# Patient Record
Sex: Female | Born: 1945 | Race: White | Hispanic: No | Smoking: Former smoker
Health system: Southern US, Community
[De-identification: ages and names within clinical notes are randomized; demographics above are authoritative.]

## PROBLEM LIST (undated history)

## (undated) DIAGNOSIS — S92909A Unspecified fracture of unspecified foot, initial encounter for closed fracture: Secondary | ICD-10-CM

## (undated) DIAGNOSIS — I10 Essential (primary) hypertension: Secondary | ICD-10-CM

## (undated) DIAGNOSIS — Z8759 Personal history of other complications of pregnancy, childbirth and the puerperium: Secondary | ICD-10-CM

## (undated) DIAGNOSIS — K635 Polyp of colon: Secondary | ICD-10-CM

## (undated) DIAGNOSIS — B159 Hepatitis A without hepatic coma: Secondary | ICD-10-CM

## (undated) DIAGNOSIS — M1990 Unspecified osteoarthritis, unspecified site: Secondary | ICD-10-CM

## (undated) DIAGNOSIS — C9000 Multiple myeloma not having achieved remission: Secondary | ICD-10-CM

## (undated) DIAGNOSIS — K219 Gastro-esophageal reflux disease without esophagitis: Secondary | ICD-10-CM

## (undated) DIAGNOSIS — T7840XA Allergy, unspecified, initial encounter: Secondary | ICD-10-CM

## (undated) DIAGNOSIS — H269 Unspecified cataract: Secondary | ICD-10-CM

## (undated) DIAGNOSIS — E871 Hypo-osmolality and hyponatremia: Secondary | ICD-10-CM

## (undated) DIAGNOSIS — G4700 Insomnia, unspecified: Secondary | ICD-10-CM

## (undated) HISTORY — DX: Unspecified cataract: H26.9

## (undated) HISTORY — DX: Polyp of colon: K63.5

## (undated) HISTORY — DX: Insomnia, unspecified: G47.00

## (undated) HISTORY — PX: COLONOSCOPY: SHX174

## (undated) HISTORY — DX: Hepatitis a without hepatic coma: B15.9

## (undated) HISTORY — DX: Unspecified osteoarthritis, unspecified site: M19.90

## (undated) HISTORY — DX: Essential (primary) hypertension: I10

## (undated) HISTORY — DX: Unspecified fracture of unspecified foot, initial encounter for closed fracture: S92.909A

## (undated) HISTORY — DX: Gastro-esophageal reflux disease without esophagitis: K21.9

## (undated) HISTORY — DX: Personal history of other complications of pregnancy, childbirth and the puerperium: Z87.59

## (undated) HISTORY — DX: Allergy, unspecified, initial encounter: T78.40XA

## (undated) HISTORY — DX: Hypo-osmolality and hyponatremia: E87.1

---
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
## 1988-12-24 LAB — CONVERTED CEMR LAB: Pap Smear: NORMAL

## 1989-12-24 HISTORY — PX: ABDOMINAL HYSTERECTOMY: SHX81

## 2001-12-24 HISTORY — PX: CHOLECYSTECTOMY: SHX55

## 2008-11-30 ENCOUNTER — Encounter: Payer: Self-pay | Admitting: Family Medicine

## 2008-12-24 HISTORY — PX: KYPHOPLASTY: SHX5884

## 2009-03-16 ENCOUNTER — Encounter: Payer: Self-pay | Admitting: Family Medicine

## 2009-12-24 HISTORY — PX: FOOT FRACTURE SURGERY: SHX645

## 2009-12-24 LAB — HM COLONOSCOPY

## 2009-12-29 ENCOUNTER — Encounter: Payer: Self-pay | Admitting: Family Medicine

## 2010-07-26 ENCOUNTER — Ambulatory Visit (HOSPITAL_COMMUNITY): Admission: RE | Admit: 2010-07-26 | Discharge: 2010-07-27 | Payer: Self-pay | Admitting: Orthopedic Surgery

## 2010-07-26 IMAGING — CR DG CHEST 2V
2 series · 2 of 2 positions shown · non-contrast
Comparison: None.

CLINICAL DATA: Left foot Lisfranc fracture, hypertension, preop
evaluation

CHEST - 2 VIEW

[view not recorded (1 of 2)]
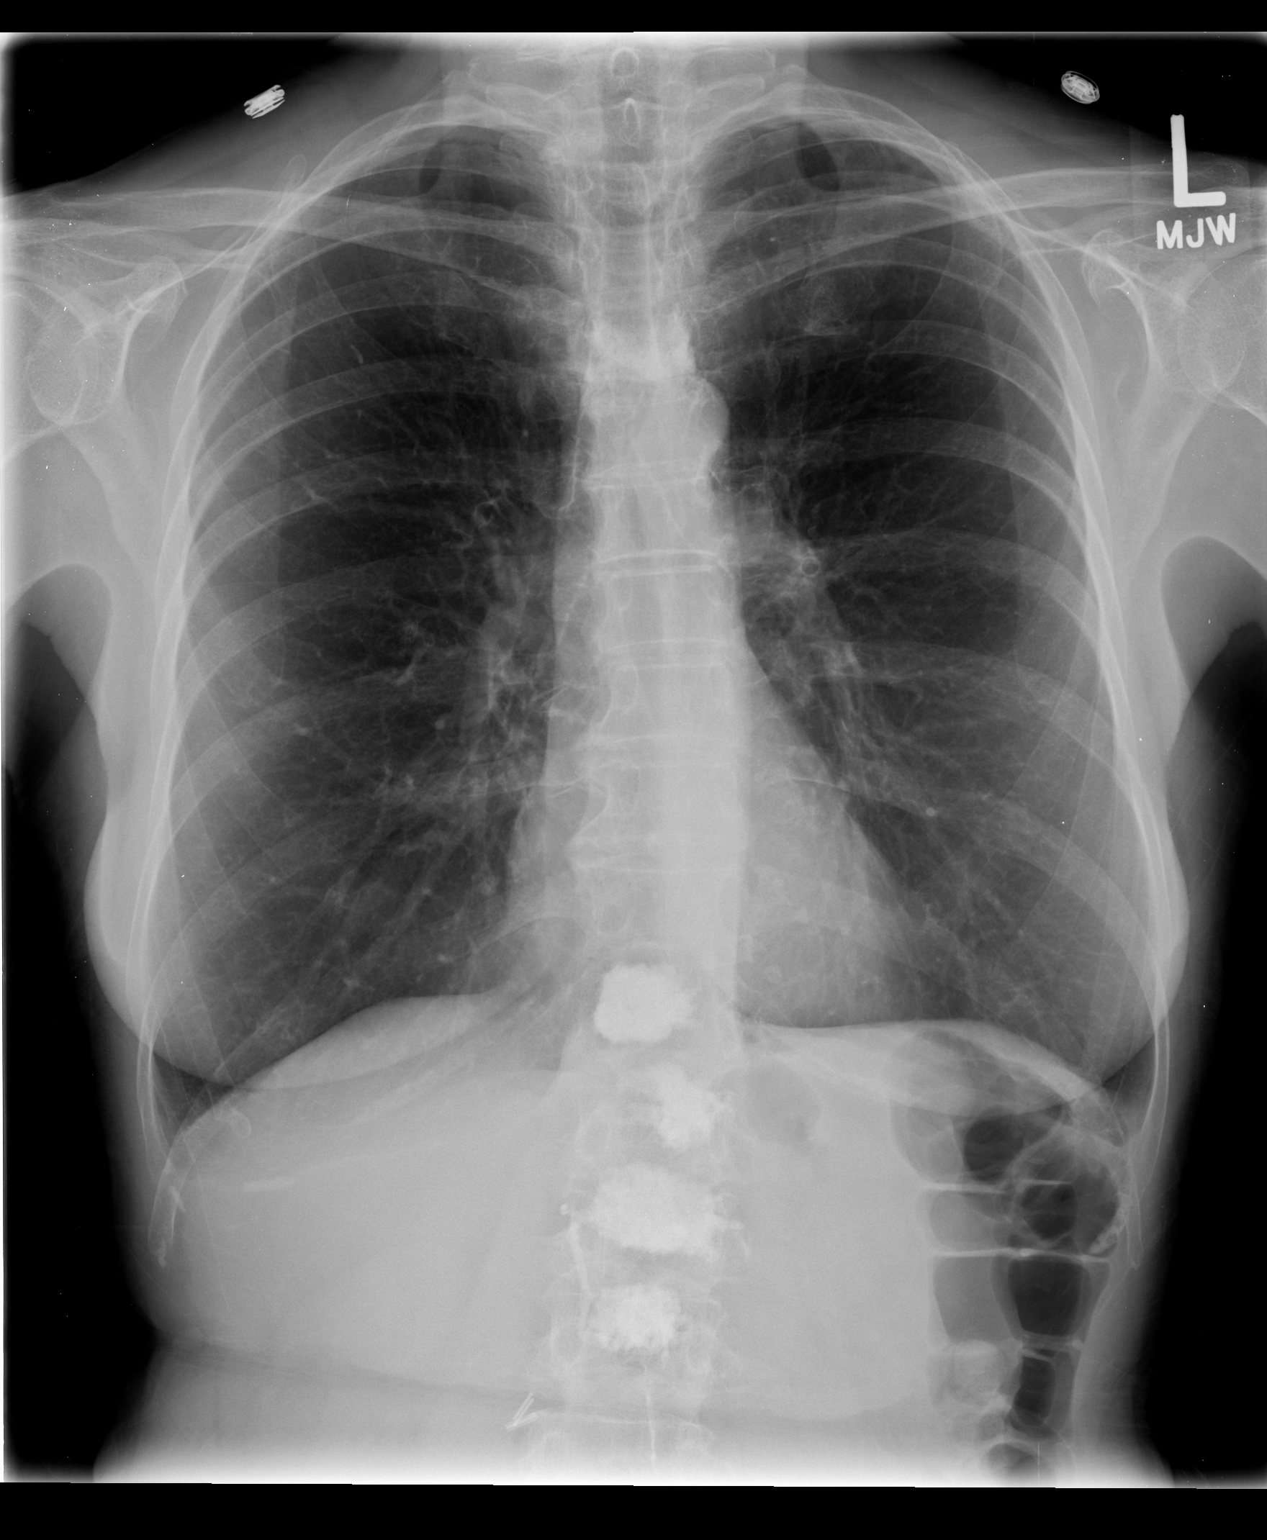

[view not recorded (2 of 2)]
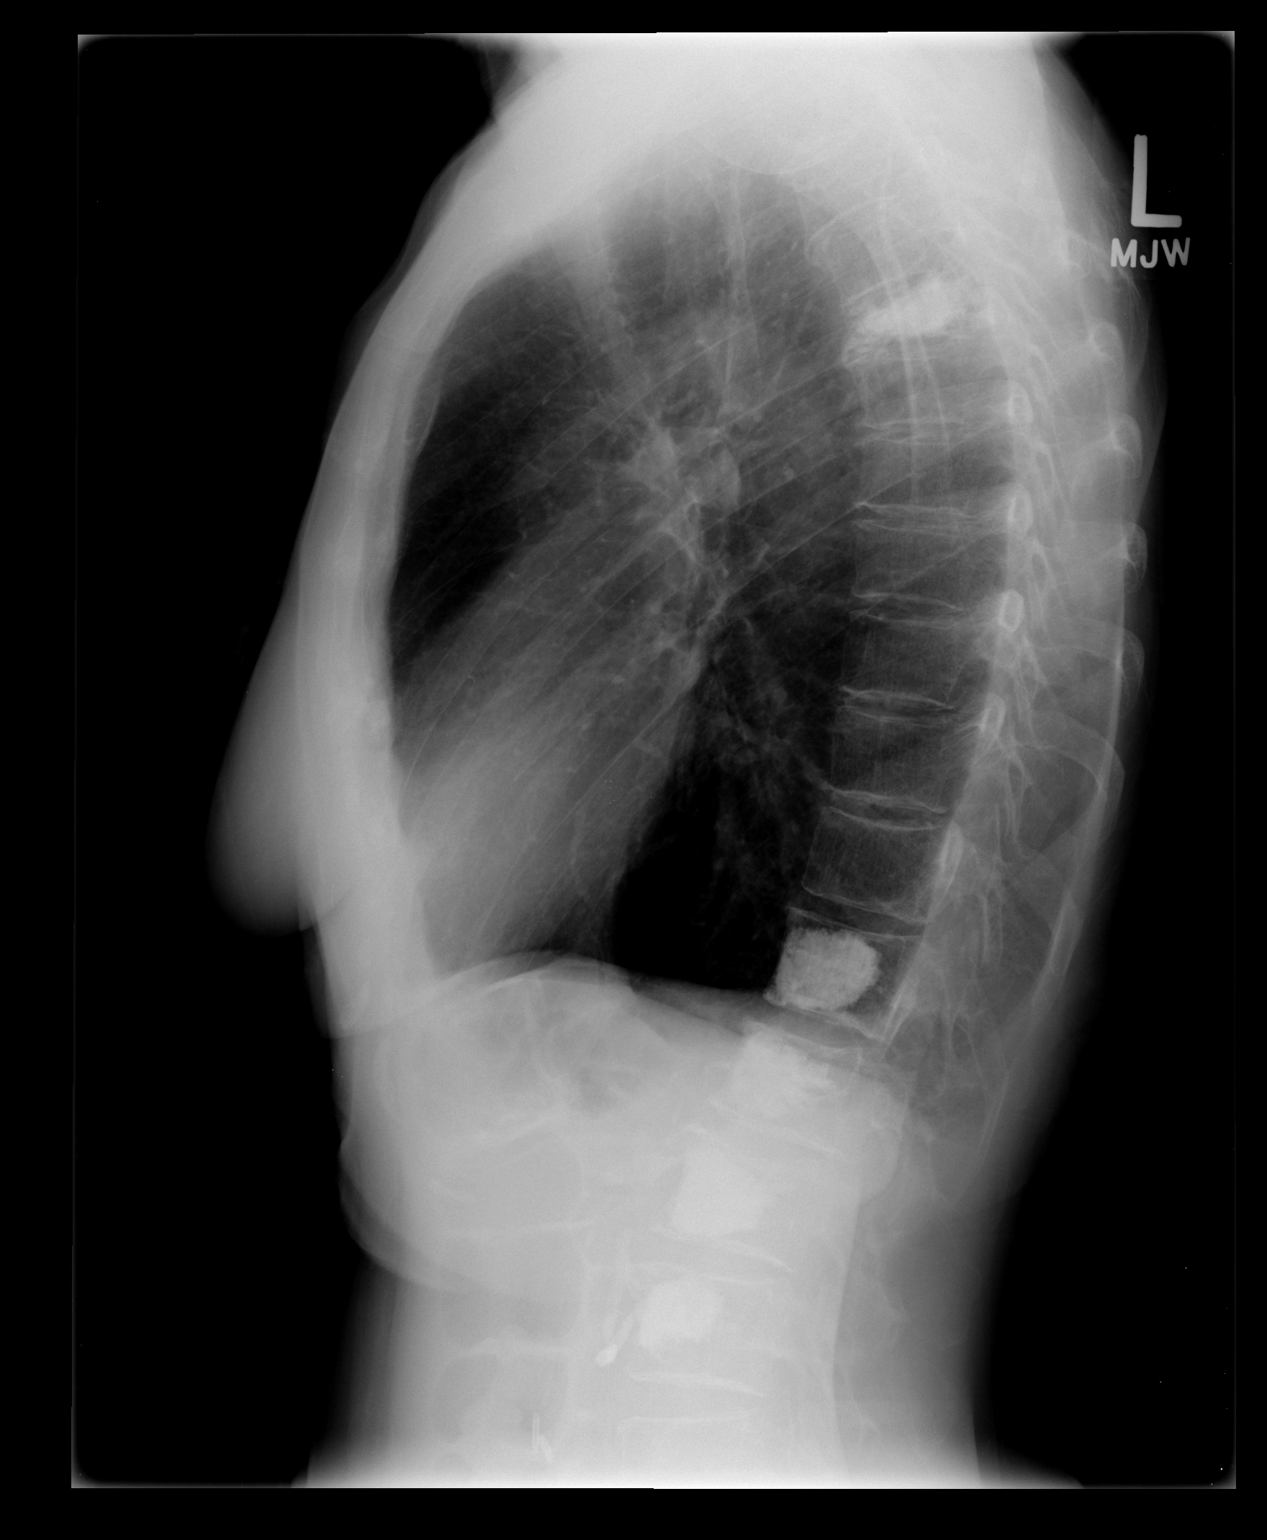

[2 of 2 positions shown; findings below may reference images not displayed]

FINDINGS: Mild hyperinflation without pneumonia, edema, effusion or
pneumothorax.  Normal heart size and vascularity.  Midline trachea.
Previous vertebral augmentation changes in the thoracic and lumbar
spine.
IMPRESSION: No acute chest process.

## 2010-08-01 ENCOUNTER — Ambulatory Visit: Payer: Self-pay | Admitting: Family Medicine

## 2010-08-01 DIAGNOSIS — I10 Essential (primary) hypertension: Secondary | ICD-10-CM | POA: Insufficient documentation

## 2010-08-01 DIAGNOSIS — M810 Age-related osteoporosis without current pathological fracture: Secondary | ICD-10-CM | POA: Insufficient documentation

## 2010-09-26 ENCOUNTER — Telehealth: Payer: Self-pay | Admitting: Family Medicine

## 2010-10-17 ENCOUNTER — Telehealth: Payer: Self-pay | Admitting: Family Medicine

## 2010-10-17 ENCOUNTER — Encounter: Payer: Self-pay | Admitting: Family Medicine

## 2010-10-26 ENCOUNTER — Ambulatory Visit: Payer: Self-pay | Admitting: Family Medicine

## 2011-01-01 ENCOUNTER — Other Ambulatory Visit: Payer: Self-pay | Admitting: Family Medicine

## 2011-01-01 ENCOUNTER — Ambulatory Visit
Admission: RE | Admit: 2011-01-01 | Discharge: 2011-01-01 | Payer: Self-pay | Source: Home / Self Care | Attending: Family Medicine | Admitting: Family Medicine

## 2011-01-01 ENCOUNTER — Encounter: Payer: Self-pay | Admitting: Family Medicine

## 2011-01-01 LAB — LIPID PANEL
Cholesterol: 212 mg/dL — ABNORMAL HIGH (ref 0–200)
HDL: 77.3 mg/dL (ref >39.00)
Total CHOL/HDL Ratio: 3
Triglycerides: 88 mg/dL (ref 0.0–149.0)
VLDL: 17.6 mg/dL (ref 0.0–40.0)

## 2011-01-01 LAB — RENAL FUNCTION PANEL
Albumin: 4.1 g/dL (ref 3.5–5.2)
BUN: 16 mg/dL (ref 6–23)
CO2: 29 mEq/L (ref 19–32)
Calcium: 9.8 mg/dL (ref 8.4–10.5)
Chloride: 94 mEq/L — ABNORMAL LOW (ref 96–112)
Creatinine, Ser: 0.7 mg/dL (ref 0.4–1.2)
GFR: 93.49 mL/min (ref >60.00)
Glucose, Bld: 89 mg/dL (ref 70–99)
Phosphorus: 3.5 mg/dL (ref 2.3–4.6)
Potassium: 4.6 mEq/L (ref 3.5–5.1)
Sodium: 131 mEq/L — ABNORMAL LOW (ref 135–145)

## 2011-01-01 LAB — CBC WITH DIFFERENTIAL/PLATELET
Basophils Absolute: 0 10*3/uL (ref 0.0–0.1)
Basophils Relative: 0.6 % (ref 0.0–3.0)
Eosinophils Absolute: 0.1 10*3/uL (ref 0.0–0.7)
Eosinophils Relative: 1 % (ref 0.0–5.0)
HCT: 37.8 % (ref 36.0–46.0)
Hemoglobin: 12.8 g/dL (ref 12.0–15.0)
Lymphocytes Relative: 27.6 % (ref 12.0–46.0)
Lymphs Abs: 2.1 10*3/uL (ref 0.7–4.0)
MCHC: 33.9 g/dL (ref 30.0–36.0)
MCV: 90.8 fl (ref 78.0–100.0)
Monocytes Absolute: 0.8 10*3/uL (ref 0.1–1.0)
Monocytes Relative: 11.2 % (ref 3.0–12.0)
Neutro Abs: 4.5 10*3/uL (ref 1.4–7.7)
Neutrophils Relative %: 59.6 % (ref 43.0–77.0)
Platelets: 412 10*3/uL — ABNORMAL HIGH (ref 150.0–400.0)
RBC: 4.17 Mil/uL (ref 3.87–5.11)
RDW: 13.6 % (ref 11.5–14.6)
WBC: 7.5 10*3/uL (ref 4.5–10.5)

## 2011-01-01 LAB — TSH: TSH: 1.34 u[IU]/mL (ref 0.35–5.50)

## 2011-01-01 LAB — HEPATIC FUNCTION PANEL
ALT: 12 U/L (ref 0–35)
AST: 22 U/L (ref 0–37)
Albumin: 4.1 g/dL (ref 3.5–5.2)
Alkaline Phosphatase: 64 U/L (ref 39–117)
Bilirubin, Direct: 0.1 mg/dL (ref 0.0–0.3)
Total Bilirubin: 0.7 mg/dL (ref 0.3–1.2)
Total Protein: 6.9 g/dL (ref 6.0–8.3)

## 2011-01-01 LAB — LDL CHOLESTEROL, DIRECT: Direct LDL: 123.2 mg/dL

## 2011-01-08 LAB — CONVERTED CEMR LAB: Vit D, 25-Hydroxy: 53 ng/mL (ref 30–89)

## 2011-01-13 ENCOUNTER — Other Ambulatory Visit: Payer: Self-pay | Admitting: Family Medicine

## 2011-01-13 DIAGNOSIS — Z1231 Encounter for screening mammogram for malignant neoplasm of breast: Secondary | ICD-10-CM

## 2011-01-19 ENCOUNTER — Telehealth: Payer: Self-pay | Admitting: Family Medicine

## 2011-01-23 NOTE — Progress Notes (Signed)
Summary: refill request for ambien  Phone Note Refill Request Message from:  Fax from Pharmacy  Refills Requested: Medication #1:  ZOLPIDEM TARTRATE 10 MG TABS take one tablet by mouth daily   Last Refilled: 08/29/2010 Faxed request from [place], [phone].  Initial call taken by: Lowella Petties CMA,  September 26, 2010 3:22 PM  Follow-up for Phone Call        px written on EMR for call in  Follow-up by: Judith Part MD,  September 26, 2010 3:46 PM  Additional Follow-up for Phone Call Additional follow up Details #1::        Medication phoned to [facility]  pharmacy as instructed. Lewanda Rife LPN  September 26, 2010 4:26 PM     New/Updated Medications: ZOLPIDEM TARTRATE 10 MG TABS (ZOLPIDEM TARTRATE) take one tablet by mouth daily Prescriptions: ZOLPIDEM TARTRATE 10 MG TABS (ZOLPIDEM TARTRATE) take one tablet by mouth daily  #30 x 5   Entered and Authorized by:   Judith Part MD   Signed by:   Lewanda Rife LPN on 45/40/9811   Method used:   Telephoned to ...         RxID:   [id number]

## 2011-01-23 NOTE — Progress Notes (Signed)
Summary: refill request for alclomatasone ointment  Phone Note Refill Request Message from:  Fax from Pharmacy  Refills Requested: Medication #1:  alclometasone diprop 0.05% topical ointment   Last Refilled: 12/24/2008 faxed request from [facility], this is not on med list.  Initial call taken by: Lowella Petties CMA, AAMA,  October 17, 2010 12:21 PM  Follow-up for Phone Call        not on med list- please check in with pt  who is it from? what is it for  Follow-up by: Judith Part MD,  October 17, 2010 1:36 PM  Additional Follow-up for Phone Call Additional follow up Details #1::        Patient notified as instructed by telephone. Pt said years ago she was dehydrated and her lips chapped badly.  A dermatologist gave her samples of this med and a dr in [place] filled it also. Pt uses for severe chapped lips and applies once daily as needed for chapped lips. Pt will ck with [facility] tomorrow.Lewanda Rife LPN  October 17, 2010 2:57 PM     Additional Follow-up for Phone Call Additional follow up Details #2::    I cannot find med in the database -- it is steroid  ok to refil times one  may need to free text it into med list apply to lips once daily as needed severe chapping -- do not use longer than 1 week 1 small ,  no ref  Follow-up by: Judith Part MD,  October 17, 2010 3:16 PM  Additional Follow-up for Phone Call Additional follow up Details #3:: Details for Additional Follow-up Action Taken: Medication phoned to [facility]  pharmacy as instructed. Med list updated.Lewanda Rife LPN  October 17, 2010 4:19 PM
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
## 2011-01-23 NOTE — Letter (Signed)
Summary: Dr[facility]F.Ke[facility]  [facility]   Imported By: Beau Fanny 08/07/2010 15:37:29  _____________________________________________________________________  External Attachment:    Type:   Image     Comment:   External Document

## 2011-01-23 NOTE — Miscellaneous (Signed)
Summary: Alclometasone Diprop 0.05% ointment  Clinical Lists Changes  Medications: Added new medication of ALCLOMETASONE DIPROPIONATE 0.05 % OINT (ALCLOMETASONE DIPROPIONATE) Apply to lips once daily as needed for severe chapping. Do not use longer than 1 week - Signed Rx of ALCLOMETASONE DIPROPIONATE 0.05 % OINT (ALCLOMETASONE DIPROPIONATE) Apply to lips once daily as needed for severe chapping. Do not use longer than 1 week;  #1 small x 0;  Signed;  Entered by: Lewanda Rife LPN;  Authorized by: Judith Part MD;  Method used: Telephoned to [facility]*, [address], [place], [place]  [address], Ph: [phone], Fax: [phone]    Prescriptions: ALCLOMETASONE DIPROPIONATE 0.05 % OINT (ALCLOMETASONE DIPROPIONATE) Apply to lips once daily as needed for severe chapping. Do not use longer than 1 week  #1 small x 0   Entered by:   Lewanda Rife LPN   Authorized by:   Judith Part MD   Signed by:   Lewanda Rife LPN on 29/52/8413   Method used:   Telephoned to ...       [facility]* (retail)       [address]       [place], [place]  [address]       Ph: [phone]       Fax: [phone]   RxID:   [id number]   Current Allergies: ! PENICILLIN ! BENADRYL ! * ZANAFLEX ! * FIORINAL ! * ANTIHISTAMINES ! * DECONGESTANTS ! * ANTI INFLAMMATORIES

## 2011-01-23 NOTE — Letter (Signed)
Summary: Patient Questionnaire  Patient Questionnaire   Imported By: Beau Fanny 08/02/2010 13:09:08  _____________________________________________________________________  External Attachment:    Type:   Image     Comment:   External Document

## 2011-01-25 NOTE — Progress Notes (Signed)
Summary: tramadol   Phone Note Refill Request Message from:  Fax from Pharmacy on January 19, 2011 10:32 AM  Refills Requested: Medication #1:  TRAMADOL HCL 50 MG TABS take two  tablets by mouth every 4 - 6 hours  as needed pain   Last Refilled: 11/23/2010 Refill request from [facility] [phone].    Follow-up for Phone Call        px written on EMR for call in please make sure she is not also getting this med from any other physician-- for example ortho since it is a controlled substance   Follow-up by: Judith Part MD,  January 19, 2011 11:25 AM  Additional Follow-up for Phone Call Additional follow up Details #1::        Medication phoned to [facility] pharmacy as instructed. Spoke with Casie at [facility] and she said pt has not gotten since 11/24/11.Lewanda Rife LPN  January 19, 2011 12:48 PM     New/Updated Medications: TRAMADOL HCL 50 MG TABS (TRAMADOL HCL) take 1-2  by mouth every 6 hours as needed pain Prescriptions: TRAMADOL HCL 50 MG TABS (TRAMADOL HCL) take 1-2  by mouth every 6 hours as needed pain  #60 x 0   Entered and Authorized by:   Judith Part MD   Signed by:   Lewanda Rife LPN on 19/14/7829   Method used:   Telephoned to ...         RxID:   [id number]
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
## 2011-01-31 NOTE — Progress Notes (Signed)
Summary: Pt has question about HCTZ and Lotrel  Phone Note Refill Request Call back at Home Phone [phone] Message from:  Patient  Refills Requested: Medication #1:  TRAMADOL HCL 50 MG TABS take 1-2  by mouth every 6 hours as needed pain Pt states the 60 that she got today will not last her a month, she says she takes 2 every 6-8 hours.  Her previous doctor always gave her 240.  Uses [facility].  Also she says she takes hctz 25 mg's, one a day,  which is not on her med list, she says she forgot to write it down.  She is asking that a 90 day supply of that be sent to [facility] as well.  Initial call taken by: Lowella Petties CMA, AAMA,  January 19, 2011 3:14 PM  Follow-up for Phone Call        please ask her how many tramadol she takes per day average  (I know it may vary) -- just want to make sure she does not get into toxic range- then I will refil --thanks  also which pain is she taking it for because I like to note that on the bottle  Follow-up by: Judith Part MD,  January 19, 2011 3:46 PM  Additional Follow-up for Phone Call Additional follow up Details #1::        Patient notified as instructed by telephone. Pt takes 5-8 tablets a day. Pt said usually average is 6 aday but she has taken as many as 8 a day. Pt taking for back pain and foot pain.Lewanda Rife LPN  January 19, 2011 4:58 PM   Dr Milinda Antis said [place] to fill for #240. Medication Tramadol #180 phoned to [facility] pharmacy as instructed. Patient notified as instructed by telephone. Pt also wants to ask Dr Milinda Antis on [address] to call in to [facility] a 90 day supply on HCTZ and Lotrel. Please see note above.Lewanda Rife LPN  January 19, 2011 5:14 PM     Additional Follow-up for Phone Call Additional follow up Details #2::    Lotrel and HCTZ called to [facility] pharmacy as instructed.Patient notified as instructed by telephone. Lewanda Rife LPN  January 22, 2011 3:17 PM   New/Updated Medications: HYDROCHLOROTHIAZIDE 25 MG TABS (HYDROCHLOROTHIAZIDE) 1  by mouth once daily Prescriptions: LOTREL 10-20 MG CAPS (AMLODIPINE BESY-BENAZEPRIL HCL) take one tablet by mouth daily  #90 x 3   Entered and Authorized by:   Judith Part MD   Signed by:   Lewanda Rife LPN on 91/47/8295   Method used:   Telephoned to ...       [facility]* (retail)       [address]       [place], [place]  [address]       Ph: [phone]       Fax: [phone]   RxID:   [id number] HYDROCHLOROTHIAZIDE 25 MG TABS (HYDROCHLOROTHIAZIDE) 1 by mouth once daily  #90 x 3   Entered and Authorized by:   Judith Part MD   Signed by:   Lewanda Rife LPN on 42/59/5638   Method used:   Telephoned to ...       [facility]* (retail)       [address]       [place], [place]  [address]       Ph: [phone]       Fax: [phone]   RxID:   [id number] TRAMADOL HCL 50 MG TABS (TRAMADOL HCL) take 1-2  by  mouth every 6 hours as needed pain  #240 x 0   Entered by:   Lewanda Rife LPN   Authorized by:   Judith Part MD   Signed by:   Lewanda Rife LPN on 04/54/0981   Method used:   Telephoned to ...       [facility]* (retail)       [address]       [place], [place]  [address]       Ph: [phone]       Fax: [phone]   RxID:   [id number]

## 2011-02-02 ENCOUNTER — Ambulatory Visit
Admission: RE | Admit: 2011-02-02 | Discharge: 2011-02-02 | Disposition: A | Discharge status: Home / Self Care | Payer: Medicare Other | Source: Ambulatory Visit | Attending: Family Medicine | Admitting: Family Medicine

## 2011-02-02 DIAGNOSIS — Z1231 Encounter for screening mammogram for malignant neoplasm of breast: Secondary | ICD-10-CM

## 2011-02-02 LAB — HM MAMMOGRAPHY: HM Mammogram: NORMAL

## 2011-02-07 ENCOUNTER — Encounter (INDEPENDENT_AMBULATORY_CARE_PROVIDER_SITE_OTHER): Payer: Self-pay | Admitting: *Deleted

## 2011-02-12 ENCOUNTER — Other Ambulatory Visit (INDEPENDENT_AMBULATORY_CARE_PROVIDER_SITE_OTHER): Payer: Medicare Other

## 2011-02-12 ENCOUNTER — Encounter (INDEPENDENT_AMBULATORY_CARE_PROVIDER_SITE_OTHER): Payer: Self-pay | Admitting: *Deleted

## 2011-02-12 ENCOUNTER — Other Ambulatory Visit: Payer: Self-pay | Admitting: Family Medicine

## 2011-02-12 DIAGNOSIS — E871 Hypo-osmolality and hyponatremia: Secondary | ICD-10-CM

## 2011-02-12 LAB — SODIUM: Sodium: 130 mEq/L — ABNORMAL LOW (ref 135–145)

## 2011-02-14 NOTE — Letter (Signed)
Summary: Results Follow up Letter  [place] at [facility]  [address] [place], [place] [address]   Phone: [phone]  Fax: [phone]    02/07/2011 MRN: [id number]  Wendy [name] [address] [place], [place]  [address]  Dear Ms. Symonette,  The following are the results of your recent test(s):  Test         Result    Pap Smear:        Normal _____  Not Normal _____ Comments: ______________________________________________________ Cholesterol: LDL(Bad cholesterol):         Your goal is less than:         HDL (Good cholesterol):       Your goal is more than: Comments:  ______________________________________________________ Mammogram:        Normal __X___  Not Normal _____ Comments: Repeat in 1 year  ___________________________________________________________________ Hemoccult:        Normal _____  Not normal _______ Comments:    _____________________________________________________________________ Other Tests:    We routinely do not discuss normal results over the telephone.  If you desire a copy of the results, or you have any questions about this information we can discuss them at your next office visit.   Sincerely,       Sharilyn Sites for Dr. Roxy Manns

## 2011-02-14 NOTE — Miscellaneous (Signed)
Summary: Mammogram to flowsheet  Clinical Lists Changes  Observations: Added new observation of MAMMO DUE: 01/2012 (02/07/2011 12:15) Added new observation of MAMMOGRAM: normal (02/02/2011 12:15)      Preventive Care Screening  Mammogram:    Date:  02/02/2011    Next Due:  01/2012    Results:  normal

## 2011-03-07 ENCOUNTER — Encounter: Payer: Self-pay | Admitting: Family Medicine

## 2011-03-07 ENCOUNTER — Other Ambulatory Visit: Payer: Self-pay | Admitting: Family Medicine

## 2011-03-07 ENCOUNTER — Ambulatory Visit (INDEPENDENT_AMBULATORY_CARE_PROVIDER_SITE_OTHER): Payer: Medicare Other | Admitting: Family Medicine

## 2011-03-07 DIAGNOSIS — I10 Essential (primary) hypertension: Secondary | ICD-10-CM

## 2011-03-07 DIAGNOSIS — E871 Hypo-osmolality and hyponatremia: Secondary | ICD-10-CM

## 2011-03-07 LAB — BASIC METABOLIC PANEL
BUN: 13 mg/dL (ref 6–23)
CO2: 28 mEq/L (ref 19–32)
Calcium: 9.2 mg/dL (ref 8.4–10.5)
Chloride: 101 mEq/L (ref 96–112)
Creatinine, Ser: 0.8 mg/dL (ref 0.4–1.2)
GFR: 75.06 mL/min (ref >60.00)
Glucose, Bld: 93 mg/dL (ref 70–99)
Potassium: 4.7 mEq/L (ref 3.5–5.1)
Sodium: 136 mEq/L (ref 135–145)

## 2011-03-09 LAB — CBC
HCT: 36.6 % (ref 36.0–46.0)
Hemoglobin: 13 g/dL (ref 12.0–15.0)
MCH: 31.4 pg (ref 26.0–34.0)
MCHC: 35.5 g/dL (ref 30.0–36.0)
MCV: 88.4 fL (ref 78.0–100.0)
Platelets: 303 10*3/uL (ref 150–400)
RBC: 4.14 MIL/uL (ref 3.87–5.11)
RDW: 12.9 % (ref 11.5–15.5)
WBC: 6.7 10*3/uL (ref 4.0–10.5)

## 2011-03-09 LAB — APTT: aPTT: 35 seconds (ref 24–37)

## 2011-03-09 LAB — COMPREHENSIVE METABOLIC PANEL
ALT: 15 U/L (ref 0–35)
AST: 29 U/L (ref 0–37)
Albumin: 4.3 g/dL (ref 3.5–5.2)
Alkaline Phosphatase: 58 U/L (ref 39–117)
BUN: 13 mg/dL (ref 6–23)
CO2: 29 mEq/L (ref 19–32)
Calcium: 9.4 mg/dL (ref 8.4–10.5)
Chloride: 90 mEq/L — ABNORMAL LOW (ref 96–112)
Creatinine, Ser: 0.92 mg/dL (ref 0.4–1.2)
GFR calc Af Amer: >60 mL/min (ref >60)
GFR calc non Af Amer: >60 mL/min (ref >60)
Glucose, Bld: 108 mg/dL — ABNORMAL HIGH (ref 70–99)
Potassium: 3.3 mEq/L — ABNORMAL LOW (ref 3.5–5.1)
Sodium: 127 mEq/L — ABNORMAL LOW (ref 135–145)
Total Bilirubin: 0.8 mg/dL (ref 0.3–1.2)
Total Protein: 6.9 g/dL (ref 6.0–8.3)

## 2011-03-09 LAB — SURGICAL PCR SCREEN
MRSA, PCR: NEGATIVE
Staphylococcus aureus: NEGATIVE

## 2011-03-09 LAB — PROTIME-INR
INR: 0.97 (ref 0.00–1.49)
Prothrombin Time: 13.1 seconds (ref 11.6–15.2)
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
## 2011-03-26 ENCOUNTER — Other Ambulatory Visit: Payer: Self-pay

## 2011-03-26 DIAGNOSIS — M1990 Unspecified osteoarthritis, unspecified site: Secondary | ICD-10-CM

## 2011-03-26 DIAGNOSIS — IMO0002 Reserved for concepts with insufficient information to code with codable children: Secondary | ICD-10-CM

## 2011-03-26 NOTE — Telephone Encounter (Signed)
Please verify how many pills pt is needing per day and for what pain (before I refil it )- thanks

## 2011-03-26 NOTE — Telephone Encounter (Signed)
Can you verify with patient how many tramadol she is needing  average per day ?  Thanks

## 2011-03-27 NOTE — Telephone Encounter (Signed)
Left message for pt to call back  °

## 2011-03-28 DIAGNOSIS — M1990 Unspecified osteoarthritis, unspecified site: Secondary | ICD-10-CM | POA: Insufficient documentation

## 2011-03-28 DIAGNOSIS — M5136 Other intervertebral disc degeneration, lumbar region: Secondary | ICD-10-CM | POA: Insufficient documentation

## 2011-03-28 MED ORDER — TRAMADOL HCL 50 MG PO TABS: ORAL_TABLET | ORAL | Status: DC

## 2011-03-28 NOTE — Telephone Encounter (Signed)
Pt states that she has degenerative disc disease and worn hip and knee joints. Dampness of weather makes pain worse. Pt averages 4-6 Tramadol daily. Pt will ck with [facility] later today for refill.

## 2011-03-28 NOTE — Telephone Encounter (Signed)
Patient notified as instructed by telephone. Pt said she would try to limit to 4 tramadol or less a day. Medication phoned to [facility] pharmacy as instructed.

## 2011-03-28 NOTE — Telephone Encounter (Signed)
Thanks for the update I really want her to limit to 4 per day -- this can be habit forming  Px written for call in

## 2011-04-03 ENCOUNTER — Ambulatory Visit
Admission: RE | Admit: 2011-04-03 | Discharge: 2011-04-03 | Disposition: A | Discharge status: Home / Self Care | Payer: Medicare Other | Source: Ambulatory Visit | Attending: Family Medicine | Admitting: Family Medicine

## 2011-04-03 LAB — HM DEXA SCAN

## 2011-04-09 ENCOUNTER — Telehealth: Payer: Self-pay

## 2011-04-09 NOTE — Telephone Encounter (Signed)
Patient notified as instructed by telephone.  Pt was very appreciative. Spoke with April at [facility] and she said unless new rx she cannot change instructions on old rx because there are no refills. Will need to update instructions on next refill of Tramadol.

## 2011-04-09 NOTE — Telephone Encounter (Signed)
Ok- then I am going to change inst to 2 po up to bid (max 4 per day then )  I made change in the directions -- please alert pharmacy Did not refil yet, however-thanks

## 2011-04-09 NOTE — Telephone Encounter (Signed)
Pt does not need refill on Tramadol now but wanted to get directions changed in computer before she needs another refill. Pt said she has tried taking 1 Tramadol 50mg  and it does not help. Pt said previously rx was written Tramadol 50mg  taking 2 tablets by mouth every 4-6 hours as needed for pain. Pt said she takes for osteoarthritis (joint pain) and depending on weather (damp or cold) pt said she might take 4 tablets in 24 hour period. Pt states she usually takes 2 tablets daily. Pt said if Dr Milinda Antis is agreeable to change instructions for next refill I do not need to call pt back. If Dr Milinda Antis is unable to change directions please call pt back. (home [phone] or cell [phone]) Thank you.

## 2011-04-19 ENCOUNTER — Other Ambulatory Visit: Payer: Self-pay | Admitting: *Deleted

## 2011-04-19 ENCOUNTER — Telehealth: Payer: Self-pay

## 2011-04-19 MED ORDER — ZOLPIDEM TARTRATE 10 MG PO TABS: 10.0000 mg | ORAL_TABLET | Freq: Every evening | ORAL | Status: DC | PRN

## 2011-04-19 NOTE — Telephone Encounter (Signed)
Medication phoned to [facility] pharmacy as instructed.  

## 2011-04-19 NOTE — Telephone Encounter (Signed)
Letter mailed to pt with Bone density results as instructed. Health maintenance was updated.

## 2011-04-19 NOTE — Telephone Encounter (Signed)
Px written for call in   

## 2011-05-01 ENCOUNTER — Other Ambulatory Visit: Payer: Self-pay | Admitting: *Deleted

## 2011-05-01 DIAGNOSIS — M1990 Unspecified osteoarthritis, unspecified site: Secondary | ICD-10-CM

## 2011-05-01 NOTE — Telephone Encounter (Signed)
Please verify with her on average how many pills a day she needs and for which pain- thanks

## 2011-05-01 NOTE — Telephone Encounter (Signed)
Please verify how many pills on average she needs per day and for what pain-thanks

## 2011-05-02 NOTE — Telephone Encounter (Signed)
Left message on pt's home and cell phone   for pt to call back.

## 2011-05-03 MED ORDER — TRAMADOL HCL 50 MG PO TABS: 100.0000 mg | ORAL_TABLET | Freq: Two times a day (BID) | ORAL | Status: DC | PRN

## 2011-05-03 NOTE — Telephone Encounter (Signed)
Thanks- let her know that because it is a controlled substance I need to keep a tight eye on how much she is taking - as this can change (this also gives me a heads up as to how her pain is) -- tell her that she often will get a call before refils  I like to stay on top of it  Also with new EMR it is like starting over with meds- so I have to do this a lot

## 2011-05-03 NOTE — Telephone Encounter (Signed)
Patient notified as instructed by telephone.Medication phoned to [facility] as instructed.  

## 2011-05-03 NOTE — Telephone Encounter (Signed)
Patient notified as instructed by telephone. Pt said she usually takes 4 pills per day. If pain is not severe she may only take 2 daily. Pt uses the med for osteoarthritis and generalized joint pain. Pt thought this was taken care of and she said she gets a call each time she request a refill. I apologized to pt and explained we are just trying to take care of pt's needs.Please advise.

## 2011-05-22 ENCOUNTER — Other Ambulatory Visit: Payer: Self-pay | Admitting: *Deleted

## 2011-05-22 MED ORDER — ZOLPIDEM TARTRATE 10 MG PO TABS: 10.0000 mg | ORAL_TABLET | Freq: Every evening | ORAL | Status: DC | PRN

## 2011-05-22 NOTE — Telephone Encounter (Signed)
Pt needs to pick this up this afternoon if possible, she says she has been  Waiting on it, says we got the request on Thursday, I told her there is nothing in her chart to indicate that.

## 2011-05-22 NOTE — Telephone Encounter (Signed)
Rx called in as directed.   

## 2011-05-22 NOTE — Telephone Encounter (Signed)
Px written for call in  Be sure to reiterate we got no request - so she needs to talk to her pharmacy about that

## 2011-09-24 ENCOUNTER — Other Ambulatory Visit: Payer: Self-pay | Admitting: *Deleted

## 2011-09-24 MED ORDER — TRAMADOL HCL 50 MG PO TABS: 100.0000 mg | ORAL_TABLET | Freq: Two times a day (BID) | ORAL | Status: DC | PRN

## 2011-09-24 NOTE — Telephone Encounter (Signed)
Last refill 08/16/2011. 

## 2011-09-24 NOTE — Telephone Encounter (Signed)
Medication phoned to [facility] pharmacy as instructed.  

## 2011-09-24 NOTE — Telephone Encounter (Signed)
Px written for call in   

## 2011-10-31 ENCOUNTER — Other Ambulatory Visit: Payer: Self-pay

## 2011-10-31 MED ORDER — TRAMADOL HCL 50 MG PO TABS: 100.0000 mg | ORAL_TABLET | Freq: Two times a day (BID) | ORAL | Status: DC | PRN

## 2011-10-31 NOTE — Telephone Encounter (Signed)
Px written for call in   

## 2011-10-31 NOTE — Telephone Encounter (Signed)
[facility] faxed refill request Tramadol 50 mg. Last filled 09/25/11. Spoke with Rob at [facility] and he said pt will pick up on 11/01/11.

## 2011-11-02 ENCOUNTER — Telehealth: Payer: Self-pay

## 2011-11-02 NOTE — Telephone Encounter (Signed)
Patient notified as instructed by telephone. 

## 2011-11-02 NOTE — Telephone Encounter (Signed)
Pt has to make a decision about insurance coverage soon and pt said had dexa 04/03/11 and was told showed osteoporosis with hip slightly better and to continue calcium with vitamin D then would discuss at f/u visit. Pt has appt with Dr Milinda Antis in Jan 2013. Pt has taken Forteo in the past and wonders if Dr Milinda Antis thinks she may need another med ordered for osteoporosis that might be expensive. If so she will chose the insurance plan with the better drug coverage. Please advise. Pt can be reached at [phone].

## 2011-11-02 NOTE — Telephone Encounter (Signed)
Good question Given that she just finished forteo and has been on other meds (fosamax/ actonel/ boniva) - I will probably recommend a rest before trying anything else -- (forteo is one of the most $$)  So don't base insurance upon that at this time

## 2011-11-12 ENCOUNTER — Other Ambulatory Visit: Payer: Self-pay

## 2011-11-12 MED ORDER — ZOLPIDEM TARTRATE 10 MG PO TABS: 10.0000 mg | ORAL_TABLET | Freq: Every day | ORAL | Status: DC

## 2011-11-12 NOTE — Telephone Encounter (Signed)
[facility] faxed refill request for Zolpidem 10 mg. Last filled 10/12/11. Pt last seen 03/07/11.

## 2011-11-12 NOTE — Telephone Encounter (Signed)
Rx called in as directed.   

## 2011-11-12 NOTE — Telephone Encounter (Signed)
Px written for call in   

## 2011-12-11 ENCOUNTER — Other Ambulatory Visit: Payer: Self-pay

## 2011-12-11 MED ORDER — TRAMADOL HCL 50 MG PO TABS
100.0000 mg | ORAL_TABLET | Freq: Two times a day (BID) | ORAL | Status: DC | PRN
Start: 2011-12-11 — End: 2012-03-17

## 2011-12-11 NOTE — Telephone Encounter (Signed)
[facility] faxed refill request for Tramadol 50 mg. Last filled 10/31/11 and pt last seen 01/01/11.Please advise.

## 2011-12-11 NOTE — Telephone Encounter (Signed)
She needs f/u with me in march please  Will refill electronically

## 2011-12-14 NOTE — Telephone Encounter (Signed)
Called mobile # it was wrong number, called home # and left message on machine for pt to return call.

## 2011-12-24 ENCOUNTER — Encounter: Payer: Self-pay | Admitting: Family Medicine

## 2012-01-14 ENCOUNTER — Other Ambulatory Visit: Payer: Self-pay | Admitting: *Deleted

## 2012-01-14 MED ORDER — AMLODIPINE BESY-BENAZEPRIL HCL 10-20 MG PO CAPS: 1.0000 | ORAL_CAPSULE | Freq: Every day | ORAL | Status: DC

## 2012-03-06 ENCOUNTER — Telehealth (INDEPENDENT_AMBULATORY_CARE_PROVIDER_SITE_OTHER): Payer: Medicare Other | Admitting: Family Medicine

## 2012-03-06 ENCOUNTER — Other Ambulatory Visit (INDEPENDENT_AMBULATORY_CARE_PROVIDER_SITE_OTHER): Payer: Medicare Other

## 2012-03-06 DIAGNOSIS — M810 Age-related osteoporosis without current pathological fracture: Secondary | ICD-10-CM

## 2012-03-06 DIAGNOSIS — I10 Essential (primary) hypertension: Secondary | ICD-10-CM

## 2012-03-06 DIAGNOSIS — E871 Hypo-osmolality and hyponatremia: Secondary | ICD-10-CM

## 2012-03-06 LAB — CBC WITH DIFFERENTIAL/PLATELET
Basophils Absolute: 0 10*3/uL (ref 0.0–0.1)
Basophils Relative: 0.7 % (ref 0.0–3.0)
Eosinophils Absolute: 0.1 10*3/uL (ref 0.0–0.7)
Eosinophils Relative: 2.4 % (ref 0.0–5.0)
HCT: 38.2 % (ref 36.0–46.0)
Hemoglobin: 12.7 g/dL (ref 12.0–15.0)
Lymphocytes Relative: 34.5 % (ref 12.0–46.0)
Lymphs Abs: 1.9 10*3/uL (ref 0.7–4.0)
MCHC: 33.2 g/dL (ref 30.0–36.0)
MCV: 92.4 fl (ref 78.0–100.0)
Monocytes Absolute: 0.6 10*3/uL (ref 0.1–1.0)
Monocytes Relative: 10.7 % (ref 3.0–12.0)
Neutro Abs: 2.9 10*3/uL (ref 1.4–7.7)
Neutrophils Relative %: 51.7 % (ref 43.0–77.0)
Platelets: 339 10*3/uL (ref 150.0–400.0)
RBC: 4.13 Mil/uL (ref 3.87–5.11)
RDW: 13.9 % (ref 11.5–14.6)
WBC: 5.6 10*3/uL (ref 4.5–10.5)

## 2012-03-06 LAB — COMPREHENSIVE METABOLIC PANEL
ALT: 12 U/L (ref 0–35)
AST: 22 U/L (ref 0–37)
Albumin: 4.2 g/dL (ref 3.5–5.2)
Alkaline Phosphatase: 55 U/L (ref 39–117)
BUN: 13 mg/dL (ref 6–23)
CO2: 27 mEq/L (ref 19–32)
Calcium: 9.3 mg/dL (ref 8.4–10.5)
Chloride: 102 mEq/L (ref 96–112)
Creatinine, Ser: 0.9 mg/dL (ref 0.4–1.2)
GFR: 64.61 mL/min (ref >60.00)
Glucose, Bld: 62 mg/dL — ABNORMAL LOW (ref 70–99)
Potassium: 4.3 mEq/L (ref 3.5–5.1)
Sodium: 136 mEq/L (ref 135–145)
Total Bilirubin: 0.4 mg/dL (ref 0.3–1.2)
Total Protein: 7 g/dL (ref 6.0–8.3)

## 2012-03-06 LAB — LDL CHOLESTEROL, DIRECT: Direct LDL: 86.1 mg/dL

## 2012-03-06 LAB — LIPID PANEL
Cholesterol: 213 mg/dL — ABNORMAL HIGH (ref 0–200)
HDL: 100 mg/dL (ref >39.00)
Total CHOL/HDL Ratio: 2
Triglycerides: 74 mg/dL (ref 0.0–149.0)
VLDL: 14.8 mg/dL (ref 0.0–40.0)

## 2012-03-06 LAB — TSH: TSH: 2.46 u[IU]/mL (ref 0.35–5.50)

## 2012-03-06 NOTE — Telephone Encounter (Signed)
Message copied by Judy Pimple on Thu Mar 06, 2012  7:52 AM ------      Message from: Alvina Chou      Created: Mon Mar 03, 2012 11:11 AM      Regarding: labs for 03-06-12       Patient is scheduled for CPX labs, please order future labs, Thanks , Camelia Eng

## 2012-03-07 LAB — VITAMIN D 25 HYDROXY (VIT D DEFICIENCY, FRACTURES): Vit D, 25-Hydroxy: 62 ng/mL (ref 30–89)

## 2012-03-13 ENCOUNTER — Other Ambulatory Visit: Payer: Self-pay | Admitting: *Deleted

## 2012-03-13 MED ORDER — ZOLPIDEM TARTRATE 10 MG PO TABS: 10.0000 mg | ORAL_TABLET | Freq: Every day | ORAL | Status: DC

## 2012-03-13 NOTE — Telephone Encounter (Signed)
Rx called to [facility]. 

## 2012-03-13 NOTE — Telephone Encounter (Signed)
Px written for call in   

## 2012-03-14 ENCOUNTER — Other Ambulatory Visit: Payer: Self-pay | Admitting: Family Medicine

## 2012-03-14 DIAGNOSIS — Z1231 Encounter for screening mammogram for malignant neoplasm of breast: Secondary | ICD-10-CM

## 2012-03-17 ENCOUNTER — Ambulatory Visit (INDEPENDENT_AMBULATORY_CARE_PROVIDER_SITE_OTHER): Payer: Medicare Other | Admitting: Family Medicine

## 2012-03-17 ENCOUNTER — Encounter: Payer: Self-pay | Admitting: Family Medicine

## 2012-03-17 VITALS — BP 120/64 | HR 64 | Temp 98.7°F | Ht 64.5 in | Wt 118.2 lb

## 2012-03-17 DIAGNOSIS — I10 Essential (primary) hypertension: Secondary | ICD-10-CM

## 2012-03-17 DIAGNOSIS — E871 Hypo-osmolality and hyponatremia: Secondary | ICD-10-CM

## 2012-03-17 DIAGNOSIS — M810 Age-related osteoporosis without current pathological fracture: Secondary | ICD-10-CM

## 2012-03-17 MED ORDER — TRAMADOL HCL 50 MG PO TABS: 100.0000 mg | ORAL_TABLET | Freq: Two times a day (BID) | ORAL | Status: DC | PRN

## 2012-03-17 MED ORDER — AMLODIPINE BESY-BENAZEPRIL HCL 10-20 MG PO CAPS: 1.0000 | ORAL_CAPSULE | Freq: Every day | ORAL | Status: DC

## 2012-03-17 NOTE — Assessment & Plan Note (Signed)
Resolved on current meds Rev labs with pt - no problems

## 2012-03-17 NOTE — Assessment & Plan Note (Signed)
bp in fair control at this time  No changes needed  Disc lifstyle change with low sodium diet and exercise   

## 2012-03-17 NOTE — Patient Instructions (Signed)
Keep up the great work with healthy diet and exercise Get your mammogram as planned  No change in medicines

## 2012-03-17 NOTE — Assessment & Plan Note (Addendum)
Overall doing well  Has had kyphoplasty and finished 2 years of forteo dexa 4/12 slt imp at hip- will do next one in a year and disc other tx if needed  No recent fractures  Rev labs- good D level  She takes ca and D daily and exercises
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
## 2012-03-24 ENCOUNTER — Ambulatory Visit
Admission: RE | Admit: 2012-03-24 | Discharge: 2012-03-24 | Disposition: A | Discharge status: Home / Self Care | Payer: Medicare Other | Source: Ambulatory Visit | Attending: Family Medicine | Admitting: Family Medicine

## 2012-03-24 DIAGNOSIS — Z1231 Encounter for screening mammogram for malignant neoplasm of breast: Secondary | ICD-10-CM

## 2012-03-26 ENCOUNTER — Encounter: Payer: Self-pay | Admitting: *Deleted

## 2012-05-26 ENCOUNTER — Other Ambulatory Visit: Payer: Self-pay | Admitting: *Deleted

## 2012-05-26 MED ORDER — FLUTICASONE PROPIONATE 50 MCG/ACT NA SUSP: 2.0000 | Freq: Every day | NASAL | Status: DC

## 2012-07-08 ENCOUNTER — Other Ambulatory Visit: Payer: Self-pay | Admitting: Family Medicine

## 2012-07-08 NOTE — Telephone Encounter (Signed)
Called in Rx as directed.  

## 2012-07-08 NOTE — Telephone Encounter (Signed)
Received refill request electronically. Last office visit 03/17/12. Is it okay to refill?

## 2012-07-08 NOTE — Telephone Encounter (Signed)
Px written for call in   

## 2012-10-23 ENCOUNTER — Telehealth: Payer: Self-pay

## 2012-10-23 NOTE — Telephone Encounter (Signed)
Pt is choosing new drug plan and has 3 issues;1) pt is presently taking amlodipine-benazepril 10-20 mg; insurance co advised pt if took med separately would be less expensive; pt wants to know if Dr Milinda Antis will prescribe amlodipine 10 mg and benzepril 20 mg separately. 2) will Dr Milinda Antis refill Alclometaxolsone dip ointment to [facility] and 3) pt takes Calcium and Vit D for osteoporosis; is Dr Milinda Antis contemplating putting pt on Forteo for osteoporosis; this info will help pt pick future drug coverage.Please advise.

## 2012-10-26 NOTE — Telephone Encounter (Signed)
Since she has already been on forteo for 2 years in the past (right?) - will not do again  Follow up to discuss changing her bp med - I need to see how her bp it  Will disc refil the ointment then

## 2012-10-27 NOTE — Telephone Encounter (Signed)
Scheduled f/u appt for 10/29/12

## 2012-10-29 ENCOUNTER — Encounter: Payer: Self-pay | Admitting: Family Medicine

## 2012-10-29 ENCOUNTER — Ambulatory Visit (INDEPENDENT_AMBULATORY_CARE_PROVIDER_SITE_OTHER): Payer: Medicare Other | Admitting: Family Medicine

## 2012-10-29 VITALS — BP 136/74 | HR 82 | Temp 97.7°F | Ht 64.5 in | Wt 121.0 lb

## 2012-10-29 DIAGNOSIS — I10 Essential (primary) hypertension: Secondary | ICD-10-CM

## 2012-10-29 DIAGNOSIS — M810 Age-related osteoporosis without current pathological fracture: Secondary | ICD-10-CM

## 2012-10-29 MED ORDER — BENAZEPRIL HCL 20 MG PO TABS: 20.0000 mg | ORAL_TABLET | Freq: Every day | ORAL | Status: DC

## 2012-10-29 MED ORDER — AMLODIPINE BESYLATE 10 MG PO TABS: 10.0000 mg | ORAL_TABLET | Freq: Every day | ORAL | Status: DC

## 2012-10-29 MED ORDER — ALCLOMETASONE DIPROPIONATE 0.05 % EX OINT: TOPICAL_OINTMENT | Freq: Every day | CUTANEOUS | Status: DC

## 2012-10-29 NOTE — Assessment & Plan Note (Signed)
Disc next opt for tx  Rev dexa 4/12- with low T score in FN and forearm  Pt will look into affordability of meds - she would be a candidate for weekly fosamax/ boniva or evista

## 2012-10-29 NOTE — Assessment & Plan Note (Signed)
bp in fair control at this time  No changes needed  Disc lifstyle change with low sodium diet and exercise   Will split the amlodipine from benazapril and continue to follow bp

## 2012-10-29 NOTE — Patient Instructions (Addendum)
Options for osteoporosis include generic fosamax , boniva(not generic) or evista (not generic) Watch your blood pressure - we will try separate amlodipine and benazepril  - I sent to the pharmacy along with your lip medicine Let me know if your bp goes up
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
## 2012-10-30 ENCOUNTER — Telehealth: Payer: Self-pay | Admitting: *Deleted

## 2012-10-30 MED ORDER — ALENDRONATE SODIUM 70 MG PO TABS: 70.0000 mg | ORAL_TABLET | ORAL | Status: DC

## 2012-10-30 NOTE — Telephone Encounter (Signed)
Ok, lets plan on a 2 year course of fosamax Px written for call in

## 2012-10-30 NOTE — Telephone Encounter (Signed)
Pt notified and Rx called in as prescribed 

## 2012-10-30 NOTE — Telephone Encounter (Signed)
Pt called to follow up from appt. yesterday, pt checked with her insurance and the Fosamax (Alendronate Sodium) is a lot cheaper then the Evista pt wants a Rx for the alendronate unless you think pt should be on the Evista, please advise

## 2012-11-01 ENCOUNTER — Other Ambulatory Visit: Payer: Self-pay | Admitting: Family Medicine

## 2012-11-03 NOTE — Telephone Encounter (Signed)
Ok to refill 

## 2012-11-03 NOTE — Telephone Encounter (Signed)
Px written for call in   

## 2012-11-03 NOTE — Telephone Encounter (Signed)
Rx called in as prescribed 

## 2012-11-11 ENCOUNTER — Other Ambulatory Visit: Payer: Self-pay | Admitting: Family Medicine

## 2012-11-11 NOTE — Telephone Encounter (Signed)
Will refill electronically  

## 2012-11-11 NOTE — Telephone Encounter (Signed)
Ok to refill 

## 2013-01-14 ENCOUNTER — Telehealth: Payer: Self-pay | Admitting: Family Medicine

## 2013-01-14 ENCOUNTER — Encounter (HOSPITAL_COMMUNITY): Payer: Self-pay | Admitting: *Deleted

## 2013-01-14 ENCOUNTER — Emergency Department (HOSPITAL_COMMUNITY)
Admission: EM | Admit: 2013-01-14 | Discharge: 2013-01-14 | Disposition: A | Discharge status: Home / Self Care | Payer: Medicare Other | Attending: Emergency Medicine | Admitting: Emergency Medicine

## 2013-01-14 DIAGNOSIS — Z860100 Personal history of colon polyps, unspecified: Secondary | ICD-10-CM | POA: Insufficient documentation

## 2013-01-14 DIAGNOSIS — S61519A Laceration without foreign body of unspecified wrist, initial encounter: Secondary | ICD-10-CM

## 2013-01-14 DIAGNOSIS — Z87891 Personal history of nicotine dependence: Secondary | ICD-10-CM | POA: Insufficient documentation

## 2013-01-14 DIAGNOSIS — Y939 Activity, unspecified: Secondary | ICD-10-CM | POA: Insufficient documentation

## 2013-01-14 DIAGNOSIS — I10 Essential (primary) hypertension: Secondary | ICD-10-CM | POA: Insufficient documentation

## 2013-01-14 DIAGNOSIS — Z8669 Personal history of other diseases of the nervous system and sense organs: Secondary | ICD-10-CM | POA: Insufficient documentation

## 2013-01-14 DIAGNOSIS — M810 Age-related osteoporosis without current pathological fracture: Secondary | ICD-10-CM | POA: Insufficient documentation

## 2013-01-14 DIAGNOSIS — W5503XA Scratched by cat, initial encounter: Secondary | ICD-10-CM

## 2013-01-14 DIAGNOSIS — Z23 Encounter for immunization: Secondary | ICD-10-CM | POA: Insufficient documentation

## 2013-01-14 DIAGNOSIS — Z79899 Other long term (current) drug therapy: Secondary | ICD-10-CM | POA: Insufficient documentation

## 2013-01-14 DIAGNOSIS — IMO0001 Reserved for inherently not codable concepts without codable children: Secondary | ICD-10-CM | POA: Insufficient documentation

## 2013-01-14 DIAGNOSIS — Z8781 Personal history of (healed) traumatic fracture: Secondary | ICD-10-CM | POA: Insufficient documentation

## 2013-01-14 DIAGNOSIS — Y929 Unspecified place or not applicable: Secondary | ICD-10-CM | POA: Insufficient documentation

## 2013-01-14 DIAGNOSIS — Z8619 Personal history of other infectious and parasitic diseases: Secondary | ICD-10-CM | POA: Insufficient documentation

## 2013-01-14 DIAGNOSIS — K219 Gastro-esophageal reflux disease without esophagitis: Secondary | ICD-10-CM | POA: Insufficient documentation

## 2013-01-14 DIAGNOSIS — IMO0002 Reserved for concepts with insufficient information to code with codable children: Secondary | ICD-10-CM | POA: Insufficient documentation

## 2013-01-14 MED ORDER — MUPIROCIN 2 % EX OINT: TOPICAL_OINTMENT | Freq: Three times a day (TID) | CUTANEOUS | Status: DC

## 2013-01-14 MED ORDER — DOXYCYCLINE HYCLATE 100 MG PO CAPS: 100.0000 mg | ORAL_CAPSULE | Freq: Two times a day (BID) | ORAL | Status: DC

## 2013-01-14 MED ORDER — TETANUS-DIPHTH-ACELL PERTUSSIS 5-2.5-18.5 LF-MCG/0.5 IM SUSP
0.5000 mL | Freq: Once | INTRAMUSCULAR | Status: AC
  Administered 2013-01-14: 0.5 mL via INTRAMUSCULAR
  Filled 2013-01-14: qty 0.5

## 2013-01-14 NOTE — Telephone Encounter (Signed)
Patient Information:  Caller Name: Rivers  Phone: [phone]  Patient: Wendy [name], Wendy [name]  Gender: Female  DOB: 1946/12/16  Age: 67 Years  PCP: Roxy Manns [facility])  Office Follow Up:  Does the office need to follow up with this patient?: Yes  Instructions For The Office: Please call about where & when to be seen ASAP.  Declined ED.   RN Note:  Injured trying to rescue ferrel cat and place in carrier. One lacerationon forearm is deep (can see fat) and may need sutures and  the other is puncture wound that contines to ooze.  Declined ED visit; requests to be seen in office.  No appointments available until 1515 with Dr Patsy Lager. Please call ASAP to advise.   Symptoms  Reason For Call & Symptoms: Called re forearms scratched by ferrel cat;  notes deep wound L forearm where can see "fat" and R forearm has puncture wound that bled heavily.  Reviewed Health History In EMR: Yes  Reviewed Medications In EMR: Yes  Reviewed Allergies In EMR: Yes  Reviewed Surgeries / Procedures: Yes  Date of Onset of Symptoms: 01/14/2013  Treatments Tried: washed with soap , antibiotic ointment and bandaid.  Treatments Tried Worked: Yes  Guideline(s) Used:  Skin Injury  Animal Bite  Disposition Per Guideline:   Go to ED Now  Reason For Disposition Reached:   Any break in skin (e.g., cut, puncture, or scratch) and dog, cat, or ferret at risk for RABIES (e.g., sick, stray, unprovoked bite, developing country)  Advice Given:  N/A  Patient Refused Recommendation:  Patient Will Follow Up With Office Later  Requested office appointment;  please call to advise if MD approves and can be worked in.

## 2013-01-14 NOTE — ED Notes (Signed)
Pt reports trying to rescue a stray cat this am, has skin tears/abrasions to bilateral wrist. Bleeding controlled and no acute distress noted at triage.

## 2013-01-14 NOTE — Telephone Encounter (Signed)
Thanks- it looks from the computer like she is in the ED now - I will follow her progress

## 2013-01-14 NOTE — Telephone Encounter (Signed)
I called pt back and strongly advised she go to ER due to the nature of her injury and severity of the laceration as they can administer the Rabies vacc if appropriate.  She states lac is no longer bleeding, but it is still oozing. She states she will go to [employer].
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
## 2013-01-15 NOTE — ED Provider Notes (Signed)
Medical screening examination/treatment/procedure(s) were performed by non-physician practitioner and as supervising physician I was immediately available for consultation/collaboration.   Tanishka Drolet M Katniss Weedman, DO 01/15/13 1604 

## 2013-04-20 ENCOUNTER — Other Ambulatory Visit: Payer: Self-pay | Admitting: Family Medicine

## 2013-04-20 NOTE — Telephone Encounter (Signed)
Ok to refill 

## 2013-04-20 NOTE — Telephone Encounter (Signed)
Rx called in as prescribed 

## 2013-04-20 NOTE — Telephone Encounter (Signed)
Px written for call in   

## 2013-05-06 ENCOUNTER — Other Ambulatory Visit: Payer: Self-pay

## 2013-05-06 DIAGNOSIS — Z1231 Encounter for screening mammogram for malignant neoplasm of breast: Secondary | ICD-10-CM

## 2013-05-12 ENCOUNTER — Other Ambulatory Visit: Payer: Medicare Other

## 2013-05-17 ENCOUNTER — Telehealth: Payer: Self-pay | Admitting: Family Medicine

## 2013-05-17 DIAGNOSIS — I10 Essential (primary) hypertension: Secondary | ICD-10-CM

## 2013-05-17 DIAGNOSIS — E871 Hypo-osmolality and hyponatremia: Secondary | ICD-10-CM

## 2013-05-17 DIAGNOSIS — Z0000 Encounter for general adult medical examination without abnormal findings: Secondary | ICD-10-CM

## 2013-05-17 DIAGNOSIS — M810 Age-related osteoporosis without current pathological fracture: Secondary | ICD-10-CM

## 2013-05-17 NOTE — Telephone Encounter (Signed)
Message copied by Judy Pimple on Sun May 17, 2013 12:10 PM ------      Message from: Alvina Chou      Created: Fri May 01, 2013 12:01 PM      Regarding: lab orders for Wednesday, 5.28.14       Patient is scheduled for CPX labs, please order future labs, Thanks , Terri       ------

## 2013-05-19 ENCOUNTER — Encounter: Payer: Self-pay | Admitting: Radiology

## 2013-05-19 ENCOUNTER — Encounter: Payer: Medicare Other | Admitting: Family Medicine

## 2013-05-20 ENCOUNTER — Other Ambulatory Visit: Payer: Medicare Other

## 2013-05-21 ENCOUNTER — Encounter: Payer: Self-pay | Admitting: Radiology

## 2013-05-22 ENCOUNTER — Other Ambulatory Visit (INDEPENDENT_AMBULATORY_CARE_PROVIDER_SITE_OTHER): Payer: Medicare Other

## 2013-05-22 DIAGNOSIS — I10 Essential (primary) hypertension: Secondary | ICD-10-CM

## 2013-05-22 DIAGNOSIS — E871 Hypo-osmolality and hyponatremia: Secondary | ICD-10-CM

## 2013-05-22 DIAGNOSIS — M810 Age-related osteoporosis without current pathological fracture: Secondary | ICD-10-CM

## 2013-05-22 DIAGNOSIS — Z0000 Encounter for general adult medical examination without abnormal findings: Secondary | ICD-10-CM

## 2013-05-22 LAB — CBC WITH DIFFERENTIAL/PLATELET
Basophils Absolute: 0 10*3/uL (ref 0.0–0.1)
Basophils Relative: 0.3 % (ref 0.0–3.0)
Eosinophils Absolute: 0 10*3/uL (ref 0.0–0.7)
Eosinophils Relative: 0 % (ref 0.0–5.0)
HCT: 39.4 % (ref 36.0–46.0)
Hemoglobin: 13.5 g/dL (ref 12.0–15.0)
Lymphocytes Relative: 17.8 % (ref 12.0–46.0)
Lymphs Abs: 1 10*3/uL (ref 0.7–4.0)
MCHC: 34.3 g/dL (ref 30.0–36.0)
MCV: 89.4 fl (ref 78.0–100.0)
Monocytes Absolute: 0.8 10*3/uL (ref 0.1–1.0)
Monocytes Relative: 13.9 % — ABNORMAL HIGH (ref 3.0–12.0)
Neutro Abs: 3.8 10*3/uL (ref 1.4–7.7)
Neutrophils Relative %: 68 % (ref 43.0–77.0)
Platelets: 221 10*3/uL (ref 150.0–400.0)
RBC: 4.4 Mil/uL (ref 3.87–5.11)
RDW: 13.7 % (ref 11.5–14.6)
WBC: 5.6 10*3/uL (ref 4.5–10.5)

## 2013-05-22 LAB — COMPREHENSIVE METABOLIC PANEL
ALT: 17 U/L (ref 0–35)
AST: 40 U/L — ABNORMAL HIGH (ref 0–37)
Albumin: 3.9 g/dL (ref 3.5–5.2)
Alkaline Phosphatase: 46 U/L (ref 39–117)
BUN: 11 mg/dL (ref 6–23)
CO2: 27 mEq/L (ref 19–32)
Calcium: 8.6 mg/dL (ref 8.4–10.5)
Chloride: 89 mEq/L — ABNORMAL LOW (ref 96–112)
Creatinine, Ser: 1 mg/dL (ref 0.4–1.2)
GFR: 55.88 mL/min — ABNORMAL LOW (ref >60.00)
Glucose, Bld: 109 mg/dL — ABNORMAL HIGH (ref 70–99)
Potassium: 3.7 mEq/L (ref 3.5–5.1)
Sodium: 126 mEq/L — ABNORMAL LOW (ref 135–145)
Total Bilirubin: 0.3 mg/dL (ref 0.3–1.2)
Total Protein: 6.6 g/dL (ref 6.0–8.3)

## 2013-05-22 LAB — LIPID PANEL
Cholesterol: 179 mg/dL (ref 0–200)
HDL: 69.7 mg/dL (ref >39.00)
LDL Cholesterol: 90 mg/dL (ref 0–99)
Total CHOL/HDL Ratio: 3
Triglycerides: 96 mg/dL (ref 0.0–149.0)
VLDL: 19.2 mg/dL (ref 0.0–40.0)

## 2013-05-22 LAB — TSH: TSH: 1.13 u[IU]/mL (ref 0.35–5.50)

## 2013-05-23 LAB — VITAMIN D 25 HYDROXY (VIT D DEFICIENCY, FRACTURES): Vit D, 25-Hydroxy: 73 ng/mL (ref 30–89)

## 2013-05-27 ENCOUNTER — Ambulatory Visit (INDEPENDENT_AMBULATORY_CARE_PROVIDER_SITE_OTHER): Payer: Medicare Other | Admitting: Family Medicine

## 2013-05-27 ENCOUNTER — Encounter: Payer: Self-pay | Admitting: Family Medicine

## 2013-05-27 VITALS — BP 118/68 | HR 98 | Temp 98.3°F | Ht 64.0 in | Wt 112.8 lb

## 2013-05-27 DIAGNOSIS — Z23 Encounter for immunization: Secondary | ICD-10-CM

## 2013-05-27 DIAGNOSIS — M810 Age-related osteoporosis without current pathological fracture: Secondary | ICD-10-CM

## 2013-05-27 DIAGNOSIS — I10 Essential (primary) hypertension: Secondary | ICD-10-CM

## 2013-05-27 DIAGNOSIS — Z0000 Encounter for general adult medical examination without abnormal findings: Secondary | ICD-10-CM

## 2013-05-27 DIAGNOSIS — E871 Hypo-osmolality and hyponatremia: Secondary | ICD-10-CM

## 2013-05-27 MED ORDER — ALCLOMETASONE DIPROPIONATE 0.05 % EX OINT: TOPICAL_OINTMENT | Freq: Every day | CUTANEOUS | Status: DC

## 2013-05-27 MED ORDER — IBANDRONATE SODIUM 150 MG PO TABS: 150.0000 mg | ORAL_TABLET | ORAL | Status: DC

## 2013-05-27 NOTE — Progress Notes (Signed)
Subjective:    Patient ID: Wendy [name], female    DOB: [birth year], 67 y.o.   MRN: [id number]  HPI I have personally reviewed the Medicare Annual Wellness questionnaire and have noted 1. The patient's medical and social history 2. Their use of alcohol, tobacco or illicit drugs 3. Their current medications and supplements 4. The patient's functional ability including ADL's, fall risks, home safety risks and hearing or visual             impairment. 5. Diet and physical activities 6. Evidence for depression or mood disorders  The patients weight, height, BMI have been recorded in the chart and visual acuity is per eye clinic.  I have made referrals, counseling and provided education to the patient based review of the above and I have provided the pt with a written personalized care plan for preventive services.  Had questions about the toxicology screen - those were answered  Will do the urine drug screen today as part of our controlled subst contract   She had uri last week - cough and headache and low grade headache   See scanned forms.  Routine anticipatory guidance given to patient.  See health maintenance. Flu- does not get the flu vaccine - will likely get vaccine from now on  Shingles 1/08 PNA declined 3/13 -wants to do that today Tetanus 1/14  Colon 1/11 - 3-5 year recall  Breast cancer screening-4/13 mamm-- has one scheduled later this month  No gyn symptoms -hyst   Advance directive-does have that set up - is planning that  Cognitive function addressed- see scanned forms- and if abnormal then additional documentation follows.  No concerns   PMH and SH reviewed Brother had a stroke lately at age 96 with HTN He has a history of low na also   Meds, vitals, and allergies reviewed.   ROS: See HPI.  Otherwise negative.    Falls-none   Mood- very good   OP Dexa 4/12 She has had kyphoplasty Good D level in 70s On fosamax- 1 year- wants to change to boniva   Pap  1990  Hyponatremia Has been drinking a lot of water    Chemistry      Component Value Date/Time   NA 126* 05/22/2013 0823   K 3.7 05/22/2013 0823   CL 89* 05/22/2013 0823   CO2 27 05/22/2013 0823   BUN 11 05/22/2013 0823   CREATININE 1.0 05/22/2013 0823      Component Value Date/Time   CALCIUM 8.6 05/22/2013 0823   ALKPHOS 46 05/22/2013 0823   AST 40* 05/22/2013 0823   ALT 17 05/22/2013 0823   BILITOT 0.3 05/22/2013 0823       Review of Systems Review of Systems  Constitutional: Negative for fever, appetite change, fatigue and unexpected weight change.  Eyes: Negative for pain and visual disturbance.  Respiratory: Negative for cough and shortness of breath.   Cardiovascular: Negative for cp or palpitations    Gastrointestinal: Negative for nausea, diarrhea and constipation.  Genitourinary: Negative for urgency and frequency.  Skin: Negative for pallor or rash   Neurological: Negative for weakness, light-headedness, numbness and headaches.  Hematological: Negative for adenopathy. Does not bruise/bleed easily.  Psychiatric/Behavioral: Negative for dysphoric mood. The patient is not nervous/anxious.         Objective:   Physical Exam  Constitutional: She appears well-developed and well-nourished. No distress.  HENT:  Head: Normocephalic and atraumatic.  Mouth/Throat: Oropharynx is clear and moist.  Eyes: Conjunctivae and  EOM are normal. Pupils are equal, round, and reactive to light. No scleral icterus.  Neck: Normal range of motion. Neck supple. No JVD present. Carotid bruit is not present. No thyromegaly present.  Cardiovascular: Normal rate, regular rhythm, normal heart sounds and intact distal pulses.  Exam reveals no gallop.   Pulmonary/Chest: Effort normal and breath sounds normal. No respiratory distress. She has no wheezes. She has no rales.  Abdominal: Soft. Bowel sounds are normal. She exhibits no distension, no abdominal bruit and no mass. There is no tenderness.   Genitourinary: No breast swelling, tenderness, discharge or bleeding.  Breast exam: No mass, nodules, thickening, tenderness, bulging, retraction, inflamation, nipple discharge or skin changes noted.  No axillary or clavicular LA.  Chaperoned exam.    Musculoskeletal: Normal range of motion. She exhibits no edema and no tenderness.  Lymphadenopathy:    She has no cervical adenopathy.  Neurological: She is alert. She has normal reflexes. No cranial nerve deficit. She exhibits normal muscle tone. Coordination normal.  Skin: Skin is warm and dry. No rash noted. No erythema. No pallor.  Psychiatric: She has a normal mood and affect.          Assessment & Plan:

## 2013-05-27 NOTE — Patient Instructions (Addendum)
Pneumonia vaccine today  We will refer you for dexa at check out  Change fosamax to boniva  Reduce water intake Schedule non fasting lab in 2 weeks for sodium level

## 2013-05-28 NOTE — Assessment & Plan Note (Signed)
bp in fair control at this time  No changes needed  Disc lifstyle change with low sodium diet and exercise  Lab reviewed

## 2013-05-28 NOTE — Assessment & Plan Note (Signed)
This is ongoing and intermittent- no cause found except heavy water drinking (lifetime habit) Incidentally she reports her brother has the same problem Sodium 126 Disc risks of low sodium incl seizure Not on diuretics Will cut back water intake/ fluids - and re check this soon - if still low will require further work up

## 2013-05-28 NOTE — Assessment & Plan Note (Signed)
On fosamax 1 y- and will change to boniva due to preference Due for dexa-that was planned Disc imp of ca and D and exercise No falls or fx

## 2013-05-28 NOTE — Assessment & Plan Note (Signed)
Reviewed health habits including diet and exercise and skin cancer prevention Also reviewed health mt list, fam hx and immunizations   See HPI Labs reviewed  Enc to keep taking good care of herself

## 2013-06-08 ENCOUNTER — Ambulatory Visit (INDEPENDENT_AMBULATORY_CARE_PROVIDER_SITE_OTHER): Payer: Medicare Other | Admitting: Family Medicine

## 2013-06-08 ENCOUNTER — Encounter (INDEPENDENT_AMBULATORY_CARE_PROVIDER_SITE_OTHER): Payer: Medicare Other | Admitting: Family Medicine

## 2013-06-08 ENCOUNTER — Encounter: Payer: Self-pay | Admitting: Family Medicine

## 2013-06-08 VITALS — BP 122/62 | HR 76 | Temp 98.4°F | Wt 112.2 lb

## 2013-06-08 DIAGNOSIS — I10 Essential (primary) hypertension: Secondary | ICD-10-CM

## 2013-06-08 DIAGNOSIS — J019 Acute sinusitis, unspecified: Secondary | ICD-10-CM

## 2013-06-08 DIAGNOSIS — E871 Hypo-osmolality and hyponatremia: Secondary | ICD-10-CM

## 2013-06-08 LAB — BASIC METABOLIC PANEL
BUN: 11 mg/dL (ref 6–23)
CO2: 30 mEq/L (ref 19–32)
Calcium: 9.3 mg/dL (ref 8.4–10.5)
Chloride: 96 mEq/L (ref 96–112)
Creatinine, Ser: 1 mg/dL (ref 0.4–1.2)
GFR: 60.55 mL/min (ref >60.00)
Glucose, Bld: 121 mg/dL — ABNORMAL HIGH (ref 70–99)
Potassium: 4.9 mEq/L (ref 3.5–5.1)
Sodium: 131 mEq/L — ABNORMAL LOW (ref 135–145)

## 2013-06-08 MED ORDER — AZITHROMYCIN 250 MG PO TABS: ORAL_TABLET | ORAL | Status: DC

## 2013-06-08 NOTE — Progress Notes (Signed)
3 weeks ago had URI sx, cough and head pressure.  It got better but didn't resolve by the time she saw Dr. Milinda Antis recently. Now with R facial pain.  Some cough.  No fevers.  Scant sputum.  Postnasal gtt, discolored.  No L sided sx.  No ST.  R ear pain noted. Dec in appetite, recently some better.  R upper tooth pain.   Meds, vitals, and allergies reviewed.   ROS: See HPI.  Otherwise, noncontributory.  GEN: nad, alert and oriented HEENT: mucous membranes moist, tm w/o erythema, nasal exam w/o erythema, purulent discharge noted in R nostril,  OP with cobblestoning, R max sinus ttp NECK: supple w/o LA CV: rrr.   PULM: ctab, no inc wob EXT: no edema SKIN: no acute rash

## 2013-06-08 NOTE — Patient Instructions (Addendum)
Go to the lab on the way out.  We'll contact you with your lab report.  Start the antibiotics today.  Take tylenol as needed, and this should gradually improve.  Take care.  Let us know if you have other concerns.

## 2013-06-09 ENCOUNTER — Ambulatory Visit
Admission: RE | Admit: 2013-06-09 | Discharge: 2013-06-09 | Disposition: A | Discharge status: Home / Self Care | Payer: Medicare Other | Source: Ambulatory Visit

## 2013-06-09 ENCOUNTER — Encounter: Payer: Self-pay | Admitting: Family Medicine

## 2013-06-09 ENCOUNTER — Ambulatory Visit
Admission: RE | Admit: 2013-06-09 | Discharge: 2013-06-09 | Disposition: A | Discharge status: Home / Self Care | Payer: Medicare Other | Source: Ambulatory Visit | Attending: Family Medicine | Admitting: Family Medicine

## 2013-06-09 DIAGNOSIS — M810 Age-related osteoporosis without current pathological fracture: Secondary | ICD-10-CM

## 2013-06-09 DIAGNOSIS — Z1231 Encounter for screening mammogram for malignant neoplasm of breast: Secondary | ICD-10-CM

## 2013-06-09 LAB — HM DEXA SCAN

## 2013-06-09 LAB — HM MAMMOGRAPHY: HM Mammogram: NORMAL

## 2013-06-10 ENCOUNTER — Other Ambulatory Visit: Payer: Medicare Other

## 2013-06-10 DIAGNOSIS — J019 Acute sinusitis, unspecified: Secondary | ICD-10-CM | POA: Insufficient documentation

## 2013-06-10 NOTE — Assessment & Plan Note (Signed)
Nontoxic, given the allergy hx, would use zmax and supportive measures o/w.  F/u prn.d/w pt.

## 2013-06-11 ENCOUNTER — Encounter: Payer: Self-pay | Admitting: Family Medicine

## 2013-06-11 ENCOUNTER — Encounter: Payer: Self-pay | Admitting: *Deleted

## 2013-06-15 ENCOUNTER — Encounter: Payer: Self-pay | Admitting: *Deleted

## 2013-06-15 ENCOUNTER — Encounter: Payer: Self-pay | Admitting: Family Medicine

## 2013-06-23 ENCOUNTER — Other Ambulatory Visit (INDEPENDENT_AMBULATORY_CARE_PROVIDER_SITE_OTHER): Payer: Medicare Other

## 2013-06-23 DIAGNOSIS — E871 Hypo-osmolality and hyponatremia: Secondary | ICD-10-CM

## 2013-06-23 LAB — BASIC METABOLIC PANEL
BUN: 14 mg/dL (ref 6–23)
CO2: 28 mEq/L (ref 19–32)
Calcium: 9.1 mg/dL (ref 8.4–10.5)
Chloride: 100 mEq/L (ref 96–112)
Creatinine, Ser: 0.9 mg/dL (ref 0.4–1.2)
GFR: 70.51 mL/min (ref >60.00)
Glucose, Bld: 91 mg/dL (ref 70–99)
Potassium: 4.6 mEq/L (ref 3.5–5.1)
Sodium: 133 mEq/L — ABNORMAL LOW (ref 135–145)

## 2013-06-24 ENCOUNTER — Other Ambulatory Visit: Payer: Self-pay | Admitting: Family Medicine

## 2013-06-24 ENCOUNTER — Encounter: Payer: Self-pay | Admitting: *Deleted

## 2013-06-24 NOTE — Telephone Encounter (Signed)
Rx called in as prescribed 

## 2013-06-24 NOTE — Telephone Encounter (Signed)
Px written for call in   

## 2013-06-30 NOTE — Telephone Encounter (Signed)
This encounter was created in error - please disregard.

## 2013-07-13 ENCOUNTER — Telehealth: Payer: Self-pay

## 2013-07-13 NOTE — Telephone Encounter (Signed)
Pt left v/m questioning charges for [facility]; pt said she spoke with Richardson Dopp and did not get cb. Pt thinks some charges are duplicates. Cat with [facility] said Richardson Dopp will call pt. Pt wants to know what to do if Richardson Dopp does not call back and advised pt to call me back if no response. Pt voiced understanding.

## 2013-09-07 ENCOUNTER — Other Ambulatory Visit: Payer: Self-pay | Admitting: Family Medicine

## 2013-09-25 ENCOUNTER — Other Ambulatory Visit: Payer: Self-pay | Admitting: Family Medicine

## 2013-09-25 NOTE — Telephone Encounter (Signed)
Electronic refill request, please advise  

## 2013-09-25 NOTE — Telephone Encounter (Signed)
Px written for call in   

## 2013-09-25 NOTE — Telephone Encounter (Signed)
Rx called in as prescribed 

## 2013-10-26 ENCOUNTER — Other Ambulatory Visit: Payer: Self-pay | Admitting: Family Medicine

## 2013-10-26 NOTE — Telephone Encounter (Signed)
Rx called in as prescribed 

## 2013-10-26 NOTE — Telephone Encounter (Signed)
Electronic refill request, please advise  

## 2013-10-26 NOTE — Telephone Encounter (Signed)
Px written for call in   

## 2013-10-28 ENCOUNTER — Encounter (INDEPENDENT_AMBULATORY_CARE_PROVIDER_SITE_OTHER): Payer: Medicare Other | Admitting: Ophthalmology

## 2013-10-28 DIAGNOSIS — H43819 Vitreous degeneration, unspecified eye: Secondary | ICD-10-CM

## 2013-10-28 DIAGNOSIS — H2510 Age-related nuclear cataract, unspecified eye: Secondary | ICD-10-CM

## 2013-10-28 DIAGNOSIS — H33309 Unspecified retinal break, unspecified eye: Secondary | ICD-10-CM

## 2013-10-28 DIAGNOSIS — H35039 Hypertensive retinopathy, unspecified eye: Secondary | ICD-10-CM

## 2013-10-28 DIAGNOSIS — I10 Essential (primary) hypertension: Secondary | ICD-10-CM

## 2013-11-09 ENCOUNTER — Ambulatory Visit (INDEPENDENT_AMBULATORY_CARE_PROVIDER_SITE_OTHER): Payer: Medicare Other | Admitting: Ophthalmology

## 2013-11-09 DIAGNOSIS — H33309 Unspecified retinal break, unspecified eye: Secondary | ICD-10-CM

## 2013-12-01 ENCOUNTER — Other Ambulatory Visit: Payer: Self-pay | Admitting: Family Medicine

## 2013-12-01 NOTE — Telephone Encounter (Signed)
Rx declined, refilled on 10/26/13 #120 with 3 add refills

## 2013-12-01 NOTE — Telephone Encounter (Signed)
Electronic refill request, please advise  

## 2013-12-01 NOTE — Telephone Encounter (Signed)
I think she got a month supply with 3 refills 5 weeks ago?

## 2013-12-18 ENCOUNTER — Other Ambulatory Visit: Payer: Self-pay | Admitting: Family Medicine

## 2013-12-18 NOTE — Telephone Encounter (Signed)
Electronic refill request, please advise  

## 2013-12-18 NOTE — Telephone Encounter (Signed)
Rx called in as prescribed 

## 2013-12-18 NOTE — Telephone Encounter (Signed)
Px written for call in   

## 2014-02-22 ENCOUNTER — Other Ambulatory Visit: Payer: Self-pay | Admitting: Family Medicine

## 2014-03-10 ENCOUNTER — Ambulatory Visit (INDEPENDENT_AMBULATORY_CARE_PROVIDER_SITE_OTHER): Payer: Medicare Other | Admitting: Ophthalmology

## 2014-03-10 DIAGNOSIS — H35039 Hypertensive retinopathy, unspecified eye: Secondary | ICD-10-CM

## 2014-03-10 DIAGNOSIS — H33309 Unspecified retinal break, unspecified eye: Secondary | ICD-10-CM

## 2014-03-10 DIAGNOSIS — H43819 Vitreous degeneration, unspecified eye: Secondary | ICD-10-CM

## 2014-03-10 DIAGNOSIS — H2510 Age-related nuclear cataract, unspecified eye: Secondary | ICD-10-CM

## 2014-03-10 DIAGNOSIS — I10 Essential (primary) hypertension: Secondary | ICD-10-CM

## 2014-03-30 ENCOUNTER — Other Ambulatory Visit: Payer: Self-pay | Admitting: Family Medicine

## 2014-03-30 NOTE — Telephone Encounter (Signed)
plz phone in tramadol. 

## 2014-03-30 NOTE — Telephone Encounter (Signed)
Electronic refill request, Dr. Tower out of office, please advise  

## 2014-03-30 NOTE — Telephone Encounter (Signed)
Rx called in as prescribed 

## 2014-05-03 ENCOUNTER — Other Ambulatory Visit: Payer: Self-pay | Admitting: Family Medicine

## 2014-05-03 NOTE — Telephone Encounter (Signed)
Rx called in as prescribed 

## 2014-05-03 NOTE — Telephone Encounter (Signed)
Ok to refill 

## 2014-05-03 NOTE — Telephone Encounter (Signed)
Px written for call in   

## 2014-05-24 ENCOUNTER — Other Ambulatory Visit: Payer: Medicare Other

## 2014-05-31 ENCOUNTER — Encounter: Payer: Medicare Other | Admitting: Family Medicine

## 2014-06-10 ENCOUNTER — Other Ambulatory Visit: Payer: Self-pay | Admitting: Family Medicine

## 2014-06-10 ENCOUNTER — Other Ambulatory Visit: Payer: Self-pay

## 2014-06-10 DIAGNOSIS — Z1231 Encounter for screening mammogram for malignant neoplasm of breast: Secondary | ICD-10-CM

## 2014-06-10 NOTE — Telephone Encounter (Signed)
Ok to refill in Dr. Marliss Coots absence? Last filled 05/03/14 # 120 with 0RF

## 2014-06-11 ENCOUNTER — Ambulatory Visit
Admission: RE | Admit: 2014-06-11 | Discharge: 2014-06-11 | Disposition: A | Discharge status: Home / Self Care | Payer: Medicare Other | Source: Ambulatory Visit

## 2014-06-11 DIAGNOSIS — Z1231 Encounter for screening mammogram for malignant neoplasm of breast: Secondary | ICD-10-CM

## 2014-06-11 NOTE — Telephone Encounter (Signed)
Rx called in as directed.   

## 2014-06-11 NOTE — Telephone Encounter (Signed)
Please call in.  Thanks.   

## 2014-06-14 ENCOUNTER — Encounter: Payer: Self-pay | Admitting: *Deleted

## 2014-06-22 ENCOUNTER — Other Ambulatory Visit: Payer: Self-pay | Admitting: Family Medicine

## 2014-06-22 NOTE — Telephone Encounter (Signed)
Rx called in to pharmacy. 

## 2014-06-22 NOTE — Telephone Encounter (Signed)
Px written for call in   

## 2014-06-22 NOTE — Telephone Encounter (Signed)
Received refill request electronically from pharmacy. Last refill 12/18/13 #30/4 refills. Patient has an appointment scheduled for 07/20/14. Is it okay to refill medication?

## 2014-07-12 ENCOUNTER — Telehealth: Payer: Self-pay | Admitting: Family Medicine

## 2014-07-12 DIAGNOSIS — I10 Essential (primary) hypertension: Secondary | ICD-10-CM

## 2014-07-12 DIAGNOSIS — M810 Age-related osteoporosis without current pathological fracture: Secondary | ICD-10-CM

## 2014-07-12 NOTE — Telephone Encounter (Signed)
Message copied by Abner Greenspan on Mon Jul 12, 2014  8:55 PM ------      Message from: Ellamae Sia      Created: Thu Jul 08, 2014  2:57 PM      Regarding: Lab orders for Tuesday, 7.21.15       Patient is scheduled for CPX labs, please order future labs, Thanks , Terri       ------

## 2014-07-13 ENCOUNTER — Other Ambulatory Visit: Payer: Medicare Other

## 2014-07-14 ENCOUNTER — Other Ambulatory Visit (INDEPENDENT_AMBULATORY_CARE_PROVIDER_SITE_OTHER): Payer: Medicare Other

## 2014-07-14 DIAGNOSIS — E871 Hypo-osmolality and hyponatremia: Secondary | ICD-10-CM

## 2014-07-14 DIAGNOSIS — M810 Age-related osteoporosis without current pathological fracture: Secondary | ICD-10-CM

## 2014-07-14 DIAGNOSIS — I10 Essential (primary) hypertension: Secondary | ICD-10-CM

## 2014-07-14 DIAGNOSIS — Z0000 Encounter for general adult medical examination without abnormal findings: Secondary | ICD-10-CM

## 2014-07-14 LAB — CBC WITH DIFFERENTIAL/PLATELET
Basophils Absolute: 0 10*3/uL (ref 0.0–0.1)
Basophils Relative: 0.5 % (ref 0.0–3.0)
Eosinophils Absolute: 0.2 10*3/uL (ref 0.0–0.7)
Eosinophils Relative: 3.1 % (ref 0.0–5.0)
HCT: 39.3 % (ref 36.0–46.0)
Hemoglobin: 13.3 g/dL (ref 12.0–15.0)
Lymphocytes Relative: 32.1 % (ref 12.0–46.0)
Lymphs Abs: 1.7 10*3/uL (ref 0.7–4.0)
MCHC: 33.7 g/dL (ref 30.0–36.0)
MCV: 92.9 fl (ref 78.0–100.0)
Monocytes Absolute: 0.6 10*3/uL (ref 0.1–1.0)
Monocytes Relative: 12.1 % — ABNORMAL HIGH (ref 3.0–12.0)
Neutro Abs: 2.8 10*3/uL (ref 1.4–7.7)
Neutrophils Relative %: 52.2 % (ref 43.0–77.0)
Platelets: 322 10*3/uL (ref 150.0–400.0)
RBC: 4.23 Mil/uL (ref 3.87–5.11)
RDW: 13.9 % (ref 11.5–15.5)
WBC: 5.3 10*3/uL (ref 4.0–10.5)

## 2014-07-14 LAB — COMPREHENSIVE METABOLIC PANEL
ALT: 12 U/L (ref 0–35)
AST: 22 U/L (ref 0–37)
Albumin: 4.1 g/dL (ref 3.5–5.2)
Alkaline Phosphatase: 44 U/L (ref 39–117)
BUN: 9 mg/dL (ref 6–23)
CO2: 29 mEq/L (ref 19–32)
Calcium: 9.4 mg/dL (ref 8.4–10.5)
Chloride: 100 mEq/L (ref 96–112)
Creatinine, Ser: 0.9 mg/dL (ref 0.4–1.2)
GFR: 70.3 mL/min (ref >60.00)
Glucose, Bld: 95 mg/dL (ref 70–99)
Potassium: 4.6 mEq/L (ref 3.5–5.1)
Sodium: 135 mEq/L (ref 135–145)
Total Bilirubin: 1 mg/dL (ref 0.2–1.2)
Total Protein: 6.7 g/dL (ref 6.0–8.3)

## 2014-07-14 LAB — LIPID PANEL
Cholesterol: 199 mg/dL (ref 0–200)
HDL: 101.4 mg/dL (ref >39.00)
LDL Cholesterol: 86 mg/dL (ref 0–99)
NonHDL: 97.6
Total CHOL/HDL Ratio: 2
Triglycerides: 56 mg/dL (ref 0.0–149.0)
VLDL: 11.2 mg/dL (ref 0.0–40.0)

## 2014-07-14 LAB — VITAMIN D 25 HYDROXY (VIT D DEFICIENCY, FRACTURES): VITD: 54.53 ng/mL (ref 30.00–100.00)

## 2014-07-14 LAB — TSH: TSH: 1.22 u[IU]/mL (ref 0.35–4.50)

## 2014-07-20 ENCOUNTER — Ambulatory Visit (INDEPENDENT_AMBULATORY_CARE_PROVIDER_SITE_OTHER): Payer: Medicare Other | Admitting: Family Medicine

## 2014-07-20 ENCOUNTER — Other Ambulatory Visit: Payer: Self-pay | Admitting: Family Medicine

## 2014-07-20 ENCOUNTER — Encounter: Payer: Self-pay | Admitting: Family Medicine

## 2014-07-20 VITALS — BP 136/82 | HR 81 | Temp 97.7°F | Ht 64.0 in | Wt 114.5 lb

## 2014-07-20 DIAGNOSIS — Z0000 Encounter for general adult medical examination without abnormal findings: Secondary | ICD-10-CM

## 2014-07-20 DIAGNOSIS — M810 Age-related osteoporosis without current pathological fracture: Secondary | ICD-10-CM

## 2014-07-20 DIAGNOSIS — Z23 Encounter for immunization: Secondary | ICD-10-CM

## 2014-07-20 DIAGNOSIS — I10 Essential (primary) hypertension: Secondary | ICD-10-CM

## 2014-07-20 MED ORDER — FLUTICASONE PROPIONATE 50 MCG/ACT NA SUSP: NASAL | Status: DC

## 2014-07-20 MED ORDER — AMLODIPINE BESYLATE 10 MG PO TABS: ORAL_TABLET | ORAL | Status: DC

## 2014-07-20 MED ORDER — BENAZEPRIL HCL 20 MG PO TABS: ORAL_TABLET | ORAL | Status: DC

## 2014-07-20 MED ORDER — IBANDRONATE SODIUM 150 MG PO TABS: 150.0000 mg | ORAL_TABLET | ORAL | Status: DC

## 2014-07-20 NOTE — Assessment & Plan Note (Signed)
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

## 2014-07-20 NOTE — Patient Instructions (Signed)
prevnar vaccine today  Call us when you are ready to schedule your colonoscopy  Take care of yourself

## 2014-07-20 NOTE — Telephone Encounter (Signed)
Electronic refill request, pt was seen today for her CPE, please advise

## 2014-07-20 NOTE — Assessment & Plan Note (Signed)
Reviewed health habits including diet and exercise and skin cancer prevention Reviewed appropriate screening tests for age  Also reviewed health mt list, fam hx and immunization status , as well as social and family history   See HPI prevnar vaccine today  Labs reviewed  Pt will call back to schedule her colonoscopy which is due  She declines an IFOB card
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
## 2014-07-20 NOTE — Telephone Encounter (Signed)
Please refill times 5 , thanks

## 2014-07-20 NOTE — Telephone Encounter (Signed)
done

## 2014-07-20 NOTE — Assessment & Plan Note (Signed)
bp in fair control at this time  BP Readings from Last 1 Encounters:  07/20/14 136/82   No changes needed Disc lifstyle change with low sodium diet and exercise  Labs reviewed

## 2014-07-20 NOTE — Progress Notes (Signed)
Pre visit review using our clinic review tool, if applicable. No additional management support is needed unless otherwise documented below in the visit note. 

## 2014-07-22 ENCOUNTER — Other Ambulatory Visit: Payer: Self-pay | Admitting: Family Medicine

## 2014-07-22 MED ORDER — HYDROCORTISONE 2.5 % EX CREA: TOPICAL_CREAM | Freq: Two times a day (BID) | CUTANEOUS | Status: DC | PRN

## 2014-07-22 MED ORDER — HYDROCORTISONE 1 % EX CREA: 1.0000 "application " | TOPICAL_CREAM | Freq: Two times a day (BID) | CUTANEOUS | Status: DC | PRN

## 2014-07-22 NOTE — Telephone Encounter (Signed)
Pt requested refill of Rx, she forgot to get Dr. Glori Bickers to refill Rx at her CPE this week, Rx refilled and pt notified

## 2014-07-22 NOTE — Addendum Note (Signed)
Addended by: Tammi Sou on: 07/22/2014 11:35 AM   Modules accepted: Orders

## 2014-07-22 NOTE — Telephone Encounter (Signed)
Pt called and advised me that she needed Rx for the 2.5% not 1%, Rx changed

## 2014-07-26 ENCOUNTER — Other Ambulatory Visit: Payer: Self-pay | Admitting: Family Medicine

## 2014-07-26 NOTE — Telephone Encounter (Signed)
Electronic refill request. Last Filled:   120 tablet 0 RF on 06/11/14.  Please advise.

## 2014-07-26 NOTE — Telephone Encounter (Signed)
Rx called to pharmacy as instructed. 

## 2014-07-26 NOTE — Telephone Encounter (Signed)
Px written for call in   

## 2014-08-25 ENCOUNTER — Other Ambulatory Visit: Payer: Self-pay | Admitting: Family Medicine

## 2014-08-25 NOTE — Telephone Encounter (Signed)
Electronic refill request, please advise  

## 2014-08-25 NOTE — Telephone Encounter (Signed)
Px written for call in   

## 2014-08-25 NOTE — Telephone Encounter (Signed)
Rx called in as prescribed 

## 2014-09-04 ENCOUNTER — Other Ambulatory Visit: Payer: Self-pay | Admitting: Family Medicine

## 2014-09-06 NOTE — Telephone Encounter (Signed)
Electronic refill request, last refilled 07/26/14 with no refills, please advise

## 2014-09-06 NOTE — Telephone Encounter (Signed)
Please refill times 1 

## 2014-09-07 NOTE — Telephone Encounter (Signed)
Rx called in as prescribed 

## 2014-09-22 ENCOUNTER — Ambulatory Visit: Payer: Medicare Other

## 2014-09-29 ENCOUNTER — Ambulatory Visit (INDEPENDENT_AMBULATORY_CARE_PROVIDER_SITE_OTHER): Payer: Medicare Other

## 2014-09-29 DIAGNOSIS — Z23 Encounter for immunization: Secondary | ICD-10-CM

## 2014-10-07 ENCOUNTER — Other Ambulatory Visit: Payer: Self-pay | Admitting: Family Medicine

## 2014-10-07 NOTE — Telephone Encounter (Signed)
Px written for call in   

## 2014-10-07 NOTE — Telephone Encounter (Signed)
Rx called in as prescribed 

## 2014-10-07 NOTE — Telephone Encounter (Signed)
Electronic refill request, please advise  

## 2014-12-10 ENCOUNTER — Telehealth: Payer: Self-pay

## 2014-12-10 DIAGNOSIS — Z1211 Encounter for screening for malignant neoplasm of colon: Secondary | ICD-10-CM

## 2014-12-10 NOTE — Telephone Encounter (Signed)
Pt left v/m; pt had cpx on 07/20/14 and is ready to have colonoscopy scheduled. Pt wants to go to [place] to have colonoscopy. Pt will wait for cb.

## 2014-12-10 NOTE — Telephone Encounter (Signed)
Error

## 2014-12-11 DIAGNOSIS — Z1211 Encounter for screening for malignant neoplasm of colon: Secondary | ICD-10-CM | POA: Insufficient documentation

## 2014-12-11 NOTE — Telephone Encounter (Signed)
Ref for colonoscopy Had polyp 1/11 I think

## 2014-12-21 ENCOUNTER — Other Ambulatory Visit: Payer: Self-pay | Admitting: Family Medicine

## 2014-12-21 NOTE — Telephone Encounter (Signed)
Electronic refill request, please advise  

## 2014-12-21 NOTE — Telephone Encounter (Signed)
Rx called in as prescribed 

## 2014-12-21 NOTE — Telephone Encounter (Signed)
Px written for call in   

## 2015-01-12 ENCOUNTER — Encounter: Payer: Self-pay | Admitting: Family Medicine

## 2015-01-17 ENCOUNTER — Encounter: Payer: Self-pay | Admitting: Family Medicine

## 2015-01-18 ENCOUNTER — Encounter: Payer: Self-pay | Admitting: Internal Medicine

## 2015-02-17 ENCOUNTER — Other Ambulatory Visit: Payer: Self-pay

## 2015-02-17 MED ORDER — ZOLPIDEM TARTRATE 10 MG PO TABS: 10.0000 mg | ORAL_TABLET | Freq: Every evening | ORAL | Status: DC | PRN

## 2015-02-17 NOTE — Telephone Encounter (Signed)
Pt left v/m requesting refill ambien to [facility]; pt request cb. Pt last seen 07/20/14.

## 2015-02-17 NOTE — Telephone Encounter (Signed)
Px written for call in   

## 2015-02-17 NOTE — Telephone Encounter (Signed)
Called in rx to [facility].

## 2015-02-18 ENCOUNTER — Other Ambulatory Visit: Payer: Self-pay | Admitting: *Deleted

## 2015-02-18 NOTE — Telephone Encounter (Signed)
Can you clarify that ? It looks like we did ambien yesterday - ? What do they need

## 2015-02-18 NOTE — Telephone Encounter (Signed)
Received a faxed from [facility] for refill request on  zolpidem (AMBIEN) 10 MG tablet. They have noted, We are asking for a new rx for this medication **Not to filled now, but at the end of the month** [facility] with no refills and we are just trying to make easy on Ms. Haggar.  Please advise.

## 2015-02-21 NOTE — Telephone Encounter (Signed)
Yes, we did. I'm sorry. I called [facility] to double check made sure they got it. We are good. It was a request from patient and then pharmacy.

## 2015-02-21 NOTE — Telephone Encounter (Signed)
Great, thanks -deny it then

## 2015-02-25 ENCOUNTER — Telehealth: Payer: Self-pay

## 2015-02-25 NOTE — Telephone Encounter (Signed)
Pt left v/m; pt has been taking zolpidem for years and now  [facility] request cb at [phone] to make exception to pts plan to request pt to be able to continue zolpidem even though pt is over age 69. Pt request cb when this is completed.

## 2015-03-01 NOTE — Telephone Encounter (Signed)
Called [employer] regarding zolpidem.  Pending approval.  Left message informing patient.

## 2015-03-01 NOTE — Telephone Encounter (Signed)
Zolpidem rx was denied by insurance, denial forms placed in your inbox.

## 2015-03-02 ENCOUNTER — Ambulatory Visit (AMBULATORY_SURGERY_CENTER): Payer: Self-pay | Admitting: *Deleted

## 2015-03-02 VITALS — Ht 64.0 in | Wt 115.6 lb

## 2015-03-02 DIAGNOSIS — Z86010 Personal history of colonic polyps: Secondary | ICD-10-CM

## 2015-03-02 MED ORDER — MOVIPREP 100 G PO SOLR: ORAL | Status: DC

## 2015-03-02 NOTE — Telephone Encounter (Signed)
Please have her come in for an appt - we will review her history in detail -and see what we can come up with either to put in a letter or if there may be other options  Thanks

## 2015-03-02 NOTE — Progress Notes (Signed)
No allergies to eggs or soy. No problems with anesthesia.  Pt given Emmi instructions for colonoscopy  No oxygen use  No diet drug use  

## 2015-03-02 NOTE — Telephone Encounter (Signed)
That sounds fine, thanks  

## 2015-03-02 NOTE — Telephone Encounter (Signed)
Spoke with [facility] and they say there are no alternatives, but we can appeal the zolpidem denial.

## 2015-03-02 NOTE — Telephone Encounter (Signed)
Patient notified as instructed by telephone and verbalized understanding. Patient stated that she is going to continue to take Zolpidem and will just pay out of pocket for it. Patient stated that she will discuss this the next time she comes in for an appointment.

## 2015-03-16 ENCOUNTER — Ambulatory Visit (AMBULATORY_SURGERY_CENTER): Payer: Medicare Other | Admitting: Internal Medicine

## 2015-03-16 ENCOUNTER — Encounter: Payer: Self-pay | Admitting: Internal Medicine

## 2015-03-16 VITALS — BP 99/51 | HR 80 | Temp 97.6°F | Resp 30 | Ht 64.0 in | Wt 115.0 lb

## 2015-03-16 DIAGNOSIS — D125 Benign neoplasm of sigmoid colon: Secondary | ICD-10-CM

## 2015-03-16 DIAGNOSIS — Z86010 Personal history of colonic polyps: Secondary | ICD-10-CM

## 2015-03-16 DIAGNOSIS — D120 Benign neoplasm of cecum: Secondary | ICD-10-CM | POA: Diagnosis not present

## 2015-03-16 MED ORDER — SODIUM CHLORIDE 0.9 % IV SOLN: 500.0000 mL | INTRAVENOUS | Status: DC

## 2015-03-16 NOTE — Patient Instructions (Signed)
YOU HAD AN ENDOSCOPIC PROCEDURE TODAY AT [facility]:   Refer to the procedure report that was given to you for any specific questions about what was found during the examination.  If the procedure report does not answer your questions, please call your gastroenterologist to clarify.  If you requested that your care partner not be given the details of your procedure findings, then the procedure report has been included in a sealed envelope for you to review at your convenience later.  YOU SHOULD EXPECT: Some feelings of bloating in the abdomen. Passage of more gas than usual.  Walking can help get rid of the air that was put into your GI tract during the procedure and reduce the bloating. If you had a lower endoscopy (such as a colonoscopy or flexible sigmoidoscopy) you may notice spotting of blood in your stool or on the toilet paper. If you underwent a bowel prep for your procedure, you may not have a normal bowel movement for a few days.  Please Note:  You might notice some irritation and congestion in your nose or some drainage.  This is from the oxygen used during your procedure.  There is no need for concern and it should clear up in a day or so.  SYMPTOMS TO REPORT IMMEDIATELY:   Following lower endoscopy (colonoscopy or flexible sigmoidoscopy):  Excessive amounts of blood in the stool  Significant tenderness or worsening of abdominal pains  Swelling of the abdomen that is new, acute  Fever of 100F or higher    For urgent or emergent issues, a gastroenterologist can be reached at any hour by calling [phone].   DIET: Your first meal following the procedure should be a small meal and then it is ok to progress to your normal diet. Heavy or fried foods are harder to digest and may make you feel nauseous or bloated.  Likewise, meals heavy in dairy and vegetables can increase bloating.  Drink plenty of fluids but you should avoid alcoholic beverages for 24  hours.  ACTIVITY:  You should plan to take it easy for the rest of today and you should NOT DRIVE or use heavy machinery until tomorrow (because of the sedation medicines used during the test).    FOLLOW UP: Our staff will call the number listed on your records the next business day following your procedure to check on you and address any questions or concerns that you may have regarding the information given to you following your procedure. If we do not reach you, we will leave a message.  However, if you are feeling well and you are not experiencing any problems, there is no need to return our call.  We will assume that you have returned to your regular daily activities without incident.  If any biopsies were taken you will be contacted by phone or by letter within the next 1-3 weeks.  Please call us at [phone] if you have not heard about the biopsies in 3 weeks.    SIGNATURES/CONFIDENTIALITY: You and/or your care partner have signed paperwork which will be entered into your electronic medical record.  These signatures attest to the fact that that the information above on your After Visit Summary has been reviewed and is understood.  Full responsibility of the confidentiality of this discharge information lies with you and/or your care-partner.  Polyp, diverticulosis,  High fiber diet information.

## 2015-03-16 NOTE — Progress Notes (Signed)
Called to room to assist during endoscopic procedure.  Patient ID and intended procedure confirmed with present staff. Received instructions for my participation in the procedure from the performing physician.  

## 2015-03-16 NOTE — Op Note (Signed)
[facility]  [address] [place], [id number]   COLONOSCOPY PROCEDURE REPORT  PATIENT: Wendy [name], Wendy [name]  MR#: [id number] BIRTHDATE: [birth year] , 68  yrs. old GENDER: female ENDOSCOPIST: Lafayette Dragon, MD REFERRED FW:YOVZC Vernell Morgans, M.D. PROCEDURE DATE:  03/16/2015 PROCEDURE:   Colonoscopy, surveillance and Colonoscopy with cold biopsy polypectomy First Screening Colonoscopy - Avg.  risk and is 50 yrs.  old or older - No.  Prior Negative Screening - Now for repeat screening. N/A  History of Adenoma - Now for follow-up colonoscopy & has been > or = to 3 yrs.  N/A ASA CLASS:   Class II INDICATIONS:prior colonoscopy 5 years ago in [place].  Polyp was removed.  No path report available. MEDICATIONS: Monitored anesthesia care and Propofol 350 mg IV  DESCRIPTION OF PROCEDURE:   After the risks benefits and alternatives of the procedure were thoroughly explained, informed consent was obtained.  The digital rectal exam revealed no abnormalities of the rectum.   The LB [id number]  endoscope was introduced through the anus and advanced to the cecum, which was identified by both the appendix and ileocecal valve. No adverse events experienced.   The quality of the prep was good.  (MoviPrep was used)  The instrument was then slowly withdrawn as the colon was fully examined.      COLON FINDINGS: Three sessile polyps measuring 3 mm in size were found in the sigmoid colon and at the cecum.  A polypectomy was performed with cold forceps.  The resection was complete, the polyp tissue was completely retrieved and sent to histology.   There was mild diverticulosis noted in the ascending colon and left colon. Retroflexed views revealed no abnormalities. The time to cecum = 5.45 Withdrawal time = 12.13   The scope was withdrawn and the procedure completed. COMPLICATIONS: There were no immediate complications.  ENDOSCOPIC IMPRESSION: 1.   Three sessile polypsmeasuring 3 mm  were found in the sigmoid colon x2 and at the cecum x1, polypectomy was performed with cold forceps 2.   Mild diverticulosis was noted in the ascending colon and left colon  RECOMMENDATIONS: 1.  Await pathology results 2.  High fiber diet Recall colonoscopy pending path report  eSigned:  Lafayette Dragon, MD 03/16/2015 8:34 AM   cc:   PATIENT NAME:  Wendy [name], Wendy [name] MR#: [id number]

## 2015-03-16 NOTE — Progress Notes (Signed)
Patient awakening,vss,report to rn 

## 2015-03-17 ENCOUNTER — Telehealth: Payer: Self-pay

## 2015-03-17 NOTE — Telephone Encounter (Signed)
  Follow up Call-  Call back number 03/16/2015  Post procedure Call Back phone  # [phone]  Permission to leave phone message Yes     Patient questions:  Do you have a fever, pain , or abdominal swelling? No. Pain Score  0 *  Have you tolerated food without any problems? Yes.    Have you been able to return to your normal activities? Yes.    Do you have any questions about your discharge instructions: Diet   No. Medications  No. Follow up visit  No.  Do you have questions or concerns about your Care? No.  Actions: * If pain score is 4 or above: No action needed, pain <4.

## 2015-03-22 ENCOUNTER — Other Ambulatory Visit: Payer: Self-pay | Admitting: Family Medicine

## 2015-03-22 ENCOUNTER — Encounter: Payer: Self-pay | Admitting: Internal Medicine

## 2015-03-22 NOTE — Telephone Encounter (Signed)
plz phone in. 

## 2015-03-22 NOTE — Telephone Encounter (Signed)
Rx called in as directed.   

## 2015-03-22 NOTE — Telephone Encounter (Signed)
Tramadol refill request.  Last seen 07/20/2014.  Last filled 12/21/2014.  Please advise.

## 2015-04-29 ENCOUNTER — Other Ambulatory Visit: Payer: Self-pay | Admitting: Family Medicine

## 2015-04-29 NOTE — Telephone Encounter (Signed)
Px written for call in   

## 2015-04-29 NOTE — Telephone Encounter (Signed)
Rx called in as prescribed 

## 2015-04-29 NOTE — Telephone Encounter (Signed)
Ok to refill 

## 2015-06-10 ENCOUNTER — Other Ambulatory Visit: Payer: Self-pay | Admitting: Family Medicine

## 2015-06-10 NOTE — Telephone Encounter (Signed)
Rx called in as prescribed 

## 2015-06-10 NOTE — Telephone Encounter (Signed)
Px written for call in   

## 2015-06-10 NOTE — Telephone Encounter (Signed)
Electronic refill request. Last refilled 04/29/15 #120 with 0 refills, pt has a CPE scheduled for 09/14/15, please advise

## 2015-07-18 ENCOUNTER — Other Ambulatory Visit: Payer: Self-pay | Admitting: Family Medicine

## 2015-07-18 NOTE — Telephone Encounter (Signed)
Electronic refill request, pt has CPE scheduled on 09/14/15, last refilled on 06/10/15 #120 with 0 refills, please advise

## 2015-07-18 NOTE — Telephone Encounter (Signed)
Rx called in as prescribed 

## 2015-07-18 NOTE — Telephone Encounter (Signed)
Px written for call in   

## 2015-07-21 ENCOUNTER — Other Ambulatory Visit: Payer: Self-pay | Admitting: Family Medicine

## 2015-07-21 NOTE — Telephone Encounter (Signed)
done

## 2015-07-21 NOTE — Telephone Encounter (Signed)
Electronic refill request, CPE scheduled for 09/14/15, please advise

## 2015-07-21 NOTE — Telephone Encounter (Signed)
Please refill to get by until appt

## 2015-08-03 ENCOUNTER — Other Ambulatory Visit: Payer: Self-pay

## 2015-08-03 DIAGNOSIS — Z1231 Encounter for screening mammogram for malignant neoplasm of breast: Secondary | ICD-10-CM

## 2015-08-19 ENCOUNTER — Other Ambulatory Visit: Payer: Self-pay | Admitting: *Deleted

## 2015-08-19 MED ORDER — ZOLPIDEM TARTRATE 10 MG PO TABS: 10.0000 mg | ORAL_TABLET | Freq: Every evening | ORAL | Status: DC | PRN

## 2015-08-19 NOTE — Telephone Encounter (Signed)
Pt left v/m requesting status of zolpidem refill request.pt request cb.

## 2015-08-19 NOTE — Telephone Encounter (Signed)
Px written for call in   

## 2015-08-19 NOTE — Telephone Encounter (Signed)
Fax refill request, pt has CPE scheduled on 09/14/15, last refilled on 02/17/15 #30 with 5 additional refills, please advise

## 2015-08-19 NOTE — Telephone Encounter (Signed)
Rx called in as prescribed 

## 2015-09-04 ENCOUNTER — Telehealth: Payer: Self-pay | Admitting: Family Medicine

## 2015-09-04 DIAGNOSIS — I10 Essential (primary) hypertension: Secondary | ICD-10-CM

## 2015-09-04 DIAGNOSIS — Z0000 Encounter for general adult medical examination without abnormal findings: Secondary | ICD-10-CM | POA: Insufficient documentation

## 2015-09-04 NOTE — Telephone Encounter (Signed)
-----   Message from Ellamae Sia sent at 09/01/2015  1:16 PM EDT ----- Regarding: Lab orders for Thursday, 9.15.16 Patient is scheduled for CPX labs, please order future labs, Thanks , Karna Christmas

## 2015-09-05 ENCOUNTER — Other Ambulatory Visit: Payer: Self-pay | Admitting: Family Medicine

## 2015-09-05 NOTE — Telephone Encounter (Signed)
Px written for call in   

## 2015-09-05 NOTE — Telephone Encounter (Signed)
Rx called in as prescribed 

## 2015-09-05 NOTE — Telephone Encounter (Signed)
Electronic refill request, pt has CPE scheduled on 09/14/15, last refilled on 07/18/15 #120 with 0 refills, please advise

## 2015-09-08 ENCOUNTER — Other Ambulatory Visit (INDEPENDENT_AMBULATORY_CARE_PROVIDER_SITE_OTHER): Payer: Medicare Other

## 2015-09-08 DIAGNOSIS — I10 Essential (primary) hypertension: Secondary | ICD-10-CM | POA: Diagnosis not present

## 2015-09-08 LAB — CBC WITH DIFFERENTIAL/PLATELET
Basophils Absolute: 0 10*3/uL (ref 0.0–0.1)
Basophils Relative: 0.7 % (ref 0.0–3.0)
Eosinophils Absolute: 0.2 10*3/uL (ref 0.0–0.7)
Eosinophils Relative: 3.3 % (ref 0.0–5.0)
HCT: 39.6 % (ref 36.0–46.0)
Hemoglobin: 13.2 g/dL (ref 12.0–15.0)
Lymphocytes Relative: 26.8 % (ref 12.0–46.0)
Lymphs Abs: 1.7 10*3/uL (ref 0.7–4.0)
MCHC: 33.3 g/dL (ref 30.0–36.0)
MCV: 95 fl (ref 78.0–100.0)
Monocytes Absolute: 0.7 10*3/uL (ref 0.1–1.0)
Monocytes Relative: 10.2 % (ref 3.0–12.0)
Neutro Abs: 3.8 10*3/uL (ref 1.4–7.7)
Neutrophils Relative %: 59 % (ref 43.0–77.0)
Platelets: 354 10*3/uL (ref 150.0–400.0)
RBC: 4.17 Mil/uL (ref 3.87–5.11)
RDW: 13.9 % (ref 11.5–15.5)
WBC: 6.5 10*3/uL (ref 4.0–10.5)

## 2015-09-08 LAB — COMPREHENSIVE METABOLIC PANEL
ALT: 9 U/L (ref 0–35)
AST: 23 U/L (ref 0–37)
Albumin: 4.2 g/dL (ref 3.5–5.2)
Alkaline Phosphatase: 50 U/L (ref 39–117)
BUN: 9 mg/dL (ref 6–23)
CO2: 27 mEq/L (ref 19–32)
Calcium: 9.2 mg/dL (ref 8.4–10.5)
Chloride: 95 mEq/L — ABNORMAL LOW (ref 96–112)
Creatinine, Ser: 0.83 mg/dL (ref 0.40–1.20)
GFR: 72.02 mL/min (ref >60.00)
Glucose, Bld: 97 mg/dL (ref 70–99)
Potassium: 4.4 mEq/L (ref 3.5–5.1)
Sodium: 130 mEq/L — ABNORMAL LOW (ref 135–145)
Total Bilirubin: 0.7 mg/dL (ref 0.2–1.2)
Total Protein: 6.9 g/dL (ref 6.0–8.3)

## 2015-09-08 LAB — LIPID PANEL
Cholesterol: 206 mg/dL — ABNORMAL HIGH (ref 0–200)
HDL: 95.1 mg/dL (ref >39.00)
LDL Cholesterol: 96 mg/dL (ref 0–99)
NonHDL: 111.17
Total CHOL/HDL Ratio: 2
Triglycerides: 74 mg/dL (ref 0.0–149.0)
VLDL: 14.8 mg/dL (ref 0.0–40.0)

## 2015-09-08 LAB — TSH: TSH: 3.3 u[IU]/mL (ref 0.35–4.50)

## 2015-09-12 ENCOUNTER — Ambulatory Visit
Admission: RE | Admit: 2015-09-12 | Discharge: 2015-09-12 | Disposition: A | Discharge status: Home / Self Care | Payer: Medicare Other | Source: Ambulatory Visit

## 2015-09-12 DIAGNOSIS — Z1231 Encounter for screening mammogram for malignant neoplasm of breast: Secondary | ICD-10-CM

## 2015-09-14 ENCOUNTER — Ambulatory Visit (INDEPENDENT_AMBULATORY_CARE_PROVIDER_SITE_OTHER): Payer: Medicare Other | Admitting: Family Medicine

## 2015-09-14 ENCOUNTER — Encounter: Payer: Self-pay | Admitting: *Deleted

## 2015-09-14 ENCOUNTER — Encounter: Payer: Self-pay | Admitting: Family Medicine

## 2015-09-14 ENCOUNTER — Encounter (INDEPENDENT_AMBULATORY_CARE_PROVIDER_SITE_OTHER): Payer: Self-pay

## 2015-09-14 VITALS — BP 128/72 | HR 91 | Temp 98.1°F | Ht 64.0 in | Wt 115.1 lb

## 2015-09-14 DIAGNOSIS — I10 Essential (primary) hypertension: Secondary | ICD-10-CM | POA: Diagnosis not present

## 2015-09-14 DIAGNOSIS — E871 Hypo-osmolality and hyponatremia: Secondary | ICD-10-CM

## 2015-09-14 DIAGNOSIS — Z0000 Encounter for general adult medical examination without abnormal findings: Secondary | ICD-10-CM

## 2015-09-14 DIAGNOSIS — Z23 Encounter for immunization: Secondary | ICD-10-CM

## 2015-09-14 DIAGNOSIS — M810 Age-related osteoporosis without current pathological fracture: Secondary | ICD-10-CM | POA: Diagnosis not present

## 2015-09-14 LAB — HM MAMMOGRAPHY: HM Mammogram: NORMAL

## 2015-09-14 NOTE — Patient Instructions (Addendum)
Flu shot today  Call us in the spring to refer you for a bone density test  Cut your water intake by 3-4 servings a day  Schedule non fasting labs for 1 month to re check sodium   Keep active -continue exercise

## 2015-09-14 NOTE — Progress Notes (Signed)
Pre visit review using our clinic review tool, if applicable. No additional management support is needed unless otherwise documented below in the visit note. 
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
## 2015-09-15 NOTE — Assessment & Plan Note (Signed)
Reviewed health habits including diet and exercise and skin cancer prevention Reviewed appropriate screening tests for age  Also reviewed health mt list, fam hx and immunization status , as well as social and family history   See HPI Labs reviewed Flu shot today  Call us in the spring to refer you for a bone density test  Cut your water intake by 3-4 servings a day  Schedule non fasting labs for 1 month to re check sodium   Keep active -continue exercise

## 2015-09-15 NOTE — Assessment & Plan Note (Signed)
Reviewed health habits including diet and exercise and skin cancer prevention Reviewed appropriate screening tests for age  Also reviewed health mt list, fam hx and immunization status , as well as social and family history   See HPI Labs reviewed Flu shot today  Call us in the spring to refer you for a bone density test  Cut your water intake by 3-4 servings a day  Schedule non fasting labs for 1 month to re check sodium

## 2015-09-15 NOTE — Assessment & Plan Note (Signed)
3rd year on boniva No falls or fx Pt wants to put off 2 y f/u dexa until spring-will call to schedule  Disc need for calcium/ vitamin D/ wt bearing exercise and bone density test every 2 y to monitor Disc safety/ fracture risk in detail

## 2015-09-15 NOTE — Assessment & Plan Note (Signed)
Sodium level is down again  Pt admits to drinking "too much" water  Will cut by 3-4 servings (this has helped in the past) Re check 1 mo  No symptoms Per pt -brother has the same problem

## 2015-09-15 NOTE — Assessment & Plan Note (Signed)
bp in fair control at this time  BP Readings from Last 1 Encounters:  09/14/15 128/72   No changes needed Disc lifstyle change with low sodium diet and exercise  Labs reviewed

## 2015-10-12 ENCOUNTER — Other Ambulatory Visit (INDEPENDENT_AMBULATORY_CARE_PROVIDER_SITE_OTHER): Payer: Medicare Other

## 2015-10-12 DIAGNOSIS — E871 Hypo-osmolality and hyponatremia: Secondary | ICD-10-CM

## 2015-10-12 LAB — BASIC METABOLIC PANEL
BUN: 15 mg/dL (ref 6–23)
CO2: 28 mEq/L (ref 19–32)
Calcium: 9.7 mg/dL (ref 8.4–10.5)
Chloride: 97 mEq/L (ref 96–112)
Creatinine, Ser: 0.91 mg/dL (ref 0.40–1.20)
GFR: 64.74 mL/min (ref >60.00)
Glucose, Bld: 102 mg/dL — ABNORMAL HIGH (ref 70–99)
Potassium: 3.8 mEq/L (ref 3.5–5.1)
Sodium: 135 mEq/L (ref 135–145)

## 2015-10-14 ENCOUNTER — Other Ambulatory Visit: Payer: Self-pay | Admitting: Family Medicine

## 2015-10-14 NOTE — Telephone Encounter (Signed)
Px written for call in   

## 2015-10-14 NOTE — Telephone Encounter (Signed)
Electronic refill request, last refilled on 09/05/15 #120 with 0 refills, pt has CPE on 09/14/15, please advise

## 2015-10-14 NOTE — Telephone Encounter (Signed)
Rx called in as prescribed 

## 2015-11-22 ENCOUNTER — Other Ambulatory Visit: Payer: Self-pay | Admitting: Family Medicine

## 2015-12-22 ENCOUNTER — Other Ambulatory Visit: Payer: Self-pay | Admitting: Family Medicine

## 2016-02-13 ENCOUNTER — Other Ambulatory Visit: Payer: Self-pay | Admitting: Family Medicine

## 2016-02-13 MED ORDER — ZOLPIDEM TARTRATE 10 MG PO TABS: 10.0000 mg | ORAL_TABLET | Freq: Every evening | ORAL | Status: DC | PRN

## 2016-02-13 NOTE — Telephone Encounter (Signed)
[facility] calling trying to get patients Zolpidem refilled.

## 2016-02-13 NOTE — Telephone Encounter (Signed)
Pt uses [facility].  She is having trouble getting a refill on her Zolpidem.  She has been trying to get this since last Thursday.  She needs this sent to [facility] asap.  Pt wants to know what she can do better to get this filled more easily next time.

## 2016-02-13 NOTE — Telephone Encounter (Signed)
Px written for call in   

## 2016-02-13 NOTE — Telephone Encounter (Signed)
See prev message, last refilled on 08/19/15 #30 with 5 additional refills, pt has CPE scheduled on 09/24/16, please advise

## 2016-02-13 NOTE — Telephone Encounter (Signed)
Rx called in as prescribed and pt notified  

## 2016-03-16 ENCOUNTER — Other Ambulatory Visit: Payer: Self-pay | Admitting: Family Medicine

## 2016-03-16 NOTE — Telephone Encounter (Signed)
Electronic refill request, pt has CPE scheduled on 09/24/16, last refilled on 10/14/15 #120 with 3 additional refills, please advise

## 2016-03-16 NOTE — Telephone Encounter (Signed)
Px written for call in   

## 2016-03-16 NOTE — Telephone Encounter (Signed)
Rx called in as prescribed 

## 2016-04-17 ENCOUNTER — Other Ambulatory Visit: Payer: Self-pay

## 2016-04-17 MED ORDER — ALCLOMETASONE DIPROPIONATE 0.05 % EX OINT: TOPICAL_OINTMENT | CUTANEOUS | Status: DC

## 2016-04-17 NOTE — Telephone Encounter (Signed)
Please refill times 3 

## 2016-04-17 NOTE — Telephone Encounter (Signed)
Rx sent 

## 2016-04-17 NOTE — Telephone Encounter (Signed)
Pt left v/m requesting aclovate ointment by faxed to [facility] on [address]. Last refilled # 30 g x 4 on 07/20/14. Last annual 09/14/15.

## 2016-05-08 ENCOUNTER — Encounter: Payer: Self-pay | Admitting: Internal Medicine

## 2016-05-08 ENCOUNTER — Ambulatory Visit (INDEPENDENT_AMBULATORY_CARE_PROVIDER_SITE_OTHER): Payer: Medicare Other | Admitting: Internal Medicine

## 2016-05-08 VITALS — BP 126/70 | HR 100 | Temp 98.0°F | Wt 108.2 lb

## 2016-05-08 DIAGNOSIS — M545 Low back pain, unspecified: Secondary | ICD-10-CM

## 2016-05-08 DIAGNOSIS — K5900 Constipation, unspecified: Secondary | ICD-10-CM

## 2016-05-08 NOTE — Progress Notes (Signed)
Subjective:    Patient ID: Wendy [name], female    DOB: 04-06-1946, 70 y.o.   MRN: [id number]  HPI  Pt presents to the clinic today with c/o back pain. This started 5 weeks ago. It started after she was working out in the yard, lifting heavy bags of mulch and bending over to plant flowers. She describes the pain as sore, achy and stiff. The pain does not radiate into her legs. She denies numbness, tingling or loss of bowel or bladder. She has been taking 3-4 Tramadol a day for years, with some relief. She does have a history of degenerative disc disease and osteoporosis. She made an appt with [facility] for Friday.  She also reports constipation. She has not had a BM in 1 week. She does reports abdominal fullness, but denies pain, nausea, vomiting or blood in her stool. She has had issues with constipation in the past, and reports normally she will take 1 dose of Miralax and have good results. This time she has tried Dlucolax suppositories, Fleets enema, Mirilax and stool softeners without any relief. She is passing gas.  Review of Systems      Past Medical History  Diagnosis Date  . Hypertension   . GERD (gastroesophageal reflux disease)   . Osteoporosis   . Allergy   . Insomnia   . Foot fracture     with surgery  . Colon polyps   . History of miscarriage   . Hepatitis A     Viral - got better  . Hyponatremia   . Arthritis     Current Outpatient Prescriptions  Medication Sig Dispense Refill  . alclomethasone (ACLOVATE) 0.05 % ointment APPLY TO LIPS ONCE DAILY AS NEEDED FOR SEVERE CHAPPING--DO NOT USE LONGER THAN 1 WEEK 30 g 2  . amLODipine (NORVASC) 10 MG tablet TAKE 1 TABLET BY MOUTH DAILY 90 tablet 3  . benazepril (LOTENSIN) 20 MG tablet TAKE 1 TABLET BY MOUTH DAILY 90 tablet 3  . calcium carbonate (OS-CAL) 600 MG TABS Take 1,200 mg by mouth daily.     . Cholecalciferol (VITAMIN D) 2000 UNITS tablet Take 2,000 Units by mouth daily.      . Clobetasol Propionate  (TEMOVATE) 0.05 % external spray Apply 1 application topically daily as needed.    . fluticasone (FLONASE) 50 MCG/ACT nasal spray PLACE 2 SPRAYS IN EACH NOSTRIL DAILY 16 g 11  . hydrocortisone 2.5 % cream Apply topically 2 (two) times daily as needed. For itching 30 g 1  . ibandronate (BONIVA) 150 MG tablet TAKE 1 TABLET BY MOUTH EVERY MONTH 1 tablet 9  . mupirocin ointment (BACTROBAN) 2 % Apply topically 3 (three) times daily. (Patient not taking: Reported on 03/02/2015) 22 g 0  . ranitidine (ZANTAC) 150 MG tablet Take 150 mg by mouth daily as needed. For indigestion    . traMADol (ULTRAM) 50 MG tablet TAKE TWO TABLETS BY MOUTH EVERY 12 HOURS 120 tablet 3  . tretinoin (RETIN-A) 0.025 % cream Apply topically at bedtime.    Marland Kitchen zolpidem (AMBIEN) 10 MG tablet Take 1 tablet (10 mg total) by mouth at bedtime as needed for sleep. 30 tablet 5  . [DISCONTINUED] hydrochlorothiazide (HYDRODIURIL) 25 MG tablet Take 25 mg by mouth daily.       No current facility-administered medications for this visit.    Allergies  Allergen Reactions  . Butalbital-Aspirin-Caffeine Other (See Comments)    hallucinations  . Morphine And Related Other (See Comments)  Does not work  . Motrin [Ibuprofen]     GI upset  . Penicillins Other (See Comments)    As child; reaction unknown  . Sulfa Antibiotics     In childhood  . Zanaflex [Tizanidine Hcl] Other (See Comments)    Decreased BP  . Diphenhydramine Hcl Palpitations    restlessness    Family History  Problem Relation Age of Onset  . Cancer Mother 68    Lung (not entirely sure), Smoker, Drinker  . Alcohol abuse Mother   . Alcohol abuse Father   . Hyperlipidemia Father   . Heart disease Father 16    MI  . Heart disease Paternal Grandfather     MI  . Colon cancer Neg Hx     Social History   Social History  . Marital Status: Single    Spouse Name: N/A  . Number of Children: 1  . Years of Education: N/A   Occupational History  . Takes care of  Toddlers    Social History Main Topics  . Smoking status: Former Smoker -- 32 years    Types: Cigarettes    Quit date: 12/24/1993  . Smokeless tobacco: Never Used  . Alcohol Use: 4.2 oz/week    7 Glasses of wine per week     Comment: 1 glass of wine per day  . Drug Use: No  . Sexual Activity: Not on file   Other Topics Concern  . Not on file   Social History Narrative   Is divorced for years.   Is very active - - works on [employer] Todd[employer]   Twin grandsons - 27 months in [place].   Vegetarian     Constitutional: Denies fever, malaise, fatigue, headache or abrupt weight changes.  Respiratory: Denies difficulty breathing, shortness of breath, cough or sputum production.   Cardiovascular: Denies chest pain, chest tightness, palpitations or swelling in the hands or feet.  Gastrointestinal: Pt reports constipation. Denies abdominal pain, bloating, constipation, diarrhea or blood in the stool.  GU: Denies urgency, frequency, pain with urination, burning sensation, blood in urine, odor or discharge. Musculoskeletal: Pt reports back pain. Denies decrease in range of motion, difficulty with gait, muscle pain or joint  swelling.  Skin: Denies redness, rashes, lesions or ulcercations.  Neurological: Denies dizziness, difficulty with memory, difficulty with speech or problems with balance and coordination.    No other specific complaints in a complete review of systems (except as listed in HPI above).  Objective:   Physical Exam  BP 126/70 mmHg  Pulse 100  Temp(Src) 98 F (36.7 C) (Oral)  Wt 108 lb 4 oz (49.102 kg)  SpO2 98% Wt Readings from Last 3 Encounters:  05/08/16 108 lb 4 oz (49.102 kg)  09/14/15 115 lb 1.9 oz (52.218 kg)  03/16/15 115 lb (52.164 kg)    General: Appears her stated age, well developed, well nourished in NAD. Cardiovascular: Normal rate and rhythm. S1,S2 noted.  No murmur, rubs or gallops noted.  Pulmonary/Chest: Normal effort and  positive vesicular breath sounds. No respiratory distress. No wheezes, rales or ronchi noted.  Abdomen: Soft and nontender. Normal bowel sounds. No distention or masses noted.  DRE: Normal rectal tone. No stool noted in the rectal vault. Musculoskeletal: Normal flexion, extension and rotation of the spine. Mild tenderness noted over the lumbar spine. Strength 5/5 BLE. No difficulty with gait.  Neurological: Alert and oriented.    BMET    Component Value Date/Time  NA 135 10/12/2015 0828   K 3.8 10/12/2015 0828   CL 97 10/12/2015 0828   CO2 28 10/12/2015 0828   GLUCOSE 102* 10/12/2015 0828   BUN 15 10/12/2015 0828   CREATININE 0.91 10/12/2015 0828   CALCIUM 9.7 10/12/2015 0828   GFRNONAA >60 07/26/2010 0759   GFRAA  07/26/2010 0759    >60        The eGFR has been calculated using the MDRD equation. This calculation has not been validated in all clinical situations. eGFR's persistently <60 mL/min signify possible Chronic Kidney Disease.    Lipid Panel     Component Value Date/Time   CHOL 206* 09/08/2015 0900   TRIG 74.0 09/08/2015 0900   HDL 95.10 09/08/2015 0900   CHOLHDL 2 09/08/2015 0900   VLDL 14.8 09/08/2015 0900   LDLCALC 96 09/08/2015 0900    CBC    Component Value Date/Time   WBC 6.5 09/08/2015 0900   RBC 4.17 09/08/2015 0900   HGB 13.2 09/08/2015 0900   HCT 39.6 09/08/2015 0900   PLT 354.0 09/08/2015 0900   MCV 95.0 09/08/2015 0900   MCH 31.4 07/26/2010 0759   MCHC 33.3 09/08/2015 0900   RDW 13.9 09/08/2015 0900   LYMPHSABS 1.7 09/08/2015 0900   MONOABS 0.7 09/08/2015 0900   EOSABS 0.2 09/08/2015 0900   BASOSABS 0.0 09/08/2015 0900    Hgb A1C No results found for: HGBA1C       Assessment & Plan:   Low back pain:  Offered xray lumbar spine, but she prefers to wait until Friday and let ortho do it Discussed stretching exercises Advised her to use Aleve once a day and try to cut back on the Tramadol as that may be causing some of the  constipation issue  Constipation:  Start Mag Citrate orally as directed Use Dulcolax suppository once a day until you have a BM No concern for obtstruction at this time Increase water intake If no improvement, in 48 hours, will get KUB abdomen  RTC as needed or if symptoms persist or worsen

## 2016-05-08 NOTE — Progress Notes (Signed)
Pre visit review using our clinic review tool, if applicable. No additional management support is needed unless otherwise documented below in the visit note. 

## 2016-05-08 NOTE — Patient Instructions (Signed)

## 2016-07-31 ENCOUNTER — Other Ambulatory Visit: Payer: Self-pay

## 2016-07-31 MED ORDER — ZOLPIDEM TARTRATE 10 MG PO TABS: 10.0000 mg | ORAL_TABLET | Freq: Every evening | ORAL | 5 refills | Status: DC | PRN

## 2016-07-31 NOTE — Telephone Encounter (Signed)
Px written for call in   

## 2016-07-31 NOTE — Telephone Encounter (Signed)
Pt left v/m requesting zolpidem to [facility]. Last seen 05/08/16 and last refilled # 30 x 5 on 02/13/16. Pt request cb.

## 2016-07-31 NOTE — Telephone Encounter (Signed)
Rx called in as prescribed 

## 2016-08-15 ENCOUNTER — Other Ambulatory Visit: Payer: Self-pay | Admitting: *Deleted

## 2016-08-15 MED ORDER — TRAMADOL HCL 50 MG PO TABS: ORAL_TABLET | ORAL | 3 refills | Status: DC

## 2016-08-15 NOTE — Telephone Encounter (Signed)
Px written for call in   

## 2016-08-15 NOTE — Telephone Encounter (Signed)
Fax refill request, pt has CPE scheduled on 09/24/16, last filled on 03/16/16 #120 tabs with 3 additional refills, please advise

## 2016-08-16 NOTE — Telephone Encounter (Signed)
Rx called in as prescribed 

## 2016-09-07 ENCOUNTER — Other Ambulatory Visit: Payer: Self-pay | Admitting: Family Medicine

## 2016-09-07 DIAGNOSIS — Z1231 Encounter for screening mammogram for malignant neoplasm of breast: Secondary | ICD-10-CM

## 2016-09-13 ENCOUNTER — Telehealth: Payer: Self-pay | Admitting: Family Medicine

## 2016-09-13 DIAGNOSIS — I10 Essential (primary) hypertension: Secondary | ICD-10-CM

## 2016-09-13 NOTE — Telephone Encounter (Signed)
-----   Message from Ellamae Sia sent at 09/13/2016  2:44 PM EDT ----- Regarding: Lab orders for Tuesday, 9.26.17 Patient is scheduled for CPX labs, please order future labs, Thanks , Karna Christmas

## 2016-09-18 ENCOUNTER — Other Ambulatory Visit (INDEPENDENT_AMBULATORY_CARE_PROVIDER_SITE_OTHER): Payer: Medicare Other

## 2016-09-18 ENCOUNTER — Ambulatory Visit (INDEPENDENT_AMBULATORY_CARE_PROVIDER_SITE_OTHER): Payer: Medicare Other

## 2016-09-18 VITALS — BP 136/68 | HR 83 | Temp 98.5°F | Ht 63.5 in | Wt 113.2 lb

## 2016-09-18 DIAGNOSIS — Z0000 Encounter for general adult medical examination without abnormal findings: Secondary | ICD-10-CM | POA: Diagnosis not present

## 2016-09-18 DIAGNOSIS — Z23 Encounter for immunization: Secondary | ICD-10-CM | POA: Diagnosis not present

## 2016-09-18 DIAGNOSIS — I10 Essential (primary) hypertension: Secondary | ICD-10-CM

## 2016-09-18 LAB — CBC WITH DIFFERENTIAL/PLATELET
Basophils Absolute: 0 10*3/uL (ref 0.0–0.1)
Basophils Relative: 0.6 % (ref 0.0–3.0)
Eosinophils Absolute: 0.1 10*3/uL (ref 0.0–0.7)
Eosinophils Relative: 1.4 % (ref 0.0–5.0)
HCT: 38.5 % (ref 36.0–46.0)
Hemoglobin: 13.3 g/dL (ref 12.0–15.0)
Lymphocytes Relative: 25.1 % (ref 12.0–46.0)
Lymphs Abs: 1.3 10*3/uL (ref 0.7–4.0)
MCHC: 34.6 g/dL (ref 30.0–36.0)
MCV: 93.5 fl (ref 78.0–100.0)
Monocytes Absolute: 0.7 10*3/uL (ref 0.1–1.0)
Monocytes Relative: 13.3 % — ABNORMAL HIGH (ref 3.0–12.0)
Neutro Abs: 3 10*3/uL (ref 1.4–7.7)
Neutrophils Relative %: 59.6 % (ref 43.0–77.0)
Platelets: 310 10*3/uL (ref 150.0–400.0)
RBC: 4.12 Mil/uL (ref 3.87–5.11)
RDW: 13.5 % (ref 11.5–15.5)
WBC: 5 10*3/uL (ref 4.0–10.5)

## 2016-09-18 LAB — TSH: TSH: 2.11 u[IU]/mL (ref 0.35–4.50)

## 2016-09-18 LAB — COMPREHENSIVE METABOLIC PANEL
ALT: 10 U/L (ref 0–35)
AST: 20 U/L (ref 0–37)
Albumin: 4.1 g/dL (ref 3.5–5.2)
Alkaline Phosphatase: 58 U/L (ref 39–117)
BUN: 13 mg/dL (ref 6–23)
CO2: 29 mEq/L (ref 19–32)
Calcium: 9.1 mg/dL (ref 8.4–10.5)
Chloride: 98 mEq/L (ref 96–112)
Creatinine, Ser: 0.87 mg/dL (ref 0.40–1.20)
GFR: 68.01 mL/min (ref >60.00)
Glucose, Bld: 103 mg/dL — ABNORMAL HIGH (ref 70–99)
Potassium: 4 mEq/L (ref 3.5–5.1)
Sodium: 134 mEq/L — ABNORMAL LOW (ref 135–145)
Total Bilirubin: 0.8 mg/dL (ref 0.2–1.2)
Total Protein: 6.8 g/dL (ref 6.0–8.3)

## 2016-09-18 LAB — LIPID PANEL
Cholesterol: 183 mg/dL (ref 0–200)
HDL: 94.8 mg/dL (ref >39.00)
LDL Cholesterol: 75 mg/dL (ref 0–99)
NonHDL: 88.19
Total CHOL/HDL Ratio: 2
Triglycerides: 67 mg/dL (ref 0.0–149.0)
VLDL: 13.4 mg/dL (ref 0.0–40.0)
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
## 2016-09-18 NOTE — Patient Instructions (Signed)
Wendy [name] , Thank you for taking time to come for your Medicare Wellness Visit. I appreciate your ongoing commitment to your health goals. Please review the following plan we discussed and let me know if I can assist you in the future.   These are the goals we discussed: Goals    . Increase physical activity          Starting 09/18/2016, I will continue to walk or garden at least 60 min 4-5 days per week.        This is a list of the screening recommended for you and due dates:  Health Maintenance  Topic Date Due  . Mammogram  10/23/2016*  .  Hepatitis C: One time screening is recommended by Center for Disease Control  (CDC) for  adults born from [birth year] through 1967.   01/25/2021*  . Colon Cancer Screening  03/15/2020  . Tetanus Vaccine  01/14/2023  . Flu Shot  Addressed  . DEXA scan (bone density measurement)  Completed  . Shingles Vaccine  Completed  . Pneumonia vaccines  Completed  *Topic was postponed. The date shown is not the original due date.   Preventive Care for Adults  A healthy lifestyle and preventive care can promote health and wellness. Preventive health guidelines for adults include the following key practices.  . A routine yearly physical is a good way to check with your health care provider about your health and preventive screening. It is a chance to share any concerns and updates on your health and to receive a thorough exam.  . Visit your dentist for a routine exam and preventive care every 6 months. Brush your teeth twice a day and floss once a day. Good oral hygiene prevents tooth decay and gum disease.  . The frequency of eye exams is based on your age, health, family medical history, use  of contact lenses, and other factors. Follow your health care provider's ecommendations for frequency of eye exams.  . Eat a healthy diet. Foods like vegetables, fruits, whole grains, low-fat dairy products, and lean protein foods contain the nutrients you need without too  many calories. Decrease your intake of foods high in solid fats, added sugars, and salt. Eat the right amount of calories for you. Get information about a proper diet from your health care provider, if necessary.  . Regular physical exercise is one of the most important things you can do for your health. Most adults should get at least 150 minutes of moderate-intensity exercise (any activity that increases your heart rate and causes you to sweat) each week. In addition, most adults need muscle-strengthening exercises on 2 or more days a week.  Silver Sneakers may be a benefit available to you. To determine eligibility, you may visit the website: www.silversneakers.com or contact program at 820-093-4840 Mon-Fri between 8AM-8PM.   . Maintain a healthy weight. The body mass index (BMI) is a screening tool to identify possible weight problems. It provides an estimate of body fat based on height and weight. Your health care provider can find your BMI and can help you achieve or maintain a healthy weight.   For adults 20 years and older: ? A BMI below 18.5 is considered underweight. ? A BMI of 18.5 to 24.9 is normal. ? A BMI of 25 to 29.9 is considered overweight. ? A BMI of 30 and above is considered obese.   . Maintain normal blood lipids and cholesterol levels by exercising and minimizing your intake of saturated fat.  Eat a balanced diet with plenty of fruit and vegetables. Blood tests for lipids and cholesterol should begin at age 74 and be repeated every 5 years. If your lipid or cholesterol levels are high, you are over 50, or you are at high risk for heart disease, you may need your cholesterol levels checked more frequently. Ongoing high lipid and cholesterol levels should be treated with medicines if diet and exercise are not working.  . If you smoke, find out from your health care provider how to quit. If you do not use tobacco, please do not start.  . If you choose to drink alcohol, please  do not consume more than 2 drinks per day. One drink is considered to be 12 ounces (355 mL) of beer, 5 ounces (148 mL) of wine, or 1.5 ounces (44 mL) of liquor.  . If you are 35-51 years old, ask your health care provider if you should take aspirin to prevent strokes.  . Use sunscreen. Apply sunscreen liberally and repeatedly throughout the day. You should seek shade when your shadow is shorter than you. Protect yourself by wearing long sleeves, pants, a wide-brimmed hat, and sunglasses year round, whenever you are outdoors.  . Once a month, do a whole body skin exam, using a mirror to look at the skin on your back. Tell your health care provider of new moles, moles that have irregular borders, moles that are larger than a pencil eraser, or moles that have changed in shape or color.

## 2016-09-18 NOTE — Progress Notes (Signed)
Pre visit review using our clinic review tool, if applicable. No additional management support is needed unless otherwise documented below in the visit note. 

## 2016-09-18 NOTE — Progress Notes (Signed)
PCP notes:   Health maintenance:  Flu vaccine - administered Mammogram - pt has appt scheduled  Abnormal screenings:   Hearing - failed  Patient concerns:   None  Nurse concerns:  None  Next PCP appt:   09/24/16 @ 1030  I reviewed health advisor's note, was available for consultation, and agree with documentation and plan. Loura Pardon MD

## 2016-09-24 ENCOUNTER — Encounter: Payer: Self-pay | Admitting: Family Medicine

## 2016-09-24 ENCOUNTER — Ambulatory Visit (INDEPENDENT_AMBULATORY_CARE_PROVIDER_SITE_OTHER): Payer: Medicare Other | Admitting: Family Medicine

## 2016-09-24 VITALS — BP 132/70 | HR 91 | Temp 97.7°F | Ht 63.5 in | Wt 114.5 lb

## 2016-09-24 DIAGNOSIS — E2839 Other primary ovarian failure: Secondary | ICD-10-CM

## 2016-09-24 DIAGNOSIS — M810 Age-related osteoporosis without current pathological fracture: Secondary | ICD-10-CM

## 2016-09-24 DIAGNOSIS — I10 Essential (primary) hypertension: Secondary | ICD-10-CM

## 2016-09-24 DIAGNOSIS — E871 Hypo-osmolality and hyponatremia: Secondary | ICD-10-CM | POA: Diagnosis not present

## 2016-09-24 MED ORDER — AMLODIPINE BESYLATE 10 MG PO TABS: 10.0000 mg | ORAL_TABLET | Freq: Every day | ORAL | 3 refills | Status: DC

## 2016-09-24 MED ORDER — IBANDRONATE SODIUM 150 MG PO TABS: ORAL_TABLET | ORAL | 11 refills | Status: DC

## 2016-09-24 MED ORDER — BENAZEPRIL HCL 20 MG PO TABS: 20.0000 mg | ORAL_TABLET | Freq: Every day | ORAL | 3 refills | Status: DC

## 2016-09-24 MED ORDER — FLUTICASONE PROPIONATE 50 MCG/ACT NA SUSP: NASAL | 11 refills | Status: DC

## 2016-09-24 NOTE — Assessment & Plan Note (Signed)
Mild -watching  Recommending cutting water intake by 1-2 servings per day

## 2016-09-24 NOTE — Assessment & Plan Note (Signed)
bp in fair control at this time  BP Readings from Last 1 Encounters:  09/24/16 132/70   No changes needed Disc lifstyle change with low sodium diet and exercise  Labs reviewed

## 2016-09-24 NOTE — Patient Instructions (Signed)
Stop at check out for bone density test referral  No change in medications Take care of yourself  Make sure you eat enough protein avoid simple sugars

## 2016-09-24 NOTE — Assessment & Plan Note (Signed)
Due for dexa-will schedule  On boniva  Disc need for calcium/ vitamin D/ wt bearing exercise and bone density test every 2 y to monitor Disc safety/ fracture risk in detail   No new falls or fx Past hx of fractures
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
## 2016-09-24 NOTE — Progress Notes (Signed)
Pre visit review using our clinic review tool, if applicable. No additional management support is needed unless otherwise documented below in the visit note. 

## 2016-09-24 NOTE — Assessment & Plan Note (Signed)
Ref for dexa/ OP

## 2016-10-15 ENCOUNTER — Ambulatory Visit: Payer: Medicare Other

## 2016-10-15 ENCOUNTER — Ambulatory Visit
Admission: RE | Admit: 2016-10-15 | Discharge: 2016-10-15 | Disposition: A | Discharge status: Home / Self Care | Payer: Medicare Other | Source: Ambulatory Visit | Attending: Family Medicine | Admitting: Family Medicine

## 2016-10-15 DIAGNOSIS — E2839 Other primary ovarian failure: Secondary | ICD-10-CM

## 2016-10-15 DIAGNOSIS — Z1231 Encounter for screening mammogram for malignant neoplasm of breast: Secondary | ICD-10-CM

## 2016-10-15 LAB — HM DEXA SCAN

## 2016-10-15 LAB — HM MAMMOGRAPHY

## 2016-10-17 ENCOUNTER — Encounter: Payer: Self-pay | Admitting: *Deleted

## 2016-10-23 ENCOUNTER — Encounter: Payer: Self-pay | Admitting: *Deleted

## 2016-11-02 ENCOUNTER — Ambulatory Visit (INDEPENDENT_AMBULATORY_CARE_PROVIDER_SITE_OTHER): Payer: Medicare Other | Admitting: Orthopaedic Surgery

## 2016-11-02 ENCOUNTER — Encounter (INDEPENDENT_AMBULATORY_CARE_PROVIDER_SITE_OTHER): Payer: Self-pay | Admitting: Orthopaedic Surgery

## 2016-11-02 ENCOUNTER — Ambulatory Visit (INDEPENDENT_AMBULATORY_CARE_PROVIDER_SITE_OTHER): Payer: Medicare Other

## 2016-11-02 DIAGNOSIS — M19042 Primary osteoarthritis, left hand: Secondary | ICD-10-CM | POA: Diagnosis not present

## 2016-11-02 DIAGNOSIS — M19041 Primary osteoarthritis, right hand: Secondary | ICD-10-CM | POA: Diagnosis not present

## 2016-11-02 DIAGNOSIS — M79641 Pain in right hand: Secondary | ICD-10-CM
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
## 2016-11-03 ENCOUNTER — Encounter (INDEPENDENT_AMBULATORY_CARE_PROVIDER_SITE_OTHER): Payer: Self-pay | Admitting: Orthopaedic Surgery

## 2016-11-13 ENCOUNTER — Ambulatory Visit (INDEPENDENT_AMBULATORY_CARE_PROVIDER_SITE_OTHER): Payer: Medicare Other | Admitting: Physical Medicine and Rehabilitation

## 2016-11-13 ENCOUNTER — Encounter (INDEPENDENT_AMBULATORY_CARE_PROVIDER_SITE_OTHER): Payer: Self-pay | Admitting: Physical Medicine and Rehabilitation

## 2016-11-13 DIAGNOSIS — R202 Paresthesia of skin: Secondary | ICD-10-CM

## 2016-11-13 NOTE — Procedures (Signed)
EMG & NCV Findings: Evaluation of the left median motor nerve showed prolonged distal onset latency (4.3 ms) and decreased conduction velocity (Elbow-Wrist, 49 m/s).  The right median motor nerve showed prolonged distal onset latency (5.5 ms), reduced amplitude (3.0 mV), and decreased conduction velocity (Elbow-Wrist, 40 m/s).  The left median (across palm) sensory and the right median (across palm) sensory nerves showed prolonged distal peak latency (Wrist, L3.7, R6.1 ms).  All remaining nerves (as indicated in the following tables) were within normal limits.  Left vs. Right side comparison data for the median motor nerve indicates abnormal L-R latency difference (1.2 ms).    Needle evaluation of the right abductor pollicis brevis muscle showed increased insertional activity and slightly increased spontaneous activity.  All remaining muscles (as indicated in the following table) showed no evidence of electrical instability.    Impression: The above electrodiagnostic study is ABNORMAL and reveals evidence of:  1.  A severe right median nerve entrapment at the wrist (carpal tunnel syndrome) affecting sensory and motor components.  2.  A moderate left median nerve entrapment at the wrist (carpal tunnel syndrome) affecting sensory and motor components.  There is no significant electrodiagnostic evidence of any other focal nerve entrapment, brachial plexopathy or cervical radiculopathy. The above electrodiagnostic study is ABNORMAL and reveals evidence of    Recommendations: 1.  Follow-up with referring physician. 2.  Continue current management of symptoms. 3.  Suggest use of resting splint at night-time and as needed during the day on the left hand. 4.  Suggest surgical evaluation on thr right.   Nerve Conduction Studies Anti Sensory Summary Table   Stim Site NR Peak (ms) Norm Peak (ms) P-T Amp (V) Norm P-T Amp Site1 Site2 Delta-P (ms) Dist (cm) Vel (m/s) Norm Vel (m/s)  Left Median Acr Palm  Anti Sensory (2nd Digit)  33.5C  Wrist    *3.7 <3.6 46.0 >10 Wrist Palm 2.0 0.0    Palm    1.7 <2.0 89.2         Right Median Acr Palm Anti Sensory (2nd Digit)  33.2C  Wrist    *6.1 <3.6 24.9 >10 Wrist Palm 4.1 0.0    Palm    2.0 <2.0 20.2         Right Radial Anti Sensory (Base 1st Digit)  34.4C  Wrist    2.1 <3.1 43.2  Wrist Base 1st Digit 2.1 0.0    Right Ulnar Anti Sensory (5th Digit)  34C  Wrist    3.1 <3.7 40.9 >15.0 Wrist 5th Digit 3.1 14.0 45 >38   Motor Summary Table   Stim Site NR Onset (ms) Norm Onset (ms) O-P Amp (mV) Norm O-P Amp Site1 Site2 Delta-0 (ms) Dist (cm) Vel (m/s) Norm Vel (m/s)  Left Median Motor (Abd Poll Brev)  34.3C  Wrist    *4.3 <4.2 5.6 >5 Elbow Wrist 4.0 19.8 *49 >50  Elbow    8.3  4.8         Right Median Motor (Abd Poll Brev)  33.3C  Wrist    *5.5 <4.2 *3.0 >5 Elbow Wrist 4.8 19.0 *40 >50  Elbow    10.3  4.5         Right Ulnar Motor (Abd Dig Min)  32.6C  Wrist    3.0 <4.2 10.1 >3 B Elbow Wrist 3.0 19.5 65 >53  B Elbow    6.0  10.0  A Elbow B Elbow 1.1 10.0 91 >53  A Elbow    7.1  9.6          EMG   Side Muscle Nerve Root Ins Act Fibs Psw Amp Dur Poly Recrt Int Fraser Din Comment  Right Abd Poll Brev Median C8-T1 *Incr *1+ *1+ Nml Nml 0 Nml Nml   Right 1stDorInt Ulnar C8-T1 Nml Nml Nml Nml Nml 0 Nml Nml   Right PronatorTeres Median C6-7 Nml Nml Nml Nml Nml 0 Nml Nml   Right Biceps Musculocut C5-6 Nml Nml Nml Nml Nml 0 Nml Nml   Right Deltoid Axillary C5-6 Nml Nml Nml Nml Nml 0 Nml Nml     Nerve Conduction Studies Anti Sensory Left/Right Comparison   Stim Site L Lat (ms) R Lat (ms) L-R Lat (ms) L Amp (V) R Amp (V) L-R Amp (%) Site1 Site2 L Vel (m/s) R Vel (m/s) L-R Vel (m/s)  Median Acr Palm Anti Sensory (2nd Digit)  33.5C  Wrist *3.7 *6.1 2.4 46.0 24.9 45.9 Wrist Palm     Palm 1.7 2.0 0.3 89.2 20.2 77.4       Radial Anti Sensory (Base 1st Digit)  34.4C  Wrist  2.1   43.2  Wrist Base 1st Digit     Ulnar Anti Sensory (5th Digit)  34C   Wrist  3.1   40.9  Wrist 5th Digit  45    Motor Left/Right Comparison   Stim Site L Lat (ms) R Lat (ms) L-R Lat (ms) L Amp (mV) R Amp (mV) L-R Amp (%) Site1 Site2 L Vel (m/s) R Vel (m/s) L-R Vel (m/s)  Median Motor (Abd Poll Brev)  34.3C  Wrist *4.3 *5.5 *1.2 5.6 *3.0 46.4 Elbow Wrist *49 *40 9  Elbow 8.3 10.3 2.0 4.8 4.5 6.2       Ulnar Motor (Abd Dig Min)  32.6C  Wrist  3.0   10.1  B Elbow Wrist  65   B Elbow  6.0   10.0  A Elbow B Elbow  91   A Elbow  7.1   9.6
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
## 2016-11-14 ENCOUNTER — Telehealth: Payer: Self-pay

## 2016-11-14 NOTE — Telephone Encounter (Signed)
Please advise how much the prolia inj and admin fee is here at the office, thanks

## 2016-11-14 NOTE — Telephone Encounter (Signed)
Patient is calling to let you know that her insurance will cover the Prolia injection but she will have to pay a 25% copay. She is wanting to know how much it would cost for her to have the injection in office, or if she can just do the injection herself.   Call back number: [phone]

## 2016-11-19 ENCOUNTER — Encounter (INDEPENDENT_AMBULATORY_CARE_PROVIDER_SITE_OTHER): Payer: Self-pay | Admitting: Orthopaedic Surgery

## 2016-11-19 ENCOUNTER — Ambulatory Visit (INDEPENDENT_AMBULATORY_CARE_PROVIDER_SITE_OTHER): Payer: Medicare Other | Admitting: Orthopaedic Surgery

## 2016-11-19 DIAGNOSIS — G5602 Carpal tunnel syndrome, left upper limb: Secondary | ICD-10-CM | POA: Diagnosis not present

## 2016-11-19 DIAGNOSIS — G5601 Carpal tunnel syndrome, right upper limb: Secondary | ICD-10-CM | POA: Diagnosis not present
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
## 2016-11-23 DIAGNOSIS — G5601 Carpal tunnel syndrome, right upper limb: Secondary | ICD-10-CM

## 2016-11-23 DIAGNOSIS — G5602 Carpal tunnel syndrome, left upper limb: Secondary | ICD-10-CM | POA: Diagnosis not present

## 2016-11-26 NOTE — Telephone Encounter (Signed)
You will have to complete this one as I have not yet been trained and it is currently pending and in process

## 2016-11-26 NOTE — Telephone Encounter (Signed)
Insurance verification will need to be started prior to giving her injection and if a prior Josem Kaufmann is required, that will need to be taken care of as well.    I am sending this to [facility] as well, since she will be handling Prolial  Thank you.

## 2016-11-28 ENCOUNTER — Ambulatory Visit (INDEPENDENT_AMBULATORY_CARE_PROVIDER_SITE_OTHER): Payer: Medicare Other

## 2016-11-28 DIAGNOSIS — M25531 Pain in right wrist: Secondary | ICD-10-CM
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
## 2016-12-06 NOTE — Telephone Encounter (Signed)
I was saving this one so I would have an example for you when we went into the portal.  The [employer] portal trainer will show you how once he/she meets w/you.  Thank you!

## 2016-12-07 ENCOUNTER — Ambulatory Visit (INDEPENDENT_AMBULATORY_CARE_PROVIDER_SITE_OTHER): Payer: Medicare Other | Admitting: Orthopaedic Surgery

## 2016-12-07 DIAGNOSIS — G5601 Carpal tunnel syndrome, right upper limb: Secondary | ICD-10-CM | POA: Insufficient documentation

## 2016-12-07 NOTE — Progress Notes (Signed)
2 week postop visit doing well.  Denies pain.  Just a little numbness in the thumb.  Numbness in index and long fingers has resolved.  Hand exam is benign without signs of infection.  Well healed incision.  Sutures were removed today.  Increase hand use as tolerated.  No heavy lifting for 2 more weeks.  F/u 6 weeks for final visit.

## 2017-01-18 ENCOUNTER — Encounter (INDEPENDENT_AMBULATORY_CARE_PROVIDER_SITE_OTHER): Payer: Self-pay | Admitting: Orthopaedic Surgery

## 2017-01-18 ENCOUNTER — Ambulatory Visit (INDEPENDENT_AMBULATORY_CARE_PROVIDER_SITE_OTHER): Payer: Medicare Other | Admitting: Orthopaedic Surgery

## 2017-01-18 ENCOUNTER — Other Ambulatory Visit: Payer: Self-pay

## 2017-01-18 DIAGNOSIS — G5601 Carpal tunnel syndrome, right upper limb: Secondary | ICD-10-CM

## 2017-01-18 MED ORDER — ZOLPIDEM TARTRATE 10 MG PO TABS: 10.0000 mg | ORAL_TABLET | Freq: Every evening | ORAL | 0 refills | Status: DC | PRN

## 2017-01-18 NOTE — Telephone Encounter (Signed)
Pt left v/m requesting refill zolpidem to [facility]. Pt is leaving very early on 01/22/17 going to [place] and request cb when zolpidem sent to [facility]. Last refilled # 30 x 5 refills on 07/31/16; pt seen 09/24/16.

## 2017-01-18 NOTE — Telephone Encounter (Signed)
Pt said she accidentally "vacuumed up a few" while cleaning, and she also found one in her cat's litter box that the cat had taken off her night stand. She also said the Rx is for 30 tabs but some months do have 31 days so she doesn't have enough to cover a full month. Pt said she is taking only one a day but is going to be out of her medication on Sunday 01/20/17, and she is also going on vacation and not returning till 02/01/17, pt as that we filled medication today since she will be out on Sunday

## 2017-01-18 NOTE — Telephone Encounter (Signed)
I think she is asking for this early?   Would be due for it on 2/8 if I am correct (please verify that)  How long is her trip? Will she run out before she gets back?

## 2017-01-18 NOTE — Telephone Encounter (Signed)
Rx called in as prescribed, pt advised of Dr. Marliss Coots comments and verbalized understanding, pt said she will be more careful with med

## 2017-01-18 NOTE — Telephone Encounter (Signed)
Px written for call in   Since this is a controlled substance Please take great care in storing this and if necessary locking it up to protect it   (we are not supposed to refill lost or stolen medications)

## 2017-01-18 NOTE — Progress Notes (Signed)
Patient is 6 weeks status post right carpal tunnel release. She is doing well. She is still has just a little bit of persistent numbness in her right thumb. Her symptoms in her other fingers are all completely gone. She is very happy. The surgical scar is well-healed. Follow-up with me as needed.

## 2017-01-18 NOTE — Telephone Encounter (Signed)
Left voicemail on both contact #'s requesting pt to call the office back

## 2017-02-04 ENCOUNTER — Other Ambulatory Visit: Payer: Self-pay | Admitting: Family Medicine

## 2017-02-04 NOTE — Telephone Encounter (Signed)
Rx called in as prescribed 

## 2017-02-04 NOTE — Telephone Encounter (Signed)
Px written for call in   

## 2017-02-04 NOTE — Telephone Encounter (Signed)
CPE was on 09/24/16, last filled on 08/15/16 #120 tabs with 3 additional refills, please advise

## 2017-02-19 ENCOUNTER — Other Ambulatory Visit: Payer: Self-pay | Admitting: Family Medicine

## 2017-02-19 NOTE — Telephone Encounter (Signed)
CPE was on 09/24/16, last filled on 01/18/17 #30 tabs with 0 refills, please advise

## 2017-02-19 NOTE — Telephone Encounter (Signed)
Rx called in as prescribed 

## 2017-02-19 NOTE — Telephone Encounter (Signed)
Px written for call in   

## 2017-04-29 ENCOUNTER — Other Ambulatory Visit: Payer: Self-pay | Admitting: Family Medicine

## 2017-06-05 ENCOUNTER — Encounter (HOSPITAL_COMMUNITY): Payer: Self-pay | Admitting: Emergency Medicine

## 2017-06-05 DIAGNOSIS — I10 Essential (primary) hypertension: Secondary | ICD-10-CM | POA: Insufficient documentation

## 2017-06-05 DIAGNOSIS — R112 Nausea with vomiting, unspecified: Secondary | ICD-10-CM | POA: Diagnosis not present

## 2017-06-05 DIAGNOSIS — R1084 Generalized abdominal pain: Secondary | ICD-10-CM

## 2017-06-05 DIAGNOSIS — K567 Ileus, unspecified: Secondary | ICD-10-CM | POA: Diagnosis not present

## 2017-06-05 DIAGNOSIS — Z7983 Long term (current) use of bisphosphonates: Secondary | ICD-10-CM | POA: Insufficient documentation

## 2017-06-05 DIAGNOSIS — Z79899 Other long term (current) drug therapy: Secondary | ICD-10-CM | POA: Insufficient documentation

## 2017-06-05 DIAGNOSIS — Z87891 Personal history of nicotine dependence: Secondary | ICD-10-CM | POA: Insufficient documentation

## 2017-06-05 LAB — URINALYSIS, ROUTINE W REFLEX MICROSCOPIC
Bilirubin Urine: NEGATIVE
Glucose, UA: NEGATIVE mg/dL
Ketones, ur: 5 mg/dL — AB
Leukocytes, UA: NEGATIVE
Nitrite: NEGATIVE
Protein, ur: 100 mg/dL — AB
Specific Gravity, Urine: 1.018 (ref 1.005–1.030)
pH: 5 (ref 5.0–8.0)

## 2017-06-05 LAB — COMPREHENSIVE METABOLIC PANEL
ALT: 14 U/L (ref 14–54)
AST: 22 U/L (ref 15–41)
Albumin: 4.4 g/dL (ref 3.5–5.0)
Alkaline Phosphatase: 64 U/L (ref 38–126)
Anion gap: 13 (ref 5–15)
BUN: 13 mg/dL (ref 6–20)
CO2: 23 mmol/L (ref 22–32)
Calcium: 9.3 mg/dL (ref 8.9–10.3)
Chloride: 91 mmol/L — ABNORMAL LOW (ref 101–111)
Creatinine, Ser: 0.87 mg/dL (ref 0.44–1.00)
GFR calc Af Amer: >60 mL/min (ref >60)
GFR calc non Af Amer: >60 mL/min (ref >60)
Glucose, Bld: 131 mg/dL — ABNORMAL HIGH (ref 65–99)
Potassium: 3.7 mmol/L (ref 3.5–5.1)
Sodium: 127 mmol/L — ABNORMAL LOW (ref 135–145)
Total Bilirubin: 1.4 mg/dL — ABNORMAL HIGH (ref 0.3–1.2)
Total Protein: 7.5 g/dL (ref 6.5–8.1)

## 2017-06-05 LAB — CBC
HCT: 36.5 % (ref 36.0–46.0)
Hemoglobin: 12.7 g/dL (ref 12.0–15.0)
MCH: 31.3 pg (ref 26.0–34.0)
MCHC: 34.8 g/dL (ref 30.0–36.0)
MCV: 89.9 fL (ref 78.0–100.0)
Platelets: 364 10*3/uL (ref 150–400)
RBC: 4.06 MIL/uL (ref 3.87–5.11)
RDW: 12.7 % (ref 11.5–15.5)
WBC: 12.6 10*3/uL — ABNORMAL HIGH (ref 4.0–10.5)

## 2017-06-05 LAB — LIPASE, BLOOD: Lipase: 29 U/L (ref 11–51)

## 2017-06-05 MED ORDER — ONDANSETRON 4 MG PO TBDP
ORAL_TABLET | ORAL | Status: AC
  Administered 2017-06-05: 4 mg
  Filled 2017-06-05: qty 1

## 2017-06-05 MED ORDER — ONDANSETRON 4 MG PO TBDP: 4.0000 mg | ORAL_TABLET | Freq: Once | ORAL | Status: DC | PRN

## 2017-06-05 NOTE — ED Triage Notes (Signed)
Pt presents with no BM since Monday; pt states she has not passed flatulence either; pt denies hx of bowel obstruction; reports feeling bloated and sharp generalized abd pain; pt takes tramadol for chronic back pain

## 2017-06-06 ENCOUNTER — Emergency Department (HOSPITAL_COMMUNITY): Payer: Medicare Other

## 2017-06-06 ENCOUNTER — Emergency Department (HOSPITAL_COMMUNITY)
Admission: EM | Admit: 2017-06-06 | Discharge: 2017-06-06 | Disposition: A | Discharge status: Home / Self Care | Payer: Medicare Other | Source: Home / Self Care | Attending: Emergency Medicine | Admitting: Emergency Medicine

## 2017-06-06 DIAGNOSIS — R1084 Generalized abdominal pain: Secondary | ICD-10-CM

## 2017-06-06 IMAGING — CT CT ABD-PELV W/ CM
2 of 5 series · 16 of 46 positions shown, 18 images · IV contrast (iopamidol)
Comparison: None.

CLINICAL DATA: Acute onset of generalized abdominal pain. Initial
encounter.

EXAM:
CT ABDOMEN AND PELVIS WITH CONTRAST
TECHNIQUE: Multidetector CT imaging of the abdomen and pelvis was performed
using the standard protocol following bolus administration of
intravenous contrast.
CONTRAST:  100mL [2Q] IOPAMIDOL ([2Q]) INJECTION 61%

[Series 3: a/p w/ 5mm · axial · 0.69mm/px · z∈[+745,+1075]mm · 13 of 76 slices shown, 15 images]
[im 5/76  soft-tissue]
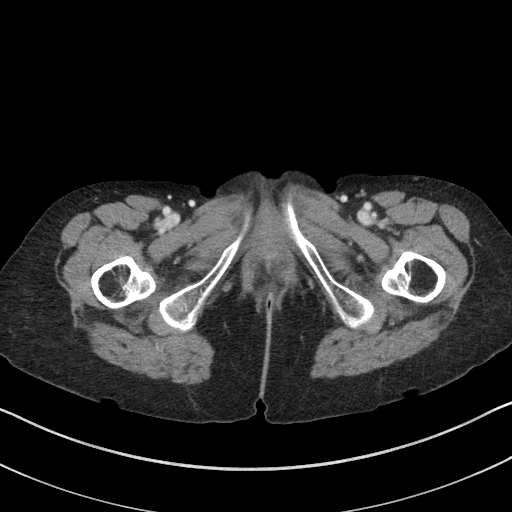
[im 5/76  bone]
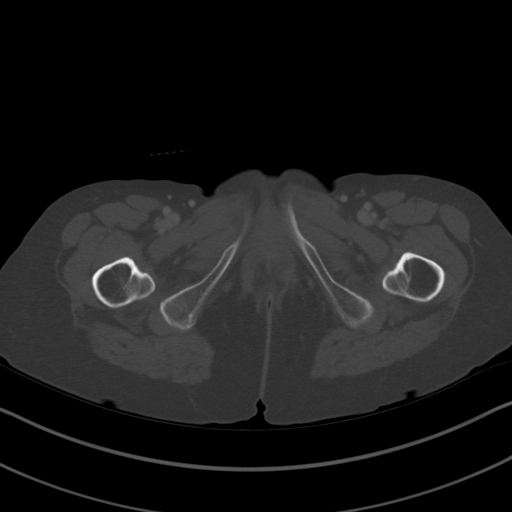
[im 9/76  soft-tissue]
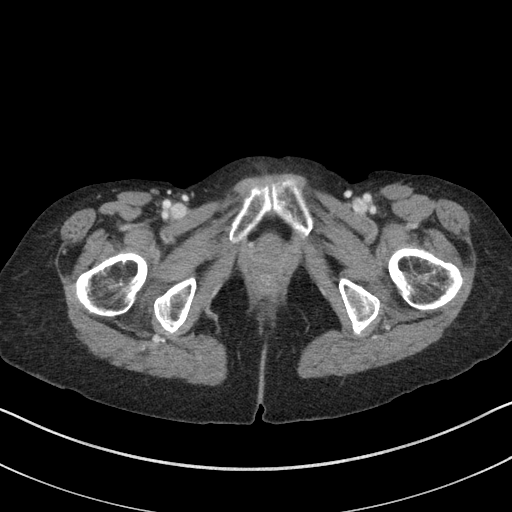
[im 18/76  soft-tissue]
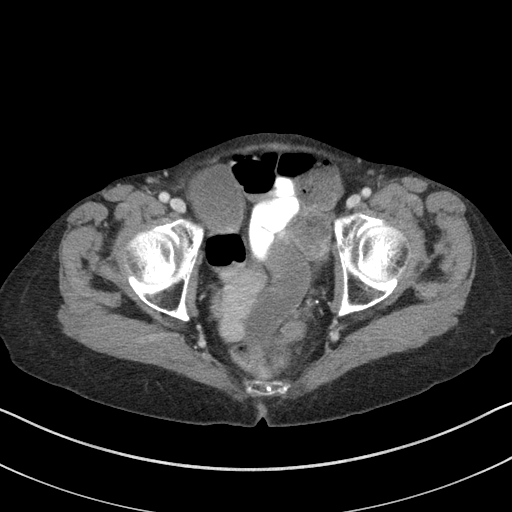
[im 23/76  soft-tissue]
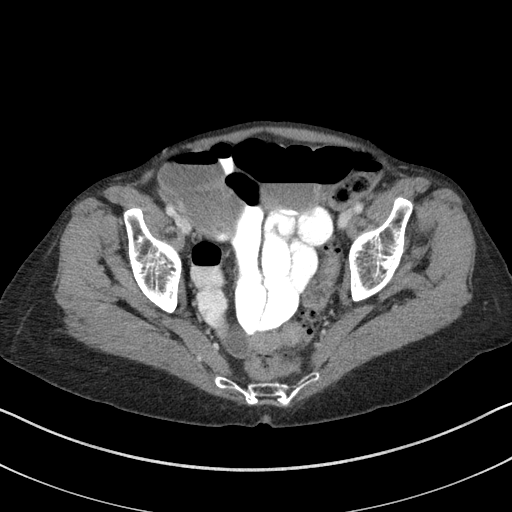
[im 27/76  soft-tissue]
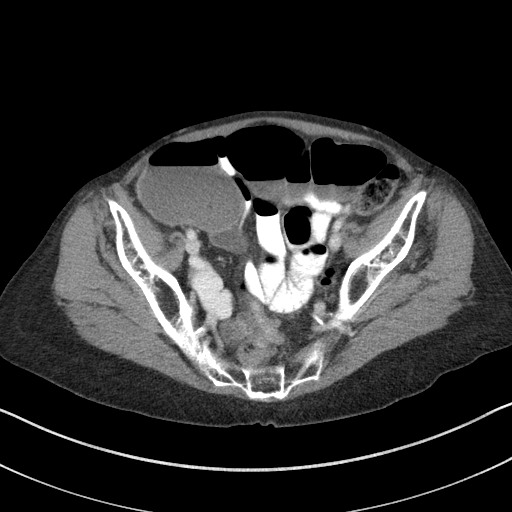
[im 31/76  soft-tissue]
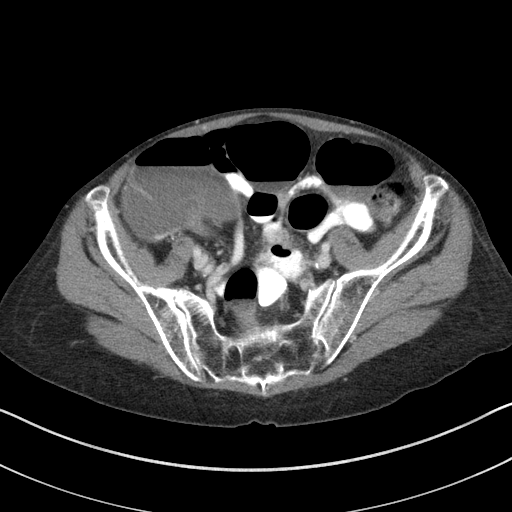
[im 40/76  soft-tissue]
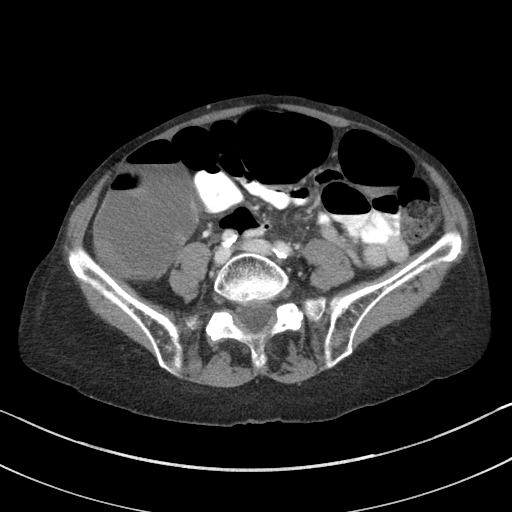
[im 45/76  soft-tissue]
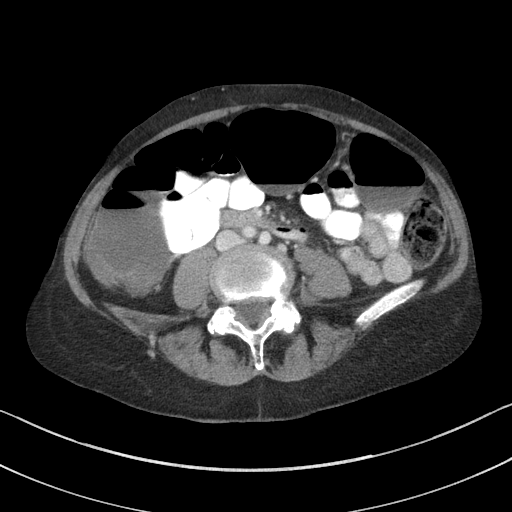
[im 49/76  soft-tissue]
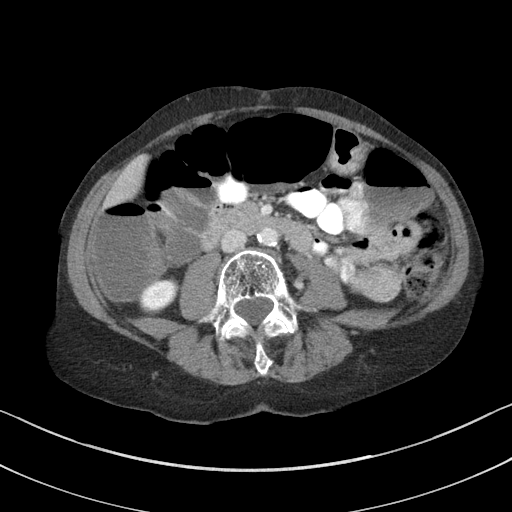
[im 49/76  bone]
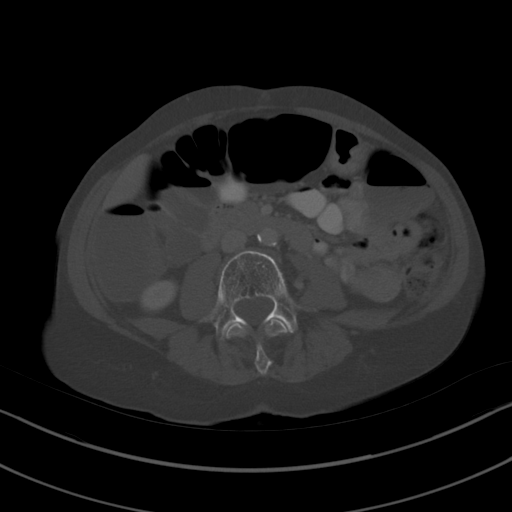
[im 53/76  soft-tissue]
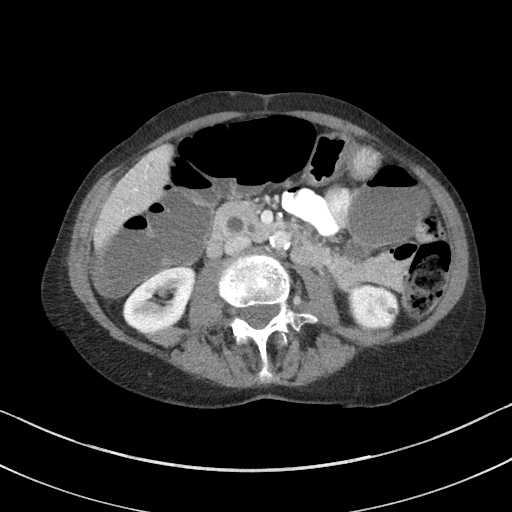
[im 58/76  soft-tissue]
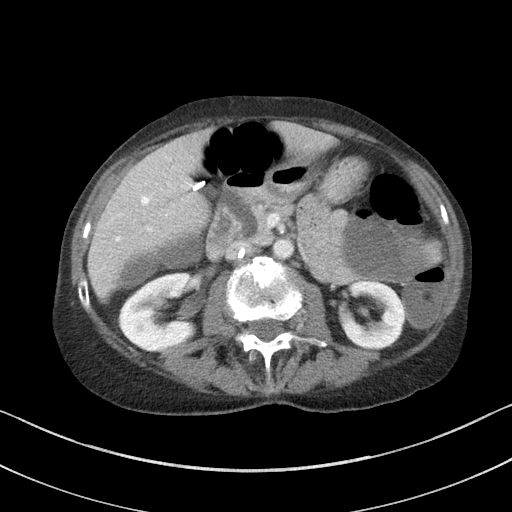
[im 67/76  soft-tissue]
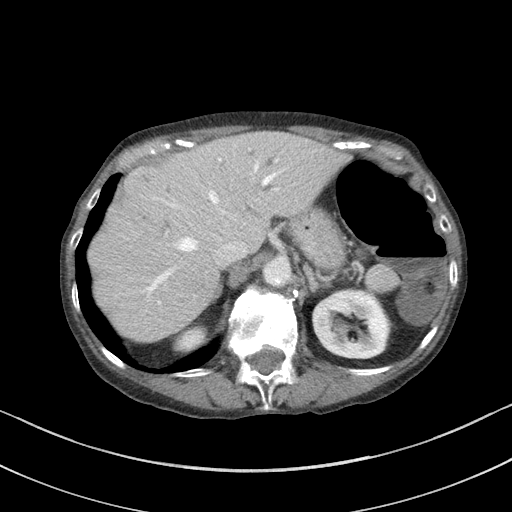
[im 71/76  soft-tissue]
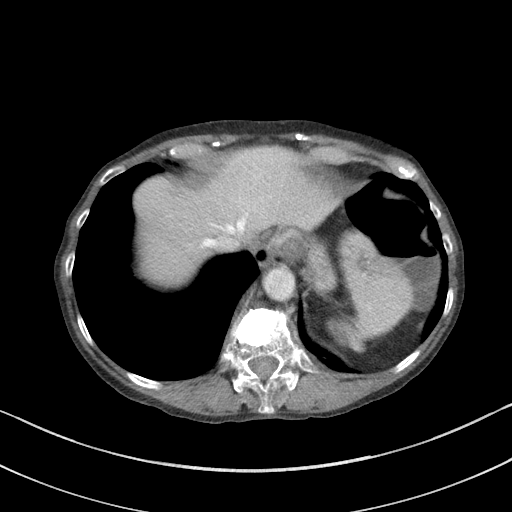

[Series 6: a/p w/ cor · coronal · 0.68mm/px · 3 of 114 slices shown]
[im 38/114  soft-tissue]
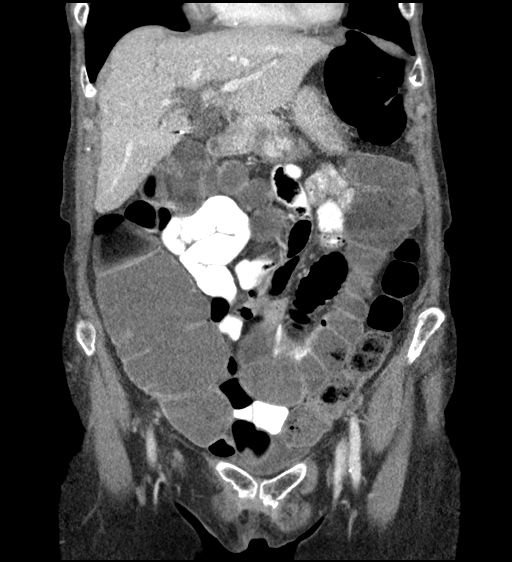
[im 51/114  soft-tissue]
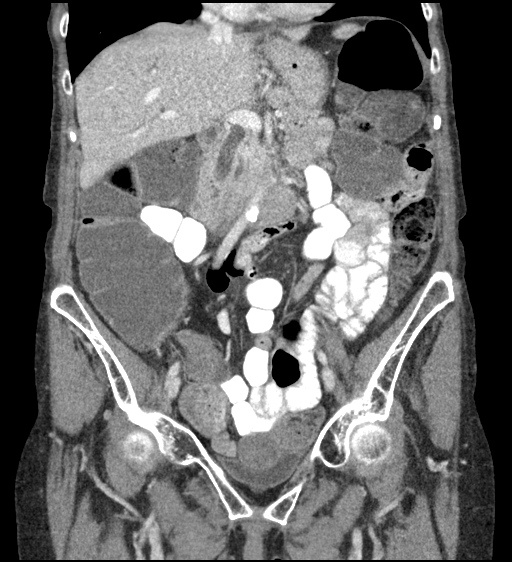
[im 63/114  soft-tissue]
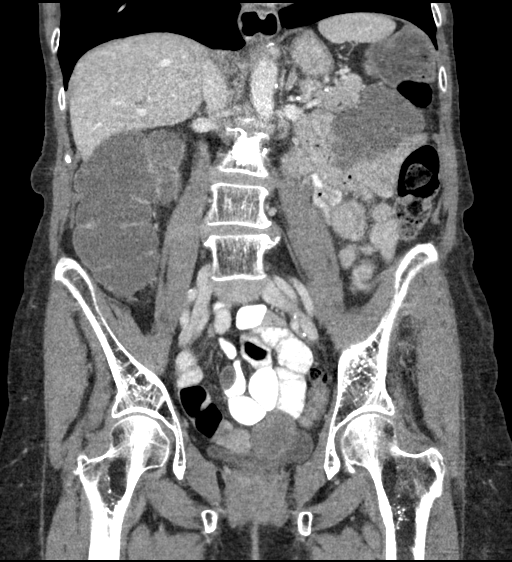

[16 of 46 positions shown; findings below may reference images not displayed]

FINDINGS: Lower chest: The visualized lung bases are grossly clear. The
visualized portions of the mediastinum are unremarkable.

Hepatobiliary: Diffuse intrahepatic biliary ductal dilatation
remains within normal limits status post cholecystectomy. The liver
is unremarkable in appearance, aside from mild fatty infiltration
about the falciform ligament.

Pancreas: The pancreas is within normal limits.

Spleen: The spleen is unremarkable in appearance.

Adrenals/Urinary Tract: The adrenal glands are unremarkable in
appearance.

Small left renal cysts are noted. The kidneys are otherwise
unremarkable. There is no evidence of hydronephrosis. No renal or
ureteral stones are identified. No perinephric stranding is seen.

Stomach/Bowel: The stomach is unremarkable in appearance. The small
bowel is within normal limits. The appendix is normal in caliber,
without evidence of appendicitis.

There is mild diffuse distention of the colon, concerning for mild
ileus. Mild diverticulosis is noted at the sigmoid colon, without
evidence of diverticulitis.

Vascular/Lymphatic: Scattered calcification is seen along the
abdominal aorta and its branches. The abdominal aorta is otherwise
grossly unremarkable. The inferior vena cava is grossly
unremarkable. No retroperitoneal lymphadenopathy is seen. No pelvic
sidewall lymphadenopathy is identified.

Reproductive: The bladder is relatively decompressed and not well
assessed. The patient is status post hysterectomy. No suspicious
adnexal masses are seen.

Other: No additional soft tissue abnormalities are seen.

Musculoskeletal: No acute osseous abnormalities are identified. The
patient is status post vertebroplasty at T12-L3, with underlying
chronic compression deformity at L1. The visualized musculature is
unremarkable in appearance.
IMPRESSION: 1. No acute abnormality seen to explain the patient's symptoms.
2. Mild diffuse colonic distention, concerning for mild ileus.
3. Mild diverticulosis at the sigmoid colon, without evidence of
diverticulitis.
4. Scattered aortic atherosclerosis.
5. Small left renal cysts noted.
6. Status post vertebroplasty at T12-L3, with underlying chronic
compression deformity at L1.

## 2017-06-06 MED ORDER — SODIUM CHLORIDE 0.9 % IV BOLUS (SEPSIS)
1000.0000 mL | Freq: Once | INTRAVENOUS | Status: AC
  Administered 2017-06-06: 1000 mL via INTRAVENOUS

## 2017-06-06 MED ORDER — IOPAMIDOL (ISOVUE-300) INJECTION 61%
INTRAVENOUS | Status: AC
  Filled 2017-06-06: qty 30

## 2017-06-06 MED ORDER — ONDANSETRON HCL 4 MG/2ML IJ SOLN
4.0000 mg | Freq: Once | INTRAMUSCULAR | Status: AC
  Administered 2017-06-06: 4 mg via INTRAVENOUS
  Filled 2017-06-06: qty 2

## 2017-06-06 MED ORDER — HYDROMORPHONE HCL 1 MG/ML IJ SOLN
0.5000 mg | Freq: Once | INTRAMUSCULAR | Status: AC
  Administered 2017-06-06: 0.5 mg via INTRAVENOUS
  Filled 2017-06-06: qty 1

## 2017-06-06 MED ORDER — ONDANSETRON 4 MG PO TBDP: 4.0000 mg | ORAL_TABLET | Freq: Three times a day (TID) | ORAL | 0 refills | Status: DC | PRN

## 2017-06-06 MED ORDER — IOPAMIDOL (ISOVUE-300) INJECTION 61%
INTRAVENOUS | Status: AC
  Administered 2017-06-06: 100 mL
  Filled 2017-06-06: qty 100

## 2017-06-06 NOTE — Discharge Instructions (Signed)
TAKE 8 CAPFULS OF MIRALAX IN A 32 OUNCE GATORADE AND DRINK THE WHOLE BEVERAGE.  Also try a fleets enema

## 2017-06-06 NOTE — ED Notes (Signed)
Pt transported to CT ?
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
## 2017-06-06 NOTE — ED Notes (Signed)
Pt back from CT

## 2017-06-07 ENCOUNTER — Inpatient Hospital Stay (HOSPITAL_COMMUNITY)
Admission: EM | Admit: 2017-06-07 | Discharge: 2017-06-09 | DRG: 389 | Disposition: A | Discharge status: Home / Self Care | Payer: Medicare Other | Attending: Internal Medicine | Admitting: Internal Medicine

## 2017-06-07 ENCOUNTER — Inpatient Hospital Stay (HOSPITAL_COMMUNITY): Payer: Medicare Other

## 2017-06-07 ENCOUNTER — Ambulatory Visit: Payer: Self-pay | Admitting: Physician Assistant

## 2017-06-07 ENCOUNTER — Encounter (HOSPITAL_COMMUNITY): Payer: Self-pay

## 2017-06-07 ENCOUNTER — Telehealth: Payer: Self-pay | Admitting: Family Medicine

## 2017-06-07 DIAGNOSIS — M810 Age-related osteoporosis without current pathological fracture: Secondary | ICD-10-CM | POA: Diagnosis present

## 2017-06-07 DIAGNOSIS — K9189 Other postprocedural complications and disorders of digestive system: Secondary | ICD-10-CM

## 2017-06-07 DIAGNOSIS — E876 Hypokalemia: Secondary | ICD-10-CM | POA: Diagnosis not present

## 2017-06-07 DIAGNOSIS — Z8719 Personal history of other diseases of the digestive system: Secondary | ICD-10-CM | POA: Diagnosis present

## 2017-06-07 DIAGNOSIS — Z79899 Other long term (current) drug therapy: Secondary | ICD-10-CM

## 2017-06-07 DIAGNOSIS — Z90710 Acquired absence of both cervix and uterus: Secondary | ICD-10-CM | POA: Diagnosis not present

## 2017-06-07 DIAGNOSIS — K219 Gastro-esophageal reflux disease without esophagitis: Secondary | ICD-10-CM | POA: Diagnosis present

## 2017-06-07 DIAGNOSIS — R739 Hyperglycemia, unspecified: Secondary | ICD-10-CM | POA: Diagnosis present

## 2017-06-07 DIAGNOSIS — I10 Essential (primary) hypertension: Secondary | ICD-10-CM | POA: Diagnosis present

## 2017-06-07 DIAGNOSIS — M19042 Primary osteoarthritis, left hand: Secondary | ICD-10-CM | POA: Diagnosis not present

## 2017-06-07 DIAGNOSIS — M1990 Unspecified osteoarthritis, unspecified site: Secondary | ICD-10-CM | POA: Diagnosis present

## 2017-06-07 DIAGNOSIS — K567 Ileus, unspecified: Secondary | ICD-10-CM | POA: Diagnosis present

## 2017-06-07 DIAGNOSIS — G8929 Other chronic pain: Secondary | ICD-10-CM | POA: Diagnosis present

## 2017-06-07 DIAGNOSIS — E871 Hypo-osmolality and hyponatremia: Secondary | ICD-10-CM | POA: Diagnosis present

## 2017-06-07 DIAGNOSIS — R112 Nausea with vomiting, unspecified: Secondary | ICD-10-CM | POA: Diagnosis present

## 2017-06-07 DIAGNOSIS — Z87891 Personal history of nicotine dependence: Secondary | ICD-10-CM

## 2017-06-07 DIAGNOSIS — M19041 Primary osteoarthritis, right hand: Secondary | ICD-10-CM | POA: Diagnosis not present

## 2017-06-07 LAB — COMPREHENSIVE METABOLIC PANEL
ALT: 12 U/L — ABNORMAL LOW (ref 14–54)
AST: 25 U/L (ref 15–41)
Albumin: 4.3 g/dL (ref 3.5–5.0)
Alkaline Phosphatase: 59 U/L (ref 38–126)
Anion gap: 10 (ref 5–15)
BUN: 10 mg/dL (ref 6–20)
CO2: 25 mmol/L (ref 22–32)
Calcium: 9 mg/dL (ref 8.9–10.3)
Chloride: 91 mmol/L — ABNORMAL LOW (ref 101–111)
Creatinine, Ser: 0.94 mg/dL (ref 0.44–1.00)
GFR calc Af Amer: >60 mL/min (ref >60)
GFR calc non Af Amer: 59 mL/min — ABNORMAL LOW (ref >60)
Glucose, Bld: 118 mg/dL — ABNORMAL HIGH (ref 65–99)
Potassium: 3.5 mmol/L (ref 3.5–5.1)
Sodium: 126 mmol/L — ABNORMAL LOW (ref 135–145)
Total Bilirubin: 1.3 mg/dL — ABNORMAL HIGH (ref 0.3–1.2)
Total Protein: 7 g/dL (ref 6.5–8.1)

## 2017-06-07 LAB — CBC
HCT: 34.9 % — ABNORMAL LOW (ref 36.0–46.0)
HCT: 35.4 % — ABNORMAL LOW (ref 36.0–46.0)
Hemoglobin: 12 g/dL (ref 12.0–15.0)
Hemoglobin: 12.1 g/dL (ref 12.0–15.0)
MCH: 31 pg (ref 26.0–34.0)
MCH: 30.8 pg (ref 26.0–34.0)
MCHC: 34.4 g/dL (ref 30.0–36.0)
MCHC: 34.2 g/dL (ref 30.0–36.0)
MCV: 90.2 fL (ref 78.0–100.0)
MCV: 90.1 fL (ref 78.0–100.0)
Platelets: 348 10*3/uL (ref 150–400)
Platelets: 375 10*3/uL (ref 150–400)
RBC: 3.87 MIL/uL (ref 3.87–5.11)
RBC: 3.93 MIL/uL (ref 3.87–5.11)
RDW: 12.7 % (ref 11.5–15.5)
RDW: 12.5 % (ref 11.5–15.5)
WBC: 8.7 10*3/uL (ref 4.0–10.5)
WBC: 9 10*3/uL (ref 4.0–10.5)

## 2017-06-07 LAB — MAGNESIUM
Magnesium: 2 mg/dL (ref 1.7–2.4)
Magnesium: 1.8 mg/dL (ref 1.7–2.4)

## 2017-06-07 LAB — CREATININE, SERUM
Creatinine, Ser: 0.75 mg/dL (ref 0.44–1.00)
GFR calc Af Amer: >60 mL/min (ref >60)
GFR calc non Af Amer: >60 mL/min (ref >60)

## 2017-06-07 LAB — LIPASE, BLOOD: Lipase: 27 U/L (ref 11–51)

## 2017-06-07 LAB — TSH: TSH: 2.127 u[IU]/mL (ref 0.350–4.500)

## 2017-06-07 LAB — PHOSPHORUS: Phosphorus: 2.2 mg/dL — ABNORMAL LOW (ref 2.5–4.6)

## 2017-06-07 LAB — TROPONIN I: Troponin I: <0.03 ng/mL (ref <0.03)

## 2017-06-07 IMAGING — DX DG ABD PORTABLE 1V
1 series · 1 of 1 positions shown · non-contrast
Comparison: CT [DATE]

CLINICAL DATA: NG tube placement

EXAM:
PORTABLE ABDOMEN - 1 VIEW

[abdomen kub]
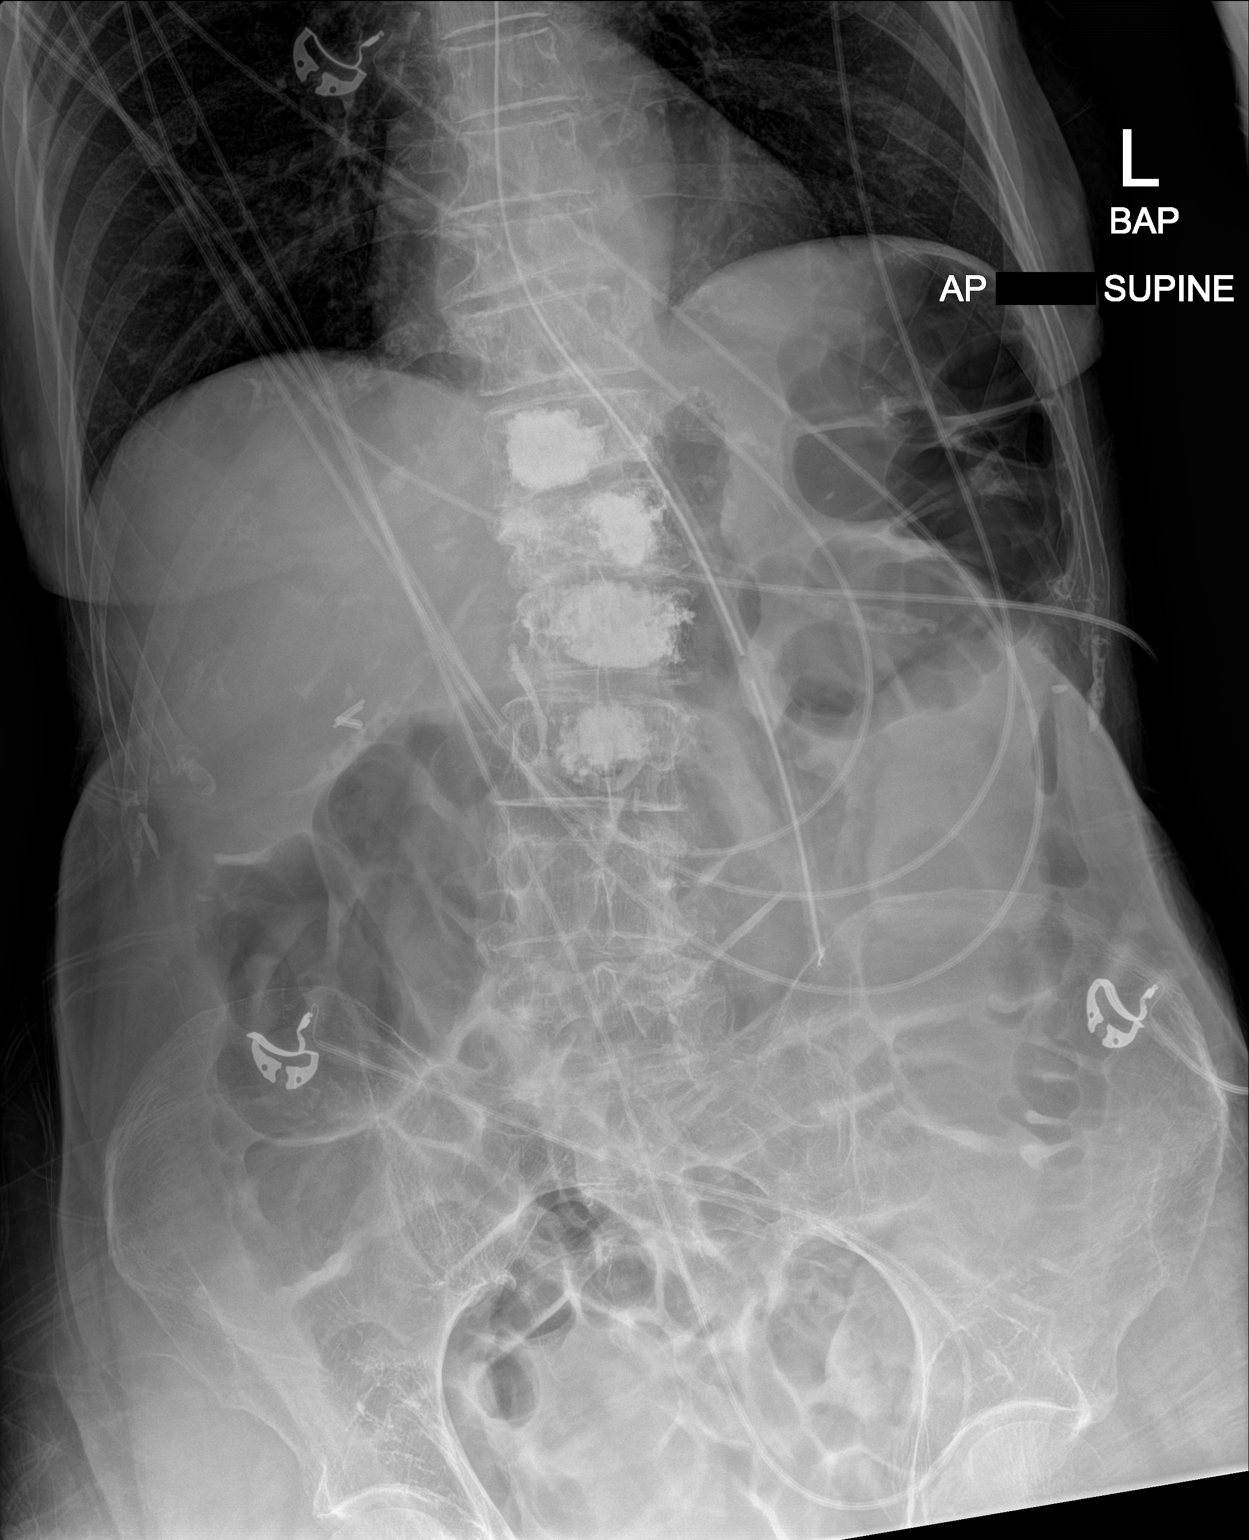

[1 of 1 positions shown; findings below may reference images not displayed]

FINDINGS: Lung bases are clear. Esophageal tube tip overlies the left abdomen
presumably over the mid stomach. Mild diffuse gaseous dilatation of
the bowel. Surgical clips in the right upper quadrant. Vertebral
augmentation changes of multiple thoracic and lumbar vertebra.
IMPRESSION: 1. Esophageal tube tip in the left abdomen over expected location of
mid stomach
2. Mild diffuse increased bowel gas could be secondary to an ileus

## 2017-06-07 MED ORDER — AMLODIPINE BESYLATE 10 MG PO TABS
10.0000 mg | ORAL_TABLET | Freq: Every day | ORAL | Status: DC
  Administered 2017-06-07 – 2017-06-09 (×3): 10 mg via ORAL
  Filled 2017-06-07 (×4): qty 1

## 2017-06-07 MED ORDER — METOCLOPRAMIDE HCL 10 MG PO TABS
10.0000 mg | ORAL_TABLET | Freq: Once | ORAL | Status: AC
  Administered 2017-06-07: 10 mg via ORAL
  Filled 2017-06-07: qty 1

## 2017-06-07 MED ORDER — ONDANSETRON HCL 4 MG/2ML IJ SOLN
4.0000 mg | Freq: Four times a day (QID) | INTRAMUSCULAR | Status: DC | PRN
  Administered 2017-06-07: 4 mg via INTRAVENOUS
  Filled 2017-06-07: qty 2

## 2017-06-07 MED ORDER — POTASSIUM CHLORIDE IN NACL 20-0.9 MEQ/L-% IV SOLN
INTRAVENOUS | Status: DC
  Administered 2017-06-07: via INTRAVENOUS
  Filled 2017-06-07: qty 1000

## 2017-06-07 MED ORDER — ZOLPIDEM TARTRATE 5 MG PO TABS
5.0000 mg | ORAL_TABLET | Freq: Every evening | ORAL | Status: DC | PRN
  Administered 2017-06-07 – 2017-06-08 (×2): 5 mg via ORAL
  Filled 2017-06-07 (×2): qty 1

## 2017-06-07 MED ORDER — PHENOL 1.4 % MT LIQD: 1.0000 | OROMUCOSAL | Status: DC | PRN

## 2017-06-07 MED ORDER — LORAZEPAM 2 MG/ML IJ SOLN: 0.5000 mg | Freq: Three times a day (TID) | INTRAMUSCULAR | Status: DC | PRN

## 2017-06-07 MED ORDER — BENAZEPRIL HCL 5 MG PO TABS
20.0000 mg | ORAL_TABLET | Freq: Every day | ORAL | Status: DC
  Administered 2017-06-07 – 2017-06-09 (×3): 20 mg via ORAL
  Filled 2017-06-07 (×4): qty 4

## 2017-06-07 MED ORDER — ONDANSETRON HCL 4 MG PO TABS: 4.0000 mg | ORAL_TABLET | Freq: Four times a day (QID) | ORAL | Status: DC | PRN

## 2017-06-07 MED ORDER — VITAMIN D 1000 UNITS PO TABS
2000.0000 [IU] | ORAL_TABLET | Freq: Every day | ORAL | Status: DC
  Administered 2017-06-09: 2000 [IU] via ORAL
  Filled 2017-06-07 (×3): qty 2

## 2017-06-07 MED ORDER — FLEET ENEMA 7-19 GM/118ML RE ENEM: 1.0000 | ENEMA | Freq: Once | RECTAL | Status: DC | PRN

## 2017-06-07 MED ORDER — ENOXAPARIN SODIUM 40 MG/0.4ML ~~LOC~~ SOLN
40.0000 mg | Freq: Every day | SUBCUTANEOUS | Status: DC
  Administered 2017-06-07: 40 mg via SUBCUTANEOUS
  Filled 2017-06-07: qty 0.4

## 2017-06-07 MED ORDER — SODIUM CHLORIDE 0.9 % IV BOLUS (SEPSIS)
500.0000 mL | Freq: Once | INTRAVENOUS | Status: AC
  Administered 2017-06-07: 500 mL via INTRAVENOUS

## 2017-06-07 NOTE — ED Notes (Signed)
Pt complaining about pain at her IV site. This nurse assessed line, IV flushed without difficulty, blood return noted, no redness, swelling, or warmth noted at site. Pt requesting IV to be placed on her non dominant hand. This nurse removed IV in R hand and replaced line in L forearm.

## 2017-06-07 NOTE — ED Notes (Signed)
Admitting at the bedside. Will insert NG tube after MD assessment then transport pt upstairs.

## 2017-06-07 NOTE — Telephone Encounter (Signed)
Please advise, pt was scheduled on you schedule?

## 2017-06-07 NOTE — ED Notes (Signed)
Attempted report x1. 

## 2017-06-07 NOTE — ED Notes (Signed)
ED Provider at bedside. 

## 2017-06-07 NOTE — ED Triage Notes (Signed)
Pt reports constipation, LBM was Sunday. She was seen here and discharged last night and told to take "a miralax bomb, gator-aide and fleets enema but it has not helped." She also reports abdominal pain.

## 2017-06-07 NOTE — Telephone Encounter (Signed)
Aware, will watch for notes

## 2017-06-07 NOTE — Telephone Encounter (Signed)
Sereniti from [facility] [facility] called to request more detailed triage of pt; I spoke with pt and she has not had BM or gas since 06/02/17;abd pain, feels like having huge gas pains, could not put pain level # on pain; abd is very distended; pt ran 1 -2 degree fever last night; pt has not seen any blood. Pt wanted to know if we could admit pt and I advised pt could not do direct admit and pt will go back to ED to be seen. Cancelled appt with Brunetta Jeans PA. FYI to Dr Glori Bickers and Crista Curb PA.

## 2017-06-07 NOTE — Telephone Encounter (Signed)
[facility] Patient Name: Wendy [name] DOB: 1946-03-10 Initial Comment Caller was in the hospital Wednesday because she hasn't had a BM or gas since Sunday. Stomach is distended, and painful, has tried everything they suggested but it is not working. States pain is moderate - severe. Nurse Assessment Nurse: Dimas Chyle, RN, Dellis Filbert Date/Time Eilene Ghazi Time): 06/07/2017 11:17:43 AM Confirm and document reason for call. If symptomatic, describe symptoms. ---Caller was in the hospital Wednesday because she hasn't had a BM or gas since Sunday. Stomach is distended, and painful, has tried everything they suggested but it is not working. States pain is moderate - severe. Has been using Miralax and stool softeners. Does the patient have any new or worsening symptoms? ---Yes Will a triage be completed? ---Yes Related visit to physician within the last 2 weeks? ---Yes Does the PT have any chronic conditions? (i.e. diabetes, asthma, etc.) ---Yes List chronic conditions. ---HTN Is this a behavioral health or substance abuse call? ---No Guidelines Guideline Title Affirmed Question Affirmed Notes Constipation [1] Constant abdominal pain AND [2] present > 2 hours Final Disposition User See Physician within 4 Hours (or PCP triage) Dimas Chyle, RN, Dellis Filbert Referrals REFERRED TO PCP OFFICE Disagree/Comply: Leta Baptist
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
## 2017-06-07 NOTE — Telephone Encounter (Signed)
Pt was sent to the ER by nurse at [facility]. Pt is returning to the hospital.
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
## 2017-06-07 NOTE — Telephone Encounter (Addendum)
Patient needs ER disposition if she was seen in ER for this issue and still unable to pass gas or a bowel movement.  Needs triage from RN at PCP office if refusing to go to ER.  Not appropriate for ER follow-up to be on my schedule if PCP has availability.

## 2017-06-07 NOTE — ED Notes (Addendum)
Main lab called by this nurse, per main lab will add on magnesium level.

## 2017-06-08 ENCOUNTER — Inpatient Hospital Stay (HOSPITAL_COMMUNITY): Payer: Medicare Other

## 2017-06-08 DIAGNOSIS — M19041 Primary osteoarthritis, right hand: Secondary | ICD-10-CM

## 2017-06-08 DIAGNOSIS — E871 Hypo-osmolality and hyponatremia: Secondary | ICD-10-CM

## 2017-06-08 DIAGNOSIS — M19042 Primary osteoarthritis, left hand: Secondary | ICD-10-CM

## 2017-06-08 DIAGNOSIS — K567 Ileus, unspecified: Principal | ICD-10-CM

## 2017-06-08 DIAGNOSIS — I10 Essential (primary) hypertension: Secondary | ICD-10-CM

## 2017-06-08 LAB — CBC
HCT: 32.2 % — ABNORMAL LOW (ref 36.0–46.0)
Hemoglobin: 11.5 g/dL — ABNORMAL LOW (ref 12.0–15.0)
MCH: 32.2 pg (ref 26.0–34.0)
MCHC: 35.7 g/dL (ref 30.0–36.0)
MCV: 90.2 fL (ref 78.0–100.0)
Platelets: 321 10*3/uL (ref 150–400)
RBC: 3.57 MIL/uL — ABNORMAL LOW (ref 3.87–5.11)
RDW: 12.5 % (ref 11.5–15.5)
WBC: 7.8 10*3/uL (ref 4.0–10.5)

## 2017-06-08 LAB — COMPREHENSIVE METABOLIC PANEL
ALT: 12 U/L — ABNORMAL LOW (ref 14–54)
AST: 19 U/L (ref 15–41)
Albumin: 3.5 g/dL (ref 3.5–5.0)
Alkaline Phosphatase: 52 U/L (ref 38–126)
Anion gap: 11 (ref 5–15)
BUN: 6 mg/dL (ref 6–20)
CO2: 24 mmol/L (ref 22–32)
Calcium: 8.4 mg/dL — ABNORMAL LOW (ref 8.9–10.3)
Chloride: 96 mmol/L — ABNORMAL LOW (ref 101–111)
Creatinine, Ser: 0.74 mg/dL (ref 0.44–1.00)
GFR calc Af Amer: >60 mL/min (ref >60)
GFR calc non Af Amer: >60 mL/min (ref >60)
Glucose, Bld: 89 mg/dL (ref 65–99)
Potassium: 3.2 mmol/L — ABNORMAL LOW (ref 3.5–5.1)
Sodium: 131 mmol/L — ABNORMAL LOW (ref 135–145)
Total Bilirubin: 1.3 mg/dL — ABNORMAL HIGH (ref 0.3–1.2)
Total Protein: 6.1 g/dL — ABNORMAL LOW (ref 6.5–8.1)

## 2017-06-08 LAB — TROPONIN I
Troponin I: <0.03 ng/mL (ref <0.03)
Troponin I: <0.03 ng/mL (ref <0.03)

## 2017-06-08 LAB — PROTIME-INR
INR: 1.12
Prothrombin Time: 14.5 seconds (ref 11.4–15.2)

## 2017-06-08 LAB — APTT: aPTT: 50 seconds — ABNORMAL HIGH (ref 24–36)

## 2017-06-08 LAB — MAGNESIUM: Magnesium: 1.9 mg/dL (ref 1.7–2.4)

## 2017-06-08 LAB — GLUCOSE, CAPILLARY: Glucose-Capillary: 76 mg/dL (ref 65–99)

## 2017-06-08 IMAGING — DX DG ABDOMEN 2V
2 series · 2 of 2 positions shown · non-contrast
Comparison: [DATE]

CLINICAL DATA: Ileus.

EXAM:
ABDOMEN - 2 VIEW

[abdomen erect]
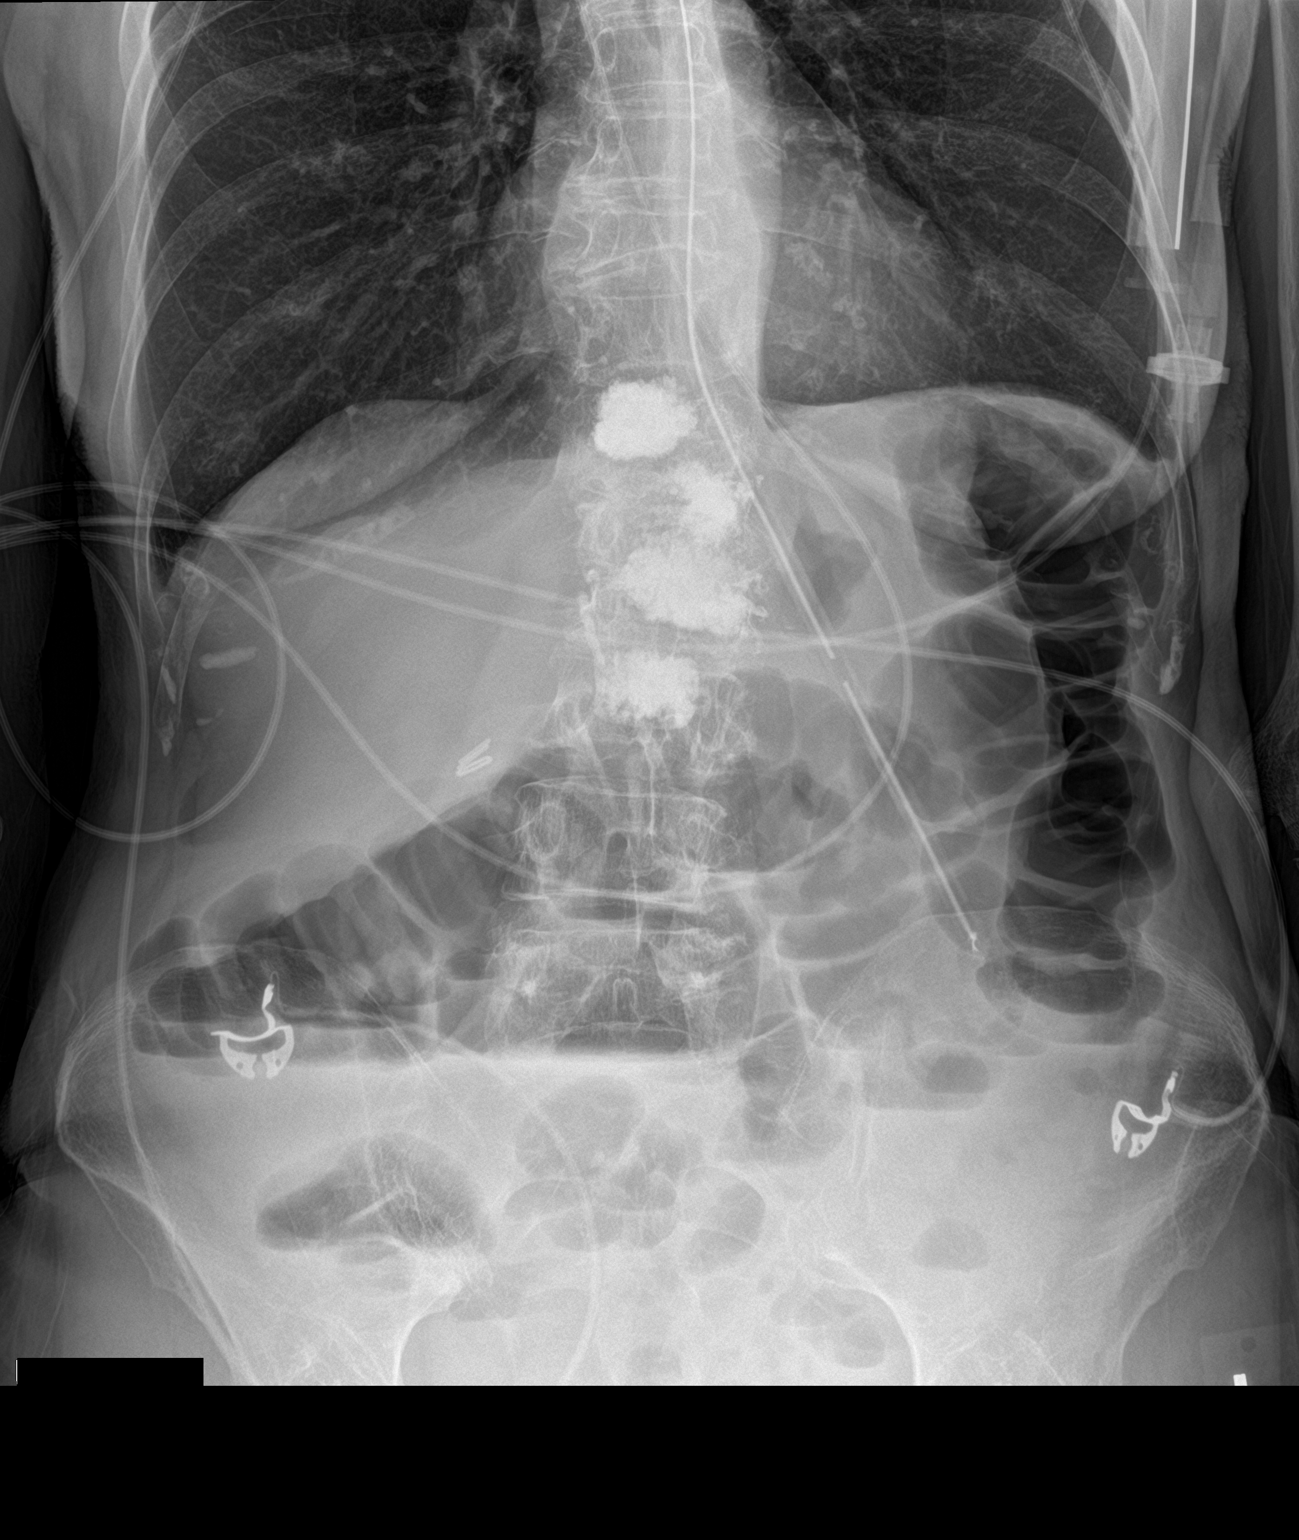

[abdomen supine]
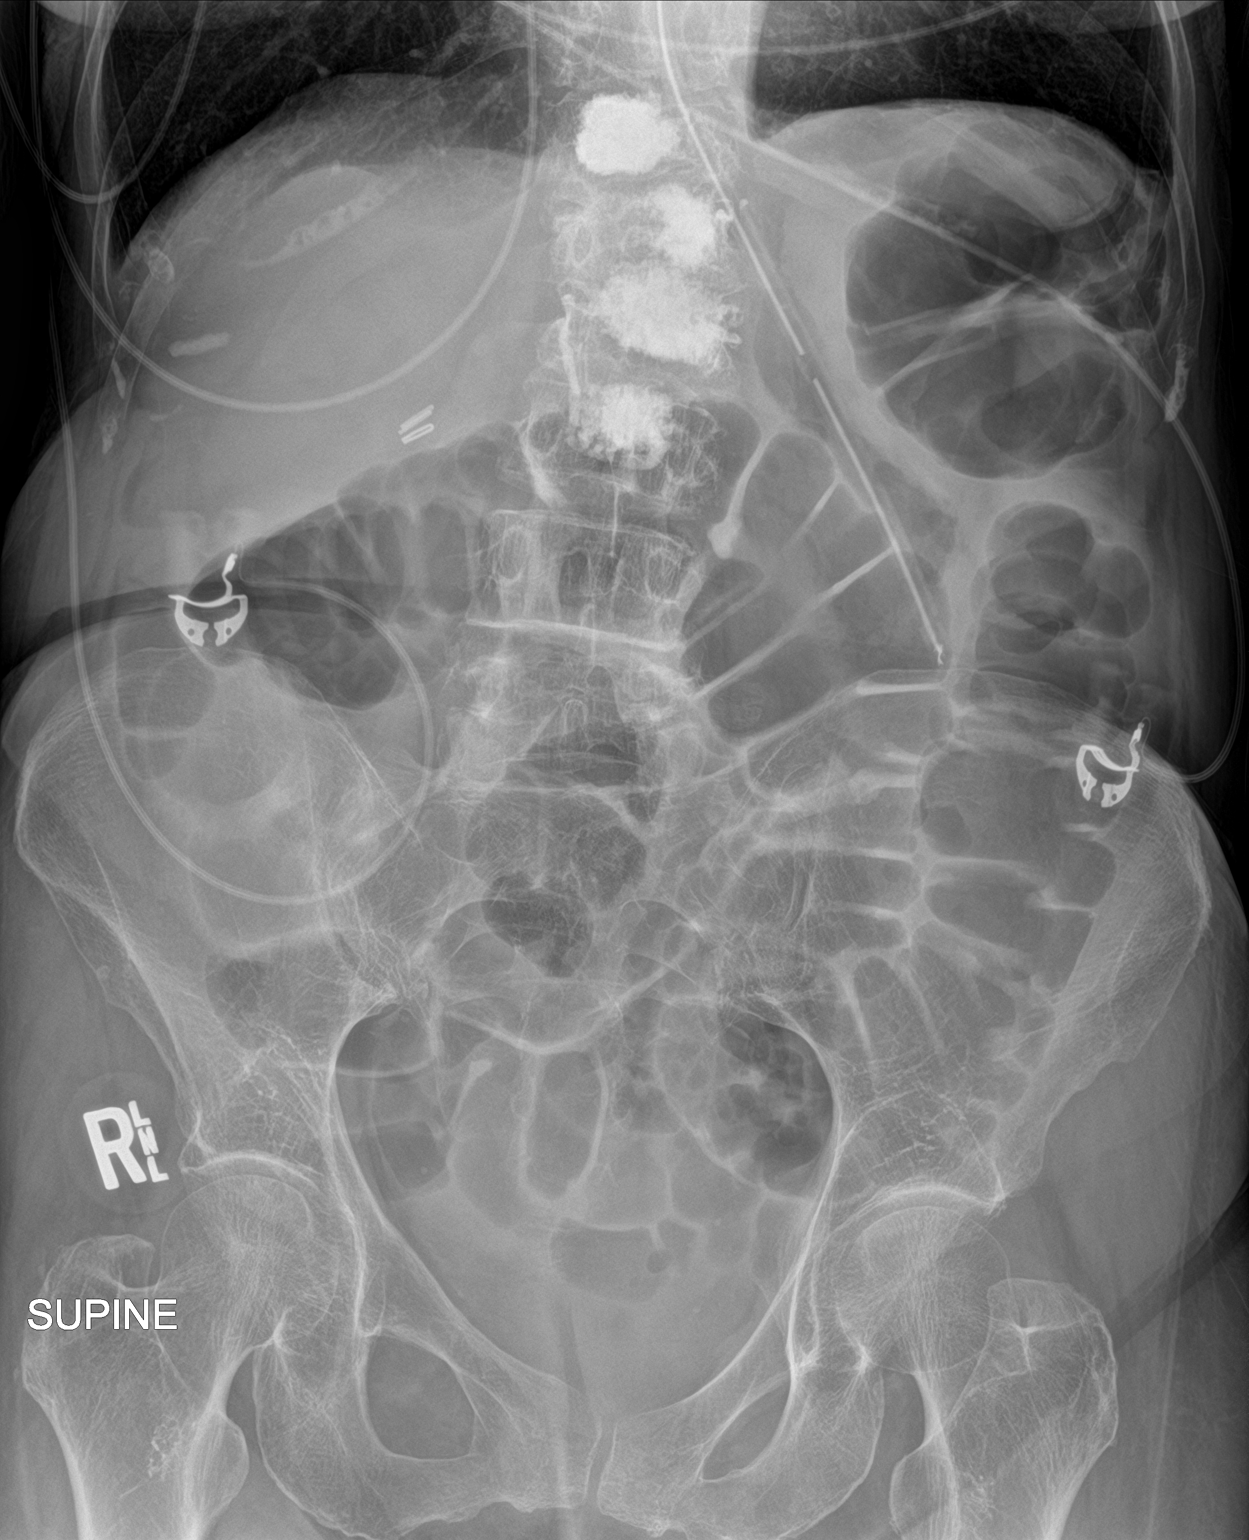

[2 of 2 positions shown; findings below may reference images not displayed]

FINDINGS: Supine and erect views of the abdomen demonstrate mild gaseous
distension of large and small bowel loops. Stomach is decompressed
by a nasogastric tube. No evidence for bowel obstruction. Multilevel
vertebroplasty. Bones appear radiolucent.
IMPRESSION: Stable appearance of ileus.

## 2017-06-08 MED ORDER — POTASSIUM PHOSPHATES 15 MMOLE/5ML IV SOLN
30.0000 mmol | Freq: Once | INTRAVENOUS | Status: AC
  Administered 2017-06-08: 30 mmol via INTRAVENOUS
  Filled 2017-06-08 (×2): qty 10

## 2017-06-08 MED ORDER — POLYETHYLENE GLYCOL 3350 17 G PO PACK: 17.0000 g | PACK | Freq: Every day | ORAL | Status: DC

## 2017-06-08 MED ORDER — POTASSIUM CHLORIDE 10 MEQ/100ML IV SOLN
10.0000 meq | INTRAVENOUS | Status: AC
  Administered 2017-06-08 (×3): 10 meq via INTRAVENOUS
  Filled 2017-06-08 (×3): qty 100

## 2017-06-08 MED ORDER — SODIUM CHLORIDE 0.9 % IV SOLN
INTRAVENOUS | Status: DC
  Administered 2017-06-08 – 2017-06-09 (×2): via INTRAVENOUS
  Filled 2017-06-08 (×4): qty 1000

## 2017-06-08 MED ORDER — MAGNESIUM SULFATE 2 GM/50ML IV SOLN
2.0000 g | Freq: Once | INTRAVENOUS | Status: AC
  Administered 2017-06-08: 2 g via INTRAVENOUS
  Filled 2017-06-08 (×2): qty 50

## 2017-06-08 MED ORDER — POTASSIUM CHLORIDE CRYS ER 20 MEQ PO TBCR
40.0000 meq | EXTENDED_RELEASE_TABLET | ORAL | Status: DC
Start: 2017-06-08 — End: 2017-06-08

## 2017-06-08 MED ORDER — POLYETHYLENE GLYCOL 3350 17 G PO PACK
17.0000 g | PACK | Freq: Every day | ORAL | Status: DC
  Administered 2017-06-09: 17 g via ORAL
  Filled 2017-06-08: qty 1

## 2017-06-08 MED ORDER — PANTOPRAZOLE SODIUM 40 MG IV SOLR
40.0000 mg | INTRAVENOUS | Status: DC
  Administered 2017-06-08: 40 mg via INTRAVENOUS

## 2017-06-08 MED ORDER — SENNOSIDES-DOCUSATE SODIUM 8.6-50 MG PO TABS
1.0000 | ORAL_TABLET | Freq: Two times a day (BID) | ORAL | Status: DC
  Administered 2017-06-08 – 2017-06-09 (×2): 1 via ORAL
  Filled 2017-06-08 (×2): qty 1

## 2017-06-08 NOTE — Progress Notes (Signed)
PROGRESS NOTE    Wendy [name]  CBJ:[id number] DOB: [birth year] DOA: 06/07/2017 PCP: Abner Greenspan, MD    Brief Narrative:  Patient is a 71 year old female history of chronic pain on chronic tramadol treatment present a five-day history of constipation followed by nausea vomiting of 3 days' duration with increasing abdominal girth. Patient took multiple laxatives without relief with mid abdominal discomfort. Patient was seen as on in the ED given IV fluids anti-emetics imaging confirmed ileus which is mild. Patient improved in the ED and will discharge home. Patient presented back with worsening symptoms and was admitted. NG tube inserted.   Assessment & Plan:   Principal Problem:   Ileus ([place]) Active Problems:   Essential hypertension   Hyponatremia   Osteoarthritis  #1 ileus Questionable etiology. Likely secondary to chronic pain medications. Patient states no further nausea or vomiting. NG tube intact. Patient states passing gas and had small bowel movement. Replete electrolytes keep potassium greater than 4. Keep magnesium greater than 2. Mobilize. Give enema. If no further nausea or vomiting in the next 3-4 hours will discontinue NG tube in place on clear liquids. Once tolerating oral intake will need to place on bowel regimen.  #2 hyponatremia Likely secondary to hypovolemic hyponatremia. Improved with IV fluids. Follow.  #3 hypertension Blood pressure stable. Continue Norvasc and benazepril.  #4 hypokalemia Replete.  #5 chronic pain secondary to osteoarthritis Stable. Continue vitamin D.   DVT prophylaxis: Lovenox. Code Status: Full Family Communication: Updated patient. No family at bedside. Disposition Plan: home once ileus has resolved patient went bowel movement passing gas and tolerating oral intake.   Consultants:   None  Procedures:  Abdominal x-ray 06/08/2007  CT abdomen and pelvis 06/06/2017  Antimicrobials:   None   Subjective: Patient states  passing gas and had small bowel movement. Feeling better. No nausea or emesis. Patient wants NG tube out.  Objective: Vitals:   06/07/17 2124 06/07/17 2339 06/08/17 0333 06/08/17 1304  BP: (!) 153/68 (!) 150/69 (!) 124/56 134/62  Pulse: 93  (!) 102 (!) 104  Resp: 14  16 16   Temp: 98.4 F (36.9 C)  99.2 F (37.3 C) 98.3 F (36.8 C)  TempSrc: Oral  Oral Oral  SpO2: 98%  96% 99%  Weight: 50.4 kg (111 lb 1.6 oz)     Height: 5\' 5"  (1.651 m)       Intake/Output Summary (Last 24 hours) at 06/08/17 1557 Last data filed at 06/08/17 0900  Gross per 24 hour  Intake              501 ml  Output                0 ml  Net              501 ml   Filed Weights   06/07/17 1354 06/07/17 2124  Weight: 52.2 kg (115 lb) 50.4 kg (111 lb 1.6 oz)    Examination:  General exam: Appears calm and comfortable. NGT in place.  Respiratory system: Clear to auscultation. Respiratory effort normal. Cardiovascular system: S1 & S2 heard, RRR. No JVD, murmurs, rubs, gallops or clicks. No pedal edema. Gastrointestinal system: Abdomen is nondistended, soft and nontender. No organomegaly or masses felt. Normal bowel sounds heard. Central nervous system: Alert and oriented. No focal neurological deficits. Extremities: Symmetric 5 x 5 power. Skin: No rashes, lesions or ulcers Psychiatry: Judgement and insight appear normal. Mood & affect appropriate.     Data Reviewed: I  have personally reviewed following labs and imaging studies  CBC:  Recent Labs Lab 06/05/17 1947 06/07/17 1358 06/07/17 2149 06/08/17 0325  WBC 12.6* 8.7 9.0 7.8  HGB 12.7 12.0 12.1 11.5*  HCT 36.5 34.9* 35.4* 32.2*  MCV 89.9 90.2 90.1 90.2  PLT 364 348 375 161   Basic Metabolic Panel:  Recent Labs Lab 06/05/17 1947 06/07/17 1358 06/07/17 2149 06/08/17 0325 06/08/17 0855  NA 127* 126*  --  131*  --   K 3.7 3.5  --  3.2*  --   CL 91* 91*  --  96*  --   CO2 23 25  --  24  --   GLUCOSE 131* 118*  --  89  --   BUN 13 10  --   6  --   CREATININE 0.87 0.94 0.75 0.74  --   CALCIUM 9.3 9.0  --  8.4*  --   MG  --  2.0 1.8  --  1.9  PHOS  --   --  2.2*  --   --    GFR: Estimated Creatinine Clearance: 50.6 mL/min (by C-G formula based on SCr of 0.74 mg/dL). Liver Function Tests:  Recent Labs Lab 06/05/17 1947 06/07/17 1358 06/08/17 0325  AST 22 25 19   ALT 14 12* 12*  ALKPHOS 64 59 52  BILITOT 1.4* 1.3* 1.3*  PROT 7.5 7.0 6.1*  ALBUMIN 4.4 4.3 3.5    Recent Labs Lab 06/05/17 1947 06/07/17 1358  LIPASE 29 27   No results for input(s): AMMONIA in the last 168 hours. Coagulation Profile:  Recent Labs Lab 06/08/17 0325  INR 1.12   Cardiac Enzymes:  Recent Labs Lab 06/07/17 2149 06/08/17 0325 06/08/17 0855  TROPONINI <0.03 <0.03 <0.03   BNP (last 3 results) No results for input(s): PROBNP in the last 8760 hours. HbA1C: No results for input(s): HGBA1C in the last 72 hours. CBG:  Recent Labs Lab 06/08/17 0700  GLUCAP 76   Lipid Profile: No results for input(s): CHOL, HDL, LDLCALC, TRIG, CHOLHDL, LDLDIRECT in the last 72 hours. Thyroid Function Tests:  Recent Labs  06/07/17 2149  TSH 2.127   Anemia Panel: No results for input(s): VITAMINB12, FOLATE, FERRITIN, TIBC, IRON, RETICCTPCT in the last 72 hours. Sepsis Labs: No results for input(s): PROCALCITON, LATICACIDVEN in the last 168 hours.  No results found for this or any previous visit (from the past 240 hour(s)).       Radiology Studies: Dg Abd 2 Views  Result Date: 06/08/2017 CLINICAL DATA:  Ileus. EXAM: ABDOMEN - 2 VIEW COMPARISON:  06/07/2017 FINDINGS: Supine and erect views of the abdomen demonstrate mild gaseous distension of large and small bowel loops. Stomach is decompressed by a nasogastric tube. No evidence for bowel obstruction. Multilevel vertebroplasty. Bones appear radiolucent. IMPRESSION: Stable appearance of ileus. Electronically Signed   By: Nolon Nations M.D.   On: 06/08/2017 11:16   Dg Abd Portable  1v  Result Date: 06/08/2017 CLINICAL DATA:  NG tube placement EXAM: PORTABLE ABDOMEN - 1 VIEW COMPARISON:  CT 06/06/2017 FINDINGS: Lung bases are clear. Esophageal tube tip overlies the left abdomen presumably over the mid stomach. Mild diffuse gaseous dilatation of the bowel. Surgical clips in the right upper quadrant. Vertebral augmentation changes of multiple thoracic and lumbar vertebra. IMPRESSION: 1. Esophageal tube tip in the left abdomen over expected location of mid stomach 2. Mild diffuse increased bowel gas could be secondary to an ileus Electronically Signed   By: Madie Reno.D.  On: 06/08/2017 00:03        Scheduled Meds: . amLODipine  10 mg Oral Daily  . benazepril  20 mg Oral Daily  . cholecalciferol  2,000 Units Oral Daily  . enoxaparin (LOVENOX) injection  40 mg Subcutaneous QHS  . pantoprazole (PROTONIX) IV  40 mg Intravenous Q24H   Continuous Infusions: . potassium phosphate IVPB (mmol)    . sodium chloride 0.9 % 1,000 mL with potassium chloride 40 mEq infusion 125 mL/hr at 06/08/17 1034     LOS: 1 day    Time spent: 16 mins    Erland Vivas, MD [facility] Pager [phone]  If 7PM-7AM, please contact night-coverage [email or web address] Password [facility] 06/08/2017, 3:57 PM

## 2017-06-08 NOTE — Progress Notes (Signed)
Initial Nutrition Assessment  DOCUMENTATION CODES:  Underweight  INTERVENTION:  Vegetarian diet (ovo-lacto) once diet is advanced.   NUTRITION DIAGNOSIS:  Inadequate oral intake related to inability to eat, nausea, altered GI function (Ileus) as evidenced by per pt report of no intake x4 days  GOAL:  Patient will meet greater than or equal to 90% of their needs  MONITOR:  PO intake, Diet advancement  REASON FOR ASSESSMENT:  Malnutrition Screening Tool    ASSESSMENT:  71 y/o female PMHx HTN, GERD, Chronic pain. Presented with constipation since Monday. Had associated nausea and vomiting. Had been seen in ED 6/14 at which time imaging confirmed ileus, but was mild and was discharged after she passed gas. Returned and admitted due to recurrence of symptoms.   Pt reports that her last good meal was Tuesday afternoon. She had a little Watermelon Wednesday morning, but nothing else since then. She says her nausea is only mild. Her main problem she is "stuffed up". At home she followed a vegetarian (ovo-lacto) diet and has for the past 20 years. She did not take any oral nutritional supplements.   She says her UBW is 115 lbs and says her 4 lbs wt loss was due to this acute episode. Per chart, she has consistently weighed 112-115 lbs for the past 4 years.   Physical Exam. Would initially appear to have some fat/muscle wasting, but says she looks how she always does. Has a BMI that is underweight, but this is how she is chronically.   At this time, the patient says she has no more nausea. She says the reglan successfully treated her symptoms. Per documentation, she had a BM yesterday. She had sips of water and ice and tolerated. She wants tube out of her nose. RD stated he will place her on Veg diet once she is advanced. No further interventions warranted.   Meds: IVF nacl w/ kcl, PPI, Vit D Labs: Na: 131, K: 3.2, Total Pro 6.1,   Recent Labs Lab 06/05/17 1947 06/07/17 1358  06/07/17 2149 06/08/17 0325 06/08/17 0855  NA 127* 126*  --  131*  --   K 3.7 3.5  --  3.2*  --   CL 91* 91*  --  96*  --   CO2 23 25  --  24  --   BUN 13 10  --  6  --   CREATININE 0.87 0.94 0.75 0.74  --   CALCIUM 9.3 9.0  --  8.4*  --   MG  --  2.0 1.8  --  1.9  PHOS  --   --  2.2*  --   --   GLUCOSE 131* 118*  --  89  --    Diet Order:  Diet NPO time specified Except for: Sips with Meds, Ice Chips  Skin:  Reviewed, no issues  Last BM:  6/15  Height:  Ht Readings from Last 1 Encounters:  06/07/17 5\' 5"  (1.651 m)   Weight:  Wt Readings from Last 1 Encounters:  06/07/17 111 lb 1.6 oz (50.4 kg)    Wt Readings from Last 10 Encounters:  06/07/17 111 lb 1.6 oz (50.4 kg)  06/05/17 115 lb (52.2 kg)  09/24/16 114 lb 8 oz (51.9 kg)  09/18/16 113 lb 4 oz (51.4 kg)  05/08/16 108 lb 4 oz (49.1 kg)  09/14/15 115 lb 1.9 oz (52.2 kg)  03/16/15 115 lb (52.2 kg)  03/02/15 115 lb 9.6 oz (52.4 kg)  07/20/14 114 lb 8  oz (51.9 kg)  06/08/13 112 lb 4 oz (50.9 kg)   Ideal Body Weight:  56.82 kg  BMI:  Body mass index is 18.49 kg/m.  Estimated Nutritional Needs:  Kcal:  1500-1700 (30-34 kcal/kg bw) Protein:  55-65 (1.1-1.3 g/kg bw) Fluid:  > 1.5 L (30 ml/kg bw)  EDUCATION NEEDS:  No education needs identified at this time  Burtis Junes RD, LDN, [facility] Pager: [phone] 06/08/2017 12:01 PM

## 2017-06-09 LAB — CBC WITH DIFFERENTIAL/PLATELET
Basophils Absolute: 0 10*3/uL (ref 0.0–0.1)
Basophils Relative: 0 %
Eosinophils Absolute: 0.1 10*3/uL (ref 0.0–0.7)
Eosinophils Relative: 1 %
HCT: 35.8 % — ABNORMAL LOW (ref 36.0–46.0)
Hemoglobin: 12.3 g/dL (ref 12.0–15.0)
Lymphocytes Relative: 21 %
Lymphs Abs: 1.7 10*3/uL (ref 0.7–4.0)
MCH: 30.9 pg (ref 26.0–34.0)
MCHC: 34.4 g/dL (ref 30.0–36.0)
MCV: 89.9 fL (ref 78.0–100.0)
Monocytes Absolute: 1.1 10*3/uL — ABNORMAL HIGH (ref 0.1–1.0)
Monocytes Relative: 14 %
Neutro Abs: 5 10*3/uL (ref 1.7–7.7)
Neutrophils Relative %: 64 %
Platelets: 397 10*3/uL (ref 150–400)
RBC: 3.98 MIL/uL (ref 3.87–5.11)
RDW: 12.6 % (ref 11.5–15.5)
WBC: 7.9 10*3/uL (ref 4.0–10.5)

## 2017-06-09 LAB — BASIC METABOLIC PANEL
Anion gap: 10 (ref 5–15)
BUN: <5 mg/dL — ABNORMAL LOW (ref 6–20)
CO2: 23 mmol/L (ref 22–32)
Calcium: 9 mg/dL (ref 8.9–10.3)
Chloride: 95 mmol/L — ABNORMAL LOW (ref 101–111)
Creatinine, Ser: 0.6 mg/dL (ref 0.44–1.00)
GFR calc Af Amer: >60 mL/min (ref >60)
GFR calc non Af Amer: >60 mL/min (ref >60)
Glucose, Bld: 106 mg/dL — ABNORMAL HIGH (ref 65–99)
Potassium: 4.5 mmol/L (ref 3.5–5.1)
Sodium: 128 mmol/L — ABNORMAL LOW (ref 135–145)

## 2017-06-09 LAB — GLUCOSE, CAPILLARY: Glucose-Capillary: 99 mg/dL (ref 65–99)

## 2017-06-09 LAB — PHOSPHORUS: Phosphorus: 4.4 mg/dL (ref 2.5–4.6)

## 2017-06-09 LAB — MAGNESIUM: Magnesium: 2.3 mg/dL (ref 1.7–2.4)

## 2017-06-09 MED ORDER — PANTOPRAZOLE SODIUM 40 MG PO TBEC: 40.0000 mg | DELAYED_RELEASE_TABLET | Freq: Every day | ORAL | Status: DC

## 2017-06-09 MED ORDER — POLYETHYLENE GLYCOL 3350 17 G PO PACK: 17.0000 g | PACK | Freq: Every day | ORAL | 0 refills | Status: DC

## 2017-06-09 MED ORDER — PANTOPRAZOLE SODIUM 40 MG PO TBEC: 40.0000 mg | DELAYED_RELEASE_TABLET | Freq: Every day | ORAL | 0 refills | Status: DC

## 2017-06-09 MED ORDER — SENNOSIDES-DOCUSATE SODIUM 8.6-50 MG PO TABS: 1.0000 | ORAL_TABLET | Freq: Two times a day (BID) | ORAL | Status: DC

## 2017-06-09 NOTE — Discharge Summary (Signed)
Physician Discharge Summary  Wendy [name] ERX:[id number] DOB: [birth year] DOA: 06/07/2017  PCP: Abner Greenspan, MD  Admit date: 06/07/2017 Discharge date: 06/09/2017  Time spent: 65 minutes  Recommendations for Outpatient Follow-up:  1. Follow-up with Tower, Wynelle Fanny, MD On follow-up patient in need a basic metabolic profile done to follow-up on electrolytes and renal function. CBC need to be obtained. Patient's bowel regimen will need to be reassessed.   Discharge Diagnoses:  Principal Problem:   Ileus [facility]) Active Problems:   Essential hypertension   Hyponatremia   Osteoarthritis   Discharge Condition: Stable and improved  Diet recommendation: Regular  Filed Weights   06/07/17 1354 06/07/17 2124  Weight: 52.2 kg (115 lb) 50.4 kg (111 lb 1.6 oz)    History of present illness:  Per Dr Suan Halter is a 71 y.o. female with medical history significant for but not limited to chronic pain on chronic tramadol treatment presented with 5 day history of constipation followed by nausea and vomiting of 3 days' duration with increasing abdominal girth. She had taken multiple laxatives without relief, with mild abdominal discomfort.  She was seen in the emergency room  For one day prior to admission, for the same symptoms and treated supportively supportive with IV fluids and and antinauseants after imaging studies had confirmed ileus which was said to be mild. She was significantly improved at emergency room and actually passed gas for the first time in 5 days and was discharged after being able to tolerate by po's.  She had returned to ED, for reevaluation on account of failed outpatiet treatment with recurrence of her nausea and vomiting.   ED Course: At emergency room patient was hemodynamically stable with hyponatremia on blood work (chronic). She was treated with IV Reglan and IV fluids without improvement and is admitted for further management.   Hospital Course:  #1  ileus Questionable etiology. Likely secondary to chronic pain medications. Patient stated no further nausea or vomiting. NG tube which was placed in the emergency room was subsequently discontinued as patient abdominal pain improved with no further nausea or emesis.  Patient stated she was passing gas and had small bowel movement. Repleted electrolytes to keep potassium greater than 4. Keep magnesium greater than 2. Patient was given a enema and monitored. She denied no further nausea vomiting. Patient had multiple small stools. NG tube was subsequently discontinued. Patient was placed on a diet which she tolerated. Outpatient follow-up.   #2 chronic hyponatremia Patient placed on IV fluids. Remained stable.   #3 hypertension Blood pressure stable. Continued on home regimen of Norvasc and benazepril.  #4 hypokalemia Repleted.  #5 chronic pain secondary to osteoarthritis Stable. Continued on home regimen of vitamin D.  Procedures:  Abdominal x-ray 06/08/2007  CT abdomen and pelvis 06/06/2017  Consultations:  None  Discharge Exam: Vitals:   06/08/17 2203 06/09/17 0557  BP: (!) 141/70 121/69  Pulse: 97 88  Resp: 18 18  Temp: 98.1 F (36.7 C) 98 F (36.7 C)    General: NAD Cardiovascular: RRR Respiratory: CTAB PJ:KDTO, nontender, nondistended, positive bowel sounds.  Discharge Instructions   Discharge Instructions    Diet general    Complete by:  As directed    Increase activity slowly    Complete by:  As directed      Current Discharge Medication List    START taking these medications   Details  pantoprazole (PROTONIX) 40 MG tablet Take 1 tablet (40 mg total) by mouth  daily. Qty: 30 tablet, Refills: 0    polyethylene glycol (MIRALAX / GLYCOLAX) packet Take 17 g by mouth daily. Qty: 14 each, Refills: 0    senna-docusate (SENOKOT-S) 8.6-50 MG tablet Take 1 tablet by mouth 2 (two) times daily.      CONTINUE these medications which have NOT CHANGED    Details  alclomethasone (ACLOVATE) 0.05 % ointment APPLY TO LIPS ONCE DAILY AS NEEDED FOR SEVERE CHAPPING--DO NOT USE LONGER THAN 1 WEEK Qty: 30 g, Refills: 2    amLODipine (NORVASC) 10 MG tablet Take 1 tablet (10 mg total) by mouth daily. Qty: 90 tablet, Refills: 3    benazepril (LOTENSIN) 20 MG tablet Take 1 tablet (20 mg total) by mouth daily. Qty: 90 tablet, Refills: 3    calcium carbonate (OS-CAL) 600 MG TABS Take 1,200 mg by mouth daily.     Cholecalciferol (VITAMIN D) 2000 UNITS tablet Take 2,000 Units by mouth daily.      fluticasone (FLONASE) 50 MCG/ACT nasal spray PLACE 2 SPRAYS IN EACH NOSTRIL DAILY Qty: 16 g, Refills: 11    hydrocortisone 2.5 % cream Apply topically 2 (two) times daily as needed. For itching Qty: 30 g, Refills: 1    ibandronate (BONIVA) 150 MG tablet TAKE 1 TABLET BY MOUTH EVERY MONTH Qty: 1 tablet, Refills: 11    mupirocin ointment (BACTROBAN) 2 % Apply topically 3 (three) times daily. Qty: 22 g, Refills: 0    ondansetron (ZOFRAN ODT) 4 MG disintegrating tablet Take 1 tablet (4 mg total) by mouth every 8 (eight) hours as needed for nausea or vomiting. Qty: 20 tablet, Refills: 0    ranitidine (ZANTAC) 150 MG tablet Take 150 mg by mouth daily as needed. For indigestion    traMADol (ULTRAM) 50 MG tablet TAKE TWO TABLETS BY MOUTH EVERY 12 HOURS Qty: 120 tablet, Refills: 3    zolpidem (AMBIEN) 10 MG tablet TAKE 1 TABLET BY MOUTH AT BEDTIME AS NEEDED FOR SLEEP Qty: 30 tablet, Refills: 5       Allergies  Allergen Reactions  . Butalbital-Aspirin-Caffeine Other (See Comments)    hallucinations  . Morphine And Related Other (See Comments)    Does not work  . Motrin [Ibuprofen]     GI upset  . Penicillins Other (See Comments)    As child; reaction unknown Has patient had a PCN reaction causing immediate rash, facial/tongue/throat swelling, SOB or lightheadedness with hypotension: No Has patient had a PCN reaction causing severe rash involving mucus  membranes or skin necrosis: No Has patient had a PCN reaction that required hospitalization: No Has patient had a PCN reaction occurring within the last 10 years: No If all of the above answers are "NO", then may proceed with Cephalosporin use.  . Sulfa Antibiotics     In childhood  . Zanaflex [Tizanidine Hcl] Other (See Comments)    Decreased BP  . Diphenhydramine Hcl Palpitations    restlessness   Follow-up Information    Tower, Wynelle Fanny, MD. Schedule an appointment as soon as possible for a visit in 2 week(s).   Specialties:  Family Medicine, Radiology Contact information: [address] [place] [address] [phone]            The results of significant diagnostics from this hospitalization (including imaging, microbiology, ancillary and laboratory) are listed below for reference.    Significant Diagnostic Studies: Ct Abdomen Pelvis W Contrast  Result Date: 06/06/2017 CLINICAL DATA:  Acute onset of generalized abdominal pain. Initial encounter. EXAM:  CT ABDOMEN AND PELVIS WITH CONTRAST TECHNIQUE: Multidetector CT imaging of the abdomen and pelvis was performed using the standard protocol following bolus administration of intravenous contrast. CONTRAST:  130mL ISOVUE-300 IOPAMIDOL (ISOVUE-300) INJECTION 61% COMPARISON:  None. FINDINGS: Lower chest: The visualized lung bases are grossly clear. The visualized portions of the mediastinum are unremarkable. Hepatobiliary: Diffuse intrahepatic biliary ductal dilatation remains within normal limits status post cholecystectomy. The liver is unremarkable in appearance, aside from mild fatty infiltration about the falciform ligament. Pancreas: The pancreas is within normal limits. Spleen: The spleen is unremarkable in appearance. Adrenals/Urinary Tract: The adrenal glands are unremarkable in appearance. Small left renal cysts are noted. The kidneys are otherwise unremarkable. There is no evidence of hydronephrosis. No renal or  ureteral stones are identified. No perinephric stranding is seen. Stomach/Bowel: The stomach is unremarkable in appearance. The small bowel is within normal limits. The appendix is normal in caliber, without evidence of appendicitis. There is mild diffuse distention of the colon, concerning for mild ileus. Mild diverticulosis is noted at the sigmoid colon, without evidence of diverticulitis. Vascular/Lymphatic: Scattered calcification is seen along the abdominal aorta and its branches. The abdominal aorta is otherwise grossly unremarkable. The inferior vena cava is grossly unremarkable. No retroperitoneal lymphadenopathy is seen. No pelvic sidewall lymphadenopathy is identified. Reproductive: The bladder is relatively decompressed and not well assessed. The patient is status post hysterectomy. No suspicious adnexal masses are seen. Other: No additional soft tissue abnormalities are seen. Musculoskeletal: No acute osseous abnormalities are identified. 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 The visualized musculature is unremarkable in appearance. IMPRESSION: 1. No acute abnormality seen to explain the patient's symptoms. 2. Mild diffuse colonic distention, concerning for mild ileus. 3. Mild diverticulosis at the sigmoid colon, without evidence of diverticulitis. 4. Scattered aortic atherosclerosis. 5. Small left renal cysts noted. 6. 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 Electronically Signed   By: Garald Balding M.D.   On: 06/06/2017 03:15   Dg Abd 2 Views  Result Date: 06/08/2017 CLINICAL DATA:  Ileus. EXAM: ABDOMEN - 2 VIEW COMPARISON:  06/07/2017 FINDINGS: Supine and erect views of the abdomen demonstrate mild gaseous distension of large and small bowel loops. Stomach is decompressed by a nasogastric tube. No evidence for bowel obstruction. Multilevel vertebroplasty. Bones appear  radiolucent. IMPRESSION: Stable appearance of ileus. Electronically Signed   By: Nolon Nations M.D.   On: 06/08/2017 11:16   Dg Abd Portable 1v  Result Date: 06/08/2017 CLINICAL DATA:  NG tube placement EXAM: PORTABLE ABDOMEN - 1 VIEW COMPARISON:  CT 06/06/2017 FINDINGS: Lung bases are clear. Esophageal tube tip overlies the left abdomen presumably over the mid stomach. Mild diffuse gaseous dilatation of the bowel. Surgical clips in the right upper quadrant. Vertebral augmentation changes of multiple thoracic and lumbar vertebra. IMPRESSION: 1. Esophageal tube tip in the left abdomen over expected location of mid stomach 2. Mild diffuse increased bowel gas could be secondary to an ileus Electronically Signed   By: Donavan Foil M.D.   On: 06/08/2017 00:03    Microbiology: No results found for this or any previous visit (from the past 240 hour(s)).   Labs: Basic Metabolic Panel:  Recent Labs Lab 06/05/17 1947 06/07/17 1358 06/07/17 2149 06/08/17 0325 06/08/17 0855 06/09/17 0152  NA 127* 126*  --  131*  --  128*  K 3.7 3.5  --  3.2*  --  4.5  CL 91*  91*  --  96*  --  95*  CO2 23 25  --  24  --  23  GLUCOSE 131* 118*  --  89  --  106*  BUN 13 10  --  6  --  <5*  CREATININE 0.87 0.94 0.75 0.74  --  0.60  CALCIUM 9.3 9.0  --  8.4*  --  9.0  MG  --  2.0 1.8  --  1.9 2.3  PHOS  --   --  2.2*  --   --  4.4   Liver Function Tests:  Recent Labs Lab 06/05/17 1947 06/07/17 1358 06/08/17 0325  AST 22 25 19   ALT 14 12* 12*  ALKPHOS 64 59 52  BILITOT 1.4* 1.3* 1.3*  PROT 7.5 7.0 6.1*  ALBUMIN 4.4 4.3 3.5    Recent Labs Lab 06/05/17 1947 06/07/17 1358  LIPASE 29 27   No results for input(s): AMMONIA in the last 168 hours. CBC:  Recent Labs Lab 06/05/17 1947 06/07/17 1358 06/07/17 2149 06/08/17 0325 06/09/17 0152  WBC 12.6* 8.7 9.0 7.8 7.9  NEUTROABS  --   --   --   --  5.0  HGB 12.7 12.0 12.1 11.5* 12.3  HCT 36.5 34.9* 35.4* 32.2* 35.8*  MCV 89.9 90.2 90.1 90.2  89.9  PLT 364 348 375 321 397   Cardiac Enzymes:  Recent Labs Lab 06/07/17 2149 06/08/17 0325 06/08/17 0855  TROPONINI <0.03 <0.03 <0.03   BNP: BNP (last 3 results) No results for input(s): BNP in the last 8760 hours.  ProBNP (last 3 results) No results for input(s): PROBNP in the last 8760 hours.  CBG:  Recent Labs Lab 06/08/17 0700 06/09/17 0559  GLUCAP 76 99       Signed:  THOMPSON,DANIEL MD.  [facility] 06/09/2017, 12:04 PM

## 2017-06-10 ENCOUNTER — Ambulatory Visit: Payer: Medicare Other | Admitting: Family Medicine

## 2017-06-11 ENCOUNTER — Ambulatory Visit (INDEPENDENT_AMBULATORY_CARE_PROVIDER_SITE_OTHER): Payer: Medicare Other | Admitting: Family Medicine

## 2017-06-11 ENCOUNTER — Encounter: Payer: Self-pay | Admitting: Family Medicine

## 2017-06-11 VITALS — BP 126/74 | HR 101 | Temp 98.2°F | Ht 63.5 in | Wt 107.0 lb

## 2017-06-11 DIAGNOSIS — I10 Essential (primary) hypertension: Secondary | ICD-10-CM

## 2017-06-11 DIAGNOSIS — M5136 Other intervertebral disc degeneration, lumbar region: Secondary | ICD-10-CM | POA: Diagnosis not present

## 2017-06-11 DIAGNOSIS — K567 Ileus, unspecified: Secondary | ICD-10-CM

## 2017-06-11 DIAGNOSIS — E871 Hypo-osmolality and hyponatremia: Secondary | ICD-10-CM

## 2017-06-11 NOTE — Assessment & Plan Note (Signed)
Stable bp in the hospital  Continue to follow closely with decreased po intake
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
## 2017-06-11 NOTE — Assessment & Plan Note (Signed)
Baseline  128 at discharge Is eating salty food/drinking gatorade Watch closely in light of miralax and recent illeus Lab today and plan from there

## 2017-06-11 NOTE — Patient Instructions (Signed)
Try to eat some salty foods - small portions are ok  Also vegetable broth You can take a capful of miralax with 8 oz of fluid up to every 2 hours  senekot as directed is ok  Remember -you don't have much food in you-so there is not a lot of stool If nausea and vomiting return let us know right away  If severe abdominal pain or blood in stool- also let us know   Hold tramadol 1-2 days if possible Then when condition improves- take no more than 2 tramadol twice daily  Acetaminophen (tylenol)- 325 mg is ok to take 2 pills up to every 6 hours for pain (with or without the tramadol)  Labs today for sodium level

## 2017-06-11 NOTE — Assessment & Plan Note (Signed)
Reviewed hospital records, lab results and studies in detail  Likely caused by pain medication (pt was taking 3 tramadol at a time) Improved but not having good bms yet Nl bs on exam- reassuring  Will hold tramadol 1-2 d and return to max 2 pills bid as tolerated (can use acetaminophen for pain) miralax 1 cap with 8oz of fluid up to every 2 hours if needed (watching sodium level closely) senekot prn  Small portion diet/with some salty food if tolerated If n/v or abd pain-alert and go to ED (voiced understanding) Cbc and bmet today  Watch closely

## 2017-06-11 NOTE — Assessment & Plan Note (Addendum)
Chronic pain Treated with tramadol-pt was actually taking 3 pills at one time and developed ileus  Reviewed hospital records, lab results and studies in detail  Will hold for 1-2 d Monitor lab and condition Then return if needed to max 1-2 pills bid - with miralax and stool softener  Disc use of tylenol in addition and limits for dosing

## 2017-06-12 LAB — CBC WITH DIFFERENTIAL/PLATELET
Basophils Absolute: 0.1 10*3/uL (ref 0.0–0.1)
Basophils Relative: 0.7 % (ref 0.0–3.0)
Eosinophils Absolute: 0.1 10*3/uL (ref 0.0–0.7)
Eosinophils Relative: 0.6 % (ref 0.0–5.0)
HCT: 38.1 % (ref 36.0–46.0)
Hemoglobin: 13.1 g/dL (ref 12.0–15.0)
Lymphocytes Relative: 16.1 % (ref 12.0–46.0)
Lymphs Abs: 1.3 10*3/uL (ref 0.7–4.0)
MCHC: 34.3 g/dL (ref 30.0–36.0)
MCV: 93.3 fl (ref 78.0–100.0)
Monocytes Absolute: 1.2 10*3/uL — ABNORMAL HIGH (ref 0.1–1.0)
Monocytes Relative: 14.3 % — ABNORMAL HIGH (ref 3.0–12.0)
Neutro Abs: 5.6 10*3/uL (ref 1.4–7.7)
Neutrophils Relative %: 68.3 % (ref 43.0–77.0)
Platelets: 461 10*3/uL — ABNORMAL HIGH (ref 150.0–400.0)
RBC: 4.08 Mil/uL (ref 3.87–5.11)
RDW: 12.5 % (ref 11.5–15.5)
WBC: 8.2 10*3/uL (ref 4.0–10.5)

## 2017-06-12 LAB — BASIC METABOLIC PANEL
BUN: 15 mg/dL (ref 6–23)
CO2: 28 mEq/L (ref 19–32)
Calcium: 10.6 mg/dL — ABNORMAL HIGH (ref 8.4–10.5)
Chloride: 89 mEq/L — ABNORMAL LOW (ref 96–112)
Creatinine, Ser: 1.03 mg/dL (ref 0.40–1.20)
GFR: 55.86 mL/min — ABNORMAL LOW (ref >60.00)
Glucose, Bld: 114 mg/dL — ABNORMAL HIGH (ref 70–99)
Potassium: 4.9 mEq/L (ref 3.5–5.1)
Sodium: 126 mEq/L — ABNORMAL LOW (ref 135–145)

## 2017-06-13 ENCOUNTER — Telehealth: Payer: Self-pay | Admitting: Family Medicine

## 2017-06-13 NOTE — Telephone Encounter (Signed)
Opened in error

## 2017-06-14 ENCOUNTER — Other Ambulatory Visit: Payer: Self-pay | Admitting: Family Medicine

## 2017-06-14 NOTE — Telephone Encounter (Signed)
Last filled on 04/17/16 #30g with 2 additional refills, please advise

## 2017-06-14 NOTE — Telephone Encounter (Signed)
Please refill times 3 

## 2017-06-14 NOTE — Telephone Encounter (Signed)
done

## 2017-06-18 ENCOUNTER — Telehealth: Payer: Self-pay

## 2017-06-18 ENCOUNTER — Ambulatory Visit (INDEPENDENT_AMBULATORY_CARE_PROVIDER_SITE_OTHER): Payer: Medicare Other | Admitting: Primary Care

## 2017-06-18 ENCOUNTER — Encounter: Payer: Self-pay | Admitting: Primary Care

## 2017-06-18 ENCOUNTER — Other Ambulatory Visit (INDEPENDENT_AMBULATORY_CARE_PROVIDER_SITE_OTHER): Payer: Medicare Other

## 2017-06-18 DIAGNOSIS — K567 Ileus, unspecified: Secondary | ICD-10-CM

## 2017-06-18 DIAGNOSIS — E871 Hypo-osmolality and hyponatremia: Secondary | ICD-10-CM

## 2017-06-18 LAB — BASIC METABOLIC PANEL
BUN: 11 mg/dL (ref 6–23)
CO2: 29 mEq/L (ref 19–32)
Calcium: 9.7 mg/dL (ref 8.4–10.5)
Chloride: 94 mEq/L — ABNORMAL LOW (ref 96–112)
Creatinine, Ser: 0.97 mg/dL (ref 0.40–1.20)
GFR: 59.86 mL/min — ABNORMAL LOW (ref >60.00)
Glucose, Bld: 90 mg/dL (ref 70–99)
Potassium: 4.4 mEq/L (ref 3.5–5.1)
Sodium: 129 mEq/L — ABNORMAL LOW (ref 135–145)

## 2017-06-18 NOTE — Assessment & Plan Note (Signed)
Passing gas and experiencing 2 liquid bowel movements daily. Discussed to reduce Miralax use to once daily with 8 ounces of water. Use Senokot PRN constipation. Exam today unremarkable.
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
## 2017-06-18 NOTE — Progress Notes (Signed)
I commented on her labs before I knew that she had been seen-(asked how she is doing)- it looks like she was adv to cut back on miralax and re check bmp in about a week  Thanks

## 2017-06-18 NOTE — Telephone Encounter (Signed)
Noted, will evaluate. 

## 2017-06-18 NOTE — Telephone Encounter (Signed)
Pt has appt this morning for lab testing and appt with Allie Bossier NP today at 4 pm.

## 2017-06-18 NOTE — Assessment & Plan Note (Signed)
BMP today with a sodium increase to 129. Continue salty food consumption, reduce Miralax to once daily dosing to prevent diarrhea. Use Senokot PRN. Recheck BMP in 1 week.

## 2017-06-18 NOTE — Telephone Encounter (Signed)
PLEASE NOTE: All timestamps contained within this report are represented as [place] Standard Time. CONFIDENTIALTY NOTICE: This fax transmission is intended only for the addressee. It contains information that is legally privileged, confidential or otherwise protected from use or disclosure. If you are not the intended recipient, you are strictly prohibited from reviewing, disclosing, copying using or disseminating any of this information or taking any action in reliance on or regarding this information. If you have received this fax in error, please notify us immediately by telephone so that we can arrange for its return to [place]. Phone: [phone], Toll-Free: [phone], Fax: [phone] Page: 1 of 2 Call Id: [id number] [place] Patient Name: Wendy [name] Gender: Female DOB: [birth year] Age: 71 Y 9 M 30 D Return Phone Number: [phone] (Primary), [phone] (Secondary) City/State/Zip: [place] Client [place] Night - Client Client Site [facility] Tower, Roque Lias - MD Who Is Calling Patient / Member / Family / Caregiver Call Type Triage / Clinical Relationship To Patient Self Return Phone Number [phone] (Primary) Chief Complaint Unclassified Symptom Reason for Call Symptomatic / Request for Health Information Initial Comment Caller States has low sodium, wondering if she can get fluids , she needs to get her sodium levels back up., she don't want to go back to the hosp she states Nurse Assessment Nurse: Jake Samples, RN, Melissa Date/Time ([place] Time): 06/17/2017 5:09:49 PM Confirm and document reason for call. If symptomatic, describe symptoms. ---Caller states that her blood sodium was 128 when she got out of the hospital 06/09/17. She had lab work done at her doctor's office and it is 126. She is wondering what she can do about  it, she is "feeling spaced out", and is "non energetic". Does the PT have any chronic conditions? (i.e. diabetes, asthma, etc.) ---Yes List chronic conditions. ---HTN, osteoporosis Guidelines Guideline Title Affirmed Question Weakness (Generalized) and Fatigue [1] MODERATE weakness (i.e., interferes with work, school, normal activities) AND [2] cause unknown (Exceptions: weakness with acute minor illness, or weakness from poor fluid intake) Disp. Time Eilene Ghazi Time) Disposition Final User 06/17/2017 5:32:19 PM See Physician within 4 Hours (or PCP triage) Yes Jake Samples, RN, Melissa Referrals REFERRED TO PCP OFFICE Care Advice Given Per Guideline * IF OFFICE WILL BE CLOSED AND NO PCP TRIAGE: You need to be seen within the next 3 or 4 hours. A nearby Urgent Care Center is often a good source of care. Another choice is to go to the ER. Go sooner if you become worse. CALL BACK IF: * You become worse. CARE ADVICE given per Weakness and Fatigue (Adult) guideline. Comments User: Cherylin Mylar, RN Date/Time Eilene Ghazi Time): 06/17/2017 5:37:29 PM PLEASE NOTE: All timestamps contained within this report are represented as [place] Standard Time. CONFIDENTIALTY NOTICE: This fax transmission is intended only for the addressee. It contains information that is legally privileged, confidential or otherwise protected from use or disclosure. If you are not the intended recipient, you are strictly prohibited from reviewing, disclosing, copying using or disseminating any of this information or taking any action in reliance on or regarding this information. If you have received this fax in error, please notify us immediately by telephone so that we can arrange for its return to [place]. Phone: [phone], Toll-Free: [phone], Fax: [phone] Page: 2 of 2 Call Id: [id number] Comments States that she does not feel that the urgent care would be able to do anything  for her and that they will send her to the  emergency and she does not want to go there. She states that she would rather be seen in the morning by her doctor that knows about her. She does not feel that it is something that is "urgent", she just feels like her sodium level is low and that is making her feel less energy. She verbalized that she will be calling the office in the morning at 0730 to make an appt to be seen.

## 2017-06-18 NOTE — Patient Instructions (Signed)
Reduce Miralax to 1 capful once daily with 8 ounces of water.  Use the Senokot-S as needed for constipation.  Return in 1 week for repeat sodium and electrolyte check.  Continue consumption of salty foods as discussed.  It was a pleasure meeting you!

## 2017-06-19 ENCOUNTER — Telehealth: Payer: Self-pay | Admitting: Family Medicine

## 2017-06-19 NOTE — Telephone Encounter (Signed)
Patient returned Wendy [name]'s call. °

## 2017-06-20 ENCOUNTER — Other Ambulatory Visit: Payer: Medicare Other

## 2017-06-20 NOTE — Telephone Encounter (Signed)
Addressed through result notes  

## 2017-06-25 ENCOUNTER — Other Ambulatory Visit (INDEPENDENT_AMBULATORY_CARE_PROVIDER_SITE_OTHER): Payer: Medicare Other

## 2017-06-25 DIAGNOSIS — E871 Hypo-osmolality and hyponatremia: Secondary | ICD-10-CM

## 2017-06-25 LAB — BASIC METABOLIC PANEL
BUN: 10 mg/dL (ref 6–23)
CO2: 27 mEq/L (ref 19–32)
Calcium: 9.3 mg/dL (ref 8.4–10.5)
Chloride: 95 mEq/L — ABNORMAL LOW (ref 96–112)
Creatinine, Ser: 1.01 mg/dL (ref 0.40–1.20)
GFR: 57.13 mL/min — ABNORMAL LOW (ref >60.00)
Glucose, Bld: 115 mg/dL — ABNORMAL HIGH (ref 70–99)
Potassium: 3.9 mEq/L (ref 3.5–5.1)
Sodium: 130 mEq/L — ABNORMAL LOW (ref 135–145)

## 2017-06-27 ENCOUNTER — Telehealth: Payer: Self-pay | Admitting: Family Medicine

## 2017-06-27 NOTE — Telephone Encounter (Signed)
Patient returned Shapale's call.  Patient said she can be reached on her home number for the next hour.  After that, call her back on her cell phone.

## 2017-06-28 NOTE — Telephone Encounter (Signed)
Patient returned Shapale's call.  Patient can be reached at [phone].

## 2017-06-28 NOTE — Telephone Encounter (Signed)
Left another voicemail requesting pt to call the office back

## 2017-06-28 NOTE — Telephone Encounter (Signed)
Left voicemail requesting pt to call the office back 

## 2017-07-01 NOTE — Telephone Encounter (Signed)
I was able to talk to pt and addressed it through result notes

## 2017-07-01 NOTE — Telephone Encounter (Signed)
Please call patient back on her cell phone [phone].

## 2017-07-05 ENCOUNTER — Encounter (INDEPENDENT_AMBULATORY_CARE_PROVIDER_SITE_OTHER): Payer: Self-pay | Admitting: Family

## 2017-07-05 ENCOUNTER — Ambulatory Visit (INDEPENDENT_AMBULATORY_CARE_PROVIDER_SITE_OTHER): Payer: Medicare Other

## 2017-07-05 ENCOUNTER — Ambulatory Visit (INDEPENDENT_AMBULATORY_CARE_PROVIDER_SITE_OTHER): Payer: Medicare Other | Admitting: Family

## 2017-07-05 ENCOUNTER — Ambulatory Visit (INDEPENDENT_AMBULATORY_CARE_PROVIDER_SITE_OTHER): Payer: Medicare Other | Admitting: Orthopedic Surgery

## 2017-07-05 VITALS — Ht 63.0 in | Wt 109.0 lb

## 2017-07-05 DIAGNOSIS — M4854XA Collapsed vertebra, not elsewhere classified, thoracic region, initial encounter for fracture: Secondary | ICD-10-CM

## 2017-07-05 DIAGNOSIS — M546 Pain in thoracic spine: Secondary | ICD-10-CM
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
## 2017-07-07 ENCOUNTER — Encounter: Payer: Self-pay | Admitting: Family Medicine

## 2017-07-10 ENCOUNTER — Telehealth (INDEPENDENT_AMBULATORY_CARE_PROVIDER_SITE_OTHER): Payer: Self-pay | Admitting: Family

## 2017-07-10 NOTE — Telephone Encounter (Signed)
PT WANTS TO KNOW IF SHE CAN BE REFERRED TO PHYSICAL THERAPY SOMEWHERE CLOSER TO HER HOME PLEASE.  [phone]

## 2017-07-10 NOTE — Telephone Encounter (Signed)
I called and left voicemail for patient to advise we will refer her to [facility]. I will fax them the order with her demographics/insurance. Advised if they dont call her to schedule within the next couple days she can call them at [phone] and they can schedule her. Advised this group is approximately 10 minutes away from her home.

## 2017-07-16 ENCOUNTER — Other Ambulatory Visit: Payer: Self-pay | Admitting: Family Medicine

## 2017-07-16 NOTE — Telephone Encounter (Signed)
Px written for call in   

## 2017-07-16 NOTE — Telephone Encounter (Signed)
Last office visit 06/18/2017 with K.Clark.  Last refilled 02/04/2017 for #120 with 3 refills.  Ok to refill?

## 2017-07-16 NOTE — Telephone Encounter (Signed)
Rx called in as prescribed 

## 2017-07-25 ENCOUNTER — Other Ambulatory Visit (INDEPENDENT_AMBULATORY_CARE_PROVIDER_SITE_OTHER): Payer: Medicare Other

## 2017-07-25 DIAGNOSIS — M810 Age-related osteoporosis without current pathological fracture: Secondary | ICD-10-CM

## 2017-07-25 LAB — CALCIUM: Calcium: 9.5 mg/dL (ref 8.4–10.5)

## 2017-07-25 NOTE — Telephone Encounter (Signed)
Verification of benefits have been processed and an approval has been received for pts prolia injection. Pts estimated cost are appx $0. This is only an estimate and cannot be confirmed until benefits are paid. Please advise pt and schedule if needed. If scheduled, once the injection is received, pls contact me back with the date it was received so that I am able to update prolia folder. thanks   Spoke to pt and advised. Ca lab and prolia injection scheduled.

## 2017-07-31 ENCOUNTER — Ambulatory Visit (INDEPENDENT_AMBULATORY_CARE_PROVIDER_SITE_OTHER): Payer: Medicare Other | Admitting: *Deleted

## 2017-07-31 DIAGNOSIS — M810 Age-related osteoporosis without current pathological fracture: Secondary | ICD-10-CM

## 2017-08-01 MED ORDER — DENOSUMAB 60 MG/ML ~~LOC~~ SOLN
60.0000 mg | SUBCUTANEOUS | Status: DC
  Administered 2017-07-31: 60 mg via SUBCUTANEOUS

## 2017-08-13 ENCOUNTER — Other Ambulatory Visit: Payer: Self-pay | Admitting: Family Medicine

## 2017-08-13 NOTE — Telephone Encounter (Signed)
CPE scheduled 09/25/17, last filled on 02/19/17 #30 tabs with 5 additional refills, ? If it's to soon

## 2017-08-13 NOTE — Telephone Encounter (Signed)
Looks like this is a bit too early  Have her call back next week closer to when she is out of it

## 2017-08-15 ENCOUNTER — Other Ambulatory Visit: Payer: Self-pay | Admitting: Family Medicine

## 2017-08-15 NOTE — Telephone Encounter (Signed)
Last filled on 02/19/17 #30 tabs with 5 additional refills, CPE scheduled 09/25/17

## 2017-08-15 NOTE — Telephone Encounter (Signed)
Px written for call in   

## 2017-08-16 NOTE — Telephone Encounter (Signed)
Rx Ambien called into [facility] pharmacy.

## 2017-08-29 ENCOUNTER — Ambulatory Visit (INDEPENDENT_AMBULATORY_CARE_PROVIDER_SITE_OTHER): Payer: Medicare Other | Admitting: Specialist

## 2017-08-29 ENCOUNTER — Encounter (INDEPENDENT_AMBULATORY_CARE_PROVIDER_SITE_OTHER): Payer: Self-pay | Admitting: Specialist

## 2017-08-29 VITALS — BP 156/83 | HR 98 | Ht 64.0 in | Wt 110.0 lb

## 2017-08-29 DIAGNOSIS — M4854XA Collapsed vertebra, not elsewhere classified, thoracic region, initial encounter for fracture: Secondary | ICD-10-CM | POA: Diagnosis not present

## 2017-08-29 DIAGNOSIS — M25512 Pain in left shoulder: Secondary | ICD-10-CM | POA: Diagnosis not present

## 2017-08-29 DIAGNOSIS — M546 Pain in thoracic spine: Secondary | ICD-10-CM
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
## 2017-09-11 HISTORY — PX: CATARACT EXTRACTION W/ INTRAOCULAR LENS IMPLANT: SHX1309

## 2017-09-18 ENCOUNTER — Telehealth: Payer: Self-pay | Admitting: Family Medicine

## 2017-09-18 DIAGNOSIS — I10 Essential (primary) hypertension: Secondary | ICD-10-CM

## 2017-09-18 NOTE — Telephone Encounter (Signed)
-----   Message from Eustace Pen, LPN sent at 3/73/6681  9:43 PM EDT ----- Regarding: Labs 9/27 Lab orders needed.   Medicare

## 2017-09-19 ENCOUNTER — Ambulatory Visit (INDEPENDENT_AMBULATORY_CARE_PROVIDER_SITE_OTHER): Payer: Medicare Other

## 2017-09-19 ENCOUNTER — Telehealth: Payer: Self-pay | Admitting: Family Medicine

## 2017-09-19 VITALS — BP 134/60 | HR 96 | Temp 98.0°F | Ht 62.5 in | Wt 109.8 lb

## 2017-09-19 DIAGNOSIS — I10 Essential (primary) hypertension: Secondary | ICD-10-CM | POA: Diagnosis not present

## 2017-09-19 DIAGNOSIS — Z0000 Encounter for general adult medical examination without abnormal findings: Secondary | ICD-10-CM | POA: Diagnosis not present

## 2017-09-19 DIAGNOSIS — Z1159 Encounter for screening for other viral diseases: Secondary | ICD-10-CM

## 2017-09-19 DIAGNOSIS — Z23 Encounter for immunization: Secondary | ICD-10-CM | POA: Diagnosis not present

## 2017-09-19 LAB — COMPREHENSIVE METABOLIC PANEL
ALT: 10 U/L (ref 0–35)
AST: 21 U/L (ref 0–37)
Albumin: 4.4 g/dL (ref 3.5–5.2)
Alkaline Phosphatase: 51 U/L (ref 39–117)
BUN: 13 mg/dL (ref 6–23)
CO2: 29 mEq/L (ref 19–32)
Calcium: 9.8 mg/dL (ref 8.4–10.5)
Chloride: 92 mEq/L — ABNORMAL LOW (ref 96–112)
Creatinine, Ser: 0.86 mg/dL (ref 0.40–1.20)
GFR: 68.73 mL/min (ref >60.00)
Glucose, Bld: 109 mg/dL — ABNORMAL HIGH (ref 70–99)
Potassium: 5.1 mEq/L (ref 3.5–5.1)
Sodium: 128 mEq/L — ABNORMAL LOW (ref 135–145)
Total Bilirubin: 0.6 mg/dL (ref 0.2–1.2)
Total Protein: 6.9 g/dL (ref 6.0–8.3)

## 2017-09-19 LAB — CBC WITH DIFFERENTIAL/PLATELET
Basophils Absolute: 0.1 10*3/uL (ref 0.0–0.1)
Basophils Relative: 1 % (ref 0.0–3.0)
Eosinophils Absolute: 0.1 10*3/uL (ref 0.0–0.7)
Eosinophils Relative: 1 % (ref 0.0–5.0)
HCT: 37.4 % (ref 36.0–46.0)
Hemoglobin: 12.6 g/dL (ref 12.0–15.0)
Lymphocytes Relative: 25.7 % (ref 12.0–46.0)
Lymphs Abs: 1.5 10*3/uL (ref 0.7–4.0)
MCHC: 33.8 g/dL (ref 30.0–36.0)
MCV: 97.2 fl (ref 78.0–100.0)
Monocytes Absolute: 0.8 10*3/uL (ref 0.1–1.0)
Monocytes Relative: 14.1 % — ABNORMAL HIGH (ref 3.0–12.0)
Neutro Abs: 3.5 10*3/uL (ref 1.4–7.7)
Neutrophils Relative %: 58.2 % (ref 43.0–77.0)
Platelets: 361 10*3/uL (ref 150.0–400.0)
RBC: 3.85 Mil/uL — ABNORMAL LOW (ref 3.87–5.11)
RDW: 14 % (ref 11.5–15.5)
WBC: 6 10*3/uL (ref 4.0–10.5)

## 2017-09-19 LAB — LIPID PANEL
Cholesterol: 204 mg/dL — ABNORMAL HIGH (ref 0–200)
HDL: 104.8 mg/dL (ref >39.00)
LDL Cholesterol: 86 mg/dL (ref 0–99)
NonHDL: 98.89
Total CHOL/HDL Ratio: 2
Triglycerides: 65 mg/dL (ref 0.0–149.0)
VLDL: 13 mg/dL (ref 0.0–40.0)

## 2017-09-19 LAB — TSH: TSH: 3.34 u[IU]/mL (ref 0.35–4.50)

## 2017-09-19 NOTE — Progress Notes (Signed)
Pre visit review using our clinic review tool, if applicable. No additional management support is needed unless otherwise documented below in the visit note. 
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
## 2017-09-19 NOTE — Telephone Encounter (Signed)
Best number [phone]   Pt stated it was time to get her prloia injection Is it ok to schedule?

## 2017-09-19 NOTE — Telephone Encounter (Signed)
Lm on pts vm and advised she just received injection last month and is not due again, until Feb 2019. I will automatically submit for coverage and contact her back to schedule.

## 2017-09-19 NOTE — Patient Instructions (Signed)
Wendy [name] , Thank you for taking time to come for your Medicare Wellness Visit. I appreciate your ongoing commitment to your health goals. Please review the following plan we discussed and let me know if I can assist you in the future.   These are the goals we discussed: Goals    . Increase physical activity          Starting 09/19/2017, I will continue to walk or garden at least 60 min 4-5 days per week.        This is a list of the screening recommended for you and due dates:  Health Maintenance  Topic Date Due  . Mammogram  10/15/2017  . Colon Cancer Screening  03/15/2020  . Tetanus Vaccine  01/14/2023  . Flu Shot  Addressed  . DEXA scan (bone density measurement)  Completed  .  Hepatitis C: One time screening is recommended by Center for Disease Control  (CDC) for  adults born from [birth year] through 1967.   Completed  . Pneumonia vaccines  Completed   Preventive Care for Adults  A healthy lifestyle and preventive care can promote health and wellness. Preventive health guidelines for adults include the following key practices.  . A routine yearly physical is a good way to check with your health care provider about your health and preventive screening. It is a chance to share any concerns and updates on your health and to receive a thorough exam.  . Visit your dentist for a routine exam and preventive care every 6 months. Brush your teeth twice a day and floss once a day. Good oral hygiene prevents tooth decay and gum disease.  . The frequency of eye exams is based on your age, health, family medical history, use  of contact lenses, and other factors. Follow your health care provider's ecommendations for frequency of eye exams.  . Eat a healthy diet. Foods like vegetables, fruits, whole grains, low-fat dairy products, and lean protein foods contain the nutrients you need without too many calories. Decrease your intake of foods high in solid fats, added sugars, and salt. Eat the right  amount of calories for you. Get information about a proper diet from your health care provider, if necessary.  . Regular physical exercise is one of the most important things you can do for your health. Most adults should get at least 150 minutes of moderate-intensity exercise (any activity that increases your heart rate and causes you to sweat) each week. In addition, most adults need muscle-strengthening exercises on 2 or more days a week.  Silver Sneakers may be a benefit available to you. To determine eligibility, you may visit the website: www.silversneakers.com or contact program at [phone])851-0309 Mon-Fri between 8AM-8PM.   . Maintain a healthy weight. The body mass index (BMI) is a screening tool to identify possible weight problems. It provides an estimate of body fat based on height and weight. Your health care provider can find your BMI and can help you achieve or maintain a healthy weight.   For adults 20 years and older: ? A BMI below 18.5 is considered underweight. ? A BMI of 18.5 to 24.9 is normal. ? A BMI of 25 to 29.9 is considered overweight. ? A BMI of 30 and above is considered obese.   . Maintain normal blood lipids and cholesterol levels by exercising and minimizing your intake of saturated fat. Eat a balanced diet with plenty of fruit and vegetables. Blood tests for lipids and cholesterol should begin at  age 7 and be repeated every 5 years. If your lipid or cholesterol levels are high, you are over 50, or you are at high risk for heart disease, you may need your cholesterol levels checked more frequently. Ongoing high lipid and cholesterol levels should be treated with medicines if diet and exercise are not working.  . If you smoke, find out from your health care provider how to quit. If you do not use tobacco, please do not start.  . If you choose to drink alcohol, please do not consume more than 2 drinks per day. One drink is considered to be 12 ounces (355 mL) of beer, 5  ounces (148 mL) of wine, or 1.5 ounces (44 mL) of liquor.  . If you are 64-87 years old, ask your health care provider if you should take aspirin to prevent strokes.  . Use sunscreen. Apply sunscreen liberally and repeatedly throughout the day. You should seek shade when your shadow is shorter than you. Protect yourself by wearing long sleeves, pants, a wide-brimmed hat, and sunglasses year round, whenever you are outdoors.  . Once a month, do a whole body skin exam, using a mirror to look at the skin on your back. Tell your health care provider of new moles, moles that have irregular borders, moles that are larger than a pencil eraser, or moles that have changed in shape or color.

## 2017-09-19 NOTE — Progress Notes (Signed)
Medical screening examination/treatment/procedure(s) were performed by a registered nurse and as supervising non-physician practitioner I was immediately available for consultation/collaboration.   BAITY, REGINA, NP  

## 2017-09-19 NOTE — Progress Notes (Signed)
PCP notes:   Health maintenance:  Flu vaccine - administered Hep C screening- completed  Abnormal screenings:   None  Patient concerns:   None  Nurse concerns:  None  Next PCP appt:   09/25/17 @ 1530

## 2017-09-20 LAB — HEPATITIS C ANTIBODY
Hepatitis C Ab: NONREACTIVE
SIGNAL TO CUT-OFF: 0.01 (ref <1.00)

## 2017-09-25 ENCOUNTER — Other Ambulatory Visit: Payer: Self-pay | Admitting: Family Medicine

## 2017-09-25 ENCOUNTER — Ambulatory Visit (INDEPENDENT_AMBULATORY_CARE_PROVIDER_SITE_OTHER): Payer: Medicare Other | Admitting: Family Medicine

## 2017-09-25 ENCOUNTER — Encounter: Payer: Self-pay | Admitting: Family Medicine

## 2017-09-25 VITALS — BP 132/68 | HR 92 | Temp 97.7°F | Ht 62.5 in | Wt 111.5 lb

## 2017-09-25 DIAGNOSIS — I10 Essential (primary) hypertension: Secondary | ICD-10-CM

## 2017-09-25 DIAGNOSIS — R739 Hyperglycemia, unspecified: Secondary | ICD-10-CM | POA: Diagnosis not present

## 2017-09-25 DIAGNOSIS — M810 Age-related osteoporosis without current pathological fracture: Secondary | ICD-10-CM | POA: Diagnosis not present

## 2017-09-25 DIAGNOSIS — E871 Hypo-osmolality and hyponatremia: Secondary | ICD-10-CM

## 2017-09-25 DIAGNOSIS — Z8719 Personal history of other diseases of the digestive system: Secondary | ICD-10-CM

## 2017-09-25 DIAGNOSIS — Z1231 Encounter for screening mammogram for malignant neoplasm of breast: Secondary | ICD-10-CM

## 2017-09-25 MED ORDER — HYDROCORTISONE 2.5 % EX CREA: TOPICAL_CREAM | Freq: Two times a day (BID) | CUTANEOUS | 1 refills | Status: DC | PRN

## 2017-09-25 MED ORDER — AMLODIPINE BESYLATE 10 MG PO TABS: 10.0000 mg | ORAL_TABLET | Freq: Every day | ORAL | 3 refills | Status: DC

## 2017-09-25 MED ORDER — BENAZEPRIL HCL 20 MG PO TABS: 20.0000 mg | ORAL_TABLET | Freq: Every day | ORAL | 3 refills | Status: DC

## 2017-09-25 MED ORDER — FLUTICASONE PROPIONATE 50 MCG/ACT NA SUSP: NASAL | 11 refills | Status: DC

## 2017-09-25 NOTE — Assessment & Plan Note (Signed)
Doing much better  Watching diet

## 2017-09-25 NOTE — Assessment & Plan Note (Signed)
Pt is slim and a vegetarian-do not want weight loss  Disc inc protein and red carbs Continue to follow
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
## 2017-09-25 NOTE — Patient Instructions (Addendum)
Try to eat more protein instead of carbohydrates - try more nut butters  Glucose was borderline and we need to watch it   Continue current medicines  Get your mammogram as planned (here is the number to schedule)   Stay mindful of your liquid and salt intake -sodium level is still low

## 2017-09-25 NOTE — Assessment & Plan Note (Signed)
With another spinal compression fracture  Now on prolia and doing well  Next dexa due 1 y  Disc ca and D and exercise as tolerated Fall prev/safety

## 2017-09-25 NOTE — Assessment & Plan Note (Signed)
Sodium level 128  Again counseled on water restriction and salt intake Continue to follow

## 2017-10-03 ENCOUNTER — Encounter (INDEPENDENT_AMBULATORY_CARE_PROVIDER_SITE_OTHER): Payer: Self-pay | Admitting: Surgery

## 2017-10-03 ENCOUNTER — Ambulatory Visit (INDEPENDENT_AMBULATORY_CARE_PROVIDER_SITE_OTHER): Payer: Medicare Other | Admitting: Surgery

## 2017-10-03 VITALS — BP 149/84 | HR 98 | Ht 62.5 in | Wt 111.0 lb

## 2017-10-03 DIAGNOSIS — M546 Pain in thoracic spine: Secondary | ICD-10-CM | POA: Diagnosis not present

## 2017-10-03 DIAGNOSIS — M25512 Pain in left shoulder: Secondary | ICD-10-CM
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
## 2017-10-21 ENCOUNTER — Other Ambulatory Visit: Payer: Self-pay

## 2017-10-21 ENCOUNTER — Telehealth: Payer: Self-pay | Admitting: Family Medicine

## 2017-10-21 MED ORDER — ALCLOMETASONE DIPROPIONATE 0.05 % EX OINT: TOPICAL_OINTMENT | CUTANEOUS | 3 refills | Status: AC

## 2017-10-21 MED ORDER — ALCLOMETASONE DIPROPIONATE 0.05 % EX OINT: TOPICAL_OINTMENT | CUTANEOUS | 3 refills | Status: DC

## 2017-10-21 NOTE — Telephone Encounter (Signed)
Sent to [employer]

## 2017-10-21 NOTE — Telephone Encounter (Signed)
Wendy [name] is fine  Please let pt know it will be changed first to make sure she is ok with that

## 2017-10-21 NOTE — Telephone Encounter (Signed)
Copied from [place] #[id number]. Topic: Inquiry >> Oct 21, 2017 10:40 AM Pricilla Handler wrote: Reason for CRM: Patient has called requesting a refill of Alcometasone Ointment. Please call patient to confirm. Thank You!!!

## 2017-10-21 NOTE — Telephone Encounter (Signed)
Rx sent to [facility] not [facility] per pt request, pt only wants the ointment, she said she will let [facility] know that she doesn't mind waiting for the ointment because she doesn't want the cream, called [facility] and cancelled the Rx

## 2017-10-21 NOTE — Telephone Encounter (Signed)
Copied from [place]. Topic: Inquiry >> Oct 21, 2017 12:23 PM Malena Catholic I, [place] wrote: Reason for [place] about a creme  And need to talk to some one from the office Her num is [employer]  >> Oct 21, 2017  1:34 PM Tower, Wynelle Fanny, MD wrote: I sent it in

## 2017-10-21 NOTE — Telephone Encounter (Signed)
Landon from [facility] called and cannot get the Aclovate ointment but can get the cream; is it OK to substitute cream?

## 2017-10-29 ENCOUNTER — Ambulatory Visit
Admission: RE | Admit: 2017-10-29 | Discharge: 2017-10-29 | Disposition: A | Discharge status: Home / Self Care | Payer: Medicare Other | Source: Ambulatory Visit | Attending: Family Medicine | Admitting: Family Medicine

## 2017-10-29 DIAGNOSIS — Z1231 Encounter for screening mammogram for malignant neoplasm of breast: Secondary | ICD-10-CM

## 2017-11-12 ENCOUNTER — Ambulatory Visit (INDEPENDENT_AMBULATORY_CARE_PROVIDER_SITE_OTHER): Payer: Medicare Other | Admitting: Orthopaedic Surgery

## 2017-11-12 ENCOUNTER — Encounter (INDEPENDENT_AMBULATORY_CARE_PROVIDER_SITE_OTHER): Payer: Self-pay | Admitting: Orthopaedic Surgery

## 2017-11-12 DIAGNOSIS — G5602 Carpal tunnel syndrome, left upper limb: Secondary | ICD-10-CM | POA: Diagnosis not present
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
## 2017-11-29 DIAGNOSIS — G5602 Carpal tunnel syndrome, left upper limb: Secondary | ICD-10-CM | POA: Diagnosis not present

## 2017-12-10 ENCOUNTER — Other Ambulatory Visit: Payer: Self-pay | Admitting: Family Medicine

## 2017-12-10 NOTE — Telephone Encounter (Signed)
Rx called in as prescribed 

## 2017-12-10 NOTE — Telephone Encounter (Signed)
CPE was 09/25/17 and next years CPE is already scheduled last filled on 08/15/17 #30 tabs with 3 additional refills, please advise

## 2017-12-10 NOTE — Telephone Encounter (Signed)
Px written for call in   

## 2017-12-13 ENCOUNTER — Ambulatory Visit (INDEPENDENT_AMBULATORY_CARE_PROVIDER_SITE_OTHER): Payer: Medicare Other | Admitting: Orthopaedic Surgery

## 2017-12-13 DIAGNOSIS — G5602 Carpal tunnel syndrome, left upper limb: Secondary | ICD-10-CM

## 2017-12-13 NOTE — Progress Notes (Signed)
Wendy [name] is 2 weeks status post left carpal tunnel release.  She is doing well.  She has minimal pain.  Sutures were removed today.  Incision is healed without signs of infection.  At this point she is comfortable with just following up as needed since she has had her right carpal tunnel release.  Follow-up as needed.

## 2017-12-31 ENCOUNTER — Ambulatory Visit (INDEPENDENT_AMBULATORY_CARE_PROVIDER_SITE_OTHER): Payer: Medicare Other | Admitting: Family Medicine

## 2017-12-31 ENCOUNTER — Encounter: Payer: Self-pay | Admitting: Family Medicine

## 2017-12-31 ENCOUNTER — Telehealth: Payer: Self-pay | Admitting: Family Medicine

## 2017-12-31 VITALS — BP 182/94 | HR 96 | Temp 98.1°F | Wt 115.5 lb

## 2017-12-31 DIAGNOSIS — I10 Essential (primary) hypertension: Secondary | ICD-10-CM | POA: Diagnosis not present

## 2017-12-31 DIAGNOSIS — K5909 Other constipation: Secondary | ICD-10-CM

## 2017-12-31 MED ORDER — LOSARTAN POTASSIUM 100 MG PO TABS: 100.0000 mg | ORAL_TABLET | Freq: Every day | ORAL | 11 refills | Status: DC

## 2017-12-31 NOTE — Telephone Encounter (Signed)
appt scheduled today at 3:00pm per Dr. Glori Bickers

## 2017-12-31 NOTE — Patient Instructions (Signed)
Hold the benazepril and substitute losartan 100 mg once daily  Continue holding amlodipine Follow up with [place] in 1-2 weeks  Drink water but do not overdo it  Avoid sodium Do not drink excessive tea or soda  Stay active

## 2017-12-31 NOTE — Telephone Encounter (Signed)
Copied from [place] #[id number]. Topic: Quick Communication - See Telephone Encounter >> Dec 31, 2017 10:21 AM Ahmed Prima L wrote: CRM for notification. See Telephone encounter for:   12/31/17.  Pt said she has some questions about her amLODipine (NORVASC) 10 MG tablet & her benazepril (LOTENSIN) 20 MG tablet. [facility] nurse line busy. Please call patient back [phone]

## 2017-12-31 NOTE — Telephone Encounter (Signed)
Please come in for a visit so we can examine/check bp and discuss opt further

## 2017-12-31 NOTE — Telephone Encounter (Signed)
I spoke with pt and pt having problem with constipation. Pt thought might be tramadol causing problem so stopped tramadol for 5 days and did not improve constipation. Pt restarted tramadol.pt taking miralax. Over holidays pt was out of Amlodipine and benzapril for 4 days; constipation cleared. Pt read that amlodipine could cause constipation. BP meds working to keep BP in check but pt does not want constipation. Pt wants to know if needs to change BP med or get GI consult. Pt request cb. Pt does not want new med called in until pt gets cb due to just have BP meds filled. Please advise. Pt last seen 09/25/17.
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
## 2018-01-01 NOTE — Assessment & Plan Note (Signed)
Pt has remote hx of ilius as well  Suspects amlodipine is worsening it  Hold for now  Change ace to high dose arb F/u 1-2 wk Disc fluids/fiber/exercise and use of miralax

## 2018-01-01 NOTE — Assessment & Plan Note (Signed)
Pt suspects her amlodipine is causing constipation  bp is up holding it and benazepril  Plan to try losartan 100 mg instead of ace  conitnue to hold the amlodipine  F/u 1-2 wk with visit and labs   Unable to take diuretic (hyponatremia), clionidine (intol) and has tried a beta blocker before  Disc further management at f/u  BP: (!) 182/94

## 2018-01-08 ENCOUNTER — Ambulatory Visit (INDEPENDENT_AMBULATORY_CARE_PROVIDER_SITE_OTHER): Payer: Medicare Other | Admitting: Surgery

## 2018-01-14 ENCOUNTER — Encounter: Payer: Self-pay | Admitting: Family Medicine

## 2018-01-14 ENCOUNTER — Ambulatory Visit (INDEPENDENT_AMBULATORY_CARE_PROVIDER_SITE_OTHER): Payer: Medicare Other | Admitting: Family Medicine

## 2018-01-14 VITALS — BP 142/70 | HR 103 | Temp 97.7°F | Ht 62.5 in | Wt 111.5 lb

## 2018-01-14 DIAGNOSIS — E871 Hypo-osmolality and hyponatremia: Secondary | ICD-10-CM | POA: Diagnosis not present

## 2018-01-14 DIAGNOSIS — K5909 Other constipation: Secondary | ICD-10-CM | POA: Diagnosis not present

## 2018-01-14 DIAGNOSIS — I10 Essential (primary) hypertension: Secondary | ICD-10-CM

## 2018-01-14 LAB — BASIC METABOLIC PANEL
BUN: 12 mg/dL (ref 6–23)
CO2: 28 mEq/L (ref 19–32)
Calcium: 9 mg/dL (ref 8.4–10.5)
Chloride: 94 mEq/L — ABNORMAL LOW (ref 96–112)
Creatinine, Ser: 0.75 mg/dL (ref 0.40–1.20)
GFR: 80.42 mL/min (ref >60.00)
Glucose, Bld: 84 mg/dL (ref 70–99)
Potassium: 4.2 mEq/L (ref 3.5–5.1)
Sodium: 129 mEq/L — ABNORMAL LOW (ref 135–145)

## 2018-01-14 MED ORDER — CLONIDINE HCL 0.1 MG PO TABS: 0.1000 mg | ORAL_TABLET | Freq: Two times a day (BID) | ORAL | 11 refills | Status: DC

## 2018-01-14 NOTE — Patient Instructions (Signed)
Continue losartan  Add clonidine 0.1 mg twice daily  If any side effects or problems let us know   Take care of yourself   Labs today for sodium level and kidney function    Follow up in 1-2 months

## 2018-01-14 NOTE — Assessment & Plan Note (Signed)
bp is improved/not quite at goal and generally higher at home than here  BP Readings from Last 1 Encounters:  01/14/18 (!) 142/70  constipation is improved off the amlodipine Tolerating losartan (bmp today) Will try adding clonidine 0.1 mg bid  (had topical rxn to patch in the past) so will try oral  Disc poss side eff-call if problems or hypotension Good lifestyle habits- cannot eat low sodium due to baseline hyponatremia  F/u 1-2 mo
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
## 2018-01-15 NOTE — Assessment & Plan Note (Signed)
Much improvement off of amlodipine  Adding clonidine (oral) for bp- will alert if side eff  Nl exam  Continue miralax prn or qd if needed

## 2018-01-15 NOTE — Assessment & Plan Note (Signed)
Lab today  Chronic  Disc imp of limiting fluids/she is aware

## 2018-01-16 ENCOUNTER — Telehealth: Payer: Self-pay | Admitting: Family Medicine

## 2018-01-16 NOTE — Telephone Encounter (Signed)
Stop it  Watch bp off of it and alert is in the next several weeks

## 2018-01-16 NOTE — Telephone Encounter (Signed)
Pt notified of Dr. Marliss Coots instructions and verbalized understanding. Will update [place] if BP becomes elevated again

## 2018-01-16 NOTE — Telephone Encounter (Signed)
Pt. Called c/o reaction to new medicine - Clonidine. States she is having dizziness, lightheadedness, abdominal cramping, and feels very "dry". Is "afraid to take anymore."

## 2018-01-16 NOTE — Addendum Note (Signed)
Addended by: Tammi Sou on: 01/16/2018 03:16 PM   Modules accepted: Orders

## 2018-01-28 ENCOUNTER — Telehealth: Payer: Self-pay | Admitting: *Deleted

## 2018-01-28 NOTE — Telephone Encounter (Signed)
Spoke to pt and advised. Pt scheduled for 2/21; Ca lab previously completed

## 2018-01-28 NOTE — Telephone Encounter (Signed)
Verification of benefits have been processed and an approval has been received for pts prolia injection. Pts estimated cost are appx $0. This is only an estimate and cannot be confirmed until benefits are paid. Please advise pt and schedule if needed. If scheduled, once the injection is received, pls contact me back with the date it was received so that I am able to update prolia folder. thanks  

## 2018-01-30 ENCOUNTER — Telehealth: Payer: Self-pay | Admitting: Family Medicine

## 2018-01-30 NOTE — Telephone Encounter (Signed)
Allegra and allegra D are not on current or hx med list.Please advise.

## 2018-01-30 NOTE — Telephone Encounter (Signed)
Copied from [place]. Topic: Quick Communication - See Telephone Encounter >> Jan 30, 2018  4:07 PM Vernona Rieger wrote: CRM for notification. See Telephone encounter for:   01/30/18.  Patient just wants to know if she can take just the allegra not the allegra D. The cedar pollen is bothering her. Call back is [phone]

## 2018-01-30 NOTE — Telephone Encounter (Signed)
Yes-plain allegra should be ok

## 2018-01-30 NOTE — Telephone Encounter (Signed)
Pt.notified

## 2018-02-13 ENCOUNTER — Ambulatory Visit (INDEPENDENT_AMBULATORY_CARE_PROVIDER_SITE_OTHER): Payer: Medicare Other

## 2018-02-13 DIAGNOSIS — M810 Age-related osteoporosis without current pathological fracture: Secondary | ICD-10-CM | POA: Diagnosis not present

## 2018-02-13 MED ORDER — DENOSUMAB 60 MG/ML ~~LOC~~ SOLN
60.0000 mg | Freq: Once | SUBCUTANEOUS | Status: AC
  Administered 2018-02-13: 60 mg via SUBCUTANEOUS

## 2018-02-13 NOTE — Telephone Encounter (Signed)
Pt received Prolia inj today.  

## 2018-02-26 ENCOUNTER — Other Ambulatory Visit: Payer: Self-pay | Admitting: *Deleted

## 2018-02-26 MED ORDER — TRAMADOL HCL 50 MG PO TABS: 50.0000 mg | ORAL_TABLET | Freq: Two times a day (BID) | ORAL | 0 refills | Status: DC | PRN

## 2018-02-26 NOTE — Telephone Encounter (Signed)
F/u scheduled for 03/04/18, and also CPE is scheduled in Oct. Last filled on 07/16/17 #120 tabs with 3 additional refills, please advise

## 2018-02-26 NOTE — Telephone Encounter (Signed)
Will refill electronically  

## 2018-03-04 ENCOUNTER — Encounter: Payer: Self-pay | Admitting: Family Medicine

## 2018-03-04 ENCOUNTER — Ambulatory Visit (INDEPENDENT_AMBULATORY_CARE_PROVIDER_SITE_OTHER): Payer: Medicare Other | Admitting: Family Medicine

## 2018-03-04 VITALS — BP 130/78 | HR 89 | Temp 97.5°F | Ht 62.5 in | Wt 118.2 lb

## 2018-03-04 DIAGNOSIS — I10 Essential (primary) hypertension: Secondary | ICD-10-CM | POA: Diagnosis not present

## 2018-03-04 DIAGNOSIS — E871 Hypo-osmolality and hyponatremia: Secondary | ICD-10-CM | POA: Diagnosis not present

## 2018-03-04 DIAGNOSIS — K5909 Other constipation: Secondary | ICD-10-CM | POA: Diagnosis not present

## 2018-03-04 NOTE — Assessment & Plan Note (Signed)
Chronic Pt continues to be mindful to limit fluid intake Asymptomatic Avoiding diuretics

## 2018-03-04 NOTE — Patient Instructions (Signed)
Blood pressure is better  Continue current medicines  Keep up the walking and good health habits   Be mindful of fluids for low sodium level  See you in the fall

## 2018-03-04 NOTE — Assessment & Plan Note (Signed)
Despite intolerance of oral clonidine -bp is improved today  Pt stopped checking at home due to anxiety rxn  Is walking regularly now  Will continue losartan  If this goes up again pt is willing to try amlodipine (which inc constipation- and inc her miralax)  For now continue the course F/u fall
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
## 2018-03-04 NOTE — Assessment & Plan Note (Signed)
Pt has had to take miralax daily -this keeps it under control  Enc to keep walking and eating fiber Fluids are limited due to chronic hyponatremia

## 2018-04-09 ENCOUNTER — Other Ambulatory Visit: Payer: Self-pay | Admitting: Family Medicine

## 2018-04-09 NOTE — Telephone Encounter (Signed)
CPE/AWV visit scheduled for 09/2018, last filled on 12/10/17 #30 tabs with 3 additional refills

## 2018-06-02 ENCOUNTER — Ambulatory Visit (INDEPENDENT_AMBULATORY_CARE_PROVIDER_SITE_OTHER): Payer: Medicare Other | Admitting: Family Medicine

## 2018-06-02 ENCOUNTER — Encounter: Payer: Self-pay | Admitting: Family Medicine

## 2018-06-02 VITALS — BP 150/85 | HR 88 | Temp 98.2°F | Ht 62.5 in | Wt 117.5 lb

## 2018-06-02 DIAGNOSIS — I10 Essential (primary) hypertension: Secondary | ICD-10-CM

## 2018-06-02 DIAGNOSIS — R12 Heartburn: Secondary | ICD-10-CM | POA: Insufficient documentation

## 2018-06-02 DIAGNOSIS — K5909 Other constipation: Secondary | ICD-10-CM

## 2018-06-02 DIAGNOSIS — R140 Abdominal distension (gaseous): Secondary | ICD-10-CM | POA: Diagnosis not present

## 2018-06-02 NOTE — Assessment & Plan Note (Signed)
Takes miralax bid  Careful with laxatives due to hyponatremia  Having issue with bloating/heartburn (and what feels like gastroparesis) Will try otc probiotic Continue high fiber diet  Ref to GI
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
## 2018-06-02 NOTE — Patient Instructions (Addendum)
Try Align over the counter daily as directed  Stop the tums    Let's get you set up with a GI appt   Try 1/2 of a 10 mg amlodipine once daily - if too constipated please stop it and let me know (or increase miralax)   Follow up in about 6 weeks

## 2018-06-02 NOTE — Assessment & Plan Note (Signed)
Fair control with zantac  Will stop tums due to constipation  Some bloating  Ref made to GI

## 2018-06-02 NOTE — Assessment & Plan Note (Signed)
With chronic constipation/ heartburn Trial of probiotic  Ref to GI

## 2018-06-02 NOTE — Assessment & Plan Note (Signed)
BP: (!) 150/85    Will try adding back amlodipine at 5 mg (1/2 of the 10 she has at home) Watch for worse constipation and update F/u in 6 weeks approx  Disc health habits No diuretics due to hyponatremia  On losartan

## 2018-06-17 ENCOUNTER — Encounter: Payer: Self-pay | Admitting: Gastroenterology

## 2018-06-17 ENCOUNTER — Ambulatory Visit (INDEPENDENT_AMBULATORY_CARE_PROVIDER_SITE_OTHER): Payer: Medicare Other | Admitting: Gastroenterology

## 2018-06-17 VITALS — BP 132/76 | HR 97 | Ht 64.0 in | Wt 118.0 lb

## 2018-06-17 DIAGNOSIS — R140 Abdominal distension (gaseous): Secondary | ICD-10-CM | POA: Diagnosis not present

## 2018-06-17 DIAGNOSIS — R12 Heartburn: Secondary | ICD-10-CM

## 2018-06-17 DIAGNOSIS — K5909 Other constipation: Secondary | ICD-10-CM

## 2018-06-17 DIAGNOSIS — Z86010 Personal history of colonic polyps: Secondary | ICD-10-CM

## 2018-06-17 DIAGNOSIS — R1013 Epigastric pain: Secondary | ICD-10-CM

## 2018-06-17 DIAGNOSIS — K588 Other irritable bowel syndrome: Secondary | ICD-10-CM | POA: Diagnosis not present

## 2018-06-17 MED ORDER — LINACLOTIDE 72 MCG PO CAPS: 72.0000 ug | ORAL_CAPSULE | Freq: Every day | ORAL | 3 refills | Status: DC

## 2018-06-17 NOTE — Patient Instructions (Signed)
We have sent Linzess 70mcg to your pharmacy  Use Benefiber 1 tablespoon three times a day with meals  Take Miralax 1 capful daily as needed (Titrate as needed based on response)  Use FDGard/IBGard 1 capsule three times a day as needed  FDGard is for heartburn and dyspepsia  IBGard is for bloating   Avoid eating too many raw vegetables, try cooking veggies or use in smoothies   If you are age 74 or older, your body mass index should be between 23-30. Your Body mass index is 20.25 kg/m. If this is out of the aforementioned range listed, please consider follow up with your Primary Care Provider.  If you are age 41 or younger, your body mass index should be between 19-25. Your Body mass index is 20.25 kg/m. If this is out of the aformentioned range listed, please consider follow up with your Primary Care Provider.
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
## 2018-06-22 ENCOUNTER — Encounter: Payer: Self-pay | Admitting: Gastroenterology

## 2018-07-01 ENCOUNTER — Telehealth: Payer: Self-pay | Admitting: *Deleted

## 2018-07-01 NOTE — Telephone Encounter (Signed)
Information has been submitted to pts insurance for verification of benefits. Awaiting response for coverage  

## 2018-07-10 ENCOUNTER — Ambulatory Visit: Payer: Self-pay | Admitting: Family Medicine

## 2018-07-10 NOTE — Telephone Encounter (Signed)
tums does not have the vitamin D that a calcium supplement generally has  tums may also cause constipation  If she cannot tolerate the calcium- buy separate vitamin D and just take that

## 2018-07-10 NOTE — Telephone Encounter (Signed)
Pt asking if she can substitute Tums for Calcium pills. Calcium is causing constipation.  Reason for Disposition . Caller has NON-URGENT medication question about med that PCP prescribed and triager unable to answer question  Answer Assessment - Initial Assessment Questions 1. SYMPTOMS: "Do you have any symptoms?"     constipation 2. SEVERITY: If symptoms are present, ask "Are they mild, moderate or severe?"     n/a  Protocols used: MEDICATION QUESTION CALL-A-AH

## 2018-07-11 NOTE — Telephone Encounter (Signed)
Pt informed of below and verbalized understanding.  

## 2018-07-22 ENCOUNTER — Encounter: Payer: Self-pay | Admitting: Family Medicine

## 2018-07-22 ENCOUNTER — Ambulatory Visit (INDEPENDENT_AMBULATORY_CARE_PROVIDER_SITE_OTHER): Payer: Medicare Other | Admitting: Family Medicine

## 2018-07-22 VITALS — BP 126/78 | HR 87 | Temp 98.3°F | Ht 64.0 in | Wt 120.8 lb

## 2018-07-22 DIAGNOSIS — R12 Heartburn: Secondary | ICD-10-CM

## 2018-07-22 DIAGNOSIS — K5909 Other constipation: Secondary | ICD-10-CM

## 2018-07-22 DIAGNOSIS — I10 Essential (primary) hypertension: Secondary | ICD-10-CM

## 2018-07-22 NOTE — Assessment & Plan Note (Signed)
S/p GI visit  Continue zantac  Prn tums for breakthrough symptoms  Careful with diet

## 2018-07-22 NOTE — Patient Instructions (Signed)
Your blood pressure is much improved Continue the amlodipine   Stop linzess if it does not work for you  Continue miralax twice daily  [employer]

## 2018-07-22 NOTE — Assessment & Plan Note (Signed)
Pt disliked her GI visit Also intolerant of linzess- inst to stop it (she already did)  Will continue miralax bid with senekot prn Now cooking veggies-does better w/o raw  Probiotic prn

## 2018-07-22 NOTE — Assessment & Plan Note (Signed)
Much improved with amlodipine  Continue this  Good lifestyle habits  bp in fair control at this time  BP Readings from Last 1 Encounters:  07/22/18 126/78   No changes needed Most recent labs reviewed  Disc lifstyle change with low sodium diet and exercise
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
## 2018-08-04 NOTE — Telephone Encounter (Signed)
Patient is requesting a update on her medication  Prolia. Please  advise

## 2018-08-04 NOTE — Telephone Encounter (Signed)
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

## 2018-08-06 ENCOUNTER — Other Ambulatory Visit: Payer: Self-pay | Admitting: Family Medicine

## 2018-08-06 NOTE — Telephone Encounter (Signed)
Name of Medication: Ambien Name of Pharmacy: [facility] or Written Date and Quantity: 04/09/18 #30 tabs with 3 additional refills Last Office Visit and Type: 6 week f/u on 07/22/18 Next Office Visit and Type: CPE on 09/29/18 (AWV also scheduled) Last Controlled Substance Agreement Date: 05/22/13 Last UDS: 05/22/13

## 2018-08-21 ENCOUNTER — Telehealth: Payer: Self-pay

## 2018-08-21 NOTE — Telephone Encounter (Signed)
Please see 07/01/18 phone note.

## 2018-08-21 NOTE — Telephone Encounter (Signed)
Reason for CRM: Patient called and states she is checking the status of the prior auth for her Prolia injections . Please call CB# [phone]

## 2018-08-22 ENCOUNTER — Telehealth: Payer: Self-pay | Admitting: Family Medicine

## 2018-08-22 NOTE — Telephone Encounter (Signed)
Verification of benefits have been processed and an approval has been received for pts prolia injection. Pts estimated cost are appx $0. This is only an estimate and cannot be confirmed until benefits are paid. Please advise pt and schedule if needed. If scheduled, once the injection is received, pls contact me back with the date it was received so that I am able to update prolia folder. thanks  

## 2018-08-22 NOTE — Telephone Encounter (Signed)
I spoke to patient. She didn't realize it was just a 4 hour fast. She said she'd keep the appointment at 12:00.

## 2018-08-22 NOTE — Telephone Encounter (Signed)
Copied from [place] [id number]. Topic: Quick Communication - See Telephone Encounter >> Aug 22, 2018 11:18 AM Neva Seat wrote: Pt wanting to change her time for AWV in Oct to earlier in the morning.

## 2018-08-22 NOTE — Telephone Encounter (Signed)
Can you schedule that?

## 2018-08-26 ENCOUNTER — Other Ambulatory Visit (INDEPENDENT_AMBULATORY_CARE_PROVIDER_SITE_OTHER): Payer: Medicare Other

## 2018-08-26 DIAGNOSIS — M810 Age-related osteoporosis without current pathological fracture: Secondary | ICD-10-CM

## 2018-08-26 NOTE — Telephone Encounter (Signed)
Spoke to pt who is agreeable to out of pocket expenses. Prolia inj previously scheduled; Ca lab scheduled

## 2018-08-27 LAB — CALCIUM: Calcium: 9.5 mg/dL (ref 8.4–10.5)

## 2018-09-02 ENCOUNTER — Ambulatory Visit (INDEPENDENT_AMBULATORY_CARE_PROVIDER_SITE_OTHER): Payer: Medicare Other | Admitting: Emergency Medicine

## 2018-09-02 DIAGNOSIS — Q782 Osteopetrosis: Secondary | ICD-10-CM

## 2018-09-02 MED ORDER — DENOSUMAB 60 MG/ML ~~LOC~~ SOSY
60.0000 mg | PREFILLED_SYRINGE | Freq: Once | SUBCUTANEOUS | Status: DC
  Administered 2018-09-02: 60 mg via SUBCUTANEOUS

## 2018-09-02 NOTE — Progress Notes (Signed)
Per orders of Dr. Glori Bickers, injection of Prolia  given by [facility]. Patient tolerated injection well. Received in left arm

## 2018-09-25 ENCOUNTER — Other Ambulatory Visit: Payer: Self-pay | Admitting: Family Medicine

## 2018-09-25 ENCOUNTER — Ambulatory Visit: Payer: Medicare Other

## 2018-09-25 DIAGNOSIS — Z1231 Encounter for screening mammogram for malignant neoplasm of breast: Secondary | ICD-10-CM

## 2018-09-29 ENCOUNTER — Encounter: Payer: Medicare Other | Admitting: Family Medicine

## 2018-10-02 ENCOUNTER — Ambulatory Visit: Payer: Self-pay | Admitting: Family Medicine

## 2018-10-02 NOTE — Telephone Encounter (Signed)
  Reason for Disposition . General information question, no triage required and triager able to answer question  Answer Assessment - Initial Assessment Questions 1. REASON FOR CALL or QUESTION: "What is your reason for calling today?" or "How can I best help you?" or "What question do you have that I can help answer?"     Pt called to check to see if there is any drug interactions that was prescribed to pt. Meds that was prescribed for cough was tessalon perels, Albuterol, Mucinex, ipratropium nasal spray. Checked theses meds with her current med list on [facility] and no interactions noted. Pt advised to drink fluids, humidifier, and cough drops. Pt verbalized understanding.  Protocols used: INFORMATION ONLY CALL-A-AH

## 2018-10-06 ENCOUNTER — Telehealth: Payer: Self-pay | Admitting: Family Medicine

## 2018-10-06 DIAGNOSIS — M810 Age-related osteoporosis without current pathological fracture: Secondary | ICD-10-CM

## 2018-10-06 DIAGNOSIS — R739 Hyperglycemia, unspecified: Secondary | ICD-10-CM

## 2018-10-06 DIAGNOSIS — I10 Essential (primary) hypertension: Secondary | ICD-10-CM

## 2018-10-06 DIAGNOSIS — E871 Hypo-osmolality and hyponatremia: Secondary | ICD-10-CM

## 2018-10-06 NOTE — Telephone Encounter (Signed)
-----   Message from Eustace Pen, LPN sent at 15/94/7076  2:34 PM EDT ----- Regarding: Labs 10/16 Lab orders needed. Thank you.  Insurance:  [employer]

## 2018-10-08 ENCOUNTER — Other Ambulatory Visit (INDEPENDENT_AMBULATORY_CARE_PROVIDER_SITE_OTHER): Payer: Medicare Other

## 2018-10-08 DIAGNOSIS — R739 Hyperglycemia, unspecified: Secondary | ICD-10-CM | POA: Diagnosis not present

## 2018-10-08 DIAGNOSIS — M810 Age-related osteoporosis without current pathological fracture: Secondary | ICD-10-CM

## 2018-10-08 DIAGNOSIS — I10 Essential (primary) hypertension: Secondary | ICD-10-CM

## 2018-10-08 LAB — COMPREHENSIVE METABOLIC PANEL
ALT: 11 U/L (ref 0–35)
AST: 20 U/L (ref 0–37)
Albumin: 4.2 g/dL (ref 3.5–5.2)
Alkaline Phosphatase: 60 U/L (ref 39–117)
BUN: 13 mg/dL (ref 6–23)
CO2: 27 mEq/L (ref 19–32)
Calcium: 9.7 mg/dL (ref 8.4–10.5)
Chloride: 87 mEq/L — ABNORMAL LOW (ref 96–112)
Creatinine, Ser: 0.88 mg/dL (ref 0.40–1.20)
GFR: 66.74 mL/min (ref >60.00)
Glucose, Bld: 108 mg/dL — ABNORMAL HIGH (ref 70–99)
Potassium: 4.2 mEq/L (ref 3.5–5.1)
Sodium: 123 mEq/L — ABNORMAL LOW (ref 135–145)
Total Bilirubin: 0.6 mg/dL (ref 0.2–1.2)
Total Protein: 6.9 g/dL (ref 6.0–8.3)

## 2018-10-08 LAB — LIPID PANEL
Cholesterol: 196 mg/dL (ref 0–200)
HDL: 101.7 mg/dL (ref >39.00)
LDL Cholesterol: 75 mg/dL (ref 0–99)
NonHDL: 94.61
Total CHOL/HDL Ratio: 2
Triglycerides: 98 mg/dL (ref 0.0–149.0)
VLDL: 19.6 mg/dL (ref 0.0–40.0)

## 2018-10-08 LAB — CBC WITH DIFFERENTIAL/PLATELET
Basophils Absolute: 0 10*3/uL (ref 0.0–0.1)
Basophils Relative: 0.6 % (ref 0.0–3.0)
Eosinophils Absolute: 0.1 10*3/uL (ref 0.0–0.7)
Eosinophils Relative: 1.2 % (ref 0.0–5.0)
HCT: 36.9 % (ref 36.0–46.0)
Hemoglobin: 12.5 g/dL (ref 12.0–15.0)
Lymphocytes Relative: 18.4 % (ref 12.0–46.0)
Lymphs Abs: 1.2 10*3/uL (ref 0.7–4.0)
MCHC: 33.7 g/dL (ref 30.0–36.0)
MCV: 97.6 fl (ref 78.0–100.0)
Monocytes Absolute: 0.8 10*3/uL (ref 0.1–1.0)
Monocytes Relative: 11.2 % (ref 3.0–12.0)
Neutro Abs: 4.6 10*3/uL (ref 1.4–7.7)
Neutrophils Relative %: 68.6 % (ref 43.0–77.0)
Platelets: 348 10*3/uL (ref 150.0–400.0)
RBC: 3.79 Mil/uL — ABNORMAL LOW (ref 3.87–5.11)
RDW: 12.8 % (ref 11.5–15.5)
WBC: 6.7 10*3/uL (ref 4.0–10.5)

## 2018-10-08 LAB — TSH: TSH: 2.22 u[IU]/mL (ref 0.35–4.50)

## 2018-10-08 LAB — HEMOGLOBIN A1C: Hgb A1c MFr Bld: 5.2 % (ref 4.6–6.5)

## 2018-10-08 LAB — VITAMIN D 25 HYDROXY (VIT D DEFICIENCY, FRACTURES): VITD: 63.03 ng/mL (ref 30.00–100.00)

## 2018-10-10 ENCOUNTER — Telehealth: Payer: Self-pay | Admitting: Family Medicine

## 2018-10-10 NOTE — Telephone Encounter (Signed)
Spoken and notified patient of Dr Hulda Humphrey comments. Patient verbalized understanding.

## 2018-10-10 NOTE — Telephone Encounter (Signed)
Please cut back to once daily if you can  Be careful with fluid intake  We will discuss and re check at upcoming appt  Thanks

## 2018-10-10 NOTE — Telephone Encounter (Signed)
See 10/08/18 lab result notes.

## 2018-10-10 NOTE — Telephone Encounter (Signed)
Pt called with wanting Dr. Glori Bickers know that she is taking a couple doses  Miralax a day and she wants to make sure that that is ok since her sodium is low and she will be taking in extra fluids. She is asking Dr. Glori Bickers to give her a call back, her # is [phone]. She has cut back on some fluids because she does  not need the Mucinex since upper respiratory congestion is better. And not taking the tessalon pearls any longer.

## 2018-10-13 ENCOUNTER — Other Ambulatory Visit: Payer: Self-pay | Admitting: *Deleted

## 2018-10-13 MED ORDER — FLUTICASONE PROPIONATE 50 MCG/ACT NA SUSP: NASAL | 5 refills | Status: DC

## 2018-10-15 ENCOUNTER — Ambulatory Visit (INDEPENDENT_AMBULATORY_CARE_PROVIDER_SITE_OTHER): Payer: Medicare Other | Admitting: Family Medicine

## 2018-10-15 ENCOUNTER — Encounter: Payer: Self-pay | Admitting: Family Medicine

## 2018-10-15 VITALS — BP 140/80 | HR 104 | Temp 98.5°F | Ht 62.0 in | Wt 116.2 lb

## 2018-10-15 DIAGNOSIS — R739 Hyperglycemia, unspecified: Secondary | ICD-10-CM

## 2018-10-15 DIAGNOSIS — I10 Essential (primary) hypertension: Secondary | ICD-10-CM | POA: Diagnosis not present

## 2018-10-15 DIAGNOSIS — M810 Age-related osteoporosis without current pathological fracture: Secondary | ICD-10-CM

## 2018-10-15 DIAGNOSIS — Z23 Encounter for immunization: Secondary | ICD-10-CM | POA: Diagnosis not present

## 2018-10-15 DIAGNOSIS — K5909 Other constipation: Secondary | ICD-10-CM

## 2018-10-15 DIAGNOSIS — Z0000 Encounter for general adult medical examination without abnormal findings: Secondary | ICD-10-CM | POA: Diagnosis not present

## 2018-10-15 DIAGNOSIS — E871 Hypo-osmolality and hyponatremia: Secondary | ICD-10-CM

## 2018-10-15 DIAGNOSIS — E2839 Other primary ovarian failure: Secondary | ICD-10-CM

## 2018-10-15 LAB — BASIC METABOLIC PANEL
BUN: 12 mg/dL (ref 6–23)
CO2: 27 mEq/L (ref 19–32)
Calcium: 9.4 mg/dL (ref 8.4–10.5)
Chloride: 96 mEq/L (ref 96–112)
Creatinine, Ser: 0.78 mg/dL (ref 0.40–1.20)
GFR: 76.7 mL/min (ref >60.00)
Glucose, Bld: 111 mg/dL — ABNORMAL HIGH (ref 70–99)
Potassium: 4.6 mEq/L (ref 3.5–5.1)
Sodium: 130 mEq/L — ABNORMAL LOW (ref 135–145)

## 2018-10-15 MED ORDER — LOSARTAN POTASSIUM 100 MG PO TABS: 100.0000 mg | ORAL_TABLET | Freq: Every day | ORAL | 3 refills | Status: DC

## 2018-10-15 MED ORDER — ALCLOMETASONE DIPROPIONATE 0.05 % EX OINT: TOPICAL_OINTMENT | Freq: Every day | CUTANEOUS | 3 refills | Status: DC | PRN

## 2018-10-15 NOTE — Patient Instructions (Addendum)
We will schedule your bone density test   Socialization is the most important part of preserving memory   Flu shot today  We will re check sodium level
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
## 2018-10-16 ENCOUNTER — Telehealth: Payer: Self-pay | Admitting: Family Medicine

## 2018-10-16 MED ORDER — LOSARTAN POTASSIUM 100 MG PO TABS: 100.0000 mg | ORAL_TABLET | Freq: Every day | ORAL | 3 refills | Status: DC

## 2018-10-16 NOTE — Telephone Encounter (Signed)
Rx resent to pharmacy

## 2018-10-16 NOTE — Telephone Encounter (Signed)
Copied from [place] [id number]. Topic: Quick Communication - Rx Refill/Question >> Oct 16, 2018 10:47 AM Carolyn Stare wrote: Medication losartan (COZAAR) 100 MG tablet     pt contacted the pharmacy and they her they did not received the RX  Has the patient contacted their pharmacy yes     Preferred [place]: Please be advised that RX refills may take up to 3 business days. We ask that you follow-up with your pharmacy.

## 2018-10-16 NOTE — Telephone Encounter (Signed)
See refill request for Cozaar.

## 2018-10-19 NOTE — Assessment & Plan Note (Signed)
Difficult to control with h/o hyponatremia and need to restrict fluids miralax helps- needs to use caution with this

## 2018-10-19 NOTE — Assessment & Plan Note (Signed)
Lab Results  Component Value Date   HGBA1C 5.2 10/08/2018   Well controlled with diet

## 2018-10-19 NOTE — Assessment & Plan Note (Signed)
Sodium of 123 on draw Since then restricting fluids  Re check today  Disc risk of seizures with current level or lower  This is chronic  Also limit use of laxatives (difficult with her chronic constipation)

## 2018-10-19 NOTE — Assessment & Plan Note (Signed)
Due for dexa -ordered No Prolia  Exercise -good  Takes ca and D No fractures (one fall)

## 2018-10-19 NOTE — Assessment & Plan Note (Signed)
bp in fair control at this time  BP Readings from Last 1 Encounters:  10/15/18 140/80   No changes needed Most recent labs reviewed  Disc lifstyle change with low sodium diet and exercise

## 2018-10-19 NOTE — Assessment & Plan Note (Signed)
Reviewed health habits including diet and exercise and skin cancer prevention Reviewed appropriate screening tests for age  Also reviewed health mt list, fam hx and immunization status , as well as social and family history   See HPI Labs reviewed  Hearing screen normal  Enc socialization for memory and cognition dexa ordered Flu shot given

## 2018-10-31 ENCOUNTER — Ambulatory Visit: Payer: Medicare Other

## 2018-11-05 ENCOUNTER — Telehealth: Payer: Self-pay | Admitting: Family Medicine

## 2018-11-05 MED ORDER — FLUTICASONE PROPIONATE 50 MCG/ACT NA SUSP: NASAL | 5 refills | Status: DC

## 2018-11-05 NOTE — Telephone Encounter (Signed)
done

## 2018-11-05 NOTE — Telephone Encounter (Signed)
Pt called office stating she transferred her medications over to [facility] on [address], [place]. The pharmacy did not get the request for the Fluticasone so the pt is requesting to have that sent. Pt states she does not use the [facility] anymore.

## 2018-11-06 ENCOUNTER — Telehealth: Payer: Self-pay | Admitting: *Deleted

## 2018-11-06 MED ORDER — FLUTICASONE PROPIONATE 50 MCG/ACT NA SUSP: NASAL | 5 refills | Status: DC

## 2018-11-06 NOTE — Telephone Encounter (Signed)
I resent it

## 2018-11-06 NOTE — Telephone Encounter (Signed)
Spoke to [employer] with [employer]. She states that have received a Rx for pts flonase but they are needing more information. In order for pts insurance to cover med, they are needing a frequency added to instruction. Insurance will cover up to 4xs per day. pls advise

## 2018-11-28 ENCOUNTER — Ambulatory Visit
Admission: RE | Admit: 2018-11-28 | Discharge: 2018-11-28 | Disposition: A | Discharge status: Home / Self Care | Payer: Medicare Other | Source: Ambulatory Visit | Attending: Family Medicine | Admitting: Family Medicine

## 2018-11-28 DIAGNOSIS — Z1231 Encounter for screening mammogram for malignant neoplasm of breast: Secondary | ICD-10-CM

## 2018-11-28 DIAGNOSIS — E2839 Other primary ovarian failure: Secondary | ICD-10-CM

## 2018-11-28 IMAGING — MG DIGITAL SCREENING BILATERAL MAMMOGRAM WITH TOMO AND CAD
8 series · 9 of 24 positions shown · non-contrast
Comparison: Previous exam(s).

CLINICAL DATA: Screening.

EXAM:
DIGITAL SCREENING BILATERAL MAMMOGRAM WITH TOMO AND CAD

[L MLO synth-2D]
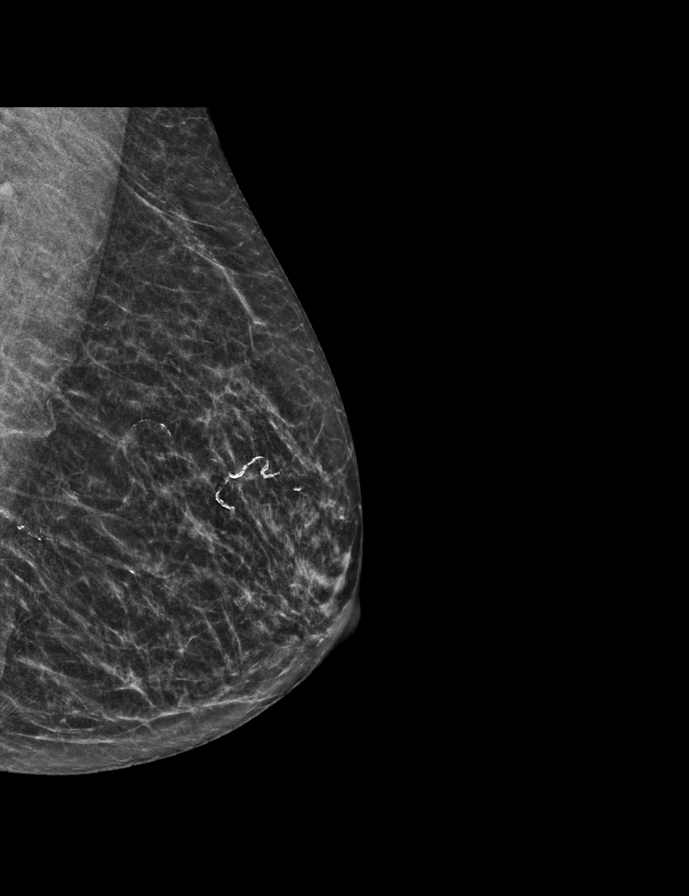

[L CC synth-2D]
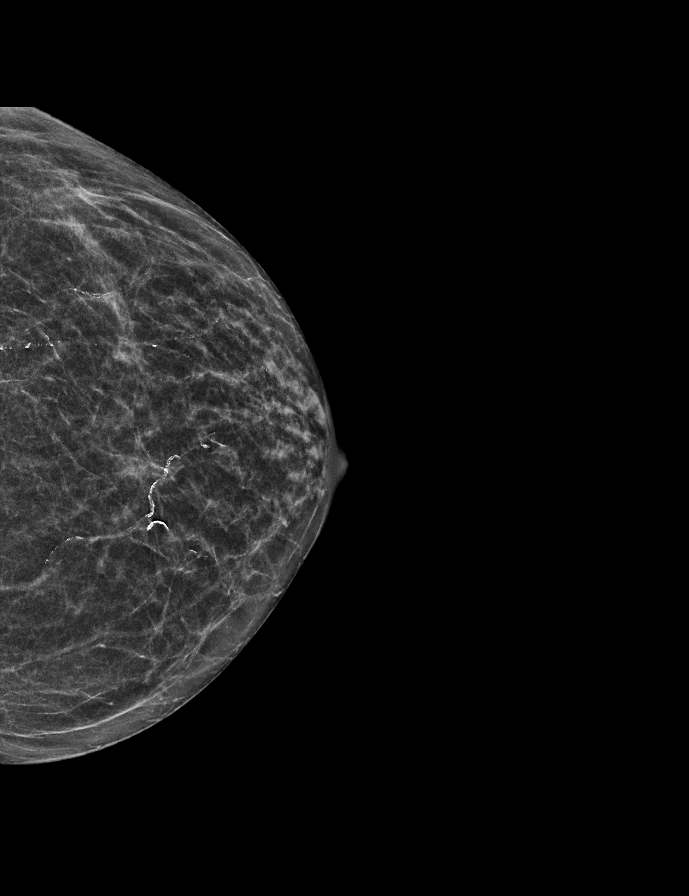

[R CC synth-2D]
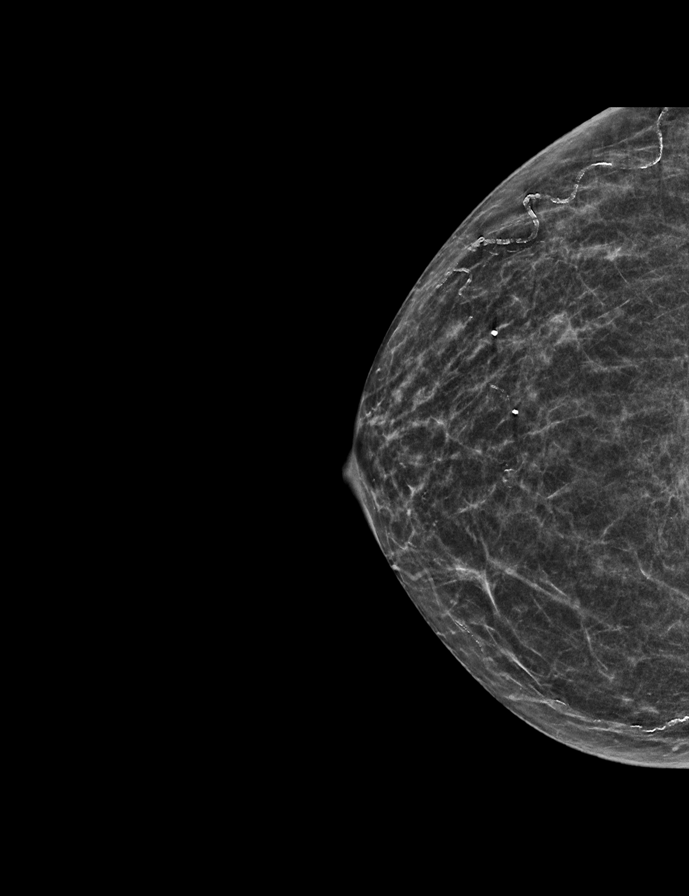

[R MLO synth-2D]
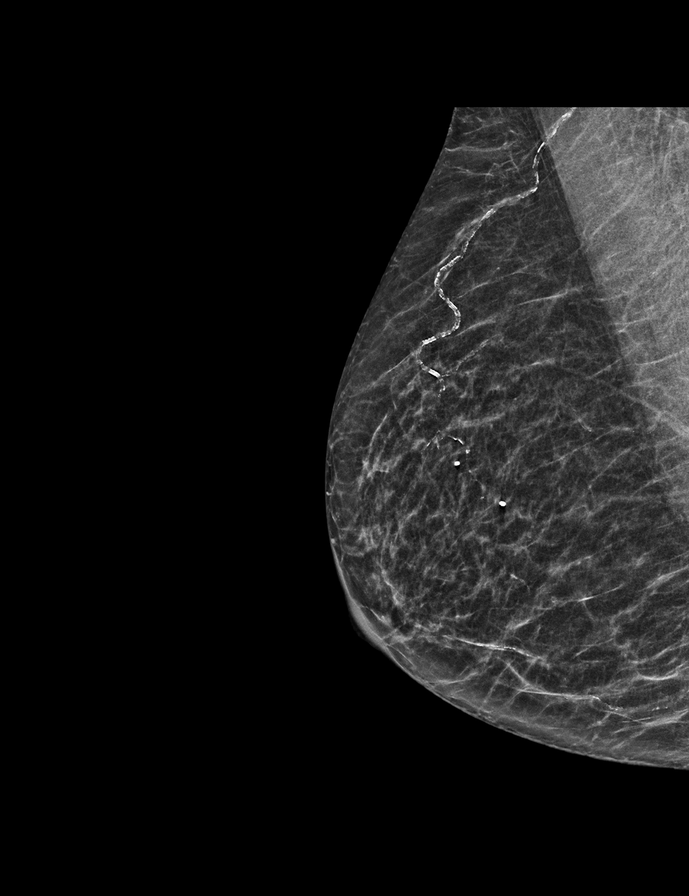

[L CC tomo · 2 of 47 frames shown]
[frame 16/47]
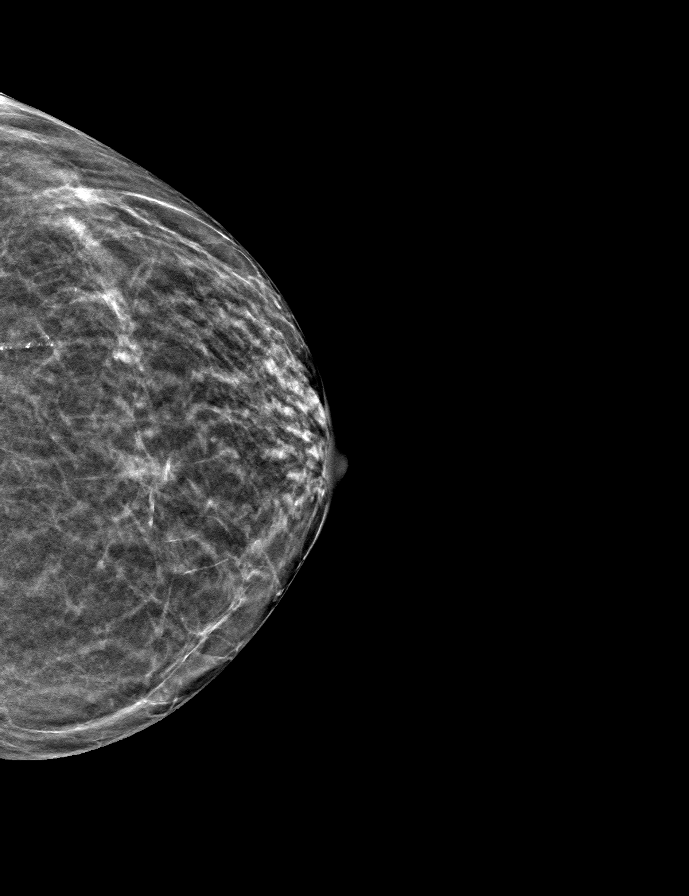
[frame 24/47]
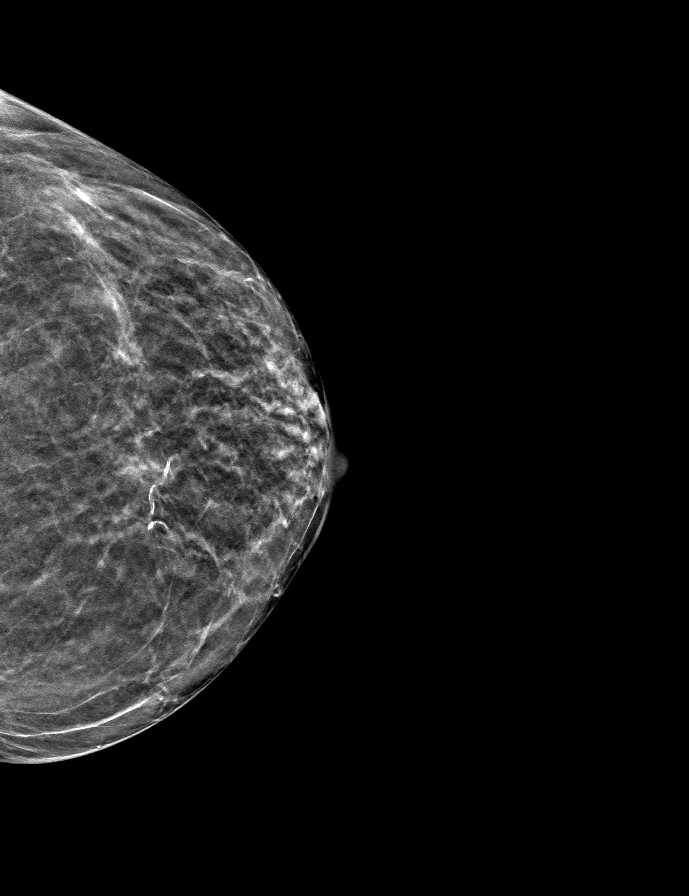

[L MLO tomo · tomo slice 25/48.0]
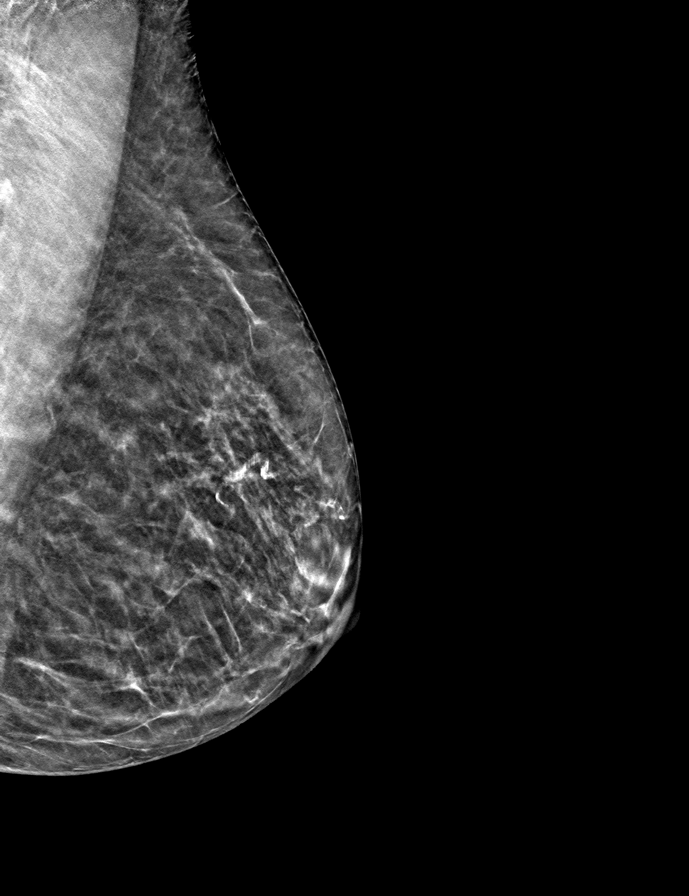

[R MLO tomo · tomo slice 23/45.0]
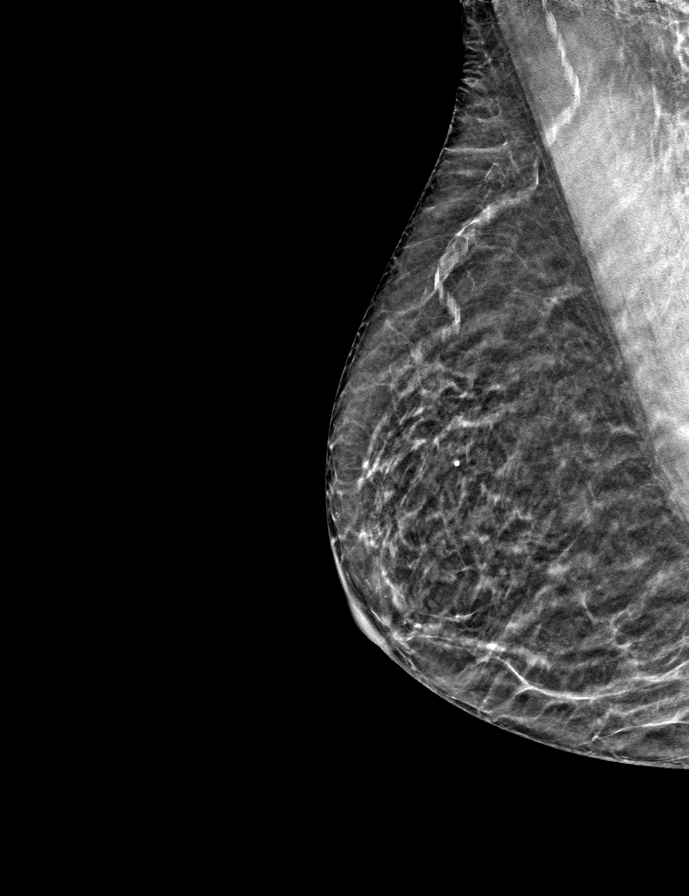

[R CC tomo · tomo slice 25/48.0]
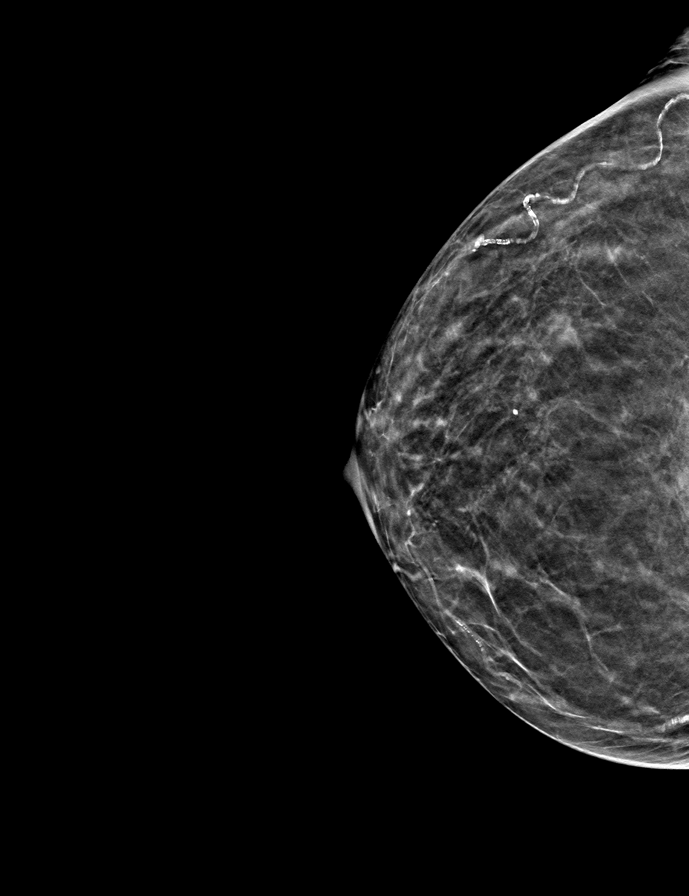

[9 of 24 positions shown; findings below may reference images not displayed]

ACR Breast Density Category b: There are scattered areas of
fibroglandular density.
FINDINGS: There are no findings suspicious for malignancy. Images were
processed with CAD.
IMPRESSION: No mammographic evidence of malignancy. A result letter of this
screening mammogram will be mailed directly to the patient.

RECOMMENDATION:
Screening mammogram in one year. (Code:[TQ])

BI-RADS CATEGORY  1: Negative.

## 2018-12-03 ENCOUNTER — Other Ambulatory Visit: Payer: Self-pay | Admitting: Family Medicine

## 2018-12-03 MED ORDER — AMLODIPINE BESYLATE 5 MG PO TABS: 5.0000 mg | ORAL_TABLET | Freq: Every day | ORAL | 3 refills | Status: DC

## 2018-12-03 NOTE — Telephone Encounter (Signed)
**  SEE PREV NOTE ALSO**  Name of Medication: Ambien Name of Pharmacy: [facility] or Written Date and Quantity:08/06/18 #30 tabs with 3 additional refills Last Office Visit and Type: CPE on 10/15/18 Next Office Visit and Type: non scheduled  Last Controlled Substance Agreement Date: 05/22/13 Last UDS: 05/22/13

## 2018-12-03 NOTE — Telephone Encounter (Signed)
Pt requesting medication refill of amlodipine. Pt states she was reduced to 5mg  and prefers to take a 5mg  tab instead of 1/2 tab of 10mg . Rx can be sent to [facility]. pls advise

## 2019-02-18 ENCOUNTER — Encounter: Payer: Self-pay | Admitting: Family Medicine

## 2019-02-18 ENCOUNTER — Ambulatory Visit (INDEPENDENT_AMBULATORY_CARE_PROVIDER_SITE_OTHER): Payer: Medicare Other | Admitting: Family Medicine

## 2019-02-18 VITALS — BP 140/80 | HR 86 | Temp 97.6°F | Ht 62.0 in | Wt 124.1 lb

## 2019-02-18 DIAGNOSIS — R140 Abdominal distension (gaseous): Secondary | ICD-10-CM

## 2019-02-18 DIAGNOSIS — E871 Hypo-osmolality and hyponatremia: Secondary | ICD-10-CM

## 2019-02-18 DIAGNOSIS — K5909 Other constipation: Secondary | ICD-10-CM

## 2019-02-18 DIAGNOSIS — I10 Essential (primary) hypertension: Secondary | ICD-10-CM | POA: Diagnosis not present

## 2019-02-18 NOTE — Assessment & Plan Note (Signed)
Ongoing bubble feeling in upper abdomen  tums and pepcid help acid Chronic and has seen GI  Pt wondered about gastroparesis- I doubt this is what she has in light of no nausea or vomiting  Disc eating smaller meals or snacks Limit fat per serving as well as avoid large amt of insoluble fiber  Continue exercise  Given info on low FODMAP eating as well (unsure if this would help)  She is a vegetarian and finds it challenging to fit in protein
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
## 2019-02-18 NOTE — Patient Instructions (Addendum)
Take care of yourself   If your bloating leads to nausea and vomiting please let me know   Take a look at the low FODMAP eating plan (unsure if it would help)   Do eat small meals or snacks  Avoid excessive fat in one serving   Keep exercising and taking good care of yourself  Yoga is excellent

## 2019-02-18 NOTE — Assessment & Plan Note (Signed)
Continues miralax prn  Last sodium level was improved at 130 (will continue to follow)

## 2019-02-18 NOTE — Assessment & Plan Note (Signed)
Rev last GI note utd colonoscopy  linzess px but that may worsen hyponatremia  She continues miralax as needed and high fiber diet

## 2019-02-18 NOTE — Assessment & Plan Note (Signed)
Some white coat component  Improved on 2nd check after sitting  bp in fair control at this time  BP Readings from Last 1 Encounters:  02/18/19 140/80   No changes needed Most recent labs reviewed  Disc lifstyle change with low sodium diet and exercise

## 2019-02-26 ENCOUNTER — Ambulatory Visit (INDEPENDENT_AMBULATORY_CARE_PROVIDER_SITE_OTHER): Payer: Medicare Other

## 2019-02-26 ENCOUNTER — Ambulatory Visit (INDEPENDENT_AMBULATORY_CARE_PROVIDER_SITE_OTHER): Payer: Medicare Other | Admitting: Orthopedic Surgery

## 2019-02-26 ENCOUNTER — Encounter (INDEPENDENT_AMBULATORY_CARE_PROVIDER_SITE_OTHER): Payer: Self-pay | Admitting: Orthopedic Surgery

## 2019-02-26 VITALS — Ht 62.0 in | Wt 124.1 lb

## 2019-02-26 DIAGNOSIS — M25572 Pain in left ankle and joints of left foot: Secondary | ICD-10-CM | POA: Diagnosis not present

## 2019-02-26 DIAGNOSIS — M79642 Pain in left hand: Secondary | ICD-10-CM

## 2019-02-27 ENCOUNTER — Telehealth: Payer: Self-pay

## 2019-02-27 NOTE — Telephone Encounter (Signed)
Prolia Benefits submitted to insurance for review, awaiting cost approval.  Once benefits identified will contact patient to review and move forward with scheduling.

## 2019-03-02 ENCOUNTER — Encounter (INDEPENDENT_AMBULATORY_CARE_PROVIDER_SITE_OTHER): Payer: Self-pay | Admitting: Orthopedic Surgery
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
## 2019-03-04 ENCOUNTER — Telehealth: Payer: Self-pay | Admitting: Family Medicine

## 2019-03-04 NOTE — Telephone Encounter (Signed)
Error not needed

## 2019-04-01 ENCOUNTER — Other Ambulatory Visit: Payer: Self-pay | Admitting: Family Medicine

## 2019-04-01 NOTE — Telephone Encounter (Signed)
Name of [place] Name of Pharmacy:[facility] Last [place] or Written Date and Quantity:12/03/18 #30 tabs with 3 additional refills Last Office Visit and Type:02/18/19 discuss GI meds Next Office Visit and Type:non scheduled  Last Controlled Substance Agreement Date:05/22/13 Last UDS:05/22/13

## 2019-04-16 ENCOUNTER — Telehealth: Payer: Self-pay

## 2019-04-16 NOTE — Telephone Encounter (Signed)
Patient has concerns about Prolia injection. Contacted patient and advised her that she will contacted in near future to discuss Prolia injection. Patient verbalized understanding.

## 2019-04-17 ENCOUNTER — Telehealth: Payer: Self-pay | Admitting: Family Medicine

## 2019-04-17 NOTE — Telephone Encounter (Signed)
patient aware of insurance coverage and injection scheduled.

## 2019-04-17 NOTE — Telephone Encounter (Signed)
Please see what her concerns are, thanks

## 2019-04-17 NOTE — Telephone Encounter (Signed)
Spoke with patient regarding her concerns.  She was due in March; however, due to the coronavirus situation her injection has been put on hold.  She is anxious to resume injections.  I informed her that I will be following up with her in the next week to get her scheduled once I return to work.  The thanked me for the update and will be anxiously awaiting my call.

## 2019-04-20 NOTE — Telephone Encounter (Signed)
Patient has been scheduled for her prolia injection for 04/21/19.  Insurance benefits reviewed and patient aware and verbalizes agreement with moving forward with injection.

## 2019-04-21 ENCOUNTER — Other Ambulatory Visit: Payer: Self-pay

## 2019-04-21 DIAGNOSIS — Q782 Osteopetrosis: Secondary | ICD-10-CM

## 2019-04-21 MED ORDER — DENOSUMAB 60 MG/ML ~~LOC~~ SOSY
60.0000 mg | PREFILLED_SYRINGE | Freq: Once | SUBCUTANEOUS | Status: AC
  Administered 2019-04-21: 60 mg via SUBCUTANEOUS

## 2019-04-21 NOTE — Progress Notes (Deleted)
error 

## 2019-07-01 ENCOUNTER — Other Ambulatory Visit: Payer: Self-pay | Admitting: Family Medicine

## 2019-07-01 NOTE — Telephone Encounter (Signed)
Patient is requesting a refill, last refill 04/02/19. Last OV 02/18/19.

## 2019-07-13 ENCOUNTER — Ambulatory Visit (INDEPENDENT_AMBULATORY_CARE_PROVIDER_SITE_OTHER): Payer: Medicare Other

## 2019-07-13 ENCOUNTER — Other Ambulatory Visit: Payer: Self-pay

## 2019-07-13 ENCOUNTER — Ambulatory Visit (INDEPENDENT_AMBULATORY_CARE_PROVIDER_SITE_OTHER): Payer: Medicare Other | Admitting: Orthopedic Surgery

## 2019-07-13 ENCOUNTER — Encounter: Payer: Self-pay | Admitting: Orthopedic Surgery

## 2019-07-13 VITALS — Ht 62.0 in | Wt 124.0 lb

## 2019-07-13 DIAGNOSIS — M7672 Peroneal tendinitis, left leg: Secondary | ICD-10-CM | POA: Diagnosis not present

## 2019-07-13 DIAGNOSIS — M25572 Pain in left ankle and joints of left foot: Secondary | ICD-10-CM

## 2019-07-18 ENCOUNTER — Encounter: Payer: Self-pay | Admitting: Orthopedic Surgery
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
## 2019-07-30 ENCOUNTER — Other Ambulatory Visit: Payer: Self-pay | Admitting: Family Medicine

## 2019-07-30 NOTE — Telephone Encounter (Signed)
Name of Medication: Ambien Name of Pharmacy: [facility] or Written Date and Quantity: 07/01/19 #30 tabs with 0 refills Last Office Visit and Type: GI issues on 02/18/19 Next Office Visit and Type: none scheduled

## 2019-09-15 ENCOUNTER — Telehealth: Payer: Self-pay | Admitting: Family Medicine

## 2019-09-15 DIAGNOSIS — M810 Age-related osteoporosis without current pathological fracture: Secondary | ICD-10-CM

## 2019-09-15 NOTE — Telephone Encounter (Signed)
Prolia benefits submitted to [employer]. 

## 2019-09-15 NOTE — Telephone Encounter (Signed)
Pt due for Prolia injection on or after 10-22-2019.  Pt will need calcium lab before this injection.

## 2019-09-15 NOTE — Telephone Encounter (Signed)
I put the order in

## 2019-09-25 ENCOUNTER — Ambulatory Visit (INDEPENDENT_AMBULATORY_CARE_PROVIDER_SITE_OTHER): Payer: Medicare Other

## 2019-09-25 DIAGNOSIS — Z23 Encounter for immunization: Secondary | ICD-10-CM

## 2019-10-08 ENCOUNTER — Telehealth: Payer: Self-pay

## 2019-10-08 NOTE — Telephone Encounter (Signed)
LVM to call clinic, needs COVID screen, back lab and front door info

## 2019-10-11 ENCOUNTER — Telehealth: Payer: Self-pay | Admitting: Family Medicine

## 2019-10-11 DIAGNOSIS — M810 Age-related osteoporosis without current pathological fracture: Secondary | ICD-10-CM

## 2019-10-11 DIAGNOSIS — I10 Essential (primary) hypertension: Secondary | ICD-10-CM

## 2019-10-11 DIAGNOSIS — R739 Hyperglycemia, unspecified: Secondary | ICD-10-CM

## 2019-10-11 NOTE — Telephone Encounter (Signed)
-----   Message from Ellamae Sia sent at 10/05/2019  3:41 PM EDT ----- Regarding: Lab orders for Monday, 10.19.20 Patient is scheduled for CPX labs, please order future labs, Thanks , Karna Christmas

## 2019-10-12 ENCOUNTER — Telehealth: Payer: Self-pay | Admitting: Family Medicine

## 2019-10-12 ENCOUNTER — Other Ambulatory Visit: Payer: Self-pay

## 2019-10-12 ENCOUNTER — Other Ambulatory Visit (INDEPENDENT_AMBULATORY_CARE_PROVIDER_SITE_OTHER): Payer: Medicare Other

## 2019-10-12 DIAGNOSIS — I10 Essential (primary) hypertension: Secondary | ICD-10-CM | POA: Diagnosis not present

## 2019-10-12 DIAGNOSIS — R739 Hyperglycemia, unspecified: Secondary | ICD-10-CM

## 2019-10-12 DIAGNOSIS — M810 Age-related osteoporosis without current pathological fracture: Secondary | ICD-10-CM

## 2019-10-12 LAB — CBC WITH DIFFERENTIAL/PLATELET
Basophils Absolute: 0 10*3/uL (ref 0.0–0.1)
Basophils Relative: 0.8 % (ref 0.0–3.0)
Eosinophils Absolute: 0.2 10*3/uL (ref 0.0–0.7)
Eosinophils Relative: 3.5 % (ref 0.0–5.0)
HCT: 38.5 % (ref 36.0–46.0)
Hemoglobin: 12.8 g/dL (ref 12.0–15.0)
Lymphocytes Relative: 26.4 % (ref 12.0–46.0)
Lymphs Abs: 1.4 10*3/uL (ref 0.7–4.0)
MCHC: 33.2 g/dL (ref 30.0–36.0)
MCV: 98.1 fl (ref 78.0–100.0)
Monocytes Absolute: 0.6 10*3/uL (ref 0.1–1.0)
Monocytes Relative: 12.1 % — ABNORMAL HIGH (ref 3.0–12.0)
Neutro Abs: 3 10*3/uL (ref 1.4–7.7)
Neutrophils Relative %: 57.2 % (ref 43.0–77.0)
Platelets: 391 10*3/uL (ref 150.0–400.0)
RBC: 3.93 Mil/uL (ref 3.87–5.11)
RDW: 12.8 % (ref 11.5–15.5)
WBC: 5.2 10*3/uL (ref 4.0–10.5)

## 2019-10-12 LAB — COMPREHENSIVE METABOLIC PANEL
ALT: 11 U/L (ref 0–35)
AST: 23 U/L (ref 0–37)
Albumin: 3.7 g/dL (ref 3.5–5.2)
Alkaline Phosphatase: 58 U/L (ref 39–117)
BUN: 9 mg/dL (ref 6–23)
CO2: 27 mEq/L (ref 19–32)
Calcium: 9.4 mg/dL (ref 8.4–10.5)
Chloride: 90 mEq/L — ABNORMAL LOW (ref 96–112)
Creatinine, Ser: 0.8 mg/dL (ref 0.40–1.20)
GFR: 69.9 mL/min (ref >60.00)
Glucose, Bld: 106 mg/dL — ABNORMAL HIGH (ref 70–99)
Potassium: 4.4 mEq/L (ref 3.5–5.1)
Sodium: 124 mEq/L — ABNORMAL LOW (ref 135–145)
Total Bilirubin: 0.7 mg/dL (ref 0.2–1.2)
Total Protein: 6.2 g/dL (ref 6.0–8.3)

## 2019-10-12 LAB — HEMOGLOBIN A1C: Hgb A1c MFr Bld: 5.5 % (ref 4.6–6.5)

## 2019-10-12 LAB — LIPID PANEL
Cholesterol: 231 mg/dL — ABNORMAL HIGH (ref 0–200)
HDL: 90.1 mg/dL (ref >39.00)
LDL Cholesterol: 121 mg/dL — ABNORMAL HIGH (ref 0–99)
NonHDL: 141.31
Total CHOL/HDL Ratio: 3
Triglycerides: 102 mg/dL (ref 0.0–149.0)
VLDL: 20.4 mg/dL (ref 0.0–40.0)

## 2019-10-12 LAB — VITAMIN D 25 HYDROXY (VIT D DEFICIENCY, FRACTURES): VITD: 46.78 ng/mL (ref 30.00–100.00)

## 2019-10-12 LAB — TSH: TSH: 3.66 u[IU]/mL (ref 0.35–4.50)

## 2019-10-12 NOTE — Telephone Encounter (Signed)
Discussed Prolia benefits, pt's calcium level WNL, and pt scheduled for next injection.

## 2019-10-12 NOTE — Telephone Encounter (Signed)
LMOM on both pt's phone numbers.  Pt is not due for next Prolia injection until 10-22-2019.  She will need a calcium level before can schedule.  Pt is currently scheduled for lab on 10-22-2019.  Will discuss benefits at time of scheduling.

## 2019-10-12 NOTE — Telephone Encounter (Signed)
Patient called because she was due for her Prolia shot on 10/03/19. Patient wants to know if Prolia has been authorized.  Please call patient.

## 2019-10-19 ENCOUNTER — Encounter: Payer: Self-pay | Admitting: Family Medicine

## 2019-10-19 ENCOUNTER — Ambulatory Visit (INDEPENDENT_AMBULATORY_CARE_PROVIDER_SITE_OTHER): Payer: Medicare Other | Admitting: Family Medicine

## 2019-10-19 ENCOUNTER — Other Ambulatory Visit: Payer: Self-pay

## 2019-10-19 VITALS — BP 140/80 | HR 96 | Temp 97.6°F | Ht 62.0 in | Wt 126.5 lb

## 2019-10-19 DIAGNOSIS — I10 Essential (primary) hypertension: Secondary | ICD-10-CM

## 2019-10-19 DIAGNOSIS — E871 Hypo-osmolality and hyponatremia: Secondary | ICD-10-CM | POA: Diagnosis not present

## 2019-10-19 DIAGNOSIS — Z0000 Encounter for general adult medical examination without abnormal findings: Secondary | ICD-10-CM

## 2019-10-19 DIAGNOSIS — R739 Hyperglycemia, unspecified: Secondary | ICD-10-CM | POA: Diagnosis not present

## 2019-10-19 DIAGNOSIS — K5909 Other constipation: Secondary | ICD-10-CM

## 2019-10-19 DIAGNOSIS — M810 Age-related osteoporosis without current pathological fracture: Secondary | ICD-10-CM

## 2019-10-19 MED ORDER — LOSARTAN POTASSIUM 100 MG PO TABS: 100.0000 mg | ORAL_TABLET | Freq: Every day | ORAL | 3 refills | Status: DC

## 2019-10-19 MED ORDER — AMLODIPINE BESYLATE 5 MG PO TABS: 5.0000 mg | ORAL_TABLET | Freq: Every day | ORAL | 3 refills | Status: DC

## 2019-10-19 NOTE — Assessment & Plan Note (Signed)
Still struggling  Laxative use worsens chronic constipation  Plans to f/u with GI

## 2019-10-19 NOTE — Assessment & Plan Note (Signed)
Reviewed health habits including diet and exercise and skin cancer prevention Reviewed appropriate screening tests for age  Also reviewed health mt list, fam hx and immunization status , as well as social and family history   See HPI Labs reviewed  Pt will plan mammogram for December  Not interested in shingrix Rev dexa- still on prolia Disc fall prevention (one fall) Enc to continue yoga  Adv directive is utd Good hearing- will need to work on L ear cerumen however utd eye care  LDL cholesterol is up- reviewed plan for diet

## 2019-10-19 NOTE — Assessment & Plan Note (Signed)
Lab Results  Component Value Date   HGBA1C 5.5 10/12/2019   disc imp of low glycemic diet and wt loss to prevent DM2

## 2019-10-19 NOTE — Assessment & Plan Note (Signed)
bp in fair control at this time  BP Readings from Last 1 Encounters:  10/19/19 140/80   No changes needed Most recent labs reviewed  Disc lifstyle change with low sodium diet and exercise  Pt does not like amlodipine (due to constipation) but cannot take diuretics due to low na

## 2019-10-19 NOTE — Assessment & Plan Note (Signed)
Pt slowed down fluid intake Re check today
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
## 2019-10-19 NOTE — Assessment & Plan Note (Signed)
dexa 12/19  Continues prolia  Vit D level is tx Good exercise  One fall (disc fall prev) and no new fractures

## 2019-10-19 NOTE — Patient Instructions (Addendum)
If you are interested in the new shingles vaccine (Shingrix) - call your local pharmacy to check on coverage and availability  If affordable, get on a wait list at your pharmacy to get the vaccine.   Avoid red meat/ fried foods/ egg yolks/ fatty breakfast meats/ butter, cheese and high fat dairy/ and shellfish    Stick to low cholesterol proteins like beans (not green beans), tofu and soy , fish with fins , nuts   Labs today for sodium

## 2019-10-20 ENCOUNTER — Telehealth: Payer: Self-pay

## 2019-10-20 ENCOUNTER — Other Ambulatory Visit: Payer: Self-pay | Admitting: Family Medicine

## 2019-10-20 ENCOUNTER — Telehealth: Payer: Self-pay | Admitting: Gastroenterology

## 2019-10-20 DIAGNOSIS — Z1231 Encounter for screening mammogram for malignant neoplasm of breast: Secondary | ICD-10-CM

## 2019-10-20 LAB — BASIC METABOLIC PANEL
BUN: 12 mg/dL (ref 6–23)
CO2: 28 mEq/L (ref 19–32)
Calcium: 10.3 mg/dL (ref 8.4–10.5)
Chloride: 89 mEq/L — ABNORMAL LOW (ref 96–112)
Creatinine, Ser: 0.7 mg/dL (ref 0.40–1.20)
GFR: 81.54 mL/min (ref >60.00)
Glucose, Bld: 108 mg/dL — ABNORMAL HIGH (ref 70–99)
Potassium: 4.7 mEq/L (ref 3.5–5.1)
Sodium: 126 mEq/L — ABNORMAL LOW (ref 135–145)

## 2019-10-20 NOTE — Telephone Encounter (Signed)
Hi Dr. Silverio Decamp, this patient would like to switch her care over to Dr. Tarri Glenn. She stated that you were very nice. However, she felt that her medical history was not reviewed to her satisfaction. Are you ok with the switch? Thank you.

## 2019-10-20 NOTE — Telephone Encounter (Signed)
Sure

## 2019-10-20 NOTE — Telephone Encounter (Signed)
Pt said she had previously requested to have my chart stopped; pt has difficulty getting into my chart and she received a notice there was a letter in my chart on 10/15/19. In pts letter tab looks like was from [facility] and pt said she has already gotten that letter and is going to call the breast center for mammogram appt. Per carries note my chart wa deactivated on 10/12/19. If pt gets another notice on mychart she will call [facility] to see what else can do to deactivate chart. Nothing further needed at this time.

## 2019-10-21 NOTE — Telephone Encounter (Signed)
Ov scheduled on 12/8.

## 2019-10-21 NOTE — Telephone Encounter (Signed)
Hi Dr. Tarri Glenn, please see the messages below and advise if you would accept patient. Thank you.

## 2019-10-21 NOTE — Telephone Encounter (Signed)
OK 

## 2019-10-28 ENCOUNTER — Ambulatory Visit (INDEPENDENT_AMBULATORY_CARE_PROVIDER_SITE_OTHER): Payer: Medicare Other

## 2019-10-28 DIAGNOSIS — M810 Age-related osteoporosis without current pathological fracture: Secondary | ICD-10-CM | POA: Diagnosis not present

## 2019-10-28 MED ORDER — DENOSUMAB 60 MG/ML ~~LOC~~ SOSY
60.0000 mg | PREFILLED_SYRINGE | Freq: Once | SUBCUTANEOUS | Status: AC
  Administered 2019-10-28: 09:00:00 60 mg via SUBCUTANEOUS

## 2019-10-28 NOTE — Progress Notes (Signed)
Per orders of Dr. Loura Pardon, injection of Prolia Right arm Sub Q given by Lurlean Nanny.  Patient tolerated injection well.

## 2019-11-02 ENCOUNTER — Other Ambulatory Visit: Payer: Self-pay

## 2019-11-02 ENCOUNTER — Ambulatory Visit (INDEPENDENT_AMBULATORY_CARE_PROVIDER_SITE_OTHER): Payer: Medicare Other | Admitting: Orthopaedic Surgery

## 2019-11-02 ENCOUNTER — Ambulatory Visit (INDEPENDENT_AMBULATORY_CARE_PROVIDER_SITE_OTHER): Payer: Medicare Other

## 2019-11-02 ENCOUNTER — Encounter: Payer: Self-pay | Admitting: Orthopaedic Surgery

## 2019-11-02 DIAGNOSIS — M25552 Pain in left hip: Secondary | ICD-10-CM | POA: Diagnosis not present

## 2019-11-02 DIAGNOSIS — G8929 Other chronic pain: Secondary | ICD-10-CM

## 2019-11-02 DIAGNOSIS — M25562 Pain in left knee: Secondary | ICD-10-CM
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
## 2019-11-10 ENCOUNTER — Telehealth: Payer: Self-pay | Admitting: Family Medicine

## 2019-11-10 DIAGNOSIS — E871 Hypo-osmolality and hyponatremia: Secondary | ICD-10-CM

## 2019-11-10 NOTE — Telephone Encounter (Signed)
-----   Message from Ellamae Sia sent at 11/03/2019 12:15 PM EST ----- Regarding: lab orders for Wednesday, 11.18.20 Lab orders

## 2019-11-11 ENCOUNTER — Other Ambulatory Visit (INDEPENDENT_AMBULATORY_CARE_PROVIDER_SITE_OTHER): Payer: Medicare Other

## 2019-11-11 DIAGNOSIS — E871 Hypo-osmolality and hyponatremia: Secondary | ICD-10-CM | POA: Diagnosis not present

## 2019-11-11 LAB — BASIC METABOLIC PANEL
BUN: 10 mg/dL (ref 6–23)
CO2: 28 mEq/L (ref 19–32)
Calcium: 10.4 mg/dL (ref 8.4–10.5)
Chloride: 94 mEq/L — ABNORMAL LOW (ref 96–112)
Creatinine, Ser: 0.79 mg/dL (ref 0.40–1.20)
GFR: 70.9 mL/min (ref >60.00)
Glucose, Bld: 134 mg/dL — ABNORMAL HIGH (ref 70–99)
Potassium: 4 mEq/L (ref 3.5–5.1)
Sodium: 128 mEq/L — ABNORMAL LOW (ref 135–145)

## 2019-11-27 ENCOUNTER — Other Ambulatory Visit: Payer: Self-pay | Admitting: Family Medicine

## 2019-11-30 NOTE — Telephone Encounter (Signed)
Name of Medication: Ambien Name of Pharmacy: [facility] Date and Quantity: 07/30/19 #30 tabs with 3 refills Last Office Visit and Type: CPE on 10/19/19 Next Office Visit and Type: CPE on 10/19/20

## 2019-12-01 ENCOUNTER — Ambulatory Visit: Payer: Medicare Other | Admitting: Gastroenterology

## 2019-12-02 ENCOUNTER — Ambulatory Visit (INDEPENDENT_AMBULATORY_CARE_PROVIDER_SITE_OTHER): Payer: Medicare Other | Admitting: Gastroenterology

## 2019-12-02 ENCOUNTER — Other Ambulatory Visit: Payer: Self-pay

## 2019-12-02 ENCOUNTER — Telehealth: Payer: Self-pay | Admitting: Gastroenterology

## 2019-12-02 ENCOUNTER — Encounter: Payer: Self-pay | Admitting: Gastroenterology

## 2019-12-02 VITALS — BP 144/76 | HR 98 | Temp 97.5°F | Ht 62.0 in | Wt 128.2 lb

## 2019-12-02 DIAGNOSIS — R140 Abdominal distension (gaseous): Secondary | ICD-10-CM

## 2019-12-02 DIAGNOSIS — K5909 Other constipation: Secondary | ICD-10-CM | POA: Diagnosis not present
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
## 2019-12-02 NOTE — Telephone Encounter (Signed)
Dr. Tarri Glenn, I am unsure of if the patient spoke with you about this at her appointment. She is concerned about worsening her hyponatremia by drinking 64 oz water daily. Please advise.

## 2019-12-02 NOTE — Telephone Encounter (Signed)
Patient notified to follow the fluid restriction recommendations of her PCP. Verbalized understanding.

## 2019-12-02 NOTE — Telephone Encounter (Signed)
I agree with the patient. She should follow fluid recommendations from her providers evaluation and treating her hyponatremia.

## 2019-12-02 NOTE — Patient Instructions (Addendum)
We have given you a sitz marker pill today, you need to come back in 5 days for an abdominal xray. Go to the basement level on 12-07-2019. You do not need to have an appointment.   Please try Citrucel 1 tablespoon twice daily for control of your constipation.   You will be due for a recall colonoscopy in 02/2020. We will send you a reminder in the mail when it gets closer to that time.

## 2019-12-07 ENCOUNTER — Ambulatory Visit (INDEPENDENT_AMBULATORY_CARE_PROVIDER_SITE_OTHER)
Admission: RE | Admit: 2019-12-07 | Discharge: 2019-12-07 | Disposition: A | Discharge status: Home / Self Care | Payer: Medicare Other | Source: Ambulatory Visit | Attending: Gastroenterology | Admitting: Gastroenterology

## 2019-12-07 ENCOUNTER — Other Ambulatory Visit: Payer: Self-pay | Admitting: *Deleted

## 2019-12-07 ENCOUNTER — Other Ambulatory Visit: Payer: Self-pay

## 2019-12-07 ENCOUNTER — Other Ambulatory Visit: Payer: Self-pay | Admitting: Family Medicine

## 2019-12-07 DIAGNOSIS — K5909 Other constipation: Secondary | ICD-10-CM

## 2019-12-07 IMAGING — DX DG ABDOMEN 1V
1 series · 1 of 1 positions shown · non-contrast
Comparison: [DATE]

CLINICAL DATA: Sitz marker study. Swallowed Sitz marker pill 5 days
ago.

EXAM:
ABDOMEN - 1 VIEW

[abdomen kub]
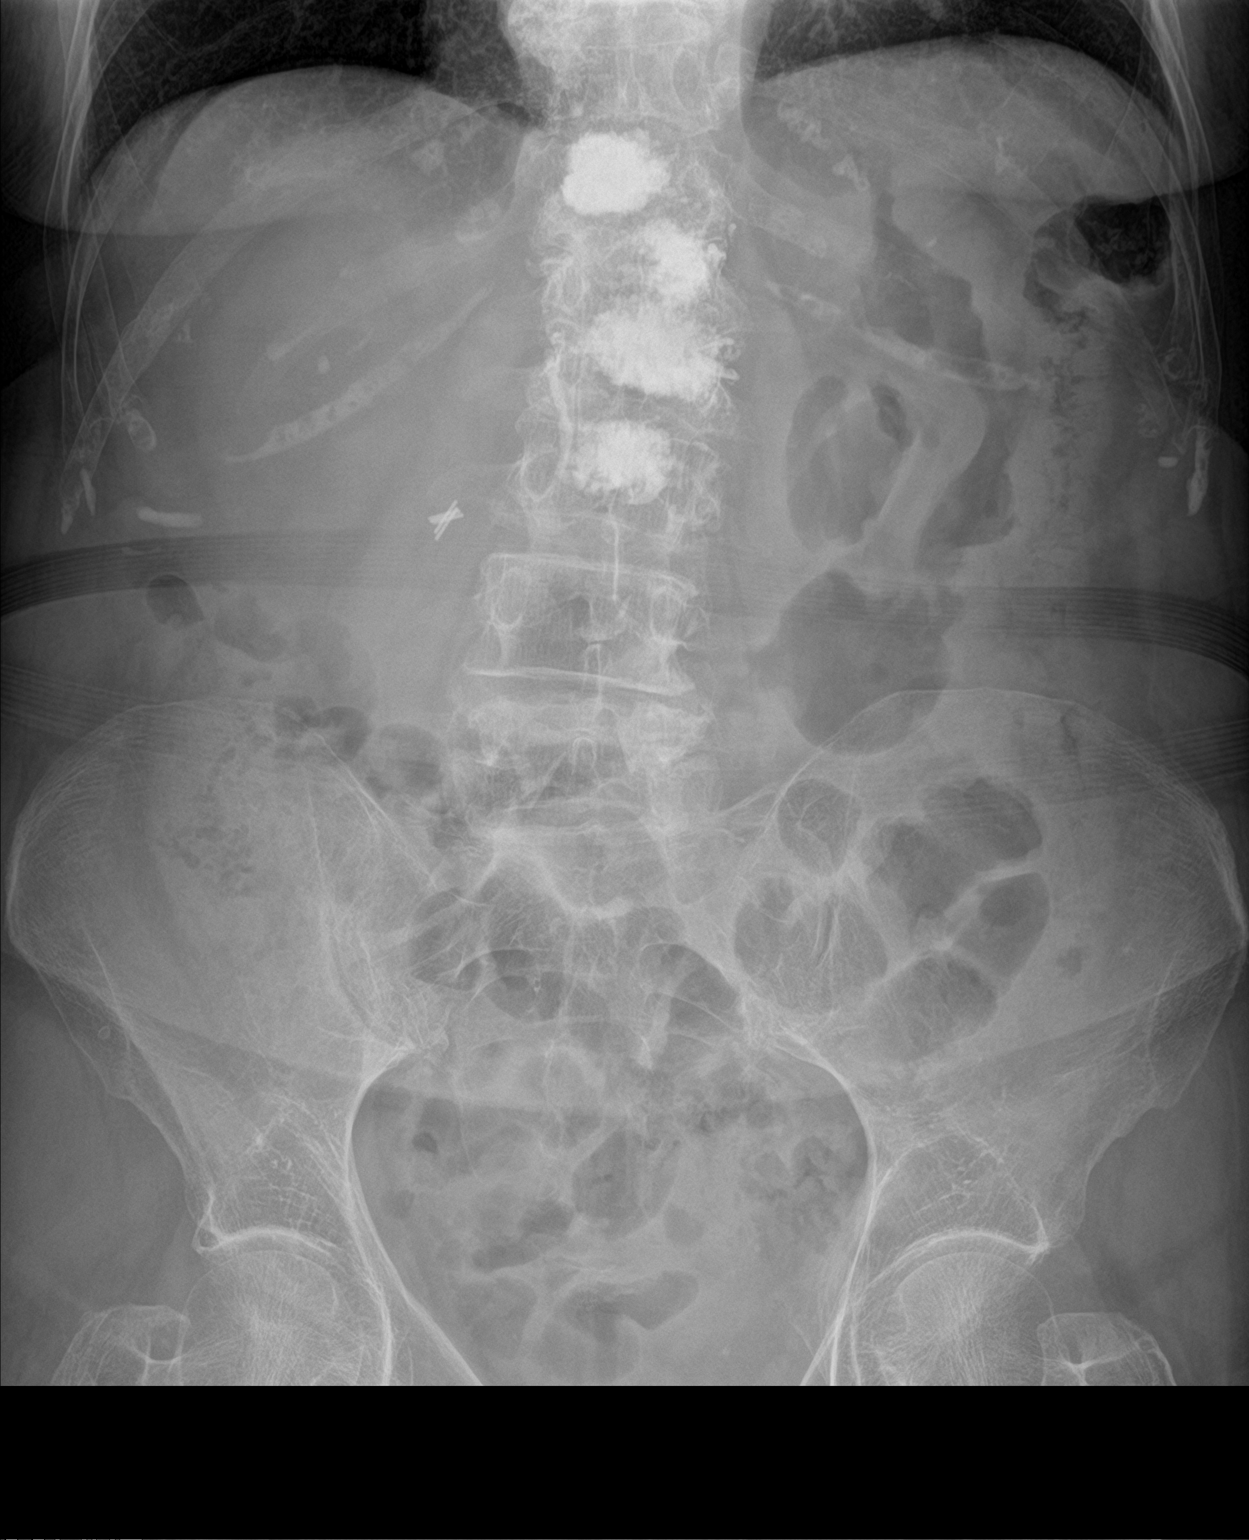

[1 of 1 positions shown; findings below may reference images not displayed]

FINDINGS: No Sitz markers are present in the abdomen or pelvis. Prior
cholecystectomy. Nonobstructive bowel gas pattern. No organomegaly
or free air. No excessive stool.
IMPRESSION: Negative for retained Sitz markers.  No acute findings.

## 2019-12-07 NOTE — Telephone Encounter (Signed)
Ointment last filled on 10/15/18 #30g with 3 refills, CPE scheduled 10/19/20

## 2019-12-10 ENCOUNTER — Ambulatory Visit
Admission: RE | Admit: 2019-12-10 | Discharge: 2019-12-10 | Disposition: A | Discharge status: Home / Self Care | Payer: Medicare Other | Source: Ambulatory Visit | Attending: Family Medicine | Admitting: Family Medicine

## 2019-12-10 ENCOUNTER — Other Ambulatory Visit: Payer: Self-pay

## 2019-12-10 DIAGNOSIS — Z1231 Encounter for screening mammogram for malignant neoplasm of breast: Secondary | ICD-10-CM

## 2019-12-10 LAB — HM MAMMOGRAPHY

## 2019-12-10 IMAGING — MG DIGITAL SCREENING BILAT W/ TOMO W/ CAD
8 series · 8 of 24 positions shown · non-contrast
Comparison: Previous exam(s).

CLINICAL DATA: Screening.

EXAM:
DIGITAL SCREENING BILATERAL MAMMOGRAM WITH TOMO AND CAD

[R MLO synth-2D]
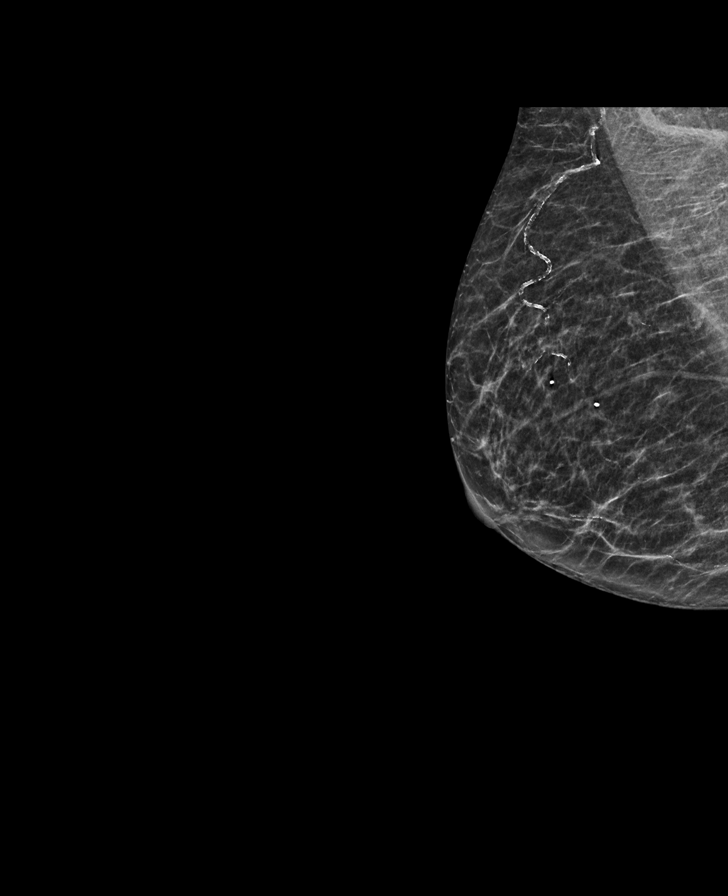

[R CC synth-2D]
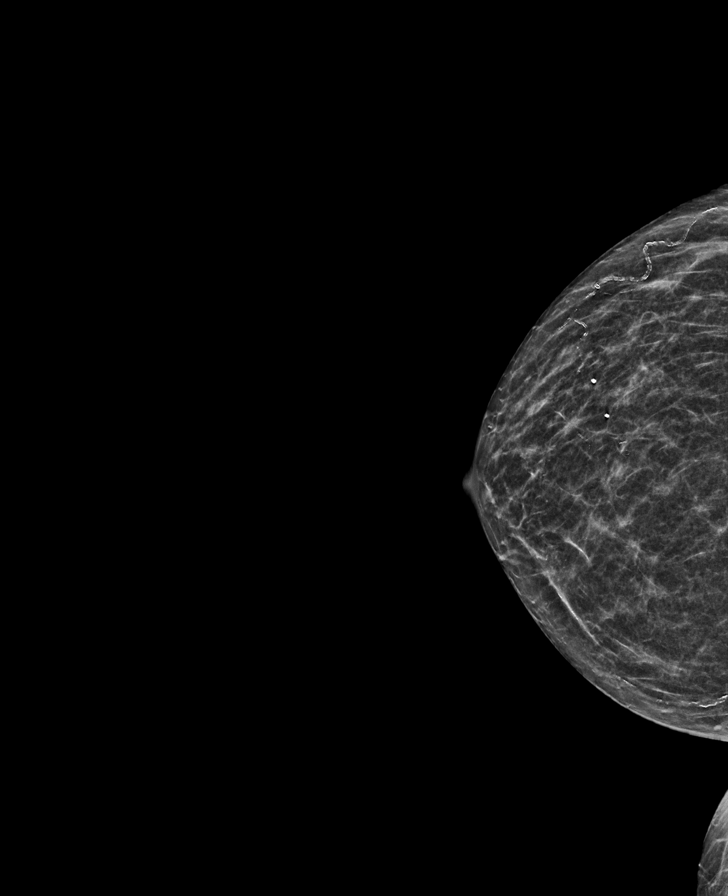

[L CC synth-2D]
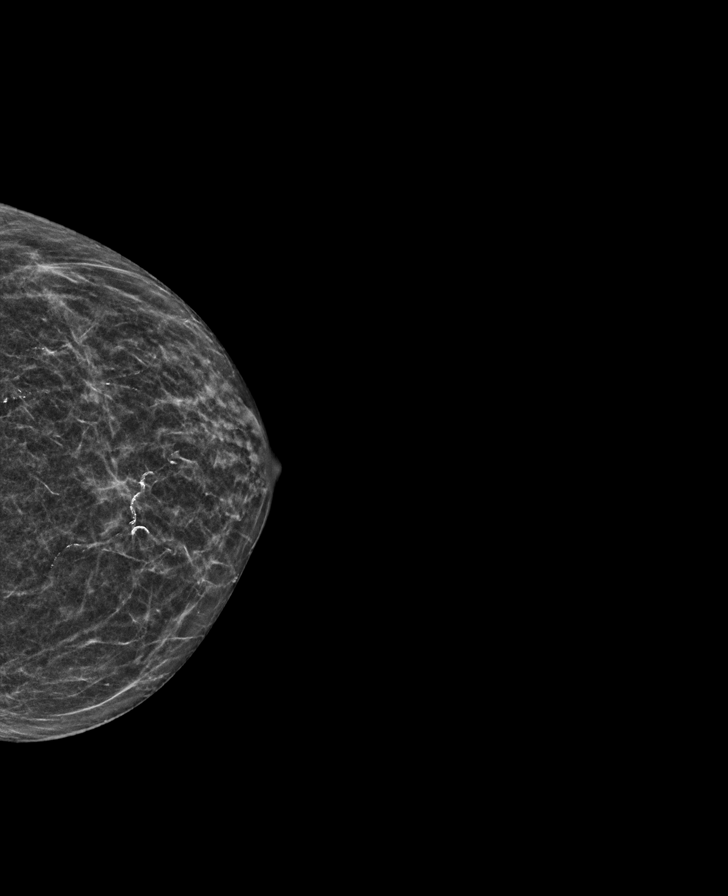

[L MLO synth-2D]
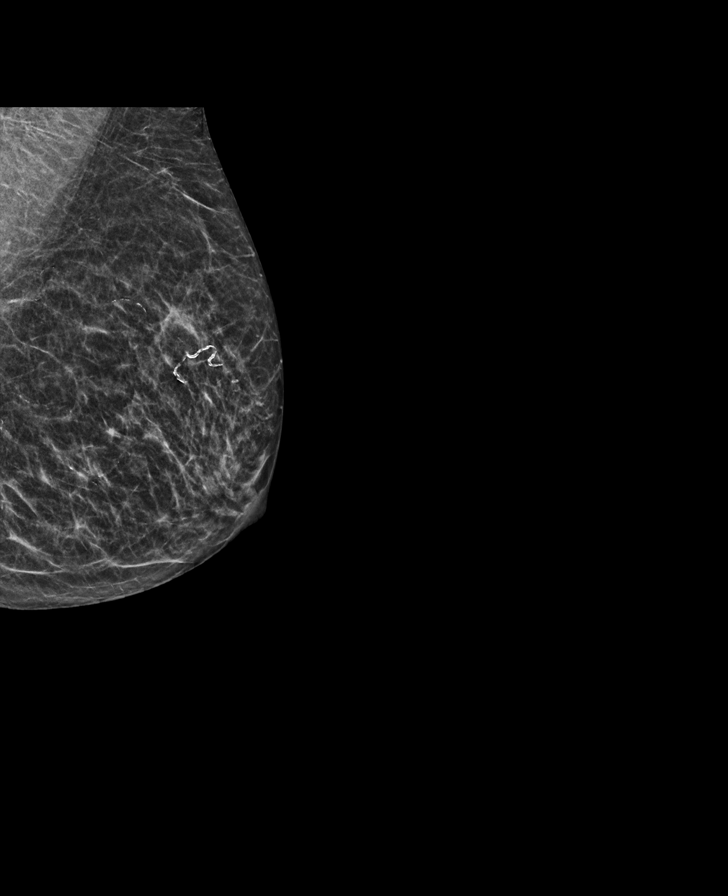

[R CC tomo · tomo slice 24/47.0]
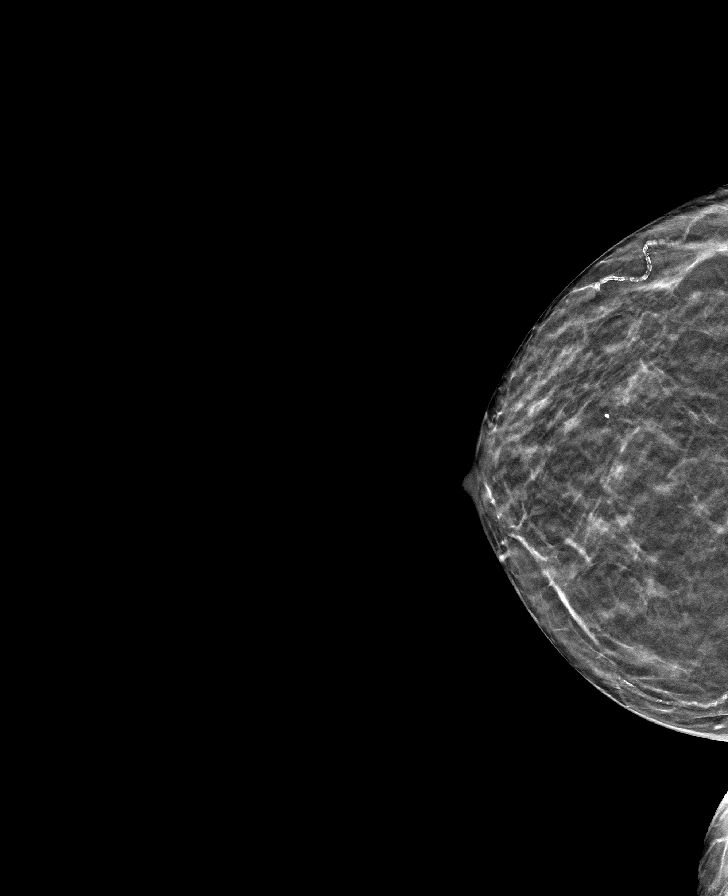

[R MLO tomo · tomo slice 25/49.0]
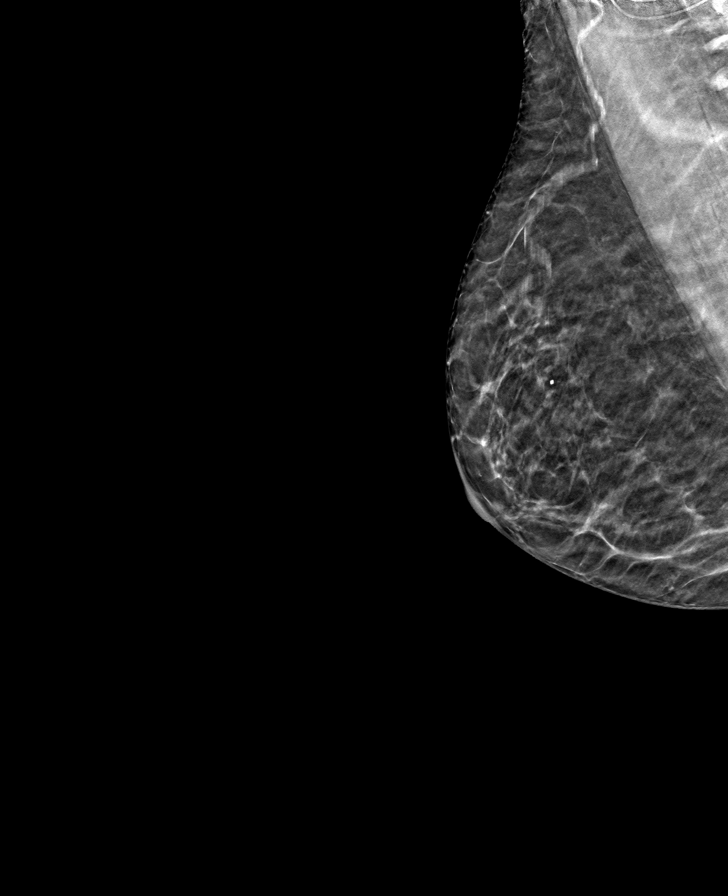

[L MLO tomo · tomo slice 25/48.0]
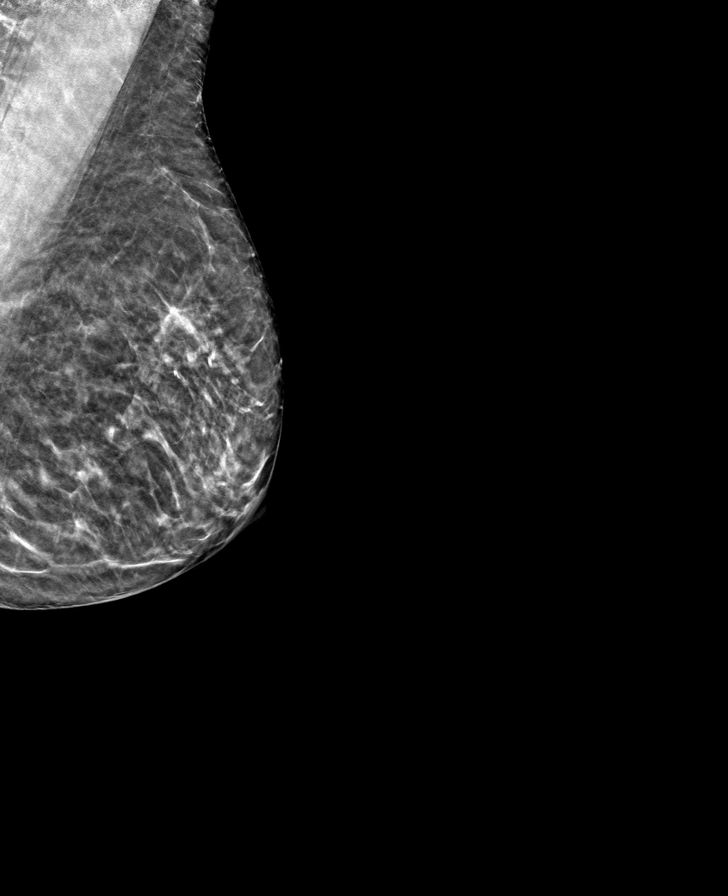

[L CC tomo · tomo slice 25/48.0]
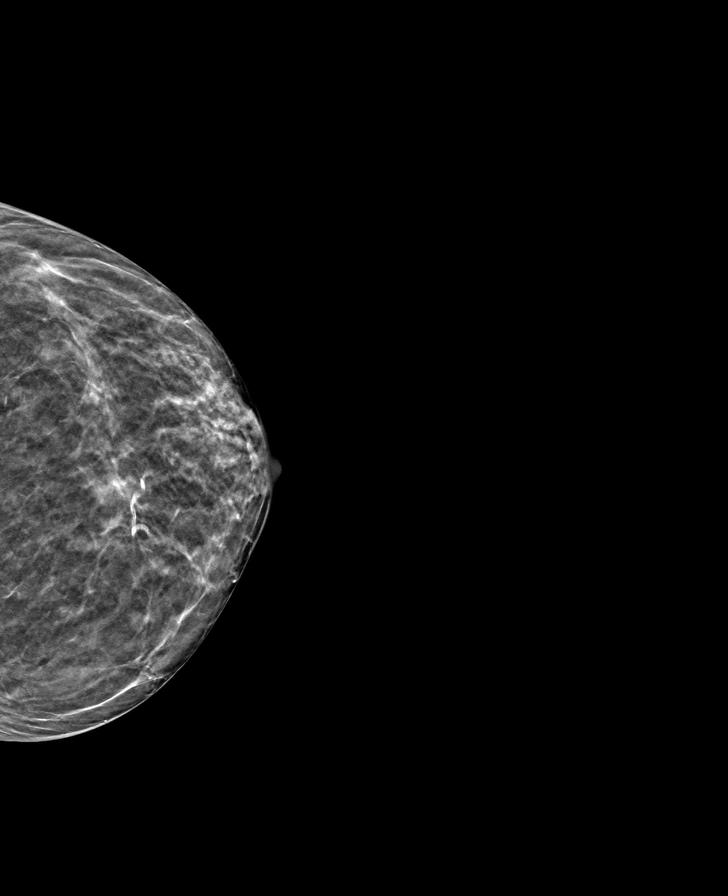

[8 of 24 positions shown; findings below may reference images not displayed]

ACR Breast Density Category b: There are scattered areas of
fibroglandular density.
FINDINGS: There are no findings suspicious for malignancy. Images were
processed with CAD.
IMPRESSION: No mammographic evidence of malignancy. A result letter of this
screening mammogram will be mailed directly to the patient.

RECOMMENDATION:
Screening mammogram in one year. (Code:[TQ])

BI-RADS CATEGORY  1: Negative.

## 2019-12-11 ENCOUNTER — Encounter: Payer: Self-pay | Admitting: Family Medicine

## 2020-01-07 ENCOUNTER — Ambulatory Visit: Payer: Medicare Other | Attending: Internal Medicine

## 2020-01-07 DIAGNOSIS — Z20822 Contact with and (suspected) exposure to covid-19: Secondary | ICD-10-CM

## 2020-01-08 LAB — NOVEL CORONAVIRUS, NAA: SARS-CoV-2, NAA: NOT DETECTED

## 2020-01-09 ENCOUNTER — Ambulatory Visit: Payer: Medicare Other | Attending: Internal Medicine

## 2020-01-09 DIAGNOSIS — Z23 Encounter for immunization: Secondary | ICD-10-CM | POA: Insufficient documentation

## 2020-01-09 NOTE — Progress Notes (Signed)
   Covid-19 Vaccination Clinic  Name:  Wendy [name]    MRN: [id number] DOB: 29-Dec-1945  01/09/2020  [name] was observed post Covid-19 immunization for 15 minutes without incidence. She was provided with Vaccine Information Sheet and instruction to access the V-Safe system.   [name] was instructed to call 911 with any severe reactions post vaccine: Marland Kitchen Difficulty breathing  . Swelling of your face and throat  . A fast heartbeat  . A bad rash all over your body  . Dizziness and weakness    Immunizations Administered    Name Date Dose VIS Date Route   Pfizer COVID-19 Vaccine 01/09/2020 11:29 AM 0.3 mL 12/04/2019 Intramuscular   Manufacturer: [place]   Lot: [id number]   [place]: [id number]

## 2020-01-12 ENCOUNTER — Ambulatory Visit: Payer: Medicare Other | Admitting: Gastroenterology

## 2020-01-30 ENCOUNTER — Ambulatory Visit: Payer: Medicare Other | Attending: Internal Medicine

## 2020-01-30 DIAGNOSIS — Z23 Encounter for immunization: Secondary | ICD-10-CM | POA: Insufficient documentation

## 2020-01-30 NOTE — Progress Notes (Signed)
   Covid-19 Vaccination Clinic  Name:  Wendy [name]    MRN: [id number] DOB: 06-13-1946  01/30/2020  [name] was observed post Covid-19 immunization for 15 minutes without incidence. She was provided with Vaccine Information Sheet and instruction to access the V-Safe system.   [name] was instructed to call 911 with any severe reactions post vaccine: Marland Kitchen Difficulty breathing  . Swelling of your face and throat  . A fast heartbeat  . A bad rash all over your body  . Dizziness and weakness    Immunizations Administered    Name Date Dose VIS Date Route   Pfizer COVID-19 Vaccine 01/30/2020 11:27 AM 0.3 mL 12/04/2019 Intramuscular   Manufacturer: [place]   Lot: [id number]   [place]: [id number]

## 2020-02-01 ENCOUNTER — Ambulatory Visit (INDEPENDENT_AMBULATORY_CARE_PROVIDER_SITE_OTHER): Payer: Medicare Other | Admitting: Gastroenterology

## 2020-02-01 ENCOUNTER — Encounter: Payer: Self-pay | Admitting: Gastroenterology

## 2020-02-01 ENCOUNTER — Other Ambulatory Visit: Payer: Self-pay

## 2020-02-01 VITALS — BP 124/78 | HR 88 | Temp 97.9°F | Ht 61.75 in | Wt 125.4 lb

## 2020-02-01 DIAGNOSIS — Z01818 Encounter for other preprocedural examination: Secondary | ICD-10-CM | POA: Diagnosis not present

## 2020-02-01 DIAGNOSIS — K5909 Other constipation: Secondary | ICD-10-CM | POA: Diagnosis not present

## 2020-02-01 DIAGNOSIS — R140 Abdominal distension (gaseous): Secondary | ICD-10-CM

## 2020-02-01 MED ORDER — DICYCLOMINE HCL 20 MG PO TABS: 20.0000 mg | ORAL_TABLET | Freq: Three times a day (TID) | ORAL | 1 refills | Status: DC

## 2020-02-01 MED ORDER — NA SULFATE-K SULFATE-MG SULF 17.5-3.13-1.6 GM/177ML PO SOLN: 1.0000 | Freq: Once | ORAL | 0 refills | Status: AC

## 2020-02-01 NOTE — Patient Instructions (Addendum)
If you are age 76 or older, your body mass index should be between 23-30. Your Body mass index is 23.12 kg/m. If this is out of the aforementioned range listed, please consider follow up with your Primary Care Provider.  If you are age 50 or younger, your body mass index should be between 19-25. Your Body mass index is 23.12 kg/m. If this is out of the aformentioned range listed, please consider follow up with your Primary Care Provider.   You have been scheduled for a colonoscopy. Please follow written instructions given to you at your visit today.  Please pick up your prep supplies at the pharmacy within the next 1-3 days. If you use inhalers (even only as needed), please bring them with you on the day of your procedure. Your physician has requested that you go to www.startemmi.com and enter the access code given to you at your visit today. This web site gives a general overview about your procedure. However, you should still follow specific instructions given to you by our office regarding your preparation for the procedure.  Due to recent changes in healthcare laws, you may see the results of your imaging and laboratory studies on MyChart before your provider has had a chance to review them.  We understand that in some cases there may be results that are confusing or concerning to you. Not all laboratory results come back in the same time frame and the provider may be waiting for multiple results in order to interpret others.  Please give [place] 48 hours in order for your provider to thoroughly review all the results before contacting the office for clarification of your results.    Continue to take Citrucel.  I have recommend a trial of dicyclomine 20 mg up to 4 times daily to see if that helps with the symptoms that develop after eating.  You are due a colonoscopy in March. Let's schedule it today.
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
## 2020-02-04 ENCOUNTER — Telehealth: Payer: Self-pay | Admitting: Gastroenterology

## 2020-02-04 NOTE — Telephone Encounter (Signed)
Please advise if there is any alternative to Dicyclomine.

## 2020-02-04 NOTE — Telephone Encounter (Signed)
Patient states she thinks she has some IBgard samples at home still. She remembers taking it in the past and it was not very helpful, FDgard helped more. I told her it was ok to take [place] as they have similar ingredients. She states that they both are very expensive but I informed her if we get some coupons or samples I will let her know.

## 2020-02-04 NOTE — Telephone Encounter (Signed)
She should stop the dicyclomine.  I recommend a trial of IBgard to see if this improves her recent symptoms.  Thank you.

## 2020-02-22 ENCOUNTER — Other Ambulatory Visit: Payer: Self-pay

## 2020-02-22 ENCOUNTER — Ambulatory Visit (AMBULATORY_SURGERY_CENTER): Payer: Medicare Other | Admitting: Gastroenterology

## 2020-02-22 ENCOUNTER — Encounter: Payer: Self-pay | Admitting: Gastroenterology

## 2020-02-22 VITALS — BP 142/74 | HR 90 | Temp 96.0°F | Resp 17 | Ht 61.0 in | Wt 125.0 lb

## 2020-02-22 DIAGNOSIS — R140 Abdominal distension (gaseous): Secondary | ICD-10-CM | POA: Diagnosis not present

## 2020-02-22 DIAGNOSIS — K5909 Other constipation: Secondary | ICD-10-CM

## 2020-02-22 DIAGNOSIS — K5730 Diverticulosis of large intestine without perforation or abscess without bleeding: Secondary | ICD-10-CM | POA: Diagnosis not present

## 2020-02-22 MED ORDER — SODIUM CHLORIDE 0.9 % IV SOLN: 500.0000 mL | Freq: Once | INTRAVENOUS | Status: DC

## 2020-02-22 NOTE — Progress Notes (Signed)
pt tolerated well. VSS. awake and to recovery. Report given to RN.  

## 2020-02-22 NOTE — Patient Instructions (Signed)
Impression/Recommendations:  Diverticulosis handout given to patient. High fiber diet handout given to patient.  Continue present medications.  Follow- up in the office to review results and discuss other options for evaluation and management.  YOU HAD AN ENDOSCOPIC PROCEDURE TODAY AT [facility]:   Refer to the procedure report that was given to you for any specific questions about what was found during the examination.  If the procedure report does not answer your questions, please call your gastroenterologist to clarify.  If you requested that your care partner not be given the details of your procedure findings, then the procedure report has been included in a sealed envelope for you to review at your convenience later.  YOU SHOULD EXPECT: Some feelings of bloating in the abdomen. Passage of more gas than usual.  Walking can help get rid of the air that was put into your GI tract during the procedure and reduce the bloating. If you had a lower endoscopy (such as a colonoscopy or flexible sigmoidoscopy) you may notice spotting of blood in your stool or on the toilet paper. If you underwent a bowel prep for your procedure, you may not have a normal bowel movement for a few days.  Please Note:  You might notice some irritation and congestion in your nose or some drainage.  This is from the oxygen used during your procedure.  There is no need for concern and it should clear up in a day or so.  SYMPTOMS TO REPORT IMMEDIATELY:   Following lower endoscopy (colonoscopy or flexible sigmoidoscopy):  Excessive amounts of blood in the stool  Significant tenderness or worsening of abdominal pains  Swelling of the abdomen that is new, acute  Fever of 100F or higher For urgent or emergent issues, a gastroenterologist can be reached at any hour by calling [phone].   DIET:  We do recommend a small meal at first, but then you may proceed to your regular diet.  Drink plenty of  fluids but you should avoid alcoholic beverages for 24 hours.  ACTIVITY:  You should plan to take it easy for the rest of today and you should NOT DRIVE or use heavy machinery until tomorrow (because of the sedation medicines used during the test).    FOLLOW UP: Our staff will call the number listed on your records 48-72 hours following your procedure to check on you and address any questions or concerns that you may have regarding the information given to you following your procedure. If we do not reach you, we will leave a message.  We will attempt to reach you two times.  During this call, we will ask if you have developed any symptoms of COVID 19. If you develop any symptoms (ie: fever, flu-like symptoms, shortness of breath, cough etc.) before then, please call [phone].  If you test positive for Covid 19 in the 2 weeks post procedure, please call and report this information to [place].    If any biopsies were taken you will be contacted by phone or by letter within the next 1-3 weeks.  Please call us at [phone] if you have not heard about the biopsies in 3 weeks.    SIGNATURES/CONFIDENTIALITY: You and/or your care partner have signed paperwork which will be entered into your electronic medical record.  These signatures attest to the fact that that the information above on your After Visit Summary has been reviewed and is understood.  Full responsibility of the confidentiality of this discharge  information lies with you and/or your care-partner. 

## 2020-02-22 NOTE — Op Note (Addendum)
[employer] Patient Name: Wendy [name] Procedure Date: 02/22/2020 1:30 PM MRN: [id number] Endoscopist: Thornton Park MD, MD Age: 74 Referring MD:  Date of Birth: 11/14/1946 Gender: Female Account #: [id number] Procedure:                Colonoscopy Indications:              Surveillance: Personal history of adenomatous                            polyps on last colonoscopy 5 years ago                           - colonoscopy in [place] in 2011: Polyp removed,                            path results not available                           - colonoscopy 03/16/15: tubular adenoma, sessile                            serrated polyp, and benign fold                           - surveillance colonoscopy recommended                            2021Constipation with associated postprandial                            bloating and distention Medicines:                Monitored Anesthesia Care Procedure:                Pre-Anesthesia Assessment:                           - Prior to the procedure, a History and Physical                            was performed, and patient medications and                            allergies were reviewed. The patient's tolerance of                            previous anesthesia was also reviewed. The risks                            and benefits of the procedure and the sedation                            options and risks were discussed with the patient.                            All questions were answered, and informed consent  was obtained. Prior Anticoagulants: The patient has                            taken no previous anticoagulant or antiplatelet                            agents. ASA Grade Assessment: II - A patient with                            mild systemic disease. After reviewing the risks                            and benefits, the patient was deemed in                            satisfactory condition to undergo  the procedure.                           After obtaining informed consent, the colonoscope                            was passed under direct vision. Throughout the                            procedure, the patient's blood pressure, pulse, and                            oxygen saturations were monitored continuously. The                            Colonoscope was introduced through the anus with                            the intention of advancing to the cecum, but, I was                            unable to advance the scope beyond 35 cm from the                            anal verge. The scope was advanced to the sigmoid                            colon before the procedure was aborted. Medications                            were given. The colonoscopy was technically                            difficult and complex due to the patient's anatomy.                            The patient tolerated the procedure well. The  quality of the bowel preparation was good. The                            rectum was photographed. The colonoscopy was                            aborted due to restricted mobility of the colon.                            Changing the patient's position, using manual                            pressure, water immersion, withdrawing and                            reinserting the scope and withdrawing the scope and                            replacing with the pediatric endoscope did not                            allow for the successful completion of the                            procedure. Scope In: 1:33:11 PM Scope Out: 1:39:00 PM Total Procedure Duration: 0 hours 5 minutes 49 seconds  Findings:                 The perianal and digital rectal examinations were                            normal except for perianal irritation.                           Multiple small-mouthed diverticula were found in                            the examined  sigmoid colon.                           Approximately 30 cm from the anal verge there is a                            sharp angulation in the colon in the area of dense                            diverticulosis where the colon appears to be fixed.                            I was unable to safely advance through this area                            using the adult colonoscope or the pediatric  colonoscope using water immersion. There was some                            mild scope trauma in this area. The exam examined                            colonic mucosa appeared normal. But, there was a                            limited exam due to inability to advance beyond the                            sigmoid colon. Complications:            No immediate complications. Estimated Blood Loss:     Estimated blood loss: none. Impression:               - Diverticulosis in the sigmoid colon.                           - The exam was aborted in the sigmoid colon due to                            anatomy and my concerns for a high risk for                            perforation. Recommendation:           - Patient has a contact number available for                            emergencies. The signs and symptoms of potential                            delayed complications were discussed with the                            patient. Return to normal activities tomorrow.                            Written discharge instructions were provided to the                            patient.                           - High fiber diet. Drink at least 64 ounces of                            water daily. Add a daily stool bulking agent such                            as Metamucil.                           -  Continue present medications.                           - Scheduled CT colonoscopy.                           - Follow-up in the office to review these results                             and discuss other options for evaluation and                            management. Thornton Park MD, MD 02/22/2020 1:54:43 PM This report has been signed electronically.

## 2020-02-22 NOTE — Progress Notes (Signed)
Temp-JB VS-KA  

## 2020-02-23 ENCOUNTER — Other Ambulatory Visit: Payer: Self-pay | Admitting: Gastroenterology

## 2020-02-23 ENCOUNTER — Telehealth: Payer: Self-pay | Admitting: Gastroenterology

## 2020-02-23 DIAGNOSIS — R140 Abdominal distension (gaseous): Secondary | ICD-10-CM

## 2020-02-23 DIAGNOSIS — Z86010 Personal history of colonic polyps: Secondary | ICD-10-CM

## 2020-02-23 DIAGNOSIS — K5900 Constipation, unspecified: Secondary | ICD-10-CM

## 2020-02-23 NOTE — Telephone Encounter (Signed)
Called [facility] to and scheduled CT colonoscopy for 3/18 at 9:00 am ([address], 1st [address], [address]). They will mail the prep to the patient.   OV scheduled with Dr. Tarri Glenn on 3/25 at 8:50 am to discuss the CT colonoscopy.

## 2020-02-23 NOTE — Telephone Encounter (Signed)
-----   Message from Thornton Park, MD sent at 02/22/2020  2:40 PM EST ----- Please schedule CT colonoscopy for the history of colon polyps, incomplete colonoscopy today She then needs an office visit with me one week later.  Thank you.

## 2020-02-23 NOTE — Telephone Encounter (Signed)
Pt informed that previous colonoscopy was in 2016 by Dr. Olevia Perches.  FYI.

## 2020-02-23 NOTE — Telephone Encounter (Signed)
Thank you. I had reviewed her prior records from Dr. Olevia Perches prior to her consultation. How is she feeling today after the attempted colonoscopy?

## 2020-02-24 ENCOUNTER — Telehealth: Payer: Self-pay

## 2020-02-24 NOTE — Telephone Encounter (Signed)
Thanks for the update

## 2020-02-24 NOTE — Telephone Encounter (Signed)
Patient states she is feeling fine. She is just tired after her procedure.

## 2020-02-24 NOTE — Telephone Encounter (Signed)
  Follow up Call-  Call back number 02/22/2020  Post procedure Call Back phone  # [phone]  Permission to leave phone message Yes  Some recent data might be hidden     Patient questions:  Do you have a fever, pain , or abdominal swelling? No. Pain Score  0 *  Have you tolerated food without any problems? Yes.    Have you been able to return to your normal activities? Yes.    Do you have any questions about your discharge instructions: Diet   No. Medications  No. Follow up visit  No.  Do you have questions or concerns about your Care? No.  Actions: * If pain score is 4 or above: 1. No action needed, pain <4.Have you developed a fever since your procedure? no  2.   Have you had an respiratory symptoms (SOB or cough) since your procedure? no  3.   Have you tested positive for COVID 19 since your procedure no  4.   Have you had any family members/close contacts diagnosed with the COVID 19 since your procedure?  no   If yes to any of these questions please route to Joylene John, RN and Alphonsa Gin, Therapist, sports.

## 2020-03-03 ENCOUNTER — Other Ambulatory Visit: Payer: Self-pay | Admitting: Gastroenterology

## 2020-03-10 ENCOUNTER — Ambulatory Visit
Admission: RE | Admit: 2020-03-10 | Discharge: 2020-03-10 | Disposition: A | Discharge status: Home / Self Care | Payer: Medicare Other | Source: Ambulatory Visit | Attending: Gastroenterology | Admitting: Gastroenterology

## 2020-03-10 DIAGNOSIS — K5900 Constipation, unspecified: Secondary | ICD-10-CM

## 2020-03-10 DIAGNOSIS — R140 Abdominal distension (gaseous): Secondary | ICD-10-CM

## 2020-03-10 DIAGNOSIS — Z86010 Personal history of colonic polyps: Secondary | ICD-10-CM

## 2020-03-10 IMAGING — CT CT VIRTUAL COLONOSCOPY DIAGNOSTIC
3 of 9 series · 15 of 46 positions shown, 17 images · non-contrast
Comparison: Colonoscopy report of [DATE]-this evaluated to the
level of the sigmoid. [DATE] abdominopelvic CT.

CLINICAL DATA: Incomplete colonoscopy. Question diverticulum.
Hysterectomy and cholecystectomy. Adenomatous polyps on prior
colonoscopy.

EXAM:
CT VIRTUAL COLONOSCOPY DIAGNOSTIC
TECHNIQUE: The patient was given a standard Bowel preparation with Gastrografin
and barium for fluid and stool tagging respectively. The quality of
the bowel preparation is excellent. Automated CO2 insufflation of
the colon was performed prior to image acquisition and colonic
distention is moderate. Image post processing was used to generate a
3D endoluminal fly-through projection of the colon and to
electronically subtract stool/fluid as appropriate.

[Series 5: supine colon 3.00 br40 s3 cor supine · coronal · 0.64mm/px · 3 of 108 slices shown]
[im 22/108  soft-tissue]
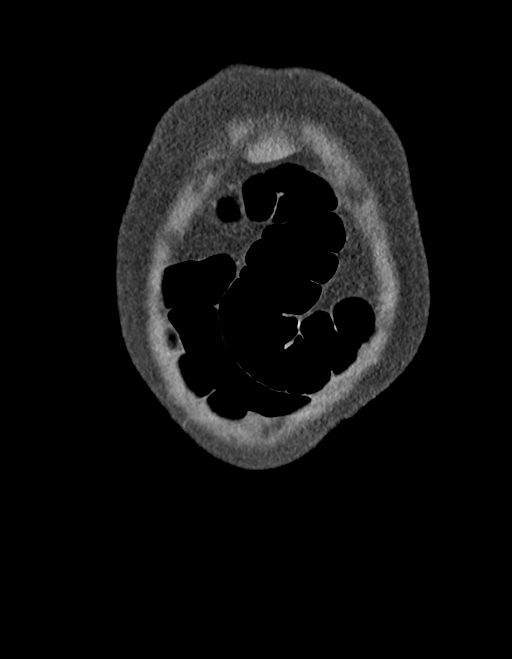
[im 43/108  soft-tissue]
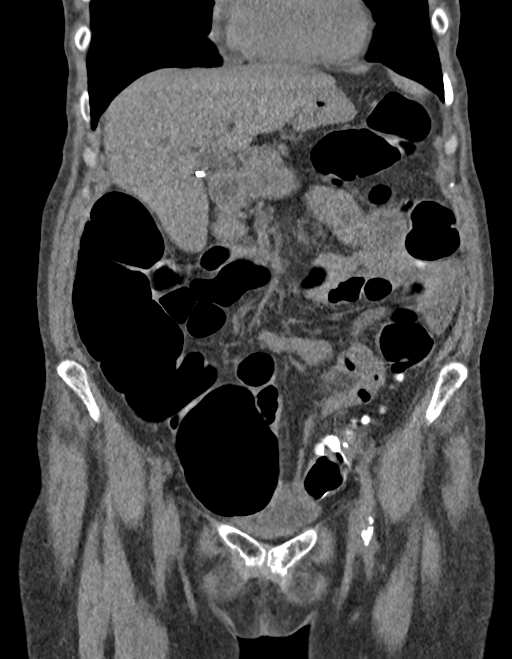
[im 65/108  soft-tissue]
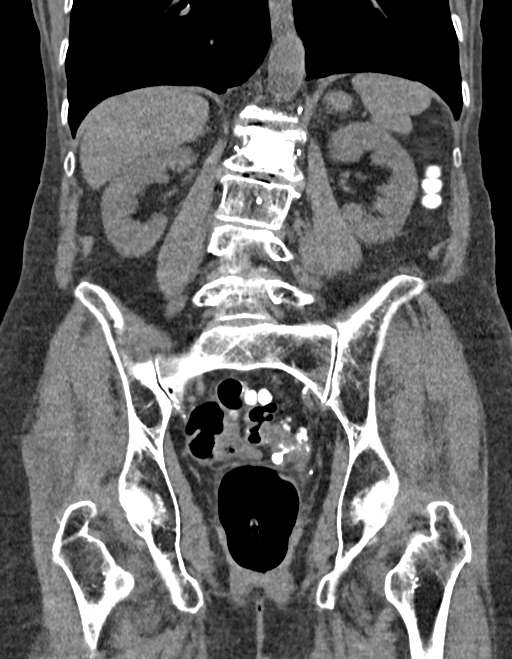

[Series 11: prone colon 1.50 br40 s3 prone thin · axial · 0.70mm/px · z∈[+1039,+1387]mm · 9 of 292 slices shown, 11 images]
[im 30/292  soft-tissue]
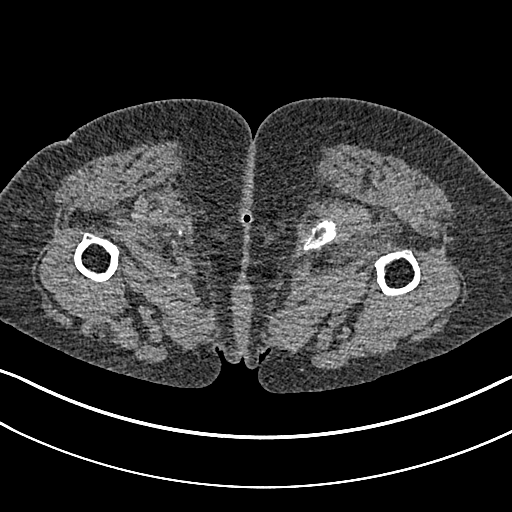
[im 30/292  bone]
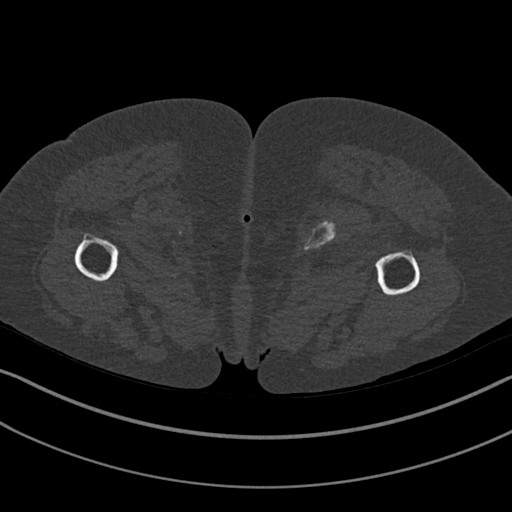
[im 59/292  soft-tissue]
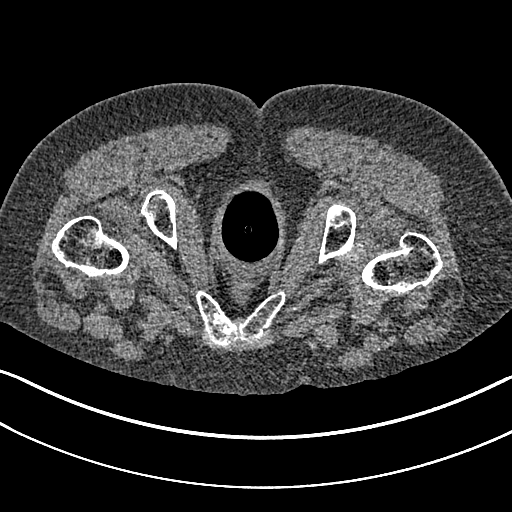
[im 88/292  soft-tissue]
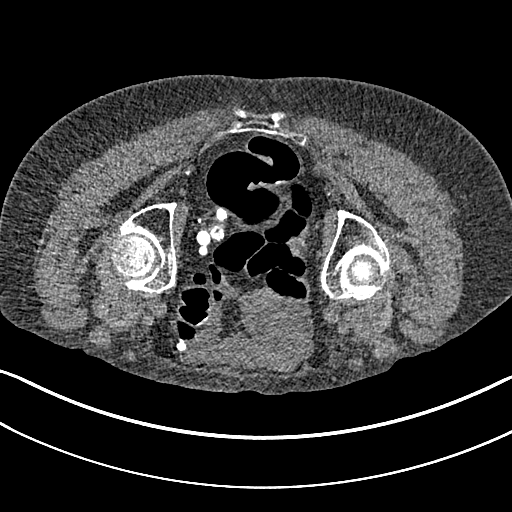
[im 117/292  soft-tissue]
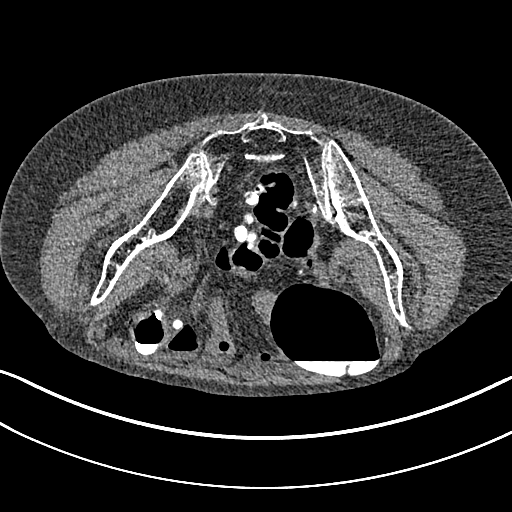
[im 146/292  soft-tissue]
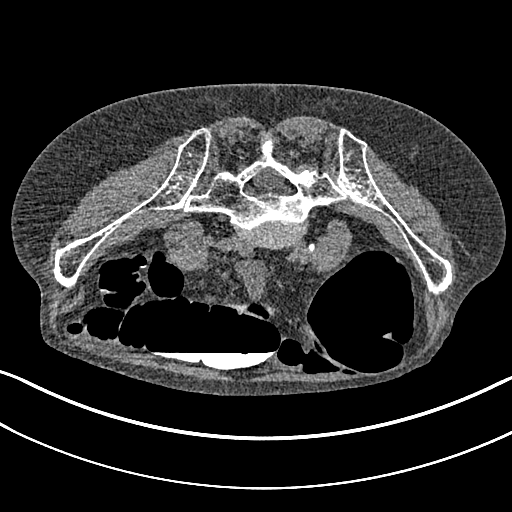
[im 175/292  soft-tissue]
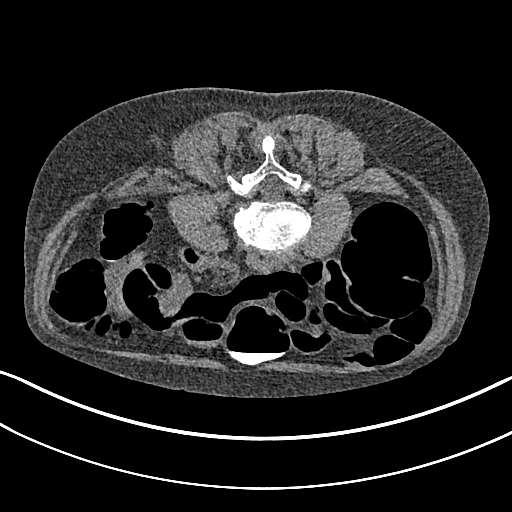
[im 204/292  soft-tissue]
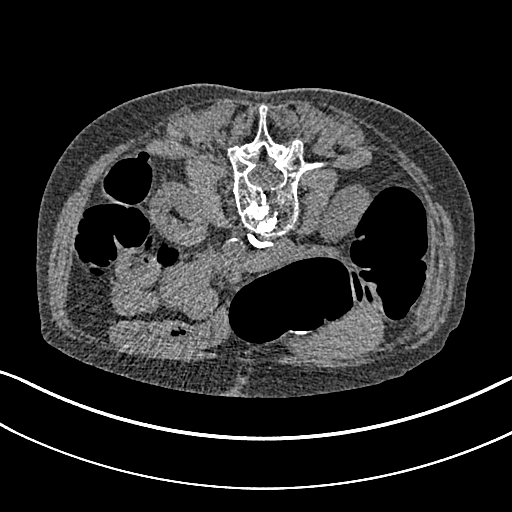
[im 233/292  soft-tissue]
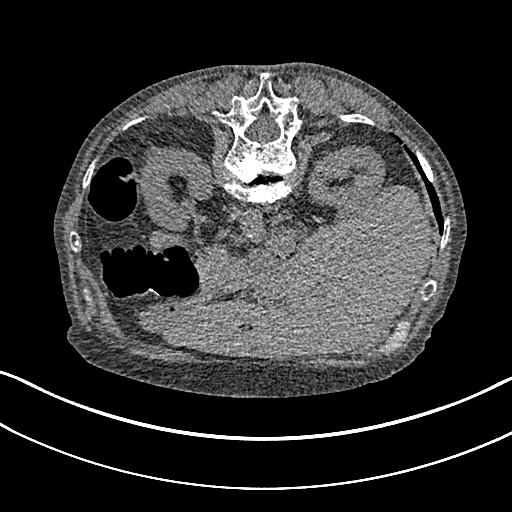
[im 262/292  soft-tissue]
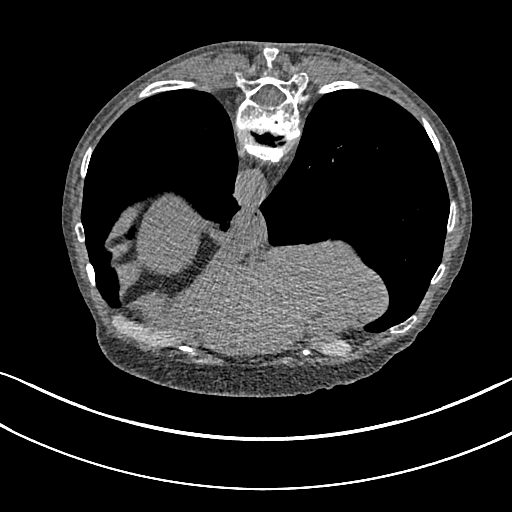
[im 262/292  bone]
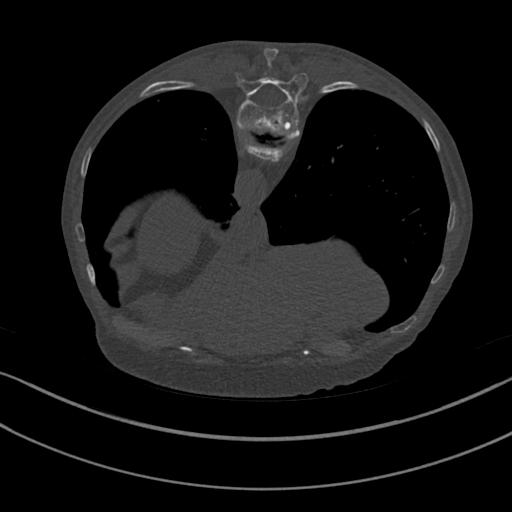

[Series 12: prone colon 3.00 br40 s3 prone 3mm · axial · 0.70mm/px · z∈[+1105,+1321]mm · 3 of 146 slices shown]
[im 37/146  soft-tissue]
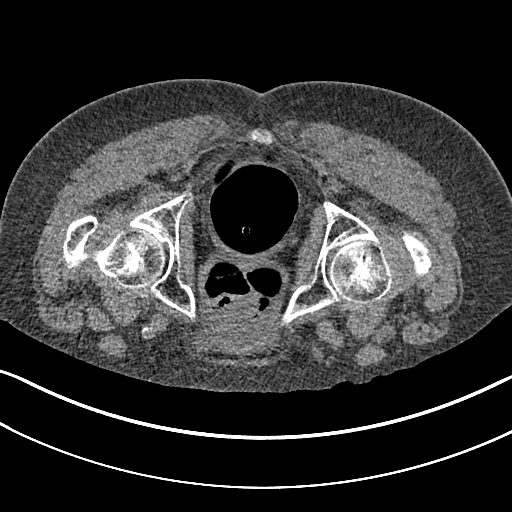
[im 73/146  soft-tissue]
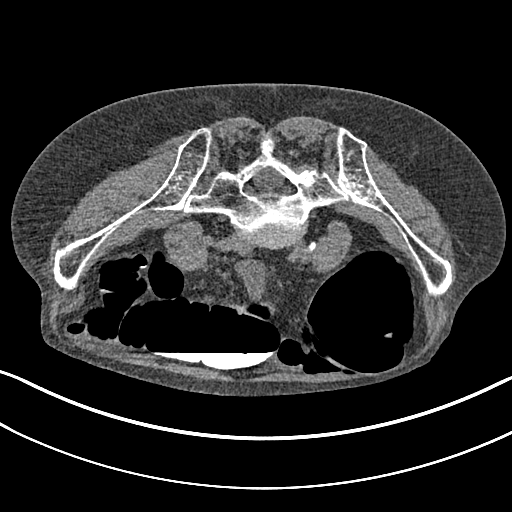
[im 109/146  soft-tissue]
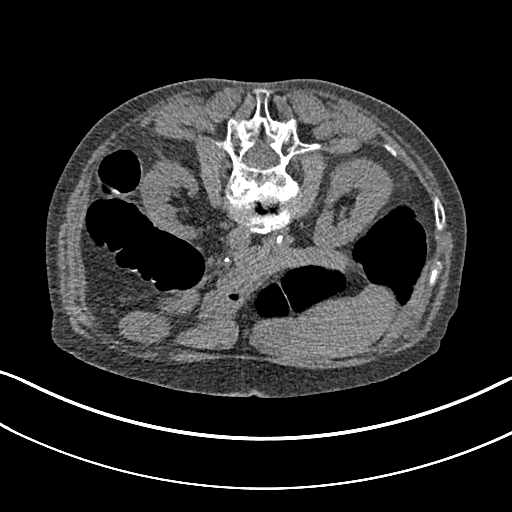

[15 of 46 positions shown; findings below may reference images not displayed]

FINDINGS: VIRTUAL COLONOSCOPY

On supine and less so prone imaging, the sigmoid is underdistended
with diffuse wall thickening and extensive diverticula. No
circumferential mass. More proximally, there is a moderate amount of
retained contrast, scattered colonic diverticula, but no evidence of
clinically significant colonic polyp or mass.

Virtual colonoscopy is not designed to detect diminutive polyps
(i.e., less than or equal to 5 mm), the presence or absence of which
may not affect clinical management.

CT ABDOMEN AND PELVIS WITHOUT CONTRAST

Lower chest: Emphysema. Innumerable right greater than left
pulmonary nodules on the order of 4 mm and less. Normal heart size
without pericardial or pleural effusion. Possible distal esophageal
wall thickening, including on [DATE].

Bilateral fat containing Bochdalek's hernias.

Hepatobiliary: Probable focal steatosis within the anterior left
hepatic lobe. Cholecystectomy, without biliary ductal dilatation.

Pancreas: Normal, without mass or ductal dilatation.

Spleen: Normal in size, without focal abnormality.

Adrenals/Urinary Tract: Normal adrenal glands. No renal calculi or
hydronephrosis. Interpolar left renal hypoattenuating lesion of
cm is likely a cyst or minimally complex cyst. No bladder calculi.

Stomach/Bowel: Normal stomach, without wall thickening. Normal
terminal ileum and appendix. Normal small bowel. No free
intraperitoneal air.

Vascular/Lymphatic: Aortic atherosclerosis. No abdominopelvic
adenopathy.

Reproductive: Hysterectomy.  No adnexal mass.

Other: No significant free fluid. Within the right-side of the
pelvic cul-de-sac, soft tissue and gas measures 3.7 x 3.3 cm on
97/4. This does not persist on prone imaging, and is therefore
favored to represent underdistended small bowel loops.

Musculoskeletal: Osteopenia. Remote right posterior eleventh rib
fracture. Vertebral augmentation at T12 through L3. The most
significant underlying compression deformity is at L1. There is also
severe T10 compression deformity. The lumbar findings are relatively
similar to [DATE].
IMPRESSION: 1. Extensive colonic diverticulosis with sigmoid underdistention and
wall thickening, likely related to muscular hypertrophy. No
circumferential mass in this area.
2. More proximally, no evidence of clinically significant colonic
polyp or mass. Moderate amount of retained contrast.
3.  Emphysema ([QI]-[QI]).
4. Tiny bilateral pulmonary nodules. Consider further evaluation
with dedicated chest CT.
5. Distal esophageal wall thickening suggests esophagitis.

## 2020-03-15 ENCOUNTER — Telehealth: Payer: Self-pay | Admitting: Gastroenterology

## 2020-03-15 ENCOUNTER — Other Ambulatory Visit: Payer: Self-pay | Admitting: *Deleted

## 2020-03-15 DIAGNOSIS — R140 Abdominal distension (gaseous): Secondary | ICD-10-CM

## 2020-03-15 NOTE — Telephone Encounter (Signed)
I am sorry that she is having trouble. If she is having abdominal distension and ongoing nausea and vomiting, she needs further evaluation with a 2 way of the abdomen. If she is not ans is also eating and passing gas, but just not having a bowel movement she could try the Citrucel as she used before or Miralax. Thank you.

## 2020-03-15 NOTE — Telephone Encounter (Signed)
Virtual colon last Thursday, struck with food poisoning after eating a tomato and mayonnaise sandwich Saturday, vomited all day Saturday into Sunday morning. The patient is complaining of not being able to produce a BM. She reports she "feels full" and wanted to know if she should take miralax or citrucel. I told the patient miralax would help produce a BM but she wanted instruction from Dr. Tarri Glenn, please advise?

## 2020-03-15 NOTE — Telephone Encounter (Signed)
The patient denies nausea and vomiting but endorses abd distension and not passing gas. 2 way xray ordered, patient will be here in the morning for imaging.

## 2020-03-15 NOTE — Telephone Encounter (Signed)
Pt had food poisoning last Saturday afternoon, she states that she is no longer vomiting but she is still having trouble passing gas. She wants to know what she could do? She wonders if she could take citrucel. Pls call her.

## 2020-03-16 ENCOUNTER — Ambulatory Visit (INDEPENDENT_AMBULATORY_CARE_PROVIDER_SITE_OTHER)
Admission: RE | Admit: 2020-03-16 | Discharge: 2020-03-16 | Disposition: A | Discharge status: Home / Self Care | Payer: Medicare Other | Source: Ambulatory Visit | Attending: Gastroenterology | Admitting: Gastroenterology

## 2020-03-16 ENCOUNTER — Other Ambulatory Visit: Payer: Self-pay

## 2020-03-16 ENCOUNTER — Telehealth: Payer: Self-pay | Admitting: Gastroenterology

## 2020-03-16 DIAGNOSIS — R140 Abdominal distension (gaseous): Secondary | ICD-10-CM | POA: Diagnosis not present

## 2020-03-16 IMAGING — DX DG ABDOMEN 2V
1 series · 2 of 2 positions shown · non-contrast
Comparison: CT colonography [DATE]

CLINICAL DATA: Abdominal distension. Constipation for 3-4 days.
Recent colonoscopy. Patient reports food poisoning on [REDACTED]

EXAM:
ABDOMEN - 2 VIEW

[Series 1: abdomen supine · 0.14mm/px · 2 of 2 slices shown]
[im 1/2]
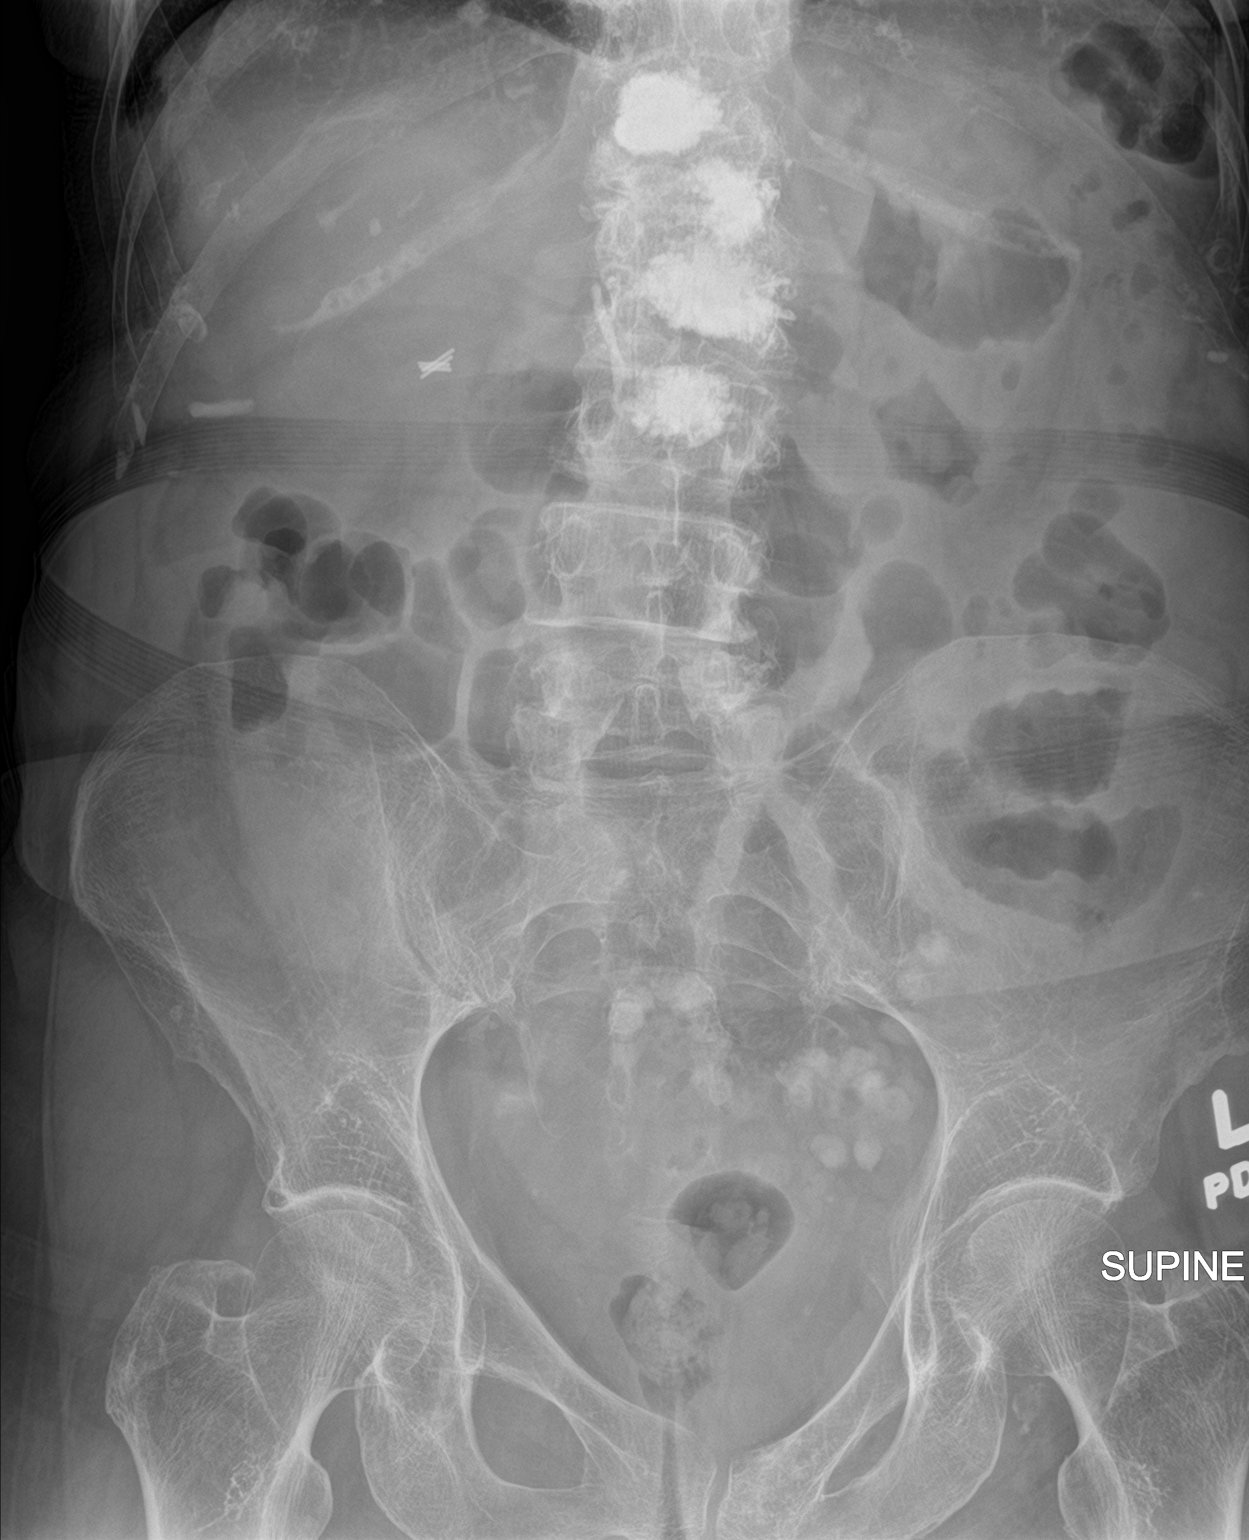
[im 2/2]
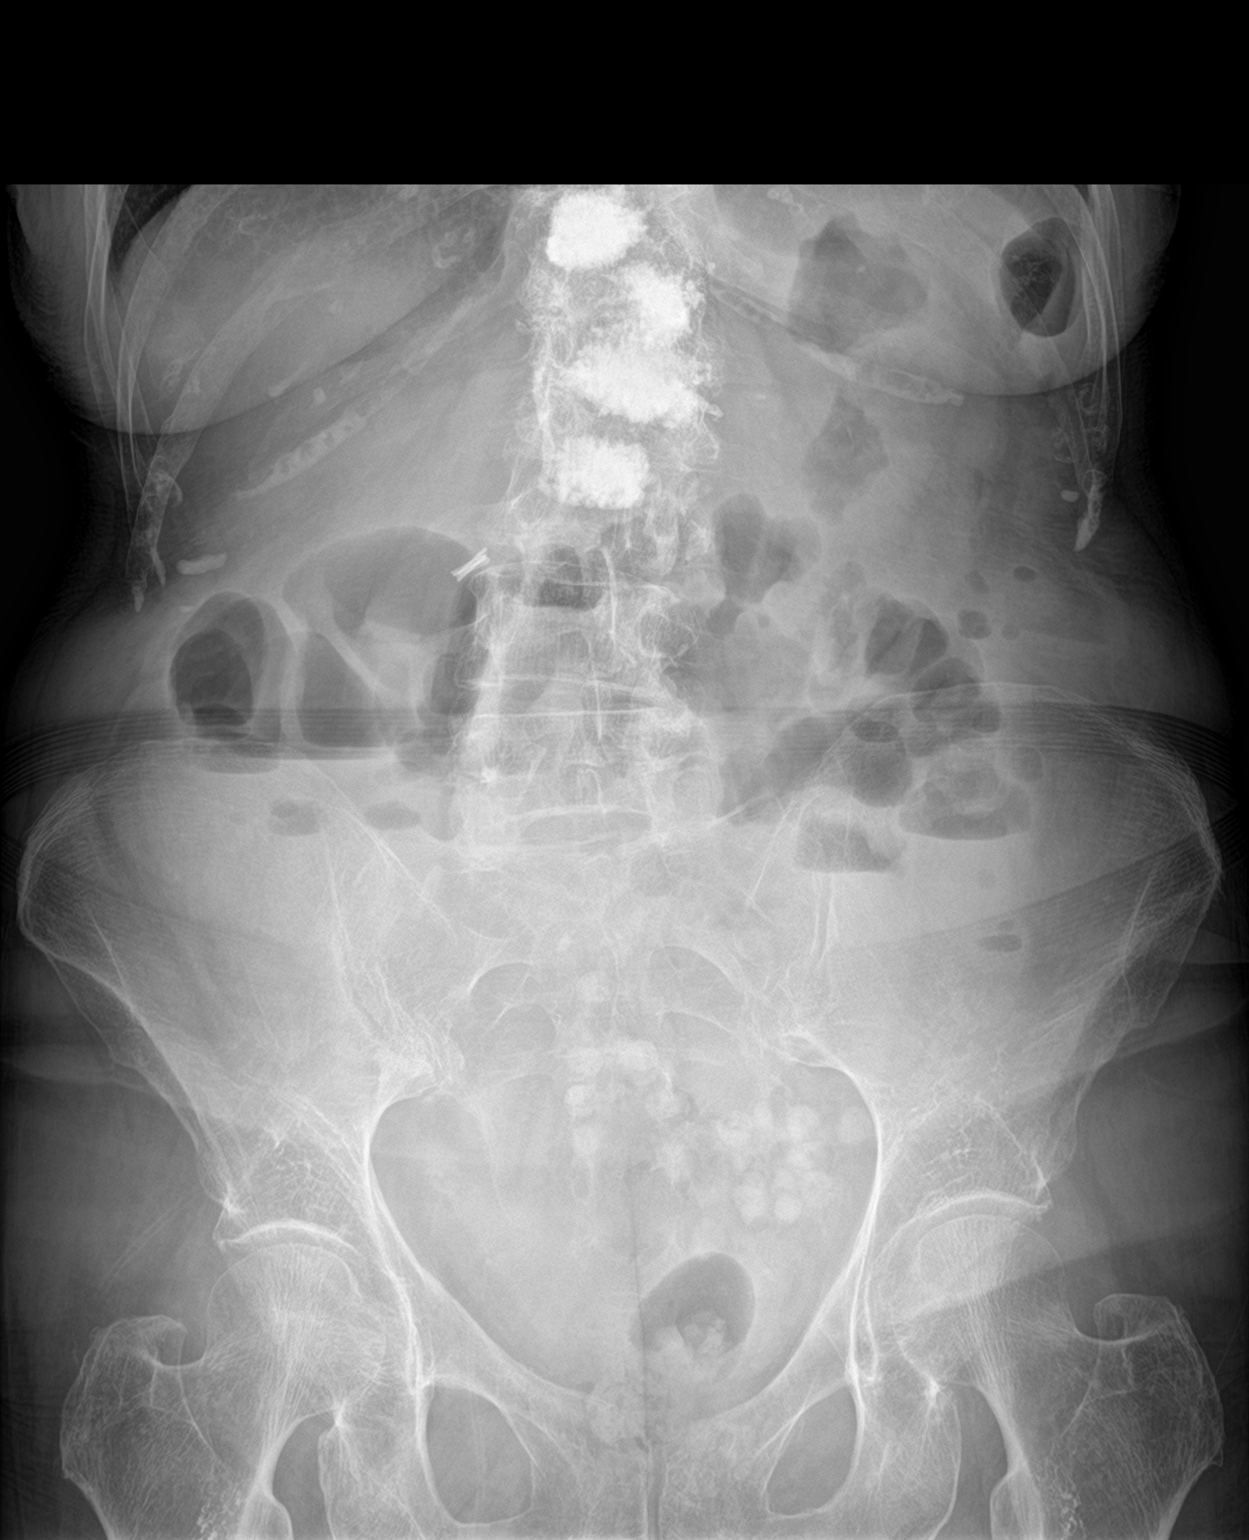

[2 of 2 positions shown; findings below may reference images not displayed]

FINDINGS: No free intra-abdominal air. No small bowel dilatation to suggest
obstruction. Air within nondilated small and large bowel in a
nonobstructive pattern. Few air-fluid levels noted within small
bowel and hepatic flexure. Contrast within diverticula in the distal
colon. Small volume of rectosigmoid stool. Cholecystectomy clips in
the right upper quadrant. Vertebral augmentation in the upper lumbar
spine. Lung bases are clear.
IMPRESSION: 1. Nonobstructive bowel gas pattern. No free air.
2. Few scattered air-fluid levels within small bowel loops in the
left abdomen and hepatic flexure of the colon are nonspecific, can
be seen in the setting of enteritis.
3. Colonic diverticulosis.

## 2020-03-16 NOTE — Telephone Encounter (Signed)
See telephone note.

## 2020-03-16 NOTE — Telephone Encounter (Signed)
Patient called about imaging results, please call patient.

## 2020-03-17 ENCOUNTER — Telehealth: Payer: Self-pay | Admitting: Family Medicine

## 2020-03-17 ENCOUNTER — Encounter: Payer: Self-pay | Admitting: Gastroenterology

## 2020-03-17 ENCOUNTER — Ambulatory Visit (INDEPENDENT_AMBULATORY_CARE_PROVIDER_SITE_OTHER): Payer: Medicare Other | Admitting: Gastroenterology

## 2020-03-17 VITALS — BP 152/90 | HR 92 | Temp 98.6°F | Ht 61.75 in | Wt 122.4 lb

## 2020-03-17 DIAGNOSIS — R918 Other nonspecific abnormal finding of lung field: Secondary | ICD-10-CM | POA: Insufficient documentation

## 2020-03-17 DIAGNOSIS — K5909 Other constipation: Secondary | ICD-10-CM | POA: Diagnosis not present

## 2020-03-17 DIAGNOSIS — R140 Abdominal distension (gaseous): Secondary | ICD-10-CM | POA: Diagnosis not present

## 2020-03-17 DIAGNOSIS — K529 Noninfective gastroenteritis and colitis, unspecified: Secondary | ICD-10-CM | POA: Diagnosis not present
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
## 2020-03-17 NOTE — Telephone Encounter (Signed)
Please let pt know I ordered her CT of the chest to better visualize small pulmonary nodules seen incidentally on her GI study  I will route to [facility]

## 2020-03-17 NOTE — Patient Instructions (Addendum)
I am so glad that you are starting to feel better.   Please continue to use the Citrucel twice daily.  I gave you a copy of your CT colonography. There are some unexplained pulmonary nodules. I recommend that you review these findings with Dr. Glori Bickers to determine what needs to be done for evaluation/follow-up.   Let's plan to follow-up in 2 months. Please call me if you need anything earlier.

## 2020-03-17 NOTE — Telephone Encounter (Signed)
-----   Message from Thornton Park, MD sent at 03/17/2020  9:25 AM EDT ----- I hope that you are well. I wanted to let you know that Mrs. Patin's CT colonography show tiny bilateral pulmonary nodules. The radiologist recommended further evaluation with dedicated chest CT. Is that something that you would be willing to arrange?    Thornton Park

## 2020-03-18 NOTE — Telephone Encounter (Signed)
Contacted pt and apologized she was not contacted before [facility] contacted her to schedule her next CT. Pt was appreciative and also apologized and wanted Anastasiya, Shapale and Dr. Glori Bickers know she is very grateful for them and thinks they do a wonderful job.  Advised if anything is needed to contact the office. Pt verbalized understanding.

## 2020-03-18 NOTE — Telephone Encounter (Signed)
Patient called this morning in regards to getting a call from the imaging place about her CT and patient was not aware of this yet and it scared her. Patient was calling because she was upset that no one has told her anything and she felt like she was just a "pin cushion" and been jerk around. Patient was aware about results from Dr Tarri Glenn office about imaging findings but she did not know what the next step was going to be or anything. I apologized to the patient several times. I advised that Dr Glori Bickers sent a message to her nurse last night and we did not have a chance to call to speak with her yet this morning but the order was already put in and the imaging was already calling. Patient said that the imaging place is trying to be efficient and she likes that but she needs to know what is going on so she is not scared like that. Patient stated she knows that we are busy but they are people that need to be advised of things like that. I advised patient that I would send a message back and see if Dr Glori Bickers can call and speak with her about everything, but patient said she does not think anyone really needs to call her back but said "just do not do that to people/anyone." I apologized again.

## 2020-03-20 ENCOUNTER — Telehealth: Payer: Self-pay | Admitting: Internal Medicine

## 2020-03-20 NOTE — Telephone Encounter (Signed)
Patient called c/o constipation. No BM yesterday or today. Otherwise well. Seen 3 days ago in the office for the same (reviewed). Advised to take Miralax (she if familiar with and has some at home ) until [facility]. Thereafter, titrate to need.

## 2020-03-23 NOTE — Telephone Encounter (Signed)
Many thanks for your help.  

## 2020-03-28 ENCOUNTER — Encounter: Payer: Self-pay | Admitting: Family Medicine

## 2020-03-28 ENCOUNTER — Ambulatory Visit (INDEPENDENT_AMBULATORY_CARE_PROVIDER_SITE_OTHER): Payer: Medicare Other | Admitting: Family Medicine

## 2020-03-28 ENCOUNTER — Other Ambulatory Visit: Payer: Self-pay | Admitting: Family Medicine

## 2020-03-28 ENCOUNTER — Other Ambulatory Visit: Payer: Self-pay

## 2020-03-28 VITALS — BP 128/80 | HR 110 | Temp 97.7°F | Ht 61.75 in | Wt 124.0 lb

## 2020-03-28 DIAGNOSIS — I10 Essential (primary) hypertension: Secondary | ICD-10-CM

## 2020-03-28 DIAGNOSIS — E871 Hypo-osmolality and hyponatremia: Secondary | ICD-10-CM

## 2020-03-28 DIAGNOSIS — R918 Other nonspecific abnormal finding of lung field: Secondary | ICD-10-CM | POA: Diagnosis not present

## 2020-03-28 DIAGNOSIS — K5909 Other constipation: Secondary | ICD-10-CM

## 2020-03-28 LAB — RENAL FUNCTION PANEL
Albumin: 3.4 g/dL — ABNORMAL LOW (ref 3.5–5.2)
BUN: 8 mg/dL (ref 6–23)
CO2: 27 mEq/L (ref 19–32)
Calcium: 10.1 mg/dL (ref 8.4–10.5)
Chloride: 93 mEq/L — ABNORMAL LOW (ref 96–112)
Creatinine, Ser: 0.91 mg/dL (ref 0.40–1.20)
GFR: 60.17 mL/min (ref >60.00)
Glucose, Bld: 99 mg/dL (ref 70–99)
Phosphorus: 3.9 mg/dL (ref 2.3–4.6)
Potassium: 4.5 mEq/L (ref 3.5–5.1)
Sodium: 129 mEq/L — ABNORMAL LOW (ref 135–145)

## 2020-03-28 NOTE — Patient Instructions (Addendum)
Start measuring the amount of fluid you drink daily  We will check your sodium level today   Eat a healthy diet  Continue citrucel   Get chest CT as planned
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
## 2020-03-28 NOTE — Assessment & Plan Note (Signed)
Ongoing  Has to watch fluid intake - asked her to start measuring daily volume Difficult-in light of chronic constipation  If na level gets lower-would consider nephrology ref

## 2020-03-28 NOTE — Assessment & Plan Note (Signed)
bp in fair control at this time  BP Readings from Last 1 Encounters:  03/28/20 128/80   No changes needed Most recent labs reviewed  Disc lifstyle change with low sodium diet and exercise  She is tolerating amlodipine and losartan currently

## 2020-03-28 NOTE — Assessment & Plan Note (Signed)
Tiny nodules seen incidentally on GI study Prior smoker (not since 1995)  No symptoms CT of chest ordered to eval further -this is upcoming

## 2020-03-28 NOTE — Assessment & Plan Note (Signed)
Ongoing  Rev CT colonoscopy - mod to severe diverticulosis  No h/o diverticulitis Now taking a fiber supplement Has to watch fluid intake due to hyponatremia

## 2020-03-29 ENCOUNTER — Telehealth: Payer: Self-pay | Admitting: *Deleted

## 2020-03-29 NOTE — Telephone Encounter (Signed)
Addressed in results note

## 2020-03-29 NOTE — Telephone Encounter (Signed)
Name of [place] Name of [place] or Written Date and Quantity:11/30/19 #30 tabs with 3 refills Last Office Visit and Type:Discuss Colonoscopy 03/28/20 Next Office Visit and Type:CPE on 10/19/20

## 2020-03-29 NOTE — Telephone Encounter (Signed)
Left VM requesting pt to call the office back regarding lab results  

## 2020-03-30 ENCOUNTER — Telehealth: Payer: Self-pay

## 2020-03-30 NOTE — Telephone Encounter (Signed)
Pt due for next Prolia injection 04-27-20.  Pt has MCR and supp.  Pt would owe approximately $0.

## 2020-04-01 NOTE — Telephone Encounter (Signed)
Pt had labs on 4/4 and Ca is normal and CrCl is 47.39. Labs ok for injection. Pt scheduled for prolia on 5/6.

## 2020-04-04 ENCOUNTER — Ambulatory Visit
Admission: RE | Admit: 2020-04-04 | Discharge: 2020-04-04 | Disposition: A | Discharge status: Home / Self Care | Payer: Medicare Other | Source: Ambulatory Visit | Attending: Family Medicine | Admitting: Family Medicine

## 2020-04-04 DIAGNOSIS — R918 Other nonspecific abnormal finding of lung field: Secondary | ICD-10-CM

## 2020-04-04 IMAGING — CT CT CHEST W/O CM
2 of 4 series · 15 of 36 positions shown, 18 images · non-contrast
Comparison: CT abdomen [DATE] and recent CT virtual colonoscopy
[DATE].

CLINICAL DATA: Evaluate lung nodule seen on recent abdominal CT.
Previous smoker.

EXAM:
CT CHEST WITHOUT CONTRAST
TECHNIQUE: Multidetector CT imaging of the chest was performed following the
standard protocol without IV contrast.

[Series 2: chest 2.00 br40 s3 · axial · 0.62mm/px · z∈[+1415,+1671]mm · 12 of 152 slices shown, 15 images (1 of 2)]
[im 12/152  mediastinal]
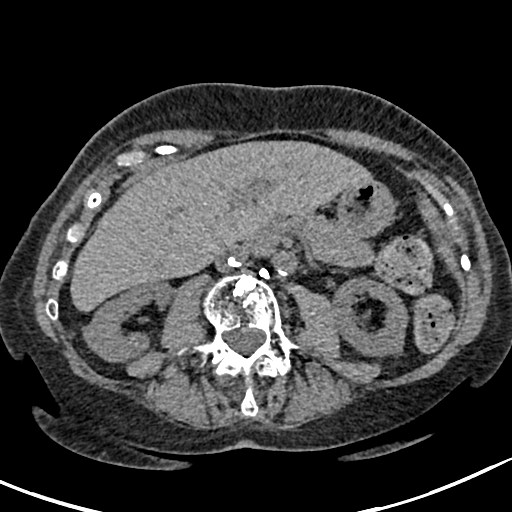
[im 12/152  lung]
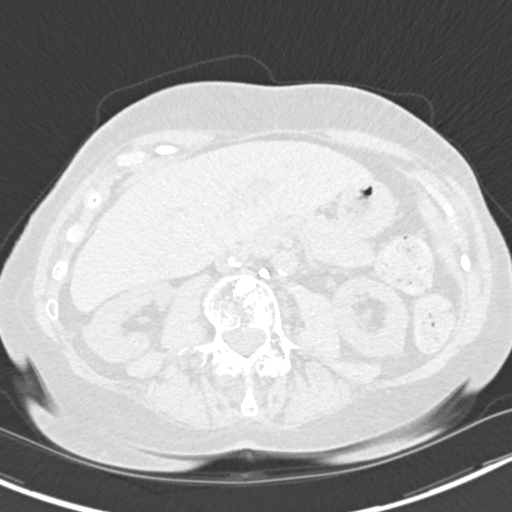
[im 24/152  lung]
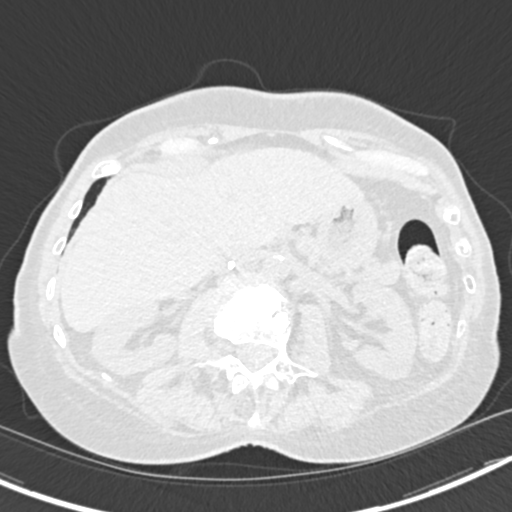
[im 35/152  lung]
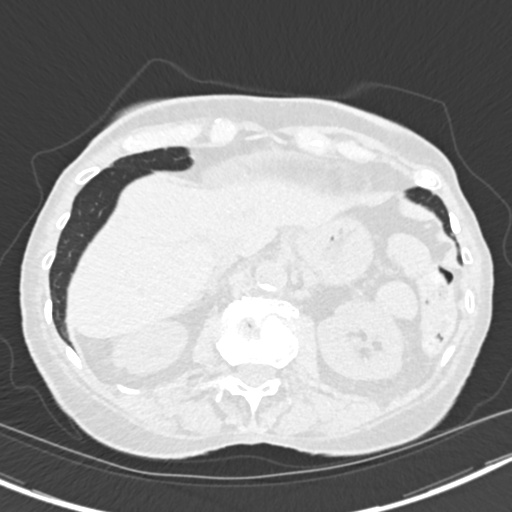
[im 47/152  lung]
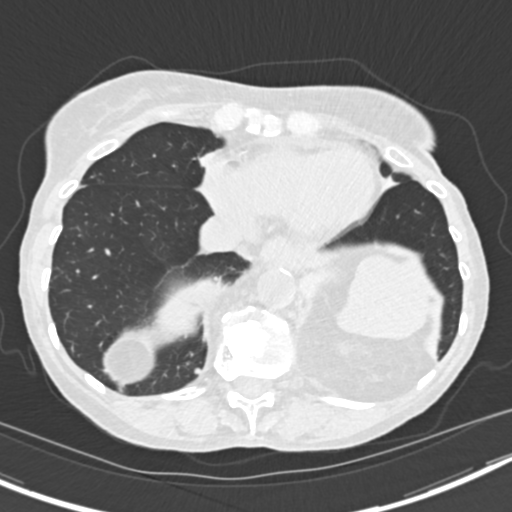
[im 59/152  mediastinal]
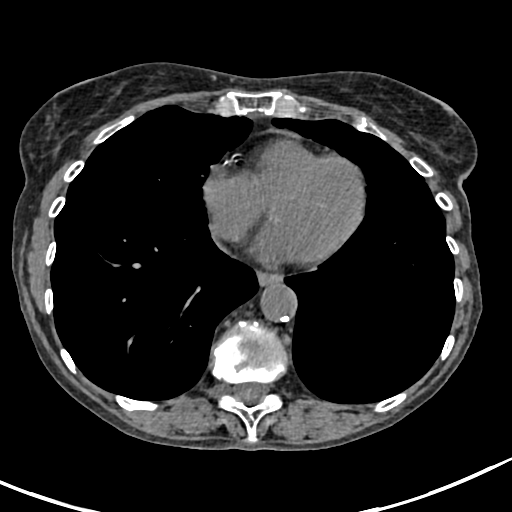
[im 59/152  lung]
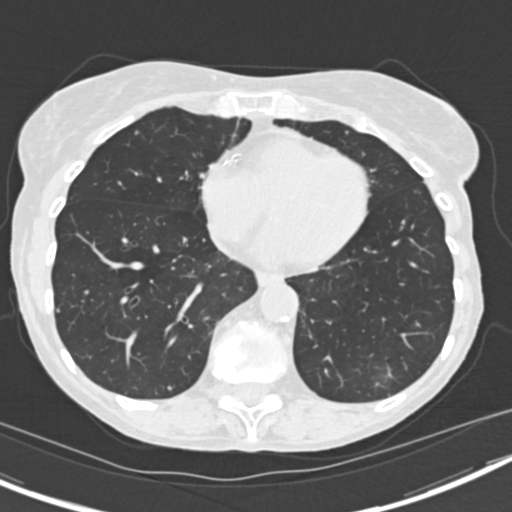
[im 70/152  lung]
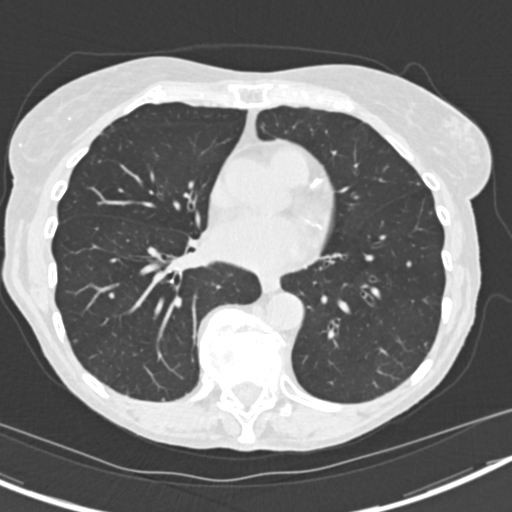
[im 82/152  lung]
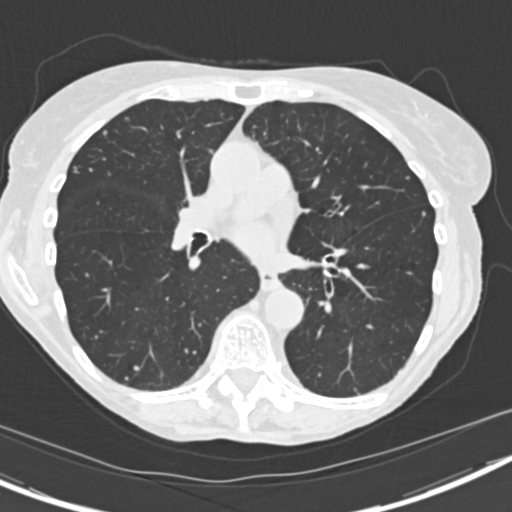
[im 93/152  lung]
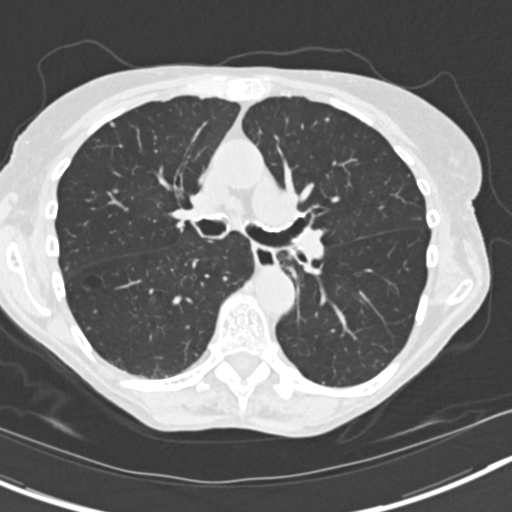
[im 105/152  mediastinal]
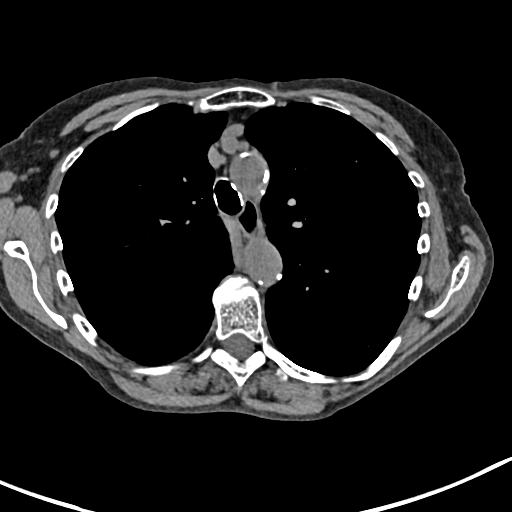
[im 105/152  lung]
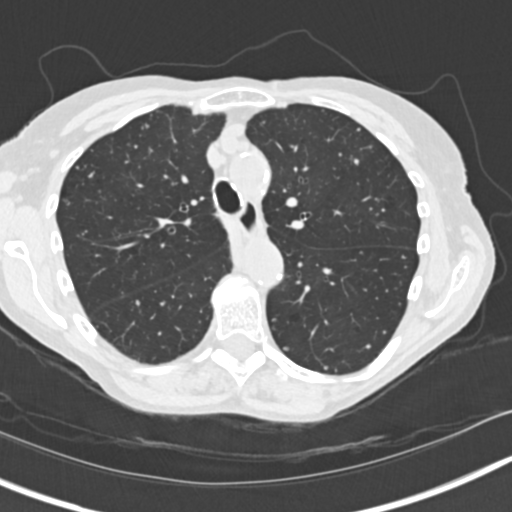
[im 117/152  lung]
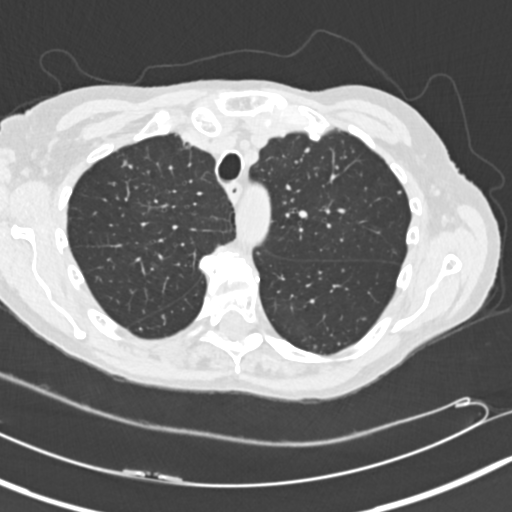
[im 128/152  lung]
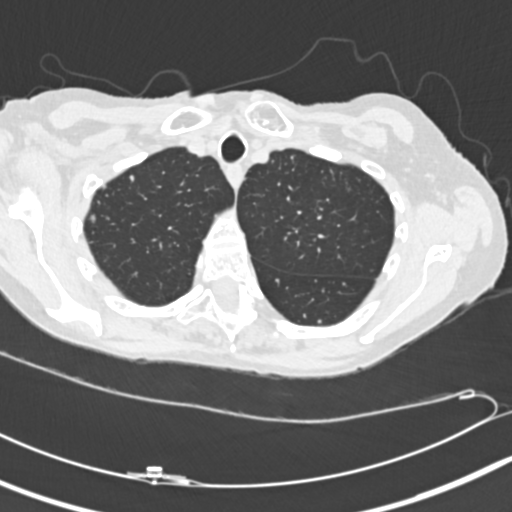
[im 140/152  lung]
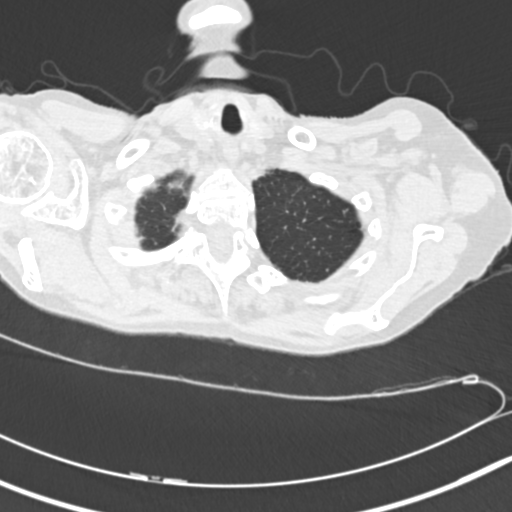

[Series 4: chest 2.00 br40 s3 · coronal · 0.59mm/px · 3 of 158 slices shown (2 of 2)]
[im 32/158  lung]
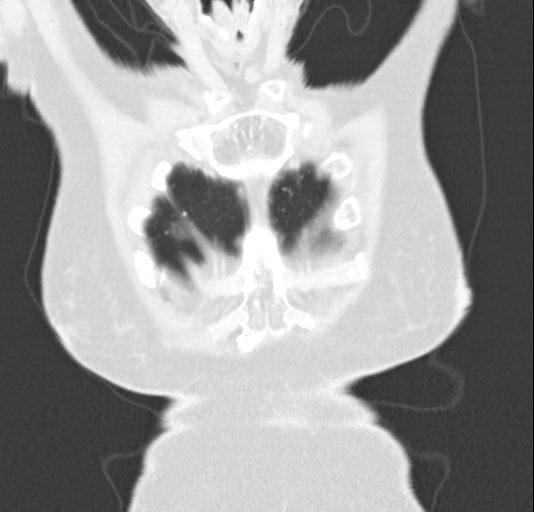
[im 63/158  lung]
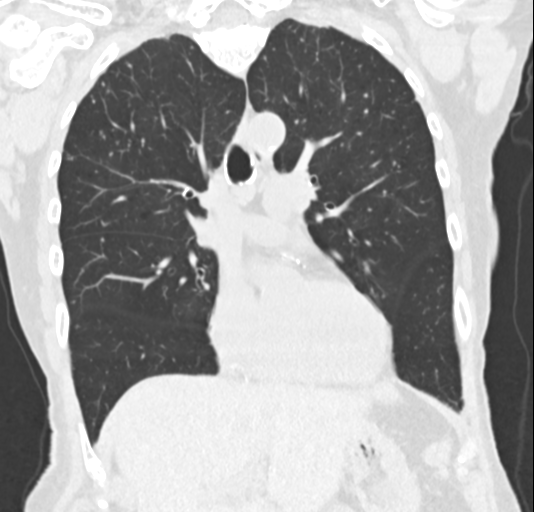
[im 95/158  lung]
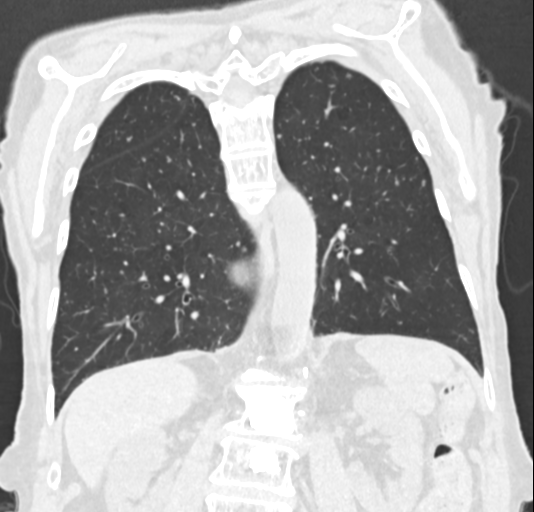

[15 of 36 positions shown; findings below may reference images not displayed]

FINDINGS: Cardiovascular: Heart is normal size. Tiny amount of pericardial
fluid is present. There is calcified plaque over the left main and 3
vessel coronary arteries. Thoracic aorta is normal in caliber. There
is mild calcified plaque over the thoracic aorta.

Mediastinum/Nodes: 1 cm precarinal lymph node is present. No other
mediastinal or hilar adenopathy. Remaining mediastinal structures
are unremarkable.

Lungs/Pleura: Lungs are well inflated without evidence of focal
airspace consolidation or effusion. There are numerous small
bilateral pulmonary nodules none greater than 4 mm in greatest
dimension. These are slightly more numerous over the mid to upper
lungs and peripherally. The nodules in the lung bases were not
present on the prior study from [LU]. Mild centrilobular and
paraseptal emphysematous disease. Airways are normal.

Upper Abdomen: Calcified plaque over the abdominal aorta. 1.5 cm
left renal cyst. No acute findings. Minimal diverticulosis of the
colon.

Musculoskeletal: Degenerative changes of the spine with exaggerated
kyphosis of the thoracolumbar junction due to a severe compression
fracture as these findings are unchanged. Post kyphoplasty of 4
consecutive levels over the lower thoracic/upper lumbar spine
unchanged. Severe compression fracture over the upper thoracic spine
post kyphoplasty.
IMPRESSION: 1.  No acute cardiopulmonary disease.

2. Numerous bilateral subcentimeter pulmonary nodules none greater
than 4 mm in size. Findings are likely due to an atypical infectious
or inflammatory process. Metastatic disease is possible if patient
has a known primary tumor. Recommend clinical correlation and
follow-up CT 3 months.

3. Aortic Atherosclerosis ([LU]-[LU]) and Emphysema ([LU]-[LU]).
Atherosclerotic coronary artery disease.

4.  1.5 cm left renal cyst.

5.  Multiple spinal compression fractures post kyphoplasty.

## 2020-04-05 ENCOUNTER — Other Ambulatory Visit: Payer: Medicare Other

## 2020-04-06 ENCOUNTER — Ambulatory Visit (INDEPENDENT_AMBULATORY_CARE_PROVIDER_SITE_OTHER): Payer: Medicare Other | Admitting: Family Medicine

## 2020-04-06 ENCOUNTER — Other Ambulatory Visit: Payer: Self-pay

## 2020-04-06 ENCOUNTER — Encounter: Payer: Self-pay | Admitting: Family Medicine

## 2020-04-06 VITALS — BP 134/78 | HR 78 | Temp 97.8°F | Ht 61.75 in | Wt 124.3 lb

## 2020-04-06 DIAGNOSIS — I700 Atherosclerosis of aorta: Secondary | ICD-10-CM | POA: Diagnosis not present

## 2020-04-06 DIAGNOSIS — M546 Pain in thoracic spine: Secondary | ICD-10-CM

## 2020-04-06 DIAGNOSIS — S32000S Wedge compression fracture of unspecified lumbar vertebra, sequela: Secondary | ICD-10-CM

## 2020-04-06 DIAGNOSIS — E871 Hypo-osmolality and hyponatremia: Secondary | ICD-10-CM

## 2020-04-06 DIAGNOSIS — M810 Age-related osteoporosis without current pathological fracture: Secondary | ICD-10-CM | POA: Diagnosis not present

## 2020-04-06 DIAGNOSIS — G8929 Other chronic pain: Secondary | ICD-10-CM | POA: Insufficient documentation

## 2020-04-06 DIAGNOSIS — I10 Essential (primary) hypertension: Secondary | ICD-10-CM

## 2020-04-06 DIAGNOSIS — M4850XA Collapsed vertebra, not elsewhere classified, site unspecified, initial encounter for fracture: Secondary | ICD-10-CM | POA: Insufficient documentation

## 2020-04-06 DIAGNOSIS — I2510 Atherosclerotic heart disease of native coronary artery without angina pectoris: Secondary | ICD-10-CM

## 2020-04-06 DIAGNOSIS — Z8781 Personal history of (healed) traumatic fracture: Secondary | ICD-10-CM | POA: Insufficient documentation

## 2020-04-06 DIAGNOSIS — R918 Other nonspecific abnormal finding of lung field: Secondary | ICD-10-CM | POA: Diagnosis not present

## 2020-04-06 NOTE — Assessment & Plan Note (Signed)
Reviewed Chest CT with numerous small nodules  No symptoms of infx or inflammatory dz Used to smoke  Per protocol will re check in 3 mo

## 2020-04-06 NOTE — Assessment & Plan Note (Signed)
bp in fair control at this time  BP Readings from Last 1 Encounters:  04/06/20 134/78   No changes needed Most recent labs reviewed  Disc lifstyle change with low sodium diet and exercise

## 2020-04-06 NOTE — Assessment & Plan Note (Signed)
Multiple levels lumbar and thoracic S/p kyphoplasty

## 2020-04-06 NOTE — Assessment & Plan Note (Signed)
Thoracic and less so lumbar  Suspect multi factorial with DDD and compression fx history  Is interested in PT Referral done

## 2020-04-06 NOTE — Assessment & Plan Note (Signed)
Noted comp fx of different ages on CT Taking prolia No recent falls

## 2020-04-06 NOTE — Assessment & Plan Note (Signed)
Enc to keep water intake under 50 oz daily

## 2020-04-06 NOTE — Assessment & Plan Note (Signed)
Seen on Ct of the chest  Cholesterol ratio is 3

## 2020-04-06 NOTE — Assessment & Plan Note (Signed)
Signs of coronary atherosclerosis seen on CT scan  No symptoms High HDL  Desires cardiology ref to discuss

## 2020-04-06 NOTE — Patient Instructions (Addendum)
I placed a referral for cardiology The office will call you to set up a visit   We will need to get another CT of the chest in 3 months  I will send a reminder to myself to order that closer to the due date   I also placed a referral to PT   Continue your prolia   Take care of yourself
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
## 2020-04-26 ENCOUNTER — Ambulatory Visit: Payer: Medicare Other

## 2020-04-28 ENCOUNTER — Ambulatory Visit (INDEPENDENT_AMBULATORY_CARE_PROVIDER_SITE_OTHER): Payer: Medicare Other

## 2020-04-28 DIAGNOSIS — M810 Age-related osteoporosis without current pathological fracture: Secondary | ICD-10-CM | POA: Diagnosis not present

## 2020-04-28 DIAGNOSIS — Q782 Osteopetrosis: Secondary | ICD-10-CM

## 2020-04-28 MED ORDER — DENOSUMAB 60 MG/ML ~~LOC~~ SOSY
60.0000 mg | PREFILLED_SYRINGE | Freq: Once | SUBCUTANEOUS | Status: AC
  Administered 2020-04-28: 60 mg via SUBCUTANEOUS

## 2020-04-28 NOTE — Progress Notes (Signed)
Pt given Prolia injection in left arm. Tolerated well. Gave pt sticker for her calendar to remind she is due in November 2021.

## 2020-05-03 ENCOUNTER — Ambulatory Visit (INDEPENDENT_AMBULATORY_CARE_PROVIDER_SITE_OTHER): Payer: Medicare Other | Admitting: Cardiovascular Disease

## 2020-05-03 ENCOUNTER — Other Ambulatory Visit: Payer: Self-pay

## 2020-05-03 ENCOUNTER — Encounter: Payer: Self-pay | Admitting: Cardiovascular Disease

## 2020-05-03 VITALS — BP 144/80 | HR 100 | Ht 61.75 in | Wt 131.0 lb

## 2020-05-03 DIAGNOSIS — I2510 Atherosclerotic heart disease of native coronary artery without angina pectoris: Secondary | ICD-10-CM | POA: Diagnosis not present

## 2020-05-03 DIAGNOSIS — I700 Atherosclerosis of aorta: Secondary | ICD-10-CM | POA: Diagnosis not present

## 2020-05-03 MED ORDER — ROSUVASTATIN CALCIUM 10 MG PO TABS
10.0000 mg | ORAL_TABLET | Freq: Every day | ORAL | 3 refills | Status: DC
Start: 2020-05-03 — End: 2021-06-05
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
## 2020-05-03 NOTE — Patient Instructions (Addendum)
Medication Instructions:  Your physician has recommended you make the following change in your medication:  START Rosuvastatin (Crestor) 10 mg once daily  *If you need a refill on your cardiac medications before your next appointment, please call your pharmacy*   Lab Work: Your physician recommends that you return for lab work in: 3 months  You will need to FAST for this appointment - nothing to eat or drink after midnight the night before except water.  If you have labs (blood work) drawn today and your tests are completely normal, you will receive your results only by: Marland Kitchen MyChart Message (if you have MyChart) OR . A paper copy in the mail If you have any lab test that is abnormal or we need to change your treatment, we will call you to review the results.   Testing/Procedures: Non-Cardiac CT scanning for Calcium Scoring, (CAT scanning), is a noninvasive, special x-ray that produces cross-sectional images of the body using x-rays and a computer. CT scans help physicians diagnose and treat medical conditions. CT scans provide greater clarity and reveal more details than regular x-ray exams.     Follow-Up: At [facility], you and your health needs are our priority.  As part of our continuing mission to provide you with exceptional heart care, we have created designated Provider Care Teams.  These Care Teams include your primary Cardiologist (physician) and Advanced Practice Providers (APPs -  Physician Assistants and Nurse Practitioners) who all work together to provide you with the care you need, when you need it.   Your next appointment:   1 year(s)  The format for your next appointment:   In Person  Provider:   You may see Mertie Moores, MD or one of the following Advanced Practice Providers on your designated Care Team:    Richardson Dopp, PA-C  [place], [place]

## 2020-05-09 ENCOUNTER — Other Ambulatory Visit: Payer: Self-pay

## 2020-05-09 ENCOUNTER — Ambulatory Visit (INDEPENDENT_AMBULATORY_CARE_PROVIDER_SITE_OTHER)
Admission: RE | Admit: 2020-05-09 | Discharge: 2020-05-09 | Disposition: A | Discharge status: Home / Self Care | Payer: Self-pay | Source: Ambulatory Visit | Attending: Cardiovascular Disease | Admitting: Cardiovascular Disease

## 2020-05-09 DIAGNOSIS — I2510 Atherosclerotic heart disease of native coronary artery without angina pectoris: Secondary | ICD-10-CM

## 2020-05-09 DIAGNOSIS — I700 Atherosclerosis of aorta: Secondary | ICD-10-CM

## 2020-05-09 IMAGING — CT CT CARDIAC CORONARY ARTERY CALCIUM SCORE
3 series · 14 of 20 positions shown, 15 images · non-contrast
Comparison: [DATE]
COMPARISON: [DATE]

Addendum:
EXAM:
OVER-READ INTERPRETATION  CT CHEST

The following report is an over-read performed by radiologist Dr.
JAE WAN [REDACTED] on [DATE]. This over-read
does not include interpretation of cardiac or coronary anatomy or
pathology. The coronary calcium score interpretation by the
cardiologist is attached.
CLINICAL DATA: Risk stratification
Coronary Calcium Score
TECHNIQUE: The patient was scanned on a Siemens Force scanner. Axial
non-contrast 3 mm slices were carried out through the heart. The
data set was analyzed on a dedicated work station and scored using
the Agatson method.

[Series 2: casc 3.0 bv41 2 bestsyst 34 % · axial · 0.30mm/px · z∈[-243,-171]mm · 4 of 42 slices shown, 5 images]
[im 9/42  vessel]
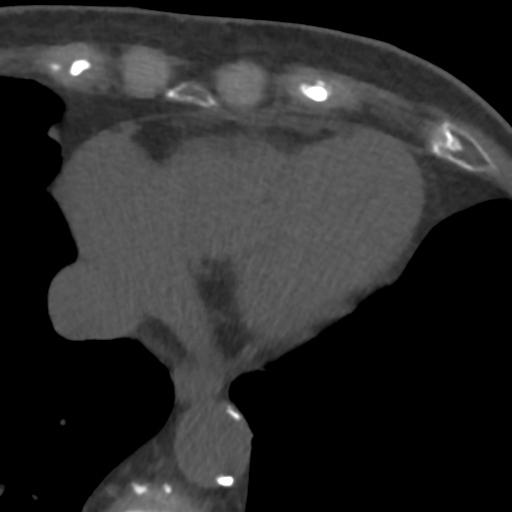
[im 9/42  lung]
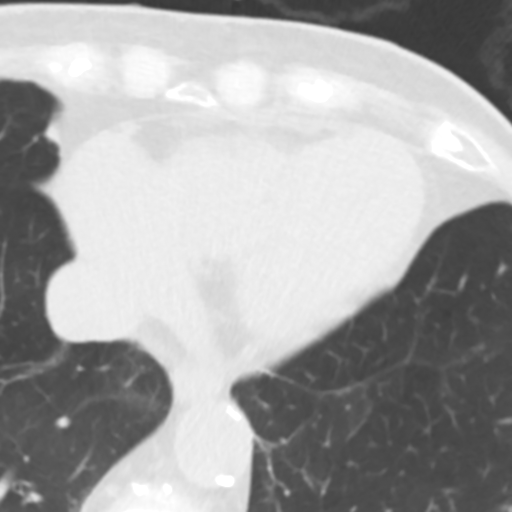
[im 17/42  vessel]
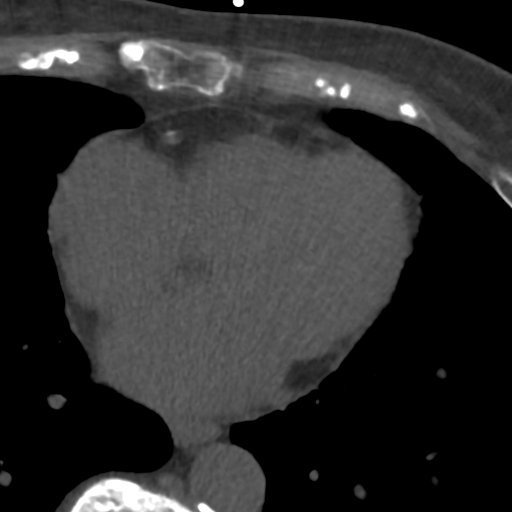
[im 25/42  vessel]
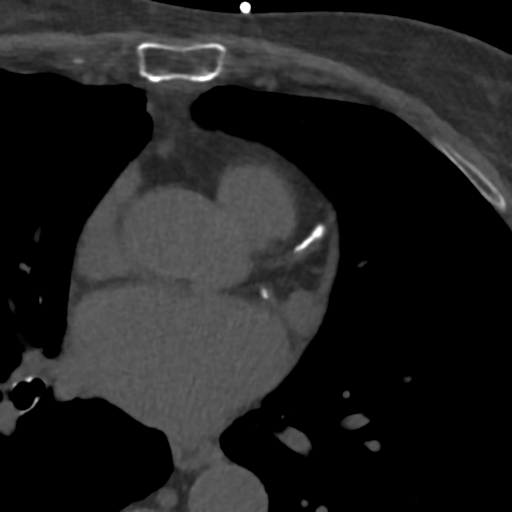
[im 33/42  vessel]
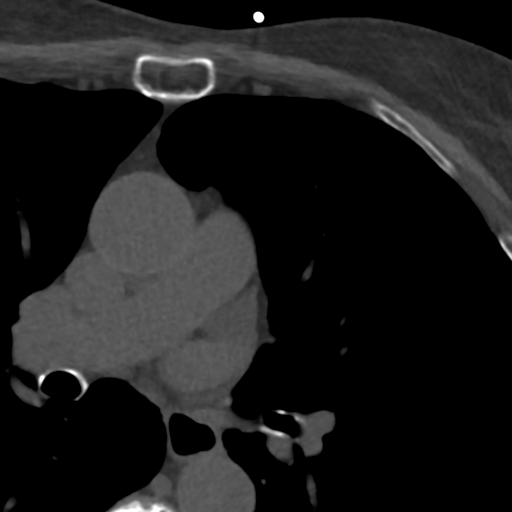

[Series 3: lung 35 % · axial · 0.63mm/px · z∈[-248,-164]mm · 5 of 43 slices shown]
[im 8/43  lung]
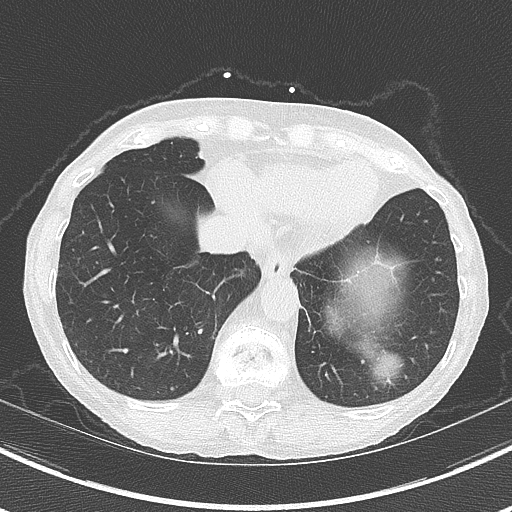
[im 15/43  lung]
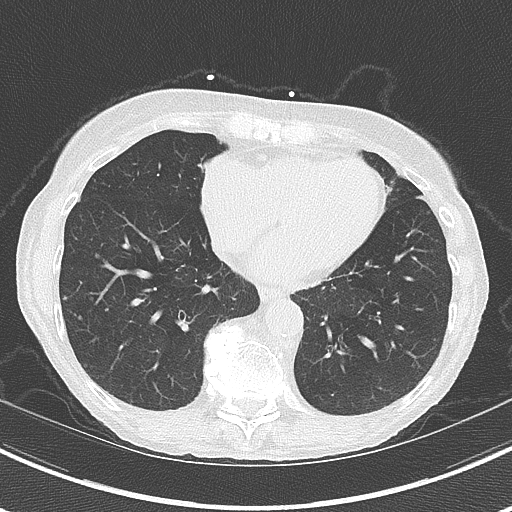
[im 22/43  lung]
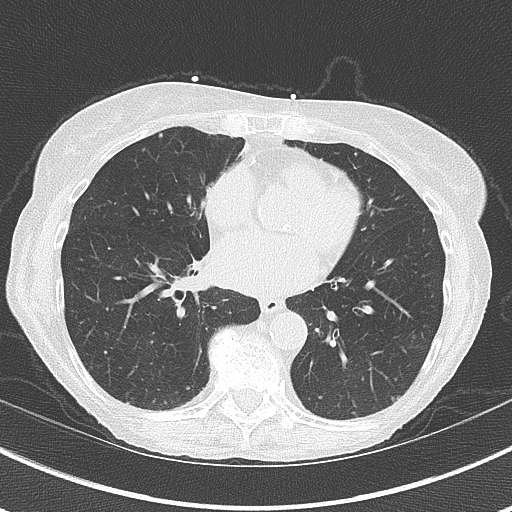
[im 29/43  lung]
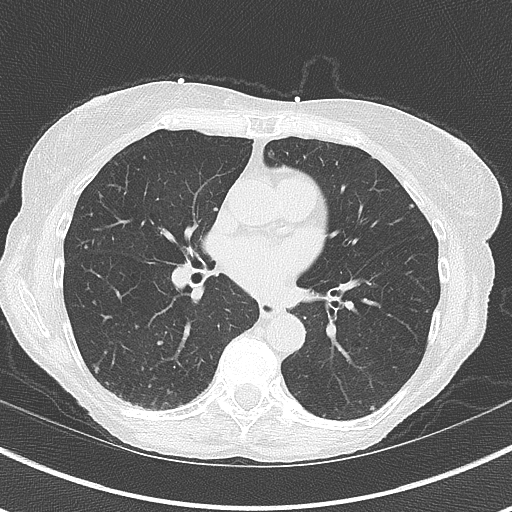
[im 36/43  lung]
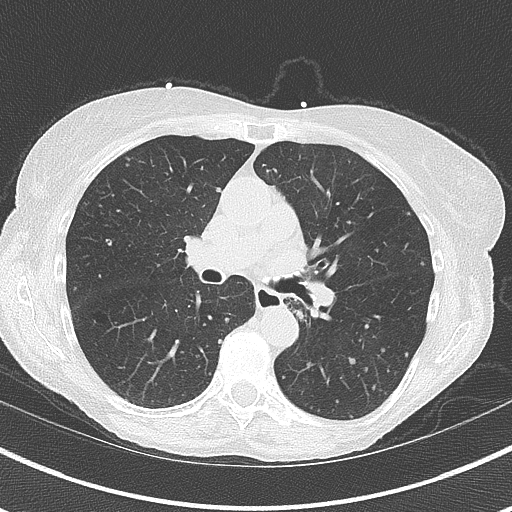

[Series 4: lung st 35 % · axial · 0.63mm/px · z∈[-248,-164]mm · 5 of 43 slices shown]
[im 8/43  lung]
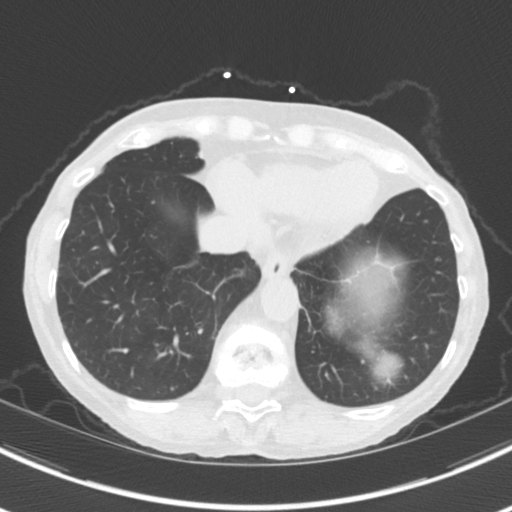
[im 15/43  lung]
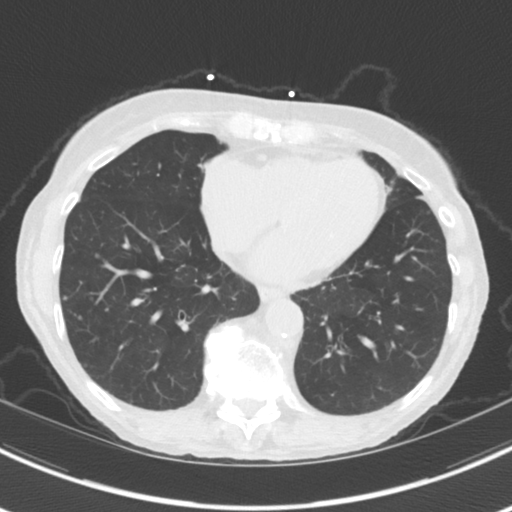
[im 22/43  lung]
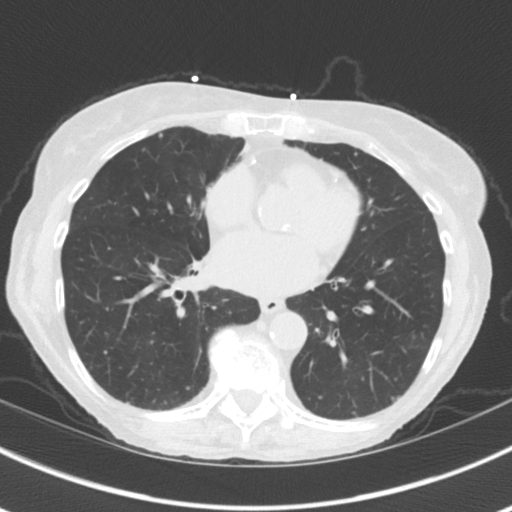
[im 29/43  lung]
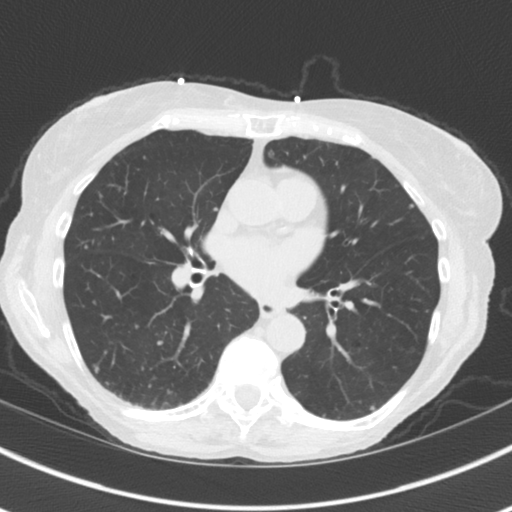
[im 36/43  lung]
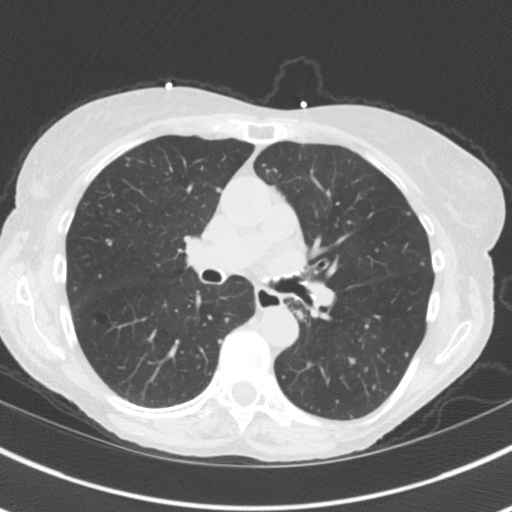

[14 of 20 positions shown; findings below may reference images not displayed]

FINDINGS: Vascular: Aortic atherosclerosis. No aneurysm. Heart is normal size.

Mediastinum/Nodes: No adenopathy. Borderline size precarinal lymph
node, 8 mm, decreased in size since prior study.

Lungs/Pleura: Numerous nodules are scattered throughout the lungs,
stable since prior study. These measure 4 mm or less in size. No
effusions.

Upper Abdomen: Imaging into the upper abdomen shows no acute
findings.

Musculoskeletal: Chest wall soft tissues are unremarkable. Severe
compression fracture in a lower thoracic vertebral body is
unchanged. No acute bony abnormality.
IMPRESSION: Innumerable small bilateral pulmonary nodules, 4 mm or less in size.
These are stable. Recommend follow-up in 3-6 months to assess
stability.

Aortic atherosclerosis.
FINDINGS: Non-cardiac: See separate report from [REDACTED].

Ascending Aorta: Normal Caliber.  Scattered calcifications.

Pericardium: Normal

Coronary arteries: Normal coronary origins.
IMPRESSION: Coronary calcium score of 532. This was 87th percentile for age and
sex matched control.

JAE WAN

*** End of Addendum ***
EXAM:
OVER-READ INTERPRETATION  CT CHEST

The following report is an over-read performed by radiologist Dr.
JAE WAN [REDACTED] on [DATE]. This over-read
does not include interpretation of cardiac or coronary anatomy or
pathology. The coronary calcium score interpretation by the
cardiologist is attached.
FINDINGS: Vascular: Aortic atherosclerosis. No aneurysm. Heart is normal size.

Mediastinum/Nodes: No adenopathy. Borderline size precarinal lymph
node, 8 mm, decreased in size since prior study.

Lungs/Pleura: Numerous nodules are scattered throughout the lungs,
stable since prior study. These measure 4 mm or less in size. No
effusions.

Upper Abdomen: Imaging into the upper abdomen shows no acute
findings.

Musculoskeletal: Chest wall soft tissues are unremarkable. Severe
compression fracture in a lower thoracic vertebral body is
unchanged. No acute bony abnormality.
IMPRESSION: Innumerable small bilateral pulmonary nodules, 4 mm or less in size.
These are stable. Recommend follow-up in 3-6 months to assess
stability.

Aortic atherosclerosis.

## 2020-05-10 ENCOUNTER — Telehealth: Payer: Self-pay | Admitting: Nurse Practitioner

## 2020-05-10 ENCOUNTER — Telehealth: Payer: Self-pay | Admitting: Cardiovascular Disease

## 2020-05-10 MED ORDER — NEBIVOLOL HCL 5 MG PO TABS
5.0000 mg | ORAL_TABLET | Freq: Every day | ORAL | 11 refills | Status: DC
Start: 2020-05-10 — End: 2020-05-10

## 2020-05-10 MED ORDER — BISOPROLOL FUMARATE 5 MG PO TABS: 5.0000 mg | ORAL_TABLET | Freq: Every day | ORAL | 11 refills | Status: DC

## 2020-05-10 NOTE — Telephone Encounter (Signed)
-----   Message from Thayer Headings, MD sent at 05/10/2020  5:41 AM EDT ----- Coronary calcium score of 532. This was 87th percentile for age and sex matched control.  She has been started on Rosuvastatin 10 mg a day   Multiple small bilateral pulmonary nodules, 4 mm or less in size.   Recommend referral to pulmonary nodule clinic for follow up

## 2020-05-10 NOTE — Telephone Encounter (Signed)
Received return call from patient who states Bystolic will cost her [id number] per month which she is not willing to pay. I spoke with Dr. Acie Fredrickson and he advised patient may take bisoprolol 5 mg once daily. I advised that this medication should be much cheaper. I asked her to call back to report how she is feeling after 1-2 weeks of therapy. She verbalized understanding and agreement with plan and thanked me for the call.

## 2020-05-10 NOTE — Telephone Encounter (Signed)
I have never seen swelling with rosuvastatin. I agree that it is more likely to be due to amlodipine. Lets DC amlodipine Start Bystolic 5 mg a day  Have her watch her salt closely .  We may need to add a low dose HCTZ if her bp remains elevated.   I'm fine with altering her dose of rosuvastatin as suggested by Raquel Rodriguez-guzman, RPH for now.   We can always titrate the dose up later.

## 2020-05-10 NOTE — Telephone Encounter (Signed)
Reviewed results with patient. She states she agrees with plan for aggressive lipid control but she has experienced bilateral leg swelling since starting rosuvastatin. I asked if the leg swelling started around the time she started amlodipine since this is a known common side effect. She states she has been much more aware of the swelling since starting rosuvastatin. States yesterday they were very swollen and painful and interfered with her ADLs. She asks about other options for lipid control but is also open to changing antihypertensives. I asked about monitoring of HR and BP and she has not been tracking this. At her office visit on 5/11, HR was 100 bpm. She does not know what her normal resting HR is but agrees to start tracking BP and HR at home. I offered her an appointment with Pharm D to discuss possible change in cholesterol treatment and/or change in antihypertensive. She is agreeable to plan but would also like to know if she should change something prior to coming in for another office visit and she would like to get Dr. Elmarie Shiley opinion on treatment plan. I advised that I will forward the message to our Pharm D team and to Dr. Acie Fredrickson for advice and call her back to discuss a plan. She thanked me for the call.  In regards to pulmonary nodules, patient has 3 month follow-up CT scheduled by PCP, Dr. Glori Bickers.

## 2020-05-10 NOTE — Telephone Encounter (Signed)
HOLD rosuvastatin for 3 days, then resume at 10mg  every other day. Continue amlodipine for now and schedule visit with PharmD for lipid and HTN management.  Please bring BP records and home BP device to appoiment.

## 2020-05-10 NOTE — Telephone Encounter (Signed)
Called patient to review advice with her.  She states she thought about the leg swelling after we hung up this morning and she does think it started prior to starting rosuvastatin. She would like to d/c amlodipine and start Bystolic 5 mg daily. She asked me to look up cost of Bystolic and I advised her on how to use GoodRx and also to call [facility] to check price before picking it up. She agrees to call back if Bystolic is not affordable for her. She states she does not want to hold rosuvastatin at this point and will wait to see if leg swelling improves with stopping amlodipine. She thanked me for the call.

## 2020-05-10 NOTE — Telephone Encounter (Signed)
   Pt is returning call from [place], transferred call

## 2020-05-18 ENCOUNTER — Ambulatory Visit (INDEPENDENT_AMBULATORY_CARE_PROVIDER_SITE_OTHER): Payer: Medicare Other | Admitting: Gastroenterology

## 2020-05-18 ENCOUNTER — Encounter: Payer: Self-pay | Admitting: Gastroenterology

## 2020-05-18 VITALS — BP 118/70 | HR 69 | Ht 62.0 in | Wt 131.0 lb

## 2020-05-18 DIAGNOSIS — K5909 Other constipation: Secondary | ICD-10-CM

## 2020-05-18 DIAGNOSIS — R140 Abdominal distension (gaseous): Secondary | ICD-10-CM
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
## 2020-05-18 NOTE — Patient Instructions (Signed)
Continue Citrucel twice daily.   Add Amitiza 8 mcg twice daily. Samples given today( x4 boxes). Call office for prescription if this works.  Follow-up in 2 months- earlier if needed.   If you are age 76 or older, your body mass index should be between 23-30. Your Body mass index is 23.96 kg/m. If this is out of the aforementioned range listed, please consider follow up with your Primary Care Provider.  If you are age 45 or younger, your body mass index should be between 19-25. Your Body mass index is 23.96 kg/m. If this is out of the aformentioned range listed, please consider follow up with your Primary Care Provider.    Thank you for choosing me and [facility].  Dr.Beavers

## 2020-05-20 ENCOUNTER — Telehealth: Payer: Self-pay | Admitting: Cardiovascular Disease

## 2020-05-20 NOTE — Telephone Encounter (Signed)
The leg swelling is actually most likely related to her amlodipine and it resolves overnight .  Have her continue to watch her salt and salty foods ( bacon, sausage, canned foods, take out foods, bologna, hot dogs.)  Elevate her legs periodically through the day ( higher than her heart)  No new suggestions at this time

## 2020-05-20 NOTE — Telephone Encounter (Signed)
Amlodipine is not listed on pts med list?

## 2020-05-20 NOTE — Telephone Encounter (Signed)
Pt c/o swelling: STAT is pt has developed SOB within 24 hours  1) How much weight have you gained and in what time span? Few pounds in a week  2) If swelling, where is the swelling located? Lower legs and feet up  3) Are you currently taking a fluid pill? no  4) Are you currently SOB? no  5) Do you have a log of your daily weights (if so, list)? No, states she went from 128 lbs to 131 lbs.  6) Have you gained 3 pounds in a day or 5 pounds in a week? Maybe 3 or 4 pounds in a week  7) Have you traveled recently? no  Patient states she has started swelling in her lower legs and feet. She states she believes she has gained 3-4 lbs in about 5 days. She states her swelling started when she started taking rosuvastatin (CRESTOR) 10 MG tablet and bisoprolol (ZEBETA) 5 MG tablet. She believes the swelling may be caused by the bisoprolol. She states she takes both once a day.

## 2020-05-20 NOTE — Telephone Encounter (Signed)
Pt denies other symptoms.  Denies increased salt in diet.  States she has always had slight swelling in her ankles but has noticed since starting Bisoprolol she has developed pitting edema.  Improves at night.  Wakes up with no swelling.  Has also noticed that she is thirstier than usual but feels it is related to the heat.  Currently not on a diuretic because of low sodium.  Advised I will send message to Dr. Acie Fredrickson for review.

## 2020-05-24 NOTE — Telephone Encounter (Signed)
Medications have been discontinued from patient's list Will call her early next week to assess her status

## 2020-05-24 NOTE — Telephone Encounter (Signed)
Will see if stopping the amlodipine and bystolic , leg elevation,  and compression hose seem to help with the leg edema  If her BP increases, we will consider adding diuretic

## 2020-05-24 NOTE — Telephone Encounter (Signed)
Returned call to patient who states she continues to have bilateral pitting edema in her legs. She was advised on 5/18 when she called initially with leg swelling to stop her amlodipine and start bisoprolol 5 mg. She called Friday to discuss treatment options and after that call, she stopped bisoprolol and restarted amlodipine. She states she thinks the leg swelling is worse since being on bisoprolol. Denies high sodium diet, states she is a vegetarian and cooks at home.  She admits she does not walk a lot and was unsure if this would make her leg swelling worse. Monitors BP and pulse at home. SBP is generally 130's and DBP 70-75. Reports best BP reading recently is 118/72 mmHg, pulse 69 bpm. She does not wear compression stockings and has difficulty elevating her legs above her heart. I advised her to d/c amlodipine and do not restart bisoprolol for the next week. Advised her to continue rosuvastatin (which she had initially contributed to her leg swelling) and losartan 100 mg and continue to monitor BP and pulse. Encouraged her to walk as much as possible during the day, that this will actually help her leg swelling and to elevated legs above level of the heart when sitting. Advised her to go to medical supply store and get compression knee highs. I am routing message to Dr. Acie Fredrickson for additional advice and she is aware I will call back if there are any additional orders. She was very grateful for the call and is aware that we will reconnect next week after she has held the medications for 1 week.

## 2020-05-31 NOTE — Telephone Encounter (Signed)
Called patient to see how she is feeling since stopping amlodipine and bystolic. She states the leg swelling has decreased slightly but has not completely resolved. She admits that she cannot tolerate wearing the compression stockings for very long. She states BP is a little higher and pulse has increased to 90-100 bpm range. I advised that Dr. Acie Fredrickson suggested having her take diuretic. She is agreeable to restart HCTZ but has an appointment with PCP tomorrow and would like to get Dr. Marliss Coots thoughts. . I advised she should review this advice with PCP tomorrow and have her BP and pulse checked in the office since she reports that she gets nervous every time she checks her BP at home. I advised that Bystolic or a different beta blocker can be restarted in the future if needed and advised her to call back with questions or concerns after her appointment with Dr. Glori Bickers. I gave her information on the [email or web address] also. Patient verbalized understanding and agreement with plan and thanked me for the call.

## 2020-06-01 ENCOUNTER — Other Ambulatory Visit: Payer: Self-pay

## 2020-06-01 ENCOUNTER — Encounter: Payer: Self-pay | Admitting: Family Medicine

## 2020-06-01 ENCOUNTER — Ambulatory Visit (INDEPENDENT_AMBULATORY_CARE_PROVIDER_SITE_OTHER): Payer: Medicare Other | Admitting: Family Medicine

## 2020-06-01 VITALS — BP 140/78 | HR 79 | Temp 96.8°F | Ht 61.75 in | Wt 131.0 lb

## 2020-06-01 DIAGNOSIS — E871 Hypo-osmolality and hyponatremia: Secondary | ICD-10-CM | POA: Diagnosis not present

## 2020-06-01 DIAGNOSIS — R600 Localized edema: Secondary | ICD-10-CM | POA: Diagnosis not present

## 2020-06-01 DIAGNOSIS — I10 Essential (primary) hypertension: Secondary | ICD-10-CM | POA: Diagnosis not present

## 2020-06-01 DIAGNOSIS — K5909 Other constipation: Secondary | ICD-10-CM | POA: Diagnosis not present

## 2020-06-01 DIAGNOSIS — I2510 Atherosclerotic heart disease of native coronary artery without angina pectoris: Secondary | ICD-10-CM | POA: Diagnosis not present

## 2020-06-01 MED ORDER — BISOPROLOL FUMARATE 5 MG PO TABS: 2.5000 mg | ORAL_TABLET | Freq: Every day | ORAL | 0 refills | Status: DC

## 2020-06-01 NOTE — Patient Instructions (Addendum)
You can give the bisoprolol another try -but I'm not sure how much it will worsen your ankle swelling  You can try 2.5 mg (1/2 of the 5 mg pill) daily   I placed a nephrology referral to check out your low sodium problem   Keep elevating legs Wear support hose for travel

## 2020-06-01 NOTE — Assessment & Plan Note (Signed)
BP: 140/78  Was better on bisoprolol (could not afford bystolic) but caused pedal edema (along with amlodipine) Cannot use diuretic due to hyponatremia   Pt plans to try 12.5 (half dose) of bisoprolol and see if it is better tolerated  Planning renal consult for hyponatremia and HTN

## 2020-06-01 NOTE — Assessment & Plan Note (Signed)
Reviewed most recent GI notes Pt cannot inc fluids due to hyponatremia  citrucel has been helpful Chronic bloating continues

## 2020-06-01 NOTE — Assessment & Plan Note (Signed)
Ongoing despite fluid restriction  Making it hard to tx HTN/ pedal edema and chronic constipation  Nephrology ref done

## 2020-06-01 NOTE — Assessment & Plan Note (Signed)
This was worsened by amlodipine and bisoprolol  Cannot use diuretic due to hyponatremia Not very tolerant of supp hose but will wear for travel  Leg elevation enc  Ref made to nephrology for this and low na and HTN
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
## 2020-06-07 ENCOUNTER — Telehealth: Payer: Self-pay | Admitting: *Deleted

## 2020-06-07 ENCOUNTER — Telehealth: Payer: Self-pay | Admitting: Cardiovascular Disease

## 2020-06-07 DIAGNOSIS — R918 Other nonspecific abnormal finding of lung field: Secondary | ICD-10-CM

## 2020-06-07 NOTE — Telephone Encounter (Signed)
-----   Message from Abner Greenspan, MD sent at 06/06/2020  8:26 PM EDT ----- Would pt like [place] to go ahead and work on scheduling her 3 months CT follow up for nodules?

## 2020-06-07 NOTE — Telephone Encounter (Signed)
Pt is okay with CT scan she said she doesn't think she's due until July, her scan was on 04/05/20, but will go whenever Dr. Glori Bickers gets it scheduled  Also pt did want me to let Dr. Glori Bickers know that she is going out of the country the 1st week in Aug right when her Ambien is due, she will have to get a refill about 4 days early due to her leaving. Pt advised to call back once refill is due and remind Dr. Glori Bickers that she is going out of the country and that's why she needs meds a little early. Pt said that she checked with the pharmacy and since it will not be extremely early just about 4-5 days they are okay filling it early as well. Pt will call back the last week of July to request med before leaving

## 2020-06-07 NOTE — Telephone Encounter (Signed)
Patient stated she cannot take bisoprolol due to ankle swelling. Informed patient that bisoprolol is not know for causing fluid retention. Patient stated she cannot take lasix or HCTZ either, that her body reacts to medications differently. Informed patient that her message would be sent to Dr. Acie Fredrickson and his nurse and they will get back to her next week. Patient agreed to plan, that this is fine.

## 2020-06-07 NOTE — Telephone Encounter (Signed)
° °  Pt c/o medication issue:  1. Name of Medication: bisoprolol (ZEBETA) 5 MG tablet  2. How are you currently taking this medication (dosage and times per day)? Take 0.5 tablets (2.5 mg total) by mouth daily.  3. Are you having a reaction (difficulty breathing--STAT)?   4. What is your medication issue? Pt said she spoke with her pcp and she said No to this drug because it makes pt retain fluid, she said if DR. Nahser nurse can call to discuss it  Please advise

## 2020-06-07 NOTE — Telephone Encounter (Signed)
I placed the order for CT in a month  The office will call   Thanks for the heads up re: px

## 2020-06-09 NOTE — Telephone Encounter (Signed)
Left detailed message for patient to call back. I advised that I have reviewed her notes from PCP visit and that Dr. Acie Fredrickson will not return to the office until next week and that if she prefers, she can wait until next week to call back. I advised her to monitor BP closely.

## 2020-06-14 ENCOUNTER — Telehealth: Payer: Self-pay | Admitting: Cardiovascular Disease

## 2020-06-14 ENCOUNTER — Telehealth: Payer: Self-pay | Admitting: Family Medicine

## 2020-06-14 DIAGNOSIS — H9190 Unspecified hearing loss, unspecified ear: Secondary | ICD-10-CM | POA: Insufficient documentation

## 2020-06-14 DIAGNOSIS — H9193 Unspecified hearing loss, bilateral: Secondary | ICD-10-CM

## 2020-06-14 MED ORDER — CARVEDILOL 3.125 MG PO TABS
3.1250 mg | ORAL_TABLET | Freq: Two times a day (BID) | ORAL | 0 refills | Status: DC
Start: 2020-06-14 — End: 2020-06-24

## 2020-06-14 NOTE — Telephone Encounter (Signed)
Returned call to patient who states her BP continues to be elevated and Dr. Glori Bickers does not want her to take any diuretics due to hx of hyponatremia. She has reported multiple drug intolerances. States the bisoprolol was effective in regards to lowering her pulse and BP but she thinks she developed leg swelling while taking it. We discussed starting carvedilol or metoprolol, which are similar medications but may not have the same side effects. Per Dr. Acie Fredrickson, will start carvedilol 3.125 mg twice daily. Pt requests only 1 week worth of medication in case it does not work well for her. I advised her to monitor BP, pulse, and side effects and call us back to let us know how she is tolerating. I advised her also to limit sodium to 2000 mg daily, elevate legs above level of heart when sitting, and to use compression stockings for leg swelling.  She verbalized understanding and agreement with plan and thanked me for the call.

## 2020-06-14 NOTE — Telephone Encounter (Signed)
Patient returning call.

## 2020-06-14 NOTE — Telephone Encounter (Signed)
Patient called today Wendy [name] stated that Wendy [name] would like to move forward with the referral to audiology for her hearing. Patient would prefer a location in [place]   Advised Dr Glori Bickers was out of office this week and pt voiced understanding, that Wendy [name] is in no hurry

## 2020-06-14 NOTE — Telephone Encounter (Signed)
error 

## 2020-06-14 NOTE — Telephone Encounter (Signed)
Referral done Will route to [facility]  

## 2020-06-24 NOTE — Telephone Encounter (Signed)
Called patient to assess how she is feeling on carvedilol. She reports that she is feeling unsteady on her feet and having constipation since starting carvedilol. Has only been taking 1 dose per day; states the twice daily dose was too sedating for her.  BP 128/81, HR is 100 most of the time when she is active, lower when she rests. She agrees to d/c carvedilol and continue working on walking regularly for exercise and doing yoga. I advised her to monitor BP and HR and to call back if SBP consistently >140 mmHg and HR consistently > 100 bpm. I discussed prn propranolol with her and she will consider it for the future if HR/BP remained elevated. She verbalized understanding and agreement with plan and thanked me for the call.

## 2020-07-07 ENCOUNTER — Ambulatory Visit
Admission: RE | Admit: 2020-07-07 | Discharge: 2020-07-07 | Disposition: A | Discharge status: Home / Self Care | Payer: Medicare Other | Source: Ambulatory Visit | Attending: Family Medicine | Admitting: Family Medicine

## 2020-07-07 DIAGNOSIS — R918 Other nonspecific abnormal finding of lung field: Secondary | ICD-10-CM

## 2020-07-07 IMAGING — CT CT CHEST W/O CM
2 of 4 series · 12 of 36 positions shown, 15 images · non-contrast
Comparison: [DATE], [DATE]

CLINICAL DATA: Lung nodule less than 6 mm.

EXAM:
CT CHEST WITHOUT CONTRAST
TECHNIQUE: Multidetector CT imaging of the chest was performed following the
standard protocol without IV contrast.

[Series 2: chest 2.00 br40 s3 · axial · 0.50mm/px · z∈[+1478,+1742]mm · 9 of 154 slices shown, 12 images (1 of 2)]
[im 11/154  mediastinal]
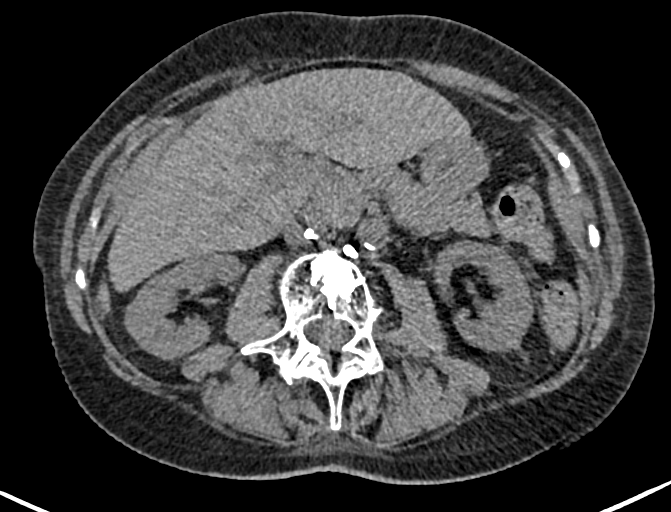
[im 11/154  lung]
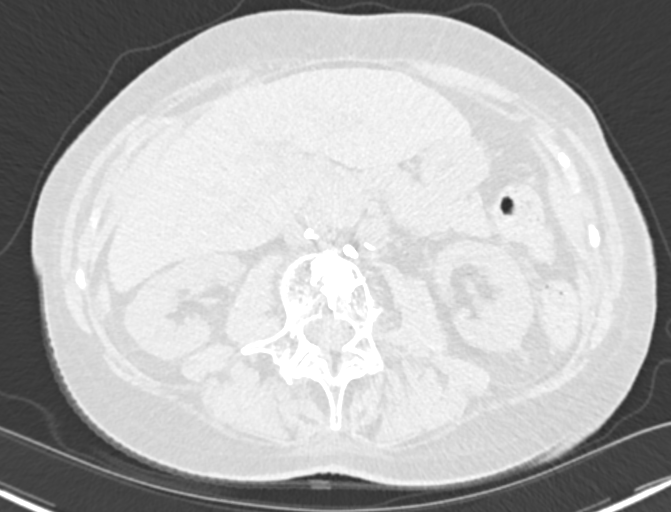
[im 33/154  lung]
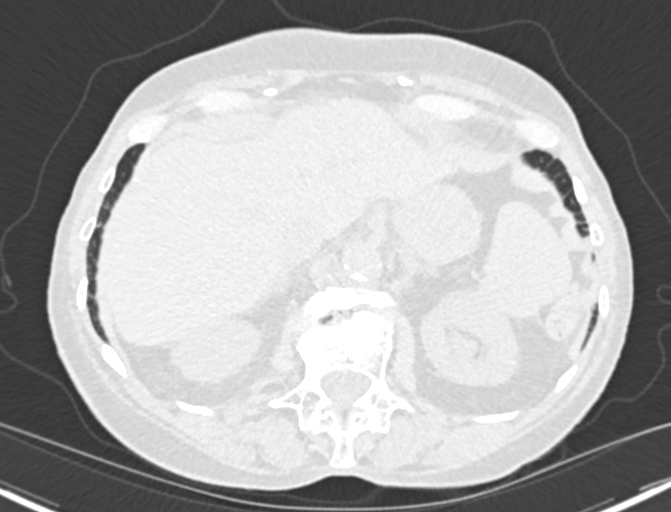
[im 44/154  lung]
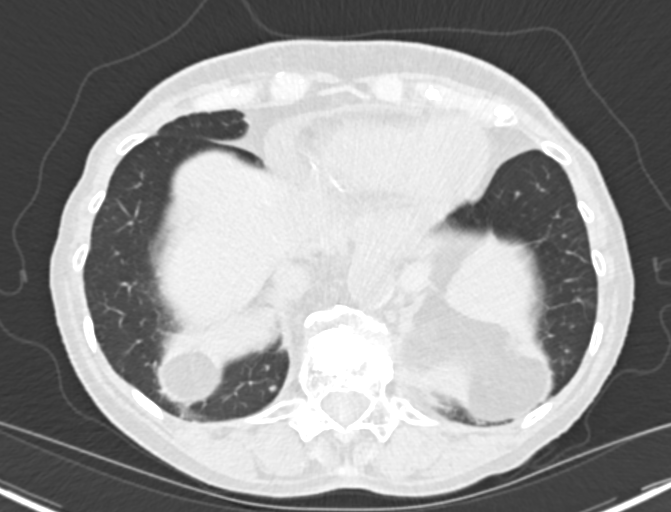
[im 66/154  lung]
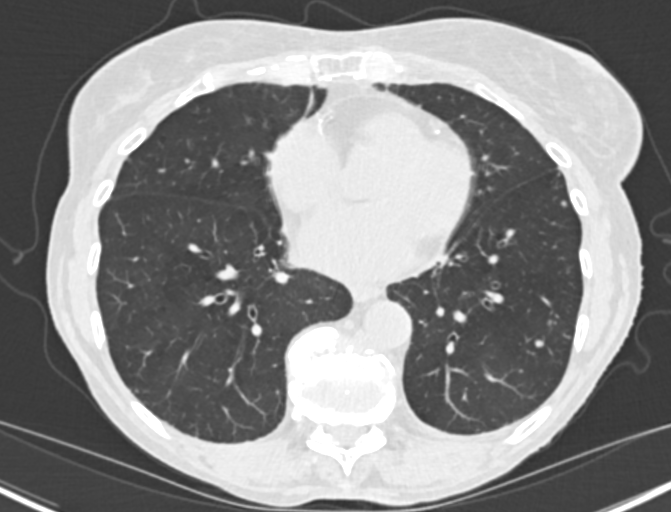
[im 77/154  mediastinal]
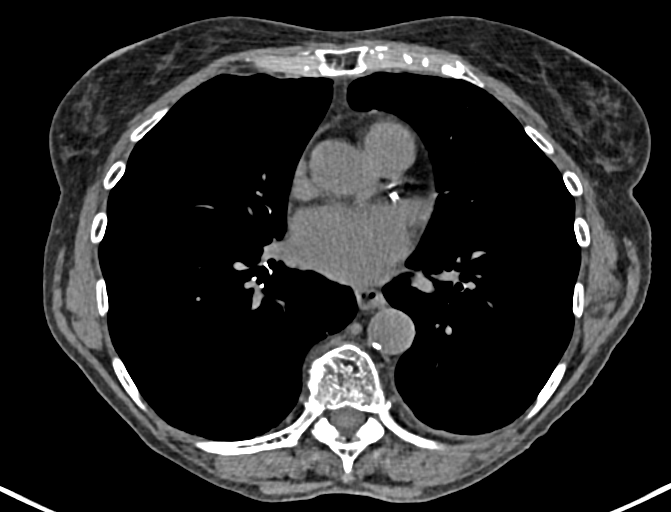
[im 77/154  lung]
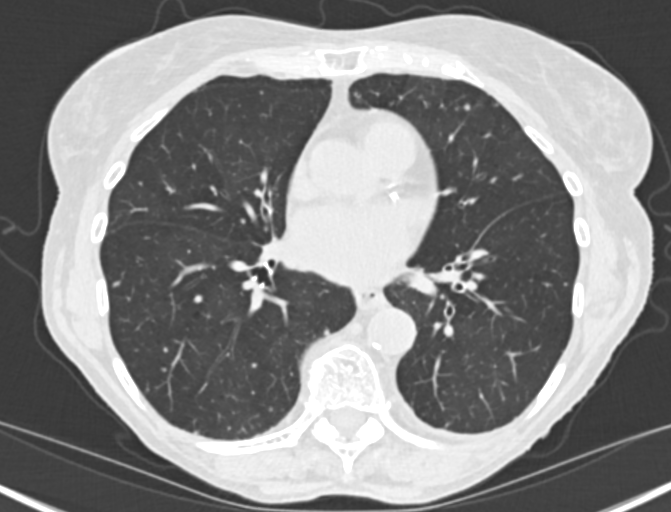
[im 88/154  lung]
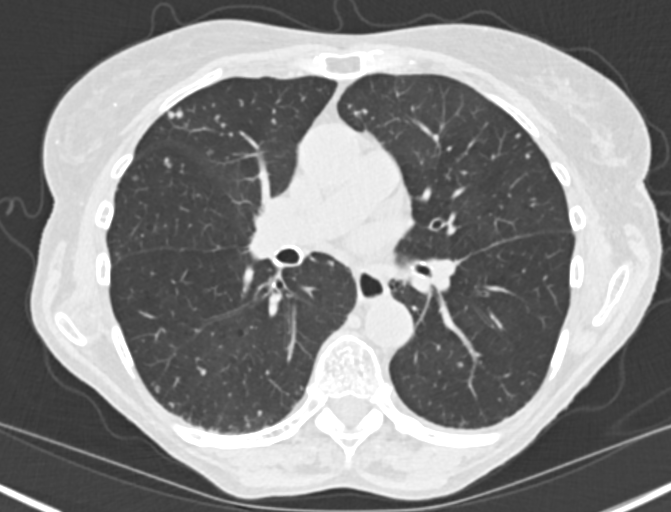
[im 110/154  lung]
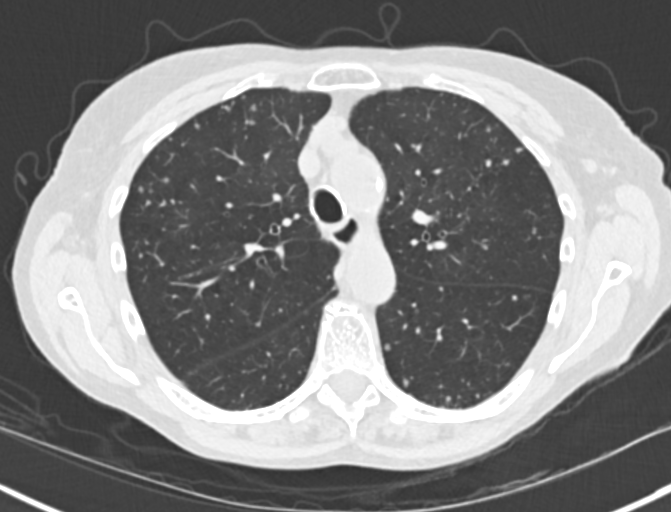
[im 121/154  lung]
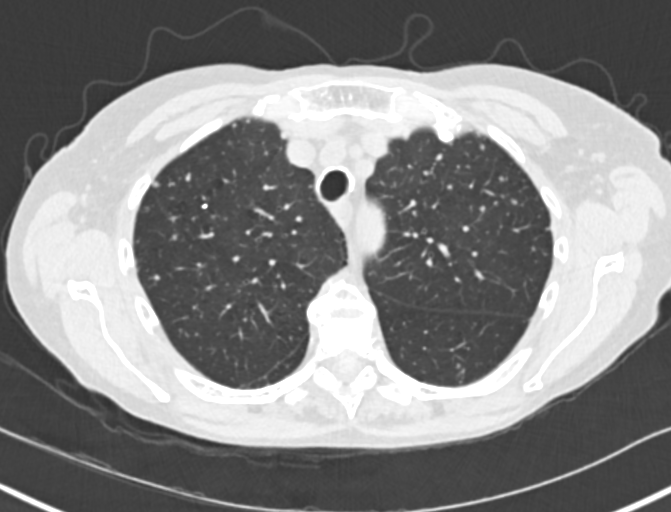
[im 143/154  mediastinal]
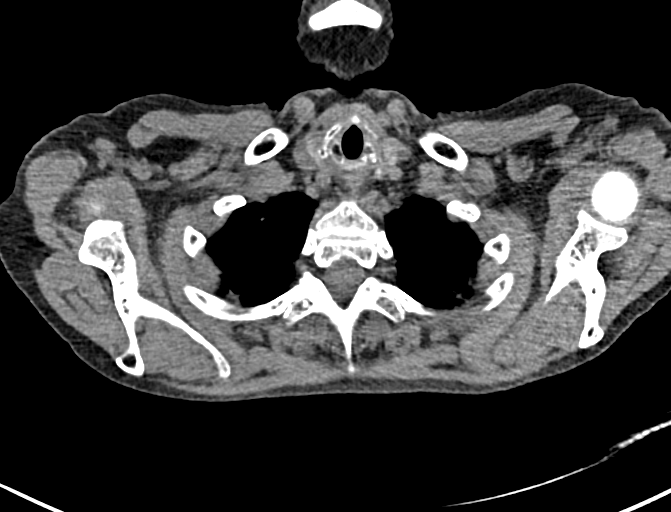
[im 143/154  lung]
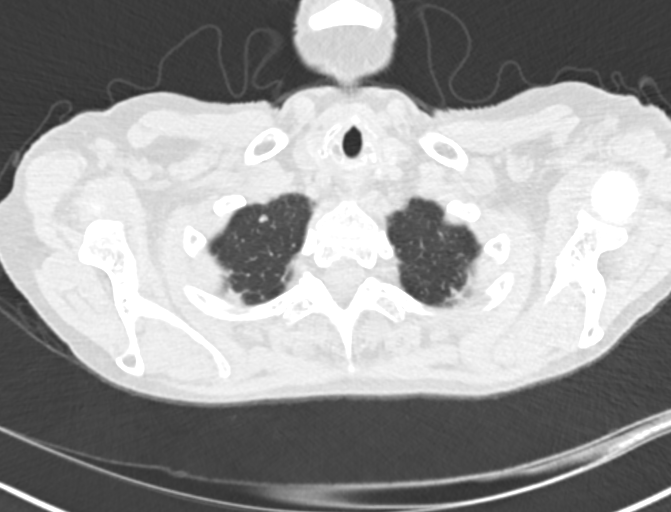

[Series 4: chest 2.00 br40 s3 · coronal · 0.61mm/px · 3 of 128 slices shown (2 of 2)]
[im 26/128  lung]
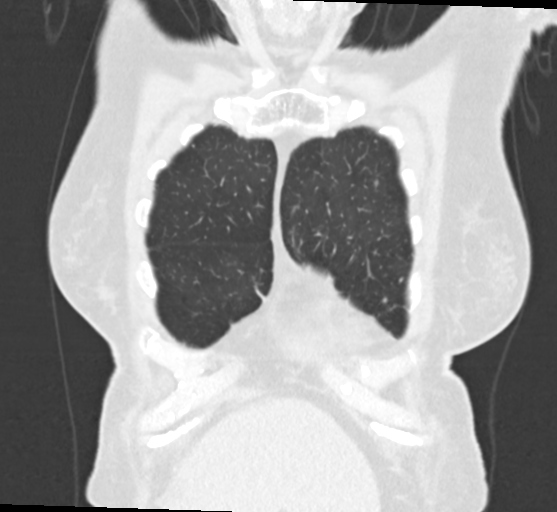
[im 51/128  lung]
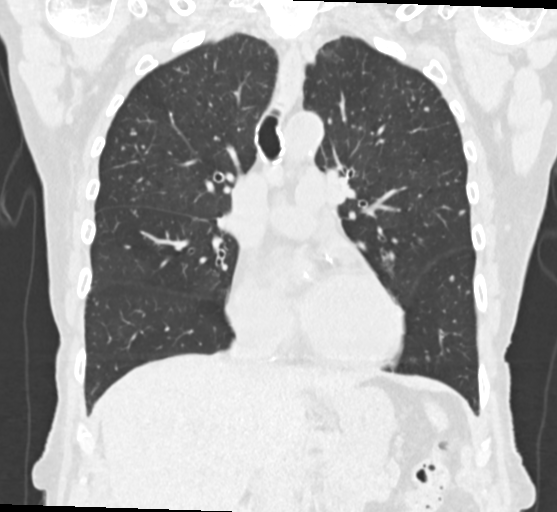
[im 77/128  lung]
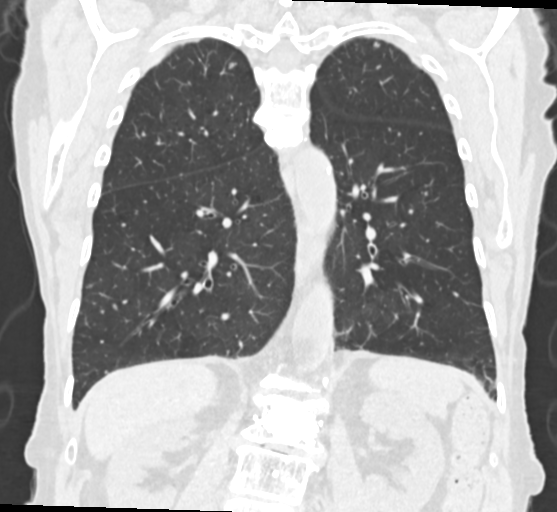

[12 of 36 positions shown; findings below may reference images not displayed]

FINDINGS: Cardiovascular: Coronary artery calcification and aortic
atherosclerotic calcification.

Mediastinum/Nodes: No axillary supraclavicular adenopathy no
mediastinal are. Pericardial fluid. Esophagus normal.

Lungs/Pleura: There are numerous bilateral small of pulmonary
nodules which are not changed in size or number compared to prior CT
[DATE]. The nodules are 5 mm or less in a peripheral pattern of
distribution within all lobes. Findings are typical of benign
noncalcified granulomas however nodules are new from [EI].

Example nodule in the medial RIGHT lower lobe measures 4 mm compared
to 4 mm (image 112/series 8).

Example nodule in the RIGHT upper lobe measures 3 mm mm (image 18)
compared to 3 mm.

LEFT upper lobe nodule measuring 3 mm image 28 compared to 3 mm.

Upper Abdomen: Limited view of the liver, kidneys, pancreas are
unremarkable. Normal adrenal glands.

Musculoskeletal: Multiple compression fractures which are chronic
and severe. Multiple vertebroplasty augmentation levels.
IMPRESSION: 1. Stable small bilateral peripheral pulmonary nodules favor benign
infectious or inflammatory process. Nodule stable over 3 month
interval is reassuring. As nodules were new from abdomen CT scan
[EI], recommend follow-up CT in 1 year time to demonstrate long-term
stability.
2. Coronary artery calcification and Aortic Atherosclerosis
([EI]-[EI]).

## 2020-07-11 ENCOUNTER — Telehealth: Payer: Self-pay | Admitting: *Deleted

## 2020-07-11 NOTE — Telephone Encounter (Signed)
Left VM requesting pt to call the office back regarding CT results 

## 2020-07-12 NOTE — Telephone Encounter (Signed)
Addressed through result notes  

## 2020-07-14 ENCOUNTER — Encounter: Payer: Self-pay | Admitting: Family Medicine

## 2020-07-19 ENCOUNTER — Encounter: Payer: Self-pay | Admitting: Gastroenterology

## 2020-07-19 ENCOUNTER — Ambulatory Visit (INDEPENDENT_AMBULATORY_CARE_PROVIDER_SITE_OTHER): Payer: Medicare Other | Admitting: Gastroenterology

## 2020-07-19 ENCOUNTER — Other Ambulatory Visit: Payer: Self-pay | Admitting: Family Medicine

## 2020-07-19 VITALS — BP 128/78 | HR 96 | Ht 62.0 in | Wt 128.1 lb

## 2020-07-19 DIAGNOSIS — K5909 Other constipation: Secondary | ICD-10-CM

## 2020-07-19 DIAGNOSIS — R140 Abdominal distension (gaseous): Secondary | ICD-10-CM | POA: Diagnosis not present

## 2020-07-19 MED ORDER — ZOLPIDEM TARTRATE 10 MG PO TABS: ORAL_TABLET | ORAL | 0 refills | Status: DC

## 2020-07-19 NOTE — Telephone Encounter (Signed)
Patient called.  Patient is leaving for the [place] on Monday.  Patient's requesting a early refill for Zolpidem sent to [facility]. Patient's requesting the refill be sent in by tomorrow, so patient can pick it up on Saturday. Patient's requesting a full refill or 5 days.  [facility] said they would fill either.

## 2020-07-19 NOTE — Patient Instructions (Addendum)
I hope that you have a great trip. You may try to substitute Miralax for Citrucel to avoid constipation while traveling. You may use Miralax up to 3 times daily as needed.   You may want to consider using kiwi or prune juice for a natural way to treat your constipation as you travel if the Miralax is not providing complete relief.   When you return, let's try Amitiza 8 mcg daily. After one week, increase to 8 mcg twice daily.  Let's plan to follow-up after you have a chance to try to Amitiza on your return.   Follow up in 2-3 months, earlier if needed.  I appreciate the opportunity to care for you. [place], MD, MPH
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
## 2020-07-20 ENCOUNTER — Telehealth: Payer: Self-pay | Admitting: *Deleted

## 2020-07-20 NOTE — Telephone Encounter (Signed)
PA for pt's ambien done at [email or web address], I will await a response

## 2020-07-21 NOTE — Telephone Encounter (Signed)
Pt going to [place] on 07/25/20 and pt wants to pick up zolpidem and pay out of pocket on 07/23/20. I spoke with [place] CMA and Dr Glori Bickers had already oked early pick up of zolpidem since pt will be out of country when refill is due. (see 06/14/20 phone note also.) pt last picked up Zolpidem 10 mg # 30 on 06/28/20. I spoke with Lennette Bihari pharmacist at [facility] and he will go ahead and get rx ready for pick up. Pt voiced understanding and appreciative.

## 2020-07-27 ENCOUNTER — Telehealth: Payer: Self-pay

## 2020-07-27 NOTE — Telephone Encounter (Signed)
Error

## 2020-08-01 ENCOUNTER — Ambulatory Visit (INDEPENDENT_AMBULATORY_CARE_PROVIDER_SITE_OTHER): Payer: Medicare Other | Admitting: Orthopaedic Surgery

## 2020-08-01 ENCOUNTER — Encounter: Payer: Self-pay | Admitting: Orthopaedic Surgery

## 2020-08-01 DIAGNOSIS — I2510 Atherosclerotic heart disease of native coronary artery without angina pectoris: Secondary | ICD-10-CM

## 2020-08-01 DIAGNOSIS — S82035A Nondisplaced transverse fracture of left patella, initial encounter for closed fracture: Secondary | ICD-10-CM

## 2020-08-01 MED ORDER — TRAMADOL HCL 50 MG PO TABS: 50.0000 mg | ORAL_TABLET | Freq: Four times a day (QID) | ORAL | 0 refills | Status: DC | PRN
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
## 2020-08-08 ENCOUNTER — Other Ambulatory Visit: Payer: Medicare Other

## 2020-08-08 ENCOUNTER — Encounter: Payer: Self-pay | Admitting: Orthopaedic Surgery

## 2020-08-08 ENCOUNTER — Ambulatory Visit (INDEPENDENT_AMBULATORY_CARE_PROVIDER_SITE_OTHER): Payer: Medicare Other | Admitting: Orthopaedic Surgery

## 2020-08-08 ENCOUNTER — Ambulatory Visit (INDEPENDENT_AMBULATORY_CARE_PROVIDER_SITE_OTHER): Payer: Medicare Other

## 2020-08-08 DIAGNOSIS — S82035A Nondisplaced transverse fracture of left patella, initial encounter for closed fracture: Secondary | ICD-10-CM

## 2020-08-08 DIAGNOSIS — S82035D Nondisplaced transverse fracture of left patella, subsequent encounter for closed fracture with routine healing: Secondary | ICD-10-CM

## 2020-08-08 NOTE — Progress Notes (Signed)
The patient is now a week and a half status post a mechanical fall in which she sustained a comminuted patella fracture of the left knee.  She is now in a Bledsoe hinged knee brace locked in full extension.  She is putting full weight on her leg.  She is using crutches.  She is 74 years old.  On exam clinically the patella appears to be in 1 place.  I did not test her extensor mechanism since his only been just 2 weeks.  2 views of the left patella are obtained and show the fracture is comminuted but it is not pulled apart.  I will keep her a full extended position for the next 2 weeks with her left knee.  I explained in detail that we do not want her flexing her knee at all.  We see her in 2 weeks I would like a supine lateral view for a single view of the left knee out of the brace.

## 2020-08-17 ENCOUNTER — Other Ambulatory Visit: Payer: Self-pay | Admitting: Orthopaedic Surgery

## 2020-08-17 NOTE — Telephone Encounter (Signed)
Please advise 

## 2020-08-22 ENCOUNTER — Encounter: Payer: Self-pay | Admitting: Orthopaedic Surgery

## 2020-08-22 ENCOUNTER — Ambulatory Visit (INDEPENDENT_AMBULATORY_CARE_PROVIDER_SITE_OTHER): Payer: Medicare Other | Admitting: Orthopaedic Surgery

## 2020-08-22 ENCOUNTER — Ambulatory Visit (INDEPENDENT_AMBULATORY_CARE_PROVIDER_SITE_OTHER): Payer: Medicare Other

## 2020-08-22 DIAGNOSIS — S82035D Nondisplaced transverse fracture of left patella, subsequent encounter for closed fracture with routine healing: Secondary | ICD-10-CM | POA: Diagnosis not present

## 2020-08-22 NOTE — Progress Notes (Signed)
The patient is right at 4 weeks out from a left patella fracture.  This is a comminuted fracture with no significant displacement.  She is 74 years old.  We have had her in a Bledsoe hinged knee brace locked in full extension.  She is doing well with ambulating with crutches.  On exam the patella itself appears to be intact but I can feel just a small gap on the anterior aspect of the patella.  2 views of the left knee are obtained and show the patella fracture is still the still together on the lateral view.  We still prefer to treat this nonoperative since it is not significant displaced.  I am going to open up her knee brace for just 0 to 35 degrees and will keep her that way for the next 3 weeks.  I would like to see her back in 3 weeks with a supine view of the left knee with a single view which should be lateral only.

## 2020-08-24 ENCOUNTER — Other Ambulatory Visit: Payer: Self-pay | Admitting: Family Medicine

## 2020-08-25 NOTE — Telephone Encounter (Signed)
Name of Medication: Ambien Name of Pharmacy: [facility] or Written Date and Quantity: 07/19/20 #30 tabs 0 refills Last Office Visit and Type: BP/ Constipation issues on 06/01/20 Next Office Visit and Type: CPE on 10/19/20 Last Controlled Substance Agreement Date: 05/27/13 Last UDS:05/27/13

## 2020-09-05 ENCOUNTER — Other Ambulatory Visit: Payer: Medicare Other

## 2020-09-12 ENCOUNTER — Ambulatory Visit (INDEPENDENT_AMBULATORY_CARE_PROVIDER_SITE_OTHER): Payer: Medicare Other | Admitting: Orthopaedic Surgery

## 2020-09-12 ENCOUNTER — Telehealth: Payer: Self-pay

## 2020-09-12 ENCOUNTER — Ambulatory Visit: Payer: Self-pay

## 2020-09-12 ENCOUNTER — Encounter: Payer: Self-pay | Admitting: Orthopaedic Surgery

## 2020-09-12 DIAGNOSIS — S82035D Nondisplaced transverse fracture of left patella, subsequent encounter for closed fracture with routine healing: Secondary | ICD-10-CM

## 2020-09-12 NOTE — Progress Notes (Signed)
The patient is an active 74 year old female who is now 7 weeks status post a patella fracture of her left knee.  Although it was comminuted the pieces remain together.  With that her in a hinged knee brace locked in full extension for the first several weeks and the last time I saw her opened up from 0 to 30 degrees.  She is doing well overall.  On exam the patella itself feels like it is congruent and together.  She is able to hold her knee fully extended on her own.  A lateral view of the left knee shows the patella still comminuted but the pieces are together and have not pulled apart at all.  There has been interval healing.  I would like to open her brace from 0 to 60 degrees.  We will see her back in 2 weeks to get a repeat lateral view of the left knee in a supine position out of the brace.  We will then may transition to a smaller hinged knee brace at that visit.  All question concerns were answered and addressed.

## 2020-09-12 NOTE — Telephone Encounter (Signed)
Pending in [place]  Last injection 04-28-2020 Will need labs

## 2020-09-21 ENCOUNTER — Other Ambulatory Visit: Payer: Self-pay | Admitting: Family Medicine

## 2020-09-22 NOTE — Telephone Encounter (Signed)
Name of Medication: Ambien Name of Pharmacy: [facility] or Written Date and Quantity: 08/25/20 #30 tabs 0 refills Last Office Visit and Type: BP/ Constipation issues on 06/01/20 Next Office Visit and Type: CPE on 10/19/20 Last Controlled Substance Agreement Date: 05/27/13 Last UDS:05/27/13

## 2020-09-26 ENCOUNTER — Ambulatory Visit: Payer: Medicare Other

## 2020-09-26 ENCOUNTER — Encounter: Payer: Self-pay | Admitting: Orthopaedic Surgery

## 2020-09-26 ENCOUNTER — Ambulatory Visit (INDEPENDENT_AMBULATORY_CARE_PROVIDER_SITE_OTHER): Payer: Medicare Other | Admitting: Orthopaedic Surgery

## 2020-09-26 DIAGNOSIS — S82035D Nondisplaced transverse fracture of left patella, subsequent encounter for closed fracture with routine healing: Secondary | ICD-10-CM | POA: Diagnosis not present

## 2020-09-26 NOTE — Progress Notes (Signed)
The patient is a 74 year old female who is now exactly 9 weeks status post a left patella fracture.  We treated this nonoperatively with a Bledsoe hinged knee brace.  She is doing well overall.  On exam the patella on palpation appears to be a single unit.  She can easily extend her knee and hold an extended position.  I had her flex her knee to about 70 degrees and then she is able to extend it on her own.  A single lateral view of the left patella shows significant consolidation of the fracture.  I will transition her to a basic knee brace now to only wear when she is ambulating.  She will work on quad strengthening exercises and still not try to flex her knee past 90 degrees.  She does not need to sleep in this brace.  She can transition away from crutches as she feels comfortable and can full weight-bear as tolerated on the left knee.  All questions and concerns were answered and addressed.  I would like to see her back in 3 weeks for a final visit to determine whether or not she would need outpatient therapy.  We do not need to x-ray her at that visit.

## 2020-09-30 NOTE — Telephone Encounter (Signed)
Zero dollars for pt cost. PA approved through [facility] from 09/29/20 to 09/29/21. AUTH #:  [id number] ID#:  [id number]  Need to contact pt to schedule for lab and NV for inj.

## 2020-10-06 ENCOUNTER — Other Ambulatory Visit: Payer: Self-pay

## 2020-10-06 DIAGNOSIS — Z79899 Other long term (current) drug therapy: Secondary | ICD-10-CM

## 2020-10-06 NOTE — Telephone Encounter (Signed)
  Benefits submitted: Yes   Prior authorization needed: Yes auth received #[id number] exp 09/29/2021  Out of pocket for patient: $0   Lab appointment : 10/21  Nurse visit:  Added to AWV with provider note in app 10/27  Lab order placed: Yes

## 2020-10-07 NOTE — Telephone Encounter (Signed)
Last inj was 04/28/20 so her next prolia inj should be after 10/30/20. She can keep lab apt on 10/13/20 but prolia inj cannot happen at [facility] on 10/27. This is too early. Pt iwll need to come back for NV after 10/30/20.

## 2020-10-11 ENCOUNTER — Telehealth: Payer: Self-pay | Admitting: Family Medicine

## 2020-10-11 DIAGNOSIS — R739 Hyperglycemia, unspecified: Secondary | ICD-10-CM

## 2020-10-11 DIAGNOSIS — I10 Essential (primary) hypertension: Secondary | ICD-10-CM

## 2020-10-11 DIAGNOSIS — M810 Age-related osteoporosis without current pathological fracture: Secondary | ICD-10-CM

## 2020-10-11 DIAGNOSIS — E871 Hypo-osmolality and hyponatremia: Secondary | ICD-10-CM

## 2020-10-11 NOTE — Telephone Encounter (Signed)
-----   Message from Ellamae Sia sent at 09/27/2020  3:47 PM EDT ----- Regarding: Lab orders for Thursday, 10.21.21  AWV lab orders, please.

## 2020-10-13 ENCOUNTER — Other Ambulatory Visit (INDEPENDENT_AMBULATORY_CARE_PROVIDER_SITE_OTHER): Payer: Medicare Other

## 2020-10-13 ENCOUNTER — Other Ambulatory Visit: Payer: Self-pay

## 2020-10-13 DIAGNOSIS — R739 Hyperglycemia, unspecified: Secondary | ICD-10-CM | POA: Diagnosis not present

## 2020-10-13 DIAGNOSIS — E871 Hypo-osmolality and hyponatremia: Secondary | ICD-10-CM | POA: Diagnosis not present

## 2020-10-13 DIAGNOSIS — I10 Essential (primary) hypertension: Secondary | ICD-10-CM

## 2020-10-13 LAB — CBC WITH DIFFERENTIAL/PLATELET
Basophils Absolute: 0 10*3/uL (ref 0.0–0.1)
Basophils Relative: 0.5 % (ref 0.0–3.0)
Eosinophils Absolute: 0.1 10*3/uL (ref 0.0–0.7)
Eosinophils Relative: 2.4 % (ref 0.0–5.0)
HCT: 37.3 % (ref 36.0–46.0)
Hemoglobin: 12.9 g/dL (ref 12.0–15.0)
Lymphocytes Relative: 25.2 % (ref 12.0–46.0)
Lymphs Abs: 1.6 10*3/uL (ref 0.7–4.0)
MCHC: 34.5 g/dL (ref 30.0–36.0)
MCV: 94.8 fl (ref 78.0–100.0)
Monocytes Absolute: 0.7 10*3/uL (ref 0.1–1.0)
Monocytes Relative: 11.6 % (ref 3.0–12.0)
Neutro Abs: 3.8 10*3/uL (ref 1.4–7.7)
Neutrophils Relative %: 60.3 % (ref 43.0–77.0)
Platelets: 399 10*3/uL (ref 150.0–400.0)
RBC: 3.93 Mil/uL (ref 3.87–5.11)
RDW: 12.9 % (ref 11.5–15.5)
WBC: 6.3 10*3/uL (ref 4.0–10.5)

## 2020-10-13 LAB — COMPREHENSIVE METABOLIC PANEL
ALT: 12 U/L (ref 0–35)
AST: 22 U/L (ref 0–37)
Albumin: 3.1 g/dL — ABNORMAL LOW (ref 3.5–5.2)
Alkaline Phosphatase: 73 U/L (ref 39–117)
BUN: 11 mg/dL (ref 6–23)
CO2: 31 mEq/L (ref 19–32)
Calcium: 9.1 mg/dL (ref 8.4–10.5)
Chloride: 89 mEq/L — ABNORMAL LOW (ref 96–112)
Creatinine, Ser: 0.72 mg/dL (ref 0.40–1.20)
GFR: 81.37 mL/min (ref >60.00)
Glucose, Bld: 95 mg/dL (ref 70–99)
Potassium: 4.7 mEq/L (ref 3.5–5.1)
Sodium: 126 mEq/L — ABNORMAL LOW (ref 135–145)
Total Bilirubin: 0.6 mg/dL (ref 0.2–1.2)
Total Protein: 5.1 g/dL — ABNORMAL LOW (ref 6.0–8.3)

## 2020-10-13 LAB — LIPID PANEL
Cholesterol: 186 mg/dL (ref 0–200)
HDL: 88.6 mg/dL (ref >39.00)
LDL Cholesterol: 82 mg/dL (ref 0–99)
NonHDL: 97.45
Total CHOL/HDL Ratio: 2
Triglycerides: 79 mg/dL (ref 0.0–149.0)
VLDL: 15.8 mg/dL (ref 0.0–40.0)

## 2020-10-13 LAB — HEMOGLOBIN A1C: Hgb A1c MFr Bld: 5.6 % (ref 4.6–6.5)

## 2020-10-13 LAB — TSH: TSH: 4 u[IU]/mL (ref 0.35–4.50)

## 2020-10-17 ENCOUNTER — Ambulatory Visit (INDEPENDENT_AMBULATORY_CARE_PROVIDER_SITE_OTHER): Payer: Medicare Other | Admitting: Orthopaedic Surgery

## 2020-10-17 ENCOUNTER — Encounter: Payer: Self-pay | Admitting: Orthopaedic Surgery

## 2020-10-17 DIAGNOSIS — S82035D Nondisplaced transverse fracture of left patella, subsequent encounter for closed fracture with routine healing: Secondary | ICD-10-CM

## 2020-10-17 NOTE — Progress Notes (Signed)
The patient is seen today 12 weeks after sustaining a left patella fracture.  This was nondisplaced fracture.  She is an active 74 year old female.  We did not need to x-ray her today.  She has been in a hinged knee brace and using a crutch with weightbearing as tolerated.  Examination of her left knee today shows full range of motion.  There is no knee joint effusion.  She has 5 out of 5 strength with knee flexion and knee extension.  There is no laxity extension at all.  She can perform a straight leg raise easily.  The knee is ligamentously stable.  At this point follow-up can be as needed since she is doing so well.  I showed her quad strengthening exercises which she can do on her own.  She walked easily without the knee brace and without the crutch.  All questions and concerns were answered and addressed.

## 2020-10-19 ENCOUNTER — Other Ambulatory Visit: Payer: Self-pay

## 2020-10-19 ENCOUNTER — Encounter: Payer: Self-pay | Admitting: Family Medicine

## 2020-10-19 ENCOUNTER — Ambulatory Visit (INDEPENDENT_AMBULATORY_CARE_PROVIDER_SITE_OTHER): Payer: Medicare Other | Admitting: Family Medicine

## 2020-10-19 VITALS — BP 126/70 | HR 89 | Temp 96.9°F | Ht 62.25 in | Wt 126.2 lb

## 2020-10-19 DIAGNOSIS — Z0000 Encounter for general adult medical examination without abnormal findings: Secondary | ICD-10-CM

## 2020-10-19 DIAGNOSIS — E2839 Other primary ovarian failure: Secondary | ICD-10-CM

## 2020-10-19 DIAGNOSIS — I2510 Atherosclerotic heart disease of native coronary artery without angina pectoris: Secondary | ICD-10-CM | POA: Diagnosis not present

## 2020-10-19 DIAGNOSIS — R739 Hyperglycemia, unspecified: Secondary | ICD-10-CM

## 2020-10-19 DIAGNOSIS — E785 Hyperlipidemia, unspecified: Secondary | ICD-10-CM | POA: Insufficient documentation

## 2020-10-19 DIAGNOSIS — I700 Atherosclerosis of aorta: Secondary | ICD-10-CM

## 2020-10-19 DIAGNOSIS — M810 Age-related osteoporosis without current pathological fracture: Secondary | ICD-10-CM

## 2020-10-19 DIAGNOSIS — E871 Hypo-osmolality and hyponatremia: Secondary | ICD-10-CM

## 2020-10-19 DIAGNOSIS — I10 Essential (primary) hypertension: Secondary | ICD-10-CM | POA: Diagnosis not present

## 2020-10-19 DIAGNOSIS — E7800 Pure hypercholesterolemia, unspecified: Secondary | ICD-10-CM

## 2020-10-19 MED ORDER — LOSARTAN POTASSIUM 100 MG PO TABS
100.0000 mg | ORAL_TABLET | Freq: Every day | ORAL | 3 refills | Status: DC
Start: 2020-10-19 — End: 2021-03-20

## 2020-10-19 NOTE — Assessment & Plan Note (Signed)
Reviewed health habits including diet and exercise and skin cancer prevention Reviewed appropriate screening tests for age  Also reviewed health mt list, fam hx and immunization status , as well as social and family history   See HPI Labs reviewed  Pt given info to schedule her mammogram and dexa in December Taking Prolia, one fall with patellar fracture , good exercise Discussed shingrix vaccine Advance directive is up to date No cognitive concerns  Hearing and vision screening and tx are utd (hearing test 6/21)

## 2020-10-19 NOTE — Assessment & Plan Note (Signed)
bp in fair control at this time  BP Readings from Last 1 Encounters:  10/19/20 126/70   No changes needed Most recent labs reviewed  Disc lifstyle change with low sodium diet and exercise  Plan to continue losartan 100 mg daily Seeing nephrology and lasix recently added for hyponatremia

## 2020-10-19 NOTE — Assessment & Plan Note (Signed)
No clinical changes  Controlling HTN and cholesterol

## 2020-10-19 NOTE — Assessment & Plan Note (Signed)
Lab Results  Component Value Date   HGBA1C 5.6 10/13/2020   Good diet disc imp of low glycemic diet and wt loss to prevent DM2

## 2020-10-19 NOTE — Assessment & Plan Note (Signed)
2 y dexa ordered for December Taking prolia  Recent fall and patellar fracture  Taking vit D  Good exercise

## 2020-10-19 NOTE — Assessment & Plan Note (Signed)
Disc goals for lipids and reasons to control them Rev last labs with pt Rev low sat fat diet in detail Well controlled with crestor and diet

## 2020-10-19 NOTE — Patient Instructions (Addendum)
Start the lasix as directed from your nephrologist   Schedule labs in 2 weeks   You can schedule prolia after nov 7th   If you are interested in the new shingles vaccine (Shingrix) - call your local pharmacy to check on coverage and availability  If affordable, get on a wait list at your pharmacy to get the vaccine.  Please call to schedule your mammogram and dexa in December at the breast center   Keep taking good care of yourself
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
## 2020-10-19 NOTE — Assessment & Plan Note (Signed)
Has seen nephrologist  Trying lasix 20 mg bid and will plan labs in 2 weeks  She does watch fluid intake  Per pt mild protein in urine

## 2020-10-21 ENCOUNTER — Telehealth: Payer: Self-pay

## 2020-10-21 ENCOUNTER — Other Ambulatory Visit: Payer: Self-pay | Admitting: Family Medicine

## 2020-10-21 NOTE — Telephone Encounter (Signed)
CrCl is normal at 60.02 and Ca is in normal range. Ok for pt to proceed with inj.on 11/02/20

## 2020-10-21 NOTE — Telephone Encounter (Signed)
Pt called requesting copy of her labs be mailed to her, labs mailed

## 2020-10-21 NOTE — Telephone Encounter (Signed)
Name of [place] Name of Pharmacy:[facility] Last [place] or Written Date and Quantity:09/22/20 #30 tabs 0 refills Last Office Visit and Type:CPE on 10/19/20 Next Office Visit and Type: CPE on 10/27/21 Last Controlled Substance Agreement Date:05/27/13 Last UDS:05/27/13

## 2020-11-02 ENCOUNTER — Ambulatory Visit (INDEPENDENT_AMBULATORY_CARE_PROVIDER_SITE_OTHER): Payer: Medicare Other

## 2020-11-02 DIAGNOSIS — M810 Age-related osteoporosis without current pathological fracture: Secondary | ICD-10-CM | POA: Diagnosis not present

## 2020-11-02 MED ORDER — DENOSUMAB 60 MG/ML ~~LOC~~ SOSY
60.0000 mg | PREFILLED_SYRINGE | Freq: Once | SUBCUTANEOUS | Status: AC
  Administered 2020-11-02: 60 mg via SUBCUTANEOUS

## 2020-11-02 NOTE — Progress Notes (Addendum)
Pt given Prolia in left arm. Tolerated well. Given sticker to put on calendar to reminder her of next injection in May 2022.

## 2020-11-03 ENCOUNTER — Other Ambulatory Visit: Payer: Self-pay

## 2020-11-03 ENCOUNTER — Other Ambulatory Visit (INDEPENDENT_AMBULATORY_CARE_PROVIDER_SITE_OTHER): Payer: Medicare Other

## 2020-11-03 DIAGNOSIS — E871 Hypo-osmolality and hyponatremia: Secondary | ICD-10-CM

## 2020-11-03 NOTE — Addendum Note (Signed)
Addended by: Jacob Moores on: 11/03/2020 11:03 AM   Modules accepted: Orders

## 2020-11-04 LAB — BASIC METABOLIC PANEL
BUN/Creatinine Ratio: 14 (ref 12–28)
BUN: 10 mg/dL (ref 8–27)
CO2: 24 mmol/L (ref 20–29)
Calcium: 8.5 mg/dL — ABNORMAL LOW (ref 8.7–10.3)
Chloride: 88 mmol/L — ABNORMAL LOW (ref 96–106)
Creatinine, Ser: 0.73 mg/dL (ref 0.57–1.00)
GFR calc Af Amer: 93 mL/min/{1.73_m2} (ref >59)
GFR calc non Af Amer: 80 mL/min/{1.73_m2} (ref >59)
Glucose: 93 mg/dL (ref 65–99)
Potassium: 4.8 mmol/L (ref 3.5–5.2)
Sodium: 123 mmol/L — ABNORMAL LOW (ref 134–144)

## 2020-11-08 ENCOUNTER — Other Ambulatory Visit: Payer: Self-pay | Admitting: Family Medicine

## 2020-11-08 ENCOUNTER — Telehealth: Payer: Self-pay | Admitting: *Deleted

## 2020-11-08 ENCOUNTER — Encounter: Payer: Self-pay | Admitting: Family Medicine

## 2020-11-08 DIAGNOSIS — Z1231 Encounter for screening mammogram for malignant neoplasm of breast: Secondary | ICD-10-CM

## 2020-11-08 NOTE — Telephone Encounter (Signed)
Left VM requesting pt to call the office back,  Also sent copy of labs to [facility]

## 2020-11-08 NOTE — Telephone Encounter (Signed)
Pt returned my call, she said she is frustrated because she feels that her doctor's should be "coordinating" this not her having to call them herself to make sure they got her labs and see what the plan is. Pt said she thought Dr. Glori Bickers would check in with her nephrologist so they are on the same page and not make her do it. Pt said she will call nephrologist tomorrow but she just doesn't know what to do anymore, she wants to know how to bring this sodium level up, she asked if she needs IV fluids to help or what she needs to do. I advise pt I would relay her concerns to PCP and we will follow up with her. Pt understands that nephrologist isn't in [facility] but she feels like since there is an issue that her doctors should at least communicate to make a plan

## 2020-11-08 NOTE — Telephone Encounter (Signed)
-----   Message from Abner Greenspan, MD sent at 11/05/2020 11:32 AM EST ----- Sodium is lower unfortunately at 123  Copy to her nephrologist (also copy of this phone note)  Ask pt to call her nephrologist for further instructions

## 2020-11-08 NOTE — Telephone Encounter (Signed)
I sent her to nephrology to work on the kidney issue and her doctor asked [place] to do the labs , so I do not know what her nephrologist's plan will be. All I can do is send the labs she wanted and send her a flag (which I did) alerting her to the fact that they are done. Sorry, that is about all I can do.

## 2020-12-20 ENCOUNTER — Other Ambulatory Visit: Payer: Self-pay

## 2020-12-20 ENCOUNTER — Ambulatory Visit
Admission: RE | Admit: 2020-12-20 | Discharge: 2020-12-20 | Disposition: A | Discharge status: Home / Self Care | Payer: Medicare Other | Source: Ambulatory Visit | Attending: Family Medicine | Admitting: Family Medicine

## 2020-12-20 DIAGNOSIS — Z1231 Encounter for screening mammogram for malignant neoplasm of breast: Secondary | ICD-10-CM

## 2020-12-20 IMAGING — MG DIGITAL SCREENING BILAT W/ TOMO W/ CAD
8 series · 9 of 24 positions shown · non-contrast
Comparison: Previous exam(s).

CLINICAL DATA: Screening.

EXAM:
DIGITAL SCREENING BILATERAL MAMMOGRAM WITH TOMO AND CAD

[R CC synth-2D]
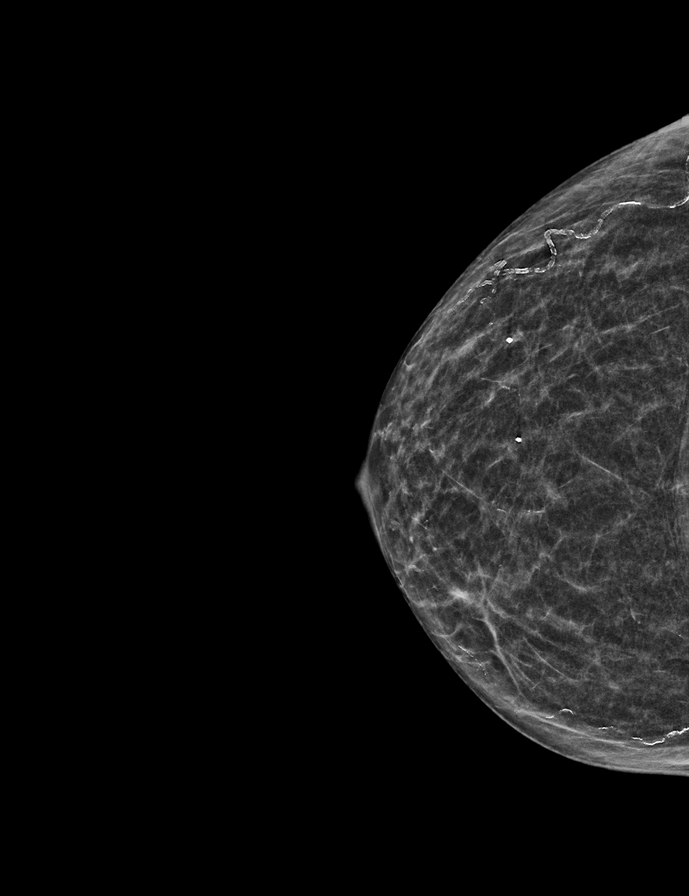

[R MLO synth-2D]
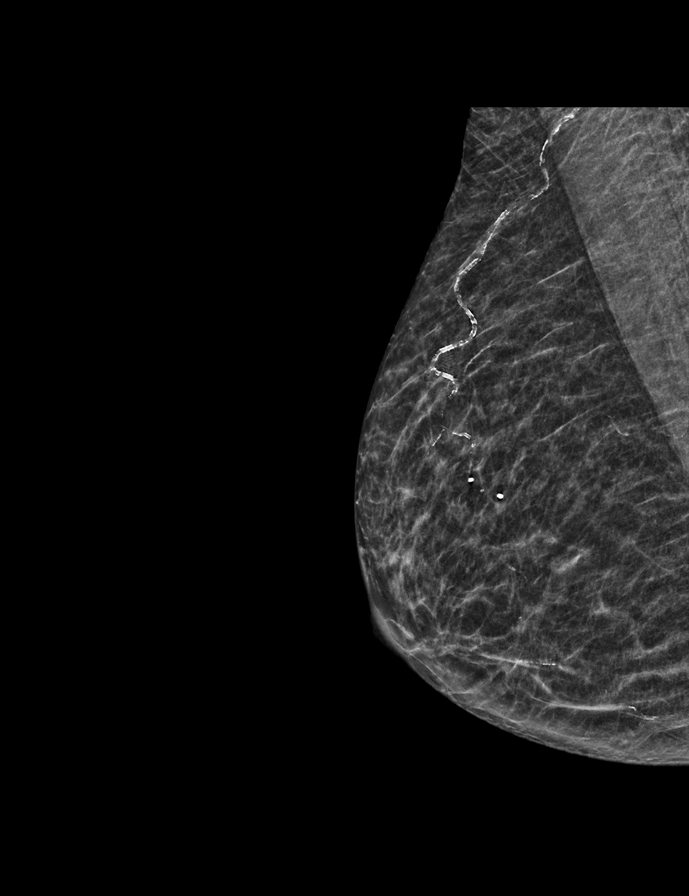

[L MLO synth-2D]
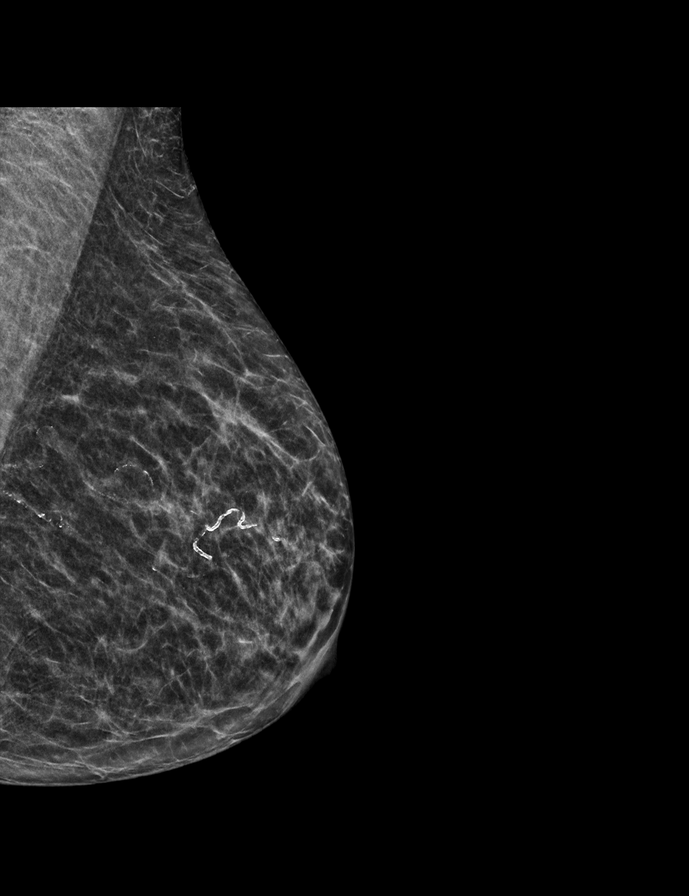

[L CC synth-2D]
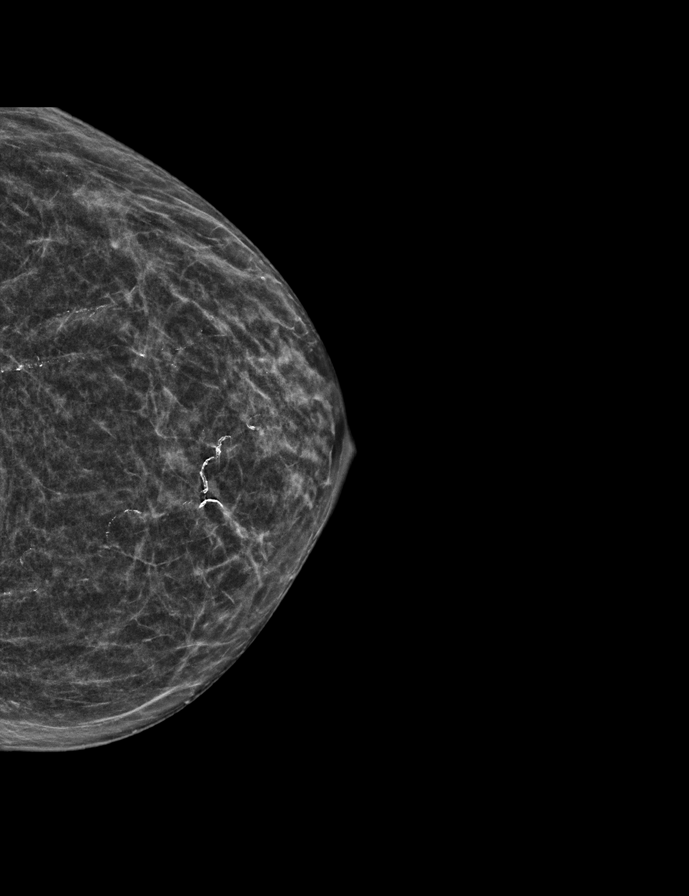

[L CC tomo · 2 of 46 frames shown]
[frame 15/46]
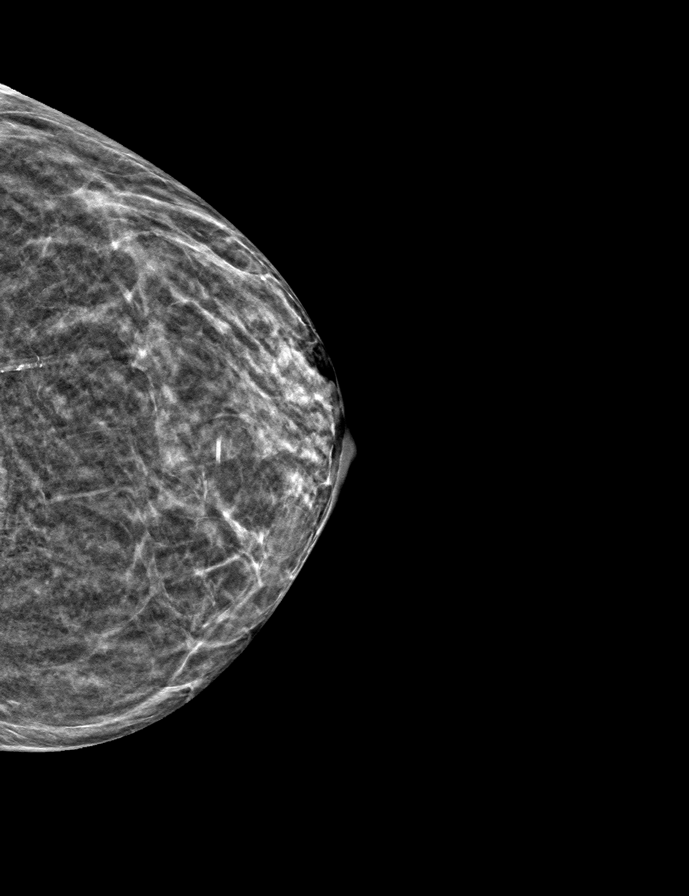
[frame 23/46]
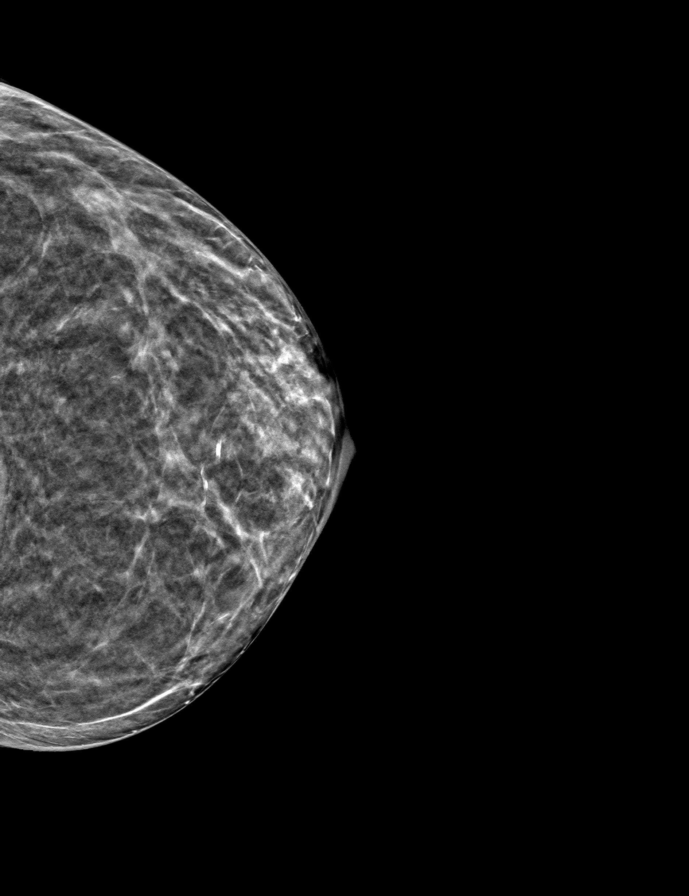

[R MLO tomo · tomo slice 23/45.0]
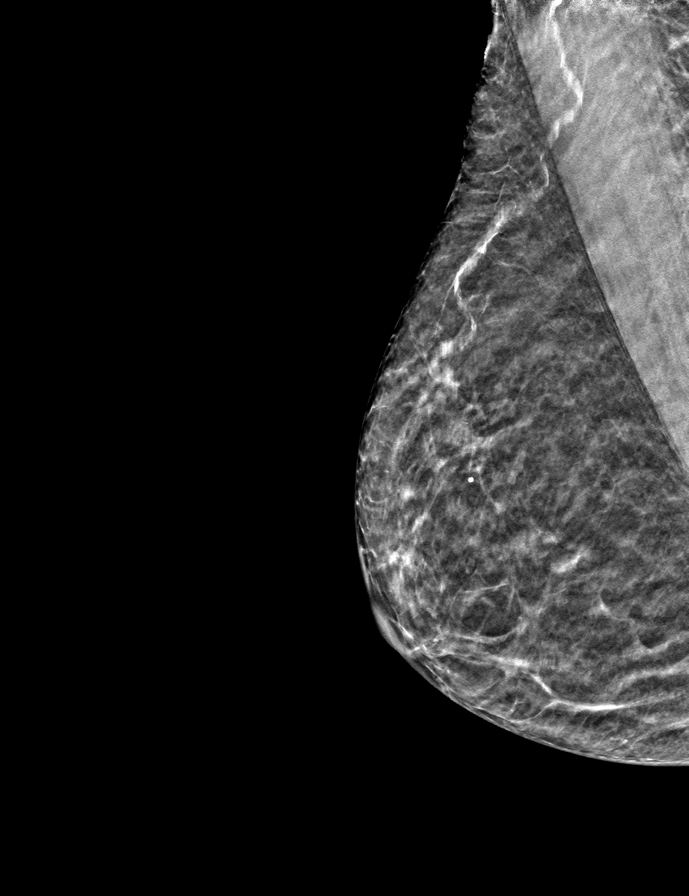

[R CC tomo · tomo slice 23/46.0]
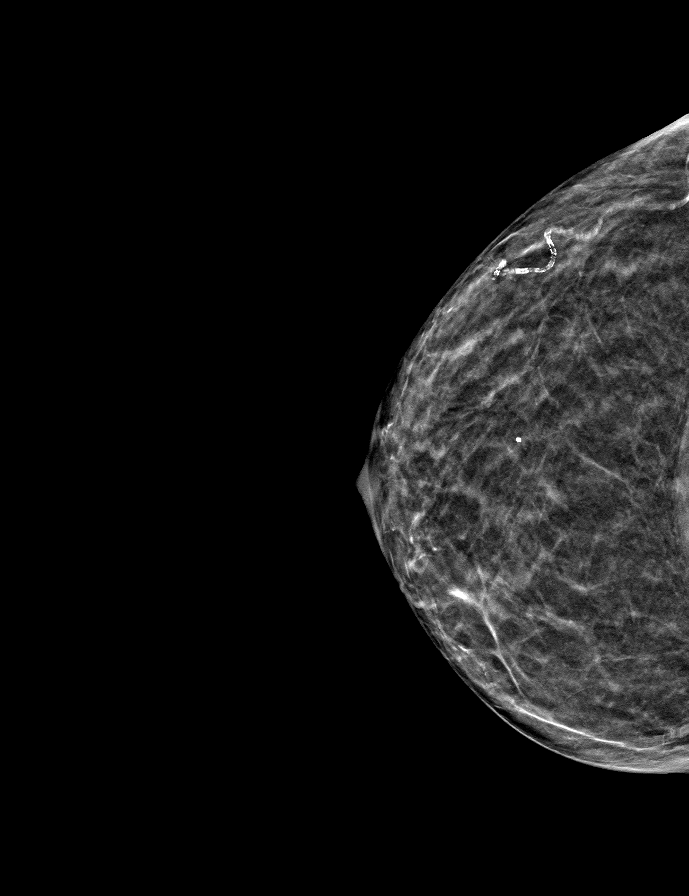

[L MLO tomo · tomo slice 25/50.0]
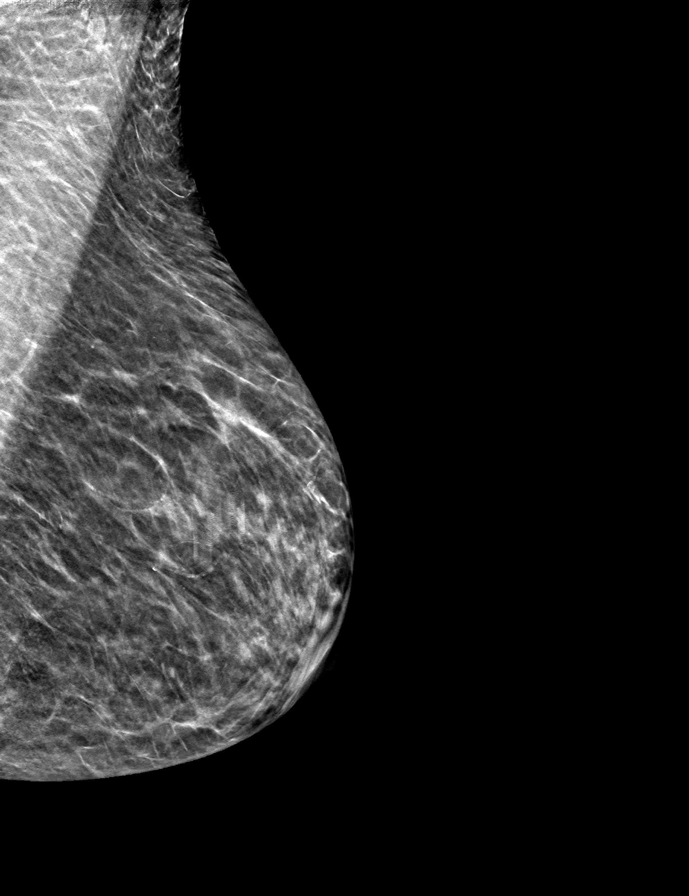

[9 of 24 positions shown; findings below may reference images not displayed]

ACR Breast Density Category b: There are scattered areas of
fibroglandular density.
FINDINGS: There are no findings suspicious for malignancy. Images were
processed with CAD.
IMPRESSION: No mammographic evidence of malignancy. A result letter of this
screening mammogram will be mailed directly to the patient.

RECOMMENDATION:
Screening mammogram in one year. (Code:[TQ])

BI-RADS CATEGORY  1: Negative.

## 2020-12-29 ENCOUNTER — Encounter: Payer: Self-pay | Admitting: Family Medicine

## 2020-12-30 ENCOUNTER — Telehealth: Payer: Self-pay | Admitting: Hematology and Oncology

## 2020-12-30 NOTE — Telephone Encounter (Signed)
Received a new hem referral from Dr. Johnney Ou at [facility] for +m spike. Pt has been cld and scheduled to see Dr. Lorenso Courier on 1/12 at 2pm. Pt aware to arrive 20 minutes early.
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
## 2021-01-04 ENCOUNTER — Inpatient Hospital Stay (HOSPITAL_BASED_OUTPATIENT_CLINIC_OR_DEPARTMENT_OTHER): Payer: Medicare Other | Admitting: Hematology and Oncology

## 2021-01-04 ENCOUNTER — Inpatient Hospital Stay: Payer: Medicare Other | Attending: Hematology and Oncology

## 2021-01-04 ENCOUNTER — Encounter: Payer: Self-pay | Admitting: Hematology and Oncology

## 2021-01-04 ENCOUNTER — Other Ambulatory Visit: Payer: Self-pay

## 2021-01-04 VITALS — BP 172/82 | HR 108 | Temp 97.5°F | Resp 20 | Ht 62.25 in | Wt 127.0 lb

## 2021-01-04 DIAGNOSIS — D809 Immunodeficiency with predominantly antibody defects, unspecified: Secondary | ICD-10-CM | POA: Insufficient documentation

## 2021-01-04 DIAGNOSIS — I10 Essential (primary) hypertension: Secondary | ICD-10-CM

## 2021-01-04 DIAGNOSIS — Z801 Family history of malignant neoplasm of trachea, bronchus and lung: Secondary | ICD-10-CM

## 2021-01-04 DIAGNOSIS — E871 Hypo-osmolality and hyponatremia: Secondary | ICD-10-CM | POA: Insufficient documentation

## 2021-01-04 DIAGNOSIS — M810 Age-related osteoporosis without current pathological fracture: Secondary | ICD-10-CM

## 2021-01-04 DIAGNOSIS — D472 Monoclonal gammopathy: Secondary | ICD-10-CM | POA: Diagnosis present

## 2021-01-04 DIAGNOSIS — Z90710 Acquired absence of both cervix and uterus: Secondary | ICD-10-CM | POA: Insufficient documentation

## 2021-01-04 DIAGNOSIS — K219 Gastro-esophageal reflux disease without esophagitis: Secondary | ICD-10-CM | POA: Diagnosis not present

## 2021-01-04 DIAGNOSIS — R809 Proteinuria, unspecified: Secondary | ICD-10-CM

## 2021-01-04 DIAGNOSIS — R803 Bence Jones proteinuria: Secondary | ICD-10-CM

## 2021-01-04 LAB — CBC WITH DIFFERENTIAL (CANCER CENTER ONLY)
Abs Immature Granulocytes: 0.02 10*3/uL (ref 0.00–0.07)
Basophils Absolute: 0 10*3/uL (ref 0.0–0.1)
Basophils Relative: 0 %
Eosinophils Absolute: 0 10*3/uL (ref 0.0–0.5)
Eosinophils Relative: 0 %
HCT: 37.2 % (ref 36.0–46.0)
Hemoglobin: 13 g/dL (ref 12.0–15.0)
Immature Granulocytes: 0 %
Lymphocytes Relative: 16 %
Lymphs Abs: 1.5 10*3/uL (ref 0.7–4.0)
MCH: 32 pg (ref 26.0–34.0)
MCHC: 34.9 g/dL (ref 30.0–36.0)
MCV: 91.6 fL (ref 80.0–100.0)
Monocytes Absolute: 0.7 10*3/uL (ref 0.1–1.0)
Monocytes Relative: 8 %
Neutro Abs: 7 10*3/uL (ref 1.7–7.7)
Neutrophils Relative %: 76 %
Platelet Count: 397 10*3/uL (ref 150–400)
RBC: 4.06 MIL/uL (ref 3.87–5.11)
RDW: 13.4 % (ref 11.5–15.5)
WBC Count: 9.2 10*3/uL (ref 4.0–10.5)
nRBC: 0 % (ref 0.0–0.2)

## 2021-01-04 LAB — CMP (CANCER CENTER ONLY)
ALT: 14 U/L (ref 0–44)
AST: 25 U/L (ref 15–41)
Albumin: 2.9 g/dL — ABNORMAL LOW (ref 3.5–5.0)
Alkaline Phosphatase: 72 U/L (ref 38–126)
Anion gap: 9 (ref 5–15)
BUN: 13 mg/dL (ref 8–23)
CO2: 27 mmol/L (ref 22–32)
Calcium: 9.6 mg/dL (ref 8.9–10.3)
Chloride: 95 mmol/L — ABNORMAL LOW (ref 98–111)
Creatinine: 0.82 mg/dL (ref 0.44–1.00)
GFR, Estimated: >60 mL/min (ref >60)
Glucose, Bld: 109 mg/dL — ABNORMAL HIGH (ref 70–99)
Potassium: 4.8 mmol/L (ref 3.5–5.1)
Sodium: 131 mmol/L — ABNORMAL LOW (ref 135–145)
Total Bilirubin: 0.6 mg/dL (ref 0.3–1.2)
Total Protein: 6.2 g/dL — ABNORMAL LOW (ref 6.5–8.1)

## 2021-01-04 LAB — LACTATE DEHYDROGENASE: LDH: 254 U/L — ABNORMAL HIGH (ref 98–192)

## 2021-01-05 ENCOUNTER — Encounter (HOSPITAL_COMMUNITY): Payer: Self-pay

## 2021-01-05 LAB — KAPPA/LAMBDA LIGHT CHAINS
Kappa free light chain: 16.5 mg/L (ref 3.3–19.4)
Kappa, lambda light chain ratio: 0.04 — ABNORMAL LOW (ref 0.26–1.65)
Lambda free light chains: 388.7 mg/L — ABNORMAL HIGH (ref 5.7–26.3)

## 2021-01-05 LAB — BETA 2 MICROGLOBULIN, SERUM: Beta-2 Microglobulin: 2.5 mg/L — ABNORMAL HIGH (ref 0.6–2.4)

## 2021-01-05 NOTE — Progress Notes (Signed)
Message Received: Today  Message Details  Corrie Mckusick, DO  Ashish Rossetti E; P Ir Procedure Requests Both are performed with moderate sedation.  Bone marrow biopsy is performed typically with CT (sometimes fluoro) and fat pad bx with [place].   It would be reasonable to perform both during single anesthesia session, using both [place] and CT/fluoro in the same room.   Because 2 modalities will be required, 2 separate orders will be needed.   Earleen Newport    Previous Messages  ----- Message -----  From: Lenore Cordia  Sent: 01/05/2021 10:50 AM EST  To: Ir Procedure Requests   Hello   Bone Marrow Biopsy to include a Fat Pad Biopsy   Does the Fat Pad Biopsy need review?   Can I schedule them both at the same time?   Do I double the time?   Lillah Standre

## 2021-01-06 ENCOUNTER — Telehealth: Payer: Self-pay

## 2021-01-06 MED ORDER — ALPRAZOLAM 0.5 MG PO TABS: 0.5000 mg | ORAL_TABLET | Freq: Two times a day (BID) | ORAL | 0 refills | Status: DC | PRN

## 2021-01-06 NOTE — Telephone Encounter (Signed)
Pt wanted to be seen in office, has never done a virtual visit so appt to discuss sxs was scheduled on Wednesday. Pt said she has never been on anxiety meds so whatever Dr. Glori Bickers thinks will help is fine. She said she just would like something "mild" to help. Pt said her man sxs is worrying about her health it makes her anxious.   Pt said if PCP sends something in till appt she will use [facility] [address]

## 2021-01-06 NOTE — Telephone Encounter (Signed)
Pt notified Rx sent to pharmacy and advise of Dr. Marliss Coots instructions and verbalized understanding

## 2021-01-06 NOTE — Telephone Encounter (Signed)
Virtual is fine.  Please ask her how she is feeling/what her anxiety symptoms are and if she has ever taken medicine for anxiety. It will be most likely after the weekend so perhaps I can send something temporary for the weekend to have on hand.

## 2021-01-06 NOTE — Telephone Encounter (Signed)
I sent low dose xanax  Use caution of sedation and also over time this can be habit forming so we will not use it for long (just to get through to appointment)  If any problems let me know Use only as needed   Hope it helps

## 2021-01-06 NOTE — Telephone Encounter (Signed)
Pt called in to report that she has noticed that she has noticed an increase in anxiety following seeing Dr Lorenso Courier Oncology...   Pt is looking for advice or to know if something can be prescribed... is virtual appropriate to discuss?

## 2021-01-09 ENCOUNTER — Ambulatory Visit (HOSPITAL_COMMUNITY)
Admission: RE | Admit: 2021-01-09 | Discharge: 2021-01-09 | Disposition: A | Discharge status: Home / Self Care | Payer: Medicare Other | Source: Ambulatory Visit | Attending: Hematology and Oncology | Admitting: Hematology and Oncology

## 2021-01-09 ENCOUNTER — Other Ambulatory Visit: Payer: Self-pay

## 2021-01-09 DIAGNOSIS — D472 Monoclonal gammopathy: Secondary | ICD-10-CM

## 2021-01-09 IMAGING — DX DG BONE SURVEY MET
9 of 10 series · 9 of 10 positions shown · non-contrast
Comparison: Prior CT of the chest on [DATE] and prior
additional x-rays.

CLINICAL DATA: Monoclonal gammopathy.

EXAM:
METASTATIC BONE SURVEY

[skull lat]
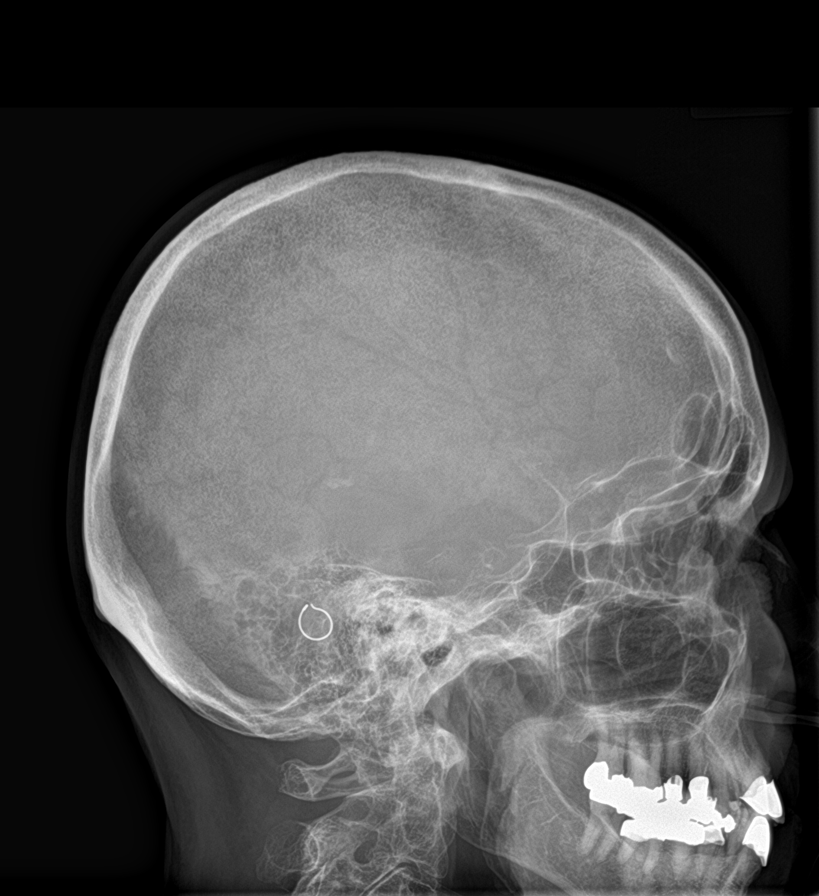

[shoulder ap (1 of 2)]
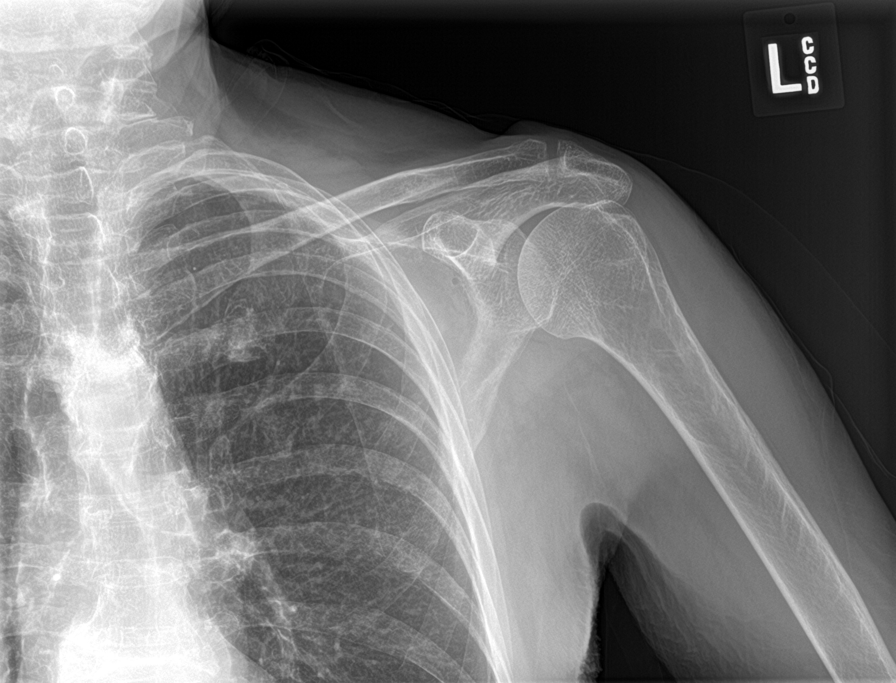

[shoulder ap (2 of 2)]
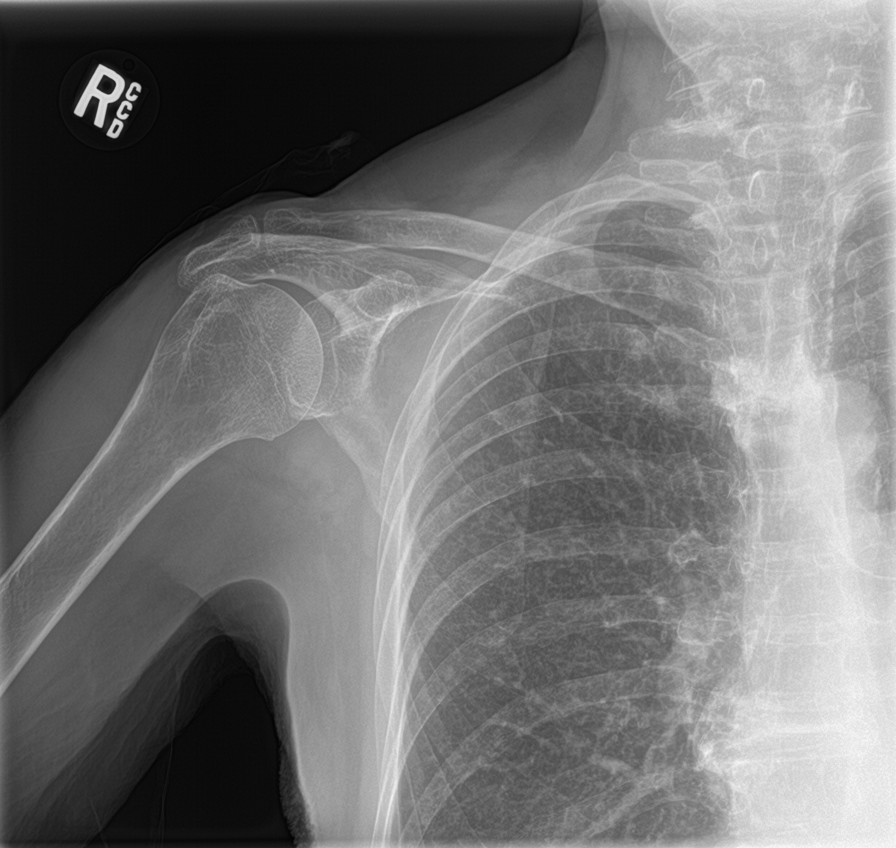

[humerus ap (1 of 2)]
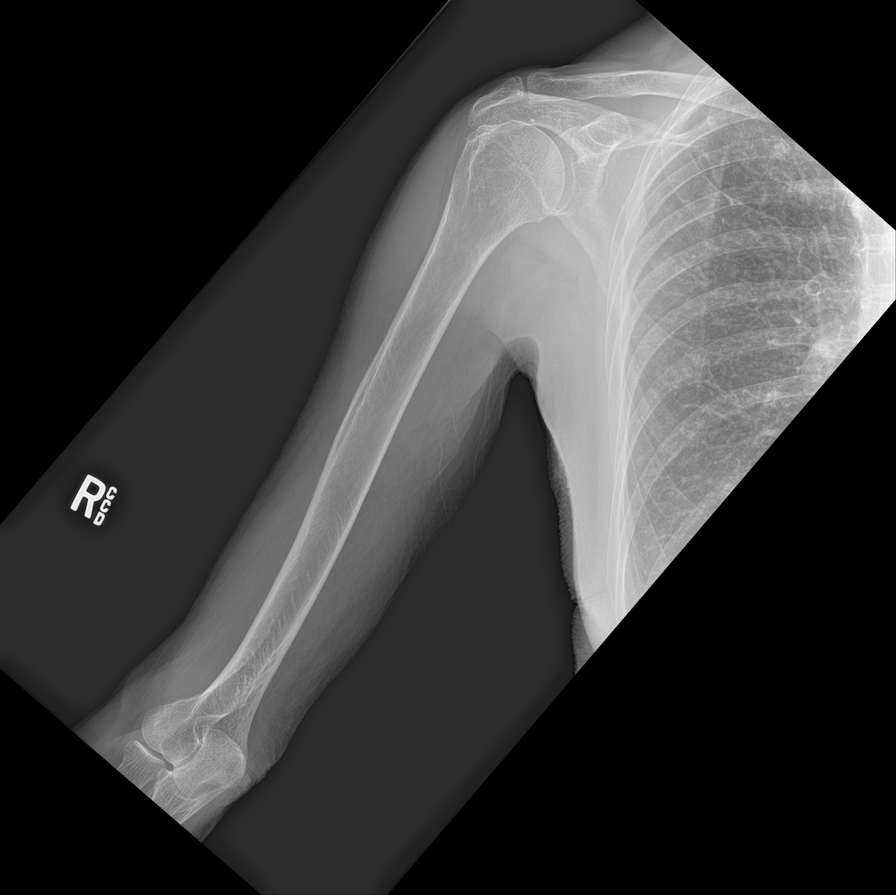

[humerus ap (2 of 2)]
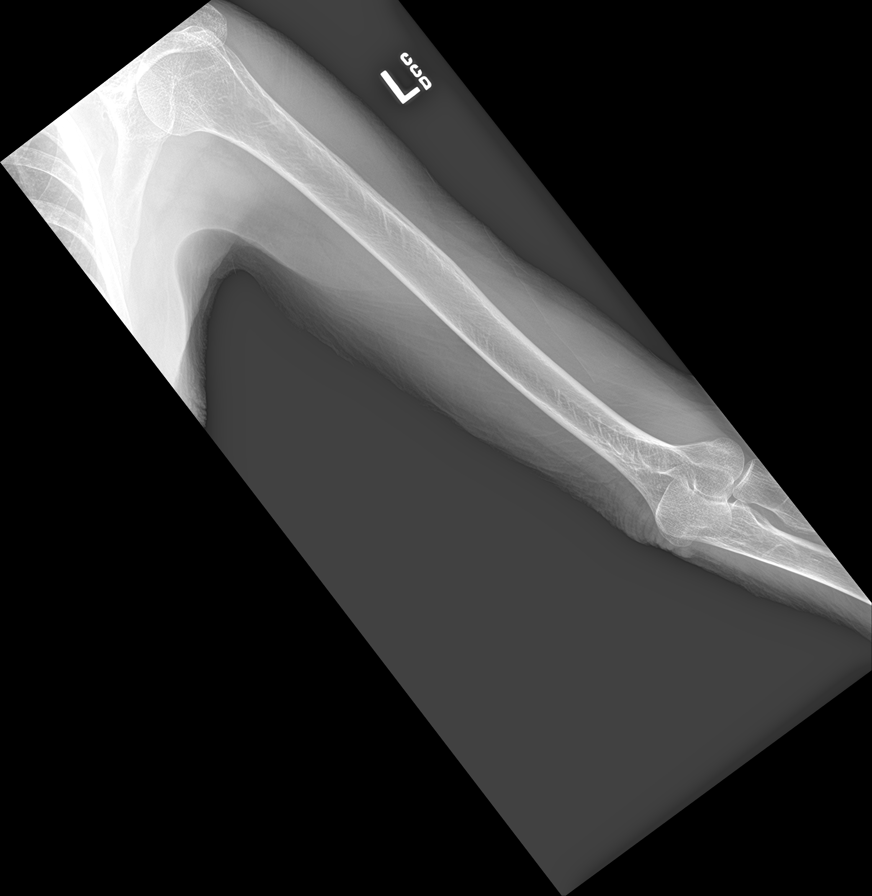

[forearm ap (1 of 2)]
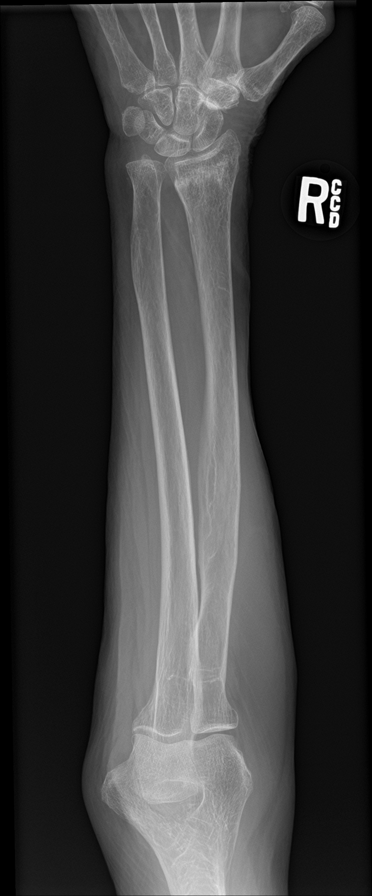

[forearm ap (2 of 2)]
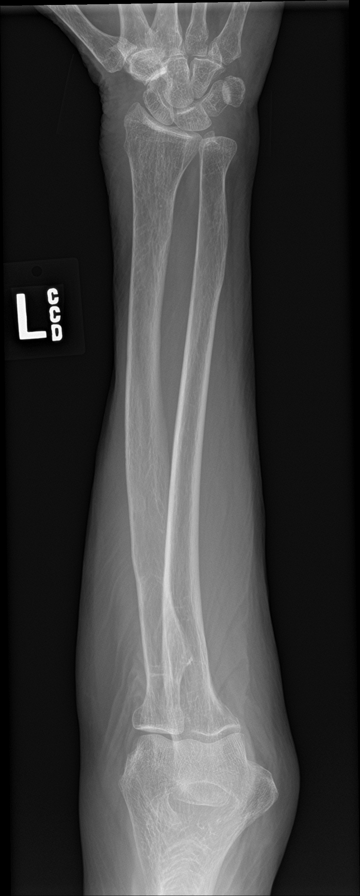

[c-spine ap]
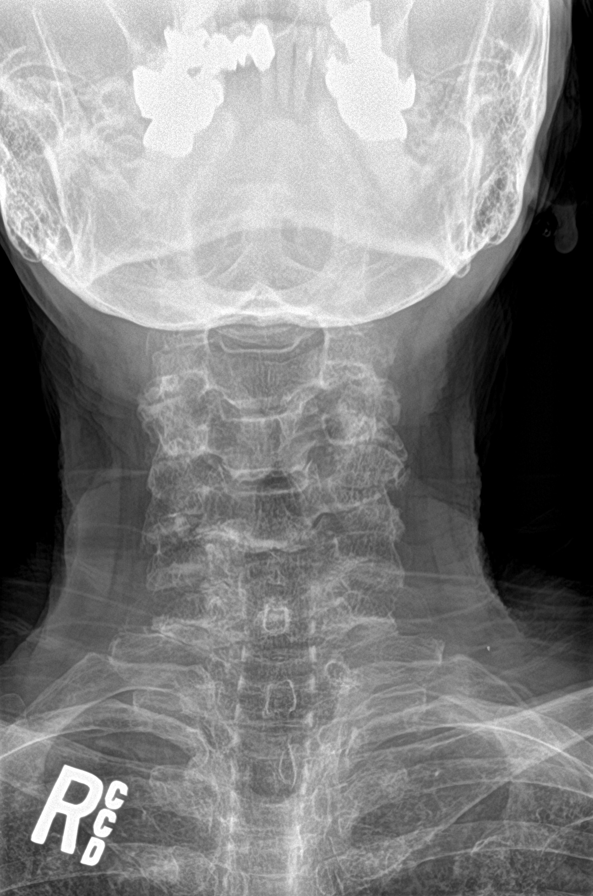

[c-spine lat]
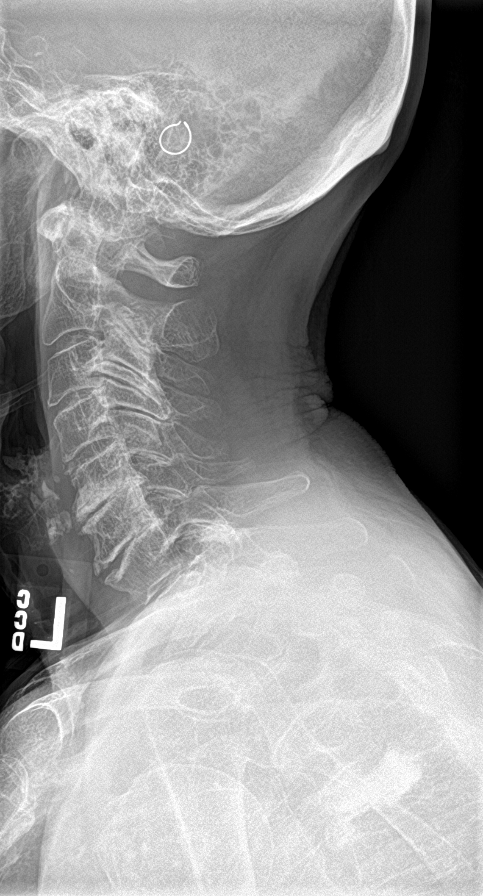

[9 of 10 positions shown; findings below may reference images not displayed]

FINDINGS: Imaging of the skull, axial skeleton, proximal appendicular skeleton
and bony pelvis demonstrates no evidence of lytic or sclerotic bone
lesions. The patient is status post prior vertebral augmentation at
the T5, T12, L1, L2 and L3 levels. There is chronic severe
compression of the T10 vertebral body. No fractures identified. No
significant arthropathy identified.
IMPRESSION: 1. No focal bony lesions identified by x-ray.
2. Prior vertebral augmentation at T5, T12, L1, L2 and L3. Chronic
compression of the T10 vertebral body.

## 2021-01-11 ENCOUNTER — Encounter: Payer: Self-pay | Admitting: Family Medicine

## 2021-01-11 ENCOUNTER — Other Ambulatory Visit: Payer: Self-pay

## 2021-01-11 ENCOUNTER — Telehealth (INDEPENDENT_AMBULATORY_CARE_PROVIDER_SITE_OTHER): Payer: Medicare Other | Admitting: Family Medicine

## 2021-01-11 DIAGNOSIS — F418 Other specified anxiety disorders: Secondary | ICD-10-CM

## 2021-01-11 DIAGNOSIS — D472 Monoclonal gammopathy: Secondary | ICD-10-CM

## 2021-01-11 LAB — MULTIPLE MYELOMA PANEL, SERUM
Albumin SerPl Elph-Mcnc: 3.1 g/dL (ref 2.9–4.4)
Albumin/Glob SerPl: 1.3 (ref 0.7–1.7)
Alpha 1: 0.3 g/dL (ref 0.0–0.4)
Alpha2 Glob SerPl Elph-Mcnc: 0.9 g/dL (ref 0.4–1.0)
B-Globulin SerPl Elph-Mcnc: 0.8 g/dL (ref 0.7–1.3)
Gamma Glob SerPl Elph-Mcnc: 0.5 g/dL (ref 0.4–1.8)
Globulin, Total: 2.5 g/dL (ref 2.2–3.9)
IgA: 178 mg/dL (ref 64–422)
IgG (Immunoglobin G), Serum: 473 mg/dL — ABNORMAL LOW (ref 586–1602)
IgM (Immunoglobulin M), Srm: 76 mg/dL (ref 26–217)
Total Protein ELP: 5.6 g/dL — ABNORMAL LOW (ref 6.0–8.5)

## 2021-01-11 NOTE — Assessment & Plan Note (Signed)
Since diagnosis of monoclonal gammopathy  Fear of unknown- pending further diagnostics Xanax low dose has helped (she takes 1/2 of 0.5 mg daily as needed) Will continue this prn and monitor  Would consider something else if anxiety persisted past the stressor  Good insight and self care Good support Reviewed stressors/ coping techniques/symptoms/ support sources/ tx options and side effects in detail today  Encouraged good self care/physical activity and things she enjoys (like reading)  If addnl medicine is needed would avoid ssri due to history of hyponatremia

## 2021-01-11 NOTE — Patient Instructions (Signed)
Take xanax as needed (only when you have to)  This can be sedating and habit forming so we use caution with it  Get through your diagnostic testing and the we will see if you need something different for anxiety   Practice good self care and keep talking to your daughter If you would like a counseling appointment please let me know  Get physical exercise if you can  Avoid caffeine  Read and do other things you enjoy

## 2021-01-11 NOTE — Assessment & Plan Note (Signed)
Pending further testing to r/o MM diag after notation of proteinuria from nephrology No bone pain or other symptoms Under care of oncology
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
## 2021-01-12 ENCOUNTER — Telehealth: Payer: Self-pay | Admitting: Hematology and Oncology

## 2021-01-12 NOTE — Telephone Encounter (Signed)
Scheduled per 1/12 los. Calledand spoke with pt and confirmed 2/2 appts

## 2021-01-18 LAB — UPEP/UIFE/LIGHT CHAINS/TP, 24-HR UR
% BETA, Urine: 8.9 %
ALPHA 1 URINE: 7.8 %
Albumin, U: 75.4 %
Alpha 2, Urine: 5.2 %
Free Kappa Lt Chains,Ur: 10.48 mg/L (ref 0.63–113.79)
Free Kappa/Lambda Ratio: 0.09 — ABNORMAL LOW (ref 1.03–31.76)
Free Lambda Lt Chains,Ur: 120.79 mg/L — ABNORMAL HIGH (ref 0.47–11.77)
GAMMA GLOBULIN URINE: 2.7 %
M-SPIKE %, Urine: 1.4 % — ABNORMAL HIGH
M-Spike, Mg/24 Hr: 51 mg/24 hr — ABNORMAL HIGH
Total Protein, Urine-Ur/day: 3615 mg/24 hr — ABNORMAL HIGH (ref 30–150)
Total Protein, Urine: 233.2 mg/dL
Total Volume: 1550

## 2021-01-23 ENCOUNTER — Other Ambulatory Visit: Payer: Self-pay | Admitting: Radiology

## 2021-01-24 ENCOUNTER — Ambulatory Visit (HOSPITAL_COMMUNITY)
Admission: RE | Admit: 2021-01-24 | Discharge: 2021-01-24 | Disposition: A | Discharge status: Home / Self Care | Payer: Medicare Other | Source: Ambulatory Visit | Attending: Hematology and Oncology | Admitting: Hematology and Oncology

## 2021-01-24 ENCOUNTER — Encounter (HOSPITAL_COMMUNITY): Payer: Self-pay

## 2021-01-24 ENCOUNTER — Other Ambulatory Visit: Payer: Self-pay

## 2021-01-24 DIAGNOSIS — D75839 Thrombocytosis, unspecified: Secondary | ICD-10-CM | POA: Insufficient documentation

## 2021-01-24 DIAGNOSIS — C9030 Solitary plasmacytoma not having achieved remission: Secondary | ICD-10-CM | POA: Diagnosis not present

## 2021-01-24 DIAGNOSIS — D472 Monoclonal gammopathy: Secondary | ICD-10-CM

## 2021-01-24 LAB — CBC WITH DIFFERENTIAL/PLATELET
Abs Immature Granulocytes: 0.02 10*3/uL (ref 0.00–0.07)
Basophils Absolute: 0 10*3/uL (ref 0.0–0.1)
Basophils Relative: 1 %
Eosinophils Absolute: 0 10*3/uL (ref 0.0–0.5)
Eosinophils Relative: 0 %
HCT: 37.6 % (ref 36.0–46.0)
Hemoglobin: 13.3 g/dL (ref 12.0–15.0)
Immature Granulocytes: 0 %
Lymphocytes Relative: 18 %
Lymphs Abs: 1.3 10*3/uL (ref 0.7–4.0)
MCH: 32.7 pg (ref 26.0–34.0)
MCHC: 35.4 g/dL (ref 30.0–36.0)
MCV: 92.4 fL (ref 80.0–100.0)
Monocytes Absolute: 0.7 10*3/uL (ref 0.1–1.0)
Monocytes Relative: 10 %
Neutro Abs: 5 10*3/uL (ref 1.7–7.7)
Neutrophils Relative %: 71 %
Platelets: 412 10*3/uL — ABNORMAL HIGH (ref 150–400)
RBC: 4.07 MIL/uL (ref 3.87–5.11)
RDW: 14 % (ref 11.5–15.5)
WBC: 7.1 10*3/uL (ref 4.0–10.5)
nRBC: 0 % (ref 0.0–0.2)

## 2021-01-24 LAB — PROTIME-INR
INR: 1 (ref 0.8–1.2)
Prothrombin Time: 12.5 seconds (ref 11.4–15.2)

## 2021-01-24 IMAGING — CT US BIOPSY
1 of 2 series · 15 of 31 positions shown, 19 images · non-contrast
Comparison: none

CLINICAL DATA: Proteinuria and need for fat pad biopsy for
amyloidosis workup.

[Series 2: i-spiral 5.0 br40 · axial · 0.98mm/px · z∈[-636,-521]mm · 15 of 37 slices shown, 19 images]
[im 2/37  mediastinal]
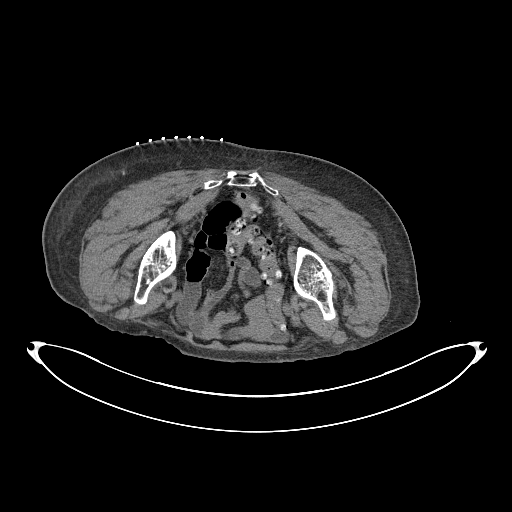
[im 2/37  lung]
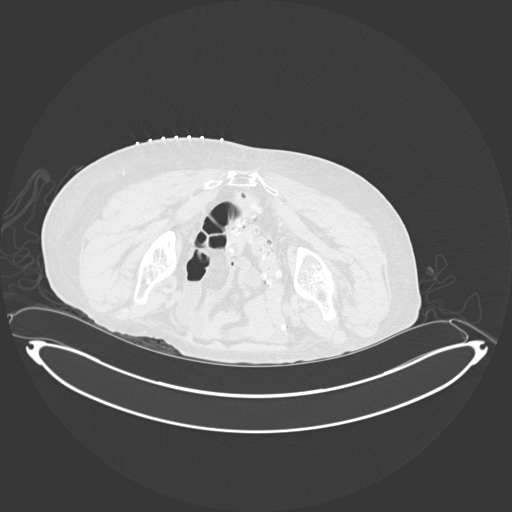
[im 5/37  lung]
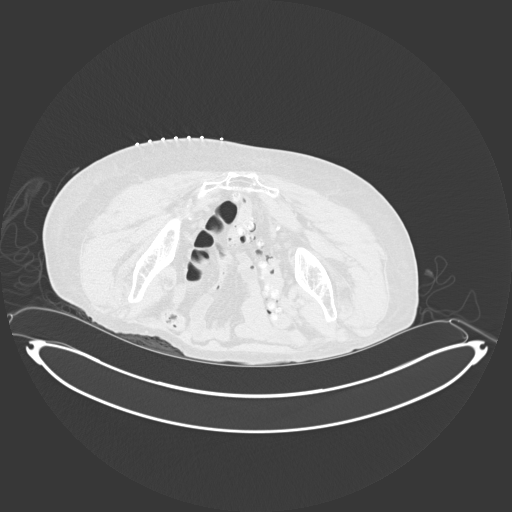
[im 8/37  lung]
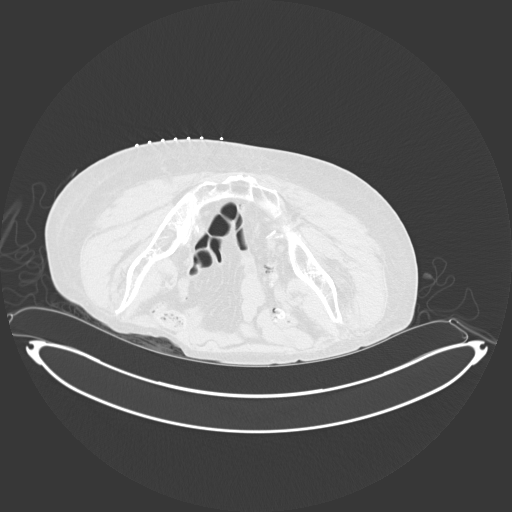
[im 10/37  lung]
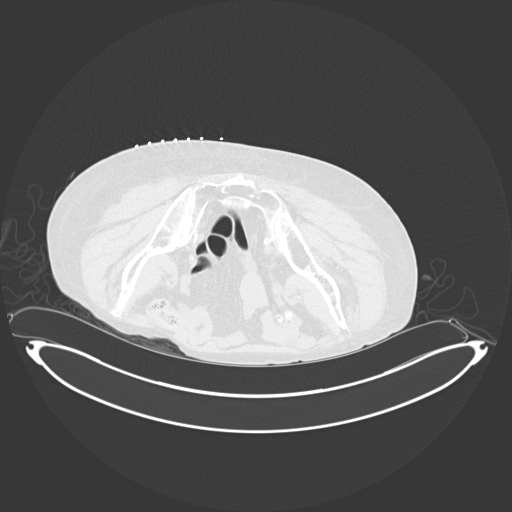
[im 13/37  mediastinal]
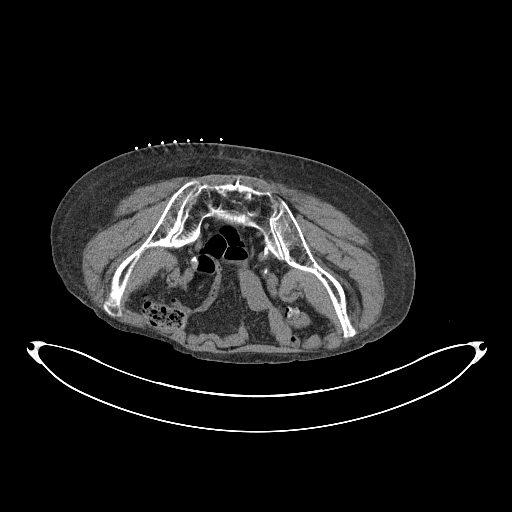
[im 13/37  lung]
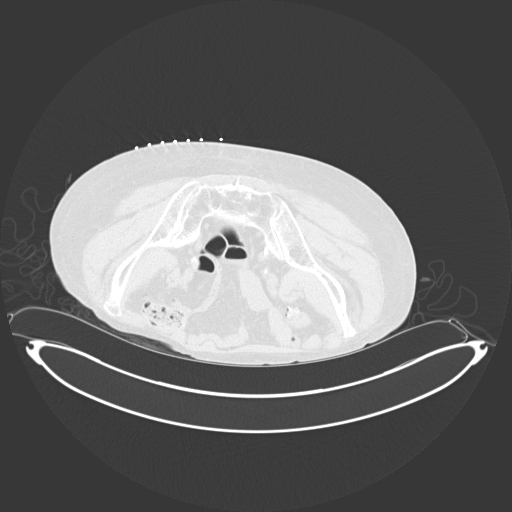
[im 15/37  lung]
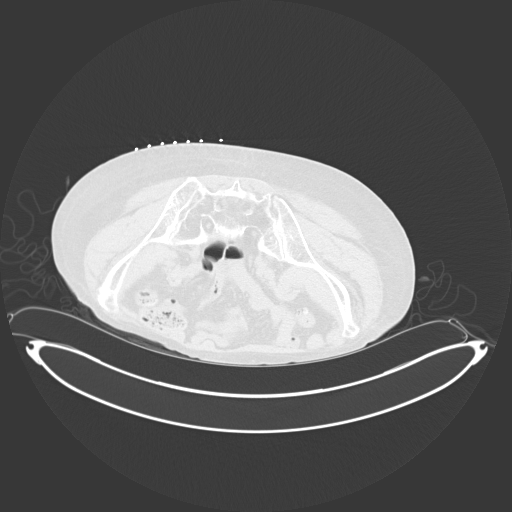
[im 17/37  lung]
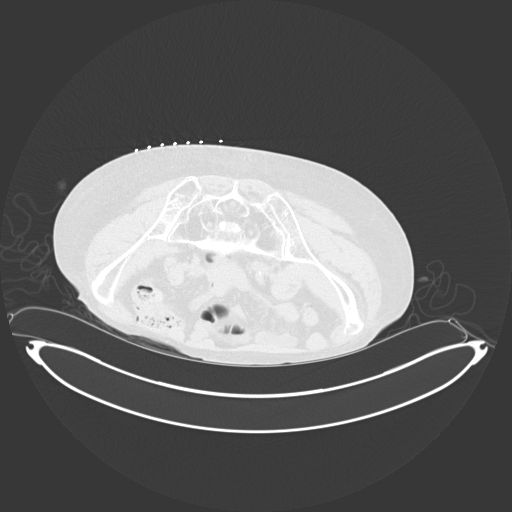
[im 19/37  lung]
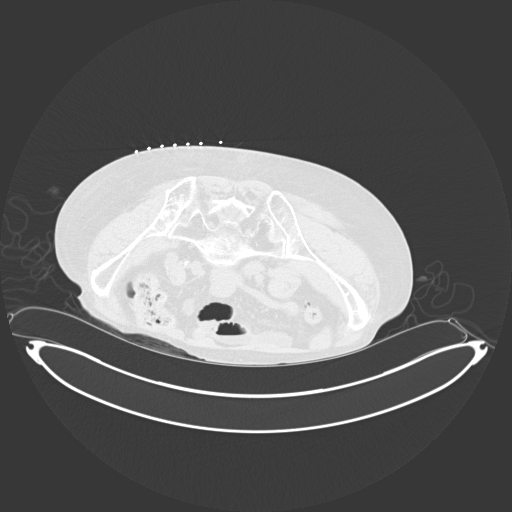
[im 21/37  mediastinal]
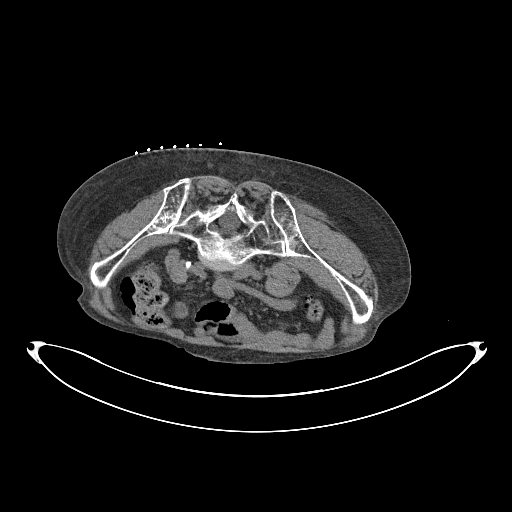
[im 21/37  lung]
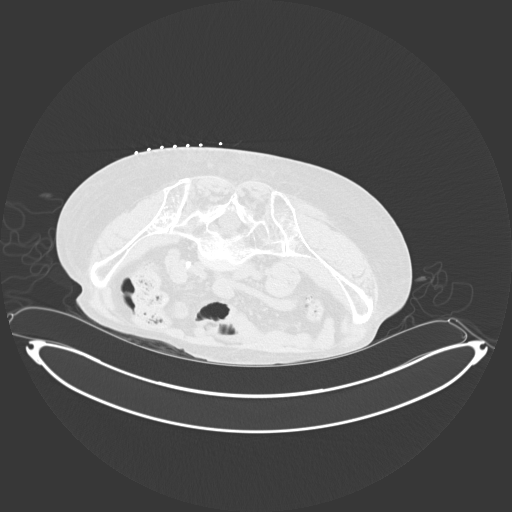
[im 24/37  lung]
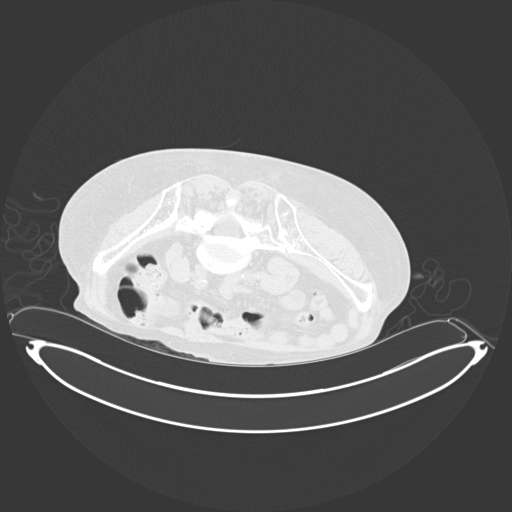
[im 26/37  lung]
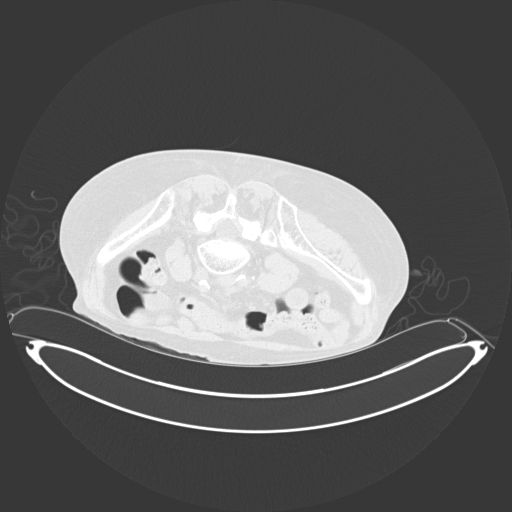
[im 28/37  lung]
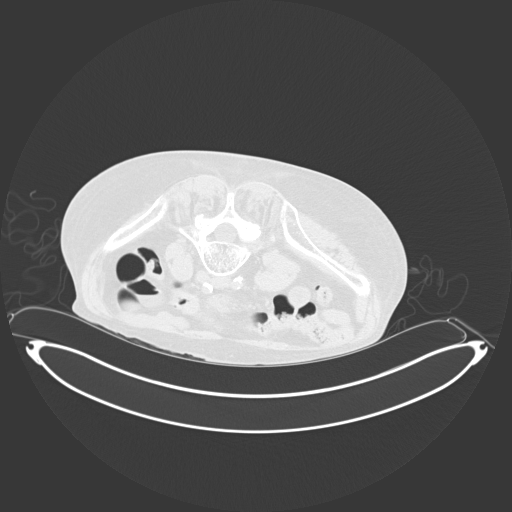
[im 30/37  mediastinal]
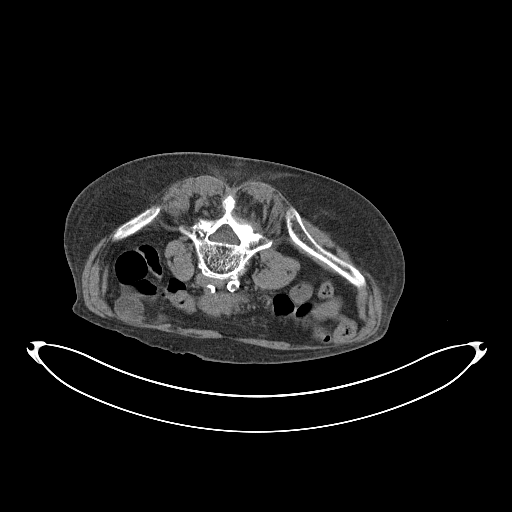
[im 30/37  lung]
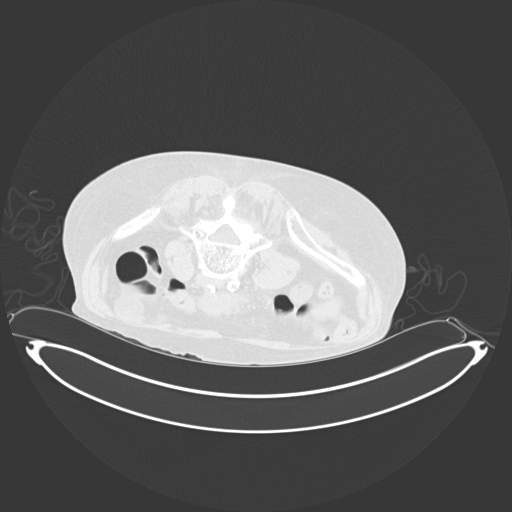
[im 32/37  lung]
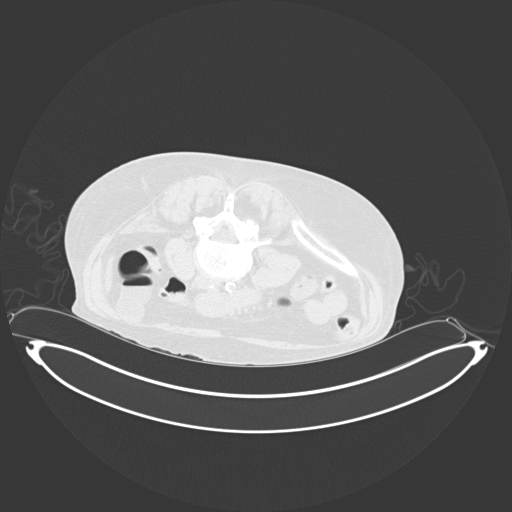
[im 35/37  lung]
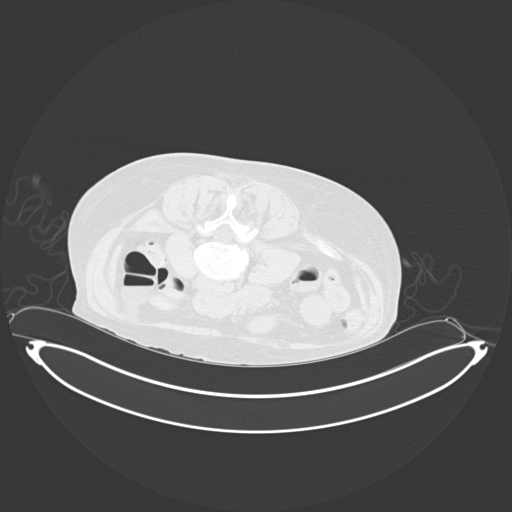

[15 of 31 positions shown; findings below may reference images not displayed]

EXAM:
ULTRASOUND GUIDED BIOPSY OF ABDOMINAL WALL FAT

ANESTHESIA/SEDATION:
The procedure followed a bone marrow biopsy with conscious sedation
and no additional sedation medication was given for the fat pad
biopsy.

PROCEDURE:
The procedure risks, benefits, and alternatives were explained to
the patient. Questions regarding the procedure were encouraged and
answered. The patient understands and consents to the procedure. A
time-out was performed prior to initiating the procedure.

The anterior abdominal wall was prepped with chlorhexidine in a
sterile fashion, and a sterile drape was applied covering the
operative field. A sterile gown and sterile gloves were used for the
procedure. Local anesthesia was provided with 1% Lidocaine.

Under ultrasound guidance, a 10 gauge, 10 cm length bone biopsy
needle was advanced into the subcutaneous fat of the lower abdominal
wall. The inner trocar was removed and aspiration of fat performed
through the needle while performing suction with a syringe as the
needle was moved back and forth in the abdominal wall fat. This
maneuver was performed twice in obtaining fat globules that were
then placed in formalin.

COMPLICATIONS:
None
FINDINGS: Sufficient abdominal wall fat was obtained.
IMPRESSION: Ultrasound-guided abdominal fat pad biopsy.

## 2021-01-24 IMAGING — US CT BIOPSY AND ASPIRATION BONE MARROW
1 series · 6 of 6 positions shown · non-contrast
Comparison: none

CLINICAL DATA: Monoclonal gammopathy and need for bone marrow
biopsy.

[Series 1: ct biopsy and aspiration bone marrow · 0.07mm/px · 6 of 6 slices shown]
[im 1/6]
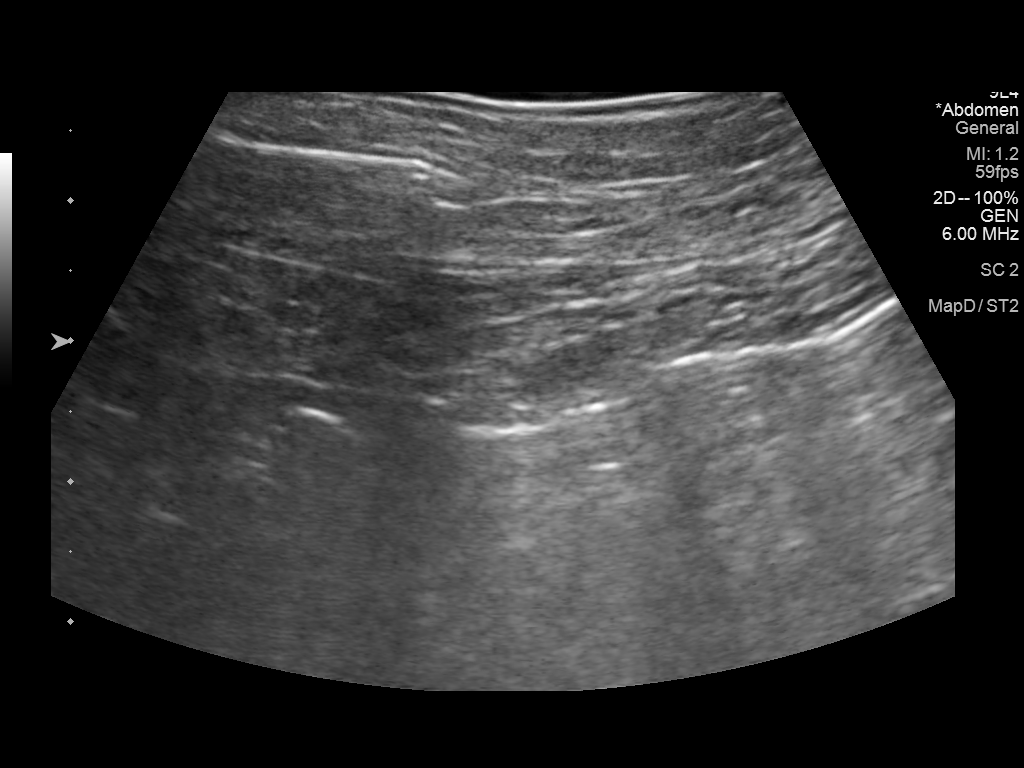
[im 2/6]
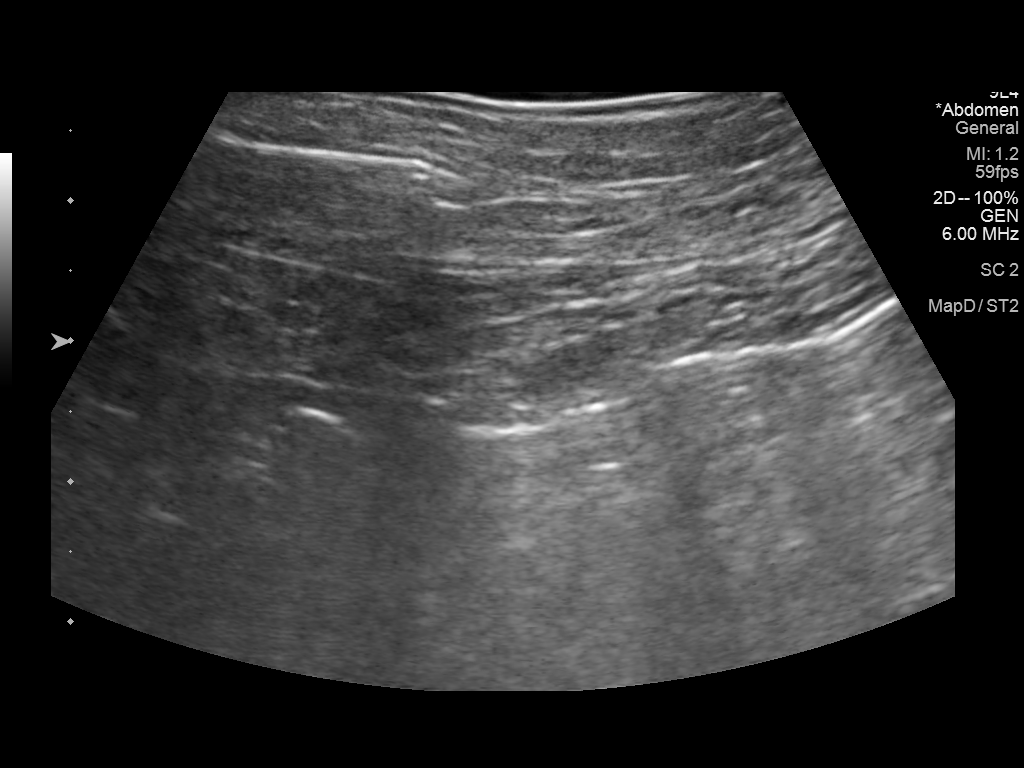
[im 3/6]
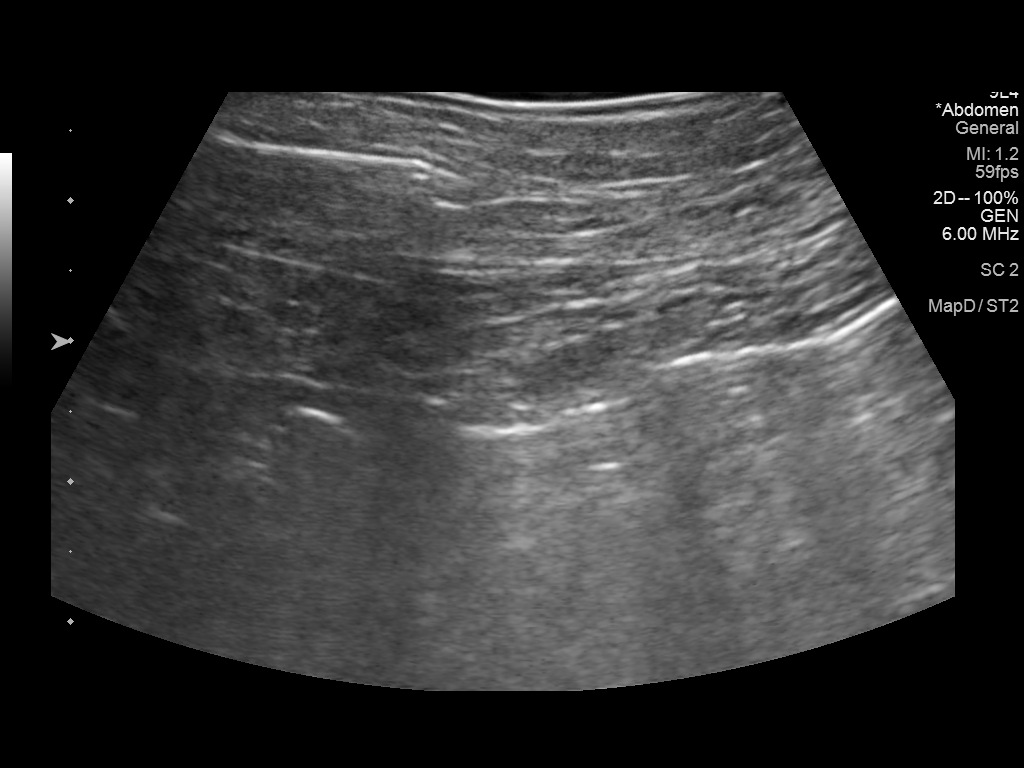
[im 4/6]
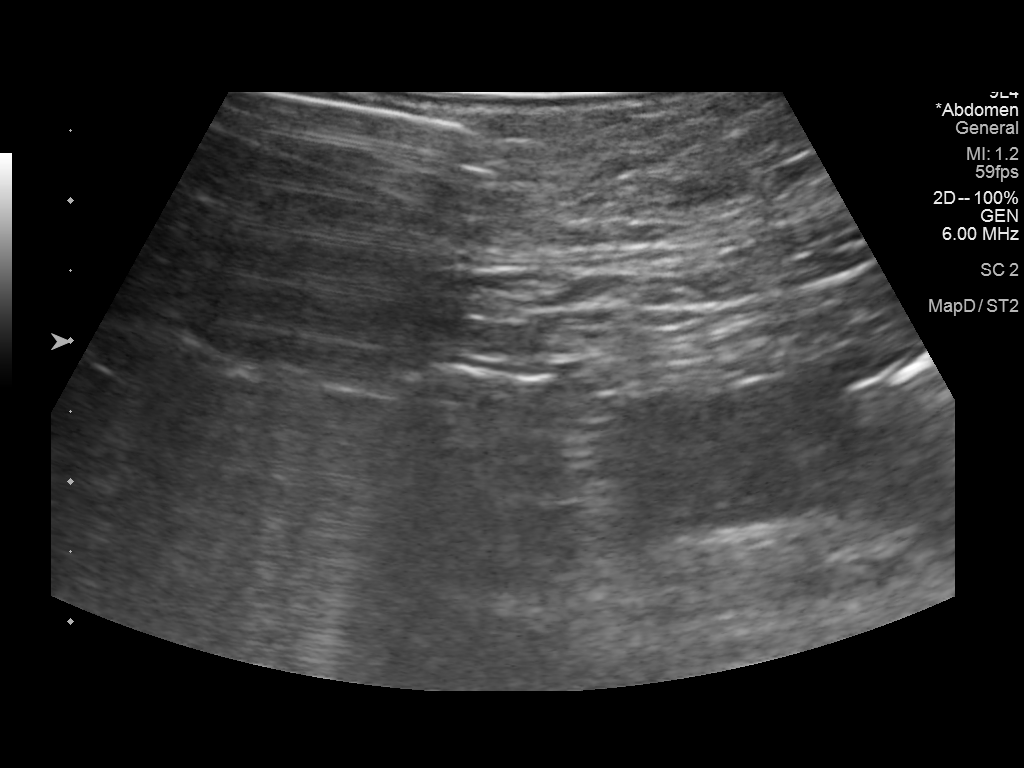
[im 5/6]
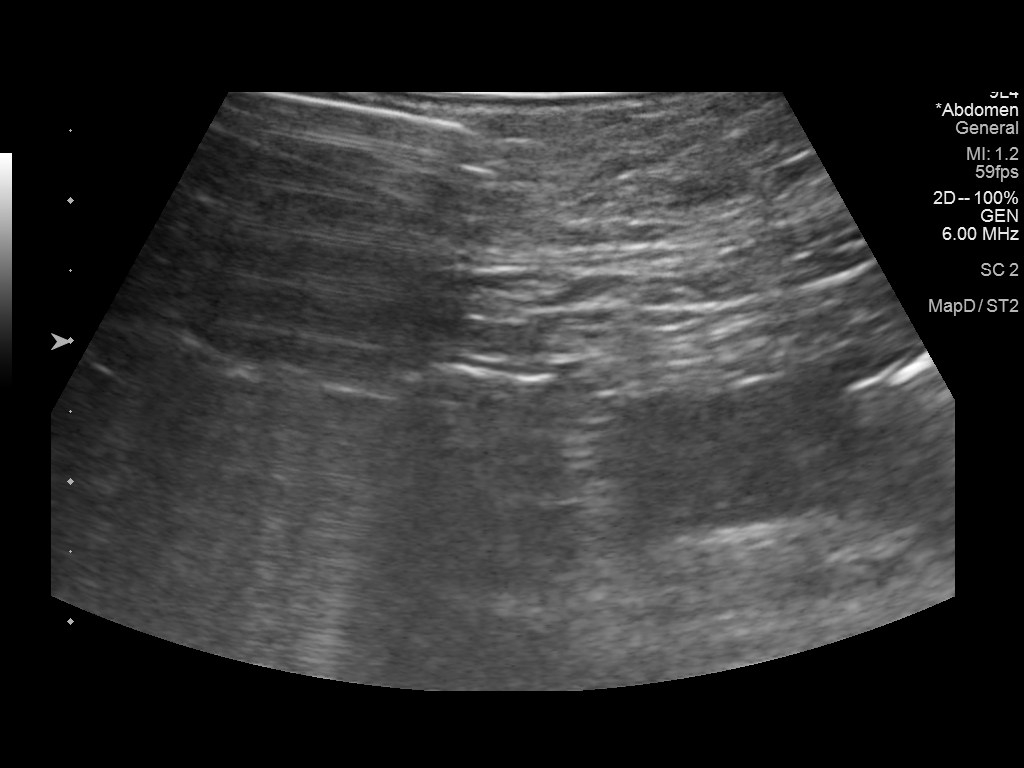
[im 6/6]
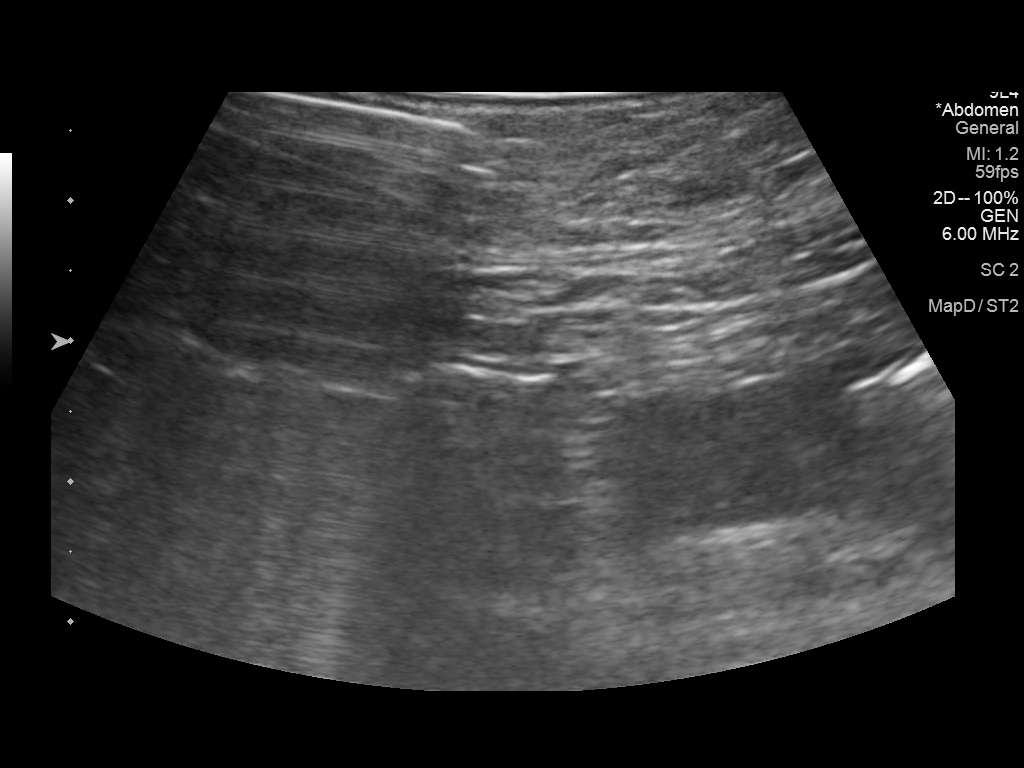

[6 of 6 positions shown; findings below may reference images not displayed]

EXAM:
CT GUIDED BONE MARROW ASPIRATION AND BIOPSY

ANESTHESIA/SEDATION:
Versed 3.0 mg IV, Fentanyl 100 mcg IV

Total Moderate Sedation Time:   11 minutes.

The patient's level of consciousness and physiologic status were
continuously monitored during the procedure by Radiology nursing.

PROCEDURE:
The procedure risks, benefits, and alternatives were explained to
the patient. Questions regarding the procedure were encouraged and
answered. The patient understands and consents to the procedure. A
time out was performed prior to initiating the procedure.

The right gluteal region was prepped with chlorhexidine. Sterile
gown and sterile gloves were used for the procedure. Local
anesthesia was provided with 1% Lidocaine.

Under CT guidance, an 11 gauge On Control bone cutting needle was
advanced from a posterior approach into the right iliac bone. Needle
positioning was confirmed with CT. Initial non heparinized and
heparinized aspirate samples were obtained of bone marrow. Core
biopsy was performed via the On Control drill needle.

COMPLICATIONS:
None
FINDINGS: Inspection of initial aspirate did reveal visible particles. Intact
core biopsy sample was obtained.
IMPRESSION: CT guided bone marrow biopsy of right posterior iliac bone with both
aspirate and core samples obtained.

## 2021-01-24 MED ORDER — SODIUM CHLORIDE 0.9 % IV SOLN: INTRAVENOUS | Status: DC

## 2021-01-24 MED ORDER — MIDAZOLAM HCL 2 MG/2ML IJ SOLN
INTRAMUSCULAR | Status: AC
  Filled 2021-01-24: qty 4

## 2021-01-24 MED ORDER — LIDOCAINE HCL (PF) 1 % IJ SOLN
INTRAMUSCULAR | Status: AC | PRN
  Administered 2021-01-24: 5 mL
  Administered 2021-01-24: 10 mL

## 2021-01-24 MED ORDER — FENTANYL CITRATE (PF) 100 MCG/2ML IJ SOLN
INTRAMUSCULAR | Status: AC | PRN
  Administered 2021-01-24 (×2): 50 ug via INTRAVENOUS

## 2021-01-24 MED ORDER — MIDAZOLAM HCL 2 MG/2ML IJ SOLN
INTRAMUSCULAR | Status: AC | PRN
  Administered 2021-01-24 (×4): 1 mg via INTRAVENOUS

## 2021-01-24 MED ORDER — FENTANYL CITRATE (PF) 100 MCG/2ML IJ SOLN
INTRAMUSCULAR | Status: AC
  Filled 2021-01-24: qty 2
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
## 2021-01-24 NOTE — Discharge Instructions (Signed)
Please call Interventional Radiology clinic [phone] with any questions or concerns.  You may remove your dressing and shower tomorrow.   Needle Biopsy, Care After These instructions tell you how to care for yourself after your procedure. Your doctor may also give you more specific instructions. Call your doctor if you have any problems or questions. What can I expect after the procedure? After the procedure, it is common to have:  Soreness.  Bruising.  Mild pain. Follow these instructions at home:  Return to your normal activities as told by your doctor. Ask your doctor what activities are safe for you.  Take over-the-counter and prescription medicines only as told by your doctor.  Wash your hands with soap and water before you change your bandage (dressing). If you cannot use soap and water, use hand sanitizer.  Follow instructions from your doctor about: ? How to take care of your puncture site. ? When and how to change your bandage. ? When to remove your bandage.  Check your puncture site every day for signs of infection. Watch for: ? Redness, swelling, or pain. ? Fluid or blood. ? Pus or a bad smell. ? Warmth.  Do not take baths, swim, or use a hot tub until your doctor approves. Ask your doctor if you may take showers. You may only be allowed to take sponge baths.  Keep all follow-up visits as told by your doctor. This is important.   Contact a doctor if you have:  A fever.  Redness, swelling, or pain at the puncture site, and it lasts longer than a few days.  Fluid, blood, or pus coming from the puncture site.  Warmth coming from the puncture site. Get help right away if:  You have a lot of bleeding from the puncture site. Summary  After the procedure, it is common to have soreness, bruising, or mild pain at the puncture site.  Check your puncture site every day for signs of infection, such as redness, swelling, or pain.  Get help right away if you  have severe bleeding from your puncture site. This information is not intended to replace advice given to you by your health care provider. Make sure you discuss any questions you have with your health care provider. Document Revised: 06/09/2020 Document Reviewed: 06/09/2020 Elsevier Patient Education  [place].   Bone Marrow Aspiration and Bone Marrow Biopsy, Adult, Care After This sheet gives you information about how to care for yourself after your procedure. Your health care provider may also give you more specific instructions. If you have problems or questions, contact your health care provider. What can I expect after the procedure? After the procedure, it is common to have:  Mild pain and tenderness.  Swelling.  Bruising. Follow these instructions at home: Puncture site care  Follow instructions from your health care provider about how to take care of the puncture site. Make sure you: ? Wash your hands with soap and water before and after you change your bandage (dressing). If soap and water are not available, use hand sanitizer. ? Change your dressing as told by your health care provider.  Check your puncture site every day for signs of infection. Check for: ? More redness, swelling, or pain. ? Fluid or blood. ? Warmth. ? Pus or a bad smell.   Activity  Return to your normal activities as told by your health care provider. Ask your health care provider what activities are safe for you.  Do not lift anything that  is heavier than 10 lb (4.5 kg), or the limit that you are told, until your health care provider says that it is safe.  Do not drive for 24 hours if you were given a sedative during your procedure. General instructions  Take over-the-counter and prescription medicines only as told by your health care provider.  Do not take baths, swim, or use a hot tub until your health care provider approves. Ask your health care provider if you may take showers. You  may only be allowed to take sponge baths.  If directed, put ice on the affected area. To do this: ? Put ice in a plastic bag. ? Place a towel between your skin and the bag. ? Leave the ice on for 20 minutes, 2-3 times a day.  Keep all follow-up visits as told by your health care provider. This is important.   Contact a health care provider if:  Your pain is not controlled with medicine.  You have a fever.  You have more redness, swelling, or pain around the puncture site.  You have fluid or blood coming from the puncture site.  Your puncture site feels warm to the touch.  You have pus or a bad smell coming from the puncture site. Summary  After the procedure, it is common to have mild pain, tenderness, swelling, and bruising.  Follow instructions from your health care provider about how to take care of the puncture site and what activities are safe for you.  Take over-the-counter and prescription medicines only as told by your health care provider.  Contact a health care provider if you have any signs of infection, such as fluid or blood coming from the puncture site. This information is not intended to replace advice given to you by your health care provider. Make sure you discuss any questions you have with your health care provider. Document Revised: 04/28/2019 Document Reviewed: 04/28/2019 Elsevier Patient Education  2021 [place].    Moderate Conscious Sedation, Adult, Care After This sheet gives you information about how to care for yourself after your procedure. Your health care provider may also give you more specific instructions. If you have problems or questions, contact your health care provider. What can I expect after the procedure? After the procedure, it is common to have:  Sleepiness for several hours.  Impaired judgment for several hours.  Difficulty with balance.  Vomiting if you eat too soon. Follow these instructions at home: For the time  period you were told by your health care provider:  Rest.  Do not participate in activities where you could fall or become injured.  Do not drive or use machinery.  Do not drink alcohol.  Do not take sleeping pills or medicines that cause drowsiness.  Do not make important decisions or sign legal documents.  Do not take care of children on your own.      Eating and drinking  Follow the diet recommended by your health care provider.  Drink enough fluid to keep your urine pale yellow.  If you vomit: ? Drink water, juice, or soup when you can drink without vomiting. ? Make sure you have little or no nausea before eating solid foods.   General instructions  Take over-the-counter and prescription medicines only as told by your health care provider.  Have a responsible adult stay with you for the time you are told. It is important to have someone help care for you until you are awake and alert.  Do not  smoke.  Keep all follow-up visits as told by your health care provider. This is important. Contact a health care provider if:  You are still sleepy or having trouble with balance after 24 hours.  You feel light-headed.  You keep feeling nauseous or you keep vomiting.  You develop a rash.  You have a fever.  You have redness or swelling around the IV site. Get help right away if:  You have trouble breathing.  You have new-onset confusion at home. Summary  After the procedure, it is common to feel sleepy, have impaired judgment, or feel nauseous if you eat too soon.  Rest after you get home. Know the things you should not do after the procedure.  Follow the diet recommended by your health care provider and drink enough fluid to keep your urine pale yellow.  Get help right away if you have trouble breathing or new-onset confusion at home. This information is not intended to replace advice given to you by your health care provider. Make sure you discuss any questions  you have with your health care provider. Document Revised: 04/08/2020 Document Reviewed: 11/05/2019 Elsevier Patient Education  2021 [employer].

## 2021-01-24 NOTE — Sedation Documentation (Signed)
End bone marrow bx

## 2021-01-24 NOTE — Sedation Documentation (Signed)
Start fat pad bx

## 2021-01-24 NOTE — Procedures (Signed)
Interventional Radiology Procedure Note  Procedure: CT guided bone marrow aspiration and biopsy; abdominal fat pad biopsy  Complications: None  EBL: < 10 mL  Findings: Aspirate and core biopsy performed of bone marrow in right iliac bone.  Aspirate biopsy of anterior abdominal wall fat also performed for amyloidosis work up.  Plan: Bedrest supine x 1 hrs  Darletta Noblett T. Kathlene Cote, M.D Pager:  [phone]

## 2021-01-25 ENCOUNTER — Inpatient Hospital Stay: Payer: Medicare Other | Admitting: Hematology and Oncology

## 2021-01-25 ENCOUNTER — Inpatient Hospital Stay: Payer: Medicare Other

## 2021-01-25 ENCOUNTER — Telehealth: Payer: Self-pay | Admitting: Hematology and Oncology

## 2021-01-25 NOTE — Telephone Encounter (Signed)
Called pt per 1/24 sch msg - unable to reach pt .left message for patient with appt date and time

## 2021-01-26 ENCOUNTER — Other Ambulatory Visit: Payer: Self-pay

## 2021-01-26 LAB — SURGICAL PATHOLOGY

## 2021-01-31 ENCOUNTER — Encounter (HOSPITAL_COMMUNITY): Payer: Self-pay | Admitting: Hematology and Oncology

## 2021-01-31 ENCOUNTER — Encounter (HOSPITAL_COMMUNITY): Payer: Self-pay

## 2021-01-31 ENCOUNTER — Ambulatory Visit (HOSPITAL_COMMUNITY)
Admission: RE | Admit: 2021-01-31 | Discharge: 2021-01-31 | Disposition: A | Discharge status: Home / Self Care | Payer: Medicare Other | Source: Ambulatory Visit | Attending: Interventional Radiology | Admitting: Interventional Radiology

## 2021-01-31 DIAGNOSIS — D472 Monoclonal gammopathy: Secondary | ICD-10-CM | POA: Diagnosis present

## 2021-01-31 DIAGNOSIS — E859 Amyloidosis, unspecified: Secondary | ICD-10-CM | POA: Diagnosis not present

## 2021-01-31 DIAGNOSIS — D75839 Thrombocytosis, unspecified: Secondary | ICD-10-CM | POA: Insufficient documentation

## 2021-02-03 ENCOUNTER — Other Ambulatory Visit: Payer: Self-pay | Admitting: *Deleted

## 2021-02-03 ENCOUNTER — Other Ambulatory Visit: Payer: Self-pay

## 2021-02-03 ENCOUNTER — Inpatient Hospital Stay: Payer: Medicare Other | Attending: Hematology and Oncology

## 2021-02-03 ENCOUNTER — Inpatient Hospital Stay (HOSPITAL_BASED_OUTPATIENT_CLINIC_OR_DEPARTMENT_OTHER): Payer: Medicare Other | Admitting: Hematology and Oncology

## 2021-02-03 VITALS — BP 171/76 | HR 100 | Temp 97.7°F | Resp 18 | Ht 62.0 in | Wt 134.5 lb

## 2021-02-03 DIAGNOSIS — Z5111 Encounter for antineoplastic chemotherapy: Secondary | ICD-10-CM | POA: Insufficient documentation

## 2021-02-03 DIAGNOSIS — Z90710 Acquired absence of both cervix and uterus: Secondary | ICD-10-CM | POA: Diagnosis not present

## 2021-02-03 DIAGNOSIS — Z87891 Personal history of nicotine dependence: Secondary | ICD-10-CM | POA: Insufficient documentation

## 2021-02-03 DIAGNOSIS — Z79899 Other long term (current) drug therapy: Secondary | ICD-10-CM | POA: Insufficient documentation

## 2021-02-03 DIAGNOSIS — R1900 Intra-abdominal and pelvic swelling, mass and lump, unspecified site: Secondary | ICD-10-CM | POA: Diagnosis not present

## 2021-02-03 DIAGNOSIS — C9000 Multiple myeloma not having achieved remission: Secondary | ICD-10-CM | POA: Diagnosis not present

## 2021-02-03 DIAGNOSIS — B965 Pseudomonas (aeruginosa) (mallei) (pseudomallei) as the cause of diseases classified elsewhere: Secondary | ICD-10-CM | POA: Insufficient documentation

## 2021-02-03 DIAGNOSIS — N390 Urinary tract infection, site not specified: Secondary | ICD-10-CM | POA: Diagnosis not present

## 2021-02-03 DIAGNOSIS — E8581 Light chain (AL) amyloidosis: Secondary | ICD-10-CM | POA: Insufficient documentation

## 2021-02-03 DIAGNOSIS — D472 Monoclonal gammopathy: Secondary | ICD-10-CM

## 2021-02-03 DIAGNOSIS — Z5112 Encounter for antineoplastic immunotherapy: Secondary | ICD-10-CM | POA: Diagnosis present

## 2021-02-03 DIAGNOSIS — R803 Bence Jones proteinuria: Secondary | ICD-10-CM

## 2021-02-03 DIAGNOSIS — Z801 Family history of malignant neoplasm of trachea, bronchus and lung: Secondary | ICD-10-CM | POA: Diagnosis not present

## 2021-02-03 LAB — CBC WITH DIFFERENTIAL (CANCER CENTER ONLY)
Abs Immature Granulocytes: 0.02 10*3/uL (ref 0.00–0.07)
Basophils Absolute: 0 10*3/uL (ref 0.0–0.1)
Basophils Relative: 0 %
Eosinophils Absolute: 0 10*3/uL (ref 0.0–0.5)
Eosinophils Relative: 0 %
HCT: 34 % — ABNORMAL LOW (ref 36.0–46.0)
Hemoglobin: 12 g/dL (ref 12.0–15.0)
Immature Granulocytes: 0 %
Lymphocytes Relative: 20 %
Lymphs Abs: 1.6 10*3/uL (ref 0.7–4.0)
MCH: 33 pg (ref 26.0–34.0)
MCHC: 35.3 g/dL (ref 30.0–36.0)
MCV: 93.4 fL (ref 80.0–100.0)
Monocytes Absolute: 0.7 10*3/uL (ref 0.1–1.0)
Monocytes Relative: 9 %
Neutro Abs: 5.8 10*3/uL (ref 1.7–7.7)
Neutrophils Relative %: 71 %
Platelet Count: 411 10*3/uL — ABNORMAL HIGH (ref 150–400)
RBC: 3.64 MIL/uL — ABNORMAL LOW (ref 3.87–5.11)
RDW: 14.1 % (ref 11.5–15.5)
WBC Count: 8.3 10*3/uL (ref 4.0–10.5)
nRBC: 0 % (ref 0.0–0.2)

## 2021-02-03 LAB — CMP (CANCER CENTER ONLY)
ALT: 14 U/L (ref 0–44)
AST: 27 U/L (ref 15–41)
Albumin: 2.7 g/dL — ABNORMAL LOW (ref 3.5–5.0)
Alkaline Phosphatase: 78 U/L (ref 38–126)
Anion gap: 7 (ref 5–15)
BUN: 13 mg/dL (ref 8–23)
CO2: 26 mmol/L (ref 22–32)
Calcium: 8.9 mg/dL (ref 8.9–10.3)
Chloride: 96 mmol/L — ABNORMAL LOW (ref 98–111)
Creatinine: 0.79 mg/dL (ref 0.44–1.00)
GFR, Estimated: >60 mL/min (ref >60)
Glucose, Bld: 115 mg/dL — ABNORMAL HIGH (ref 70–99)
Potassium: 4.5 mmol/L (ref 3.5–5.1)
Sodium: 129 mmol/L — ABNORMAL LOW (ref 135–145)
Total Bilirubin: 0.5 mg/dL (ref 0.3–1.2)
Total Protein: 5.7 g/dL — ABNORMAL LOW (ref 6.5–8.1)

## 2021-02-03 LAB — LACTATE DEHYDROGENASE: LDH: 273 U/L — ABNORMAL HIGH (ref 98–192)

## 2021-02-03 MED ORDER — PROCHLORPERAZINE MALEATE 10 MG PO TABS: 10.0000 mg | ORAL_TABLET | Freq: Four times a day (QID) | ORAL | 0 refills | Status: DC | PRN

## 2021-02-03 MED ORDER — ALBUTEROL SULFATE HFA 108 (90 BASE) MCG/ACT IN AERS: 2.0000 | INHALATION_SPRAY | Freq: Four times a day (QID) | RESPIRATORY_TRACT | 0 refills | Status: DC | PRN

## 2021-02-03 MED ORDER — ONDANSETRON HCL 8 MG PO TABS: 8.0000 mg | ORAL_TABLET | Freq: Three times a day (TID) | ORAL | 0 refills | Status: DC | PRN

## 2021-02-03 MED ORDER — ESOMEPRAZOLE MAGNESIUM 40 MG PO CPDR: 40.0000 mg | DELAYED_RELEASE_CAPSULE | Freq: Every day | ORAL | 3 refills | Status: DC

## 2021-02-03 MED ORDER — ACYCLOVIR 400 MG PO TABS: 400.0000 mg | ORAL_TABLET | Freq: Two times a day (BID) | ORAL | 3 refills | Status: DC

## 2021-02-03 NOTE — Progress Notes (Signed)
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

## 2021-02-03 NOTE — Progress Notes (Signed)
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: D[facility]CyB[facility] (Daratumumab/hyaluronidase SUBQ + Cyclophosphamide PO + Bortezomib SUBQ + Dexamethasone PO/IV) q28 Days for up to 6 Cycles Followed by Daratumumab/hyaluronidase SUBQ D1 q28 Days for up to 2 Years TREATMENT RESPONSE: Unable to Evaluate  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     Cycles 1 and 2: A cycle is every 28 days:     Cyclophosphamide      Dexamethasone      Bortezomib      Daratumumab and hyaluronidase-fihj    Cycles 3 through 6: A cycle is every 28 days:     Cyclophosphamide      Dexamethasone      Bortezomib      Daratumumab and hyaluronidase-fihj    Cycles 7 through 24: A cycle is every 28 days:     Daratumumab and hyaluronidase-fihj   **Always confirm dose/schedule in your pharmacy ordering system**  Patient Characteristics: Primary AL Amyloidosis, First Line, 

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
## 2021-02-06 ENCOUNTER — Telehealth: Payer: Self-pay | Admitting: Hematology and Oncology

## 2021-02-06 NOTE — Telephone Encounter (Signed)
Scheduled appts per 2/11 los. Pt confirmed next appt date and time.

## 2021-02-07 ENCOUNTER — Encounter: Payer: Self-pay | Admitting: Hematology and Oncology

## 2021-02-07 ENCOUNTER — Encounter (HOSPITAL_COMMUNITY): Payer: Self-pay | Admitting: Hematology and Oncology

## 2021-02-07 ENCOUNTER — Other Ambulatory Visit: Payer: Self-pay

## 2021-02-07 ENCOUNTER — Inpatient Hospital Stay: Payer: Medicare Other

## 2021-02-08 ENCOUNTER — Other Ambulatory Visit: Payer: Medicare Other

## 2021-02-08 ENCOUNTER — Other Ambulatory Visit: Payer: Self-pay | Admitting: Hematology and Oncology

## 2021-02-08 DIAGNOSIS — E8581 Light chain (AL) amyloidosis: Secondary | ICD-10-CM

## 2021-02-09 ENCOUNTER — Inpatient Hospital Stay: Payer: Medicare Other

## 2021-02-09 ENCOUNTER — Other Ambulatory Visit: Payer: Self-pay

## 2021-02-09 VITALS — BP 151/71 | HR 100 | Temp 98.1°F | Resp 18 | Ht 62.0 in | Wt 130.2 lb

## 2021-02-09 DIAGNOSIS — J9601 Acute respiratory failure with hypoxia: Secondary | ICD-10-CM | POA: Diagnosis not present

## 2021-02-09 DIAGNOSIS — Z5112 Encounter for antineoplastic immunotherapy: Secondary | ICD-10-CM | POA: Diagnosis present

## 2021-02-09 DIAGNOSIS — J9690 Respiratory failure, unspecified, unspecified whether with hypoxia or hypercapnia: Secondary | ICD-10-CM | POA: Diagnosis not present

## 2021-02-09 DIAGNOSIS — E8581 Light chain (AL) amyloidosis: Secondary | ICD-10-CM

## 2021-02-09 DIAGNOSIS — Z79899 Other long term (current) drug therapy: Secondary | ICD-10-CM | POA: Diagnosis not present

## 2021-02-09 DIAGNOSIS — N390 Urinary tract infection, site not specified: Secondary | ICD-10-CM | POA: Diagnosis not present

## 2021-02-09 DIAGNOSIS — Z5111 Encounter for antineoplastic chemotherapy: Secondary | ICD-10-CM | POA: Diagnosis present

## 2021-02-09 DIAGNOSIS — Z90710 Acquired absence of both cervix and uterus: Secondary | ICD-10-CM | POA: Diagnosis not present

## 2021-02-09 DIAGNOSIS — C9000 Multiple myeloma not having achieved remission: Secondary | ICD-10-CM | POA: Diagnosis not present

## 2021-02-09 DIAGNOSIS — Z801 Family history of malignant neoplasm of trachea, bronchus and lung: Secondary | ICD-10-CM | POA: Diagnosis not present

## 2021-02-09 DIAGNOSIS — B965 Pseudomonas (aeruginosa) (mallei) (pseudomallei) as the cause of diseases classified elsewhere: Secondary | ICD-10-CM | POA: Diagnosis not present

## 2021-02-09 DIAGNOSIS — Z87891 Personal history of nicotine dependence: Secondary | ICD-10-CM | POA: Diagnosis not present

## 2021-02-09 DIAGNOSIS — R1900 Intra-abdominal and pelvic swelling, mass and lump, unspecified site: Secondary | ICD-10-CM | POA: Diagnosis not present

## 2021-02-09 LAB — CMP (CANCER CENTER ONLY)
ALT: 12 U/L (ref 0–44)
AST: 21 U/L (ref 15–41)
Albumin: 2.5 g/dL — ABNORMAL LOW (ref 3.5–5.0)
Alkaline Phosphatase: 85 U/L (ref 38–126)
Anion gap: 7 (ref 5–15)
BUN: 9 mg/dL (ref 8–23)
CO2: 23 mmol/L (ref 22–32)
Calcium: 8.5 mg/dL — ABNORMAL LOW (ref 8.9–10.3)
Chloride: 97 mmol/L — ABNORMAL LOW (ref 98–111)
Creatinine: 0.78 mg/dL (ref 0.44–1.00)
GFR, Estimated: >60 mL/min (ref >60)
Glucose, Bld: 118 mg/dL — ABNORMAL HIGH (ref 70–99)
Potassium: 4 mmol/L (ref 3.5–5.1)
Sodium: 127 mmol/L — ABNORMAL LOW (ref 135–145)
Total Bilirubin: 0.4 mg/dL (ref 0.3–1.2)
Total Protein: 5.7 g/dL — ABNORMAL LOW (ref 6.5–8.1)

## 2021-02-09 LAB — CBC WITH DIFFERENTIAL (CANCER CENTER ONLY)
Abs Immature Granulocytes: 0.02 10*3/uL (ref 0.00–0.07)
Basophils Absolute: 0 10*3/uL (ref 0.0–0.1)
Basophils Relative: 1 %
Eosinophils Absolute: 0.1 10*3/uL (ref 0.0–0.5)
Eosinophils Relative: 2 %
HCT: 32.6 % — ABNORMAL LOW (ref 36.0–46.0)
Hemoglobin: 11.8 g/dL — ABNORMAL LOW (ref 12.0–15.0)
Immature Granulocytes: 0 %
Lymphocytes Relative: 16 %
Lymphs Abs: 1.1 10*3/uL (ref 0.7–4.0)
MCH: 33.1 pg (ref 26.0–34.0)
MCHC: 36.2 g/dL — ABNORMAL HIGH (ref 30.0–36.0)
MCV: 91.3 fL (ref 80.0–100.0)
Monocytes Absolute: 0.7 10*3/uL (ref 0.1–1.0)
Monocytes Relative: 10 %
Neutro Abs: 4.7 10*3/uL (ref 1.7–7.7)
Neutrophils Relative %: 71 %
Platelet Count: 422 10*3/uL — ABNORMAL HIGH (ref 150–400)
RBC: 3.57 MIL/uL — ABNORMAL LOW (ref 3.87–5.11)
RDW: 14 % (ref 11.5–15.5)
WBC Count: 6.6 10*3/uL (ref 4.0–10.5)
nRBC: 0 % (ref 0.0–0.2)

## 2021-02-09 LAB — TYPE AND SCREEN
ABO/RH(D): A POS
Antibody Screen: NEGATIVE

## 2021-02-09 LAB — PRETREATMENT RBC PHENOTYPE: DAT, IgG: NEGATIVE

## 2021-02-09 LAB — LACTATE DEHYDROGENASE: LDH: 246 U/L — ABNORMAL HIGH (ref 98–192)

## 2021-02-09 MED ORDER — MONTELUKAST SODIUM 10 MG PO TABS
ORAL_TABLET | ORAL | Status: AC
  Filled 2021-02-09: qty 1

## 2021-02-09 MED ORDER — ACETAMINOPHEN 325 MG PO TABS
ORAL_TABLET | ORAL | Status: AC
  Filled 2021-02-09: qty 2

## 2021-02-09 MED ORDER — DARATUMUMAB-HYALURONIDASE-FIHJ 1800-30000 MG-UT/15ML ~~LOC~~ SOLN
1800.0000 mg | Freq: Once | SUBCUTANEOUS | Status: AC
  Administered 2021-02-09: 1800 mg via SUBCUTANEOUS
  Filled 2021-02-09: qty 15

## 2021-02-09 MED ORDER — SODIUM CHLORIDE 0.9 % IV SOLN
300.0000 mg/m2 | Freq: Once | INTRAVENOUS | Status: AC
  Administered 2021-02-09: 480 mg via INTRAVENOUS
  Filled 2021-02-09: qty 24

## 2021-02-09 MED ORDER — DEXAMETHASONE 4 MG PO TABS
ORAL_TABLET | ORAL | Status: AC
  Filled 2021-02-09: qty 10

## 2021-02-09 MED ORDER — DEXAMETHASONE 4 MG PO TABS
40.0000 mg | ORAL_TABLET | Freq: Once | ORAL | Status: AC
  Administered 2021-02-09: 40 mg via ORAL

## 2021-02-09 MED ORDER — MONTELUKAST SODIUM 10 MG PO TABS
10.0000 mg | ORAL_TABLET | Freq: Once | ORAL | Status: AC
  Administered 2021-02-09: 10 mg via ORAL

## 2021-02-09 MED ORDER — SODIUM CHLORIDE 0.9 % IV SOLN
INTRAVENOUS | Status: DC
  Filled 2021-02-09: qty 250

## 2021-02-09 MED ORDER — ACETAMINOPHEN 325 MG PO TABS
650.0000 mg | ORAL_TABLET | Freq: Once | ORAL | Status: AC
  Administered 2021-02-09: 650 mg via ORAL

## 2021-02-09 MED ORDER — BORTEZOMIB CHEMO SQ INJECTION 3.5 MG (2.5MG/ML)
1.3000 mg/m2 | Freq: Once | INTRAMUSCULAR | Status: AC
  Administered 2021-02-09: 2 mg via SUBCUTANEOUS
  Filled 2021-02-09: qty 0.8

## 2021-02-09 NOTE — Patient Instructions (Signed)
[facility] Discharge Instructions for Patients Receiving Chemotherapy  Today you received the following chemotherapy agents Cyclophosphamide (CYTOXAN), Bortezomib (VELCADE) & Daratumumab-hyaluronidase-fihj (DARZALEX FASPRO)  To help prevent nausea and vomiting after your treatment, we encourage you to take your nausea medication as prescribed.   If you develop nausea and vomiting that is not controlled by your nausea medication, call the clinic.   BELOW ARE SYMPTOMS THAT SHOULD BE REPORTED IMMEDIATELY:  *FEVER GREATER THAN 100.5 F  *CHILLS WITH OR WITHOUT FEVER  NAUSEA AND VOMITING THAT IS NOT CONTROLLED WITH YOUR NAUSEA MEDICATION  *UNUSUAL SHORTNESS OF BREATH  *UNUSUAL BRUISING OR BLEEDING  TENDERNESS IN MOUTH AND THROAT WITH OR WITHOUT PRESENCE OF ULCERS  *URINARY PROBLEMS  *BOWEL PROBLEMS  UNUSUAL RASH Items with * indicate a potential emergency and should be followed up as soon as possible.  Feel free to call the clinic should you have any questions or concerns. The clinic phone number is [phone].  Please show the [facility] at check-in to the Emergency Department and triage nurse.  Cyclophosphamide Injection What is this medicine? CYCLOPHOSPHAMIDE (sye kloe FOSS fa mide) is a chemotherapy drug. It slows the growth of cancer cells. This medicine is used to treat many types of cancer like lymphoma, myeloma, leukemia, breast cancer, and ovarian cancer, to name a few. This medicine may be used for other purposes; ask your health care provider or pharmacist if you have questions. COMMON BRAND NAME(S): Cytoxan, Neosar What should I tell my health care provider before I take this medicine? They need to know if you have any of these conditions:  heart disease  history of irregular heartbeat  infection  kidney disease  liver disease  low blood counts, like white cells, platelets, or red blood cells  on hemodialysis  recent or ongoing  radiation therapy  scarring or thickening of the lungs  trouble passing urine  an unusual or allergic reaction to cyclophosphamide, other medicines, foods, dyes, or preservatives  pregnant or trying to get pregnant  breast-feeding How should I use this medicine? This drug is usually given as an injection into a vein or muscle or by infusion into a vein. It is administered in a hospital or clinic by a specially trained health care professional. Talk to your pediatrician regarding the use of this medicine in children. Special care may be needed. Overdosage: If you think you have taken too much of this medicine contact a poison control center or emergency room at once. NOTE: This medicine is only for you. Do not share this medicine with others. What if I miss a dose? It is important not to miss your dose. Call your doctor or health care professional if you are unable to keep an appointment. What may interact with this medicine?  amphotericin B  azathioprine  certain antivirals for HIV or hepatitis  certain medicines for blood pressure, heart disease, irregular heart beat  certain medicines that treat or prevent blood clots like warfarin  certain other medicines for cancer  cyclosporine  etanercept  indomethacin  medicines that relax muscles for surgery  medicines to increase blood counts  metronidazole This list may not describe all possible interactions. Give your health care provider a list of all the medicines, herbs, non-prescription drugs, or dietary supplements you use. Also tell them if you smoke, drink alcohol, or use illegal drugs. Some items may interact with your medicine. What should I watch for while using this medicine? Your condition will be monitored carefully  while you are receiving this medicine. You may need blood work done while you are taking this medicine. Drink water or other fluids as directed. Urinate often, even at night. Some products may  contain alcohol. Ask your health care professional if this medicine contains alcohol. Be sure to tell all health care professionals you are taking this medicine. Certain medicines, like metronidazole and disulfiram, can cause an unpleasant reaction when taken with alcohol. The reaction includes flushing, headache, nausea, vomiting, sweating, and increased thirst. The reaction can last from 30 minutes to several hours. Do not become pregnant while taking this medicine or for 1 year after stopping it. Women should inform their health care professional if they wish to become pregnant or think they might be pregnant. Men should not father a child while taking this medicine and for 4 months after stopping it. There is potential for serious side effects to an unborn child. Talk to your health care professional for more information. Do not breast-feed an infant while taking this medicine or for 1 week after stopping it. This medicine has caused ovarian failure in some women. This medicine may make it more difficult to get pregnant. Talk to your health care professional if you are concerned about your fertility. This medicine has caused decreased sperm counts in some men. This may make it more difficult to father a child. Talk to your health care professional if you are concerned about your fertility. Call your health care professional for advice if you get a fever, chills, or sore throat, or other symptoms of a cold or flu. Do not treat yourself. This medicine decreases your body's ability to fight infections. Try to avoid being around people who are sick. Avoid taking medicines that contain aspirin, acetaminophen, ibuprofen, naproxen, or ketoprofen unless instructed by your health care professional. These medicines may hide a fever. Talk to your health care professional about your risk of cancer. You may be more at risk for certain types of cancer if you take this medicine. If you are going to need surgery or  other procedure, tell your health care professional that you are using this medicine. Be careful brushing or flossing your teeth or using a toothpick because you may get an infection or bleed more easily. If you have any dental work done, tell your dentist you are receiving this medicine. What side effects may I notice from receiving this medicine? Side effects that you should report to your doctor or health care professional as soon as possible:  allergic reactions like skin rash, itching or hives, swelling of the face, lips, or tongue  breathing problems  nausea, vomiting  signs and symptoms of bleeding such as bloody or black, tarry stools; red or dark brown urine; spitting up blood or brown material that looks like coffee grounds; red spots on the skin; unusual bruising or bleeding from the eyes, gums, or nose  signs and symptoms of heart failure like fast, irregular heartbeat, sudden weight gain; swelling of the ankles, feet, hands  signs and symptoms of infection like fever; chills; cough; sore throat; pain or trouble passing urine  signs and symptoms of kidney injury like trouble passing urine or change in the amount of urine  signs and symptoms of liver injury like dark yellow or brown urine; general ill feeling or flu-like symptoms; light-colored stools; loss of appetite; nausea; right upper belly pain; unusually weak or tired; yellowing of the eyes or skin Side effects that usually do not require medical attention (report to  your doctor or health care professional if they continue or are bothersome):  confusion  decreased hearing  diarrhea  facial flushing  hair loss  headache  loss of appetite  missed menstrual periods  signs and symptoms of low red blood cells or anemia such as unusually weak or tired; feeling faint or lightheaded; falls  skin discoloration This list may not describe all possible side effects. Call your doctor for medical advice about side  effects. You may report side effects to FDA at 1-800-FDA-1088. Where should I keep my medicine? This drug is given in a hospital or clinic and will not be stored at home. NOTE: This sheet is a summary. It may not cover all possible information. If you have questions about this medicine, talk to your doctor, pharmacist, or health care provider.  2021 Elsevier/Gold Standard (2019-09-14 09:53:29)  Bortezomib injection What is this medicine? BORTEZOMIB (bor TEZ oh mib) targets proteins in cancer cells and stops the cancer cells from growing. It treats multiple myeloma and mantle cell lymphoma. This medicine may be used for other purposes; ask your health care provider or pharmacist if you have questions. COMMON BRAND NAME(S): Velcade What should I tell my health care provider before I take this medicine? They need to know if you have any of these conditions:  dehydration  diabetes (high blood sugar)  heart disease  liver disease  tingling of the fingers or toes or other nerve disorder  an unusual or allergic reaction to bortezomib, mannitol, boron, other medicines, foods, dyes, or preservatives  pregnant or trying to get pregnant  breast-feeding How should I use this medicine? This medicine is injected into a vein or under the skin. It is given by a health care provider in a hospital or clinic setting. Talk to your health care provider about the use of this medicine in children. Special care may be needed. Overdosage: If you think you have taken too much of this medicine contact a poison control center or emergency room at once. NOTE: This medicine is only for you. Do not share this medicine with others. What if I miss a dose? Keep appointments for follow-up doses. It is important not to miss your dose. Call your health care provider if you are unable to keep an appointment. What may interact with this medicine? This medicine may interact with the following  medications:  ketoconazole  rifampin This list may not describe all possible interactions. Give your health care provider a list of all the medicines, herbs, non-prescription drugs, or dietary supplements you use. Also tell them if you smoke, drink alcohol, or use illegal drugs. Some items may interact with your medicine. What should I watch for while using this medicine? Your condition will be monitored carefully while you are receiving this medicine. You may need blood work done while you are taking this medicine. You may get drowsy or dizzy. Do not drive, use machinery, or do anything that needs mental alertness until you know how this medicine affects you. Do not stand up or sit up quickly, especially if you are an older patient. This reduces the risk of dizzy or fainting spells This medicine may increase your risk of getting an infection. Call your health care provider for advice if you get a fever, chills, sore throat, or other symptoms of a cold or flu. Do not treat yourself. Try to avoid being around people who are sick. Check with your health care provider if you have severe diarrhea, nausea, and vomiting, or  if you sweat a lot. The loss of too much body fluid may make it dangerous for you to take this medicine. Do not become pregnant while taking this medicine or for 7 months after stopping it. Women should inform their health care provider if they wish to become pregnant or think they might be pregnant. Men should not father a child while taking this medicine and for 4 months after stopping it. There is a potential for serious harm to an unborn child. Talk to your health care provider for more information. Do not breast-feed an infant while taking this medicine or for 2 months after stopping it. This medicine may make it more difficult to get pregnant or father a child. Talk to your health care provider if you are concerned about your fertility. What side effects may I notice from receiving  this medicine? Side effects that you should report to your doctor or health care professional as soon as possible:  allergic reactions (skin rash; itching or hives; swelling of the face, lips, or tongue)  bleeding (bloody or black, tarry stools; red or dark brown urine; spitting up blood or brown material that looks like coffee grounds; red spots on the skin; unusual bruising or bleeding from the eye, gums, or nose)  blurred vision or changes in vision  confusion  constipation  headache  heart failure (trouble breathing; fast, irregular heartbeat; sudden weight gain; swelling of the ankles, feet, hands)  infection (fever, chills, cough, sore throat, pain or trouble passing urine)  lack or loss of appetite  liver injury (dark yellow or brown urine; general ill feeling or flu-like symptoms; loss of appetite, right upper belly pain; yellowing of the eyes or skin)  low blood pressure (dizziness; feeling faint or lightheaded, falls; unusually weak or tired)  muscle cramps  pain, redness, or irritation at site where injected  pain, tingling, numbness in the hands or feet  seizures  trouble breathing  unusual bruising or bleeding Side effects that usually do not require medical attention (report to your doctor or health care professional if they continue or are bothersome):  diarrhea  nausea  stomach pain  trouble sleeping  vomiting This list may not describe all possible side effects. Call your doctor for medical advice about side effects. You may report side effects to FDA at 1-800-FDA-1088. Where should I keep my medicine? This medicine is given in a hospital or clinic. It will not be stored at home. NOTE: This sheet is a summary. It may not cover all possible information. If you have questions about this medicine, talk to your doctor, pharmacist, or health care provider.  2021 Elsevier/Gold Standard (2020-12-01 13:22:53)  Daratumumab injection What is this  medicine? DARATUMUMAB (dar a toom ue mab) is a monoclonal antibody. It is used to treat multiple myeloma. This medicine may be used for other purposes; ask your health care provider or pharmacist if you have questions. COMMON BRAND NAME(S): DARZALEX What should I tell my health care provider before I take this medicine? They need to know if you have any of these conditions:  hereditary fructose intolerance  infection (especially a virus infection such as chickenpox, herpes, or hepatitis B virus)  lung or breathing disease (asthma, COPD)  an unusual or allergic reaction to daratumumab, sorbitol, other medicines, foods, dyes, or preservatives  pregnant or trying to get pregnant  breast-feeding How should I use this medicine? This medicine is for infusion into a vein. It is given by a health care professional in  a hospital or clinic setting. Talk to your pediatrician regarding the use of this medicine in children. Special care may be needed. Overdosage: If you think you have taken too much of this medicine contact a poison control center or emergency room at once. NOTE: This medicine is only for you. Do not share this medicine with others. What if I miss a dose? Keep appointments for follow-up doses as directed. It is important not to miss your dose. Call your doctor or health care professional if you are unable to keep an appointment. What may interact with this medicine? Interactions have not been studied. This list may not describe all possible interactions. Give your health care provider a list of all the medicines, herbs, non-prescription drugs, or dietary supplements you use. Also tell them if you smoke, drink alcohol, or use illegal drugs. Some items may interact with your medicine. What should I watch for while using this medicine? Your condition will be monitored carefully while you are receiving this medicine. This medicine can cause serious allergic reactions. To reduce your  risk, your health care provider may give you other medicine to take before receiving this one. Be sure to follow the directions from your health care provider. This medicine can affect the results of blood tests to match your blood type. These changes can last for up to 6 months after the final dose. Your healthcare provider will do blood tests to match your blood type before you start treatment. Tell all of your healthcare providers that you are being treated with this medicine before receiving a blood transfusion. This medicine can affect the results of some tests used to determine treatment response; extra tests may be needed to evaluate response. Do not become pregnant while taking this medicine or for 3 months after stopping it. Women should inform their health care provider if they wish to become pregnant or think they might be pregnant. There is a potential for serious side effects to an unborn child. Talk to your health care provider for more information. Do not breast-feed an infant while taking this medicine. What side effects may I notice from receiving this medicine? Side effects that you should report to your doctor or health care professional as soon as possible:  allergic reactions (skin rash; itching or hives; swelling of the face, lips, or tongue)  infection (fever, chills, cough, sore throat, pain or difficulty passing urine)  infusion reaction (dizziness, fast heartbeat, feeling faint or lightheaded, falls, headache, increase in blood pressure, nausea, vomiting, or wheezing or trouble breathing with loud or whistling sounds)  unusual bleeding or bruising Side effects that usually do not require medical attention (report to your doctor or health care professional if they continue or are bothersome):  constipation  diarrhea  pain, tingling, numbness in the hands or feet  swelling of the ankles, feet, hands  tiredness This list may not describe all possible side effects. Call  your doctor for medical advice about side effects. You may report side effects to FDA at 1-800-FDA-1088. Where should I keep my medicine? This drug is given in a hospital or clinic and will not be stored at home. NOTE: This sheet is a summary. It may not cover all possible information. If you have questions about this medicine, talk to your doctor, pharmacist, or health care provider.  2021 Elsevier/Gold Standard (2020-12-01 13:28:52)

## 2021-02-10 ENCOUNTER — Emergency Department (HOSPITAL_COMMUNITY): Payer: Medicare Other

## 2021-02-10 ENCOUNTER — Inpatient Hospital Stay (HOSPITAL_COMMUNITY): Payer: Medicare Other

## 2021-02-10 ENCOUNTER — Encounter (HOSPITAL_COMMUNITY): Payer: Self-pay | Admitting: *Deleted

## 2021-02-10 ENCOUNTER — Telehealth: Payer: Self-pay | Admitting: *Deleted

## 2021-02-10 ENCOUNTER — Other Ambulatory Visit: Payer: Self-pay

## 2021-02-10 ENCOUNTER — Inpatient Hospital Stay: Payer: Medicare Other

## 2021-02-10 ENCOUNTER — Ambulatory Visit: Payer: Medicare Other

## 2021-02-10 ENCOUNTER — Inpatient Hospital Stay (HOSPITAL_COMMUNITY)
Admission: EM | Admit: 2021-02-10 | Discharge: 2021-02-12 | DRG: 189 | Disposition: A | Discharge status: Home / Self Care | Payer: Medicare Other | Attending: Family Medicine | Admitting: Family Medicine

## 2021-02-10 DIAGNOSIS — Z888 Allergy status to other drugs, medicaments and biological substances status: Secondary | ICD-10-CM

## 2021-02-10 DIAGNOSIS — E8770 Fluid overload, unspecified: Secondary | ICD-10-CM | POA: Diagnosis present

## 2021-02-10 DIAGNOSIS — R339 Retention of urine, unspecified: Secondary | ICD-10-CM | POA: Diagnosis present

## 2021-02-10 DIAGNOSIS — R778 Other specified abnormalities of plasma proteins: Secondary | ICD-10-CM

## 2021-02-10 DIAGNOSIS — M5136 Other intervertebral disc degeneration, lumbar region: Secondary | ICD-10-CM | POA: Diagnosis present

## 2021-02-10 DIAGNOSIS — Z882 Allergy status to sulfonamides status: Secondary | ICD-10-CM

## 2021-02-10 DIAGNOSIS — Z86010 Personal history of colonic polyps: Secondary | ICD-10-CM

## 2021-02-10 DIAGNOSIS — E871 Hypo-osmolality and hyponatremia: Secondary | ICD-10-CM | POA: Diagnosis present

## 2021-02-10 DIAGNOSIS — E8581 Light chain (AL) amyloidosis: Secondary | ICD-10-CM | POA: Diagnosis present

## 2021-02-10 DIAGNOSIS — F419 Anxiety disorder, unspecified: Secondary | ICD-10-CM | POA: Diagnosis present

## 2021-02-10 DIAGNOSIS — Z20822 Contact with and (suspected) exposure to covid-19: Secondary | ICD-10-CM | POA: Diagnosis present

## 2021-02-10 DIAGNOSIS — R042 Hemoptysis: Secondary | ICD-10-CM | POA: Diagnosis present

## 2021-02-10 DIAGNOSIS — R8271 Bacteriuria: Secondary | ICD-10-CM | POA: Diagnosis present

## 2021-02-10 DIAGNOSIS — Z961 Presence of intraocular lens: Secondary | ICD-10-CM | POA: Diagnosis present

## 2021-02-10 DIAGNOSIS — M1990 Unspecified osteoarthritis, unspecified site: Secondary | ICD-10-CM | POA: Diagnosis present

## 2021-02-10 DIAGNOSIS — I5031 Acute diastolic (congestive) heart failure: Secondary | ICD-10-CM | POA: Diagnosis present

## 2021-02-10 DIAGNOSIS — R600 Localized edema: Secondary | ICD-10-CM | POA: Diagnosis present

## 2021-02-10 DIAGNOSIS — G8929 Other chronic pain: Secondary | ICD-10-CM | POA: Diagnosis present

## 2021-02-10 DIAGNOSIS — Z9049 Acquired absence of other specified parts of digestive tract: Secondary | ICD-10-CM

## 2021-02-10 DIAGNOSIS — M549 Dorsalgia, unspecified: Secondary | ICD-10-CM | POA: Diagnosis present

## 2021-02-10 DIAGNOSIS — K219 Gastro-esophageal reflux disease without esophagitis: Secondary | ICD-10-CM | POA: Diagnosis present

## 2021-02-10 DIAGNOSIS — J9601 Acute respiratory failure with hypoxia: Secondary | ICD-10-CM

## 2021-02-10 DIAGNOSIS — J9600 Acute respiratory failure, unspecified whether with hypoxia or hypercapnia: Secondary | ICD-10-CM | POA: Diagnosis present

## 2021-02-10 DIAGNOSIS — E785 Hyperlipidemia, unspecified: Secondary | ICD-10-CM | POA: Diagnosis present

## 2021-02-10 DIAGNOSIS — J810 Acute pulmonary edema: Secondary | ICD-10-CM

## 2021-02-10 DIAGNOSIS — Z79899 Other long term (current) drug therapy: Secondary | ICD-10-CM

## 2021-02-10 DIAGNOSIS — Z9841 Cataract extraction status, right eye: Secondary | ICD-10-CM

## 2021-02-10 DIAGNOSIS — G4700 Insomnia, unspecified: Secondary | ICD-10-CM | POA: Diagnosis present

## 2021-02-10 DIAGNOSIS — C9000 Multiple myeloma not having achieved remission: Secondary | ICD-10-CM

## 2021-02-10 DIAGNOSIS — Z8249 Family history of ischemic heart disease and other diseases of the circulatory system: Secondary | ICD-10-CM

## 2021-02-10 DIAGNOSIS — I110 Hypertensive heart disease with heart failure: Secondary | ICD-10-CM | POA: Diagnosis present

## 2021-02-10 DIAGNOSIS — Z90710 Acquired absence of both cervix and uterus: Secondary | ICD-10-CM

## 2021-02-10 DIAGNOSIS — Z87891 Personal history of nicotine dependence: Secondary | ICD-10-CM

## 2021-02-10 DIAGNOSIS — J9690 Respiratory failure, unspecified, unspecified whether with hypoxia or hypercapnia: Secondary | ICD-10-CM | POA: Diagnosis present

## 2021-02-10 DIAGNOSIS — Z885 Allergy status to narcotic agent status: Secondary | ICD-10-CM

## 2021-02-10 DIAGNOSIS — Z9842 Cataract extraction status, left eye: Secondary | ICD-10-CM

## 2021-02-10 DIAGNOSIS — I10 Essential (primary) hypertension: Secondary | ICD-10-CM | POA: Diagnosis present

## 2021-02-10 DIAGNOSIS — D72829 Elevated white blood cell count, unspecified: Secondary | ICD-10-CM

## 2021-02-10 DIAGNOSIS — I2510 Atherosclerotic heart disease of native coronary artery without angina pectoris: Secondary | ICD-10-CM | POA: Diagnosis present

## 2021-02-10 DIAGNOSIS — M810 Age-related osteoporosis without current pathological fracture: Secondary | ICD-10-CM | POA: Diagnosis present

## 2021-02-10 DIAGNOSIS — Z880 Allergy status to penicillin: Secondary | ICD-10-CM

## 2021-02-10 DIAGNOSIS — D472 Monoclonal gammopathy: Secondary | ICD-10-CM | POA: Diagnosis present

## 2021-02-10 DIAGNOSIS — Z886 Allergy status to analgesic agent status: Secondary | ICD-10-CM

## 2021-02-10 DIAGNOSIS — Z83438 Family history of other disorder of lipoprotein metabolism and other lipidemia: Secondary | ICD-10-CM

## 2021-02-10 LAB — CBC WITH DIFFERENTIAL/PLATELET
Abs Immature Granulocytes: 0.1 10*3/uL — ABNORMAL HIGH (ref 0.00–0.07)
Basophils Absolute: 0 10*3/uL (ref 0.0–0.1)
Basophils Relative: 0 %
Eosinophils Absolute: 0 10*3/uL (ref 0.0–0.5)
Eosinophils Relative: 0 %
HCT: 39.3 % (ref 36.0–46.0)
Hemoglobin: 13.6 g/dL (ref 12.0–15.0)
Immature Granulocytes: 1 %
Lymphocytes Relative: 4 %
Lymphs Abs: 0.6 10*3/uL — ABNORMAL LOW (ref 0.7–4.0)
MCH: 32.9 pg (ref 26.0–34.0)
MCHC: 34.6 g/dL (ref 30.0–36.0)
MCV: 94.9 fL (ref 80.0–100.0)
Monocytes Absolute: 0.9 10*3/uL (ref 0.1–1.0)
Monocytes Relative: 5 %
Neutro Abs: 16 10*3/uL — ABNORMAL HIGH (ref 1.7–7.7)
Neutrophils Relative %: 90 %
Platelets: 408 10*3/uL — ABNORMAL HIGH (ref 150–400)
RBC: 4.14 MIL/uL (ref 3.87–5.11)
RDW: 14.4 % (ref 11.5–15.5)
WBC: 17.7 10*3/uL — ABNORMAL HIGH (ref 4.0–10.5)
nRBC: 0 % (ref 0.0–0.2)

## 2021-02-10 LAB — COMPREHENSIVE METABOLIC PANEL
ALT: 16 U/L (ref 0–44)
AST: 27 U/L (ref 15–41)
Albumin: 3 g/dL — ABNORMAL LOW (ref 3.5–5.0)
Alkaline Phosphatase: 79 U/L (ref 38–126)
Anion gap: 13 (ref 5–15)
BUN: 20 mg/dL (ref 8–23)
CO2: 22 mmol/L (ref 22–32)
Calcium: 8.6 mg/dL — ABNORMAL LOW (ref 8.9–10.3)
Chloride: 89 mmol/L — ABNORMAL LOW (ref 98–111)
Creatinine, Ser: 0.78 mg/dL (ref 0.44–1.00)
GFR, Estimated: >60 mL/min (ref >60)
Glucose, Bld: 144 mg/dL — ABNORMAL HIGH (ref 70–99)
Potassium: 4.3 mmol/L (ref 3.5–5.1)
Sodium: 124 mmol/L — ABNORMAL LOW (ref 135–145)
Total Bilirubin: 0.7 mg/dL (ref 0.3–1.2)
Total Protein: 6.3 g/dL — ABNORMAL LOW (ref 6.5–8.1)

## 2021-02-10 LAB — URINALYSIS, ROUTINE W REFLEX MICROSCOPIC
Bilirubin Urine: NEGATIVE
Glucose, UA: NEGATIVE mg/dL
Ketones, ur: NEGATIVE mg/dL
Leukocytes,Ua: NEGATIVE
Nitrite: NEGATIVE
Protein, ur: >=300 mg/dL — AB
Specific Gravity, Urine: 1.024 (ref 1.005–1.030)
pH: 5 (ref 5.0–8.0)

## 2021-02-10 LAB — TROPONIN I (HIGH SENSITIVITY)
Troponin I (High Sensitivity): 72 ng/L — ABNORMAL HIGH (ref <18)
Troponin I (High Sensitivity): 95 ng/L — ABNORMAL HIGH (ref <18)
Troponin I (High Sensitivity): 117 ng/L (ref <18)

## 2021-02-10 LAB — LACTIC ACID, PLASMA: Lactic Acid, Venous: 1.8 mmol/L (ref 0.5–1.9)

## 2021-02-10 LAB — PROCALCITONIN: Procalcitonin: 0.13 ng/mL

## 2021-02-10 LAB — SODIUM, URINE, RANDOM: Sodium, Ur: 39 mmol/L

## 2021-02-10 LAB — CREATININE, URINE, RANDOM: Creatinine, Urine: 112.16 mg/dL

## 2021-02-10 LAB — OSMOLALITY, URINE: Osmolality, Ur: 538 mOsm/kg (ref 300–900)

## 2021-02-10 LAB — BRAIN NATRIURETIC PEPTIDE: B Natriuretic Peptide: 703.9 pg/mL — ABNORMAL HIGH (ref 0.0–100.0)

## 2021-02-10 LAB — KAPPA/LAMBDA LIGHT CHAINS
Kappa free light chain: 18 mg/L (ref 3.3–19.4)
Kappa, lambda light chain ratio: 0.04 — ABNORMAL LOW (ref 0.26–1.65)
Lambda free light chains: 445.5 mg/L — ABNORMAL HIGH (ref 5.7–26.3)

## 2021-02-10 LAB — LIPASE, BLOOD: Lipase: 36 U/L (ref 11–51)

## 2021-02-10 IMAGING — CT CT ANGIO CHEST
3 of 7 series · 18 of 36 positions shown · IV contrast (omnipaque)
Comparison: [DATE]

CLINICAL DATA: Chemotherapy.  Fluid retention.

EXAM:
CT ANGIOGRAPHY CHEST WITH CONTRAST
TECHNIQUE: Multidetector CT imaging of the chest was performed using the
standard protocol during bolus administration of intravenous
contrast. Multiplanar CT image reconstructions and MIPs were
obtained to evaluate the vascular anatomy.
CONTRAST:  100mL OMNIPAQUE IOHEXOL 350 MG/ML SOLN

[Series 5: thins · axial · 0.71mm/px · z∈[+1216,+1496]mm · 12 of 332 slices shown]
[im 26/332  lung]
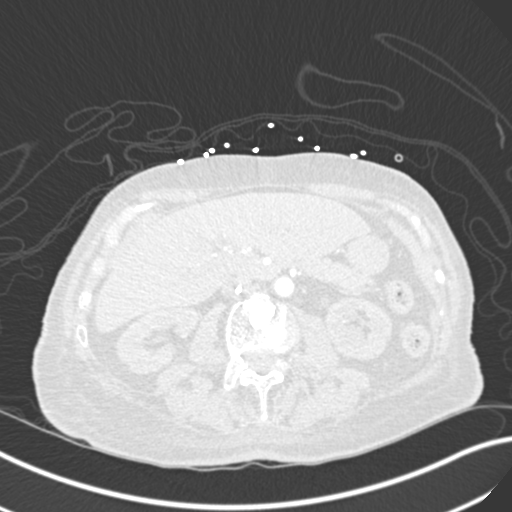
[im 51/332  mediastinal]
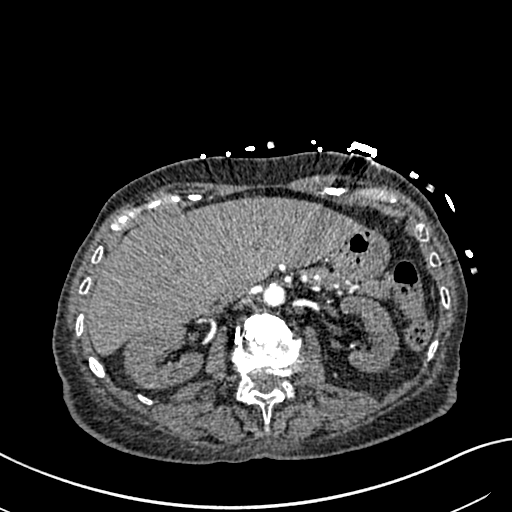
[im 77/332  lung]
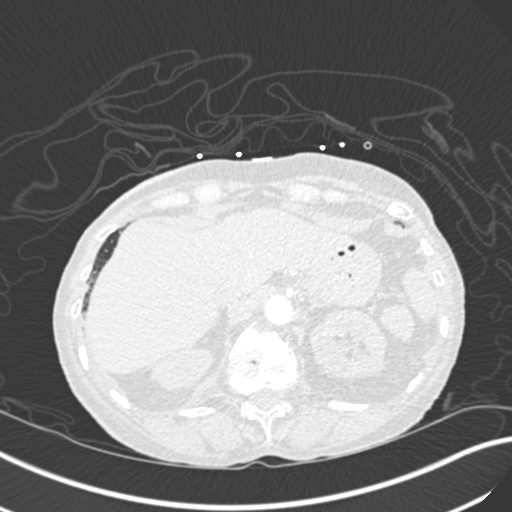
[im 102/332  mediastinal]
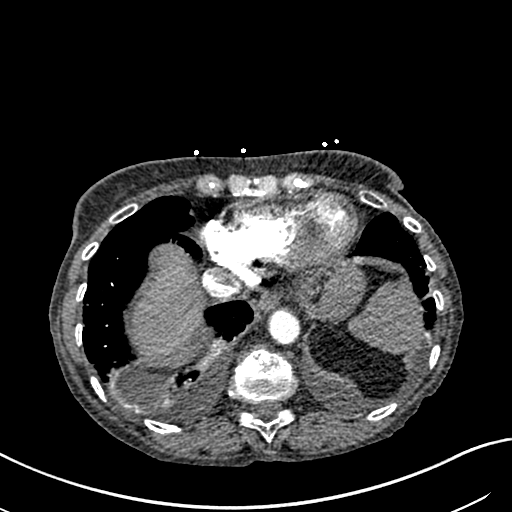
[im 128/332  lung]
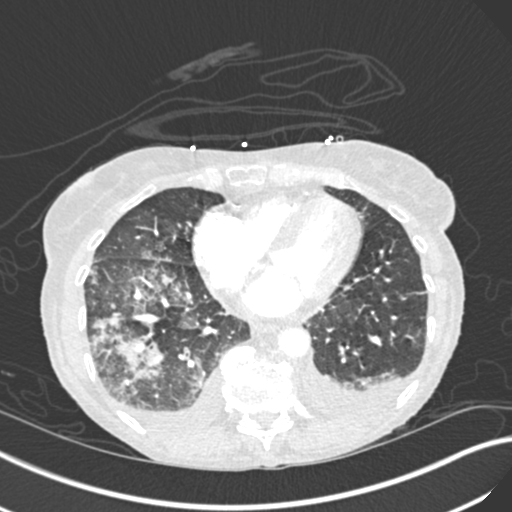
[im 153/332  mediastinal]
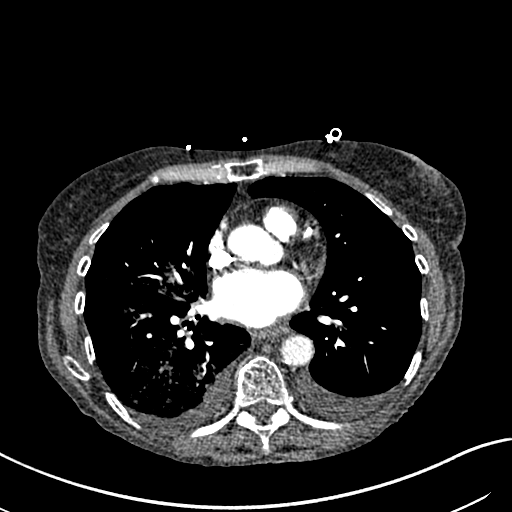
[im 179/332  lung]
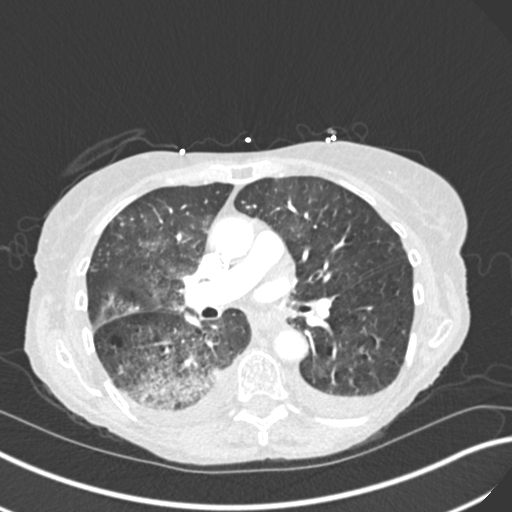
[im 204/332  mediastinal]
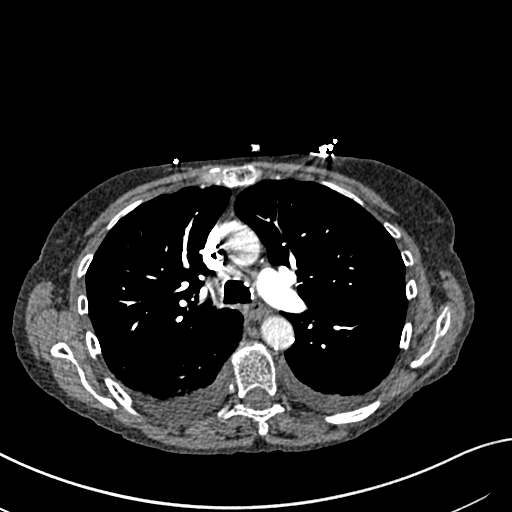
[im 230/332  lung]
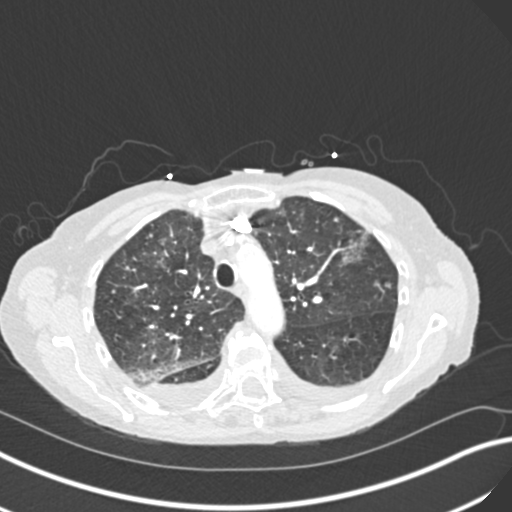
[im 255/332  mediastinal]
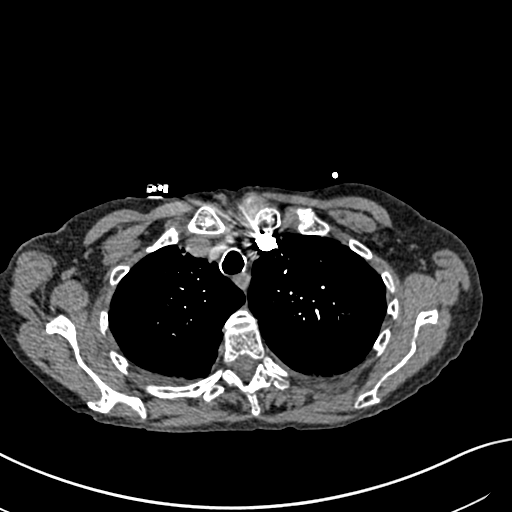
[im 281/332  lung]
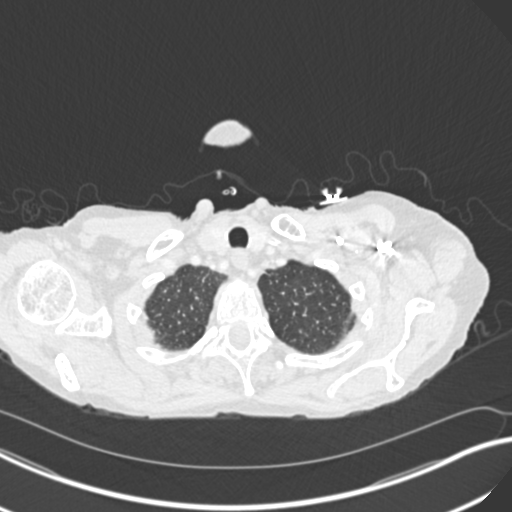
[im 306/332  mediastinal]
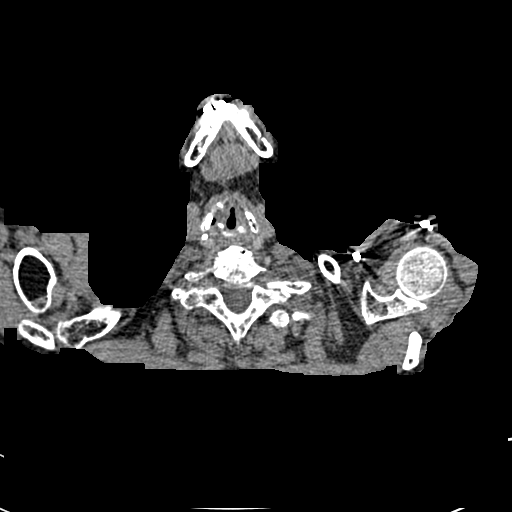

[Series 6: coronal mpr · coronal · 0.62mm/px · 1 of 130 slices shown]
[im 65/130  mediastinal]
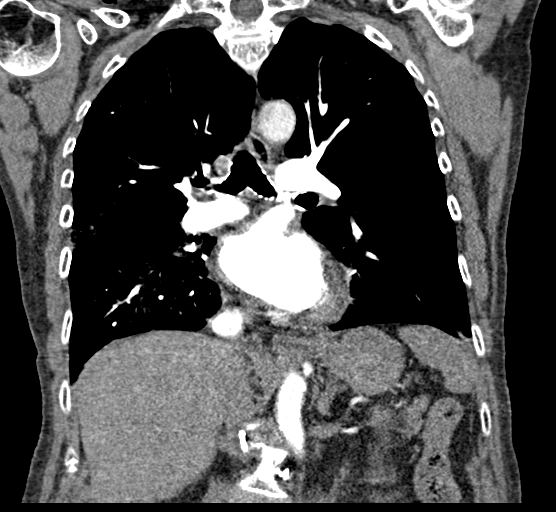

[Series 10: lung · axial · 0.71mm/px · z∈[+1258,+1468]mm · 5 of 159 slices shown]
[im 27/159  mediastinal]
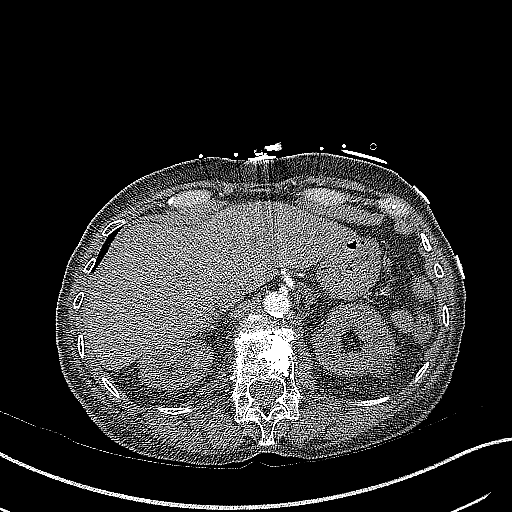
[im 53/159  mediastinal]
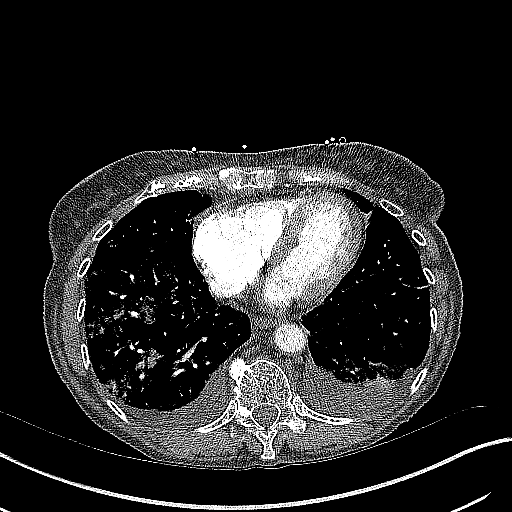
[im 80/159  mediastinal]
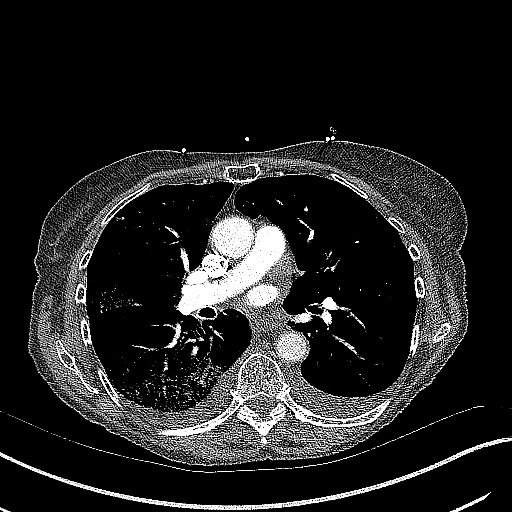
[im 106/159  mediastinal]
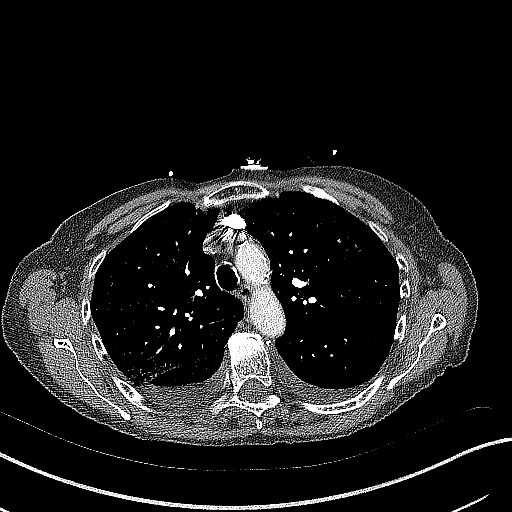
[im 132/159  mediastinal]
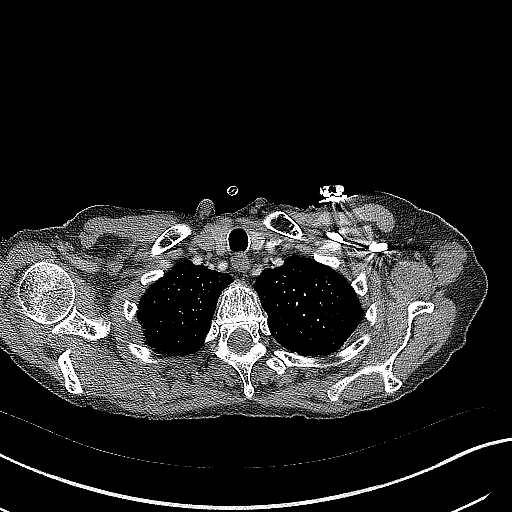

[18 of 36 positions shown; findings below may reference images not displayed]

FINDINGS: Cardiovascular: Contrast injection is sufficient to demonstrate
satisfactory opacification of the pulmonary arteries to the
segmental level. There is no pulmonary embolus or evidence of right
heart strain. The size of the main pulmonary artery is normal. Heart
size is normal, with no pericardial effusion. The course and caliber
of the aorta are normal. There is no atherosclerotic calcification.
Opacification decreased due to pulmonary arterial phase contrast
bolus timing.

Mediastinum/Nodes: No mediastinal, hilar or axillary
lymphadenopathy. Normal visualized thyroid. Thoracic esophageal
course is normal.

Lungs/Pleura: There are numerous small nodules throughout both
lungs, as previously demonstrated. There is asymmetric mixed density
opacity in the right lung, predominantly ground glass. Small pleural
effusions.

Upper Abdomen: Contrast bolus timing is not optimized for evaluation
of the abdominal organs. The visualized portions of the organs of
the upper abdomen are normal.

Musculoskeletal: Unchanged multilevel vertebral augmentation and
midthoracic vertebra plana.

Review of the MIP images confirms the above findings.
IMPRESSION: 1. No pulmonary embolus or acute aortic syndrome.
2. Asymmetric mixed density opacity in the right lung, predominantly
ground glass. This may indicate infection or asymmetric pulmonary
edema.
3. Numerous small nodules throughout both lungs, as previously
demonstrated.
4. Small pleural effusions.

## 2021-02-10 IMAGING — DX DG CHEST 1V PORT
1 series · 1 of 1 positions shown · non-contrast
Comparison: [DATE].

CLINICAL DATA: Shortness of breath.  Multiple myeloma

EXAM:
PORTABLE CHEST 1 VIEW

[chest ap]
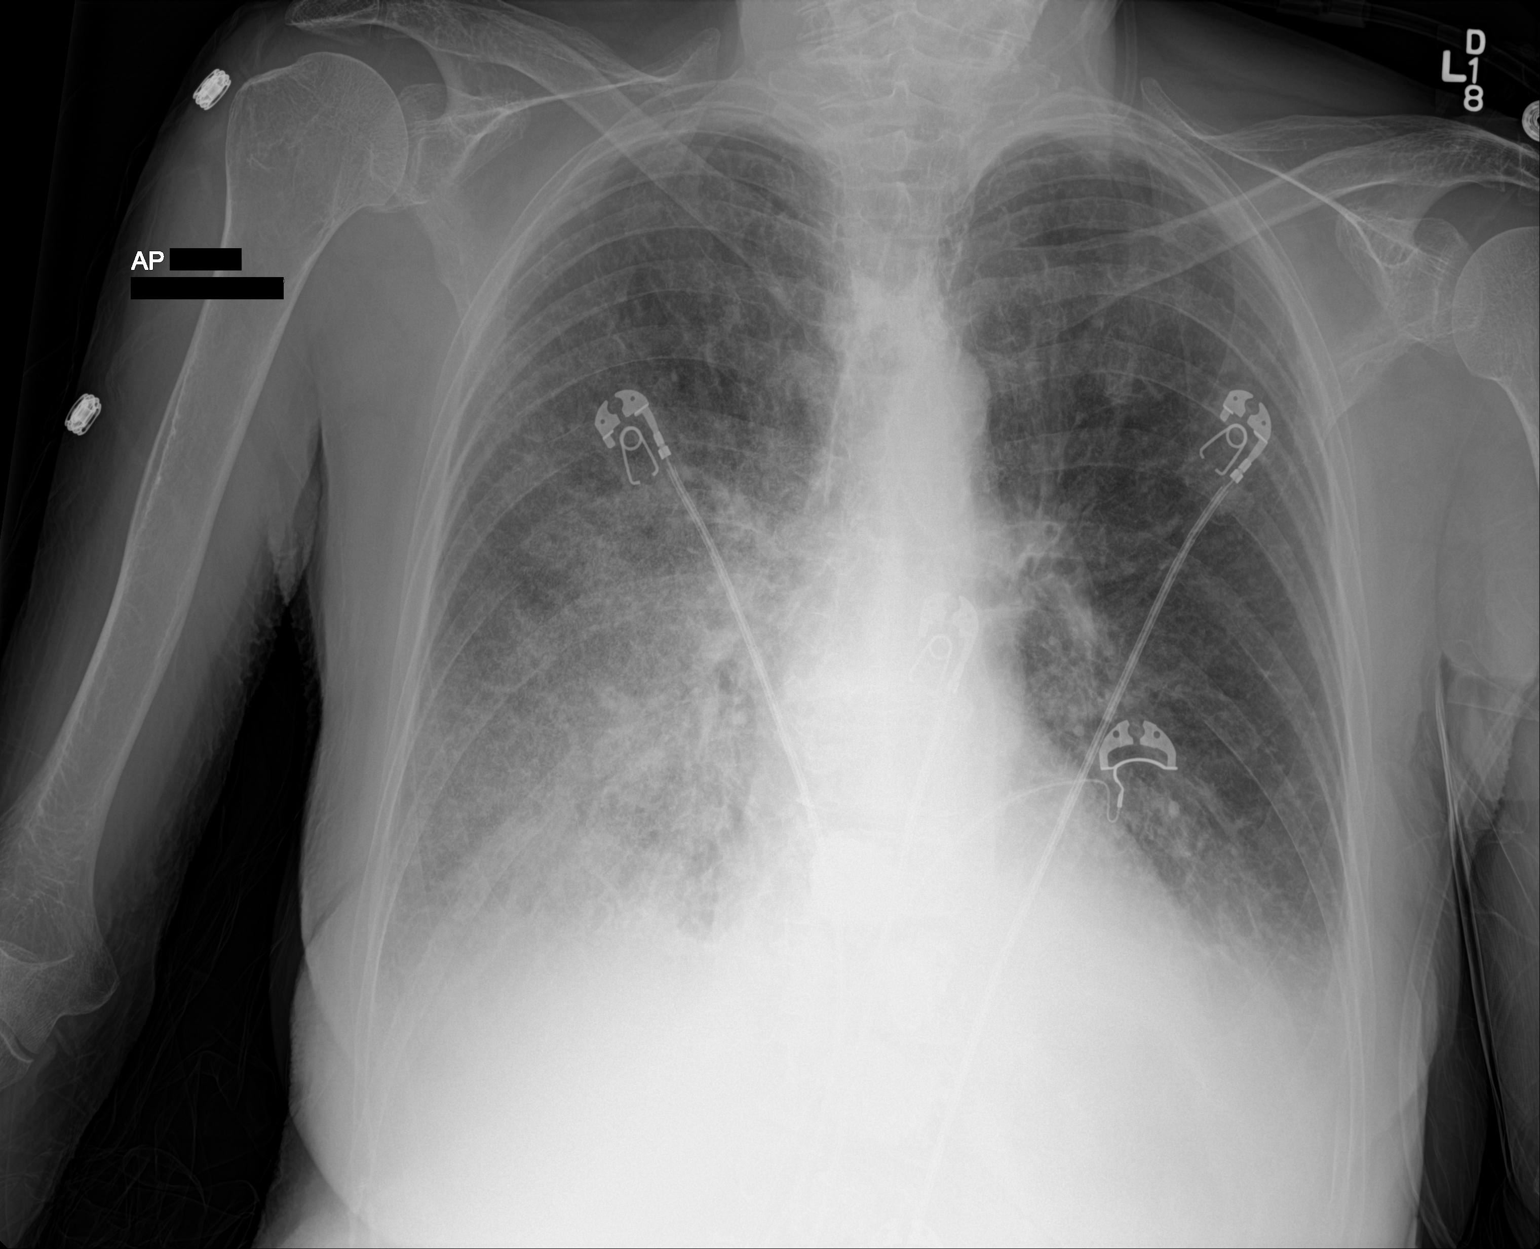

[1 of 1 positions shown; findings below may reference images not displayed]

FINDINGS: There is extensive airspace opacity throughout much of the right mid
and lower lung region. Small pleural effusions noted bilaterally.
There is atelectatic change in the left base. The heart size and
pulmonary vascularity are normal. No adenopathy. Bones are
osteoporotic.
IMPRESSION: Extensive airspace opacity throughout much of the right lung. Small
pleural effusions bilaterally with left base atelectasis. Appearance
is consistent with multifocal pneumonia on the right. An allergic
type response is a differential consideration, although allergic
type phenomenon is usually more symmetric than is seen in this case.

Heart size normal.  No adenopathy.  Bones osteoporotic.

## 2021-02-10 MED ORDER — SENNOSIDES-DOCUSATE SODIUM 8.6-50 MG PO TABS: 1.0000 | ORAL_TABLET | Freq: Every evening | ORAL | Status: DC | PRN

## 2021-02-10 MED ORDER — FUROSEMIDE 10 MG/ML IJ SOLN
20.0000 mg | Freq: Two times a day (BID) | INTRAMUSCULAR | Status: DC
  Administered 2021-02-11: 20 mg via INTRAVENOUS
  Filled 2021-02-10: qty 2

## 2021-02-10 MED ORDER — FLUTICASONE PROPIONATE 50 MCG/ACT NA SUSP: 1.0000 | NASAL | Status: DC | PRN

## 2021-02-10 MED ORDER — ONDANSETRON HCL 4 MG PO TABS: 4.0000 mg | ORAL_TABLET | Freq: Four times a day (QID) | ORAL | Status: DC | PRN

## 2021-02-10 MED ORDER — ACYCLOVIR 400 MG PO TABS
400.0000 mg | ORAL_TABLET | Freq: Two times a day (BID) | ORAL | Status: DC
  Administered 2021-02-10 – 2021-02-11 (×3): 400 mg via ORAL
  Filled 2021-02-10 (×4): qty 1

## 2021-02-10 MED ORDER — CALCIUM CARBONATE ANTACID 500 MG PO CHEW: 1.0000 | CHEWABLE_TABLET | Freq: Every day | ORAL | Status: DC | PRN

## 2021-02-10 MED ORDER — FUROSEMIDE 10 MG/ML IJ SOLN
40.0000 mg | Freq: Once | INTRAMUSCULAR | Status: AC
  Administered 2021-02-10: 40 mg via INTRAVENOUS
  Filled 2021-02-10: qty 4

## 2021-02-10 MED ORDER — ORAL CARE MOUTH RINSE
15.0000 mL | Freq: Two times a day (BID) | OROMUCOSAL | Status: DC
  Administered 2021-02-10 – 2021-02-11 (×3): 15 mL via OROMUCOSAL

## 2021-02-10 MED ORDER — ACETAMINOPHEN 325 MG PO TABS
650.0000 mg | ORAL_TABLET | Freq: Four times a day (QID) | ORAL | Status: DC | PRN
  Administered 2021-02-11: 650 mg via ORAL
  Filled 2021-02-10: qty 2

## 2021-02-10 MED ORDER — METHYLPREDNISOLONE SODIUM SUCC 125 MG IJ SOLR: 125.0000 mg | Freq: Once | INTRAMUSCULAR | Status: DC

## 2021-02-10 MED ORDER — ROSUVASTATIN CALCIUM 10 MG PO TABS
10.0000 mg | ORAL_TABLET | Freq: Every day | ORAL | Status: DC
  Administered 2021-02-11: 10 mg via ORAL
  Filled 2021-02-10: qty 1

## 2021-02-10 MED ORDER — ENOXAPARIN SODIUM 40 MG/0.4ML ~~LOC~~ SOLN: 40.0000 mg | SUBCUTANEOUS | Status: DC

## 2021-02-10 MED ORDER — ONDANSETRON HCL 4 MG/2ML IJ SOLN: 4.0000 mg | Freq: Four times a day (QID) | INTRAMUSCULAR | Status: DC | PRN

## 2021-02-10 MED ORDER — IOHEXOL 350 MG/ML SOLN
100.0000 mL | Freq: Once | INTRAVENOUS | Status: AC | PRN
  Administered 2021-02-10: 100 mL via INTRAVENOUS

## 2021-02-10 MED ORDER — PANTOPRAZOLE SODIUM 40 MG PO TBEC
40.0000 mg | DELAYED_RELEASE_TABLET | Freq: Every day | ORAL | Status: DC
  Administered 2021-02-11: 40 mg via ORAL
  Filled 2021-02-10: qty 1

## 2021-02-10 MED ORDER — ACETAMINOPHEN 650 MG RE SUPP: 650.0000 mg | Freq: Four times a day (QID) | RECTAL | Status: DC | PRN

## 2021-02-10 MED ORDER — ALBUTEROL SULFATE (2.5 MG/3ML) 0.083% IN NEBU: 2.5000 mg | INHALATION_SOLUTION | RESPIRATORY_TRACT | Status: DC | PRN

## 2021-02-10 NOTE — Progress Notes (Signed)
ONcology short notes  Case discussed with Dr. Maryan Rued.  Patient presenting with progressive shortness of breath that developed after receiving cycle 1 day 1 of treatment for newly diagnosed myeloma with daratumumab/bortezomib/Cytoxan/dexamethasone 02/09/2021.  Patient's primary oncologist is Dr. Lorenso Courier. Per report the patient does have abdominal wall and lower extremity edema and appears to be somewhat hypervolemic. Patient is hypoxic and should be admitted to the hospital probably in stepdown unit. REC -Admit to hospitalist service. -Septic work-up -viral respiratory panel including r/o influenza/covid  -Rule out acute coronary syndrome. -ECHO -CT chest without contrast -Pulmonary consultation to evaluate for atypical infection, interstitial pneumonitis. -Due to Velcade and Cytoxan can potentially cause hypersensitivity/interstitial pneumonitis/ARDS these are not very common especially after just 1 dose of treatment and in this time frame. -oncology will f/u tomorrow.  Sullivan Lone MD MS

## 2021-02-10 NOTE — Plan of Care (Signed)
Discussed briefly with Dr. Antonieta Pert MD Briefly 75 yo F with HTN, HLD seen by Dr. Acie Fredrickson in 04/2020, MM, chronic hyponatremia recently starting daratumumab/bortezomib/Cytoxan/dexamethasone who has presented with inability to urinate, hemoptysis and shortness of breath. Has also noted chest pressure.  Novel troponin 72-95; BNP 703; procalcitonin 0.13; Lactate 1.8.  Na 124 from 127 02/09/21 ECG Notable for sinus tachycardia rate 104 -agree with the importance of broad testing:  Team has ordered CTPE, Echo, and given 40 IV lasix. - would carefully follow sodium levels in the setting of diuresis - discussed that the cytoxan and bortzeomib can cause cardiomyopathy, the < 24 time interval is not classical the presentation  Discussed care with primary MD; will place on our rounding list but we are available if urgent or emergent needs arise  Werner Lean, MD

## 2021-02-10 NOTE — ED Notes (Signed)
Attempted to call report. No answer.

## 2021-02-10 NOTE — Progress Notes (Signed)
eLink Physician-Brief Progress Note Patient Name: Wendy [name] DOB: 1945/12/26 MRN: [id number]   Date of Service  02/10/2021  HPI/Events of Note  Patient admitted with respiratory insufficiency, lung infiltrates suspicious for  Pneumonia, elevated troponin without EKG changes consistent with myocardial infarction, all occurring in the context of onset of chemotherapy for multiple myeloma.  eICU Interventions  New Patient Evaluation completed.        Kerry Kass [name] 02/10/2021, 10:17 PM

## 2021-02-10 NOTE — ED Notes (Signed)
Pt states she still feels like bladder is full post foley insertion, foley is draining (60ml), asked provider to come see- MD scanned pt bladder again, minimal fluid noted- bladder not full. MD aware and putting in different orders. Will continue to monitor

## 2021-02-10 NOTE — Telephone Encounter (Signed)
Received call from patient . She states that she received her 1st chemo therapy yesterday and did well. However, she has not been able to pass any urine since she got home.  She has been drinking some fluids. She is not uncomfortable at this point. Advised to try to drink more fluids. If no urine by 12 noon, pt instructed to call back.   12:30 pm Pt called back and is still unable to pass any urine-just a few drops.  She is feeling very uncomfortable at this time.  Advised to go to the [facility] ED now. She likely needs to be catheterized and we cannot do that here in this clinic. She voiced understanding. She will leave as soon as she hangs up with this RN. Advised that I would call ahead to the ED and let them know she is on her her way.  TCT Stacy, charge nurse in [place] and advised that pt was on her way.

## 2021-02-10 NOTE — ED Triage Notes (Signed)
Pt states she has not been able to void since yesterday.

## 2021-02-10 NOTE — Telephone Encounter (Signed)
-----   Message from Georgianne Fick, RN sent at 02/09/2021  2:46 PM EST ----- Regarding: Dr. Lorenso Courier First Chemo Patient received first time Cyclophosphamide (CYTOXAN), Bortezomib (VELCADE) & Daratumumab-hyaluronidase-fihj (DARZALEX FASPRO) and tolerated these well.

## 2021-02-10 NOTE — ED Notes (Signed)
Critical Troponin 117 Provider notified: [facility] ED floor coverage) Awaiting orders Received 02/10/2021 20:50

## 2021-02-10 NOTE — Plan of Care (Signed)
  Problem: Clinical Measurements: Goal: Respiratory complications will improve Outcome: Progressing   Problem: Elimination: Goal: Will not experience complications related to urinary retention Outcome: Progressing   Problem: Safety: Goal: Ability to remain free from injury will improve Outcome: Progressing   Problem: Skin Integrity: Goal: Risk for impaired skin integrity will decrease Outcome: Progressing   
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
## 2021-02-10 NOTE — ED Notes (Signed)
Patient transported to CT 

## 2021-02-10 NOTE — ED Notes (Signed)
Put pt on 3L [place] d/t sob and O2 at 88%. O2 now > 92%

## 2021-02-10 NOTE — Telephone Encounter (Signed)
Left message on pt's vm to call back to let us know how she did with her treatment.

## 2021-02-10 NOTE — ED Notes (Signed)
MD used [place] to scan pt bladder, states it appears full and to insert foley

## 2021-02-10 NOTE — ED Notes (Signed)
Per [facility]-patient coming from home-had chemo yesterday and now unable to urinate/empty bladder-sending to ED for further eval

## 2021-02-10 NOTE — Telephone Encounter (Signed)
Pt returned call & left vm that she was having trouble peeing & had already talked with [facility] RN/[place] & she recommended some gatorade.  She also had trouble sleeping last hs.
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
## 2021-02-11 ENCOUNTER — Inpatient Hospital Stay (HOSPITAL_COMMUNITY): Payer: Medicare Other

## 2021-02-11 DIAGNOSIS — J9601 Acute respiratory failure with hypoxia: Principal | ICD-10-CM

## 2021-02-11 DIAGNOSIS — I5031 Acute diastolic (congestive) heart failure: Secondary | ICD-10-CM

## 2021-02-11 LAB — COMPREHENSIVE METABOLIC PANEL
ALT: 13 U/L (ref 0–44)
AST: 19 U/L (ref 15–41)
Albumin: 2.4 g/dL — ABNORMAL LOW (ref 3.5–5.0)
Alkaline Phosphatase: 61 U/L (ref 38–126)
Anion gap: 10 (ref 5–15)
BUN: 19 mg/dL (ref 8–23)
CO2: 24 mmol/L (ref 22–32)
Calcium: 8.1 mg/dL — ABNORMAL LOW (ref 8.9–10.3)
Chloride: 93 mmol/L — ABNORMAL LOW (ref 98–111)
Creatinine, Ser: 0.72 mg/dL (ref 0.44–1.00)
GFR, Estimated: >60 mL/min (ref >60)
Glucose, Bld: 98 mg/dL (ref 70–99)
Potassium: 3.8 mmol/L (ref 3.5–5.1)
Sodium: 127 mmol/L — ABNORMAL LOW (ref 135–145)
Total Bilirubin: 0.6 mg/dL (ref 0.3–1.2)
Total Protein: 4.8 g/dL — ABNORMAL LOW (ref 6.5–8.1)

## 2021-02-11 LAB — BRAIN NATRIURETIC PEPTIDE: B Natriuretic Peptide: 991.9 pg/mL — ABNORMAL HIGH (ref 0.0–100.0)

## 2021-02-11 LAB — CBC
HCT: 31.3 % — ABNORMAL LOW (ref 36.0–46.0)
Hemoglobin: 10.8 g/dL — ABNORMAL LOW (ref 12.0–15.0)
MCH: 32.8 pg (ref 26.0–34.0)
MCHC: 34.5 g/dL (ref 30.0–36.0)
MCV: 95.1 fL (ref 80.0–100.0)
Platelets: 273 10*3/uL (ref 150–400)
RBC: 3.29 MIL/uL — ABNORMAL LOW (ref 3.87–5.11)
RDW: 14.7 % (ref 11.5–15.5)
WBC: 10.5 10*3/uL (ref 4.0–10.5)
nRBC: 0 % (ref 0.0–0.2)

## 2021-02-11 LAB — ECHOCARDIOGRAM COMPLETE
Area-P 1/2: 3.08 cm2
Height: 62.5 in
S' Lateral: 2.91 cm
Weight: 2119.94 oz

## 2021-02-11 LAB — PROCALCITONIN: Procalcitonin: 0.15 ng/mL

## 2021-02-11 LAB — MRSA PCR SCREENING: MRSA by PCR: NEGATIVE

## 2021-02-11 LAB — STREP PNEUMONIAE URINARY ANTIGEN: Strep Pneumo Urinary Antigen: NEGATIVE

## 2021-02-11 LAB — SARS CORONAVIRUS 2 (TAT 6-24 HRS): SARS Coronavirus 2: NEGATIVE

## 2021-02-11 IMAGING — DX DG CHEST 1V PORT
1 series · 1 of 1 positions shown · non-contrast
Comparison: Yesterday

CLINICAL DATA: Respiratory failure

EXAM:
PORTABLE CHEST 1 VIEW

[chest ap]
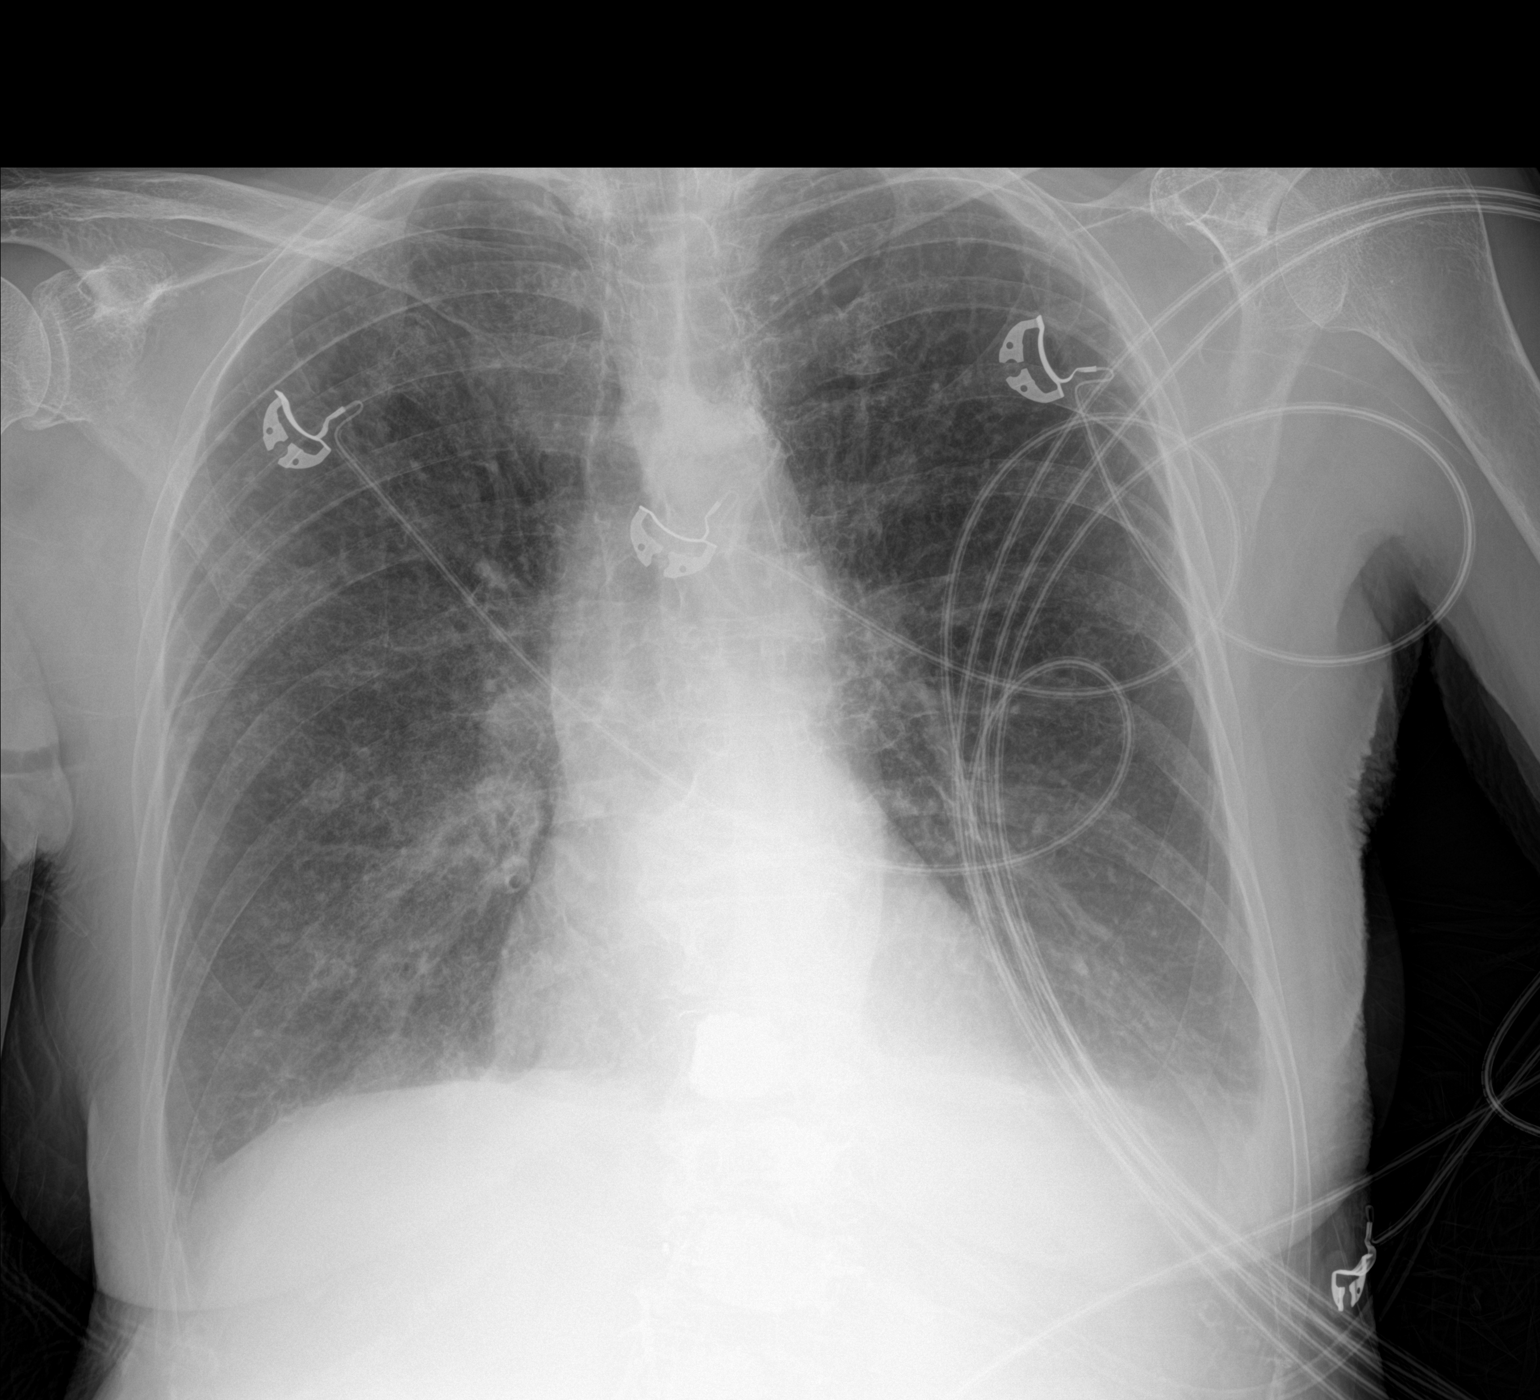

[1 of 1 positions shown; findings below may reference images not displayed]

FINDINGS: Improved aeration. Small pleural effusions and generalized coarsened
lung markings with numerous small nodules by CT. Normal heart size.
Negative for air leak.
IMPRESSION: 1. Rapidly improved aeration compatible with improved edema.
2. Background chronic disease.  Small pleural effusions.

## 2021-02-11 MED ORDER — ZOLPIDEM TARTRATE 5 MG PO TABS
5.0000 mg | ORAL_TABLET | Freq: Every evening | ORAL | Status: DC | PRN
  Administered 2021-02-11: 5 mg via ORAL
  Filled 2021-02-11: qty 1

## 2021-02-11 MED ORDER — ALPRAZOLAM 0.25 MG PO TABS
0.2500 mg | ORAL_TABLET | Freq: Two times a day (BID) | ORAL | Status: DC | PRN
  Administered 2021-02-11: 0.25 mg via ORAL
  Filled 2021-02-11: qty 1

## 2021-02-11 MED ORDER — ALBUTEROL SULFATE HFA 108 (90 BASE) MCG/ACT IN AERS: 2.0000 | INHALATION_SPRAY | Freq: Four times a day (QID) | RESPIRATORY_TRACT | Status: DC | PRN

## 2021-02-11 MED ORDER — FUROSEMIDE 10 MG/ML IJ SOLN
20.0000 mg | Freq: Two times a day (BID) | INTRAMUSCULAR | Status: DC
  Administered 2021-02-11 – 2021-02-12 (×2): 20 mg via INTRAVENOUS
  Filled 2021-02-11 (×2): qty 2

## 2021-02-11 MED ORDER — ENOXAPARIN SODIUM 40 MG/0.4ML ~~LOC~~ SOLN
40.0000 mg | Freq: Every day | SUBCUTANEOUS | Status: DC
  Administered 2021-02-11: 40 mg via SUBCUTANEOUS
  Filled 2021-02-11: qty 0.4

## 2021-02-11 MED ORDER — MELATONIN 3 MG PO TABS
3.0000 mg | ORAL_TABLET | Freq: Every day | ORAL | Status: DC
  Administered 2021-02-11: 3 mg via ORAL
  Filled 2021-02-11: qty 1

## 2021-02-11 MED ORDER — ALCLOMETASONE DIPROPIONATE 0.05 % EX OINT: 1.0000 "application " | TOPICAL_OINTMENT | Freq: Every day | CUTANEOUS | Status: DC | PRN

## 2021-02-11 MED ORDER — VITAMIN D 25 MCG (1000 UNIT) PO TABS
2000.0000 [IU] | ORAL_TABLET | Freq: Every day | ORAL | Status: DC
  Administered 2021-02-11: 2000 [IU] via ORAL
  Filled 2021-02-11: qty 2

## 2021-02-11 MED ORDER — FUROSEMIDE 10 MG/ML IJ SOLN: 40.0000 mg | Freq: Two times a day (BID) | INTRAMUSCULAR | Status: DC

## 2021-02-11 MED ORDER — CHLORHEXIDINE GLUCONATE CLOTH 2 % EX PADS
6.0000 | MEDICATED_PAD | Freq: Every day | CUTANEOUS | Status: DC
  Administered 2021-02-11: 6 via TOPICAL

## 2021-02-11 NOTE — Progress Notes (Signed)
  Echocardiogram 2D Echocardiogram has been performed.  Wendy [name] 02/11/2021, 9:36 AM
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
## 2021-02-11 NOTE — Progress Notes (Signed)
PHARMACY CONSULT: Lovenox for VTE prophylaxis   Wt: 60.1 kg BMI:  23 Scr:  0.72 CrCl >30 ml/hr  H/H: 10.8/31.3 Pltc: 273  A/P:  Appropriate for standard dose Lovenox 40mg  SQ q24h No further dose adjustments needed, Pharmacy will follow remotely   Peggyann Juba, PharmD, [facility]: [phone] 02/11/2021 9:51 AM

## 2021-02-11 NOTE — Progress Notes (Addendum)
Per pt request, patient's home meds taken home by Darnell Level, patient's daughter.

## 2021-02-11 NOTE — Progress Notes (Signed)
PROGRESS NOTE    Wendy [name]  WUJ:[id number] DOB: [birth year] DOA: 02/10/2021 PCP: Abner Greenspan, MD   Brief Narrative:  HPI: Wendy [name] is a 75 y.o. 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  She reports she did well right after chemo but then had difficulty urinating and was not able to take deep breath was having bilateral chest heaviness, she tried albuterol inhaler that she was prescribed and noticed some hemoptysis today, see fail her abdomen was more tight and uncomfortable.  She denies any fever, nausea, vomiting, abdomen pain, focal weakness.  No known history of CAD CHF.  ED Course: She has been tachycardic in low 100, was hypoxic as low as 85% placed on 4 L nasal cannula saturating 95%, T-max 97.5 blood work showed worsening hyponatremia at 124, renal function BUN is stable, BNP elevated 703, troponin elevated 72, lactic acid 1.8, leukocytosis 17.7 UA was hazy with WBC 21-50 but no urinary symptoms-Foley catheter was placed and had no significant urine retention. Patient was given IV Lasix.  Oncology was consulted who advised admission and further work-up with echo CT scan pulmonary consultation.  Given patient's hemoptysis, significant hypoxia elevated troponin and chest heaviness CT was also ordered (Discussed with patient risk ( including renal toxicity, but has stable renal fun as well as after discussing benefits, she wants to proceed- EDp has ordered CTA), her repeat troponin is pending-EDP is following.  Assessment & Plan:   Principal Problem:   Respiratory failure, acute ([place]) Active Problems:   Essential hypertension   Hyponatremia   Degenerative  disc disease, lumbar   Chronic back pain   Hyperlipidemia   Monoclonal gammopathy   Multiple myeloma (HCC)   Leucocytosis   Elevated troponin I level   Acute hypoxic respiratory failure/acute pulmonary edema/possible acute pneumonitis on the right lung/hemoptysis: Elevated proBNP.  Patient feels much better than yesterday.  Currently on room air and saturating around 92%.  Still has bibasilar fine crackles.  Wondering if one of the chemotherapeutic medications can cause acute pulmonary edema.  Will defer to cardiology and oncology.  Will provide her with Lasix 20 mg IV twice daily for at least 2 more doses.  Has had good urine output since admission.  Echo done, results pending.  Will follow.  PE ruled out.  Elevated troponin: Troponins are trending up but not significantly high enough.  Likely demand ischemia.  EKG with no acute ST-T wave changes.  Echo results pending.  Cardiology consulted yesterday, pending evaluation.  Recently diagnosed multiple myeloma just started on chemotherapy daratumumab/bortezomib/Cytoxan/dexamethasone 02/09/21: Oncology is consulted on board.   Leukocytosis: Resolved and was likely reactive.  No signs of infection.  Asymptomatic bacteriuria: No antibiotic indicated.  Chronic hyponatremia: Labs not indicative of SIADH.  Sodium is at baseline.  Monitor daily.  Hypertension on losartan, hold for now since blood pressure is controlled.  GERD cont on PPI  Hyperlipidemia cont Crestor  Anxiety on Xanax  DJD/back pain on tramadol at home  DVT prophylaxis: enoxaparin (LOVENOX) injection 40 mg Start: 02/11/21 1030   Code Status: Full Code  Family Communication: None present at bedside.  Plan of care discussed with patient in length and he verbalized understanding and agreed with it.  Status is: Inpatient  Remains inpatient  appropriate because:Inpatient level of care appropriate due to severity of illness   Dispo: The patient is from: Home               Anticipated d/c is to: Home              Anticipated d/c date is: 1 day              Patient currently is not medically stable to d/c.   Difficult to place patient No        Estimated body mass index is 23.85 kg/m as calculated from the following:   Height as of this encounter: 5' 2.5" (1.588 m).   Weight as of this encounter: 60.1 kg.      Nutritional status:               Consultants:   Oncology  Cardiology  Procedures:   None  Antimicrobials:  Anti-infectives (From admission, onward)   Start     Dose/Rate Route Frequency Ordered Stop   02/10/21 2200  acyclovir (ZOVIRAX) tablet 400 mg        400 mg Oral 2 times daily 02/10/21 1830           Subjective: Patient seen and examined.  She states that she feels much much better.  No shortness of breath or any other complaint.  She is in good mood and very motivated.  Objective: Vitals:   02/11/21 0838 02/11/21 0857 02/11/21 0900 02/11/21 1000  BP:   (!) 129/58 (!) 103/50  Pulse:  (!) 107 (!) 108 (!) 104  Resp:  (!) 21 (!) 22 19  Temp: 98.6 F (37 C)     TempSrc: Oral     SpO2:  96% 96% 95%  Weight:      Height:        Intake/Output Summary (Last 24 hours) at 02/11/2021 1057 Last data filed at 02/11/2021 0900 Gross per 24 hour  Intake 100 ml  Output 2425 ml  Net -2325 ml   Filed Weights   02/10/21 1356 02/10/21 2100 02/11/21 0500  Weight: 59 kg 59.4 kg 60.1 kg    Examination:  General exam: Appears calm and comfortable  Respiratory system: Faint bibasilar crackles. Respiratory effort normal. Cardiovascular system: S1 & S2 heard, RRR. No JVD, murmurs, rubs, gallops or clicks.  +2 edema left lower extremity, +1 edema right lower extremity Gastrointestinal system: Abdomen is nondistended, soft and nontender. No organomegaly or masses felt. Normal bowel sounds heard. Central nervous system: Alert and oriented. No focal neurological deficits. Extremities: Symmetric 5 x 5 power. Skin: No  rashes, lesions or ulcers Psychiatry: Judgement and insight appear normal. Mood & affect appropriate.    Data Reviewed: I have personally reviewed following labs and imaging studies  CBC: Recent Labs  Lab 02/09/21 0730 02/10/21 1515 02/11/21 0236  WBC 6.6 17.7* 10.5  NEUTROABS 4.7 16.0*  --   HGB 11.8* 13.6 10.8*  HCT 32.6* 39.3 31.3*  MCV 91.3 94.9 95.1  PLT 422* 408* 009   Basic Metabolic Panel: Recent Labs  Lab 02/09/21 0730 02/10/21 1515 02/11/21 0236  NA 127* 124* 127*  K 4.0 4.3 3.8  CL 97* 89* 93*  CO2 '23 22 24  ' GLUCOSE 118* 144* 98  BUN '9 20 19  ' CREATININE 0.78 0.78 0.72  CALCIUM 8.5* 8.6* 8.1*   GFR: Estimated Creatinine Clearance: 48.5 mL/min (by C-G formula based on SCr of 0.72 mg/dL). Liver Function Tests: Recent Labs  Lab 02/09/21 0730  02/10/21 1515 02/11/21 0236  AST '21 27 19  ' ALT '12 16 13  ' ALKPHOS 85 79 61  BILITOT 0.4 0.7 0.6  PROT 5.7* 6.3* 4.8*  ALBUMIN 2.5* 3.0* 2.4*   Recent Labs  Lab 02/10/21 1515  LIPASE 36   No results for input(s): AMMONIA in the last 168 hours. Coagulation Profile: No results for input(s): INR, PROTIME in the last 168 hours. Cardiac Enzymes: No results for input(s): CKTOTAL, CKMB, CKMBINDEX, TROPONINI in the last 168 hours. BNP (last 3 results) No results for input(s): PROBNP in the last 8760 hours. HbA1C: No results for input(s): HGBA1C in the last 72 hours. CBG: No results for input(s): GLUCAP in the last 168 hours. Lipid Profile: No results for input(s): CHOL, HDL, LDLCALC, TRIG, CHOLHDL, LDLDIRECT in the last 72 hours. Thyroid Function Tests: No results for input(s): TSH, T4TOTAL, FREET4, T3FREE, THYROIDAB in the last 72 hours. Anemia Panel: No results for input(s): VITAMINB12, FOLATE, FERRITIN, TIBC, IRON, RETICCTPCT in the last 72 hours. Sepsis Labs: Recent Labs  Lab 02/10/21 1515 02/10/21 1640 02/11/21 0236  PROCALCITON 0.13  --  0.15  LATICACIDVEN  --  1.8  --     Recent Results (from  the past 240 hour(s))  SARS CORONAVIRUS 2 (TAT 6-24 HRS) Nasopharyngeal Nasopharyngeal Swab     Status: None   Collection Time: 02/10/21  4:21 PM   Specimen: Nasopharyngeal Swab  Result Value Ref Range Status   SARS Coronavirus 2 NEGATIVE NEGATIVE Final    Comment: (NOTE) SARS-CoV-2 target nucleic acids are NOT DETECTED.  The SARS-CoV-2 RNA is generally detectable in upper and lower respiratory specimens during the acute phase of infection. Negative results do not preclude SARS-CoV-2 infection, do not rule out co-infections with other pathogens, and should not be used as the sole basis for treatment or other patient management decisions. Negative results must be combined with clinical observations, patient history, and epidemiological information. The expected result is Negative.  Fact Sheet for Patients: SugarRoll.be  Fact Sheet for Healthcare Providers: https://www.woods-mathews.com/  This test is not yet approved or cleared by the [place] FDA and  has been authorized for detection and/or diagnosis of SARS-CoV-2 by FDA under an Emergency Use Authorization (EUA). This EUA will remain  in effect (meaning this test can be used) for the duration of the COVID-19 declaration under Se ction 564(b)(1) of the Act, 21 U.S.C. section 360bbb-3(b)(1), unless the authorization is terminated or revoked sooner.  Performed at [facility], [place] [address], [place], [place] [address]   Culture, blood (Routine X 2) w Reflex to ID Panel     Status: None (Preliminary result)   Collection Time: 02/10/21  4:40 PM   Specimen: BLOOD  Result Value Ref Range Status   Specimen Description   Final    BLOOD RIGHT ANTECUBITAL Performed at [facility] [address], [place], [place] [address]    Special Requests   Final    BOTTLES DRAWN AEROBIC ONLY Blood Culture adequate volume Performed at [facility] [address], [place], [place] [address]    Culture   Final    NO GROWTH < 12 HOURS Performed at [facility] [address] [place], [place] [address]    Report Status PENDING  Incomplete  Culture, blood (Routine X 2) w Reflex to ID Panel     Status: None (Preliminary result)   Collection Time: 02/10/21  4:40 PM   Specimen: BLOOD  Result Value  Ref Range Status   Specimen Description   Final    BLOOD RIGHT ANTECUBITAL Performed at [facility] [address] [place], [place] [address]    Special Requests   Final    BOTTLES DRAWN AEROBIC ONLY Blood Culture adequate volume Performed at [facility] [address] [place], [place] [address]    Culture   Final    NO GROWTH < 12 HOURS Performed at [facility] [address], [place], [place] [address]    Report Status PENDING  Incomplete  MRSA PCR Screening     Status: None   Collection Time: 02/10/21 11:48 PM   Specimen: Nasopharyngeal  Result Value Ref Range Status   MRSA by PCR NEGATIVE NEGATIVE Final    Comment:        The GeneXpert MRSA Assay (FDA approved for NASAL specimens only), is one component of a comprehensive MRSA colonization surveillance program. It is not intended to diagnose MRSA infection nor to guide or monitor treatment for MRSA infections. Performed at [facility], [place] [address] [place], [place] [address]       Radiology Studies: CT Angio Chest PE W and/or Wo Contrast  Result Date: 02/10/2021 CLINICAL DATA:  Chemotherapy.  Fluid retention. EXAM: CT ANGIOGRAPHY CHEST WITH CONTRAST TECHNIQUE: Multidetector CT imaging of the chest was performed using the standard protocol during bolus administration of intravenous contrast. Multiplanar CT image reconstructions and MIPs were obtained to evaluate the vascular anatomy. CONTRAST:  163m OMNIPAQUE IOHEXOL 350 MG/ML SOLN COMPARISON:  07/07/2020 FINDINGS: Cardiovascular: Contrast  injection is sufficient to demonstrate satisfactory opacification of the pulmonary arteries to the segmental level. There is no pulmonary embolus or evidence of right heart strain. The size of the main pulmonary artery is normal. Heart size is normal, with no pericardial effusion. The course and caliber of the aorta are normal. There is no atherosclerotic calcification. Opacification decreased due to pulmonary arterial phase contrast bolus timing. Mediastinum/Nodes: No mediastinal, hilar or axillary lymphadenopathy. Normal visualized thyroid. Thoracic esophageal course is normal. Lungs/Pleura: There are numerous small nodules throughout both lungs, as previously demonstrated. There is asymmetric mixed density opacity in the right lung, predominantly ground glass. Small pleural effusions. Upper Abdomen: Contrast bolus timing is not optimized for evaluation of the abdominal organs. The visualized portions of the organs of the upper abdomen are normal. Musculoskeletal: Unchanged multilevel vertebral augmentation and midthoracic vertebra plana. Review of the MIP images confirms the above findings. IMPRESSION: 1. No pulmonary embolus or acute aortic syndrome. 2. Asymmetric mixed density opacity in the right lung, predominantly ground glass. This may indicate infection or asymmetric pulmonary edema. 3. Numerous small nodules throughout both lungs, as previously demonstrated. 4. Small pleural effusions. Electronically Signed   By: KUlyses JarredM.D.   On: 02/10/2021 20:38   Portable chest 1 View  Result Date: 02/11/2021 CLINICAL DATA:  Respiratory failure EXAM: PORTABLE CHEST 1 VIEW COMPARISON:  Yesterday FINDINGS: Improved aeration. Small pleural effusions and generalized coarsened lung markings with numerous small nodules by CT. Normal heart size. Negative for air leak. IMPRESSION: 1. Rapidly improved aeration compatible with improved edema. 2. Background chronic disease.  Small pleural effusions. Electronically  Signed   By: JMonte FantasiaM.D.   On: 02/11/2021 04:54   DG Chest Port 1 View  Result Date: 02/10/2021 CLINICAL DATA:  Shortness of breath.  Multiple myeloma EXAM: PORTABLE CHEST 1 VIEW COMPARISON:  July 26, 2020. FINDINGS: There is extensive airspace opacity throughout  much of the right mid and lower lung region. Small pleural effusions noted bilaterally. There is atelectatic change in the left base. The heart size and pulmonary vascularity are normal. No adenopathy. Bones are osteoporotic. IMPRESSION: Extensive airspace opacity throughout much of the right lung. Small pleural effusions bilaterally with left base atelectasis. Appearance is consistent with multifocal pneumonia on the right. An allergic type response is a differential consideration, although allergic type phenomenon is usually more symmetric than is seen in this case. Heart size normal.  No adenopathy.  Bones osteoporotic. Electronically Signed   By: Lowella Grip III M.D.   On: 02/10/2021 15:37    Scheduled Meds: . acyclovir  400 mg Oral BID  . Chlorhexidine Gluconate Cloth  6 each Topical Daily  . enoxaparin (LOVENOX) injection  40 mg Subcutaneous Daily  . furosemide  20 mg Intravenous BID  . mouth rinse  15 mL Mouth Rinse BID  . pantoprazole  40 mg Oral Daily  . rosuvastatin  10 mg Oral Daily   Continuous Infusions:   LOS: 1 day   Time spent: 38 minutes   Darliss Cheney, MD [facility]  02/11/2021, 10:57 AM   To contact the attending provider between 7A-7P or the covering provider during after hours 7P-7A, please log into the web site [email or web address].

## 2021-02-12 DIAGNOSIS — J9601 Acute respiratory failure with hypoxia: Secondary | ICD-10-CM | POA: Diagnosis not present

## 2021-02-12 LAB — CBC WITH DIFFERENTIAL/PLATELET
Abs Immature Granulocytes: 0.04 10*3/uL (ref 0.00–0.07)
Basophils Absolute: 0 10*3/uL (ref 0.0–0.1)
Basophils Relative: 0 %
Eosinophils Absolute: 0.1 10*3/uL (ref 0.0–0.5)
Eosinophils Relative: 2 %
HCT: 29.7 % — ABNORMAL LOW (ref 36.0–46.0)
Hemoglobin: 10.2 g/dL — ABNORMAL LOW (ref 12.0–15.0)
Immature Granulocytes: 1 %
Lymphocytes Relative: 24 %
Lymphs Abs: 1.9 10*3/uL (ref 0.7–4.0)
MCH: 33.2 pg (ref 26.0–34.0)
MCHC: 34.3 g/dL (ref 30.0–36.0)
MCV: 96.7 fL (ref 80.0–100.0)
Monocytes Absolute: 0.8 10*3/uL (ref 0.1–1.0)
Monocytes Relative: 10 %
Neutro Abs: 5.3 10*3/uL (ref 1.7–7.7)
Neutrophils Relative %: 63 %
Platelets: 242 10*3/uL (ref 150–400)
RBC: 3.07 MIL/uL — ABNORMAL LOW (ref 3.87–5.11)
RDW: 14.9 % (ref 11.5–15.5)
WBC: 8.2 10*3/uL (ref 4.0–10.5)
nRBC: 0 % (ref 0.0–0.2)

## 2021-02-12 LAB — BASIC METABOLIC PANEL
Anion gap: 8 (ref 5–15)
BUN: 21 mg/dL (ref 8–23)
CO2: 26 mmol/L (ref 22–32)
Calcium: 7.6 mg/dL — ABNORMAL LOW (ref 8.9–10.3)
Chloride: 93 mmol/L — ABNORMAL LOW (ref 98–111)
Creatinine, Ser: 0.87 mg/dL (ref 0.44–1.00)
GFR, Estimated: >60 mL/min (ref >60)
Glucose, Bld: 98 mg/dL (ref 70–99)
Potassium: 3.6 mmol/L (ref 3.5–5.1)
Sodium: 127 mmol/L — ABNORMAL LOW (ref 135–145)

## 2021-02-12 LAB — LEGIONELLA PNEUMOPHILA SEROGP 1 UR AG: L. pneumophila Serogp 1 Ur Ag: NEGATIVE

## 2021-02-12 LAB — PROCALCITONIN: Procalcitonin: 0.1 ng/mL

## 2021-02-12 LAB — MAGNESIUM: Magnesium: 1.8 mg/dL (ref 1.7–2.4)

## 2021-02-12 NOTE — Progress Notes (Signed)
Patient given discharge teaching @ [phone]. Patient verbalized understanding. @1000  Patient taken down to entrance by nurse in wheel chair. Patient's daughter was awaiting pickup.

## 2021-02-12 NOTE — Discharge Instructions (Signed)
Pulmonary Edema Pulmonary edema is a condition in which fluid collects in the air sacs of the lung. This makes it hard for the lungs to fill with air. It also prevents the lungs from moving oxygen into the bloodstream, which can affect other organs, such as the brain and kidneys. Pulmonary edema is an emergency and should be treated immediately. There are two main types of pulmonary edema:  Cardiogenic. This means the pulmonary edema was caused by a problem with the heart.  Noncardiogenic. This means the pulmonary edema was caused by something other than the heart, such as an injury to the lung. What are the causes? This condition is commonly caused by heart failure. When this happens, the heart is not able to properly pump blood through the body. This can lead to increased pressure in the heart and blood building up in the veins around the lungs. When blood builds up in these veins, fluid gets pushed into the air sacs of the lung. Heart failure may be caused by:  Coronary artery disease.  High blood pressure.  Viral infection of the heart (myocarditis).  Leaky or stiff heart valves.  Irregular heartbeat (arrhythmia).  Fluid buildup caused by kidney problems. Other causes include:  Infection in the lungs (pneumonia), blood (sepsis), or other part of the body.  Severe injury to the chest.  Lung injury from heat or toxins, such as breathing in smoke or poisonous gas.  Inhaling vomit or water (pulmonaryaspiration).  Certain medicines.  High altitude. What are the signs or symptoms? Symptoms of this condition include:  Shortness of breath.  Coughing with frothy or bloody mucus.  Wheezing.  Feeling like you cannot get enough air.  Shallow and fast breathing.  Skin that is cool and damp, and has a pale or bluish color. How is this diagnosed? This condition is diagnosed based on:  Your medical history.  A physical exam.  Your symptoms. You may also have other tests,  including:  Chest X-ray.  Chest CT scan.  Blood tests, including checking the amount of oxygen in the blood.  Sputum culture. This test checks for infection in the mucus that you cough up from your lungs.  Electrocardiogram. This measures the electrical signals of the heart.  Echocardiogram. This uses an ultrasound to evaluate the health of the heart. How is this treated? Initial treatment for this condition focuses on relieving your symptoms. Treatment depends on the underlying cause of the condition. This may include:  Oxygen therapy. The oxygen may be given through tubes in your nose or through a face mask. In severe cases, a breathing tube is inserted into the windpipe and hooked up to a breathing machine (ventilator).  Medicines. These may include medicines to: ? Help the body get rid of extra water (diuretics). ? Help the heart pump blood properly. ? Prevent or destroy blood clots. If poor heart function is the cause, treatment may also include:  Procedures to open blocked arteries, repair damaged heart valves, or remove some of the damaged heart muscle.  A pacemaker to help with heart function.  A procedure that uses electric shocks to regulate heart rate (cardioversion). If an infection is the cause, treatment may include antibiotic medicines. Follow these instructions at home: Medicines  Take over-the-counter and prescription medicines only as told by your health care provider.  If you were prescribed an antibiotic, take it as told by your health care provider. Do not stop taking the antibiotic even if you start to feel better.  Have a plan with information about each medicine you take. This should include: ? Why you take the medicine. ? Possible side effects. ? Best time of day to take it. ? Foods to take with it, or foods to avoid when taking it. ? When to call your health care provider.  Make a list of each medicine, vitamin, or herbal supplement you take. Keep  the list with you at all times. Show it to your health care provider at each visit and before starting a new medicine. Update the list as you add or stop medicines. Lifestyle  Exercise regularly as told by your health care provider. It is important to do it safely. You can do this by: ? Pacing your activities to avoid shortness of breath or chest pain. ? Resting for at least 1 hour before and after meals. ? Asking about cardiac rehabilitation programs. These may include education, exercise plans, and counseling.  Eat a heart-healthy diet that is low in salt (sodium), saturated fat, and cholesterol. Your health care provider may recommend foods that are high in fiber, such as fresh fruits and vegetables, whole grains, and beans.  Do not use any products that contain nicotine or tobacco, such as cigarettes and e-cigarettes. If you need help quitting, ask your health care provider.   General instructions  Maintain a healthy weight.  Keep a record of your weight: ? Record your hospital or clinic weight. When you get home, compare it to your scale and record your weight. ? Weigh yourself first thing each morning after you urinate and before you eat breakfast. Wear the same amount of clothing each time. Record the weights. ? Share your weight record with your health care provider. Daily weights are important in detecting the body's retention of excess fluid. ? Tell your health care provider right away if you gain weight quickly. Your medicines may need to be adjusted.  Check and record your blood pressure as often as told by your health care provider. Bring the records with you to clinic visits.  Consider therapy or joining a support group. This may help with any stress, fear, or anxiety.  Keep all follow-up visits as told by your health care provider. This is important. Get help right away if:  You gain weight quickly.  You have severe chest pain, especially if the pain is crushing or  pressure-like and spreads to the arms, back, neck, or jaw.  You have more swelling in your hands, feet, ankles, or abdomen.  You have nausea.  You have unusual sweating or your skin turns blue or pale.  Your shortness of breath gets worse.  You have dizziness, blurred vision, a headache, or unsteadiness.  Your blood pressure is higher than 180/120.  You cough up bloody mucus (sputum).  You cannot sleep because it is hard to breathe.  You feel a racing heart beat (palpitations).  You have anxiety or a feeling that you cannot get enough air. These symptoms may represent a serious problem that is an emergency. Do not wait to see if the symptoms will go away. Get medical help right away. Call your local emergency services (911 in the [place] Do not drive yourself to the hospital. Summary  Pulmonary edema is a condition in which fluid collects in the air sacs of your lungs. If left untreated, it can lead to a medical emergency.  This condition is most commonly caused by heart failure. Other causes can include infections or injury to the lungs.  Take  over-the-counter and prescription medicines only as told by your health care provider. This information is not intended to replace advice given to you by your health care provider. Make sure you discuss any questions you have with your health care provider. Document Revised: 11/22/2017 Document Reviewed: 02/20/2017 Elsevier Patient Education  2021 [employer].

## 2021-02-12 NOTE — Progress Notes (Signed)
Occupational Therapy Evaluation  Patient lives alone in 2 story house with 5 STE and 14 steps to bed/bath. Pt is I at baseline, likes to garden and takes care of her cat. Patient demonstrates independence with toilet tasks, hand hygiene, LB dressing and functional ambulation +transfers with no loss of balance/safety concerns. No further acute OT needs at this time, will discontinue. Please re-consult if new needs arise.    02/12/21 0900  OT Visit Information  Last OT Received On 02/12/21  Assistance Needed +1  PT/OT/SLP Co-Evaluation/Treatment Yes  Reason for Co-Treatment To address functional/ADL transfers  History of Present Illness Pt admitted with hemoptysis and acute hypoxemic respiratory failure.  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  Precautions  Precautions None  Restrictions  Weight Bearing Restrictions No  Home Living  Family/patient expects to be discharged to: Private residence  Living Arrangements Alone  Available Help at Discharge Family;Available PRN/intermittently  Type of Home House  Home Access Stairs to enter  Entrance Stairs-Number of Steps 5  Entrance Stairs-Rails Right  Home Layout Two level  Alternate Level Stairs-Number of Steps 14  Bathroom Shower/Tub Tub/shower unit  Loss adjuster, chartered  Prior Function  Level of Independence Independent  Communication  Communication No difficulties  Pain Assessment  Pain Assessment No/denies pain  Cognition  Arousal/Alertness Awake/alert  Behavior During Therapy WFL for tasks assessed/performed  Overall Cognitive Status Within Functional Limits for tasks assessed  Upper Extremity Assessment  Upper Extremity Assessment Overall WFL for tasks assessed  Lower Extremity Assessment  Lower Extremity Assessment Defer to PT evaluation  Cervical / Trunk Assessment  Cervical / Trunk Assessment Normal  ADL  Overall ADL's  Modified independent;At baseline  General ADL  Comments patient able to perform toilet tasks, hand hygiene, ambulate from bathroom and complete LB dressing task without assistance  Bed Mobility  Overal bed mobility Modified Independent  General bed mobility comments no physical assist  Transfers  Overall transfer level Modified independent  Balance  Overall balance assessment Independent  General Comments  General comments (skin integrity, edema, etc.) 98% on RA, Hr 113 with ambulation  OT - End of Session  Activity Tolerance Patient tolerated treatment well  Patient left in bed;with call bell/phone within reach  Nurse Communication Mobility status  OT Assessment  OT Recommendation/Assessment Patient does not need any further OT services  OT Visit Diagnosis Other abnormalities of gait and mobility (R26.89)  OT Problem List Decreased activity tolerance  AM-PAC OT "6 Clicks" Daily Activity Outcome Measure (Version 2)  Help from another person eating meals? 4  Help from another person taking care of personal grooming? 4  Help from another person toileting, which includes using toliet, bedpan, or urinal? 4  Help from another person bathing (including washing, rinsing, drying)? 4  Help from another person to put on and taking off regular upper body clothing? 4  Help from another person to put on and taking off regular lower body clothing? 4  6 Click Score 24  OT Recommendation  Follow Up Recommendations No OT follow up  OT Equipment None recommended by OT  Acute Rehab OT Goals  Patient Stated Goal home to her cat  OT Goal Formulation All assessment and education complete, DC therapy  OT Time Calculation  OT Start Time (ACUTE ONLY) 0827  OT Stop Time (ACUTE ONLY) 0841  OT Time Calculation (min) 14 min  OT General Charges  $OT Visit 1 Visit  OT  Evaluation  $OT Eval Low Complexity 1 Low  Written Expression  Dominant Hand  (did not specify)   Delbert Phenix OT OT pager: [phone]

## 2021-02-12 NOTE — Discharge Summary (Signed)
Physician Discharge Summary  Wendy [name] Asa UEA:[id number] DOB: [birth year] DOA: 02/10/2021  PCP: Abner Greenspan, MD  Admit date: 02/10/2021 Discharge date: 02/12/2021 30 Day Unplanned Readmission Risk Score   Flowsheet Row ED to Hosp-Admission (Current) from 02/10/2021 in [facility]-ICU/STEPDOWN  30 Day Unplanned Readmission Risk Score (%) 16.84 Filed at 02/12/2021 0801     This score is the patient's risk of an unplanned readmission within 30 days of being discharged (0 -100%). The score is based on dignosis, age, lab data, medications, orders, and past utilization.   Low:  0-14.9   Medium: 15-21.9   High: 22-29.9   Extreme: 30 and above         Admitted From: Home Disposition: Home  Recommendations for Outpatient Follow-up:  1. Follow up with PCP in 1-2 weeks 2. Follow-up with pulmonology in 2 to 4 weeks 3. Follow-up with your cardiologist in 2 weeks 4. Follow-up with your oncologist as scheduled 5. Please obtain BMP/CBC in one week 6. Please follow up with your PCP on the following pending results: Unresulted Labs (From admission, onward)          Start     Ordered   02/10/21 1810  Legionella Pneumophila Serogp 1 Ur Ag  Once,   STAT        02/10/21 1810   02/10/21 1515  Urine Culture  Once,   STAT        02/10/21 [place]: None Equipment/Devices: None  Discharge Condition: Stable CODE STATUS: Full code Diet recommendation: Cardiac  Subjective: Seen and examined.  Feels much better.  No shortness of breath or any other complaint.  Excited to go home today.  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 She reports she did well right after chemo butthenhad difficulty urinating and was not able to take deep breath was having bilateral chest heaviness,she tried albuterol inhaler that she was prescribed and noticed some hemoptysis today,see fail her abdomen was more tight and uncomfortable. She denies any fever, nausea, vomiting, abdomen pain, focal weakness.No known history of CAD CHF.  ED Course:She has been tachycardic in low 100, was hypoxic as low as 85% placed on 4 L nasal cannula saturating 95%,T-max 97.5 blood work showed worsening hyponatremia at 124, renal function BUN is stable, BNP elevated 703, troponin elevated 72, lactic acid 1.8, leukocytosis 17.7 UAwashazy with WBC 21-50 but no urinary symptoms-Foley catheter was placed andhadno significant urine retention. Patient was given IV Lasix.Oncology was consulted who advised admission and further work-up with echo CT scan pulmonary consultation.Given patient's hemoptysis, significant hypoxia elevated troponin and chest heaviness CT was also ordered (Discussedwith patient risk ( including renal toxicity, but has stable renal fun as well as after discussing benefits, she wants to proceed- EDp has ordered CTA),her repeat troponin is pending-EDP is following.  Brief/Interim Summary: Patient was admitted under hospital service with acute hypoxic respiratory failure secondary to acute pulmonary edema and possible pneumonitis on the right lung.  She also had elevated BNP.  CT angiogram of chest ruled out PE or acute aortic dissection.  Patient was diuresed with IV Lasix.  Troponins were trending up but not high enough.  They were consistent with demand ischemia.  Transthoracic echo did not show any wall motion normal the and she had normal ejection fraction but grade 1 diastolic dysfunction.  Patient states that she thinks that she has been told in the past that she does have some diastolic dysfunction.  Patient has  been weaned down to room air since yesterday.  Has had good diuresis.  EKG did not show any acute ST-T wave changes.  She also had mild leukocytosis which could very well be just reactive.  No signs of infection.  She was not given any antibiotics.  She was seen by PCCM.  They signed off and recommended outpatient follow-up due to one-sided infiltrate/pneumonitis.  It has been presumed that 1 or 2 of the agents of her chemotherapy may have caused the side effects.  Nonetheless, patient is a stable without any symptoms so she is going to be discharged back to home.  PCCM had directly talk to patient's primary oncologist about possibly thinking about switching chemotherapy agents. Of note, cardiology was also consulted at the time of admission but not seen.  Discharge Diagnoses:  Principal Problem:   Respiratory failure, acute ([place]) Active Problems:   Essential hypertension   Hyponatremia   Degenerative disc disease, lumbar   Chronic back pain   Hyperlipidemia   Monoclonal gammopathy   Multiple myeloma (HCC)   Leucocytosis   Elevated troponin I level    Discharge Instructions   Allergies as of 02/12/2021      Reactions   Bentyl [dicyclomine Hcl]    Groggy, blurred vision   Butalbital-aspirin-caffeine Other (See Comments)   hallucinations   Clonidine Derivatives    dizziness, lightheadedness, abdominal cramping, dry mouth/throat   Linzess [linaclotide] Diarrhea   Morphine And Related Other (See Comments)   Does not work   Motrin [ibuprofen]    GI upset   Penicillins Other (See Comments)   As child; reaction unknown Has patient had a PCN reaction causing immediate rash, facial/tongue/throat swelling, SOB or lightheadedness with hypotension: No Has patient had a PCN reaction causing severe rash involving mucus membranes or skin necrosis: No Has patient had a PCN reaction that required hospitalization: No Has patient had a PCN reaction occurring within the last 10 years: No If all  of the above answers are "NO", then may proceed with Cephalosporin use.   Sulfa Antibiotics    In childhood   Zanaflex [tizanidine Hcl] Other (See Comments)   Decreased BP   Diphenhydramine Hcl Palpitations   restlessness      Medication List    TAKE these medications   acyclovir 400 MG tablet Commonly known as: ZOVIRAX Take 1 tablet (400 mg total) by mouth 2 (two) times daily.   albuterol 108 (90 Base) MCG/ACT inhaler Commonly known as: VENTOLIN HFA Inhale 2 puffs into the lungs every 6 (six) hours as needed for wheezing or shortness of breath.   alclomethasone 0.05 % ointment Commonly known as: ACLOVATE APPLY TOPICALLY TO LIPS DAILY AS NEEDED What changed: See the new instructions.   ALPRAZolam 0.5 MG tablet Commonly known as: Xanax Take 1 tablet (0.5 mg total) by mouth 2 (two) times daily as needed for anxiety. Caution of sedation What changed:   how much to take  when to take this   Citrucel oral powder Generic drug: methylcellulose Take 1 packet by mouth 2 (two) times daily as needed (nutrition).   denosumab 60 MG/ML Sosy injection Commonly known as: PROLIA Inject 60 mg into the skin every 6 (six) months.   esomeprazole 40  MG capsule Commonly known as: NEXIUM Take 1 capsule (40 mg total) by mouth daily.   fluticasone 50 MCG/ACT nasal spray Commonly known as: FLONASE use 2 sprays in each nostril once daily as needed What changed:   how much to take  how to take this  when to take this  reasons to take this  additional instructions   losartan 100 MG tablet Commonly known as: COZAAR Take 1 tablet (100 mg total) by mouth daily.   ondansetron 8 MG tablet Commonly known as: ZOFRAN Take 1 tablet (8 mg total) by mouth every 8 (eight) hours as needed for nausea or vomiting.   prochlorperazine 10 MG tablet Commonly known as: COMPAZINE Take 1 tablet (10 mg total) by mouth every 6 (six) hours as needed for nausea or vomiting.   rosuvastatin 10 MG  tablet Commonly known as: CRESTOR Take 1 tablet (10 mg total) by mouth daily.   traMADol 50 MG tablet Commonly known as: ULTRAM TAKE 1 TO 2 TABLETS(50 TO 100 MG) BY MOUTH EVERY 6 HOURS AS NEEDED   TUMS PO Take 2-4 capsules by mouth daily as needed (heartburn).   Vitamin D 50 MCG (2000 UT) tablet Take 2,000 Units by mouth daily.   zolpidem 10 MG tablet Commonly known as: AMBIEN TAKE ONE TABLET BY MOUTH AT BEDTIME AS NEEDED FOR SLEEP       Follow-up Information    Tower, Audrie Gallus, MD Follow up in 1 week(s).   Specialties: Family Medicine, Radiology Contact information: [address] [place] [address] [phone]        Luciano Cutter, MD Follow up in 2 week(s).   Specialty: Pulmonary Disease Contact information: [address] [place] [address] [phone]              Allergies  Allergen Reactions  . Bentyl [Dicyclomine Hcl]     Groggy, blurred vision  . Butalbital-Aspirin-Caffeine Other (See Comments)    hallucinations  . Clonidine Derivatives     dizziness, lightheadedness, abdominal cramping, dry mouth/throat  . Linzess [Linaclotide] Diarrhea  . Morphine And Related Other (See Comments)    Does not work  . Motrin [Ibuprofen]     GI upset  . Penicillins Other (See Comments)    As child; reaction unknown Has patient had a PCN reaction causing immediate rash, facial/tongue/throat swelling, SOB or lightheadedness with hypotension: No Has patient had a PCN reaction causing severe rash involving mucus membranes or skin necrosis: No Has patient had a PCN reaction that required hospitalization: No Has patient had a PCN reaction occurring within the last 10 years: No If all of the above answers are "NO", then may proceed with Cephalosporin use.  . Sulfa Antibiotics     In childhood  . Zanaflex [Tizanidine Hcl] Other (See Comments)    Decreased BP  . Diphenhydramine Hcl Palpitations    restlessness    Consultations: PCCM  and oncology.   Procedures/Studies: CT Angio Chest PE W and/or Wo Contrast  Result Date: 02/10/2021 CLINICAL DATA:  Chemotherapy.  Fluid retention. EXAM: CT ANGIOGRAPHY CHEST WITH CONTRAST TECHNIQUE: Multidetector CT imaging of the chest was performed using the standard protocol during bolus administration of intravenous contrast. Multiplanar CT image reconstructions and MIPs were obtained to evaluate the vascular anatomy. CONTRAST:  OMNIPAQUE IOHEXOL 350 MG/ML SOLN COMPARISON:  07/07/2020 FINDINGS: Cardiovascular: Contrast injection is sufficient to demonstrate satisfactory opacification of the pulmonary arteries to the segmental level. There is no pulmonary embolus  or evidence of right heart strain. The size of the main pulmonary artery is normal. Heart size is normal, with no pericardial effusion. The course and caliber of the aorta are normal. There is no atherosclerotic calcification. Opacification decreased due to pulmonary arterial phase contrast bolus timing. Mediastinum/Nodes: No mediastinal, hilar or axillary lymphadenopathy. Normal visualized thyroid. Thoracic esophageal course is normal. Lungs/Pleura: There are numerous small nodules throughout both lungs, as previously demonstrated. There is asymmetric mixed density opacity in the right lung, predominantly ground glass. Small pleural effusions. Upper Abdomen: Contrast bolus timing is not optimized for evaluation of the abdominal organs. The visualized portions of the organs of the upper abdomen are normal. Musculoskeletal: Unchanged multilevel vertebral augmentation and midthoracic vertebra plana. Review of the MIP images confirms the above findings. IMPRESSION: 1. No pulmonary embolus or acute aortic syndrome. 2. Asymmetric mixed density opacity in the right lung, predominantly ground glass. This may indicate infection or asymmetric pulmonary edema. 3. Numerous small nodules throughout both lungs, as previously demonstrated. 4. Small  pleural effusions. Electronically Signed   By: Ulyses Jarred M.D.   On: 02/10/2021 20:38   CT Biopsy  Result Date: 01/24/2021 CLINICAL DATA:  Proteinuria and need for fat pad biopsy for amyloidosis workup. EXAM: ULTRASOUND GUIDED BIOPSY OF ABDOMINAL WALL FAT ANESTHESIA/SEDATION: The procedure followed a bone marrow biopsy with conscious sedation and no additional sedation medication was given for the fat pad biopsy. PROCEDURE: The procedure risks, benefits, and alternatives were explained to the patient. Questions regarding the procedure were encouraged and answered. The patient understands and consents to the procedure. A time-out was performed prior to initiating the procedure. The anterior abdominal wall was prepped with chlorhexidine in a sterile fashion, and a sterile drape was applied covering the operative field. A sterile gown and sterile gloves were used for the procedure. Local anesthesia was provided with 1% Lidocaine. Under ultrasound guidance, a 10 gauge, 10 cm length bone biopsy needle was advanced into the subcutaneous fat of the lower abdominal wall. The inner trocar was removed and aspiration of fat performed through the needle while performing suction with a syringe as the needle was moved back and forth in the abdominal wall fat. This maneuver was performed twice in obtaining fat globules that were then placed in formalin. COMPLICATIONS: None FINDINGS: Sufficient abdominal wall fat was obtained. IMPRESSION: Ultrasound-guided abdominal fat pad biopsy. Electronically Signed   By: Aletta Edouard M.D.   On: 01/24/2021 13:42   Portable chest 1 View  Result Date: 02/11/2021 CLINICAL DATA:  Respiratory failure EXAM: PORTABLE CHEST 1 VIEW COMPARISON:  Yesterday FINDINGS: Improved aeration. Small pleural effusions and generalized coarsened lung markings with numerous small nodules by CT. Normal heart size. Negative for air leak. IMPRESSION: 1. Rapidly improved aeration compatible with improved  edema. 2. Background chronic disease.  Small pleural effusions. Electronically Signed   By: Monte Fantasia M.D.   On: 02/11/2021 04:54   DG Chest Port 1 View  Result Date: 02/10/2021 CLINICAL DATA:  Shortness of breath.  Multiple myeloma EXAM: PORTABLE CHEST 1 VIEW COMPARISON:  July 26, 2020. FINDINGS: There is extensive airspace opacity throughout much of the right mid and lower lung region. Small pleural effusions noted bilaterally. There is atelectatic change in the left base. The heart size and pulmonary vascularity are normal. No adenopathy. Bones are osteoporotic. IMPRESSION: Extensive airspace opacity throughout much of the right lung. Small pleural effusions bilaterally with left base atelectasis. Appearance is consistent with multifocal pneumonia on the right. An allergic  type response is a differential consideration, although allergic type phenomenon is usually more symmetric than is seen in this case. Heart size normal.  No adenopathy.  Bones osteoporotic. Electronically Signed   By: Lowella Grip III M.D.   On: 02/10/2021 15:37   CT BONE MARROW BIOPSY & ASPIRATION  Result Date: 01/24/2021 CLINICAL DATA:  Monoclonal gammopathy and need for bone marrow biopsy. EXAM: CT GUIDED BONE MARROW ASPIRATION AND BIOPSY ANESTHESIA/SEDATION: Versed 3.0 mg IV, Fentanyl 100 mcg IV Total Moderate Sedation Time:   11 minutes. The patient's level of consciousness and physiologic status were continuously monitored during the procedure by Radiology nursing. PROCEDURE: The procedure risks, benefits, and alternatives were explained to the patient. Questions regarding the procedure were encouraged and answered. The patient understands and consents to the procedure. A time out was performed prior to initiating the procedure. The right gluteal region was prepped with chlorhexidine. Sterile gown and sterile gloves were used for the procedure. Local anesthesia was provided with 1% Lidocaine. Under CT guidance, an 11  gauge On Control bone cutting needle was advanced from a posterior approach into the right iliac bone. Needle positioning was confirmed with CT. Initial non heparinized and heparinized aspirate samples were obtained of bone marrow. Core biopsy was performed via the On Control drill needle. COMPLICATIONS: None FINDINGS: Inspection of initial aspirate did reveal visible particles. Intact core biopsy sample was obtained. IMPRESSION: CT guided bone marrow biopsy of right posterior iliac bone with both aspirate and core samples obtained. Electronically Signed   By: Aletta Edouard M.D.   On: 01/24/2021 13:39   ECHOCARDIOGRAM COMPLETE  Result Date: 02/11/2021    ECHOCARDIOGRAM REPORT   Patient Name:   Wendy [name] Date of Exam: 02/11/2021 Medical Rec #:  [id number]     Height:       62.5 in Accession #:    [id number]    Weight:       132.5 lb Date of Birth:  Oct 16, 1946     BSA:          1.614 m Patient Age:    74 years      BP:           110/50 mmHg Patient Gender: F             HR:           108 bpm. Exam Location:  Inpatient Procedure: 2D Echo, Cardiac Doppler and Color Doppler Indications:    CHF-Acute Diastolic W29.56  History:        Patient has no prior history of Echocardiogram examinations.                 Risk Factors:Hypertension.  Sonographer:    Bernadene Person RDCS Referring Phys: [id number] [place] IMPRESSIONS  1. Left ventricular ejection fraction, by estimation, is 60 to 65%. The left ventricle has normal function. The left ventricle has no regional wall motion abnormalities. Left ventricular diastolic parameters are consistent with Grade I diastolic dysfunction (impaired relaxation). Elevated left ventricular end-diastolic pressure. The E/e' is 70.  2. Right ventricular systolic function is normal. The right ventricular size is normal. There is normal pulmonary artery systolic pressure. The estimated right ventricular systolic pressure is 78.4 mmHg.  3. The mitral valve is grossly normal. Trivial mitral  valve regurgitation.  4. The aortic valve is tricuspid. Aortic valve regurgitation is not visualized.  5. The inferior vena cava is normal in size with greater than 50% respiratory variability, suggesting right atrial  pressure of 3 mmHg. Comparison(s): No prior Echocardiogram. FINDINGS  Left Ventricle: Left ventricular ejection fraction, by estimation, is 60 to 65%. The left ventricle has normal function. The left ventricle has no regional wall motion abnormalities. The left ventricular internal cavity size was normal in size. There is  no left ventricular hypertrophy. Left ventricular diastolic parameters are consistent with Grade I diastolic dysfunction (impaired relaxation). Elevated left ventricular end-diastolic pressure. The E/e' is 15. Right Ventricle: The right ventricular size is normal. No increase in right ventricular wall thickness. Right ventricular systolic function is normal. There is normal pulmonary artery systolic pressure. The tricuspid regurgitant velocity is 2.83 m/s, and  with an assumed right atrial pressure of 3 mmHg, the estimated right ventricular systolic pressure is 46.2 mmHg. Left Atrium: Left atrial size was normal in size. Right Atrium: Right atrial size was normal in size. Pericardium: There is no evidence of pericardial effusion. Mitral Valve: The mitral valve is grossly normal. Trivial mitral valve regurgitation. Tricuspid Valve: The tricuspid valve is grossly normal. Tricuspid valve regurgitation is trivial. Aortic Valve: The aortic valve is tricuspid. Aortic valve regurgitation is not visualized. Pulmonic Valve: The pulmonic valve was not well visualized. Pulmonic valve regurgitation is trivial. Aorta: The aortic root and ascending aorta are structurally normal, with no evidence of dilitation. Venous: The inferior vena cava is normal in size with greater than 50% respiratory variability, suggesting right atrial pressure of 3 mmHg. IAS/Shunts: No atrial level shunt detected by  color flow Doppler.  LEFT VENTRICLE PLAX 2D LVIDd:         3.91 cm  Diastology LVIDs:         2.91 cm  LV e' medial:    4.70 cm/s LV PW:         0.84 cm  LV E/e' medial:  17.4 LV IVS:        0.93 cm  LV e' lateral:   6.57 cm/s LVOT diam:     1.70 cm  LV E/e' lateral: 12.5 LV SV:         46 LV SV Index:   29 LVOT Area:     2.27 cm  RIGHT VENTRICLE RV S prime:     13.80 cm/s TAPSE (M-mode): 1.5 cm LEFT ATRIUM         Index LA diam:    3.60 cm 2.23 cm/m  AORTIC VALVE LVOT Vmax:   119.00 cm/s LVOT Vmean:  88.800 cm/s LVOT VTI:    0.203 m  AORTA Ao Root diam: 2.90 cm Ao Asc diam:  2.70 cm MITRAL VALVE               TRICUSPID VALVE MV Area (PHT): 3.08 cm    TR Peak grad:   32.0 mmHg MV Decel Time: 246 msec    TR Vmax:        283.00 cm/s MV E velocity: 82.00 cm/s MV A velocity: 91.20 cm/s  SHUNTS MV E/A ratio:  0.90        Systemic VTI:  0.20 m                            Systemic Diam: 1.70 cm Lyman Bishop MD Electronically signed by Lyman Bishop MD Signature Date/Time: 02/11/2021/12:47:18 PM    Final    CT US GUIDED BIOPSY  Result Date: 01/24/2021 CLINICAL DATA:  Proteinuria and need for fat pad biopsy for amyloidosis workup. EXAM: ULTRASOUND GUIDED BIOPSY OF ABDOMINAL WALL  FAT ANESTHESIA/SEDATION: The procedure followed a bone marrow biopsy with conscious sedation and no additional sedation medication was given for the fat pad biopsy. PROCEDURE: The procedure risks, benefits, and alternatives were explained to the patient. Questions regarding the procedure were encouraged and answered. The patient understands and consents to the procedure. A time-out was performed prior to initiating the procedure. The anterior abdominal wall was prepped with chlorhexidine in a sterile fashion, and a sterile drape was applied covering the operative field. A sterile gown and sterile gloves were used for the procedure. Local anesthesia was provided with 1% Lidocaine. Under ultrasound guidance, a 10 gauge, 10 cm length bone biopsy  needle was advanced into the subcutaneous fat of the lower abdominal wall. The inner trocar was removed and aspiration of fat performed through the needle while performing suction with a syringe as the needle was moved back and forth in the abdominal wall fat. This maneuver was performed twice in obtaining fat globules that were then placed in formalin. COMPLICATIONS: None FINDINGS: Sufficient abdominal wall fat was obtained. IMPRESSION: Ultrasound-guided abdominal fat pad biopsy. Electronically Signed   By: Aletta Edouard M.D.   On: 01/24/2021 13:42      Discharge Exam: Vitals:   02/12/21 0750 02/12/21 0800  BP:  119/70  Pulse:  94  Resp:  11  Temp: 98 F (36.7 C)   SpO2:  97%   Vitals:   02/12/21 0500 02/12/21 0700 02/12/21 0750 02/12/21 0800  BP:    119/70  Pulse: 95 98  94  Resp: _0 Temp:   98 F (36.7 C)   TempSrc:   Oral   SpO2: 95% 96%  97%  Weight: 58.1 kg     Height:        General: Pt is alert, awake, not in acute distress Cardiovascular: RRR, S1/S2 +, no rubs, no gallops Respiratory: CTA bilaterally, no wheezing, no rhonchi Abdominal: Soft, NT, ND, bowel sounds + Extremities: +1 pitting edema left lower extremity, trace pitting edema right lower extremity.    The results of significant diagnostics from this hospitalization (including imaging, microbiology, ancillary and laboratory) are listed below for reference.     Microbiology: Recent Results (from the past 240 hour(s))  SARS CORONAVIRUS 2 (TAT 6-24 HRS) Nasopharyngeal Nasopharyngeal Swab     Status: None   Collection Time: 02/10/21  4:21 PM   Specimen: Nasopharyngeal Swab  Result Value Ref Range Status   SARS Coronavirus 2 NEGATIVE NEGATIVE Final    Comment: (NOTE) SARS-CoV-2 target nucleic acids are NOT DETECTED.  The SARS-CoV-2 RNA is generally detectable in upper and lower respiratory specimens during the acute phase of infection. Negative results do not preclude SARS-CoV-2 infection, do  not rule out co-infections with other pathogens, and should not be used as the sole basis for treatment or other patient management decisions. Negative results must be combined with clinical observations, patient history, and epidemiological information. The expected result is Negative.  Fact Sheet for Patients: SugarRoll.be  Fact Sheet for Healthcare Providers: https://www.woods-mathews.com/  This test is not yet approved or cleared by the [place] FDA and  has been authorized for detection and/or diagnosis of SARS-CoV-2 by FDA under an Emergency Use Authorization (EUA). This EUA will remain  in effect (meaning this test can be used) for the duration of the COVID-19 declaration under Se ction 564(b)(1) of the Act, 21 U.S.C. section 360bbb-3(b)(1), unless the authorization is terminated or revoked sooner.  Performed at [facility],  [address], [place], [place] [address]   Culture, blood (Routine X 2) w Reflex to ID Panel     Status: None (Preliminary result)   Collection Time: 02/10/21  4:40 PM   Specimen: BLOOD  Result Value Ref Range Status   Specimen Description   Final    BLOOD RIGHT ANTECUBITAL Performed at [facility] [address], [place], [place] [address]    Special Requests   Final    BOTTLES DRAWN AEROBIC ONLY Blood Culture adequate volume Performed at [facility] [address], [place], [place] [address]    Culture   Final    NO GROWTH < 12 HOURS Performed at [facility] [address], [place], [place] [address]    Report Status PENDING  Incomplete  Culture, blood (Routine X 2) w Reflex to ID Panel     Status: None (Preliminary result)   Collection Time: 02/10/21  4:40 PM   Specimen: BLOOD  Result Value Ref Range Status   Specimen Description   Final    BLOOD RIGHT ANTECUBITAL Performed at [facility] [address]  [place], [place] [address]    Special Requests   Final    BOTTLES DRAWN AEROBIC ONLY Blood Culture adequate volume Performed at [facility] [address], [place], [place] [address]    Culture   Final    NO GROWTH < 12 HOURS Performed at [facility] [address], [place],  [address]    Report Status PENDING  Incomplete  MRSA PCR Screening     Status: None   Collection Time: 02/10/21 11:48 PM   Specimen: Nasopharyngeal  Result Value Ref Range Status   MRSA by PCR NEGATIVE NEGATIVE Final    Comment:        The GeneXpert MRSA Assay (FDA approved for NASAL specimens only), is one component of a comprehensive MRSA colonization surveillance program. It is not intended to diagnose MRSA infection nor to guide or monitor treatment for MRSA infections. Performed at [facility], [place] [address] [place],  [address]      Labs: BNP (last 3 results) Recent Labs    02/10/21 1516 02/11/21 0236  BNP 703.9* 271.4*   Basic Metabolic Panel: Recent Labs  Lab 02/09/21 0730 02/10/21 1515 02/11/21 0236 02/12/21 0249  NA 127* 124* 127* 127*  K 4.0 4.3 3.8 3.6  CL 97* 89* 93* 93*  CO2 _0 GLUCOSE 118* 144* 98 98  BUN _1 CREATININE 0.78 0.78 0.72 0.87  CALCIUM 8.5* 8.6* 8.1* 7.6*  MG  --   --   --  1.8   Liver Function Tests: Recent Labs  Lab 02/09/21 0730 02/10/21 1515 02/11/21 0236  AST _2 ALT _3 ALKPHOS 85 79 61  BILITOT 0.4 0.7 0.6  PROT 5.7* 6.3* 4.8*  ALBUMIN 2.5* 3.0* 2.4*   Recent Labs  Lab 02/10/21 1515  LIPASE 36   No results for input(s): AMMONIA in the last 168 hours. CBC: Recent Labs  Lab 02/09/21 0730 02/10/21 1515 02/11/21 0236 02/12/21 0249  WBC 6.6 17.7* 10.5 8.2  NEUTROABS 4.7 16.0*  --  5.3  HGB 11.8* 13.6 10.8* 10.2*  HCT 32.6* 39.3 31.3* 29.7*  MCV 91.3 94.9 95.1 96.7  PLT 422* 408* 273 242   Cardiac Enzymes: No results for input(s): CKTOTAL,  CKMB, CKMBINDEX, TROPONINI in  the last 168 hours. BNP: Invalid input(s): POCBNP CBG: No results for input(s): GLUCAP in the last 168 hours. D-Dimer No results for input(s): DDIMER in the last 72 hours. Hgb A1c No results for input(s): HGBA1C in the last 72 hours. Lipid Profile No results for input(s): CHOL, HDL, LDLCALC, TRIG, CHOLHDL, LDLDIRECT in the last 72 hours. Thyroid function studies No results for input(s): TSH, T4TOTAL, T3FREE, THYROIDAB in the last 72 hours.  Invalid input(s): FREET3 Anemia work up No results for input(s): VITAMINB12, FOLATE, FERRITIN, TIBC, IRON, RETICCTPCT in the last 72 hours. Urinalysis    Component Value Date/Time   COLORURINE YELLOW 02/10/2021 1357   APPEARANCEUR HAZY (A) 02/10/2021 1357   LABSPEC 1.024 02/10/2021 1357   PHURINE 5.0 02/10/2021 1357   GLUCOSEU NEGATIVE 02/10/2021 1357   HGBUR SMALL (A) 02/10/2021 1357   BILIRUBINUR NEGATIVE 02/10/2021 1357   KETONESUR NEGATIVE 02/10/2021 1357   PROTEINUR >=300 (A) 02/10/2021 1357   NITRITE NEGATIVE 02/10/2021 1357   LEUKOCYTESUR NEGATIVE 02/10/2021 1357   Sepsis Labs Invalid input(s): PROCALCITONIN,  WBC,  LACTICIDVEN Microbiology Recent Results (from the past 240 hour(s))  SARS CORONAVIRUS 2 (TAT 6-24 HRS) Nasopharyngeal Nasopharyngeal Swab     Status: None   Collection Time: 02/10/21  4:21 PM   Specimen: Nasopharyngeal Swab  Result Value Ref Range Status   SARS Coronavirus 2 NEGATIVE NEGATIVE Final    Comment: (NOTE) SARS-CoV-2 target nucleic acids are NOT DETECTED.  The SARS-CoV-2 RNA is generally detectable in upper and lower respiratory specimens during the acute phase of infection. Negative results do not preclude SARS-CoV-2 infection, do not rule out co-infections with other pathogens, and should not be used as the sole basis for treatment or other patient management decisions. Negative results must be combined with clinical observations, patient history, and epidemiological  information. The expected result is Negative.  Fact Sheet for Patients: SugarRoll.be  Fact Sheet for Healthcare Providers: https://www.woods-mathews.com/  This test is not yet approved or cleared by the [place] FDA and  has been authorized for detection and/or diagnosis of SARS-CoV-2 by FDA under an Emergency Use Authorization (EUA). This EUA will remain  in effect (meaning this test can be used) for the duration of the COVID-19 declaration under Se ction 564(b)(1) of the Act, 21 U.S.C. section 360bbb-3(b)(1), unless the authorization is terminated or revoked sooner.  Performed at [facility], [place] [address], [place], [place] [address]   Culture, blood (Routine X 2) w Reflex to ID Panel     Status: None (Preliminary result)   Collection Time: 02/10/21  4:40 PM   Specimen: BLOOD  Result Value Ref Range Status   Specimen Description   Final    BLOOD RIGHT ANTECUBITAL Performed at [facility] [address], [place], [place] [address]    Special Requests   Final    BOTTLES DRAWN AEROBIC ONLY Blood Culture adequate volume Performed at [facility] [address], [place], [place] [address]    Culture   Final    NO GROWTH < 12 HOURS Performed at [facility] [address] [place], [place] [address]    Report Status PENDING  Incomplete  Culture, blood (Routine X 2) w Reflex to ID Panel     Status: None (Preliminary result)   Collection Time: 02/10/21  4:40 PM   Specimen: BLOOD  Result Value Ref Range Status   Specimen Description   Final    BLOOD RIGHT ANTECUBITAL Performed at [facility]  [place] [address] [place], [place] [address]    Special Requests   Final    BOTTLES DRAWN AEROBIC ONLY Blood Culture adequate volume Performed at [facility] [address], [place], [place] [address]    Culture   Final    NO GROWTH < 12  HOURS Performed at [facility] [address] [place], [place] [address]    Report Status PENDING  Incomplete  MRSA PCR Screening     Status: None   Collection Time: 02/10/21 11:48 PM   Specimen: Nasopharyngeal  Result Value Ref Range Status   MRSA by PCR NEGATIVE NEGATIVE Final    Comment:        The GeneXpert MRSA Assay (FDA approved for NASAL specimens only), is one component of a comprehensive MRSA colonization surveillance program. It is not intended to diagnose MRSA infection nor to guide or monitor treatment for MRSA infections. Performed at [facility], [place] [address] [place], [place] [address]      Time coordinating discharge: Over 30 minutes  SIGNED:   Darliss Cheney, MD  [facility] 02/12/2021, 8:12 AM  If 7PM-7AM, please contact night-coverage [email or web address]

## 2021-02-12 NOTE — Evaluation (Addendum)
Physical Therapy Evaluation Patient Details Name: Wendy [name]. [place] MRN: [id number] DOB: 01/19/1946 Today's Date: 02/12/2021   History of Present Illness  Pt admitted with hemoptysis and acute hypoxemic respiratory failure.  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  Clinical Impression  Pt admitted as above and currently demonstrating IND with all basic mobility tasks and up to ambulate 200+ ft in hall including back stepping, side stepping and turning 360 degrees with no noted instability and no LOB.  Pt states "a little tired" but otherwise in good spirits and eager for dc home.  Pt with no further PT needs at this time and PT services will be dc.    Follow Up Recommendations No PT follow up    Equipment Recommendations  None recommended by PT    Recommendations for Other Services       Precautions / Restrictions Precautions Precautions: None Restrictions Weight Bearing Restrictions: No      Mobility  Bed Mobility Overal bed mobility: Modified Independent             General bed mobility comments: no physical assist    Transfers Overall transfer level: Modified independent               General transfer comment: No physical assist  Ambulation/Gait Ambulation/Gait assistance: Supervision;Independent Gait Distance (Feet): 230 Feet Assistive device: None Gait Pattern/deviations: Step-through pattern;Wide base of support Gait velocity: mod pace   General Gait Details: Initial reaching for support items (pt states "just being careful) but not used once cued and no difficulty noted  Stairs            Wheelchair Mobility    Modified [place] (Stroke Patients Only)       Balance Overall balance assessment: Independent                                           Pertinent Vitals/Pain Pain Assessment: No/denies pain    Home Living Family/patient expects to be discharged to:: Private residence Living Arrangements:  Alone Available Help at Discharge: Family;Available PRN/intermittently Type of Home: House Home Access: Stairs to enter Entrance Stairs-Rails: Right Entrance Stairs-Number of Steps: 5 Home Layout: Two level Home Equipment: Crutches      Prior Function Level of Independence: Independent               Hand Dominance        Extremity/Trunk Assessment   Upper Extremity Assessment Upper Extremity Assessment: Overall WFL for tasks assessed    Lower Extremity Assessment Lower Extremity Assessment: Overall WFL for tasks assessed    Cervical / Trunk Assessment Cervical / Trunk Assessment: Normal  Communication   Communication: No difficulties  Cognition Arousal/Alertness: Awake/alert Behavior During Therapy: WFL for tasks assessed/performed Overall Cognitive Status: Within Functional Limits for tasks assessed                                        General Comments      Exercises     Assessment/Plan    PT Assessment Patent does not need any further PT services  PT Problem List         PT Treatment Interventions Gait training;Functional mobility training;Therapeutic activities;Balance training    PT Goals (Current goals can be found  in the Care Plan section)  Acute Rehab PT Goals Patient Stated Goal: HOME ASAP PT Goal Formulation: All assessment and education complete, DC therapy    Frequency Min 1X/week   Barriers to discharge        Co-evaluation               AM-PAC PT "6 Clicks" Mobility  Outcome Measure Help needed turning from your back to your side while in a flat bed without using bedrails?: None Help needed moving from lying on your back to sitting on the side of a flat bed without using bedrails?: None Help needed moving to and from a bed to a chair (including a wheelchair)?: None Help needed standing up from a chair using your arms (e.g., wheelchair or bedside chair)?: None Help needed to walk in hospital room?:  None Help needed climbing 3-5 steps with a railing? : None 6 Click Score: 24    End of Session Equipment Utilized During Treatment: Gait belt Activity Tolerance: Patient tolerated treatment well;Patient limited by fatigue Patient left: in bed;with call bell/phone within reach Nurse Communication: Mobility status PT Visit Diagnosis: Difficulty in walking, not elsewhere classified (R26.2)    Time: 6789-3810 PT Time Calculation (min) (ACUTE ONLY): 17 min   Charges:   PT Evaluation $PT Eval Low Complexity: 1 Low          Arnold Pager [phone] Office [phone]   Donal Lynam 02/12/2021, 9:00 AM

## 2021-02-12 NOTE — Progress Notes (Signed)
Patient's foley catheter removed at 0600. Patient ambulated to bathroom following removal of catheter, but patient unable to void at this time. RN educated patient on the importance of calling for assistance before attempting to get OOB. Patient verbalizes understanding.

## 2021-02-13 ENCOUNTER — Telehealth: Payer: Self-pay

## 2021-02-13 ENCOUNTER — Ambulatory Visit (HOSPITAL_COMMUNITY)
Admission: RE | Admit: 2021-02-13 | Discharge: 2021-02-13 | Disposition: A | Discharge status: Home / Self Care | Payer: Medicare Other | Source: Ambulatory Visit | Attending: Medical | Admitting: Medical

## 2021-02-13 ENCOUNTER — Other Ambulatory Visit: Payer: Self-pay | Admitting: *Deleted

## 2021-02-13 ENCOUNTER — Inpatient Hospital Stay: Payer: Medicare Other

## 2021-02-13 ENCOUNTER — Other Ambulatory Visit: Payer: Self-pay

## 2021-02-13 ENCOUNTER — Telehealth: Payer: Self-pay | Admitting: *Deleted

## 2021-02-13 ENCOUNTER — Inpatient Hospital Stay (HOSPITAL_BASED_OUTPATIENT_CLINIC_OR_DEPARTMENT_OTHER): Payer: Medicare Other | Admitting: Medical

## 2021-02-13 VITALS — BP 160/84 | HR 106 | Temp 98.0°F | Resp 16 | Ht 62.5 in | Wt 130.1 lb

## 2021-02-13 DIAGNOSIS — E8581 Light chain (AL) amyloidosis: Secondary | ICD-10-CM

## 2021-02-13 DIAGNOSIS — C9000 Multiple myeloma not having achieved remission: Secondary | ICD-10-CM | POA: Diagnosis not present

## 2021-02-13 DIAGNOSIS — R1900 Intra-abdominal and pelvic swelling, mass and lump, unspecified site: Secondary | ICD-10-CM | POA: Diagnosis not present

## 2021-02-13 DIAGNOSIS — B965 Pseudomonas (aeruginosa) (mallei) (pseudomallei) as the cause of diseases classified elsewhere: Secondary | ICD-10-CM | POA: Diagnosis not present

## 2021-02-13 DIAGNOSIS — Z90710 Acquired absence of both cervix and uterus: Secondary | ICD-10-CM | POA: Diagnosis not present

## 2021-02-13 DIAGNOSIS — Z5112 Encounter for antineoplastic immunotherapy: Secondary | ICD-10-CM | POA: Diagnosis present

## 2021-02-13 DIAGNOSIS — Z5111 Encounter for antineoplastic chemotherapy: Secondary | ICD-10-CM | POA: Diagnosis present

## 2021-02-13 DIAGNOSIS — Z801 Family history of malignant neoplasm of trachea, bronchus and lung: Secondary | ICD-10-CM | POA: Diagnosis not present

## 2021-02-13 DIAGNOSIS — N390 Urinary tract infection, site not specified: Secondary | ICD-10-CM

## 2021-02-13 DIAGNOSIS — Z79899 Other long term (current) drug therapy: Secondary | ICD-10-CM | POA: Diagnosis not present

## 2021-02-13 DIAGNOSIS — Z87891 Personal history of nicotine dependence: Secondary | ICD-10-CM | POA: Diagnosis not present

## 2021-02-13 LAB — BRAIN NATRIURETIC PEPTIDE: B Natriuretic Peptide: 314.3 pg/mL — ABNORMAL HIGH (ref 0.0–100.0)

## 2021-02-13 LAB — CBC WITH DIFFERENTIAL (CANCER CENTER ONLY)
Abs Immature Granulocytes: 0.03 10*3/uL (ref 0.00–0.07)
Basophils Absolute: 0 10*3/uL (ref 0.0–0.1)
Basophils Relative: 0 %
Eosinophils Absolute: 0 10*3/uL (ref 0.0–0.5)
Eosinophils Relative: 0 %
HCT: 33.8 % — ABNORMAL LOW (ref 36.0–46.0)
Hemoglobin: 11.8 g/dL — ABNORMAL LOW (ref 12.0–15.0)
Immature Granulocytes: 0 %
Lymphocytes Relative: 11 %
Lymphs Abs: 1 10*3/uL (ref 0.7–4.0)
MCH: 33.2 pg (ref 26.0–34.0)
MCHC: 34.9 g/dL (ref 30.0–36.0)
MCV: 95.2 fL (ref 80.0–100.0)
Monocytes Absolute: 0.7 10*3/uL (ref 0.1–1.0)
Monocytes Relative: 8 %
Neutro Abs: 6.9 10*3/uL (ref 1.7–7.7)
Neutrophils Relative %: 81 %
Platelet Count: 325 10*3/uL (ref 150–400)
RBC: 3.55 MIL/uL — ABNORMAL LOW (ref 3.87–5.11)
RDW: 14.2 % (ref 11.5–15.5)
WBC Count: 8.6 10*3/uL (ref 4.0–10.5)
nRBC: 0 % (ref 0.0–0.2)

## 2021-02-13 LAB — URINE CULTURE: Culture: 20000 — AB

## 2021-02-13 LAB — CMP (CANCER CENTER ONLY)
ALT: 14 U/L (ref 0–44)
AST: 20 U/L (ref 15–41)
Albumin: 2.6 g/dL — ABNORMAL LOW (ref 3.5–5.0)
Alkaline Phosphatase: 78 U/L (ref 38–126)
Anion gap: 8 (ref 5–15)
BUN: 17 mg/dL (ref 8–23)
CO2: 27 mmol/L (ref 22–32)
Calcium: 8.3 mg/dL — ABNORMAL LOW (ref 8.9–10.3)
Chloride: 93 mmol/L — ABNORMAL LOW (ref 98–111)
Creatinine: 0.81 mg/dL (ref 0.44–1.00)
GFR, Estimated: >60 mL/min (ref >60)
Glucose, Bld: 121 mg/dL — ABNORMAL HIGH (ref 70–99)
Potassium: 4.3 mmol/L (ref 3.5–5.1)
Sodium: 128 mmol/L — ABNORMAL LOW (ref 135–145)
Total Bilirubin: 0.7 mg/dL (ref 0.3–1.2)
Total Protein: 5.7 g/dL — ABNORMAL LOW (ref 6.5–8.1)

## 2021-02-13 LAB — MULTIPLE MYELOMA PANEL, SERUM
Albumin SerPl Elph-Mcnc: 2.8 g/dL — ABNORMAL LOW (ref 2.9–4.4)
Albumin/Glob SerPl: 1.2 (ref 0.7–1.7)
Alpha 1: 0.3 g/dL (ref 0.0–0.4)
Alpha2 Glob SerPl Elph-Mcnc: 0.9 g/dL (ref 0.4–1.0)
B-Globulin SerPl Elph-Mcnc: 0.9 g/dL (ref 0.7–1.3)
Gamma Glob SerPl Elph-Mcnc: 0.4 g/dL (ref 0.4–1.8)
Globulin, Total: 2.4 g/dL (ref 2.2–3.9)
IgA: 157 mg/dL (ref 64–422)
IgG (Immunoglobin G), Serum: 358 mg/dL — ABNORMAL LOW (ref 586–1602)
IgM (Immunoglobulin M), Srm: 64 mg/dL (ref 26–217)
Total Protein ELP: 5.2 g/dL — ABNORMAL LOW (ref 6.0–8.5)

## 2021-02-13 IMAGING — US US ABDOMEN COMPLETE
1 series · 13 of 25 positions shown · non-contrast
Comparison: None.

CLINICAL DATA: Abdominal pain

EXAM:
ABDOMEN ULTRASOUND COMPLETE

[Series 1: us abdomen complete · 13 of 99 slices shown]
[im 1/99]
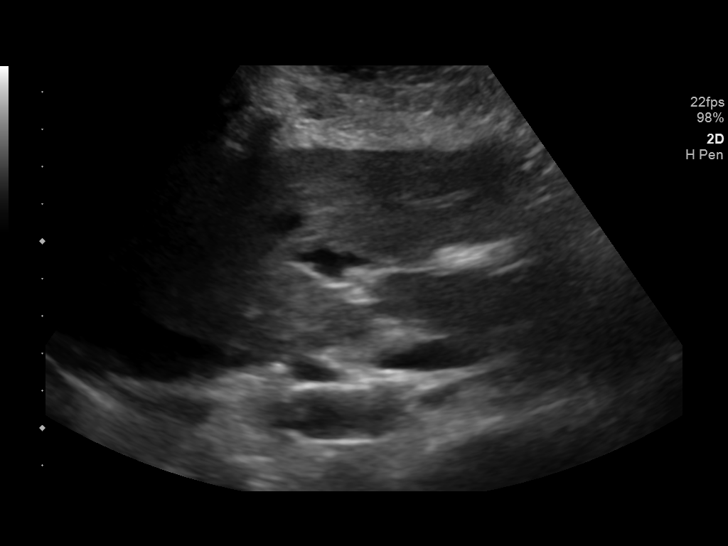
[im 9/99]
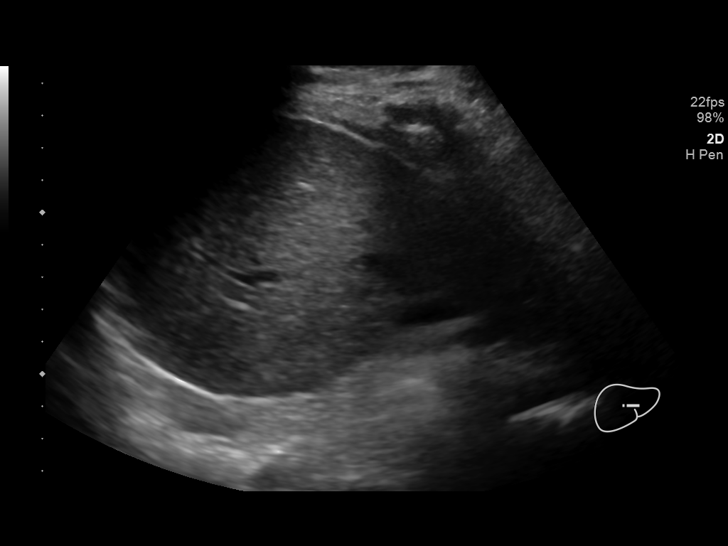
[im 17/99]
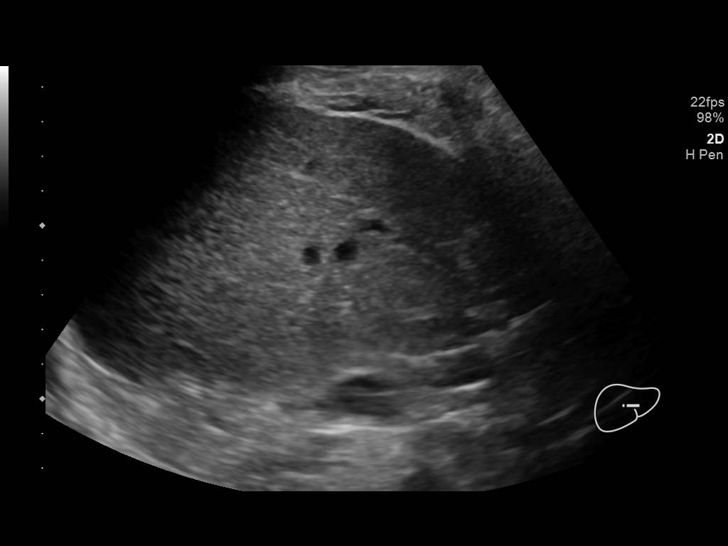
[im 25/99]
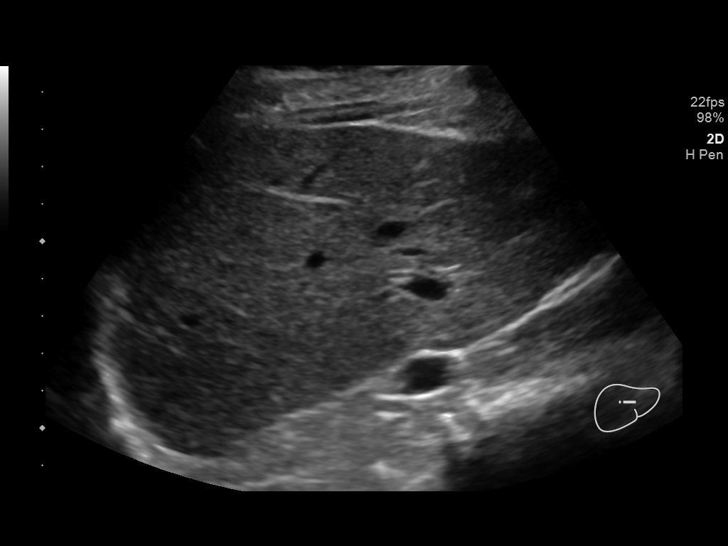
[im 33/99]
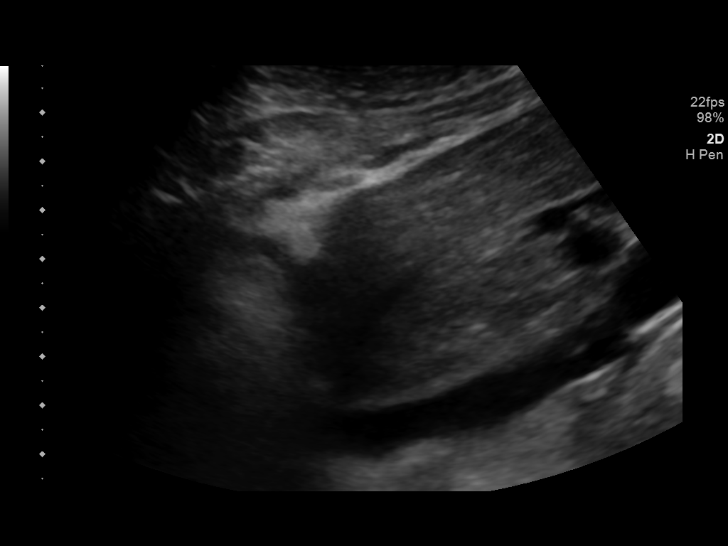
[im 41/99]
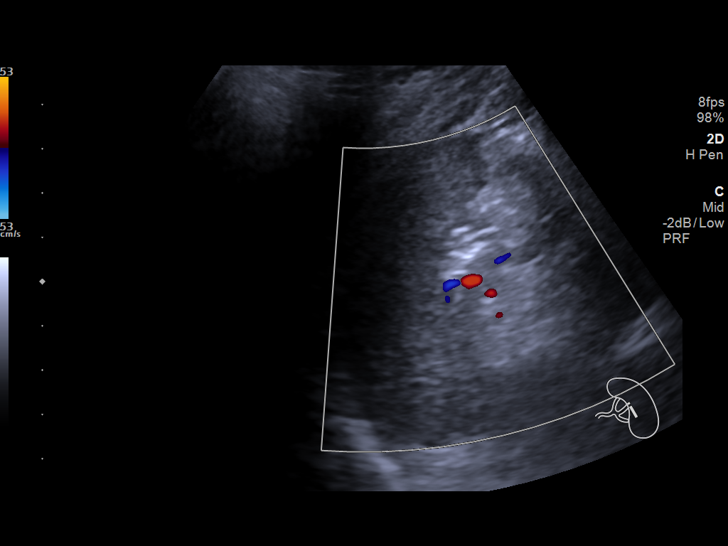
[im 50/99]
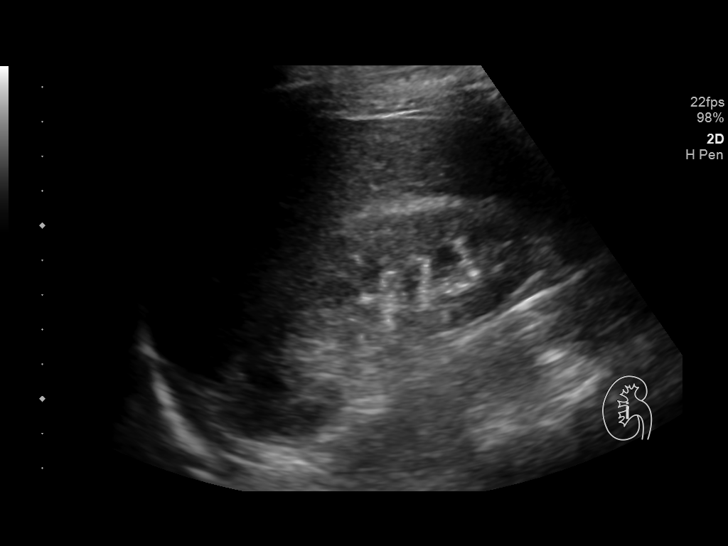
[im 58/99]
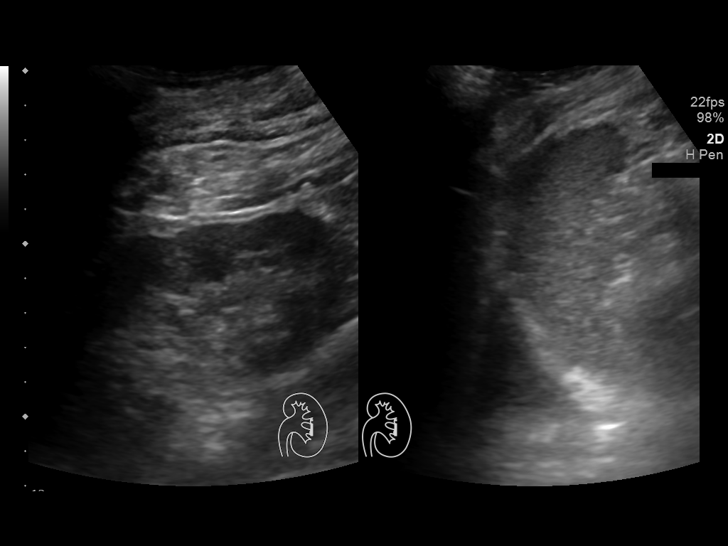
[im 66/99]
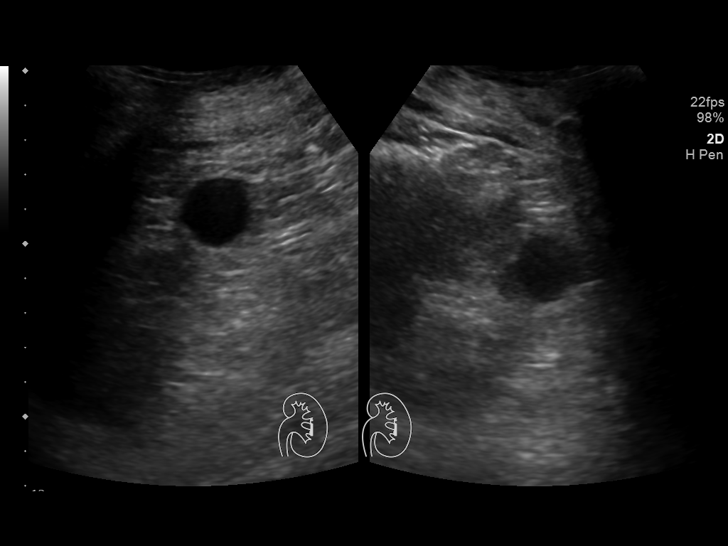
[im 74/99]
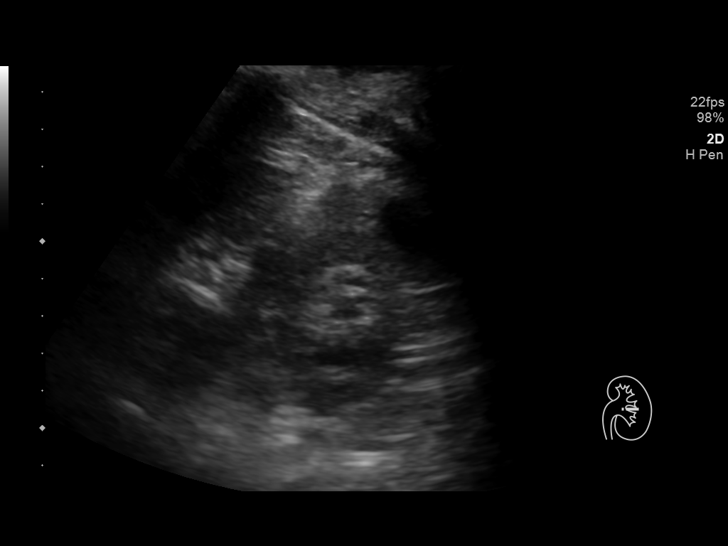
[im 82/99]
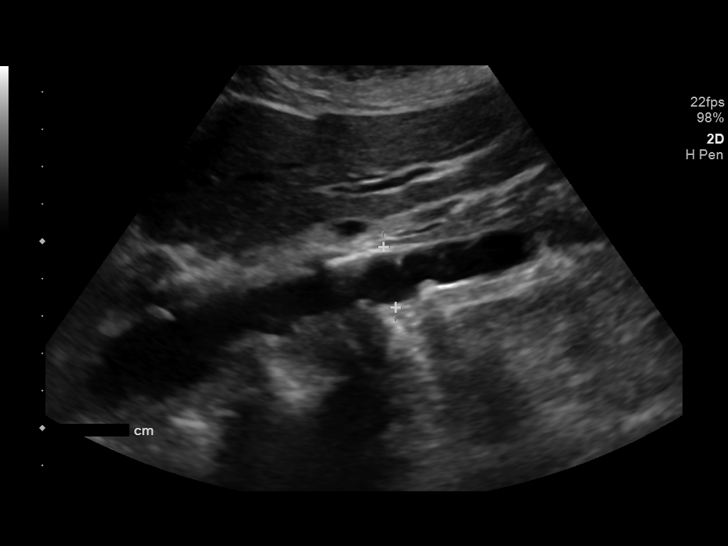
[im 90/99]
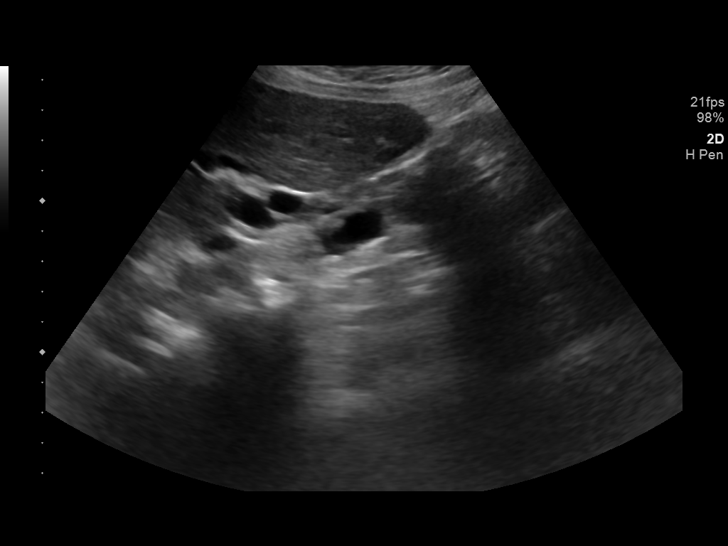
[im 99/99]
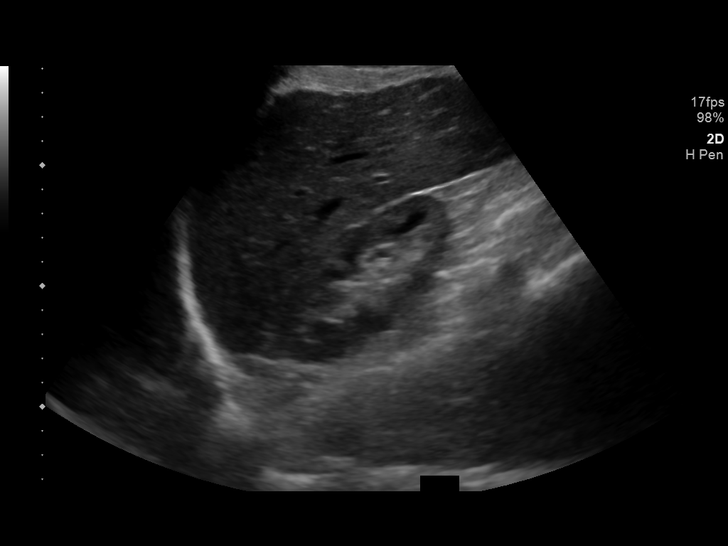

[13 of 25 positions shown; findings below may reference images not displayed]

FINDINGS: Gallbladder: Surgically absent.

Common bile duct: Diameter: 2 mm. No intrahepatic, common hepatic,
or common bile duct dilatation.

Liver: No focal lesion identified. Within normal limits in
parenchymal echogenicity. Portal vein is patent on color Doppler
imaging with normal direction of blood flow towards the liver.

IVC: No abnormality visualized.

Pancreas: Visualized portion unremarkable. Portions of pancreas
obscured by gas.

Spleen: Size and appearance within normal limits.

Right Kidney: Length: 10.3 cm. Echogenicity within normal limits. No
mass or hydronephrosis visualized.

Left Kidney: Length: 10.1 cm. Echogenicity within normal limits. No
hydronephrosis visualized. There is a cyst arising from the mid left
kidney measuring 2.0 x 2.0 x 1.8 cm. A cyst arising more superiorly
measures 1.8 x 1.8 x 1.6 cm.

Abdominal aorta: No aneurysm visualized.

Other findings: No ascites is appreciable. Small pleural effusions
bilaterally are noted.
IMPRESSION: 1.  Gallbladder absent.

2. No evident ascites. There are small pleural effusions noted
bilaterally.

3. Portions of pancreas obscured by gas. Visualized portions of
pancreas appear normal.

4.  Renal cysts on the left.

## 2021-02-13 MED ORDER — CIPROFLOXACIN HCL 500 MG PO TABS: 500.0000 mg | ORAL_TABLET | Freq: Two times a day (BID) | ORAL | 0 refills | Status: DC

## 2021-02-13 NOTE — Progress Notes (Signed)
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 sterile gown and sterile gloves were used for the procedure. Local anesthesia was provided with 1% Lidocaine. Under ultrasound guidance, a 10 gauge, 10 cm length bone biopsy needle was advanced into the subcutaneous fat of the lower abdominal wall. The inner trocar was removed and aspiration of fat performed through the needle while performing suction with a syringe as the needle was moved back and forth in the abdominal wall fat. This maneuver was performed twice in obtaining fat globules that were then placed in formalin. COMPLICATIONS: None FINDINGS: Sufficient abdominal wall fat was obtained. IMPRESSION: Ultrasound-guided abdominal fat pad biopsy. Electronically Signed   By: Aletta Edouard M.D.   On: 01/24/2021 13:42

## 2021-02-13 NOTE — Telephone Encounter (Signed)
Transition Care Management Unsuccessful Follow-up Telephone Call  Date of discharge and from where:  02/12/2021[name]  Attempts:  1st Attempt  Reason for unsuccessful TCM follow-up call:  Left voice message

## 2021-02-13 NOTE — Telephone Encounter (Signed)
Received call from patient stating that she was discharged from the hospital yesterday. She was admitted for hypoxia, was diuresed in the ICU.  Today she states she is noticing fluid accumulation in abd and upper thighs. Discussed with Sandi Mealy, PA as Dr. Lorenso Courier is not in the office today. He advised that she needs to be seen today in [facility] and have Abdominal US. Pt needs to be NPO for 6 hours prior to [place]. Pt scheduled to come here @ 1pm for labs and Sandi Mealy, PA and then [place] after that visit.  Pt voiced understanding

## 2021-02-14 NOTE — Telephone Encounter (Signed)
Transition Care Management Follow-up Telephone Call  Date of discharge and from where: [facility]12/[name]   How have you been since you were released from the hospital? Patient states that she is doing okay   Any questions or concerns? No  Items Reviewed:  Did the pt receive and understand the discharge instructions provided? Yes   Medications obtained and verified? Yes   Other? No   Any new allergies since your discharge? No   Dietary orders reviewed? Yes  Do you have support at home? Yes   Home Care and Equipment/Supplies: Were home health services ordered? not applicable If so, what is the name of the agency? N/A  Has the agency set up a time to come to the patient's home? not applicable Were any new equipment or medical supplies ordered?  No What is the name of the medical supply agency? N/A Were you able to get the supplies/equipment? not applicable Do you have any questions related to the use of the equipment or supplies? No  Functional Questionnaire: (I = Independent and D = Dependent) ADLs: I  Bathing/Dressing- I  Meal Prep- I  Eating- I  Maintaining continence- I  Transferring/Ambulation- I  Managing Meds- I  Follow up appointments reviewed:   PCP Hospital f/u appt confirmed? Yes  Scheduled to see Dr. Glori Bickers on 02/21/2021 @ 12 pm.  [facility] f/u appt confirmed? Yes  Scheduled to see pulmonary on 03/13/2021 @ 2:15 pm.  Are transportation arrangements needed? No   If their condition worsens, is the pt aware to call PCP or go to the Emergency Dept.? Yes  Was the patient provided with contact information for the PCP's office or ED? Yes  Was to pt encouraged to call back with questions or concerns? Yes

## 2021-02-15 ENCOUNTER — Other Ambulatory Visit: Payer: Self-pay | Admitting: Family Medicine

## 2021-02-15 LAB — CULTURE, BLOOD (ROUTINE X 2)
Culture: NO GROWTH
Culture: NO GROWTH
Special Requests: ADEQUATE
Special Requests: ADEQUATE

## 2021-02-15 MED ORDER — ALPRAZOLAM 0.5 MG PO TABS: 0.5000 mg | ORAL_TABLET | Freq: Two times a day (BID) | ORAL | 0 refills | Status: DC | PRN

## 2021-02-15 NOTE — Telephone Encounter (Signed)
Name of Medication: xanax Name of Pharmacy: [facility] [address]  Last Venida Jarvis or Written Date and Quantity: 01/06/21 #20 tabs 0 refills Last Office Visit and Type: 01/11/21 (virtual/ anxiety) Next Office Visit and Type: 02/22/21 Hospital f/u

## 2021-02-15 NOTE — Telephone Encounter (Signed)
Pt called in wanted to knw about getting a refill on alprazolam   Pharmacy: [facility]: [address]

## 2021-02-16 ENCOUNTER — Inpatient Hospital Stay: Payer: Medicare Other

## 2021-02-16 ENCOUNTER — Other Ambulatory Visit: Payer: Self-pay | Admitting: Hematology and Oncology

## 2021-02-16 ENCOUNTER — Encounter: Payer: Self-pay | Admitting: Hematology and Oncology

## 2021-02-16 ENCOUNTER — Inpatient Hospital Stay (HOSPITAL_BASED_OUTPATIENT_CLINIC_OR_DEPARTMENT_OTHER): Payer: Medicare Other | Admitting: Hematology and Oncology

## 2021-02-16 ENCOUNTER — Other Ambulatory Visit: Payer: Self-pay

## 2021-02-16 VITALS — HR 97

## 2021-02-16 VITALS — BP 156/78 | HR 114 | Temp 97.8°F | Resp 18 | Ht 62.0 in | Wt 133.5 lb

## 2021-02-16 DIAGNOSIS — E8581 Light chain (AL) amyloidosis: Secondary | ICD-10-CM | POA: Diagnosis not present

## 2021-02-16 DIAGNOSIS — Z5111 Encounter for antineoplastic chemotherapy: Secondary | ICD-10-CM | POA: Diagnosis not present

## 2021-02-16 LAB — CBC WITH DIFFERENTIAL (CANCER CENTER ONLY)
Abs Immature Granulocytes: 0.04 10*3/uL (ref 0.00–0.07)
Basophils Absolute: 0 10*3/uL (ref 0.0–0.1)
Basophils Relative: 1 %
Eosinophils Absolute: 0.1 10*3/uL (ref 0.0–0.5)
Eosinophils Relative: 2 %
HCT: 31 % — ABNORMAL LOW (ref 36.0–46.0)
Hemoglobin: 11.2 g/dL — ABNORMAL LOW (ref 12.0–15.0)
Immature Granulocytes: 1 %
Lymphocytes Relative: 14 %
Lymphs Abs: 0.9 10*3/uL (ref 0.7–4.0)
MCH: 33.1 pg (ref 26.0–34.0)
MCHC: 36.1 g/dL — ABNORMAL HIGH (ref 30.0–36.0)
MCV: 91.7 fL (ref 80.0–100.0)
Monocytes Absolute: 0.5 10*3/uL (ref 0.1–1.0)
Monocytes Relative: 8 %
Neutro Abs: 4.6 10*3/uL (ref 1.7–7.7)
Neutrophils Relative %: 74 %
Platelet Count: 352 10*3/uL (ref 150–400)
RBC: 3.38 MIL/uL — ABNORMAL LOW (ref 3.87–5.11)
RDW: 13.5 % (ref 11.5–15.5)
WBC Count: 6.2 10*3/uL (ref 4.0–10.5)
nRBC: 0 % (ref 0.0–0.2)

## 2021-02-16 LAB — LACTATE DEHYDROGENASE: LDH: 269 U/L — ABNORMAL HIGH (ref 98–192)

## 2021-02-16 LAB — CMP (CANCER CENTER ONLY)
ALT: 14 U/L (ref 0–44)
AST: 22 U/L (ref 15–41)
Albumin: 2.6 g/dL — ABNORMAL LOW (ref 3.5–5.0)
Alkaline Phosphatase: 74 U/L (ref 38–126)
Anion gap: 10 (ref 5–15)
BUN: 11 mg/dL (ref 8–23)
CO2: 21 mmol/L — ABNORMAL LOW (ref 22–32)
Calcium: 8.1 mg/dL — ABNORMAL LOW (ref 8.9–10.3)
Chloride: 96 mmol/L — ABNORMAL LOW (ref 98–111)
Creatinine: 0.82 mg/dL (ref 0.44–1.00)
GFR, Estimated: >60 mL/min (ref >60)
Glucose, Bld: 134 mg/dL — ABNORMAL HIGH (ref 70–99)
Potassium: 3.6 mmol/L (ref 3.5–5.1)
Sodium: 127 mmol/L — ABNORMAL LOW (ref 135–145)
Total Bilirubin: 0.5 mg/dL (ref 0.3–1.2)
Total Protein: 5.5 g/dL — ABNORMAL LOW (ref 6.5–8.1)

## 2021-02-16 MED ORDER — CYCLOPHOSPHAMIDE CHEMO INJECTION 1 GM
300.0000 mg/m2 | Freq: Once | INTRAMUSCULAR | Status: AC
  Administered 2021-02-16: 480 mg via INTRAVENOUS
  Filled 2021-02-16: qty 24

## 2021-02-16 MED ORDER — DARATUMUMAB-HYALURONIDASE-FIHJ 1800-30000 MG-UT/15ML ~~LOC~~ SOLN
1800.0000 mg | Freq: Once | SUBCUTANEOUS | Status: AC
  Administered 2021-02-16: 1800 mg via SUBCUTANEOUS
  Filled 2021-02-16: qty 15

## 2021-02-16 MED ORDER — DEXAMETHASONE 4 MG PO TABS
ORAL_TABLET | ORAL | Status: AC
  Filled 2021-02-16: qty 10

## 2021-02-16 MED ORDER — MONTELUKAST SODIUM 10 MG PO TABS
10.0000 mg | ORAL_TABLET | Freq: Once | ORAL | Status: AC
  Administered 2021-02-16: 10 mg via ORAL

## 2021-02-16 MED ORDER — DEXAMETHASONE 4 MG PO TABS
20.0000 mg | ORAL_TABLET | Freq: Once | ORAL | Status: AC
  Administered 2021-02-16: 20 mg via ORAL

## 2021-02-16 MED ORDER — MONTELUKAST SODIUM 10 MG PO TABS
ORAL_TABLET | ORAL | Status: AC
  Filled 2021-02-16: qty 1

## 2021-02-16 MED ORDER — ACETAMINOPHEN 325 MG PO TABS
ORAL_TABLET | ORAL | Status: AC
  Filled 2021-02-16: qty 2

## 2021-02-16 MED ORDER — ACETAMINOPHEN 325 MG PO TABS
650.0000 mg | ORAL_TABLET | Freq: Once | ORAL | Status: AC
  Administered 2021-02-16: 650 mg via ORAL

## 2021-02-16 NOTE — Progress Notes (Signed)
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 the needle was moved back and forth in the abdominal wall fat. This maneuver was performed twice in obtaining fat globules that were then placed in formalin. COMPLICATIONS: None FINDINGS: Sufficient abdominal wall fat was obtained. IMPRESSION: Ultrasound-guided abdominal fat pad biopsy. Electronically Signed   By: Aletta Edouard M.D.   On: 01/24/2021 13:42    ASSESSMENT & PLAN Vaughan Basta L. Boss 75 y.o. 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 The patient's last visit was on 01/04/2021. In the interim since the last visit she had a bone marrow biopsy and fat pad biopsy which confirmed the diagnosis of AL amyloidosis.   After review the labs, review of the records, discussion with the patient the findings most consistent with an AL amyloidosis.  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  It is not clear if there are other organ systems with all this time but she has no findings would be concerning for liver or colon involvement.  We will order TTE in order  to effectively rule out cardiac involvement.  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 As such the treatment of choice would be to target his plasma cell population with a triplet or quadruplet therapy. Therapy of choice in this case would consist of daratumumab, Velcade, cyclophosphamide, and dexamethasone. Given the patient's good functional status we will start with full dose Dara-CyBorD. I previously discussed the side effects of this chemotherapy with the patient including neuropathy, elevated blood pressure, drop in blood counts, hypersensitivity reaction, chest tightness, increased infection risk, and fatigue. The patient and family voiced their understanding of these findings and are agreeable to moving forward with quadruple therapy.  The regimen of choice is daratumumab, bortezomib, cyclophosphamide and dexamethasone per the [place]EDA Study ( Blood. 2020 Jul 2;136(1):71-80). Treatment consists of: Cyclophosphamide 300 mg/m2 intravenously and bortezomib 1.3 mg/m2 subcutaneously were given on days 1, 8, 15, and 22 of each 28 day cycle for up to 6 cycles. Dexamethasone 40 mg (starting dose) was given orally or intravenously weekly for each cycle for up to 6 cycles. DARA [place] was administered in a single, premixed vial and given by manual  injection over the course of 3 to 5 minutes weekly in cycles 1 to 2, every 2 weeks in cycles 3 to 6, and every 4 weeks thereafter as monotherapy for a maximum of 2 years.   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  This explains the kidney dysfunction and the lambda light chain predominance. --At this time we know that there is renal involvement, however there is not appear to be any cardiac, colon, or liver involvement at this time.  We will order a TTE in order to assess the cardiac function. --Recommend daratumumab, CyBorD per the  [place] study.  Cycle 1 Day 1 started on 02/09/2021. --due to  respiratory complications on 0/56/7889 will hold Velcade --Given the patient's advanced age she would be considered transplant ineligible --RTC every 2 weeks while on therapy with weekly treatments initially.  #Acute Hypoxic Respiratory Failure --symptoms resolved today with clear lungs and no hypoxia --patient agreeable to continuation of daratumumab/cyclophosphamide with decreased dex. Holding Velcade.   #Supportive Care --chemotherapy education to be scheduled  --zofran 66m q8H PRN and compazine 194mPO q6H for nausea --acyclovir 40032mO BID for VCZ prophylaxis --albuterol for possible bronchospasm with daratumumab --recommend PPI therapy for stomach protection from steroids --Patient declines port at this time -- no pain medication required at this time.    No orders of the defined types were placed in this encounter.   All questions were answered. The patient knows to call the clinic with any problems, questions or concerns.  A total of more than 30 minutes were spent on this encounter and over half of that time was spent on counseling and coordination of care as outlined above.   JohLedell PeoplesD Department of Hematology/Oncology [facility]: [phone]: [phone]: [email or web address]  02/16/2021 8:09 AM

## 2021-02-20 ENCOUNTER — Telehealth: Payer: Self-pay | Admitting: *Deleted

## 2021-02-20 ENCOUNTER — Other Ambulatory Visit: Payer: Self-pay | Admitting: Family Medicine

## 2021-02-20 MED ORDER — FUROSEMIDE 20 MG PO TABS: 20.0000 mg | ORAL_TABLET | Freq: Two times a day (BID) | ORAL | 1 refills | Status: DC

## 2021-02-20 NOTE — Telephone Encounter (Signed)
Received vm message from patient regarding ongoing issues with edema in her legs and lower abdomen. She states she has some Lasix at home and wants to know if she can take it. Discussed this with Dr. Lorenso Courier. He gave the ok for pt to take 20mg  @ 8am and 2pm. Wendy [name] has an appt for labs etc on 02/23/21.  TCT back to pt and advised that it is ok for her to take the lasix 2 x a day @ 8am and 2 pm  Pt voiced understanding . She is aware of her appts this week.

## 2021-02-21 ENCOUNTER — Ambulatory Visit: Payer: Medicare Other | Admitting: Family Medicine

## 2021-02-21 ENCOUNTER — Other Ambulatory Visit: Payer: Self-pay

## 2021-02-21 ENCOUNTER — Ambulatory Visit
Admission: RE | Admit: 2021-02-21 | Discharge: 2021-02-21 | Disposition: A | Discharge status: Home / Self Care | Payer: Medicare Other | Source: Ambulatory Visit | Attending: Family Medicine | Admitting: Family Medicine

## 2021-02-21 DIAGNOSIS — E2839 Other primary ovarian failure: Secondary | ICD-10-CM

## 2021-02-21 NOTE — Telephone Encounter (Signed)
Name of [place] Name of [place] or Written Date and Quantity:10/21/20 #30 tabs 3 refills Last Office Visit and Type:CPE on 10/19/20 Next Office Visit and Type: Hospital f/u 02/22/21 Last Controlled Substance Agreement Date:05/27/13 Last UDS:05/27/13

## 2021-02-22 ENCOUNTER — Other Ambulatory Visit: Payer: Self-pay

## 2021-02-22 ENCOUNTER — Encounter: Payer: Self-pay | Admitting: Family Medicine

## 2021-02-22 ENCOUNTER — Ambulatory Visit (INDEPENDENT_AMBULATORY_CARE_PROVIDER_SITE_OTHER): Payer: Medicare Other | Admitting: Family Medicine

## 2021-02-22 ENCOUNTER — Telehealth: Payer: Self-pay | Admitting: *Deleted

## 2021-02-22 ENCOUNTER — Other Ambulatory Visit: Payer: Self-pay | Admitting: *Deleted

## 2021-02-22 VITALS — BP 126/70 | HR 106 | Temp 96.9°F | Ht 62.0 in | Wt 134.0 lb

## 2021-02-22 DIAGNOSIS — C9000 Multiple myeloma not having achieved remission: Secondary | ICD-10-CM

## 2021-02-22 DIAGNOSIS — M810 Age-related osteoporosis without current pathological fracture: Secondary | ICD-10-CM

## 2021-02-22 DIAGNOSIS — R600 Localized edema: Secondary | ICD-10-CM | POA: Diagnosis not present

## 2021-02-22 DIAGNOSIS — E871 Hypo-osmolality and hyponatremia: Secondary | ICD-10-CM

## 2021-02-22 DIAGNOSIS — R739 Hyperglycemia, unspecified: Secondary | ICD-10-CM

## 2021-02-22 DIAGNOSIS — E8581 Light chain (AL) amyloidosis: Secondary | ICD-10-CM

## 2021-02-22 DIAGNOSIS — I700 Atherosclerosis of aorta: Secondary | ICD-10-CM

## 2021-02-22 DIAGNOSIS — I10 Essential (primary) hypertension: Secondary | ICD-10-CM | POA: Diagnosis not present

## 2021-02-22 MED ORDER — FLUCONAZOLE 150 MG PO TABS: 150.0000 mg | ORAL_TABLET | Freq: Every day | ORAL | 0 refills | Status: DC

## 2021-02-22 NOTE — Assessment & Plan Note (Signed)
bp is stable s/p hosp for hypoxia  Pedal edema continues  Lasix is tolerated well so far and bp has remained at goal BP: 126/70  Continues losartan 100 mg daily  Labs scheduled tomorrow at oncology

## 2021-02-22 NOTE — Assessment & Plan Note (Signed)
No acute aortic syndrome seen on recent CTA chest  Will continue to work on bp and cholesterol control
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
## 2021-02-22 NOTE — Assessment & Plan Note (Signed)
With recent hospitalization for hypoxia and uti  Known light chain amyloidosis and also diastolic dysfunction  Reviewed hospital records, lab results and studies in detail   Will f/u with oncology for visit and labs tomorrow  Also under care of nephrology  Clear lungs today on exam/ pulse ox nl and reviewed echo  Will f/u with cardiology if needed

## 2021-02-22 NOTE — Assessment & Plan Note (Signed)
In the setting of AL amyloidosis seen by oncology and nephrology  On lasix for swelling  Last na 127  Will have re checked at oncology office tomorrow

## 2021-02-22 NOTE — Assessment & Plan Note (Signed)
Some more pedal edema with known proteinuria  On lasix For oncology f/u tomorrow Under nephrology care

## 2021-02-22 NOTE — Telephone Encounter (Signed)
Received call from patient. She states that her legs and feet remain edematous despite Lasix 20 mg BID for the last 3 days.  She is asking if she can increase to 40 mg BID. She also states that she has a vaginal yeast infection after being treated for a UTI.  Discussed the above with Dr. Lorenso Courier. As we are seeing patient tomorrow, he will wait to increase Lasix. He ordered Diflucan for yeast infection. Made pt aware of the above. She voiced understanding.

## 2021-02-22 NOTE — Assessment & Plan Note (Signed)
Continues chemotherapy (one agent held for pneumonitis)  Reviewed hospital records, lab results and studies in detail   Doing better clinically  For oncology f/u with labs tomorrow

## 2021-02-22 NOTE — Assessment & Plan Note (Signed)
She has been on steroids  134 at last check

## 2021-02-22 NOTE — Patient Instructions (Addendum)
Get your labs and oncology follow up tomorrow to make a plan for the swelling  Lungs sound clear  If yeast infection does not improve with diflucan let us know   If you develop any urinary symptoms again let us know

## 2021-02-23 ENCOUNTER — Encounter: Payer: Self-pay | Admitting: Hematology and Oncology

## 2021-02-23 ENCOUNTER — Inpatient Hospital Stay: Payer: Medicare Other | Attending: Hematology and Oncology

## 2021-02-23 ENCOUNTER — Inpatient Hospital Stay (HOSPITAL_BASED_OUTPATIENT_CLINIC_OR_DEPARTMENT_OTHER): Payer: Medicare Other | Admitting: Hematology and Oncology

## 2021-02-23 ENCOUNTER — Other Ambulatory Visit: Payer: Self-pay

## 2021-02-23 ENCOUNTER — Other Ambulatory Visit: Payer: Self-pay | Admitting: Hematology and Oncology

## 2021-02-23 ENCOUNTER — Inpatient Hospital Stay: Payer: Medicare Other

## 2021-02-23 VITALS — BP 157/79 | HR 108 | Temp 98.7°F | Resp 13 | Ht 62.0 in | Wt 133.2 lb

## 2021-02-23 DIAGNOSIS — E8581 Light chain (AL) amyloidosis: Secondary | ICD-10-CM

## 2021-02-23 DIAGNOSIS — Z5111 Encounter for antineoplastic chemotherapy: Secondary | ICD-10-CM | POA: Insufficient documentation

## 2021-02-23 DIAGNOSIS — C9000 Multiple myeloma not having achieved remission: Secondary | ICD-10-CM | POA: Diagnosis not present

## 2021-02-23 DIAGNOSIS — Z5112 Encounter for antineoplastic immunotherapy: Secondary | ICD-10-CM | POA: Diagnosis present

## 2021-02-23 DIAGNOSIS — Z79899 Other long term (current) drug therapy: Secondary | ICD-10-CM | POA: Diagnosis not present

## 2021-02-23 LAB — CMP (CANCER CENTER ONLY)
ALT: 13 U/L (ref 0–44)
AST: 22 U/L (ref 15–41)
Albumin: 2.5 g/dL — ABNORMAL LOW (ref 3.5–5.0)
Alkaline Phosphatase: 76 U/L (ref 38–126)
Anion gap: 9 (ref 5–15)
BUN: 10 mg/dL (ref 8–23)
CO2: 21 mmol/L — ABNORMAL LOW (ref 22–32)
Calcium: 8.4 mg/dL — ABNORMAL LOW (ref 8.9–10.3)
Chloride: 94 mmol/L — ABNORMAL LOW (ref 98–111)
Creatinine: 0.8 mg/dL (ref 0.44–1.00)
GFR, Estimated: >60 mL/min (ref >60)
Glucose, Bld: 115 mg/dL — ABNORMAL HIGH (ref 70–99)
Potassium: 3.8 mmol/L (ref 3.5–5.1)
Sodium: 124 mmol/L — ABNORMAL LOW (ref 135–145)
Total Bilirubin: 0.5 mg/dL (ref 0.3–1.2)
Total Protein: 5.3 g/dL — ABNORMAL LOW (ref 6.5–8.1)

## 2021-02-23 LAB — CBC WITH DIFFERENTIAL (CANCER CENTER ONLY)
Abs Immature Granulocytes: 0.07 10*3/uL (ref 0.00–0.07)
Basophils Absolute: 0 10*3/uL (ref 0.0–0.1)
Basophils Relative: 1 %
Eosinophils Absolute: 0 10*3/uL (ref 0.0–0.5)
Eosinophils Relative: 0 %
HCT: 29.3 % — ABNORMAL LOW (ref 36.0–46.0)
Hemoglobin: 10.8 g/dL — ABNORMAL LOW (ref 12.0–15.0)
Immature Granulocytes: 1 %
Lymphocytes Relative: 8 %
Lymphs Abs: 0.5 10*3/uL — ABNORMAL LOW (ref 0.7–4.0)
MCH: 33.5 pg (ref 26.0–34.0)
MCHC: 36.9 g/dL — ABNORMAL HIGH (ref 30.0–36.0)
MCV: 91 fL (ref 80.0–100.0)
Monocytes Absolute: 0.5 10*3/uL (ref 0.1–1.0)
Monocytes Relative: 9 %
Neutro Abs: 5 10*3/uL (ref 1.7–7.7)
Neutrophils Relative %: 81 %
Platelet Count: 384 10*3/uL (ref 150–400)
RBC: 3.22 MIL/uL — ABNORMAL LOW (ref 3.87–5.11)
RDW: 13.9 % (ref 11.5–15.5)
WBC Count: 6.2 10*3/uL (ref 4.0–10.5)
nRBC: 0 % (ref 0.0–0.2)

## 2021-02-23 LAB — LACTATE DEHYDROGENASE: LDH: 260 U/L — ABNORMAL HIGH (ref 98–192)

## 2021-02-23 MED ORDER — DARATUMUMAB-HYALURONIDASE-FIHJ 1800-30000 MG-UT/15ML ~~LOC~~ SOLN
1800.0000 mg | Freq: Once | SUBCUTANEOUS | Status: AC
  Administered 2021-02-23: 1800 mg via SUBCUTANEOUS
  Filled 2021-02-23: qty 15

## 2021-02-23 MED ORDER — MONTELUKAST SODIUM 10 MG PO TABS
ORAL_TABLET | ORAL | Status: AC
  Filled 2021-02-23: qty 1

## 2021-02-23 MED ORDER — DEXAMETHASONE 4 MG PO TABS
ORAL_TABLET | ORAL | Status: AC
  Filled 2021-02-23: qty 5

## 2021-02-23 MED ORDER — ACETAMINOPHEN 325 MG PO TABS
650.0000 mg | ORAL_TABLET | Freq: Once | ORAL | Status: AC
  Administered 2021-02-23: 650 mg via ORAL

## 2021-02-23 MED ORDER — MONTELUKAST SODIUM 10 MG PO TABS
10.0000 mg | ORAL_TABLET | Freq: Once | ORAL | Status: AC
  Administered 2021-02-23: 10 mg via ORAL

## 2021-02-23 MED ORDER — SODIUM CHLORIDE 0.9 % IV SOLN
300.0000 mg/m2 | Freq: Once | INTRAVENOUS | Status: AC
  Administered 2021-02-23: 480 mg via INTRAVENOUS
  Filled 2021-02-23: qty 24

## 2021-02-23 MED ORDER — SODIUM CHLORIDE 0.9 % IV SOLN
Freq: Once | INTRAVENOUS | Status: AC
  Filled 2021-02-23: qty 250

## 2021-02-23 MED ORDER — ACETAMINOPHEN 325 MG PO TABS
ORAL_TABLET | ORAL | Status: AC
  Filled 2021-02-23: qty 2

## 2021-02-23 MED ORDER — DEXAMETHASONE 4 MG PO TABS
20.0000 mg | ORAL_TABLET | Freq: Once | ORAL | Status: AC
  Administered 2021-02-23: 20 mg via ORAL

## 2021-02-23 NOTE — Progress Notes (Signed)
OK to treat with HR of 106 per Dr. Lorenso Courier

## 2021-02-23 NOTE — Patient Instructions (Signed)
[facility] Discharge Instructions for Patients Receiving Chemotherapy  Today you received the following chemotherapy agents Cyclophosphamide (CYTOXAN),  Daratumumab-hyaluronidase-fihj (DARZALEX FASPRO)  To help prevent nausea and vomiting after your treatment, we encourage you to take your nausea medication as prescribed.   If you develop nausea and vomiting that is not controlled by your nausea medication, call the clinic.   BELOW ARE SYMPTOMS THAT SHOULD BE REPORTED IMMEDIATELY:  *FEVER GREATER THAN 100.5 F  *CHILLS WITH OR WITHOUT FEVER  NAUSEA AND VOMITING THAT IS NOT CONTROLLED WITH YOUR NAUSEA MEDICATION  *UNUSUAL SHORTNESS OF BREATH  *UNUSUAL BRUISING OR BLEEDING  TENDERNESS IN MOUTH AND THROAT WITH OR WITHOUT PRESENCE OF ULCERS  *URINARY PROBLEMS  *BOWEL PROBLEMS  UNUSUAL RASH Items with * indicate a potential emergency and should be followed up as soon as possible.  Feel free to call the clinic should you have any questions or concerns. The clinic phone number is [phone].  Please show the [facility] at check-in to the Emergency Department and triage nurse.

## 2021-02-23 NOTE — Progress Notes (Signed)
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 patient's good functional status we will start with full dose Dara-CyBorD. I previously discussed the side effects of this chemotherapy with the patient including neuropathy, elevated blood pressure, drop in blood counts, hypersensitivity reaction, chest tightness, increased infection risk, and fatigue. The patient and family voiced their understanding of these findings and are agreeable to moving forward with quadruple therapy.  The regimen of choice is daratumumab, bortezomib, cyclophosphamide and dexamethasone per the ANDROMEDA Study ( Blood. 2020 Jul 2;136(1):71-80). Treatment consists of: Cyclophosphamide 300 mg/m2 intravenously and bortezomib 1.3 mg/m2 subcutaneously were given on days 1, 8, 15, and 22 of each 28 day cycle for up to 6 cycles. Dexamethasone 40 mg (starting dose) was given orally or intravenously weekly for each cycle for up to 6 cycles. DARA [place] was administered in a single, premixed vial and given by manual [place] injection over the course of 3 to 5 minutes weekly in cycles 1 to 2, every 2 weeks in cycles 3 to 6, and every 4 weeks  thereafter as monotherapy for a maximum of 2 years.   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  This explains the kidney dysfunction and the lambda light chain predominance. --At this time we know that there is renal involvement, however there is not appear to be any cardiac, colon, or liver involvement at this time.  We will order a TTE in order to assess the cardiac function. --Recommend daratumumab, CyBorD per the [place] study.  Cycle 1 Day 1 started on 02/09/2021. --due to respiratory complications on 2/69/4854 will hold Velcade --Given the patient's advanced age she would be considered transplant ineligible --RTC every 2 weeks while on therapy with weekly treatments initially.  #Acute Hypoxic Respiratory Failure, resolved #Lower Extremity Swelling, stable --symptoms resolved today with clear lungs and no hypoxia --patient agreeable to continuation of daratumumab/cyclophosphamide with decreased dex. Holding Velcade.  --decreased dexamethasone to 45m PO weekly --continue lasix 246mBID. 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   #Supportive Care --chemotherapy education complete --zofran 39m17m8H PRN and compazine 4m42m q6H for nausea --acyclovir 400mg74mBID for VCZ prophylaxis --albuterol for possible bronchospasm with daratumumab --recommend PPI therapy for stomach protection from steroids --Patient declines port at this time -- no pain medication required at this time.   No orders of the defined types were placed in this encounter.  All questions were answered. The patient knows to call the clinic with any problems, questions or concerns.  A total of more than 30 minutes were spent on this encounter and over half of that time was spent on counseling and coordination of care as outlined  above.   Ledell Peoples, MD Department of Hematology/Oncology [place] at [facility] Phone: [phone] Pager: [phone] Email: [email or web address]  02/23/2021 8:13 PM

## 2021-03-02 ENCOUNTER — Other Ambulatory Visit: Payer: Self-pay | Admitting: Hematology and Oncology

## 2021-03-02 ENCOUNTER — Inpatient Hospital Stay: Payer: Medicare Other

## 2021-03-02 ENCOUNTER — Encounter: Payer: Self-pay | Admitting: Hematology and Oncology

## 2021-03-02 ENCOUNTER — Other Ambulatory Visit: Payer: Self-pay

## 2021-03-02 VITALS — BP 122/72 | HR 100 | Temp 98.0°F | Resp 12 | Wt 128.8 lb

## 2021-03-02 DIAGNOSIS — E8581 Light chain (AL) amyloidosis: Secondary | ICD-10-CM

## 2021-03-02 DIAGNOSIS — Z5111 Encounter for antineoplastic chemotherapy: Secondary | ICD-10-CM | POA: Diagnosis not present

## 2021-03-02 LAB — CBC WITH DIFFERENTIAL (CANCER CENTER ONLY)
Abs Immature Granulocytes: 0.06 10*3/uL (ref 0.00–0.07)
Basophils Absolute: 0 10*3/uL (ref 0.0–0.1)
Basophils Relative: 1 %
Eosinophils Absolute: 0 10*3/uL (ref 0.0–0.5)
Eosinophils Relative: 1 %
HCT: 32.4 % — ABNORMAL LOW (ref 36.0–46.0)
Hemoglobin: 11.6 g/dL — ABNORMAL LOW (ref 12.0–15.0)
Immature Granulocytes: 1 %
Lymphocytes Relative: 10 %
Lymphs Abs: 0.4 10*3/uL — ABNORMAL LOW (ref 0.7–4.0)
MCH: 32.6 pg (ref 26.0–34.0)
MCHC: 35.8 g/dL (ref 30.0–36.0)
MCV: 91 fL (ref 80.0–100.0)
Monocytes Absolute: 0.5 10*3/uL (ref 0.1–1.0)
Monocytes Relative: 11 %
Neutro Abs: 3.2 10*3/uL (ref 1.7–7.7)
Neutrophils Relative %: 76 %
Platelet Count: 320 10*3/uL (ref 150–400)
RBC: 3.56 MIL/uL — ABNORMAL LOW (ref 3.87–5.11)
RDW: 13.7 % (ref 11.5–15.5)
WBC Count: 4.2 10*3/uL (ref 4.0–10.5)
nRBC: 0 % (ref 0.0–0.2)

## 2021-03-02 LAB — CMP (CANCER CENTER ONLY)
ALT: 16 U/L (ref 0–44)
AST: 26 U/L (ref 15–41)
Albumin: 2.6 g/dL — ABNORMAL LOW (ref 3.5–5.0)
Alkaline Phosphatase: 87 U/L (ref 38–126)
Anion gap: 10 (ref 5–15)
BUN: 9 mg/dL (ref 8–23)
CO2: 25 mmol/L (ref 22–32)
Calcium: 8.5 mg/dL — ABNORMAL LOW (ref 8.9–10.3)
Chloride: 91 mmol/L — ABNORMAL LOW (ref 98–111)
Creatinine: 0.77 mg/dL (ref 0.44–1.00)
GFR, Estimated: >60 mL/min (ref >60)
Glucose, Bld: 100 mg/dL — ABNORMAL HIGH (ref 70–99)
Potassium: 3.8 mmol/L (ref 3.5–5.1)
Sodium: 126 mmol/L — ABNORMAL LOW (ref 135–145)
Total Bilirubin: 0.4 mg/dL (ref 0.3–1.2)
Total Protein: 5.6 g/dL — ABNORMAL LOW (ref 6.5–8.1)

## 2021-03-02 LAB — LACTATE DEHYDROGENASE: LDH: 272 U/L — ABNORMAL HIGH (ref 98–192)

## 2021-03-02 MED ORDER — DARATUMUMAB-HYALURONIDASE-FIHJ 1800-30000 MG-UT/15ML ~~LOC~~ SOLN
1800.0000 mg | Freq: Once | SUBCUTANEOUS | Status: AC
  Administered 2021-03-02: 1800 mg via SUBCUTANEOUS
  Filled 2021-03-02: qty 15

## 2021-03-02 MED ORDER — DEXAMETHASONE 4 MG PO TABS
20.0000 mg | ORAL_TABLET | Freq: Once | ORAL | Status: AC
  Administered 2021-03-02: 20 mg via ORAL

## 2021-03-02 MED ORDER — SODIUM CHLORIDE 0.9 % IV SOLN
300.0000 mg/m2 | Freq: Once | INTRAVENOUS | Status: AC
  Administered 2021-03-02: 480 mg via INTRAVENOUS
  Filled 2021-03-02: qty 24

## 2021-03-02 MED ORDER — ACETAMINOPHEN 325 MG PO TABS
ORAL_TABLET | ORAL | Status: AC
  Filled 2021-03-02: qty 2

## 2021-03-02 MED ORDER — DEXAMETHASONE 4 MG PO TABS
ORAL_TABLET | ORAL | Status: AC
  Filled 2021-03-02: qty 5

## 2021-03-02 MED ORDER — ACETAMINOPHEN 325 MG PO TABS
650.0000 mg | ORAL_TABLET | Freq: Once | ORAL | Status: AC
  Administered 2021-03-02: 650 mg via ORAL

## 2021-03-02 MED ORDER — SODIUM CHLORIDE 0.9 % IV SOLN
Freq: Once | INTRAVENOUS | Status: AC
  Filled 2021-03-02: qty 250

## 2021-03-02 NOTE — Patient Instructions (Signed)
[facility] Discharge Instructions for Patients Receiving Chemotherapy  Today you received the following chemotherapy agents: Cytoxan and Darzalex Faspro  To help prevent nausea and vomiting after your treatment, we encourage you to take your nausea medication as directed by your MD.   If you develop nausea and vomiting that is not controlled by your nausea medication, call the clinic.   BELOW ARE SYMPTOMS THAT SHOULD BE REPORTED IMMEDIATELY:  *FEVER GREATER THAN 100.5 F  *CHILLS WITH OR WITHOUT FEVER  NAUSEA AND VOMITING THAT IS NOT CONTROLLED WITH YOUR NAUSEA MEDICATION  *UNUSUAL SHORTNESS OF BREATH  *UNUSUAL BRUISING OR BLEEDING  TENDERNESS IN MOUTH AND THROAT WITH OR WITHOUT PRESENCE OF ULCERS  *URINARY PROBLEMS  *BOWEL PROBLEMS  UNUSUAL RASH Items with * indicate a potential emergency and should be followed up as soon as possible.  Feel free to call the clinic should you have any questions or concerns. The clinic phone number is [phone].  Please show the [facility] at check-in to the Emergency Department and triage nurse.

## 2021-03-02 NOTE — Progress Notes (Signed)
Met with patient at registration to introduce myself as Financial Resource Specialist and to offer available resources.  Discussed one-time $1000 [employer] grant and qualifications to assist with personal expenses while going through treatment.  Gave her my card if interested in applying and for any additional financial questions or concerns. 

## 2021-03-03 LAB — KAPPA/LAMBDA LIGHT CHAINS
Kappa free light chain: 8 mg/L (ref 3.3–19.4)
Kappa, lambda light chain ratio: 0.03 — ABNORMAL LOW (ref 0.26–1.65)
Lambda free light chains: 228.7 mg/L — ABNORMAL HIGH (ref 5.7–26.3)

## 2021-03-06 ENCOUNTER — Telehealth: Payer: Self-pay | Admitting: *Deleted

## 2021-03-06 ENCOUNTER — Telehealth: Payer: Self-pay

## 2021-03-06 ENCOUNTER — Other Ambulatory Visit: Payer: Self-pay | Admitting: *Deleted

## 2021-03-06 LAB — MULTIPLE MYELOMA PANEL, SERUM
Albumin SerPl Elph-Mcnc: 2.6 g/dL — ABNORMAL LOW (ref 2.9–4.4)
Albumin/Glob SerPl: 1.1 (ref 0.7–1.7)
Alpha 1: 0.3 g/dL (ref 0.0–0.4)
Alpha2 Glob SerPl Elph-Mcnc: 1 g/dL (ref 0.4–1.0)
B-Globulin SerPl Elph-Mcnc: 0.8 g/dL (ref 0.7–1.3)
Gamma Glob SerPl Elph-Mcnc: 0.3 g/dL — ABNORMAL LOW (ref 0.4–1.8)
Globulin, Total: 2.5 g/dL (ref 2.2–3.9)
IgA: 81 mg/dL (ref 64–422)
IgG (Immunoglobin G), Serum: 353 mg/dL — ABNORMAL LOW (ref 586–1602)
IgM (Immunoglobulin M), Srm: 40 mg/dL (ref 26–217)
M Protein SerPl Elph-Mcnc: 0.2 g/dL — ABNORMAL HIGH
Total Protein ELP: 5.1 g/dL — ABNORMAL LOW (ref 6.0–8.5)

## 2021-03-06 NOTE — Telephone Encounter (Signed)
Received call from patient. She sates she has started retaining fluid again despite change in diuretic from her Nephrologist, Dr. Jannifer Hick.  Pt was taking Furosemide and is now on bumetanide. She took 2 doses of K+ on Sunday only. She was not instructed to take any more.  Pt states that her legs are swollen again and her muscles feel very tight and fatigued. She has no energy to even move about in her house.  She is requesting a visit today if possible, along with recommendations of what to do next.

## 2021-03-06 NOTE — Telephone Encounter (Signed)
Benefit verification request submitted-pending decision Last injection was on 11/02/2020 Next injection after 05/03/2021

## 2021-03-06 NOTE — Telephone Encounter (Signed)
Call back to patient. Advised that she had an appt on Thursday of this week-03/09/21. Pt states she will be ok until then. She did report some diarrhea over the weekend but took imodium with resolution of her symptoms. Advised to call if further problems arise.

## 2021-03-08 ENCOUNTER — Encounter (HOSPITAL_COMMUNITY): Payer: Self-pay

## 2021-03-08 ENCOUNTER — Telehealth: Payer: Self-pay | Admitting: *Deleted

## 2021-03-08 ENCOUNTER — Other Ambulatory Visit: Payer: Self-pay

## 2021-03-08 ENCOUNTER — Emergency Department (HOSPITAL_COMMUNITY)
Admission: EM | Admit: 2021-03-08 | Discharge: 2021-03-09 | Disposition: A | Discharge status: Home / Self Care | Payer: Medicare Other | Attending: Emergency Medicine | Admitting: Emergency Medicine

## 2021-03-08 DIAGNOSIS — R1030 Lower abdominal pain, unspecified: Secondary | ICD-10-CM | POA: Insufficient documentation

## 2021-03-08 DIAGNOSIS — C9000 Multiple myeloma not having achieved remission: Secondary | ICD-10-CM | POA: Diagnosis not present

## 2021-03-08 DIAGNOSIS — R39198 Other difficulties with micturition: Secondary | ICD-10-CM

## 2021-03-08 DIAGNOSIS — R339 Retention of urine, unspecified: Secondary | ICD-10-CM | POA: Diagnosis present

## 2021-03-08 DIAGNOSIS — R110 Nausea: Secondary | ICD-10-CM | POA: Insufficient documentation

## 2021-03-08 DIAGNOSIS — Z79899 Other long term (current) drug therapy: Secondary | ICD-10-CM | POA: Diagnosis not present

## 2021-03-08 DIAGNOSIS — I10 Essential (primary) hypertension: Secondary | ICD-10-CM | POA: Diagnosis not present

## 2021-03-08 LAB — CBC WITH DIFFERENTIAL/PLATELET
Abs Immature Granulocytes: 0.12 10*3/uL — ABNORMAL HIGH (ref 0.00–0.07)
Basophils Absolute: 0 10*3/uL (ref 0.0–0.1)
Basophils Relative: 1 %
Eosinophils Absolute: 0 10*3/uL (ref 0.0–0.5)
Eosinophils Relative: 0 %
HCT: 27.7 % — ABNORMAL LOW (ref 36.0–46.0)
Hemoglobin: 9.9 g/dL — ABNORMAL LOW (ref 12.0–15.0)
Immature Granulocytes: 2 %
Lymphocytes Relative: 6 %
Lymphs Abs: 0.4 10*3/uL — ABNORMAL LOW (ref 0.7–4.0)
MCH: 33.9 pg (ref 26.0–34.0)
MCHC: 35.7 g/dL (ref 30.0–36.0)
MCV: 94.9 fL (ref 80.0–100.0)
Monocytes Absolute: 0.5 10*3/uL (ref 0.1–1.0)
Monocytes Relative: 9 %
Neutro Abs: 5.2 10*3/uL (ref 1.7–7.7)
Neutrophils Relative %: 82 %
Platelets: 234 10*3/uL (ref 150–400)
RBC: 2.92 MIL/uL — ABNORMAL LOW (ref 3.87–5.11)
RDW: 13.7 % (ref 11.5–15.5)
WBC: 6.2 10*3/uL (ref 4.0–10.5)
nRBC: 0 % (ref 0.0–0.2)

## 2021-03-08 LAB — BASIC METABOLIC PANEL
Anion gap: 8 (ref 5–15)
BUN: 24 mg/dL — ABNORMAL HIGH (ref 8–23)
CO2: 28 mmol/L (ref 22–32)
Calcium: 9.2 mg/dL (ref 8.9–10.3)
Chloride: 90 mmol/L — ABNORMAL LOW (ref 98–111)
Creatinine, Ser: 1.09 mg/dL — ABNORMAL HIGH (ref 0.44–1.00)
GFR, Estimated: 53 mL/min — ABNORMAL LOW (ref >60)
Glucose, Bld: 123 mg/dL — ABNORMAL HIGH (ref 70–99)
Potassium: 4.5 mmol/L (ref 3.5–5.1)
Sodium: 126 mmol/L — ABNORMAL LOW (ref 135–145)

## 2021-03-08 LAB — URINALYSIS, ROUTINE W REFLEX MICROSCOPIC
Bacteria, UA: NONE SEEN
Bilirubin Urine: NEGATIVE
Glucose, UA: NEGATIVE mg/dL
Ketones, ur: NEGATIVE mg/dL
Leukocytes,Ua: NEGATIVE
Nitrite: NEGATIVE
Protein, ur: 100 mg/dL — AB
Specific Gravity, Urine: 1.008 (ref 1.005–1.030)
pH: 6 (ref 5.0–8.0)

## 2021-03-08 NOTE — Telephone Encounter (Signed)
See previous entry

## 2021-03-08 NOTE — ED Provider Notes (Signed)
  Face-to-face evaluation   History: Patient presenting with decreased urination following recent chemotherapy, common occurrence, following treatment for multiple myeloma.  She has had for episodes of chemotherapy.  She reports being constipated over the weekend and having multiple bowel movements after which she took Imodium with cessation of the loose bowel movements.  She still feels distended in her upper abdomen. She denies nausea, vomiting or fever.  Physical exam: Elderly lady alert and cooperative.  Abdomen slightly distended, but nontender to palpation.  No dysarthria or aphasia.  She is lucid.   Medical screening examination/treatment/procedure(s) were conducted as a shared visit with non-physician practitioner(s) and myself.  I personally evaluated the patient during the encounter    Daleen Bo, MD 03/09/21 1120

## 2021-03-08 NOTE — Telephone Encounter (Signed)
Received call from patient.  She states she started feeling worse this afternoon as compared to this morning. She is not passing much urine, she is not able to drink much fluid as she feels very bloated. She c/o back pain and just generally miserable. Advised that she should go to the [facility] ED now as our lab is closed and the clinics will be closing too. Pt voiced understanding. She is agreeable to this plan.  She will call her daughter to take her to the ED.  Dr. Lorenso Courier made aware.
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
## 2021-03-08 NOTE — ED Triage Notes (Signed)
Pt c/o weakness and urinary retention since last chemo treatment last Thursday. Hx of multiple myeloma. States when she tries to urinate only a small amt comes out, feels like bladder is full. States this happens frequently after chemo treatments. Denies n/v/d

## 2021-03-08 NOTE — Discharge Instructions (Addendum)
You were seen in the emergency department today for some difficulty urinating.  Your labs were overall reassuring, your kidney function was mildly increased from prior and your sodium was similar to prior blood work you have had done.  Your urine did not show signs of infection.  Please be sure to stay well-hydrated.   Follow-up with your primary care provider, call the office tomorrow to schedule an appointment as soon as possible, also be sure to have your blood work rechecked.  Also make your oncologist aware of your visit today.  Return to the ER for new or worsening symptoms including but not limited to inability to urinate, fever, vomiting, abdominal pain, chest pain, shortness of breath, abdominal pain, burning with urination, blood in your urine, or any other concerns.

## 2021-03-08 NOTE — ED Provider Notes (Signed)
22:30: Assumed care of patient from NP Etta Quill at change of shift pending urinalysis results with plan for discharge home.  Please see provider note for full H&P. Briefly patient is a 75 year old female who presented to the emergency department with some difficulty urinating after her most recent chemotherapy session, this is happened previously.  Her blood work today does show hyponatremia that is similar to prior, mildly worsening renal function.  Bladder scan with only 120 cc present, did not appear to be acutely retaining, was able to provide urine sample.  Plan at change of shift is to follow-up on urinalysis, if findings of infection discharge home with antibiotics, otherwise can be discharged home with follow-up with her outpatient providers.  Urinalysis with small amount of hemoglobin similar to prior.  No acute UTI.  On my evaluation patient is feeling okay and ready to go home. I discussed results, treatment plan, need for follow-up, and return precautions with the patient. Provided opportunity for questions, patient confirmed understanding and is in agreement with plan.     Wendy [name] 03/08/21 2343    Daleen Bo, MD 03/09/21 1121

## 2021-03-08 NOTE — Telephone Encounter (Signed)
Received call from patient. She states that she continues to feel very fatigued and has ongoing edema in her legs. She states she was able to take a shower and get down the stairs this morning, but is then too tired to do much. Advised that she has an appt tomorrow for labs and Dr. Lorenso Courier first thing in the morning. She is agreeable to wait until tomorrow.  Dr. Lorenso Courier aware.

## 2021-03-09 ENCOUNTER — Inpatient Hospital Stay: Payer: Medicare Other

## 2021-03-09 ENCOUNTER — Inpatient Hospital Stay (HOSPITAL_BASED_OUTPATIENT_CLINIC_OR_DEPARTMENT_OTHER): Payer: Medicare Other | Admitting: Hematology and Oncology

## 2021-03-09 VITALS — BP 112/62 | HR 100 | Temp 99.0°F | Resp 16 | Ht 62.0 in | Wt 131.9 lb

## 2021-03-09 DIAGNOSIS — E8581 Light chain (AL) amyloidosis: Secondary | ICD-10-CM

## 2021-03-09 DIAGNOSIS — Z5111 Encounter for antineoplastic chemotherapy: Secondary | ICD-10-CM | POA: Diagnosis not present

## 2021-03-09 DIAGNOSIS — R803 Bence Jones proteinuria: Secondary | ICD-10-CM | POA: Diagnosis not present

## 2021-03-09 LAB — CMP (CANCER CENTER ONLY)
ALT: 21 U/L (ref 0–44)
AST: 27 U/L (ref 15–41)
Albumin: 2.6 g/dL — ABNORMAL LOW (ref 3.5–5.0)
Alkaline Phosphatase: 90 U/L (ref 38–126)
Anion gap: 11 (ref 5–15)
BUN: 21 mg/dL (ref 8–23)
CO2: 26 mmol/L (ref 22–32)
Calcium: 9.2 mg/dL (ref 8.9–10.3)
Chloride: 90 mmol/L — ABNORMAL LOW (ref 98–111)
Creatinine: 1.15 mg/dL — ABNORMAL HIGH (ref 0.44–1.00)
GFR, Estimated: 49 mL/min — ABNORMAL LOW (ref >60)
Glucose, Bld: 110 mg/dL — ABNORMAL HIGH (ref 70–99)
Potassium: 4.4 mmol/L (ref 3.5–5.1)
Sodium: 127 mmol/L — ABNORMAL LOW (ref 135–145)
Total Bilirubin: 0.6 mg/dL (ref 0.3–1.2)
Total Protein: 6 g/dL — ABNORMAL LOW (ref 6.5–8.1)

## 2021-03-09 LAB — CBC WITH DIFFERENTIAL (CANCER CENTER ONLY)
Abs Immature Granulocytes: 0.08 10*3/uL — ABNORMAL HIGH (ref 0.00–0.07)
Basophils Absolute: 0 10*3/uL (ref 0.0–0.1)
Basophils Relative: 1 %
Eosinophils Absolute: 0 10*3/uL (ref 0.0–0.5)
Eosinophils Relative: 0 %
HCT: 29.3 % — ABNORMAL LOW (ref 36.0–46.0)
Hemoglobin: 10.6 g/dL — ABNORMAL LOW (ref 12.0–15.0)
Immature Granulocytes: 1 %
Lymphocytes Relative: 6 %
Lymphs Abs: 0.3 10*3/uL — ABNORMAL LOW (ref 0.7–4.0)
MCH: 33.4 pg (ref 26.0–34.0)
MCHC: 36.2 g/dL — ABNORMAL HIGH (ref 30.0–36.0)
MCV: 92.4 fL (ref 80.0–100.0)
Monocytes Absolute: 0.5 10*3/uL (ref 0.1–1.0)
Monocytes Relative: 9 %
Neutro Abs: 4.8 10*3/uL (ref 1.7–7.7)
Neutrophils Relative %: 83 %
Platelet Count: 252 10*3/uL (ref 150–400)
RBC: 3.17 MIL/uL — ABNORMAL LOW (ref 3.87–5.11)
RDW: 13.7 % (ref 11.5–15.5)
WBC Count: 5.8 10*3/uL (ref 4.0–10.5)
nRBC: 0 % (ref 0.0–0.2)

## 2021-03-09 LAB — LACTATE DEHYDROGENASE: LDH: 281 U/L — ABNORMAL HIGH (ref 98–192)

## 2021-03-09 MED ORDER — ACETAMINOPHEN 325 MG PO TABS
ORAL_TABLET | ORAL | Status: AC
  Filled 2021-03-09: qty 2

## 2021-03-09 MED ORDER — DEXAMETHASONE 4 MG PO TABS
10.0000 mg | ORAL_TABLET | Freq: Once | ORAL | Status: AC
  Administered 2021-03-09: 10 mg via ORAL

## 2021-03-09 MED ORDER — SODIUM CHLORIDE 0.9 % IV SOLN
Freq: Once | INTRAVENOUS | Status: AC
  Filled 2021-03-09: qty 250

## 2021-03-09 MED ORDER — DARATUMUMAB-HYALURONIDASE-FIHJ 1800-30000 MG-UT/15ML ~~LOC~~ SOLN
1800.0000 mg | Freq: Once | SUBCUTANEOUS | Status: AC
  Administered 2021-03-09: 1800 mg via SUBCUTANEOUS
  Filled 2021-03-09: qty 15

## 2021-03-09 MED ORDER — DEXAMETHASONE 4 MG PO TABS
ORAL_TABLET | ORAL | Status: AC
  Filled 2021-03-09: qty 3

## 2021-03-09 MED ORDER — ACETAMINOPHEN 325 MG PO TABS
650.0000 mg | ORAL_TABLET | Freq: Once | ORAL | Status: AC
  Administered 2021-03-09: 650 mg via ORAL

## 2021-03-09 MED ORDER — SODIUM CHLORIDE 0.9 % IV SOLN
300.0000 mg/m2 | Freq: Once | INTRAVENOUS | Status: AC
  Administered 2021-03-09: 480 mg via INTRAVENOUS
  Filled 2021-03-09: qty 24

## 2021-03-09 NOTE — Patient Instructions (Signed)
[facility] Discharge Instructions for Patients Receiving Chemotherapy  Today you received the following chemotherapy agents: Cytoxan (cyclophosphamide)/Darzalex Faspro.  To help prevent nausea and vomiting after your treatment, we encourage you to take your nausea medication as directed.   If you develop nausea and vomiting that is not controlled by your nausea medication, call the clinic.   BELOW ARE SYMPTOMS THAT SHOULD BE REPORTED IMMEDIATELY:  *FEVER GREATER THAN 100.5 F  *CHILLS WITH OR WITHOUT FEVER  NAUSEA AND VOMITING THAT IS NOT CONTROLLED WITH YOUR NAUSEA MEDICATION  *UNUSUAL SHORTNESS OF BREATH  *UNUSUAL BRUISING OR BLEEDING  TENDERNESS IN MOUTH AND THROAT WITH OR WITHOUT PRESENCE OF ULCERS  *URINARY PROBLEMS  *BOWEL PROBLEMS  UNUSUAL RASH Items with * indicate a potential emergency and should be followed up as soon as possible.  Feel free to call the clinic should you have any questions or concerns. The clinic phone number is [phone].  Please show the [facility] at check-in to the Emergency Department and triage nurse.

## 2021-03-09 NOTE — Progress Notes (Signed)
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 involvement.  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 As such the treatment of choice would be to target his plasma cell population with a triplet or quadruplet therapy. Therapy of choice in this case would consist of daratumumab, Velcade, cyclophosphamide, and dexamethasone. Given the patient's good functional status we will start with full dose Dara-CyBorD. I previously discussed the side effects of this chemotherapy with the patient including neuropathy, elevated blood pressure, drop in blood counts, hypersensitivity reaction, chest tightness, increased infection risk, and fatigue. The patient and family voiced their understanding of these findings and are agreeable to moving forward with quadruple therapy.  The regimen of choice is daratumumab, bortezomib, cyclophosphamide and dexamethasone per the [facility]ROMEDA Study ( Blood. 2020 Jul 2;136(1):71-80). Treatment consists of: Cyclophosphamide 300 mg/m2 intravenously and bortezomib 1.3 mg/m2 subcutaneously were given on days 1, 8, 15, and 22 of each 28 day cycle for up to 6 cycles. Dexamethasone 40 mg  (starting dose) was given orally or intravenously weekly for each cycle for up to 6 cycles. DARA [place] was administered in a single, premixed vial and given by manual [place] injection over the course of 3 to 5 minutes weekly in cycles 1 to 2, every 2 weeks in cycles 3 to 6, and every 4 weeks thereafter as monotherapy for a maximum of 2 years.   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  This explains the kidney dysfunction and the lambda light chain predominance. --At this time we know that there is renal involvement, however there is not appear to be any cardiac, colon, or liver involvement at this time.  We ordered a TTE which showed normal EF and Grade I diastolic dysfunction.  --Recommend daratumumab, CyBorD per the [place] study.  Cycle 1 Day 1 started on 02/09/2021. --due to respiratory complications on 0/34/7425 will hold Velcade --Given the patient's advanced age she would be considered transplant ineligible --RTC every 2 weeks while on therapy with weekly treatments initially.  #Acute Hypoxic Respiratory Failure, resolved #Lower Extremity Swelling, stable --symptoms resolved today with clear lungs and no hypoxia --patient agreeable to continuation of daratumumab/cyclophosphamide with decreased dex. Holding Velcade.  --further decreased dexamethasone to 10mg  PO weekly --continue Bumex per nephrology. 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   #Supportive Care --chemotherapy education complete --zofran 8mg  q8H PRN and compazine 10mg  PO q6H for nausea --acyclovir 400mg  PO BID for VCZ prophylaxis --albuterol for possible bronchospasm with daratumumab --recommend PPI therapy for stomach protection from steroids --Patient declines port at this time -- no pain medication required at this time.   No orders  of the defined types were placed in this encounter.  All questions were answered. The patient knows to call the  clinic with any problems, questions or concerns.  A total of more than 30 minutes were spent on this encounter and over half of that time was spent on counseling and coordination of care as outlined above.   Ledell Peoples, MD Department of Hematology/Oncology [place] [facility] Phone: [phone] Pager: [phone] Email: [email or web address]  03/10/2021 1:21 PM

## 2021-03-10 ENCOUNTER — Encounter: Payer: Self-pay | Admitting: Hematology and Oncology

## 2021-03-13 ENCOUNTER — Inpatient Hospital Stay (HOSPITAL_COMMUNITY): Payer: Medicare Other

## 2021-03-13 ENCOUNTER — Emergency Department (HOSPITAL_COMMUNITY): Payer: Medicare Other

## 2021-03-13 ENCOUNTER — Encounter (HOSPITAL_COMMUNITY): Payer: Self-pay

## 2021-03-13 ENCOUNTER — Inpatient Hospital Stay: Payer: Medicare Other | Admitting: Pulmonary Disease

## 2021-03-13 ENCOUNTER — Telehealth: Payer: Self-pay | Admitting: Hematology and Oncology

## 2021-03-13 ENCOUNTER — Inpatient Hospital Stay (HOSPITAL_COMMUNITY)
Admission: EM | Admit: 2021-03-13 | Discharge: 2021-03-20 | DRG: 271 | Disposition: A | Discharge status: Home Health | Payer: Medicare Other | Attending: Internal Medicine | Admitting: Internal Medicine

## 2021-03-13 ENCOUNTER — Other Ambulatory Visit: Payer: Self-pay

## 2021-03-13 DIAGNOSIS — E871 Hypo-osmolality and hyponatremia: Secondary | ICD-10-CM | POA: Diagnosis present

## 2021-03-13 DIAGNOSIS — M795 Residual foreign body in soft tissue: Secondary | ICD-10-CM

## 2021-03-13 DIAGNOSIS — N179 Acute kidney failure, unspecified: Secondary | ICD-10-CM | POA: Diagnosis present

## 2021-03-13 DIAGNOSIS — R531 Weakness: Secondary | ICD-10-CM

## 2021-03-13 DIAGNOSIS — R609 Edema, unspecified: Secondary | ICD-10-CM | POA: Diagnosis not present

## 2021-03-13 DIAGNOSIS — M7989 Other specified soft tissue disorders: Secondary | ICD-10-CM | POA: Diagnosis present

## 2021-03-13 DIAGNOSIS — Z20822 Contact with and (suspected) exposure to covid-19: Secondary | ICD-10-CM | POA: Diagnosis present

## 2021-03-13 DIAGNOSIS — E8581 Light chain (AL) amyloidosis: Secondary | ICD-10-CM | POA: Diagnosis present

## 2021-03-13 DIAGNOSIS — Z888 Allergy status to other drugs, medicaments and biological substances status: Secondary | ICD-10-CM

## 2021-03-13 DIAGNOSIS — I82401 Acute embolism and thrombosis of unspecified deep veins of right lower extremity: Secondary | ICD-10-CM | POA: Diagnosis not present

## 2021-03-13 DIAGNOSIS — Z79899 Other long term (current) drug therapy: Secondary | ICD-10-CM | POA: Diagnosis not present

## 2021-03-13 DIAGNOSIS — I824Y2 Acute embolism and thrombosis of unspecified deep veins of left proximal lower extremity: Secondary | ICD-10-CM

## 2021-03-13 DIAGNOSIS — D638 Anemia in other chronic diseases classified elsewhere: Secondary | ICD-10-CM | POA: Diagnosis present

## 2021-03-13 DIAGNOSIS — I824Z3 Acute embolism and thrombosis of unspecified deep veins of distal lower extremity, bilateral: Secondary | ICD-10-CM | POA: Diagnosis not present

## 2021-03-13 DIAGNOSIS — D6859 Other primary thrombophilia: Secondary | ICD-10-CM | POA: Diagnosis present

## 2021-03-13 DIAGNOSIS — M4854XA Collapsed vertebra, not elsewhere classified, thoracic region, initial encounter for fracture: Secondary | ICD-10-CM | POA: Diagnosis present

## 2021-03-13 DIAGNOSIS — Z86718 Personal history of other venous thrombosis and embolism: Secondary | ICD-10-CM | POA: Diagnosis present

## 2021-03-13 DIAGNOSIS — Z801 Family history of malignant neoplasm of trachea, bronchus and lung: Secondary | ICD-10-CM | POA: Diagnosis not present

## 2021-03-13 DIAGNOSIS — I82403 Acute embolism and thrombosis of unspecified deep veins of lower extremity, bilateral: Secondary | ICD-10-CM | POA: Diagnosis present

## 2021-03-13 DIAGNOSIS — C9000 Multiple myeloma not having achieved remission: Secondary | ICD-10-CM | POA: Diagnosis present

## 2021-03-13 DIAGNOSIS — Z87891 Personal history of nicotine dependence: Secondary | ICD-10-CM | POA: Diagnosis not present

## 2021-03-13 DIAGNOSIS — H9190 Unspecified hearing loss, unspecified ear: Secondary | ICD-10-CM | POA: Diagnosis present

## 2021-03-13 DIAGNOSIS — Z882 Allergy status to sulfonamides status: Secondary | ICD-10-CM

## 2021-03-13 DIAGNOSIS — E7800 Pure hypercholesterolemia, unspecified: Secondary | ICD-10-CM | POA: Diagnosis not present

## 2021-03-13 DIAGNOSIS — Y92009 Unspecified place in unspecified non-institutional (private) residence as the place of occurrence of the external cause: Secondary | ICD-10-CM | POA: Diagnosis not present

## 2021-03-13 DIAGNOSIS — S22070S Wedge compression fracture of T9-T10 vertebra, sequela: Secondary | ICD-10-CM | POA: Diagnosis not present

## 2021-03-13 DIAGNOSIS — I2510 Atherosclerotic heart disease of native coronary artery without angina pectoris: Secondary | ICD-10-CM | POA: Diagnosis present

## 2021-03-13 DIAGNOSIS — R000 Tachycardia, unspecified: Secondary | ICD-10-CM | POA: Diagnosis present

## 2021-03-13 DIAGNOSIS — Z885 Allergy status to narcotic agent status: Secondary | ICD-10-CM

## 2021-03-13 DIAGNOSIS — S22070A Wedge compression fracture of T9-T10 vertebra, initial encounter for closed fracture: Secondary | ICD-10-CM | POA: Diagnosis present

## 2021-03-13 DIAGNOSIS — Z8249 Family history of ischemic heart disease and other diseases of the circulatory system: Secondary | ICD-10-CM | POA: Diagnosis not present

## 2021-03-13 DIAGNOSIS — I82220 Acute embolism and thrombosis of inferior vena cava: Principal | ICD-10-CM | POA: Diagnosis present

## 2021-03-13 DIAGNOSIS — D472 Monoclonal gammopathy: Secondary | ICD-10-CM | POA: Diagnosis present

## 2021-03-13 DIAGNOSIS — Z880 Allergy status to penicillin: Secondary | ICD-10-CM

## 2021-03-13 DIAGNOSIS — E785 Hyperlipidemia, unspecified: Secondary | ICD-10-CM | POA: Diagnosis present

## 2021-03-13 DIAGNOSIS — G8929 Other chronic pain: Secondary | ICD-10-CM | POA: Diagnosis present

## 2021-03-13 DIAGNOSIS — I10 Essential (primary) hypertension: Secondary | ICD-10-CM | POA: Diagnosis present

## 2021-03-13 DIAGNOSIS — Z83438 Family history of other disorder of lipoprotein metabolism and other lipidemia: Secondary | ICD-10-CM | POA: Diagnosis not present

## 2021-03-13 DIAGNOSIS — I82409 Acute embolism and thrombosis of unspecified deep veins of unspecified lower extremity: Secondary | ICD-10-CM | POA: Diagnosis present

## 2021-03-13 DIAGNOSIS — I82402 Acute embolism and thrombosis of unspecified deep veins of left lower extremity: Secondary | ICD-10-CM | POA: Diagnosis not present

## 2021-03-13 DIAGNOSIS — W19XXXA Unspecified fall, initial encounter: Secondary | ICD-10-CM

## 2021-03-13 DIAGNOSIS — Z811 Family history of alcohol abuse and dependence: Secondary | ICD-10-CM

## 2021-03-13 LAB — HEPARIN LEVEL (UNFRACTIONATED): Heparin Unfractionated: <0.1 IU/mL — ABNORMAL LOW (ref 0.30–0.70)

## 2021-03-13 LAB — RESP PANEL BY RT-PCR (FLU A&B, COVID) ARPGX2
Influenza A by PCR: NEGATIVE
Influenza B by PCR: NEGATIVE
SARS Coronavirus 2 by RT PCR: NEGATIVE

## 2021-03-13 LAB — COMPREHENSIVE METABOLIC PANEL
ALT: 19 U/L (ref 0–44)
AST: 25 U/L (ref 15–41)
Albumin: 2.8 g/dL — ABNORMAL LOW (ref 3.5–5.0)
Alkaline Phosphatase: 86 U/L (ref 38–126)
Anion gap: 12 (ref 5–15)
BUN: 27 mg/dL — ABNORMAL HIGH (ref 8–23)
CO2: 22 mmol/L (ref 22–32)
Calcium: 8.3 mg/dL — ABNORMAL LOW (ref 8.9–10.3)
Chloride: 88 mmol/L — ABNORMAL LOW (ref 98–111)
Creatinine, Ser: 1.22 mg/dL — ABNORMAL HIGH (ref 0.44–1.00)
GFR, Estimated: 46 mL/min — ABNORMAL LOW (ref >60)
Glucose, Bld: 114 mg/dL — ABNORMAL HIGH (ref 70–99)
Potassium: 4.6 mmol/L (ref 3.5–5.1)
Sodium: 122 mmol/L — ABNORMAL LOW (ref 135–145)
Total Bilirubin: 1.2 mg/dL (ref 0.3–1.2)
Total Protein: 5.6 g/dL — ABNORMAL LOW (ref 6.5–8.1)

## 2021-03-13 LAB — TSH: TSH: 3.924 u[IU]/mL (ref 0.350–4.500)

## 2021-03-13 LAB — BASIC METABOLIC PANEL
Anion gap: 9 (ref 5–15)
BUN: 21 mg/dL (ref 8–23)
CO2: 21 mmol/L — ABNORMAL LOW (ref 22–32)
Calcium: 8 mg/dL — ABNORMAL LOW (ref 8.9–10.3)
Chloride: 93 mmol/L — ABNORMAL LOW (ref 98–111)
Creatinine, Ser: 1.15 mg/dL — ABNORMAL HIGH (ref 0.44–1.00)
GFR, Estimated: 49 mL/min — ABNORMAL LOW (ref >60)
Glucose, Bld: 131 mg/dL — ABNORMAL HIGH (ref 70–99)
Potassium: 3.9 mmol/L (ref 3.5–5.1)
Sodium: 123 mmol/L — ABNORMAL LOW (ref 135–145)

## 2021-03-13 LAB — URINALYSIS, ROUTINE W REFLEX MICROSCOPIC
Bacteria, UA: NONE SEEN
Bilirubin Urine: NEGATIVE
Glucose, UA: NEGATIVE mg/dL
Hgb urine dipstick: NEGATIVE
Ketones, ur: 5 mg/dL — AB
Leukocytes,Ua: NEGATIVE
Nitrite: NEGATIVE
Protein, ur: >=300 mg/dL — AB
Specific Gravity, Urine: 1.03 (ref 1.005–1.030)
pH: 6 (ref 5.0–8.0)

## 2021-03-13 LAB — CBC WITH DIFFERENTIAL/PLATELET
Abs Immature Granulocytes: 0.05 10*3/uL (ref 0.00–0.07)
Basophils Absolute: 0 10*3/uL (ref 0.0–0.1)
Basophils Relative: 0 %
Eosinophils Absolute: 0 10*3/uL (ref 0.0–0.5)
Eosinophils Relative: 0 %
HCT: 27.7 % — ABNORMAL LOW (ref 36.0–46.0)
Hemoglobin: 9.8 g/dL — ABNORMAL LOW (ref 12.0–15.0)
Immature Granulocytes: 1 %
Lymphocytes Relative: 3 %
Lymphs Abs: 0.3 10*3/uL — ABNORMAL LOW (ref 0.7–4.0)
MCH: 34.1 pg — ABNORMAL HIGH (ref 26.0–34.0)
MCHC: 35.4 g/dL (ref 30.0–36.0)
MCV: 96.5 fL (ref 80.0–100.0)
Monocytes Absolute: 0.5 10*3/uL (ref 0.1–1.0)
Monocytes Relative: 5 %
Neutro Abs: 8.6 10*3/uL — ABNORMAL HIGH (ref 1.7–7.7)
Neutrophils Relative %: 91 %
Platelets: 257 10*3/uL (ref 150–400)
RBC: 2.87 MIL/uL — ABNORMAL LOW (ref 3.87–5.11)
RDW: 13.6 % (ref 11.5–15.5)
WBC: 9.4 10*3/uL (ref 4.0–10.5)
nRBC: 0 % (ref 0.0–0.2)

## 2021-03-13 LAB — APTT: aPTT: 34 seconds (ref 24–36)

## 2021-03-13 LAB — PROTIME-INR
INR: 1 (ref 0.8–1.2)
Prothrombin Time: 12.5 seconds (ref 11.4–15.2)

## 2021-03-13 LAB — CBG MONITORING, ED: Glucose-Capillary: 104 mg/dL — ABNORMAL HIGH (ref 70–99)

## 2021-03-13 LAB — CORTISOL: Cortisol, Plasma: 15.7 ug/dL

## 2021-03-13 IMAGING — CR DG LUMBAR SPINE COMPLETE 4+V
5 series · 5 of 5 positions shown · non-contrast
Comparison: [DATE], without report.

CLINICAL DATA: Low back pain and weakness since last chemotherapy.
History of multiple myeloma.

EXAM:
LUMBAR SPINE - COMPLETE 4+ VIEW

[t lumbar spine ap]
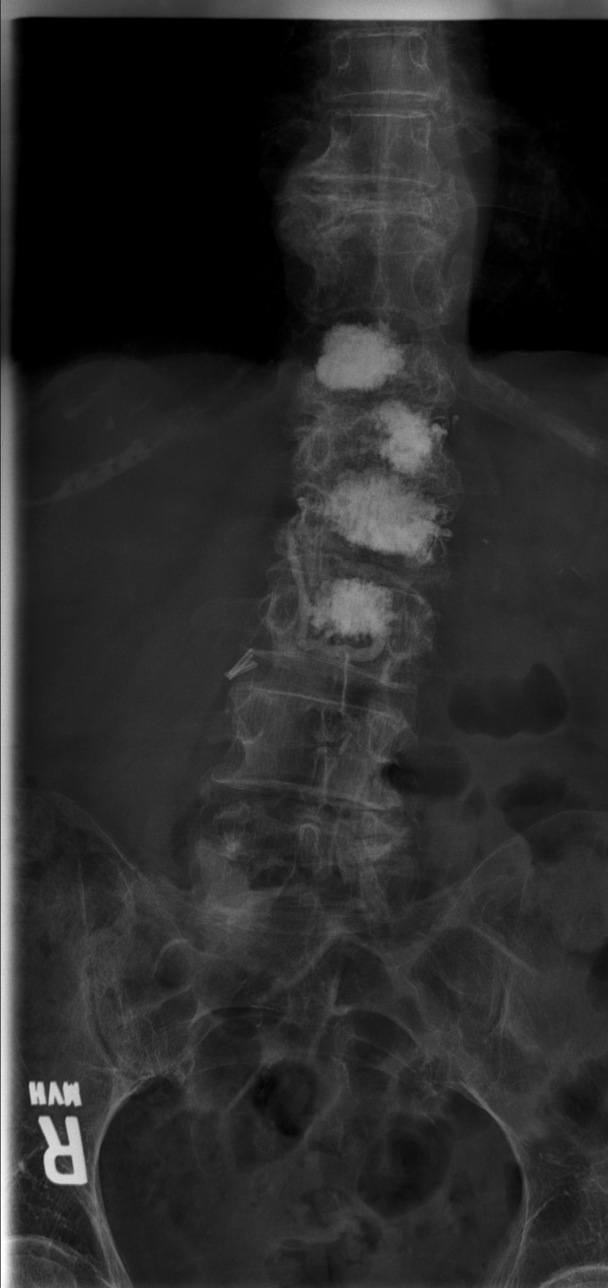

[t lumbar spine obl (1 of 2)]
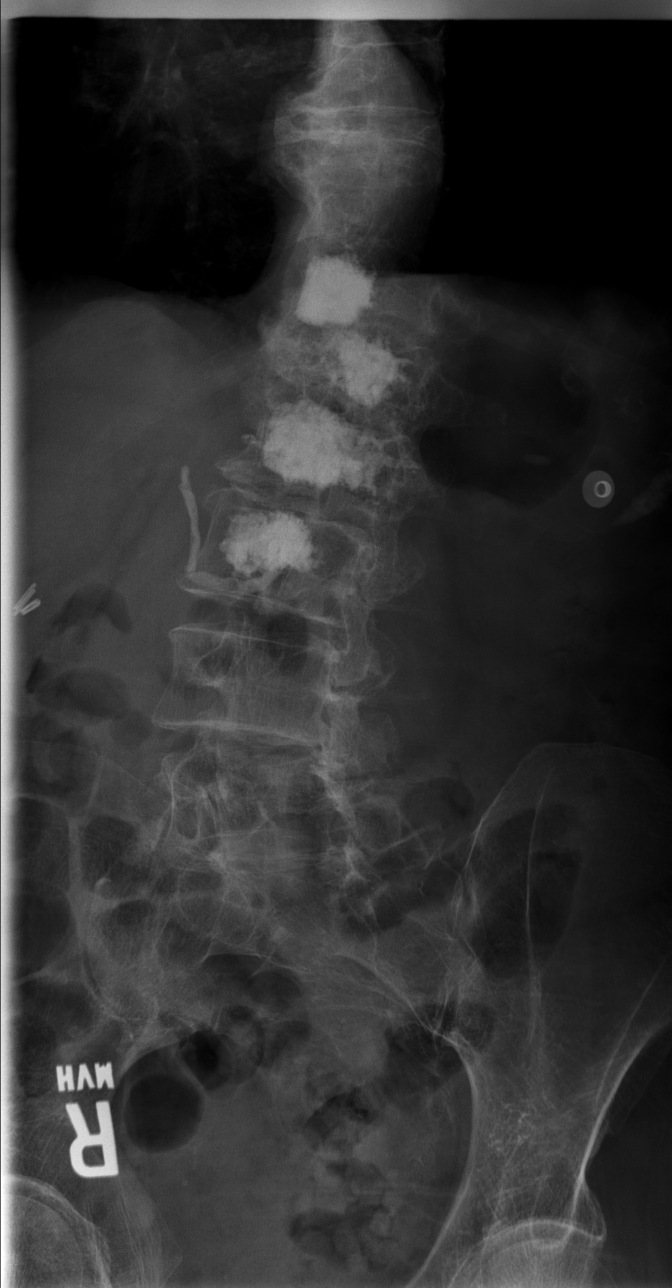

[t lumbar spine obl (2 of 2)]
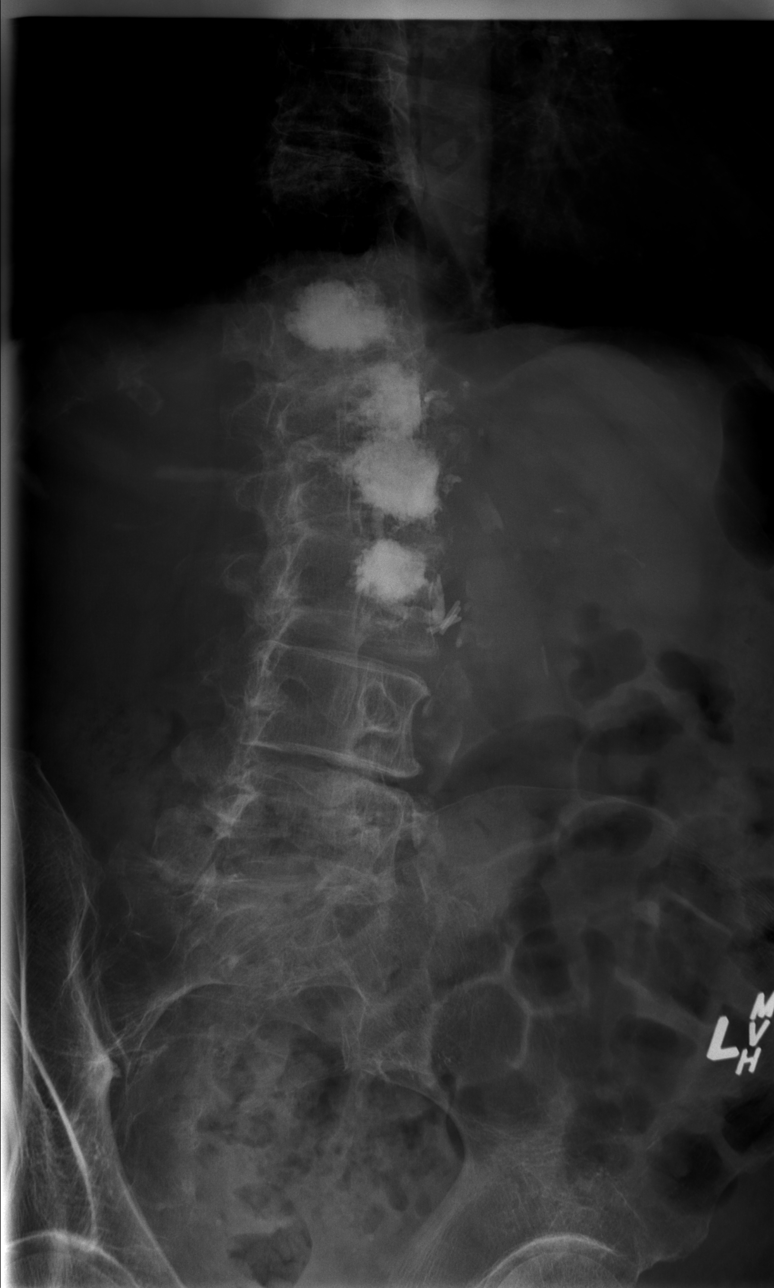

[t lumbar spine lat]
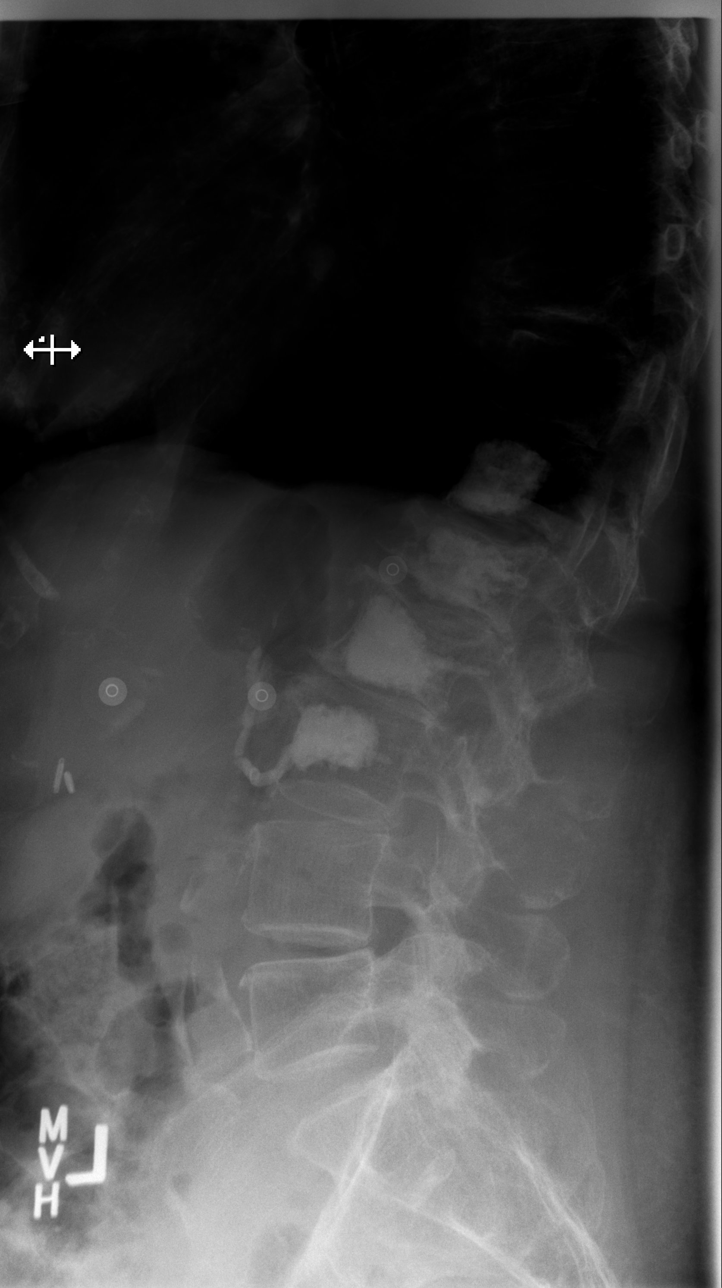

[t lumbar l-5 s-1 spot]
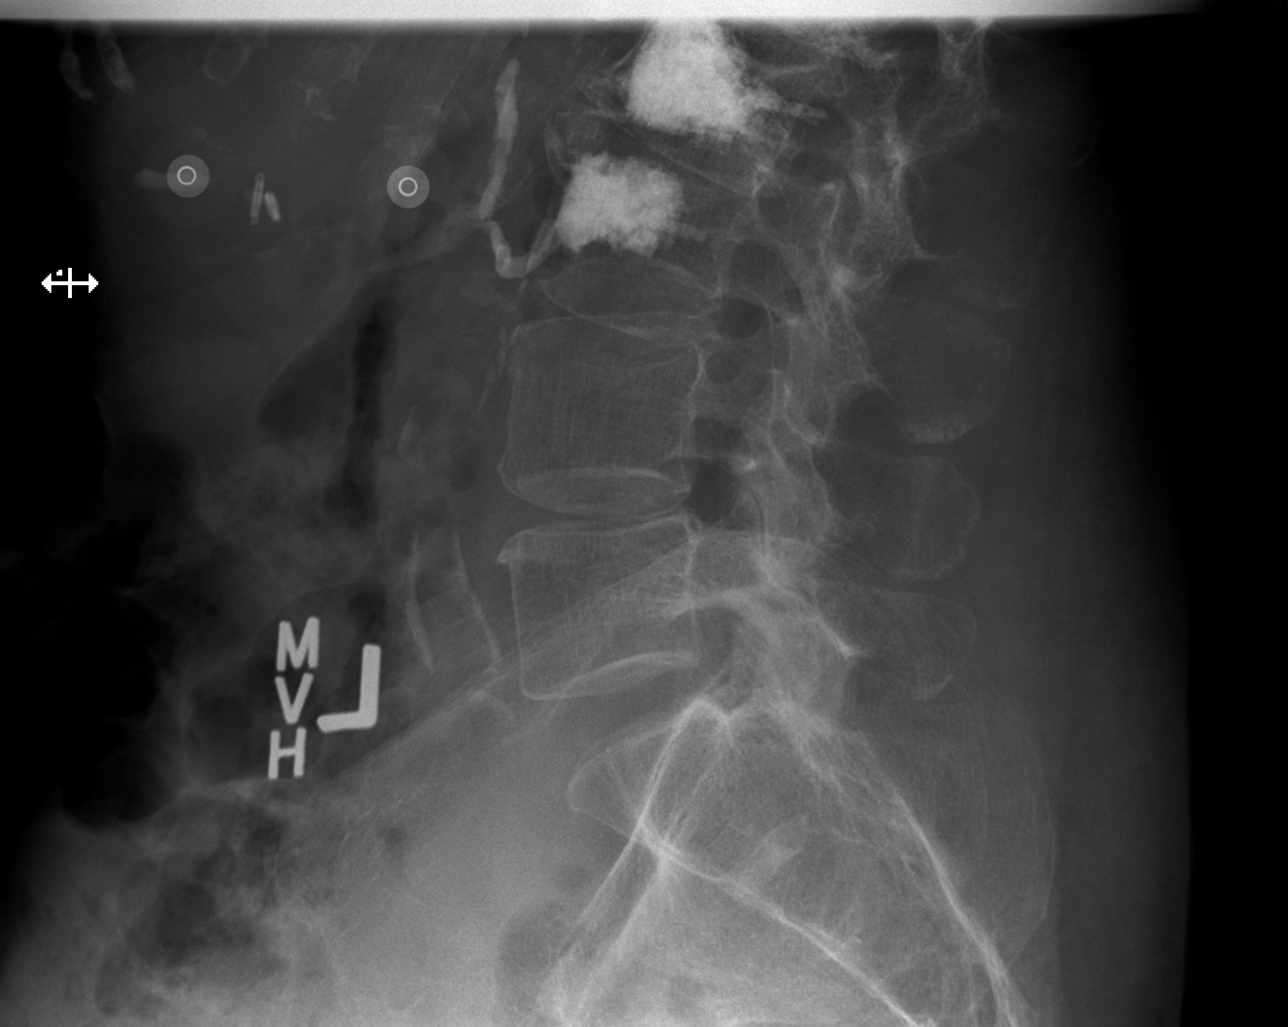

[5 of 5 positions shown; findings below may reference images not displayed]

FINDINGS: Advanced osteopenia. Five lumbar type vertebral bodies. Minimal
convex left lumbar spine curvature. Status post vertebral
augmentation at T12 through L3, similar to [VS]. Cement within
paravertebral veins on the right is similar. Severe T10 compression
deformity is only readily apparent on the AP image, likely new
compared [DATE].

Aortic atherosclerosis.
IMPRESSION: Status post T12 through L3 vertebral augmentation, grossly similar.

Severe T10 compression deformity, suboptimally evaluated but likely
new compared to [VS]. Consider dedicated thoracic spine radiographs.

## 2021-03-13 IMAGING — CT CT VENOGRAM ABD-PELV
2 of 9 series · 12 of 46 positions shown, 14 images · IV contrast (omnipaque)
Comparison: [DATE]
COMPARISON: [DATE]

Addendum:
CLINICAL DATA: 74-year-old female with reported history of deep
venous thrombosis.

EXAM:
CT ABDOMEN AND PELVIS WITH CONTRAST
TECHNIQUE: Multidetector CT imaging of the abdomen and pelvis was performed
using the standard protocol following bolus administration of
intravenous contrast.
CONTRAST:  100mL OMNIPAQUE IOHEXOL 350 MG/ML SOLN

[Series 5: coronals · coronal · 0.44mm/px · 2 of 130 slices shown]
[im 44/130  soft-tissue]
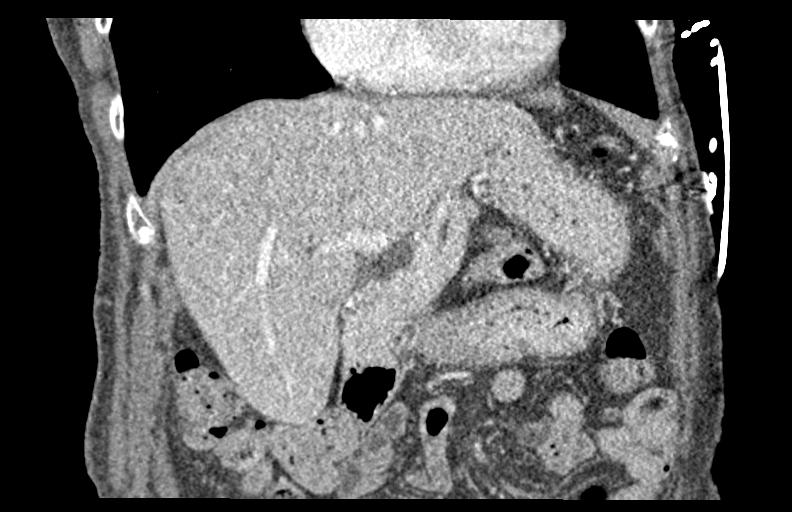
[im 87/130  soft-tissue]
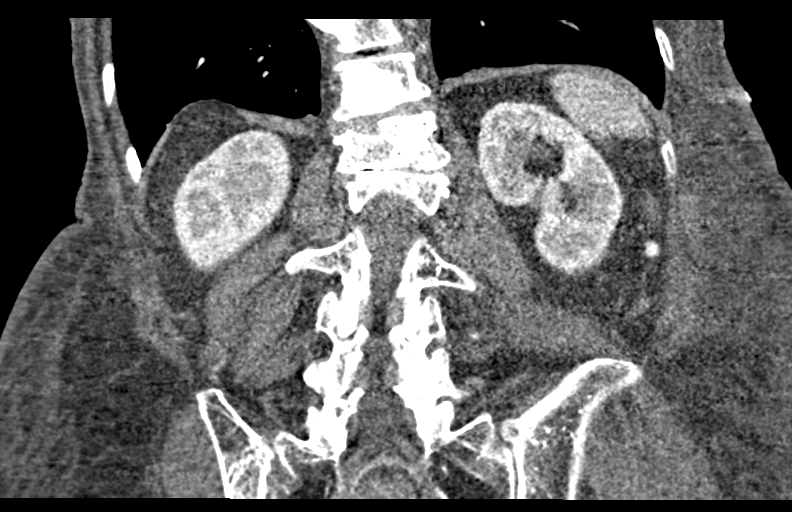

[Series 8: axial venous · axial · portal-venous · 0.75mm/px · z∈[-782,-362]mm · 10 of 97 slices shown, 12 images]
[im 7/97  soft-tissue]
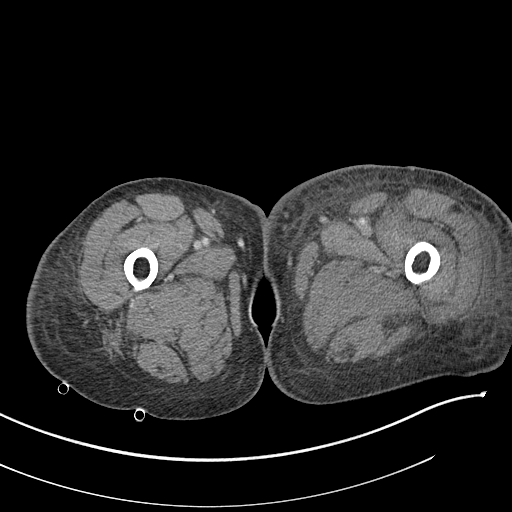
[im 7/97  bone]
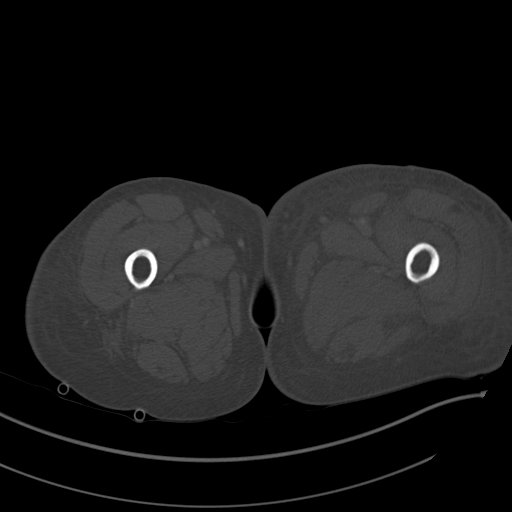
[im 19/97  soft-tissue]
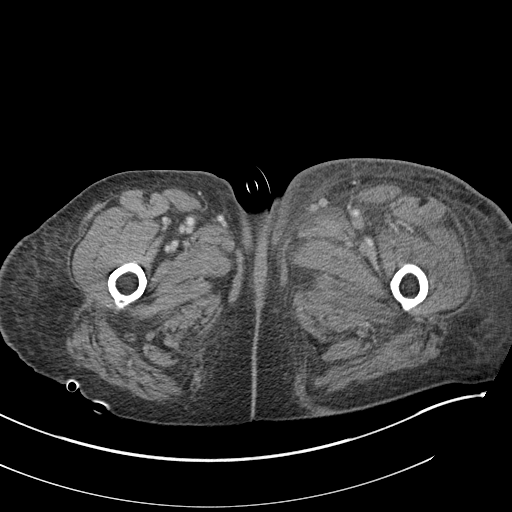
[im 25/97  soft-tissue]
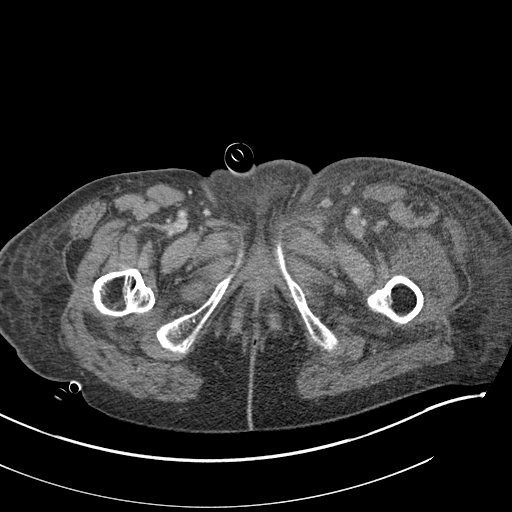
[im 37/97  soft-tissue]
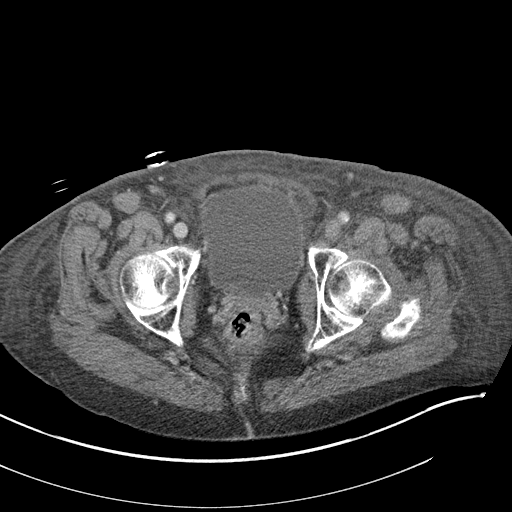
[im 43/97  soft-tissue]
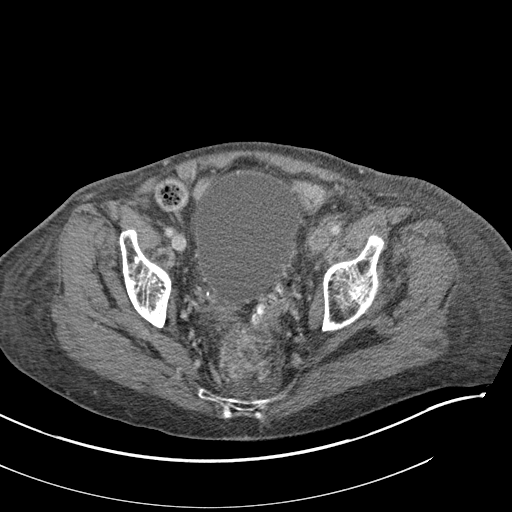
[im 55/97  soft-tissue]
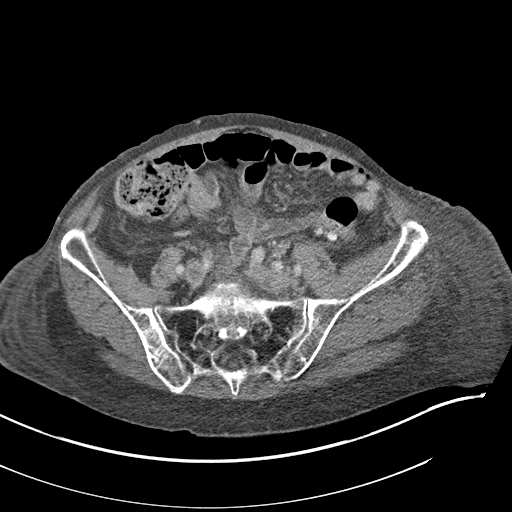
[im 61/97  soft-tissue]
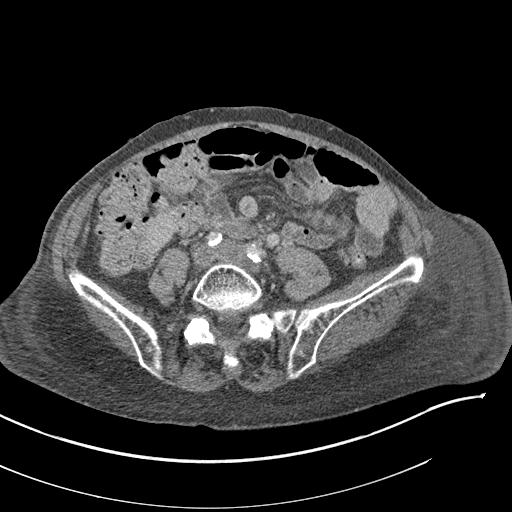
[im 73/97  soft-tissue]
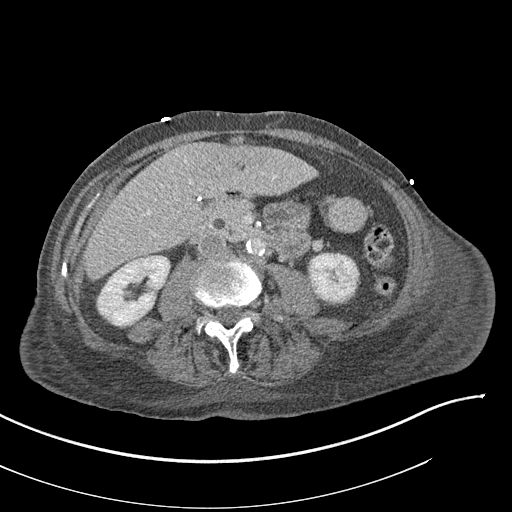
[im 79/97  soft-tissue]
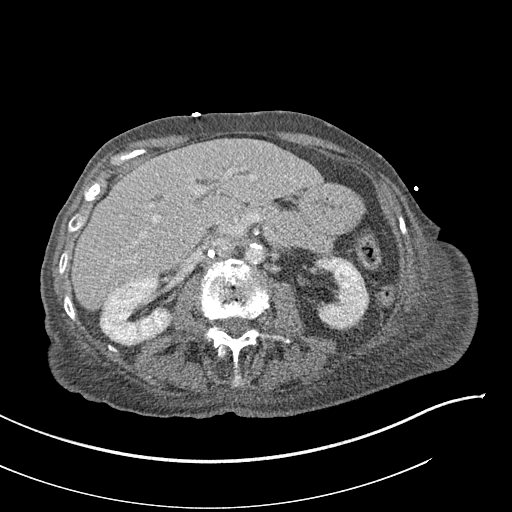
[im 79/97  bone]
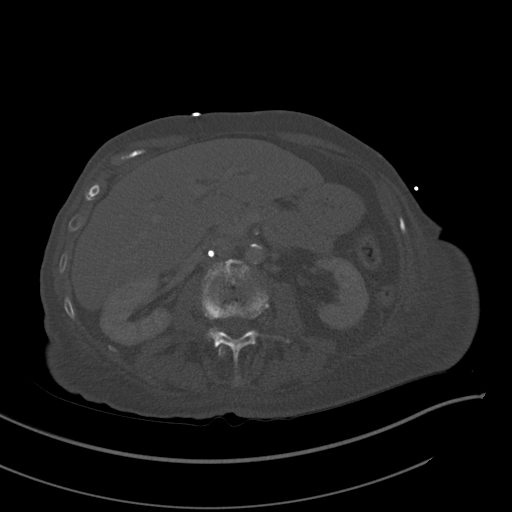
[im 91/97  soft-tissue]
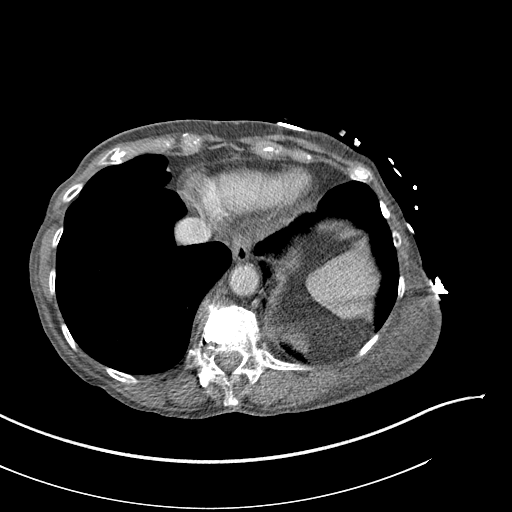

[12 of 46 positions shown; findings below may reference images not displayed]

FINDINGS: Lower chest: Stable small pulmonary nodule on image 21 of series 7
approximately 4 mm in the RIGHT lower lobe. Other scattered small
nodules with similar appearance in the lung bases. No effusion. No
consolidation.

Hepatobiliary: No focal, suspicious hepatic lesion. Variable hepatic
steatosis. Mild biliary duct distension following cholecystectomy
unchanged since previous imaging.

Pancreas: Normal, without mass, inflammation or ductal dilatation.

Spleen: Spleen normal size and contour.

Adrenals/Urinary Tract: Adrenal glands are normal.

Symmetric renal enhancement. No hydronephrosis. No suspicious renal
lesion. Cyst in the LEFT kidney and smaller cysts throughout the
kidneys. Largest on the LEFT posteriorly measuring approximately
cm. Urinary bladder with smooth contours.

Stomach/Bowel: No acute gastrointestinal process involving stomach
or small bowel. Appendix not visualized though no secondary signs of
acute appendicitis.

Colonic diverticulosis of the sigmoid colon. Mild pericolonic and
perirectal stranding at the rectosigmoid junction.

Vascular/Lymphatic: Occlusive thrombus of the inferior vena cava
beginning below the renal venous confluence. The fact relate from
previous vertebroplasty extending into the IVC lumen is unchanged, a
chronic finding. This passes through the thrombus in the lumen.

Bilateral iliac thrombus with expansion of the inferior IVC and
iliac veins and stranding about the venous systems in the pelvis and
generalized congestive changes suspected throughout the pelvis.
Thrombus on the LEFT extends into the femoral veins and there is
asymmetric lower extremity edema associated with this finding.

Calcified and noncalcified atheromatous plaque of the abdominal
aorta. No aneurysmal dilation. No abdominal lymphadenopathy.

No pelvic lymphadenopathy

Reproductive: Post hysterectomy.  No adnexal masses.

Other: Body wall edema.  No ascites.

Musculoskeletal: Spinal degenerative changes. No acute or
destructive bone process. Evidence of prior vertebroplasty at T12
through L3 and associated loss of height at these levels with
similar appearance to prior imaging.
IMPRESSION: 1. Occlusive thrombus expanding in the inferior vena cava below the
renal veins extending through the femoral vein on the LEFT and the
external iliac vein on the RIGHT. Also involving internal iliac vein
on the LEFT.
2. Colonic diverticulosis with mild stranding about the rectosigmoid
junction, potentially related to edema in the setting of extensive
deep venous thrombosis. Mild diverticulitis is not excluded though
not favored. Correlate with symptoms.
3. Variable hepatic steatosis.
4. Stable small pulmonary nodule in the lung bases.
5. Evidence of prior vertebroplasty at T12 through L3 and associated
loss of height at these levels with similar appearance to prior
imaging.
6. Aortic atherosclerosis.

Aortic Atherosclerosis ([KG]-[KG]).

ADDENDUM:
These results were called by telephone at the time of interpretation
on [DATE] at [DATE] to provider VISTED , who verbally
acknowledged these results.

*** End of Addendum ***
FINDINGS: Lower chest: Stable small pulmonary nodule on image 21 of series 7
approximately 4 mm in the RIGHT lower lobe. Other scattered small
nodules with similar appearance in the lung bases. No effusion. No
consolidation.

Hepatobiliary: No focal, suspicious hepatic lesion. Variable hepatic
steatosis. Mild biliary duct distension following cholecystectomy
unchanged since previous imaging.

Pancreas: Normal, without mass, inflammation or ductal dilatation.

Spleen: Spleen normal size and contour.

Adrenals/Urinary Tract: Adrenal glands are normal.

Symmetric renal enhancement. No hydronephrosis. No suspicious renal
lesion. Cyst in the LEFT kidney and smaller cysts throughout the
kidneys. Largest on the LEFT posteriorly measuring approximately
cm. Urinary bladder with smooth contours.

Stomach/Bowel: No acute gastrointestinal process involving stomach
or small bowel. Appendix not visualized though no secondary signs of
acute appendicitis.

Colonic diverticulosis of the sigmoid colon. Mild pericolonic and
perirectal stranding at the rectosigmoid junction.

Vascular/Lymphatic: Occlusive thrombus of the inferior vena cava
beginning below the renal venous confluence. The fact relate from
previous vertebroplasty extending into the IVC lumen is unchanged, a
chronic finding. This passes through the thrombus in the lumen.

Bilateral iliac thrombus with expansion of the inferior IVC and
iliac veins and stranding about the venous systems in the pelvis and
generalized congestive changes suspected throughout the pelvis.
Thrombus on the LEFT extends into the femoral veins and there is
asymmetric lower extremity edema associated with this finding.

Calcified and noncalcified atheromatous plaque of the abdominal
aorta. No aneurysmal dilation. No abdominal lymphadenopathy.

No pelvic lymphadenopathy

Reproductive: Post hysterectomy.  No adnexal masses.

Other: Body wall edema.  No ascites.

Musculoskeletal: Spinal degenerative changes. No acute or
destructive bone process. Evidence of prior vertebroplasty at T12
through L3 and associated loss of height at these levels with
similar appearance to prior imaging.
IMPRESSION: 1. Occlusive thrombus expanding in the inferior vena cava below the
renal veins extending through the femoral vein on the LEFT and the
external iliac vein on the RIGHT. Also involving internal iliac vein
on the LEFT.
2. Colonic diverticulosis with mild stranding about the rectosigmoid
junction, potentially related to edema in the setting of extensive
deep venous thrombosis. Mild diverticulitis is not excluded though
not favored. Correlate with symptoms.
3. Variable hepatic steatosis.
4. Stable small pulmonary nodule in the lung bases.
5. Evidence of prior vertebroplasty at T12 through L3 and associated
loss of height at these levels with similar appearance to prior
imaging.
6. Aortic atherosclerosis.

Aortic Atherosclerosis ([KG]-[KG]).

## 2021-03-13 MED ORDER — HEPARIN (PORCINE) 25000 UT/250ML-% IV SOLN
1100.0000 [IU]/h | INTRAVENOUS | Status: DC
  Administered 2021-03-13: 1000 [IU]/h via INTRAVENOUS
  Administered 2021-03-14: 1050 [IU]/h via INTRAVENOUS
  Administered 2021-03-15 (×2): 1100 [IU]/h via INTRAVENOUS
  Filled 2021-03-13 (×3): qty 250

## 2021-03-13 MED ORDER — IOHEXOL 350 MG/ML SOLN
100.0000 mL | Freq: Once | INTRAVENOUS | Status: AC | PRN
  Administered 2021-03-13: 100 mL via INTRAVENOUS

## 2021-03-13 MED ORDER — MELATONIN 10 MG PO TABS: 10.0000 mg | ORAL_TABLET | Freq: Every day | ORAL | Status: DC

## 2021-03-13 MED ORDER — VITAMIN D 50 MCG (2000 UT) PO TABS: 2000.0000 [IU] | ORAL_TABLET | Freq: Every day | ORAL | Status: DC

## 2021-03-13 MED ORDER — MELATONIN 5 MG PO TABS
10.0000 mg | ORAL_TABLET | Freq: Every day | ORAL | Status: DC
  Administered 2021-03-13 – 2021-03-19 (×7): 10 mg via ORAL
  Filled 2021-03-13 (×7): qty 2

## 2021-03-13 MED ORDER — ONDANSETRON HCL 4 MG PO TABS: 4.0000 mg | ORAL_TABLET | Freq: Four times a day (QID) | ORAL | Status: DC | PRN

## 2021-03-13 MED ORDER — LOSARTAN POTASSIUM 50 MG PO TABS
100.0000 mg | ORAL_TABLET | Freq: Every day | ORAL | Status: DC
  Filled 2021-03-13: qty 2

## 2021-03-13 MED ORDER — VITAMIN D 25 MCG (1000 UNIT) PO TABS
2000.0000 [IU] | ORAL_TABLET | Freq: Every day | ORAL | Status: DC
  Administered 2021-03-13 – 2021-03-20 (×7): 2000 [IU] via ORAL
  Filled 2021-03-13 (×7): qty 2

## 2021-03-13 MED ORDER — ALBUTEROL SULFATE (2.5 MG/3ML) 0.083% IN NEBU: 2.5000 mg | INHALATION_SOLUTION | Freq: Four times a day (QID) | RESPIRATORY_TRACT | Status: DC | PRN

## 2021-03-13 MED ORDER — COSYNTROPIN 0.25 MG IJ SOLR
0.2500 mg | Freq: Once | INTRAMUSCULAR | Status: AC
  Administered 2021-03-14: 0.25 mg via INTRAVENOUS
  Filled 2021-03-13: qty 0.25

## 2021-03-13 MED ORDER — ZOLPIDEM TARTRATE 5 MG PO TABS
5.0000 mg | ORAL_TABLET | Freq: Every day | ORAL | Status: DC
  Administered 2021-03-13 – 2021-03-19 (×7): 5 mg via ORAL
  Filled 2021-03-13 (×7): qty 1

## 2021-03-13 MED ORDER — ONDANSETRON HCL 4 MG/2ML IJ SOLN: 4.0000 mg | Freq: Four times a day (QID) | INTRAMUSCULAR | Status: DC | PRN

## 2021-03-13 MED ORDER — ALPRAZOLAM 0.25 MG PO TABS
0.2500 mg | ORAL_TABLET | Freq: Every day | ORAL | Status: DC | PRN
  Administered 2021-03-18 – 2021-03-19 (×3): 0.25 mg via ORAL
  Filled 2021-03-13 (×3): qty 1

## 2021-03-13 MED ORDER — HYPROMELLOSE (GONIOSCOPIC) 2.5 % OP SOLN
1.0000 [drp] | OPHTHALMIC | Status: DC | PRN
  Administered 2021-03-17: 1 [drp] via OPHTHALMIC
  Filled 2021-03-13: qty 15

## 2021-03-13 MED ORDER — TRAMADOL HCL 50 MG PO TABS
50.0000 mg | ORAL_TABLET | Freq: Every day | ORAL | Status: DC | PRN
  Administered 2021-03-13 – 2021-03-19 (×6): 50 mg via ORAL
  Filled 2021-03-13 (×7): qty 1

## 2021-03-13 MED ORDER — PROPYLENE GLYCOL 0.6 % OP SOLN: 1.0000 [drp] | Freq: Every day | OPHTHALMIC | Status: DC

## 2021-03-13 MED ORDER — ALBUTEROL SULFATE HFA 108 (90 BASE) MCG/ACT IN AERS: 2.0000 | INHALATION_SPRAY | Freq: Four times a day (QID) | RESPIRATORY_TRACT | Status: DC | PRN

## 2021-03-13 MED ORDER — SODIUM CHLORIDE 0.9 % IV BOLUS
1000.0000 mL | Freq: Once | INTRAVENOUS | Status: AC
  Administered 2021-03-13: 1000 mL via INTRAVENOUS

## 2021-03-13 MED ORDER — HEPARIN BOLUS VIA INFUSION
4000.0000 [IU] | Freq: Once | INTRAVENOUS | Status: AC
  Administered 2021-03-13: 4000 [IU] via INTRAVENOUS
  Filled 2021-03-13: qty 4000

## 2021-03-13 MED ORDER — ACYCLOVIR 400 MG PO TABS
400.0000 mg | ORAL_TABLET | Freq: Two times a day (BID) | ORAL | Status: DC
  Administered 2021-03-13 – 2021-03-20 (×13): 400 mg via ORAL
  Filled 2021-03-13 (×15): qty 1

## 2021-03-13 MED ORDER — ROSUVASTATIN CALCIUM 5 MG PO TABS
10.0000 mg | ORAL_TABLET | Freq: Every day | ORAL | Status: DC
  Administered 2021-03-14 – 2021-03-20 (×6): 10 mg via ORAL
  Filled 2021-03-13 (×6): qty 2

## 2021-03-13 NOTE — ED Notes (Signed)
US at bedside

## 2021-03-13 NOTE — ED Triage Notes (Signed)
Pt w hx of multiple myeloma c/o lower back pain and weakness since last chemo treatment on Thursday. Pt c/o poor appetite and dizziness when standing.   BP 143/76 HR 102 RR 18 98% ra

## 2021-03-13 NOTE — Telephone Encounter (Signed)
Benefit verification received. No PA. OOP cost $25 possibly but per Joycelyn Schmid with [employer] this most likely will not be billed to patient. Patient has primary and supplemental insurance. Please schedule patient for lab and NV appointment.

## 2021-03-13 NOTE — ED Notes (Signed)
[facility] called for transport. 

## 2021-03-13 NOTE — ED Notes (Signed)
Family has questions for provider, provider made aware

## 2021-03-13 NOTE — ED Notes (Signed)
Pt placed on purewick 
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
## 2021-03-13 NOTE — ED Notes (Signed)
At pts request, Heparin was changed to new IV left arm since she is rt handed

## 2021-03-13 NOTE — CV Procedure (Signed)
LLE venous duplex completed. Critical results verbally given to Dr. Laverta Baltimore and A. Girard, [place] at 1420.   Results can be found under chart review under CV PROC. 03/13/2021 2:40 PM Shalev Helminiak RVT, RDMS
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
## 2021-03-13 NOTE — Telephone Encounter (Signed)
LVM for patient to call office back.

## 2021-03-13 NOTE — Telephone Encounter (Signed)
Checked out appointment. No LOS notes needing to be scheduled. No changes made. 

## 2021-03-13 NOTE — Progress Notes (Signed)
ANTICOAGULATION CONSULT NOTE - Initial Consult  Pharmacy Consult for Heparin Indication: DVT  Allergies  Allergen Reactions  . Bentyl [Dicyclomine Hcl]     Groggy, blurred vision  . Butalbital-Aspirin-Caffeine Other (See Comments)    hallucinations  . Clonidine Derivatives     dizziness, lightheadedness, abdominal cramping, dry mouth/throat  . Linzess [Linaclotide] Diarrhea  . Morphine And Related Other (See Comments)    Does not work  . Motrin [Ibuprofen]     GI upset  . Penicillins Other (See Comments)    As child; reaction unknown Has patient had a PCN reaction causing immediate rash, facial/tongue/throat swelling, SOB or lightheadedness with hypotension: No Has patient had a PCN reaction causing severe rash involving mucus membranes or skin necrosis: No Has patient had a PCN reaction that required hospitalization: No Has patient had a PCN reaction occurring within the last 10 years: No If all of the above answers are "NO", then may proceed with Cephalosporin use.  . Sulfa Antibiotics     In childhood  . Zanaflex [Tizanidine Hcl] Other (See Comments)    Decreased BP  . Diphenhydramine Hcl Palpitations    restlessness    Patient Measurements: Height: 5' 2" (157.5 cm) Weight: 60.8 kg (134 lb) IBW/kg (Calculated) : 50.1 Heparin Dosing Weight: actual weight  Vital Signs: Temp: 98.3 F (36.8 C) (03/21 1234) Temp Source: Oral (03/21 1234) BP: 110/60 (03/21 1315) Pulse Rate: 101 (03/21 1315)  Labs: Recent Labs    03/13/21 1317  HGB 9.8*  HCT 27.7*  PLT 257  CREATININE 1.22*    Estimated Creatinine Clearance: 33.7 mL/min (A) (by C-G formula based on SCr of 1.22 mg/dL (H)).   Medical History: Past Medical History:  Diagnosis Date  . Allergy   . Arthritis   . Cataract    removed  . Colon polyps   . Foot fracture    with surgery  . GERD (gastroesophageal reflux disease)   . Hepatitis A    Viral - got better  . History of miscarriage   . Hypertension    . Hyponatremia   . Insomnia   . Osteoporosis     Assessment: 75 y/o F with a h/o multiple myeloma on chemo admitted with poor appetite and dizziness. LE dopplers revealed DVT. Med history is incomplete and patient is not answering phone in room. PT/INR wnl.    Goal of Therapy:  Heparin level 0.3-0.7 units/ml Monitor platelets by anticoagulation protocol: Yes   Plan:  Will add on aPTT and HL Give 4000 units bolus x 1 Start heparin infusion at 1000 units/hr Check anti-Xa level in 8 hours and daily while on heparin Continue to monitor H&H and platelets  Ulice Dash D 03/13/2021,2:55 PM

## 2021-03-14 ENCOUNTER — Telehealth: Payer: Self-pay | Admitting: *Deleted

## 2021-03-14 DIAGNOSIS — S22070S Wedge compression fracture of T9-T10 vertebra, sequela: Secondary | ICD-10-CM

## 2021-03-14 DIAGNOSIS — I824Y2 Acute embolism and thrombosis of unspecified deep veins of left proximal lower extremity: Secondary | ICD-10-CM

## 2021-03-14 DIAGNOSIS — Y92009 Unspecified place in unspecified non-institutional (private) residence as the place of occurrence of the external cause: Secondary | ICD-10-CM

## 2021-03-14 DIAGNOSIS — N179 Acute kidney failure, unspecified: Secondary | ICD-10-CM

## 2021-03-14 DIAGNOSIS — E8581 Light chain (AL) amyloidosis: Secondary | ICD-10-CM

## 2021-03-14 DIAGNOSIS — W19XXXA Unspecified fall, initial encounter: Secondary | ICD-10-CM

## 2021-03-14 DIAGNOSIS — E871 Hypo-osmolality and hyponatremia: Secondary | ICD-10-CM

## 2021-03-14 DIAGNOSIS — I824Z3 Acute embolism and thrombosis of unspecified deep veins of distal lower extremity, bilateral: Secondary | ICD-10-CM

## 2021-03-14 DIAGNOSIS — E7800 Pure hypercholesterolemia, unspecified: Secondary | ICD-10-CM

## 2021-03-14 DIAGNOSIS — S22070A Wedge compression fracture of T9-T10 vertebra, initial encounter for closed fracture: Secondary | ICD-10-CM | POA: Diagnosis present

## 2021-03-14 LAB — ACTH STIMULATION, 3 TIME POINTS
Cortisol, 30 Min: 29.4 ug/dL
Cortisol, 60 Min: 35.9 ug/dL
Cortisol, Base: 17.7 ug/dL

## 2021-03-14 LAB — CBC
HCT: 23.6 % — ABNORMAL LOW (ref 36.0–46.0)
Hemoglobin: 8.5 g/dL — ABNORMAL LOW (ref 12.0–15.0)
MCH: 34.1 pg — ABNORMAL HIGH (ref 26.0–34.0)
MCHC: 36 g/dL (ref 30.0–36.0)
MCV: 94.8 fL (ref 80.0–100.0)
Platelets: 282 10*3/uL (ref 150–400)
RBC: 2.49 MIL/uL — ABNORMAL LOW (ref 3.87–5.11)
RDW: 13.7 % (ref 11.5–15.5)
WBC: 6.9 10*3/uL (ref 4.0–10.5)
nRBC: 0 % (ref 0.0–0.2)

## 2021-03-14 LAB — SURGICAL PCR SCREEN
MRSA, PCR: NEGATIVE
Staphylococcus aureus: NEGATIVE

## 2021-03-14 LAB — HEPARIN LEVEL (UNFRACTIONATED)
Heparin Unfractionated: 0.35 IU/mL (ref 0.30–0.70)
Heparin Unfractionated: 0.33 IU/mL (ref 0.30–0.70)

## 2021-03-14 MED ORDER — BUMETANIDE 0.5 MG PO TABS
0.5000 mg | ORAL_TABLET | Freq: Every day | ORAL | Status: DC
  Administered 2021-03-14: 0.5 mg via ORAL
  Filled 2021-03-14 (×3): qty 1

## 2021-03-14 NOTE — Progress Notes (Signed)
[place] for Heparin Indication: DVT  Allergies  Allergen Reactions  . Bentyl [Dicyclomine Hcl]     Groggy, blurred vision  . Butalbital-Aspirin-Caffeine Other (See Comments)    hallucinations  . Clonidine Derivatives     dizziness, lightheadedness, abdominal cramping, dry mouth/throat  . Linzess [Linaclotide] Diarrhea  . Morphine And Related Other (See Comments)    Does not work  . Motrin [Ibuprofen]     GI upset  . Penicillins Other (See Comments)    As child; reaction unknown Has patient had a PCN reaction causing immediate rash, facial/tongue/throat swelling, SOB or lightheadedness with hypotension: No Has patient had a PCN reaction causing severe rash involving mucus membranes or skin necrosis: No Has patient had a PCN reaction that required hospitalization: No Has patient had a PCN reaction occurring within the last 10 years: No If all of the above answers are "NO", then may proceed with Cephalosporin use.  . Sulfa Antibiotics     In childhood  . Zanaflex [Tizanidine Hcl] Other (See Comments)    Decreased BP  . Diphenhydramine Hcl Palpitations    restlessness    Patient Measurements: Height: '5\' 2"'  (157.5 cm) Weight: 60.8 kg (134 lb) IBW/kg (Calculated) : 50.1 Heparin Dosing Weight: actual weight  Vital Signs: Temp: 98.1 F (36.7 C) (03/21 2233) Temp Source: Oral (03/21 2230) BP: 128/67 (03/21 2236) Pulse Rate: 105 (03/21 2236)  Labs: Recent Labs    03/13/21 1317 03/13/21 1432 03/13/21 2211 03/14/21 0021  HGB 9.8*  --   --  8.5*  HCT 27.7*  --   --  23.6*  PLT 257  --   --  282  APTT  --  34  --   --   LABPROT  --  12.5  --   --   INR  --  1.0  --   --   HEPARINUNFRC  --  <0.10*  --  0.35  CREATININE 1.22*  --  1.15*  --     Estimated Creatinine Clearance: 35.7 mL/min (A) (by C-G formula based on SCr of 1.15 mg/dL (H)).   Assessment: 75 y/o F with a h/o multiple myeloma on chemo admitted with poor appetite  and dizziness. LE dopplers revealed DVT. Med history is incomplete and patient is not answering phone in room. PT/INR wnl.   Heparin level therapeutic (0.35) on gtt at 1000 units/hr. No bleeding noted.  Goal of Therapy:  Heparin level 0.3-0.7 units/ml Monitor platelets by anticoagulation protocol: Yes   Plan:  Continue heparin infusion at 1000 units/hr F/u 6 hr confirmatory heparin level  Sherlon Handing, PharmD, BCPS Please see amion for complete clinical pharmacist phone list 03/14/2021,1:31 AM

## 2021-03-14 NOTE — Plan of Care (Signed)

## 2021-03-14 NOTE — TOC Benefit Eligibility Note (Signed)
Patient Teacher, English as a foreign language completed.    The patient is currently admitted and upon discharge could be taking Eliquis 5 mg.  The current 30 day co-pay is, $345.31.   The patient is currently admitted and upon discharge could be taking Xarelto 20 mg.  The current 30 day co-pay is, $342.00.   The patient is insured through [place] [place] Patient Advocate Specialist [place] Team Direct Number: [phone]  Fax: [phone]

## 2021-03-14 NOTE — Plan of Care (Signed)
  Problem: Activity: Goal: Risk for activity intolerance will decrease Outcome: Progressing   Problem: Coping: Goal: Level of anxiety will decrease Outcome: Progressing   Problem: Pain Managment: Goal: General experience of comfort will improve Outcome: Progressing   Problem: Safety: Goal: Ability to remain free from injury will improve Outcome: Progressing   Problem: Skin Integrity: Goal: Risk for impaired skin integrity will decrease Outcome: Progressing   

## 2021-03-14 NOTE — Progress Notes (Addendum)
     Subjective  - No significant change in left LE edema.   Objective (!) 106/56 (!) 104 98.3 F (36.8 C) (Oral) 18 100%  Intake/Output Summary (Last 24 hours) at 03/14/2021 0752 Last data filed at 03/14/2021 0435 Gross per 24 hour  Intake --  Output 725 ml  Net -725 ml    Palpable AT pulse Pitting edema in left LE No erythema.  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    NPO order in place for MN  Roxy Horseman 03/14/2021 7:52 AM --  Laboratory Lab Results: Recent Labs    03/13/21 1317 03/14/21 0021  WBC 9.4 6.9  HGB 9.8* 8.5*  HCT 27.7* 23.6*  PLT 257 282   BMET Recent Labs    03/13/21 1317 03/13/21 2211  NA 122* 123*  K 4.6 3.9  CL 88* 93*  CO2 22 21*  GLUCOSE 114* 131*  BUN 27* 21  CREATININE 1.22* 1.15*  CALCIUM 8.3* 8.0*    COAG Lab Results  Component Value Date   INR 1.0 03/13/2021   INR 1.0 01/24/2021   INR 1.12 06/08/2017   No results found for: PTT  I have independently interviewed and examined patient and agree with PA assessment and plan above.  Swelling is unchanged.  I discussed proceeding tomorrow with venous mechanical thrombectomy from a bilateral popliteal approach with prone positioning and general anesthesia.  I discussed the risks including pulmonary embolus and risk of injury to nerves.  I have also specifically discussed that some of this may be chronic and unable to be removed.  The bailout procedure will be leaving lytic catheters with return trip to the operating room on Thursday.  Patient demonstrates good understanding will be n.p.o. past midnight.  Jeselle Hiser C. Donzetta Matters, MD [facility] Office: [phone] Pager: [phone]

## 2021-03-14 NOTE — Telephone Encounter (Signed)
Patient stated that she is currently in the hospital with a DVT and fluid retention. Will defer making appointment for Prolia at this time until patient gets out of hospital.

## 2021-03-14 NOTE — Evaluation (Signed)
Physical Therapy Evaluation Patient Details Name: Wendy [name]. Filosa MRN: [id number] DOB: 02-26-1946 Today's Date: 03/14/2021   History of Present Illness  Delicia L. Stimson is a 75 y.o. female admitted 3/21 for weakness, leg swelling and mechanical fall. Pt found to have extensive bilat LE DVTs with plans for bilat thrombectomy Bilat LEs on 3/23.  PMH: amyloidosis, HTN, HLD, old T10 compression fracture    Clinical Impression  Pt admitted with above. Pt with bilat LE swelling causing instability with ambulation requiring AD to prevent fall. Aware pt is going for bilat LE thrombectomy tomorrow and will re-evaluates patients mobility as appropriate. Spoke with patient and dtr extensively regarding d/c options.   Pt well aware of her deficits and need for increased assist at home. Pt talked about calling [employer] [employer] to help during the day and daughter to stay at night. Acute PT to cont to follow and re-assess pts mobility.     Follow Up Recommendations Home health PT;Supervision/Assistance - 24 hour (will need ST-SNF if 24/7 assist can't be provided, pt talking about hiring [facility] [employer] during the day and daughter staying at night)    Equipment Recommendations  Rolling walker with 5" wheels    Recommendations for Other Services       Precautions / Restrictions Precautions Precautions: Fall Precaution Comments: bilat LE edema up t/o hips/pelvis Restrictions Weight Bearing Restrictions: No      Mobility  Bed Mobility Overal bed mobility: Modified Independent             General bed mobility comments: HOB elevated, no physical assist needed    Transfers Overall transfer level: Needs assistance Equipment used: None Transfers: Sit to/from Stand Sit to Stand: Min guard         General transfer comment: increased time, wide base of support, pt reaching for something to hold onto  Ambulation/Gait Ambulation/Gait assistance: Min assist Gait Distance (Feet): 40 Feet Assistive  device: IV Pole Gait Pattern/deviations: Step-through pattern;Decreased stride length;Wide base of support Gait velocity: dec Gait velocity interpretation: <1.31 ft/sec, indicative of household ambulator General Gait Details: pt unsteady in which she admits and would benefit from walker however used IV pole as pt limited to amb in room due to bilat LE extensive DVTs,  Stairs            Wheelchair Mobility    Modified Rankin (Stroke Patients Only)       Balance Overall balance assessment: Needs assistance Sitting-balance support: Feet supported;No upper extremity supported Sitting balance-Leahy Scale: Good     Standing balance support: Single extremity supported Standing balance-Leahy Scale: Fair Standing balance comment: pt requiring unilateral UE support for safe dynamic standing balance                             Pertinent Vitals/Pain Pain Assessment: No/denies pain    Home Living Family/patient expects to be discharged to:: Private residence Living Arrangements: Alone Available Help at Discharge: Family;Available PRN/intermittently Type of Home: House Home Access: Stairs to enter Entrance Stairs-Rails: Right Entrance Stairs-Number of Steps: 5 Home Layout: Two level Home Equipment: None      Prior Function Level of Independence: Independent               Hand Dominance   Dominant Hand: Right    Extremity/Trunk Assessment   Upper Extremity Assessment Upper Extremity Assessment: Overall WFL for tasks assessed    Lower Extremity Assessment Lower Extremity Assessment: Generalized weakness (bilat  LEs with significant swelling)       Communication   Communication: No difficulties  Cognition Arousal/Alertness: Awake/alert Behavior During Therapy: WFL for tasks assessed/performed Overall Cognitive Status: Within Functional Limits for tasks assessed                                        General Comments General  comments (skin integrity, edema, etc.): bilat LE edema t/o entire leg up into hips/pelvis    Exercises     Assessment/Plan    PT Assessment Patient needs continued PT services  PT Problem List Decreased strength;Decreased range of motion;Decreased activity tolerance;Decreased balance;Decreased mobility;Decreased knowledge of use of DME       PT Treatment Interventions DME instruction;Gait training;Stair training;Functional mobility training;Therapeutic activities;Therapeutic exercise;Balance training;Neuromuscular re-education    PT Goals (Current goals can be found in the Care Plan section)  Acute Rehab PT Goals Patient Stated Goal: get my legs taken care of PT Goal Formulation: With patient/family Time For Goal Achievement: 03/28/21 Potential to Achieve Goals: Good    Frequency Min 3X/week   Barriers to discharge Decreased caregiver support lives alone    Co-evaluation               AM-PAC PT "6 Clicks" Mobility  Outcome Measure Help needed turning from your back to your side while in a flat bed without using bedrails?: None Help needed moving from lying on your back to sitting on the side of a flat bed without using bedrails?: None Help needed moving to and from a bed to a chair (including a wheelchair)?: A Little Help needed standing up from a chair using your arms (e.g., wheelchair or bedside chair)?: A Little Help needed to walk in hospital room?: A Little Help needed climbing 3-5 steps with a railing? : A Lot 6 Click Score: 19    End of Session Equipment Utilized During Treatment: Gait belt Activity Tolerance: Patient tolerated treatment well Patient left: in chair;with call bell/phone within reach;with family/visitor present Nurse Communication: Mobility status (IV beeping) PT Visit Diagnosis: Unsteadiness on feet (R26.81);Muscle weakness (generalized) (M62.81)    Time: 7342-8768 PT Time Calculation (min) (ACUTE ONLY): 23 min   Charges:   PT  Evaluation $PT Eval Moderate Complexity: 1 Mod PT Treatments $Gait Training: 8-22 mins        Kittie Plater, PT, DPT Acute Rehabilitation Services Pager #: [phone] Office #: [phone]   Berline Lopes 03/14/2021, 2:11 PM

## 2021-03-14 NOTE — H&P (View-Only) (Signed)
     Subjective  - No significant change in left LE edema.   Objective (!) 106/56 (!) 104 98.3 F (36.8 C) (Oral) 18 100%  Intake/Output Summary (Last 24 hours) at 03/14/2021 0752 Last data filed at 03/14/2021 0435 Gross per 24 hour  Intake --  Output 725 ml  Net -725 ml    Palpable AT pulse Pitting edema in left LE No erythema.  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    NPO order in place for MN  Roxy Horseman 03/14/2021 7:52 AM --  Laboratory Lab Results: Recent Labs    03/13/21 1317 03/14/21 0021  WBC 9.4 6.9  HGB 9.8* 8.5*  HCT 27.7* 23.6*  PLT 257 282   BMET Recent Labs    03/13/21 1317 03/13/21 2211  NA 122* 123*  K 4.6 3.9  CL 88* 93*  CO2 22 21*  GLUCOSE 114* 131*  BUN 27* 21  CREATININE 1.22* 1.15*  CALCIUM 8.3* 8.0*    COAG Lab Results  Component Value Date   INR 1.0 03/13/2021   INR 1.0 01/24/2021   INR 1.12 06/08/2017   No results found for: PTT  I have independently interviewed and examined patient and agree with PA assessment and plan above.  Swelling is unchanged.  I discussed proceeding tomorrow with venous mechanical thrombectomy from a bilateral popliteal approach with prone positioning and general anesthesia.  I discussed the risks including pulmonary embolus and risk of injury to nerves.  I have also specifically discussed that some of this may be chronic and unable to be removed.  The bailout procedure will be leaving lytic catheters with return trip to the operating room on Thursday.  Patient demonstrates good understanding will be n.p.o. past midnight.  Wendy [name] C. Donzetta Matters, MD [facility] Office: [phone] Pager: [phone]

## 2021-03-14 NOTE — Progress Notes (Signed)
PROGRESS NOTE    Sibley L. Duggin  MRN:[id number] DOB: 03/09/1946 DOA: 03/13/2021 PCP: Tower, Marne A, MD    Brief Narrative:  75 y/o female with history of amyloidosis, HTN, HLD, hyponatremia, admitted to the hospital with bilateral DVT with extension into IVC. Seen by vascular surgery with plans for mechanical thrombectomy on 3/23. She is on IV heparin.   Assessment & Plan:   Active Problems:   Hyponatremia   Hyperlipidemia   Light chain (AL) amyloidosis (HCC)   DVT (deep venous thrombosis) (HCC)   Compression fracture of T10 vertebra (HCC)   Fall at home, initial encounter   Bilateral lower extremity DVT with extension into IVC -currently on heparin infusion -Vascular surgery following with plans for mechanical thrombectomy on 3/23 -npo after midnight  Hyponatremia -seems to be a chronic issue -she is asymptomatic -clinically, she may be hypervolemic -continue on outpatient dose of bumex -cosyntropin test normal  T10 compression fracture -appears to be a chronic finding, seen by Dr. Nitka in the past and appears to have had a vertebroplasty  HLD -continue on crestor  Amyloidosis -continue follow up with hematology and nephrology  AKI -mild -baseline creatinine 0.7, admission creatinine 77.2 -continue to follow, considering she received IV contrast for CT venogram -holding losartan for now  Anemia -chronic, likely related to chronic disease -baseline hemoglobin around 9.5-10 -no evidence of bleeding at this time -continue to follow hemoglobin in setting of anticoagulation    DVT prophylaxis: heparin infusion   Code Status: full code Family Communication: discussed with patient Disposition Plan: Status is: Inpatient  Remains inpatient appropriate because:IV treatments appropriate due to intensity of illness or inability to take PO   Dispo: The patient is from: Home              Anticipated d/c is to: Home              Patient currently is not medically  stable to d/c.   Difficult to place patient No    Consultants:   Vascular surgery  Procedures:     Antimicrobials:       Subjective: No chest pain or shortness of breath  Objective: Vitals:   03/13/21 2233 03/13/21 2236 03/14/21 0435 03/14/21 0821  BP: (!) 94/59 128/67 (!) 106/56 104/60  Pulse: 99 (!) 105 (!) 104 100  Resp: 18  18 17  Temp: 98.1 F (36.7 C)  98.3 F (36.8 C) 98.5 F (36.9 C)  TempSrc:   Oral Oral  SpO2: 99% 99% 100% 98%  Weight:      Height:        Intake/Output Summary (Last 24 hours) at 03/14/2021 0950 Last data filed at 03/14/2021 0435 Gross per 24 hour  Intake -  Output 725 ml  Net -725 ml   Filed Weights   03/13/21 1236  Weight: 60.8 kg    Examination:  General exam: Appears calm and comfortable  Respiratory system: Clear to auscultation. Respiratory effort normal. Cardiovascular system: S1 & S2 heard, RRR. No JVD, murmurs, rubs, gallops or clicks.  Gastrointestinal system: Abdomen is nondistended, soft and nontender. No organomegaly or masses felt. Normal bowel sounds heard. Central nervous system: Alert and oriented. No focal neurological deficits. Extremities: B/L LE edema L>R Skin: No rashes, lesions or ulcers Psychiatry: Judgement and insight appear normal. Mood & affect appropriate.     Data Reviewed: I have personally reviewed following labs and imaging studies  CBC: Recent Labs  Lab 03/08/21 1941 03/09/21 0800 03/13/21 1317 03/14/21   0021  WBC 6.2 5.8 9.4 6.9  NEUTROABS 5.2 4.8 8.6*  --   HGB 9.9* 10.6* 9.8* 8.5*  HCT 27.7* 29.3* 27.7* 23.6*  MCV 94.9 92.4 96.5 94.8  PLT 234 252 257 282   Basic Metabolic Panel: Recent Labs  Lab 03/08/21 1941 03/09/21 0800 03/13/21 1317 03/13/21 2211  NA 126* 127* 122* 123*  K 4.5 4.4 4.6 3.9  CL 90* 90* 88* 93*  CO2 28 26 22 21*  GLUCOSE 123* 110* 114* 131*  BUN 24* 21 27* 21  CREATININE 1.09* 1.15* 1.22* 1.15*  CALCIUM 9.2 9.2 8.3* 8.0*   GFR: Estimated  Creatinine Clearance: 35.7 mL/min (A) (by C-G formula based on SCr of 1.15 mg/dL (H)). Liver Function Tests: Recent Labs  Lab 03/09/21 0800 03/13/21 1317  AST 27 25  ALT 21 19  ALKPHOS 90 86  BILITOT 0.6 1.2  PROT 6.0* 5.6*  ALBUMIN 2.6* 2.8*   No results for input(s): LIPASE, AMYLASE in the last 168 hours. No results for input(s): AMMONIA in the last 168 hours. Coagulation Profile: Recent Labs  Lab 03/13/21 1432  INR 1.0   Cardiac Enzymes: No results for input(s): CKTOTAL, CKMB, CKMBINDEX, TROPONINI in the last 168 hours. BNP (last 3 results) No results for input(s): PROBNP in the last 8760 hours. HbA1C: No results for input(s): HGBA1C in the last 72 hours. CBG: Recent Labs  Lab 03/13/21 1430  GLUCAP 104*   Lipid Profile: No results for input(s): CHOL, HDL, LDLCALC, TRIG, CHOLHDL, LDLDIRECT in the last 72 hours. Thyroid Function Tests: Recent Labs    03/13/21 2211  TSH 3.924   Anemia Panel: No results for input(s): VITAMINB12, FOLATE, FERRITIN, TIBC, IRON, RETICCTPCT in the last 72 hours. Sepsis Labs: No results for input(s): PROCALCITON, LATICACIDVEN in the last 168 hours.  Recent Results (from the past 240 hour(s))  Resp Panel by RT-PCR (Flu A&B, Covid) Nasopharyngeal Swab     Status: None   Collection Time: 03/13/21  4:35 PM   Specimen: Nasopharyngeal Swab; Nasopharyngeal(NP) swabs in vial transport medium  Result Value Ref Range Status   SARS Coronavirus 2 by RT PCR NEGATIVE NEGATIVE Final    Comment: (NOTE) SARS-CoV-2 target nucleic acids are NOT DETECTED.  The SARS-CoV-2 RNA is generally detectable in upper respiratory specimens during the acute phase of infection. The lowest concentration of SARS-CoV-2 viral copies this assay can detect is 138 copies/mL. A negative result does not preclude SARS-Cov-2 infection and should not be used as the sole basis for treatment or other patient management decisions. A negative result may occur with  improper  specimen collection/handling, submission of specimen other than nasopharyngeal swab, presence of viral mutation(s) within the areas targeted by this assay, and inadequate number of viral copies(<138 copies/mL). A negative result must be combined with clinical observations, patient history, and epidemiological information. The expected result is Negative.  Fact Sheet for Patients:  https://www.fda.gov/media/152166/download  Fact Sheet for Healthcare Providers:  https://www.fda.gov/media/152162/download  This test is no t yet approved or cleared by the [place] FDA and  has been authorized for detection and/or diagnosis of SARS-CoV-2 by FDA under an Emergency Use Authorization (EUA). This EUA will remain  in effect (meaning this test can be used) for the duration of the COVID-19 declaration under Section 564(b)(1) of the Act, 21 U.S.C.section 360bbb-3(b)(1), unless the authorization is terminated  or revoked sooner.       Influenza A by PCR NEGATIVE NEGATIVE Final   Influenza B by PCR NEGATIVE NEGATIVE Final      Comment: (NOTE) The Xpert Xpress SARS-CoV-2/FLU/RSV plus assay is intended as an aid in the diagnosis of influenza from Nasopharyngeal swab specimens and should not be used as a sole basis for treatment. Nasal washings and aspirates are unacceptable for Xpert Xpress SARS-CoV-2/FLU/RSV testing.  Fact Sheet for Patients: EntrepreneurPulse.com.au  Fact Sheet for Healthcare Providers: IncredibleEmployment.be  This test is not yet approved or cleared by the [place] FDA and has been authorized for detection and/or diagnosis of SARS-CoV-2 by FDA under an Emergency Use Authorization (EUA). This EUA will remain in effect (meaning this test can be used) for the duration of the COVID-19 declaration under Section 564(b)(1) of the Act, 21 U.S.C. section 360bbb-3(b)(1), unless the authorization is terminated or revoked.  Performed at  [facility], [place] [address], [place], [place] [address]   Surgical pcr screen     Status: None   Collection Time: 03/13/21  9:54 PM   Specimen: Nasal Mucosa; Nasal Swab  Result Value Ref Range Status   MRSA, PCR NEGATIVE NEGATIVE Final   Staphylococcus aureus NEGATIVE NEGATIVE Final    Comment: (NOTE) The Xpert SA Assay (FDA approved for NASAL specimens in patients 31 years of age and older), is one component of a comprehensive surveillance program. It is not intended to diagnose infection nor to guide or monitor treatment. Performed at [facility], [place] [address], [place], [place] [address]          Radiology Studies: DG Lumbar Spine Complete  Result Date: 03/13/2021 CLINICAL DATA:  Low back pain and weakness since last chemotherapy. History of multiple myeloma. EXAM: LUMBAR SPINE - COMPLETE 4+ VIEW COMPARISON:  05/11/2016, without report. FINDINGS: Advanced osteopenia. Five lumbar type vertebral bodies. Minimal convex left lumbar spine curvature. Status post vertebral augmentation at T12 through L3, similar to 2017. Cement within paravertebral veins on the right is similar. Severe T10 compression deformity is only readily apparent on the AP image, likely new compared 04/24/1916. Aortic atherosclerosis. IMPRESSION: Status post T12 through L3 vertebral augmentation, grossly similar. Severe T10 compression deformity, suboptimally evaluated but likely new compared to 2017. Consider dedicated thoracic spine radiographs. Electronically Signed   By: Abigail Miyamoto M.D.   On: 03/13/2021 14:01   CT VENOGRAM ABD/PEL  Addendum Date: 03/13/2021   ADDENDUM REPORT: 03/13/2021 19:00 ADDENDUM: These results were called by telephone at the time of interpretation on 03/13/2021 at 6:59 pm to provider ABIGAIL HARRIS , who verbally acknowledged these results. Electronically Signed   By: Zetta Bills M.D.   On: 03/13/2021 19:00   Result Date: 03/13/2021 CLINICAL DATA:   75 year old female with reported history of deep venous thrombosis. EXAM: CT ABDOMEN AND PELVIS WITH CONTRAST TECHNIQUE: Multidetector CT imaging of the abdomen and pelvis was performed using the standard protocol following bolus administration of intravenous contrast. CONTRAST:  127m OMNIPAQUE IOHEXOL 350 MG/ML SOLN COMPARISON:  March 10, 2020 FINDINGS: Lower chest: Stable small pulmonary nodule on image 21 of series 7 approximately 4 mm in the RIGHT lower lobe. Other scattered small nodules with similar appearance in the lung bases. No effusion. No consolidation. Hepatobiliary: No focal, suspicious hepatic lesion. Variable hepatic steatosis. Mild biliary duct distension following cholecystectomy unchanged since previous imaging. Pancreas: Normal, without mass, inflammation or ductal dilatation. Spleen: Spleen normal size and contour. Adrenals/Urinary Tract: Adrenal glands are normal. Symmetric renal enhancement. No hydronephrosis. No suspicious renal lesion. Cyst in the LEFT kidney and smaller cysts throughout the kidneys. Largest on the LEFT posteriorly measuring approximately 1.8 cm.  Urinary bladder with smooth contours. Stomach/Bowel: No acute gastrointestinal process involving stomach or small bowel. Appendix not visualized though no secondary signs of acute appendicitis. Colonic diverticulosis of the sigmoid colon. Mild pericolonic and perirectal stranding at the rectosigmoid junction. 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 The fact relate from previous vertebroplasty extending into the IVC lumen is unchanged, a chronic finding. This passes through the thrombus in the lumen. Bilateral iliac thrombus with expansion of the inferior IVC and iliac veins and stranding about the venous systems in the pelvis and generalized congestive changes suspected throughout the pelvis. Thrombus on the LEFT extends into the femoral veins and there is  asymmetric lower extremity edema associated with this finding. Calcified and noncalcified atheromatous plaque of the abdominal aorta. No aneurysmal dilation. No abdominal lymphadenopathy. No pelvic lymphadenopathy Reproductive: Post hysterectomy.  No adnexal masses. Other: Body wall edema.  No ascites. Musculoskeletal: Spinal degenerative changes. No acute or destructive bone process. Evidence of prior vertebroplasty at T12 through L3 and associated loss of height at these levels with similar appearance to prior imaging. IMPRESSION: 1. Occlusive thrombus expanding in the inferior vena cava below the renal veins extending through the femoral vein on the LEFT and the external iliac vein on the RIGHT. Also involving internal iliac vein on the LEFT. 2. Colonic diverticulosis with mild stranding about the rectosigmoid junction, potentially related to edema in the setting of extensive deep venous thrombosis. Mild diverticulitis is not excluded though not favored. Correlate with symptoms. 3. Variable hepatic steatosis. 4. Stable small pulmonary nodule in the lung bases. 5. Evidence of prior vertebroplasty at T12 through L3 and associated loss of height at these levels with similar appearance to prior imaging. 6. Aortic atherosclerosis. Aortic Atherosclerosis (ICD10-I70.0). Electronically Signed: By: Geoffrey  Wile M.D. On: 03/13/2021 18:55   VAS US LOWER EXTREMITY VENOUS (DVT) (ONLY [facility] & [facility])  Result Date: 03/13/2021  Lower Venous DVT Study Indications: LLE edema / weakness.  Risk Factors: Cancer patient - on chemo. Comparison Study: No previous exams Performing Technologist: Jody Hill  Examination Guidelines: A complete evaluation includes B-mode imaging, spectral Doppler, color Doppler, and power Doppler as needed of all accessible portions of each vessel. Bilateral testing is considered an integral part of a complete examination. Limited examinations for reoccurring indications may be performed as noted. The reflux  portion of the exam is performed with the patient in reverse Trendelenburg.  +-----+---------------+---------+-----------+----------+-----------------------+ RIGHTCompressibilityPhasicitySpontaneityPropertiesThrombus Aging          +-----+---------------+---------+-----------+----------+-----------------------+ CFV  Full           Yes      Yes                                          +-----+---------------+---------+-----------+----------+-----------------------+ EIV  Partial        Yes      Yes                  Proximal EIV non                                                          compressible with no                                                        color. Distal EIV                                                         patent.                 +-----+---------------+---------+-----------+----------+-----------------------+ CIV  None           No       No                   Not well visualized     +-----+---------------+---------+-----------+----------+-----------------------+   +---------+---------------+---------+-----------+----------+-------------------+ LEFT     CompressibilityPhasicitySpontaneityPropertiesThrombus Aging      +---------+---------------+---------+-----------+----------+-------------------+ CFV      None           No       No                   Age Indeterminate   +---------+---------------+---------+-----------+----------+-------------------+ FV Prox  None           No       No                   Acute               +---------+---------------+---------+-----------+----------+-------------------+ FV Mid   None           No       No                   Acute               +---------+---------------+---------+-----------+----------+-------------------+ FV DistalNone           No       No                   Acute               +---------+---------------+---------+-----------+----------+-------------------+ PFV       None           No       No                   Acute               +---------+---------------+---------+-----------+----------+-------------------+ POP      Full           Yes      Yes                  Rouleaux flow       +---------+---------------+---------+-----------+----------+-------------------+ PTV      Full                                                             +---------+---------------+---------+-----------+----------+-------------------+ PERO     Full                                                             +---------+---------------+---------+-----------+----------+-------------------+  GSV      None           No       No                   Acute SVT - origin                                                        to knee             +---------+---------------+---------+-----------+----------+-------------------+ EIV      None                                                             +---------+---------------+---------+-----------+----------+-------------------+ CIV      None                                                             +---------+---------------+---------+-----------+----------+-------------------+     Summary: RIGHT: - No evidence of common femoral vein obstruction. - Findings consistent with acute deep vein thrombosis involving the CIV and Proximal EIV.  LEFT: - Findings consistent with acute deep vein thrombosis involving the left femoral vein, and left proximal profunda vein. - Findings consistent with acute superficial vein thrombosis involving the left great saphenous vein. - Findings consistent with age indeterminate deep vein thrombosis involving the left common femoral vein, and SF junction. - No cystic structure found in the popliteal fossa. - IVC thrombosed from mid portion to bifurcation.  *See table(s) above for measurements and observations. Electronically signed by Brandon Cain MD on 03/13/2021 at 10:18:53 PM.     Final         Scheduled Meds: . acyclovir  400 mg Oral BID  . cholecalciferol  2,000 Units Oral Daily  . losartan  100 mg Oral Daily  . melatonin  10 mg Oral QHS  . rosuvastatin  10 mg Oral Daily  . zolpidem  5 mg Oral QHS   Continuous Infusions: . heparin 1,000 Units/hr (03/13/21 1602)     LOS: 1 day    Time spent: 35mins    Jehanzeb Memon, MD [facility]   If 7PM-7AM, please contact night-coverage [email or web address]  03/14/2021, 9:50 AM   

## 2021-03-14 NOTE — Progress Notes (Signed)
[facility] for Heparin Indication: acute  DVT 03/13/21  Allergies  Allergen Reactions  . Bentyl [Dicyclomine Hcl]     Groggy, blurred vision  . Butalbital-Aspirin-Caffeine Other (See Comments)    hallucinations  . Clonidine Derivatives     dizziness, lightheadedness, abdominal cramping, dry mouth/throat  . Linzess [Linaclotide] Diarrhea  . Morphine And Related Other (See Comments)    Does not work  . Motrin [Ibuprofen]     GI upset  . Penicillins Other (See Comments)    As child; reaction unknown Has patient had a PCN reaction causing immediate rash, facial/tongue/throat swelling, SOB or lightheadedness with hypotension: No Has patient had a PCN reaction causing severe rash involving mucus membranes or skin necrosis: No Has patient had a PCN reaction that required hospitalization: No Has patient had a PCN reaction occurring within the last 10 years: No If all of the above answers are "NO", then may proceed with Cephalosporin use.  . Sulfa Antibiotics     In childhood  . Zanaflex [Tizanidine Hcl] Other (See Comments)    Decreased BP  . Diphenhydramine Hcl Palpitations    restlessness    Patient Measurements: Height: 5\' 2"  (157.5 cm) Weight: 60.8 kg (134 lb) IBW/kg (Calculated) : 50.1 Heparin Dosing Weight: 61kg  Vital Signs: Temp: 98.5 F (36.9 C) (03/22 0821) Temp Source: Oral (03/22 0821) BP: 104/60 (03/22 0821) Pulse Rate: 100 (03/22 0821)  Labs: Recent Labs    03/13/21 1317 03/13/21 1432 03/13/21 2211 03/14/21 0021  HGB 9.8*  --   --  8.5*  HCT 27.7*  --   --  23.6*  PLT 257  --   --  282  APTT  --  34  --   --   LABPROT  --  12.5  --   --   INR  --  1.0  --   --   HEPARINUNFRC  --  <0.10*  --  0.35  CREATININE 1.22*  --  1.15*  --     Estimated Creatinine Clearance: 35.7 mL/min (A) (by C-G formula based on SCr of 1.15 mg/dL (H)).   Assessment: 75 y/o F with acute DVT in LLE femoral vein, RLE external iliac vein  and IVC 3/21. No anticoagulation prior to admission. Pharmacy consulted for heparin.    HL 0.33 still therapeutic on low end. Mechanical thrombectomy planned for LLE 3/23. Will increase rate slightly to target higher end of goal given extensive thrombus.   Goal of Therapy:  Heparin level 0.3-0.7 units/ml Monitor platelets by anticoagulation protocol: Yes   Plan:  Increase heparin infusion to 1050 units/hr Monitor daily HL, CBC/plt Monitor for signs/symptoms of bleeding  F/u PO anticoagulation plan  Benetta Spar, PharmD, BCPS, BCCP Clinical Pharmacist  Please check AMION for all [facility] phone numbers After 10:00 PM, call [place]

## 2021-03-14 NOTE — Telephone Encounter (Signed)
Patient called office - LVM stating she was in the hospital and would not be here for appts on Thursday. Stated she had called before - but no return call.  Called patient and LVM that her message was received and that someone will contact her tomorrow.

## 2021-03-15 ENCOUNTER — Encounter (HOSPITAL_COMMUNITY)
Admission: EM | Disposition: A | Discharge status: Home Health | Payer: Self-pay | Source: Home / Self Care | Attending: Internal Medicine

## 2021-03-15 ENCOUNTER — Encounter (HOSPITAL_COMMUNITY): Payer: Self-pay | Admitting: Internal Medicine

## 2021-03-15 ENCOUNTER — Inpatient Hospital Stay (HOSPITAL_COMMUNITY): Payer: Medicare Other | Admitting: Certified Registered"

## 2021-03-15 ENCOUNTER — Inpatient Hospital Stay (HOSPITAL_COMMUNITY): Payer: Medicare Other

## 2021-03-15 DIAGNOSIS — N179 Acute kidney failure, unspecified: Secondary | ICD-10-CM | POA: Diagnosis not present

## 2021-03-15 DIAGNOSIS — I824Z3 Acute embolism and thrombosis of unspecified deep veins of distal lower extremity, bilateral: Secondary | ICD-10-CM | POA: Diagnosis not present

## 2021-03-15 DIAGNOSIS — I824Y2 Acute embolism and thrombosis of unspecified deep veins of left proximal lower extremity: Secondary | ICD-10-CM | POA: Diagnosis not present

## 2021-03-15 DIAGNOSIS — E871 Hypo-osmolality and hyponatremia: Secondary | ICD-10-CM | POA: Diagnosis not present

## 2021-03-15 HISTORY — PX: PERCUTANEOUS VENOUS THROMBECTOMY,LYSIS WITH INTRAVASCULAR ULTRASOUND (IVUS): SHX6751

## 2021-03-15 LAB — CBC
HCT: 22.6 % — ABNORMAL LOW (ref 36.0–46.0)
Hemoglobin: 8 g/dL — ABNORMAL LOW (ref 12.0–15.0)
MCH: 34 pg (ref 26.0–34.0)
MCHC: 35.4 g/dL (ref 30.0–36.0)
MCV: 96.2 fL (ref 80.0–100.0)
Platelets: 316 10*3/uL (ref 150–400)
RBC: 2.35 MIL/uL — ABNORMAL LOW (ref 3.87–5.11)
RDW: 13.8 % (ref 11.5–15.5)
WBC: 4.5 10*3/uL (ref 4.0–10.5)
nRBC: 0 % (ref 0.0–0.2)

## 2021-03-15 LAB — BASIC METABOLIC PANEL
Anion gap: 7 (ref 5–15)
BUN: 17 mg/dL (ref 8–23)
CO2: 25 mmol/L (ref 22–32)
Calcium: 8.1 mg/dL — ABNORMAL LOW (ref 8.9–10.3)
Chloride: 92 mmol/L — ABNORMAL LOW (ref 98–111)
Creatinine, Ser: 1.14 mg/dL — ABNORMAL HIGH (ref 0.44–1.00)
GFR, Estimated: 50 mL/min — ABNORMAL LOW (ref >60)
Glucose, Bld: 104 mg/dL — ABNORMAL HIGH (ref 70–99)
Potassium: 3.8 mmol/L (ref 3.5–5.1)
Sodium: 124 mmol/L — ABNORMAL LOW (ref 135–145)

## 2021-03-15 LAB — HEPARIN LEVEL (UNFRACTIONATED): Heparin Unfractionated: 0.32 IU/mL (ref 0.30–0.70)

## 2021-03-15 LAB — PREPARE RBC (CROSSMATCH)

## 2021-03-15 SURGERY — THROMBECTOMY, VEIN, PERCUTANEOUS
Anesthesia: General | Laterality: Bilateral

## 2021-03-15 MED ORDER — AMISULPRIDE (ANTIEMETIC) 5 MG/2ML IV SOLN: 5.0000 mg | Freq: Once | INTRAVENOUS | Status: DC | PRN

## 2021-03-15 MED ORDER — DEXAMETHASONE SODIUM PHOSPHATE 10 MG/ML IJ SOLN
INTRAMUSCULAR | Status: DC | PRN
  Administered 2021-03-15: 8 mg via INTRAVENOUS

## 2021-03-15 MED ORDER — SUGAMMADEX SODIUM 200 MG/2ML IV SOLN
INTRAVENOUS | Status: DC | PRN
  Administered 2021-03-15: 200 mg via INTRAVENOUS

## 2021-03-15 MED ORDER — AMISULPRIDE (ANTIEMETIC) 5 MG/2ML IV SOLN
INTRAVENOUS | Status: DC | PRN
  Administered 2021-03-15: 5 mg via INTRAVENOUS

## 2021-03-15 MED ORDER — ESMOLOL HCL 100 MG/10ML IV SOLN
INTRAVENOUS | Status: DC | PRN
  Administered 2021-03-15: 30 mg via INTRAVENOUS

## 2021-03-15 MED ORDER — ESMOLOL HCL 100 MG/10ML IV SOLN
INTRAVENOUS | Status: AC
  Filled 2021-03-15: qty 10

## 2021-03-15 MED ORDER — IODIXANOL 320 MG/ML IV SOLN
INTRAVENOUS | Status: DC | PRN
  Administered 2021-03-15: 43 mL

## 2021-03-15 MED ORDER — ALBUMIN HUMAN 5 % IV SOLN: INTRAVENOUS | Status: DC | PRN

## 2021-03-15 MED ORDER — FENTANYL CITRATE (PF) 100 MCG/2ML IJ SOLN: 25.0000 ug | INTRAMUSCULAR | Status: DC | PRN

## 2021-03-15 MED ORDER — HEPARIN SODIUM (PORCINE) 1000 UNIT/ML IJ SOLN
INTRAMUSCULAR | Status: DC | PRN
  Administered 2021-03-15 (×2): 5000 [IU] via INTRAVENOUS

## 2021-03-15 MED ORDER — PHENYLEPHRINE 40 MCG/ML (10ML) SYRINGE FOR IV PUSH (FOR BLOOD PRESSURE SUPPORT)
PREFILLED_SYRINGE | INTRAVENOUS | Status: DC | PRN
  Administered 2021-03-15: 80 ug via INTRAVENOUS
  Administered 2021-03-15 (×2): 120 ug via INTRAVENOUS

## 2021-03-15 MED ORDER — SODIUM CHLORIDE 0.9% IV SOLUTION: Freq: Once | INTRAVENOUS | Status: DC

## 2021-03-15 MED ORDER — ROCURONIUM BROMIDE 10 MG/ML (PF) SYRINGE
PREFILLED_SYRINGE | INTRAVENOUS | Status: DC | PRN
  Administered 2021-03-15: 30 mg via INTRAVENOUS
  Administered 2021-03-15: 40 mg via INTRAVENOUS
  Administered 2021-03-15: 30 mg via INTRAVENOUS

## 2021-03-15 MED ORDER — PROTAMINE SULFATE 10 MG/ML IV SOLN
INTRAVENOUS | Status: AC
  Filled 2021-03-15: qty 5

## 2021-03-15 MED ORDER — ONDANSETRON HCL 4 MG/2ML IJ SOLN: 4.0000 mg | Freq: Once | INTRAMUSCULAR | Status: DC | PRN

## 2021-03-15 MED ORDER — PROPOFOL 10 MG/ML IV BOLUS
INTRAVENOUS | Status: AC
  Filled 2021-03-15: qty 20

## 2021-03-15 MED ORDER — ROCURONIUM BROMIDE 10 MG/ML (PF) SYRINGE
PREFILLED_SYRINGE | INTRAVENOUS | Status: AC
  Filled 2021-03-15: qty 10

## 2021-03-15 MED ORDER — SODIUM CHLORIDE 0.9 % IV SOLN
INTRAVENOUS | Status: AC
  Filled 2021-03-15: qty 1.2

## 2021-03-15 MED ORDER — 0.9 % SODIUM CHLORIDE (POUR BTL) OPTIME
TOPICAL | Status: DC | PRN
  Administered 2021-03-15: 1000 mL

## 2021-03-15 MED ORDER — LIDOCAINE 2% (20 MG/ML) 5 ML SYRINGE
INTRAMUSCULAR | Status: AC
  Filled 2021-03-15: qty 5

## 2021-03-15 MED ORDER — ONDANSETRON HCL 4 MG/2ML IJ SOLN
INTRAMUSCULAR | Status: AC
  Filled 2021-03-15: qty 2

## 2021-03-15 MED ORDER — PROPOFOL 10 MG/ML IV BOLUS
INTRAVENOUS | Status: DC | PRN
  Administered 2021-03-15: 100 mg via INTRAVENOUS

## 2021-03-15 MED ORDER — DEXMEDETOMIDINE (PRECEDEX) IN NS 20 MCG/5ML (4 MCG/ML) IV SYRINGE
PREFILLED_SYRINGE | INTRAVENOUS | Status: DC | PRN
  Administered 2021-03-15: 6 ug via INTRAVENOUS

## 2021-03-15 MED ORDER — CHLORHEXIDINE GLUCONATE 0.12 % MT SOLN
15.0000 mL | OROMUCOSAL | Status: DC
  Filled 2021-03-15: qty 15

## 2021-03-15 MED ORDER — AMISULPRIDE (ANTIEMETIC) 5 MG/2ML IV SOLN: 5.0000 mg | Freq: Once | INTRAVENOUS | Status: DC

## 2021-03-15 MED ORDER — PHENYLEPHRINE 40 MCG/ML (10ML) SYRINGE FOR IV PUSH (FOR BLOOD PRESSURE SUPPORT)
PREFILLED_SYRINGE | INTRAVENOUS | Status: AC
  Filled 2021-03-15: qty 10

## 2021-03-15 MED ORDER — LIDOCAINE 2% (20 MG/ML) 5 ML SYRINGE
INTRAMUSCULAR | Status: DC | PRN
  Administered 2021-03-15: 60 mg via INTRAVENOUS

## 2021-03-15 MED ORDER — VANCOMYCIN HCL IN DEXTROSE 1-5 GM/200ML-% IV SOLN: 1000.0000 mg | Freq: Once | INTRAVENOUS | Status: AC

## 2021-03-15 MED ORDER — AMISULPRIDE (ANTIEMETIC) 5 MG/2ML IV SOLN
INTRAVENOUS | Status: AC
  Filled 2021-03-15: qty 2

## 2021-03-15 MED ORDER — DEXAMETHASONE SODIUM PHOSPHATE 10 MG/ML IJ SOLN
INTRAMUSCULAR | Status: AC
  Filled 2021-03-15: qty 1

## 2021-03-15 MED ORDER — CHLORHEXIDINE GLUCONATE 0.12 % MT SOLN
OROMUCOSAL | Status: AC
  Filled 2021-03-15: qty 15

## 2021-03-15 MED ORDER — PROPOFOL 500 MG/50ML IV EMUL
INTRAVENOUS | Status: DC | PRN
  Administered 2021-03-15: 20 ug/kg/min via INTRAVENOUS

## 2021-03-15 MED ORDER — SODIUM CHLORIDE 0.9 % IV SOLN
INTRAVENOUS | Status: DC | PRN
  Administered 2021-03-15 (×2): 500 mL

## 2021-03-15 MED ORDER — FENTANYL CITRATE (PF) 250 MCG/5ML IJ SOLN
INTRAMUSCULAR | Status: AC
  Filled 2021-03-15: qty 5

## 2021-03-15 MED ORDER — ACETAMINOPHEN 325 MG PO TABS
650.0000 mg | ORAL_TABLET | Freq: Once | ORAL | Status: AC
  Administered 2021-03-15: 650 mg via ORAL
  Filled 2021-03-15: qty 2

## 2021-03-15 MED ORDER — VANCOMYCIN HCL IN DEXTROSE 1-5 GM/200ML-% IV SOLN
INTRAVENOUS | Status: AC
  Administered 2021-03-15: 1000 mg via INTRAVENOUS
  Filled 2021-03-15: qty 200

## 2021-03-15 MED ORDER — ACETAMINOPHEN 10 MG/ML IV SOLN: 900.0000 mg | Freq: Once | INTRAVENOUS | Status: DC | PRN

## 2021-03-15 MED ORDER — ONDANSETRON HCL 4 MG/2ML IJ SOLN
INTRAMUSCULAR | Status: DC | PRN
  Administered 2021-03-15: 4 mg via INTRAVENOUS

## 2021-03-15 MED ORDER — PHENYLEPHRINE HCL-NACL 10-0.9 MG/250ML-% IV SOLN
INTRAVENOUS | Status: DC | PRN
  Administered 2021-03-15: 50 ug/min via INTRAVENOUS

## 2021-03-15 MED ORDER — LACTATED RINGERS IV SOLN: INTRAVENOUS | Status: DC

## 2021-03-15 SURGICAL SUPPLY — 65 items
ADH SKN CLS APL DERMABOND .7 (GAUZE/BANDAGES/DRESSINGS) ×1
APL PRP STRL LF DISP 70% ISPRP (MISCELLANEOUS) ×1
BAG SNAP BAND KOVER 36X36 (MISCELLANEOUS) ×2 IMPLANT
BALLN MUSTANG 10X60X75 (BALLOONS) ×2
BALLOON MUSTANG 10X60X75 (BALLOONS) IMPLANT
BLADE SURG 11 STRL SS (BLADE) ×2 IMPLANT
CANISTER SUCT 3000ML PPV (MISCELLANEOUS) ×2 IMPLANT
CATH ANGIO 5F BER2 100CM (CATHETERS) ×1 IMPLANT
CATH ANGIO 5F BER2 65CM (CATHETERS) ×1 IMPLANT
CATH OMNI FLUSH 5F 65CM (CATHETERS) ×2 IMPLANT
CATH ROBINSON RED A/P 18FR (CATHETERS) ×1 IMPLANT
CATH VISIONS PV .035 IVUS (CATHETERS) ×1 IMPLANT
CHLORAPREP W/TINT 26 (MISCELLANEOUS) ×2 IMPLANT
COVER BACK TABLE 80X110 HD (DRAPES) ×4 IMPLANT
COVER DOME SNAP 22 D (MISCELLANEOUS) ×2 IMPLANT
COVER PROBE W GEL 5X96 (DRAPES) ×2 IMPLANT
COVER SURGICAL LIGHT HANDLE (MISCELLANEOUS) ×3 IMPLANT
COVER WAND RF STERILE (DRAPES) ×2 IMPLANT
DERMABOND ADVANCED (GAUZE/BANDAGES/DRESSINGS) ×1
DERMABOND ADVANCED .7 DNX12 (GAUZE/BANDAGES/DRESSINGS) ×1 IMPLANT
DEVICE INFLATION ENCORE 26 (MISCELLANEOUS) ×1 IMPLANT
DEVICE TORQUE KENDALL .025-038 (MISCELLANEOUS) IMPLANT
DRAPE FEMORAL ANGIO 80X135IN (DRAPES) ×2 IMPLANT
ELECT REM PT RETURN 9FT ADLT (ELECTROSURGICAL)
ELECTRODE REM PT RTRN 9FT ADLT (ELECTROSURGICAL) IMPLANT
FILTER CO2 0.2 MICRON (VASCULAR PRODUCTS) IMPLANT
FILTER CO2 INSUFFLATOR AX1008 (MISCELLANEOUS) IMPLANT
GAUZE 4X4 16PLY RFD (DISPOSABLE) ×2 IMPLANT
GLIDEWIRE ADV .035X260CM (WIRE) ×1 IMPLANT
GLOVE BIO SURGEON STRL SZ7.5 (GLOVE) ×2 IMPLANT
GOWN STRL REUS W/ TWL LRG LVL3 (GOWN DISPOSABLE) ×2 IMPLANT
GOWN STRL REUS W/ TWL XL LVL3 (GOWN DISPOSABLE) ×1 IMPLANT
GOWN STRL REUS W/TWL LRG LVL3 (GOWN DISPOSABLE) ×4
GOWN STRL REUS W/TWL XL LVL3 (GOWN DISPOSABLE) ×2
GUIDEWIRE ANGLED .035X150CM (WIRE) IMPLANT
KIT BASIN OR (CUSTOM PROCEDURE TRAY) ×2 IMPLANT
KIT MICROPUNCTURE NIT STIFF (SHEATH) ×1 IMPLANT
KIT TURNOVER KIT B (KITS) ×2 IMPLANT
NDL PERC 18GX7CM (NEEDLE) ×1 IMPLANT
NEEDLE PERC 18GX7CM (NEEDLE) ×2 IMPLANT
NS IRRIG 1000ML POUR BTL (IV SOLUTION) IMPLANT
PAD ARMBOARD 7.5X6 YLW CONV (MISCELLANEOUS) ×4 IMPLANT
SET FLUSH CO2 (MISCELLANEOUS) IMPLANT
SET MICROPUNCTURE 5F STIFF (MISCELLANEOUS) IMPLANT
SHEATH PINNACLE 5F 10CM (SHEATH) ×1 IMPLANT
SHEATH PINNACLE 8F 10CM (SHEATH) ×2 IMPLANT
SLEEVE ISOL F/PACE RF HD COVER (MISCELLANEOUS) ×1 IMPLANT
SNARE GOOSENECK 15MM (MISCELLANEOUS) ×1 IMPLANT
STOPCOCK MORSE 400PSI 3WAY (MISCELLANEOUS) ×2 IMPLANT
SUT SILK  1 MH (SUTURE) ×1
SUT SILK 1 MH (SUTURE) IMPLANT
SUT SILK 2 0 SH (SUTURE) ×1 IMPLANT
SYR 10ML LL (SYRINGE) ×8 IMPLANT
SYR 20ML LL LF (SYRINGE) ×4 IMPLANT
SYR 30ML LL (SYRINGE) ×2 IMPLANT
SYR BULB IRRIG 60ML STRL (SYRINGE) ×1 IMPLANT
SYR MEDRAD MARK V 150ML (SYRINGE) IMPLANT
TOWEL GREEN STERILE (TOWEL DISPOSABLE) ×4 IMPLANT
TUBING HIGH PRESSURE 120CM (CONNECTOR) IMPLANT
UNDERPAD 30X36 HEAVY ABSORB (UNDERPADS AND DIAPERS) IMPLANT
WATER STERILE IRR 1000ML POUR (IV SOLUTION) IMPLANT
WIRE AMPLATZ SS-J .035X260CM (WIRE) ×1 IMPLANT
WIRE BENTSON .035X145CM (WIRE) ×3 IMPLANT
WIRE STARTER BENTSON 035X150 (WIRE) ×1 IMPLANT
WIRE TORQFLEX AUST .018X40CM (WIRE) ×2 IMPLANT

## 2021-03-15 NOTE — Interval H&P Note (Signed)
History and Physical Interval Note:  03/15/2021 2:32 PM  Wendy [name]  has presented today for surgery, with the diagnosis of DVT.  The various methods of treatment have been discussed with the patient and family. After consideration of risks, benefits and other options for treatment, the patient has consented to  Procedure(s) with comments: PERCUTANEOUS VENOUS THROMBECTOMY AND LYSIS WITH INTRAVASCULAR ULTRASOUND (IVUS) (Bilateral) - PRONE POSITION as a surgical intervention.  The patient's history has been reviewed, patient examined, no change in status, stable for surgery.  I have reviewed the patient's chart and labs.  Questions were answered to the patient's satisfaction.     Servando Snare

## 2021-03-15 NOTE — Progress Notes (Signed)
Wendy [name]  HLK:[id number] DOB: 10-29-1946 DOA: 03/13/2021 PCP: Abner Greenspan, MD    Brief Narrative:  75 year old with a history of amyloidosis, HTN, HLD, and hyponatremia who was admitted after she was found to have bilateral lower extremity DVTs extending all the way into the inferior vena cava.  Vascular surgery is following her in consultation.  Antimicrobials:  Vancomycin 3/23  DVT prophylaxis: IV heparin  Consultants:  none  Subjective: Resting comfortably in bed.  About to be transported down to the vascular lab for her procedure.  No new complaints today.  Denies shortness of breath or chest pain.  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   Difficult to place patient No    Objective: Blood pressure 115/68, pulse 98, temperature 98.2 F (36.8 C), temperature source Oral, resp. rate 18, height 5\' 2"  (1.575 m), weight 60.8 kg, SpO2 98 %.  Intake/Output Summary (Last 24 hours) at 03/15/2021 0933 Last data filed at 03/15/2021 0543 Gross per 24 hour  Intake 635.96 ml  Output 200 ml  Net  435.96 ml   Filed Weights   03/13/21 1236  Weight: 60.8 kg    Examination: General: No acute respiratory distress Lungs: Clear to auscultation  Cardiovascular: Regular rate and rhythm  Abdomen: Nondistended Extremities: No significant edema bilateral lower extremities  CBC: Recent Labs  Lab 03/08/21 1941 03/09/21 0800 03/13/21 1317 03/14/21 0021 03/15/21 0341  WBC 6.2 5.8 9.4 6.9 4.5  NEUTROABS 5.2 4.8 8.6*  --   --   HGB 9.9* 10.6* 9.8* 8.5* 8.0*  HCT 27.7* 29.3* 27.7* 23.6* 22.6*  MCV 94.9 92.4 96.5 94.8 96.2  PLT 234 252 257 282 734   Basic Metabolic Panel: Recent Labs  Lab 03/13/21 1317 03/13/21 2211 03/15/21 0341  NA 122* 123* 124*  K 4.6 3.9 3.8  CL 88* 93* 92*  CO2 22 21* 25  GLUCOSE 114* 131* 104*  BUN 27* 21 17  CREATININE 1.22* 1.15* 1.14*  CALCIUM 8.3* 8.0* 8.1*   GFR: Estimated Creatinine Clearance: 36.1 mL/min (A) (by C-G formula based on SCr of 1.14 mg/dL (H)).  Liver Function Tests: Recent Labs  Lab 03/09/21 0800 03/13/21 1317  AST 27 25  ALT 21 19  ALKPHOS 90 86  BILITOT 0.6 1.2  PROT 6.0* 5.6*  ALBUMIN 2.6* 2.8*    Coagulation Profile: Recent Labs  Lab 03/13/21 1432  INR 1.0     HbA1C: Hgb A1c MFr Bld  Date/Time Value Ref Range Status  10/13/2020 08:00 AM 5.6 4.6 - 6.5 % Final    Comment:  Glycemic Control Guidelines for People with Diabetes:Non Diabetic:  <6%Goal of Therapy: <7%Additional Action Suggested:  >8%   10/12/2019 08:12 AM 5.5 4.6 - 6.5 % Final    Comment:    Glycemic Control Guidelines for People with Diabetes:Non Diabetic:  <6%Goal of Therapy: <7%Additional Action Suggested:  >8%     CBG: Recent Labs  Lab 03/13/21 1430  GLUCAP 104*    Recent Results (from the past 240 hour(s))  Resp Panel by RT-PCR (Flu A&B, Covid) Nasopharyngeal Swab     Status: None   Collection Time: 03/13/21  4:35 PM   Specimen: Nasopharyngeal Swab; Nasopharyngeal(NP) swabs in vial transport medium  Result Value Ref Range  Status   SARS Coronavirus 2 by RT PCR NEGATIVE NEGATIVE Final    Comment: (NOTE) SARS-CoV-2 target nucleic acids are NOT DETECTED.  The SARS-CoV-2 RNA is generally detectable in upper respiratory specimens during the acute phase of infection. The lowest concentration of SARS-CoV-2 viral copies this assay can detect is 138 copies/mL. A negative result does not preclude SARS-Cov-2 infection and should not be used as the sole basis for treatment or other patient management decisions. A negative result may occur with  improper specimen collection/handling, submission of specimen other than nasopharyngeal swab, presence of viral mutation(s) within the areas targeted by this assay, and inadequate number of viral copies(<138 copies/mL). A negative result must be combined with clinical observations, patient history, and epidemiological information. The expected result is Negative.  Fact Sheet for Patients:  EntrepreneurPulse.com.au  Fact Sheet for Healthcare Providers:  IncredibleEmployment.be  This test is no t yet approved or cleared by the [place] FDA and  has been authorized for detection and/or diagnosis of SARS-CoV-2 by FDA under an Emergency Use Authorization (EUA). This EUA will remain  in effect (meaning this test can be used) for the duration of the COVID-19 declaration under Section 564(b)(1) of the Act, 21 U.S.C.section 360bbb-3(b)(1), unless the authorization is terminated  or revoked sooner.       Influenza A by PCR NEGATIVE NEGATIVE Final   Influenza B by PCR NEGATIVE NEGATIVE Final    Comment: (NOTE) The Xpert Xpress SARS-CoV-2/FLU/RSV plus assay is intended as an aid in the diagnosis of influenza from Nasopharyngeal swab specimens and should not be used as a sole basis for treatment. Nasal washings and aspirates are unacceptable for Xpert Xpress SARS-CoV-2/FLU/RSV testing.  Fact Sheet for  Patients: EntrepreneurPulse.com.au  Fact Sheet for Healthcare Providers: IncredibleEmployment.be  This test is not yet approved or cleared by the [place] FDA and has been authorized for detection and/or diagnosis of SARS-CoV-2 by FDA under an Emergency Use Authorization (EUA). This EUA will remain in effect (meaning this test can be used) for the duration of the COVID-19 declaration under Section 564(b)(1) of the Act, 21 U.S.C. section 360bbb-3(b)(1), unless the authorization is terminated or revoked.  Performed at [facility], [place] [address] [place], [place] [address]   Surgical pcr screen     Status: None   Collection Time: 03/13/21  9:54 PM   Specimen: Nasal Mucosa; Nasal Swab  Result Value Ref Range Status   MRSA, PCR NEGATIVE NEGATIVE Final   Staphylococcus aureus NEGATIVE NEGATIVE Final    Comment: (NOTE) The Xpert SA Assay (FDA approved for NASAL specimens in patients 42 years of age and older), is one component of a comprehensive surveillance program. It is not intended to diagnose infection nor to guide or monitor treatment. Performed at [facility], [place]  [address] [place], [place] [address]      Scheduled Meds: . acyclovir  400 mg Oral BID  . bumetanide  0.5 mg Oral Daily  . cholecalciferol  2,000 Units Oral Daily  . melatonin  10 mg Oral QHS  . rosuvastatin  10 mg Oral Daily  . zolpidem  5 mg Oral QHS   Continuous Infusions: . heparin 1,100 Units/hr (03/15/21 0757)     LOS: 2 days   Cherene Altes, MD [facility] Office  [phone] Pager - Text Page per Amion  If 7PM-7AM, please contact night-coverage per Amion 03/15/2021, 9:33 AM

## 2021-03-15 NOTE — Op Note (Signed)
Patient name: Wendy [name] MRN: [id number] DOB: 27-Feb-1946 Sex: female  03/15/2021 Pre-operative Diagnosis: Occluded IVC with bilateral lower extremity DVT Post-operative diagnosis:  Same Surgeon:  Eda Paschal. Donzetta Matters, MD Assistant: Ulyses Jarred, MD Procedure Performed: 1.  Ultrasound-guided cannulation bilateral small saphenous veins 2.  Bilateral lower extremity venography and central venogram 3.  Bilateral femoral, common femoral, external and common iliac vein intravascular ultrasound and intravascular ultrasound of IVC 4.  Mechanical thrombectomy of IVC, bilateral common iliac vein, bilateral external iliac vein, bilateral common femoral and femoral vein with Inari clottriever 5.  Removal of foreign body IVC  Indications: 75 year old female currently undergoing chemotherapy for amyloidosis found to have extensive bilateral lower extremity DVT with clotted IVC.  She is indicated for mechanical thrombectomy from bilateral popliteal approach.  Assistant was necessary due to the complex and bilateral nature of this case.  Findings: She appeared to have occluded left femoral, common femoral, external and common iliac veins and her IVC was also occluded.  On the right side she was occluded from the IVC her common and external iliac veins appeared occluded there did appear to be flow although rouleaux flow in the common femoral and femoral veins.  There was a foreign body in the IVC that did appear to be related to her previous vertebroplasty.  After extensive bilateral lower extremity mechanical thrombectomy we did have open channels throughout the bilateral lower extremities her bilateral external and common leg veins were patent and her IVC was patent.  We were able to snare the foreign body in the IVC and retrieved this all the way down to the femoral vein unfortunately it entered a branch and was placed as Endo trash likely into the muscle of the thigh.  We then ballooned around this to seal  the vein and it did not appear to be intraluminal at completion.  There was some impingement of the IVC at the location of the previous foreign body we were able to place the same 10 mm balloon there did not have any flow limitation or reflux into the renal veins or lumbar veins to suggest an issue.   Procedure:  The patient was identified in the holding area and taken to the operating room where she was placed initially under general anesthesia on the bed and then flipped prone on the table and prepped and draped in the popliteal fossa bilaterally.  Antibiotics were ministered timeout was called.  Ultrasound was used to identify the bilateral small saphenous veins which were cannulated with micropuncture needle followed by wire and sheath bilaterally.  We then placed a Bentson wire followed by 5 [place] sheath replaced wires into the IVC and then placed an Amplatz wire from the left Glidewire advantage from the right and placed 8 [place] sheaths.  An additional 10,000 units of heparin was administered.  Intravascular ultrasound was performed both sides.  We began with mechanical thrombectomy from the left side.  It did appear that the device was hanging up on previous semen from her vertebroplasty.  This initially had been pointed up in the cava we then after pulling down her mechanical thrombectomy device was pointed down.  We proceeded with mechanical thrombectomy multiple passes 6 from the left and 4 from the right until they appear to be clean passes we did have mostly acute thrombus with more chronic appearing thrombus on the left than the right.  Ultimately we then placed the basket of the mechanical thrombectomy device above the foreign body.  The  foreign body was then snared and disconnected.  We had also identified with intravascular ultrasound we able to see this being pulled under fluoroscopy down into the left leg.  Unfortunately as he got close to our sheath and went into a branch.  We attempted to  force it into the basket but could not.  Ultimately it appeared that it was in either a small branch or into the muscle.  We did have active extravasation.  We then placed the balloon at 3 atm was a 10 mm balloon for approximately 5 minutes.  Completion venography did demonstrate no residual extravasation it did not appear that the foreign body was in the vein.  We performed completion intravascular ultrasound bilaterally and then performed completion venography.  We did use the same balloon to dilate with the site of the previous foreign body this was easily bottle brushed as a 10 mm balloon at 12 atm.  With that we elected no further intervention.  Wires and catheters were removed.  We placed suture bolsters at the site of the popliteal access sites and remove the sheaths.  Patient was then awakened from anesthesia having tolerated procedure well without any complication.  EBL: 500cc  Plan for 2 units prbc's transfusion when blood available (patient has antibodies)    Brandon C. Donzetta Matters, MD [facility] Office: [phone] Pager: [phone]

## 2021-03-15 NOTE — Progress Notes (Addendum)
Cont Heparin drip pre-op per Dr Donnetta Hutching. 1350 Pt to Short stay, NPO maint.

## 2021-03-15 NOTE — Anesthesia Preprocedure Evaluation (Addendum)
Anesthesia Evaluation  Patient identified by MRN, date of birth, ID band Patient awake    Reviewed: Allergy & Precautions, NPO status , Patient's Chart, lab work & pertinent test results  Airway Mallampati: I  TM Distance: >3 FB Neck ROM: Full    Dental no notable dental hx.    Pulmonary former smoker,    Pulmonary exam normal breath sounds clear to auscultation       Cardiovascular hypertension, Pt. on medications + DVT  Normal cardiovascular exam Rhythm:Regular Rate:Normal  ECG: SR, rate 99   Neuro/Psych Anxiety  Neuromuscular disease    GI/Hepatic GERD  Medicated and Controlled,(+) Hepatitis -, A  Endo/Other  hyponatremia  Renal/GU negative Renal ROS     Musculoskeletal  (+) Arthritis ,   Abdominal   Peds  Hematology  (+) anemia , HLD   Anesthesia Other Findings DVT  Reproductive/Obstetrics                           Anesthesia Physical Anesthesia Plan  ASA: III  Anesthesia Plan: General   Post-op Pain Management:    Induction: Intravenous  PONV Risk Score and Plan: 3 and Ondansetron, Dexamethasone, Amisulpride and Treatment may vary due to age or medical condition  Airway Management Planned: Oral ETT  Additional Equipment:   Intra-op Plan:   Post-operative Plan: Extubation in OR  Informed Consent: I have reviewed the patients History and Physical, chart, labs and discussed the procedure including the risks, benefits and alternatives for the proposed anesthesia with the patient or authorized representative who has indicated his/her understanding and acceptance.     Dental advisory given  Plan Discussed with: CRNA  Anesthesia Plan Comments:         Anesthesia Quick Evaluation

## 2021-03-15 NOTE — Transfer of Care (Signed)
Immediate Anesthesia Transfer of Care Note  Patient: Wendy [name]. [place]  Procedure(s) Performed: PERCUTANEOUS VENOUS THROMBECTOMY AND LYSIS WITH INTRAVASCULAR ULTRASOUND (IVUS) (Bilateral )  Patient Location: PACU  Anesthesia Type:General  Level of Consciousness: awake, alert  and patient cooperative  Airway & Oxygen Therapy: Patient Spontanous Breathing  Post-op Assessment: Report given to RN and Post -op Vital signs reviewed and stable  Post vital signs: Reviewed and stable  Last Vitals:  Vitals Value Taken Time  BP 117/62 03/15/21 1826  Temp 36.3 C 03/15/21 1822  Pulse 106 03/15/21 1828  Resp 20 03/15/21 1828  SpO2 98 % 03/15/21 1828  Vitals shown include unvalidated device data.  Last Pain:  Vitals:   03/15/21 1822  TempSrc:   PainSc: 0-No pain         Complications: No complications documented.

## 2021-03-15 NOTE — Plan of Care (Signed)
  Problem: Education: Goal: Knowledge of General Education information will improve Description: Including pain rating scale, medication(s)/side effects and non-pharmacologic comfort measures Outcome: Progressing   Problem: Health Behavior/Discharge Planning: Goal: Ability to manage health-related needs will improve Outcome: Progressing   Problem: Elimination: Goal: Will not experience complications related to bowel motility Outcome: Progressing   Problem: Pain Managment: Goal: General experience of comfort will improve Outcome: Progressing   Problem: Skin Integrity: Goal: Risk for impaired skin integrity will decrease Outcome: Progressing   

## 2021-03-15 NOTE — Progress Notes (Signed)
[place] for Heparin Indication: acute DVT 03/13/21  Allergies  Allergen Reactions  . Bentyl [Dicyclomine Hcl]     Groggy, blurred vision  . Butalbital-Aspirin-Caffeine Other (See Comments)    hallucinations  . Clonidine Derivatives     dizziness, lightheadedness, abdominal cramping, dry mouth/throat  . Linzess [Linaclotide] Diarrhea  . Morphine And Related Other (See Comments)    Does not work  . Motrin [Ibuprofen]     GI upset  . Penicillins Other (See Comments)    As child; reaction unknown Has patient had a PCN reaction causing immediate rash, facial/tongue/throat swelling, SOB or lightheadedness with hypotension: No Has patient had a PCN reaction causing severe rash involving mucus membranes or skin necrosis: No Has patient had a PCN reaction that required hospitalization: No Has patient had a PCN reaction occurring within the last 10 years: No If all of the above answers are "NO", then may proceed with Cephalosporin use.  . Sulfa Antibiotics     In childhood  . Zanaflex [Tizanidine Hcl] Other (See Comments)    Decreased BP  . Diphenhydramine Hcl Palpitations    restlessness    Patient Measurements: Height: 5\' 2"  (157.5 cm) Weight: 60.8 kg (134 lb) IBW/kg (Calculated) : 50.1 Heparin Dosing Weight: 61kg  Vital Signs: Temp: 98.2 F (36.8 C) (03/23 0409) Temp Source: Oral (03/23 0409) BP: 108/59 (03/23 0409) Pulse Rate: 98 (03/23 0409)  Labs: Recent Labs    03/13/21 1317 03/13/21 1432 03/13/21 1432 03/13/21 2211 03/14/21 0021 03/14/21 0818 03/15/21 0341  HGB 9.8*  --   --   --  8.5*  --  8.0*  HCT 27.7*  --   --   --  23.6*  --  22.6*  PLT 257  --   --   --  282  --  316  APTT  --  34  --   --   --   --   --   LABPROT  --  12.5  --   --   --   --   --   INR  --  1.0  --   --   --   --   --   HEPARINUNFRC  --  <0.10*   < >  --  0.35 0.33 0.32  CREATININE 1.22*  --   --  1.15*  --   --  1.14*   < > = values in this  interval not displayed.    Estimated Creatinine Clearance: 36.1 mL/min (A) (by C-G formula based on SCr of 1.14 mg/dL (H)).   Assessment: 75 y/o F with acute DVT in LLE femoral vein, RLE external iliac vein and IVC 3/21. No anticoagulation prior to admission. Pharmacy consulted for heparin.    HL 0.32 therapeutic on low end. Mechanical thrombectomy planned for LLE 3/23. Will increase rate slightly to target higher end of goal given extensive thrombus.   Goal of Therapy:  Heparin level 0.3-0.7 units/ml Monitor platelets by anticoagulation protocol: Yes   Plan:  Increase heparin infusion to 1100 units/hr Monitor daily HL, CBC/plt Monitor for signs/symptoms of bleeding  F/u PO anticoagulation plan  Benetta Spar, PharmD, BCPS, BCCP Clinical Pharmacist  Please check AMION for all [place] phone numbers After 10:00 PM, call Bureau

## 2021-03-15 NOTE — Anesthesia Procedure Notes (Signed)
Procedure Name: Intubation Date/Time: 03/15/2021 3:33 PM Performed by: Georgia Duff, CRNA Pre-anesthesia Checklist: Patient identified, Emergency Drugs available, Suction available and Patient being monitored Patient Re-evaluated:Patient Re-evaluated prior to induction Oxygen Delivery Method: Circle System Utilized Preoxygenation: Pre-oxygenation with 100% oxygen Induction Type: IV induction Ventilation: Mask ventilation without difficulty Laryngoscope Size: Miller and 2 Grade View: Grade I Tube type: Oral Tube size: 7.0 mm Number of attempts: 1 Airway Equipment and Method: Stylet and Oral airway Placement Confirmation: ETT inserted through vocal cords under direct vision,  positive ETCO2 and breath sounds checked- equal and bilateral Secured at: 21 cm Tube secured with: Tape Dental Injury: Teeth and Oropharynx as per pre-operative assessment

## 2021-03-15 NOTE — TOC Initial Note (Addendum)
Transition of Care (TOC) - Initial/Assessment Note    Patient Details  Name: Wendy [name]. [place] MRN: [id number] Date of Birth: 1946/03/18  [facility]) CM/SW Contact:    Sherrilyn Rist Transition of Care Supervisor Phone Number: [phone] 03/15/2021, 10:51 AM  Clinical Narrative:                 Patient lives at home; PCP: [place], Wynelle Fanny, MD; has private insurance with [employer]; [facility] will continue to follow for progression of care and discharge needs.  Expected Discharge Plan: [place] vs HHC;  Barriers to Discharge: No Barriers Identified   Patient Goals and CMS Choice        Expected Discharge Plan and Services Expected Discharge Plan: [place]       Living arrangements for the past 2 months: Single Family Home                                      Prior Living Arrangements/Services Living arrangements for the past 2 months: Single Family Home                     Activities of Daily Living Home Assistive Devices/Equipment: None ADL Screening (condition at time of admission) Patient's cognitive ability adequate to safely complete daily activities?: Yes Is the patient deaf or have difficulty hearing?: No Does the patient have difficulty seeing, even when wearing glasses/contacts?: No Does the patient have difficulty concentrating, remembering, or making decisions?: No Patient able to express need for assistance with ADLs?: Yes Does the patient have difficulty dressing or bathing?: No Independently performs ADLs?: Yes (appropriate for developmental age) Does the patient have difficulty walking or climbing stairs?: No Weakness of Legs: Both Weakness of Arms/Hands: None  Permission Sought/Granted                  Emotional Assessment              Admission diagnosis:  Hyponatremia [E87.1] DVT (deep venous thrombosis) (HCC) [I82.409] Acute deep vein thrombosis  (DVT) of proximal vein of left lower extremity ([place]) [I82.4Y2] Patient Active Problem List   Diagnosis Date Noted  . Compression fracture of T10 vertebra ([place]) 03/14/2021  . Fall at home, initial encounter 03/14/2021  . DVT (deep venous thrombosis) ([place]) 03/13/2021  . Multiple myeloma ([place]) 02/10/2021  . Leucocytosis 02/10/2021  . 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 Situational anxiety 01/11/2021  . 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  . Hyperlipidemia 10/19/2020  . Hearing loss 06/14/2020  . Pedal edema 06/01/2020  . Aortic atherosclerosis ([place]) 04/06/2020  . CAD (coronary artery disease) 04/06/2020  . H/O compression fracture of spine 04/06/2020  . Chronic back pain 04/06/2020  . Pulmonary nodules 03/17/2020  . Bloating 06/02/2018  . Heartburn 06/02/2018  . Chronic constipation 12/31/2017  . Blood glucose elevated 09/25/2017  . History of ileus 06/07/2017  . Right carpal tunnel syndrome 12/07/2016  . Estrogen deficiency 09/24/2016  . Routine general medical examination at a health care facility 09/04/2015  . Colon cancer screening 12/11/2014  . Encounter for Medicare annual wellness exam 05/17/2013  . Osteoarthritis 03/28/2011  . Degenerative disc disease, lumbar 03/28/2011  . Hyponatremia 02/12/2011  . Essential hypertension 08/01/2010  . Osteoporosis 08/01/2010   PCP:  Abner Greenspan, MD Pharmacy:   [place], [place]  [address], [address] [place] [address] Phone: [phone] Fax: [phone]  [facility] [place], [place] [address] Phone: [phone] Fax: [phone]     Social Determinants of Health (SDOH) Interventions    Readmission Risk Interventions No flowsheet data found.

## 2021-03-16 ENCOUNTER — Inpatient Hospital Stay (HOSPITAL_COMMUNITY): Payer: Medicare Other

## 2021-03-16 ENCOUNTER — Other Ambulatory Visit: Payer: Medicare Other

## 2021-03-16 ENCOUNTER — Ambulatory Visit: Payer: Medicare Other

## 2021-03-16 ENCOUNTER — Encounter (HOSPITAL_COMMUNITY): Payer: Self-pay | Admitting: Vascular Surgery

## 2021-03-16 DIAGNOSIS — I824Y2 Acute embolism and thrombosis of unspecified deep veins of left proximal lower extremity: Secondary | ICD-10-CM | POA: Diagnosis not present

## 2021-03-16 DIAGNOSIS — N179 Acute kidney failure, unspecified: Secondary | ICD-10-CM | POA: Diagnosis not present

## 2021-03-16 DIAGNOSIS — E871 Hypo-osmolality and hyponatremia: Secondary | ICD-10-CM | POA: Diagnosis not present

## 2021-03-16 LAB — CBC
HCT: 31.2 % — ABNORMAL LOW (ref 36.0–46.0)
Hemoglobin: 10.8 g/dL — ABNORMAL LOW (ref 12.0–15.0)
MCH: 32.1 pg (ref 26.0–34.0)
MCHC: 34.6 g/dL (ref 30.0–36.0)
MCV: 92.9 fL (ref 80.0–100.0)
Platelets: 268 10*3/uL (ref 150–400)
RBC: 3.36 MIL/uL — ABNORMAL LOW (ref 3.87–5.11)
RDW: 14.9 % (ref 11.5–15.5)
WBC: 5.3 10*3/uL (ref 4.0–10.5)
nRBC: 0 % (ref 0.0–0.2)

## 2021-03-16 LAB — BASIC METABOLIC PANEL
Anion gap: 9 (ref 5–15)
BUN: 15 mg/dL (ref 8–23)
CO2: 25 mmol/L (ref 22–32)
Calcium: 8.2 mg/dL — ABNORMAL LOW (ref 8.9–10.3)
Chloride: 95 mmol/L — ABNORMAL LOW (ref 98–111)
Creatinine, Ser: 1.14 mg/dL — ABNORMAL HIGH (ref 0.44–1.00)
GFR, Estimated: 50 mL/min — ABNORMAL LOW (ref >60)
Glucose, Bld: 137 mg/dL — ABNORMAL HIGH (ref 70–99)
Potassium: 4 mmol/L (ref 3.5–5.1)
Sodium: 129 mmol/L — ABNORMAL LOW (ref 135–145)

## 2021-03-16 LAB — HEPARIN LEVEL (UNFRACTIONATED): Heparin Unfractionated: 0.38 IU/mL (ref 0.30–0.70)

## 2021-03-16 IMAGING — DX DG FEMUR 2+V PORT*L*
4 series · 4 of 4 positions shown · non-contrast
Comparison: None

CLINICAL DATA: Evaluate for possible foreign body

EXAM:
LEFT FEMUR PORTABLE 2 VIEWS

[femur ap (1 of 2)]
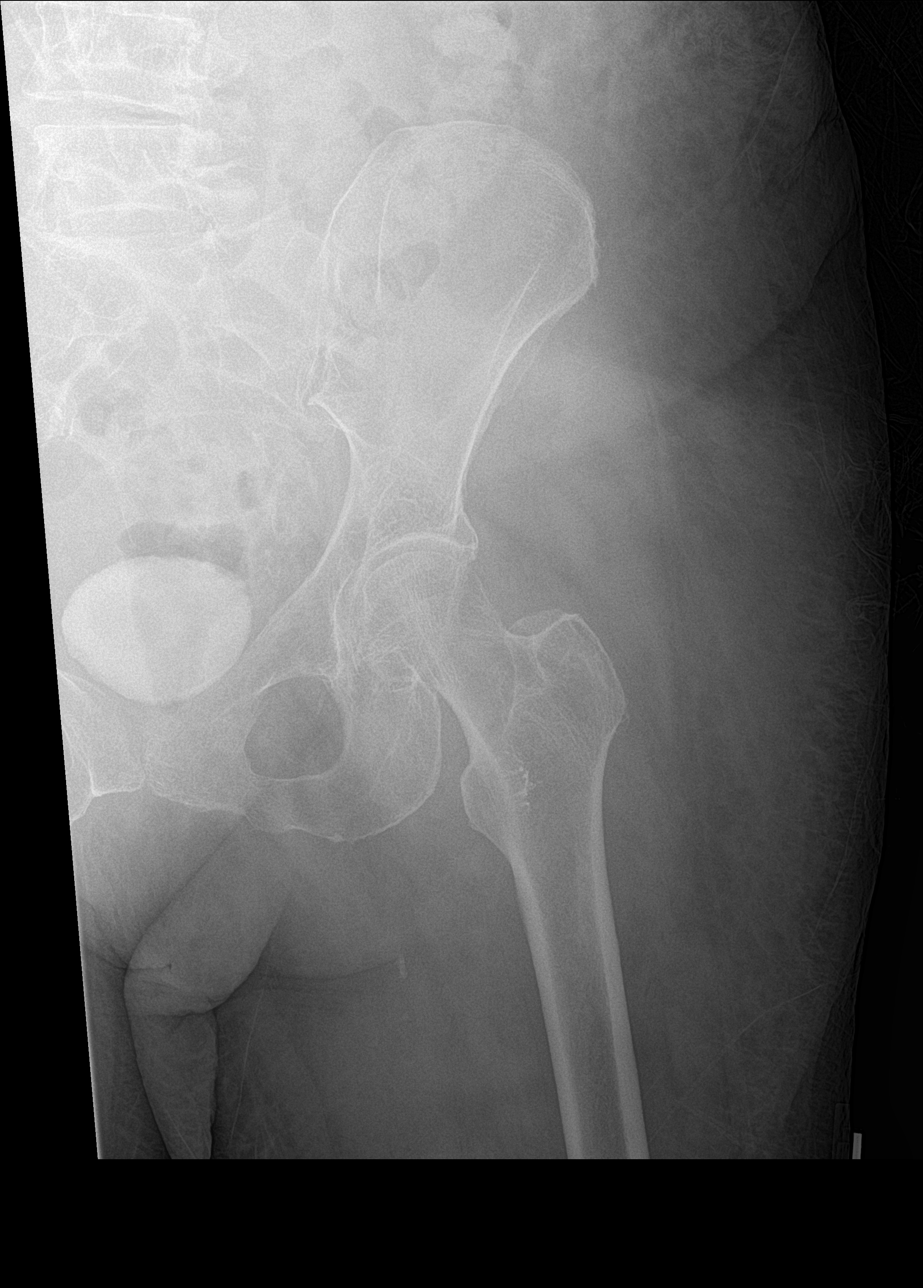

[femur ap (2 of 2)]
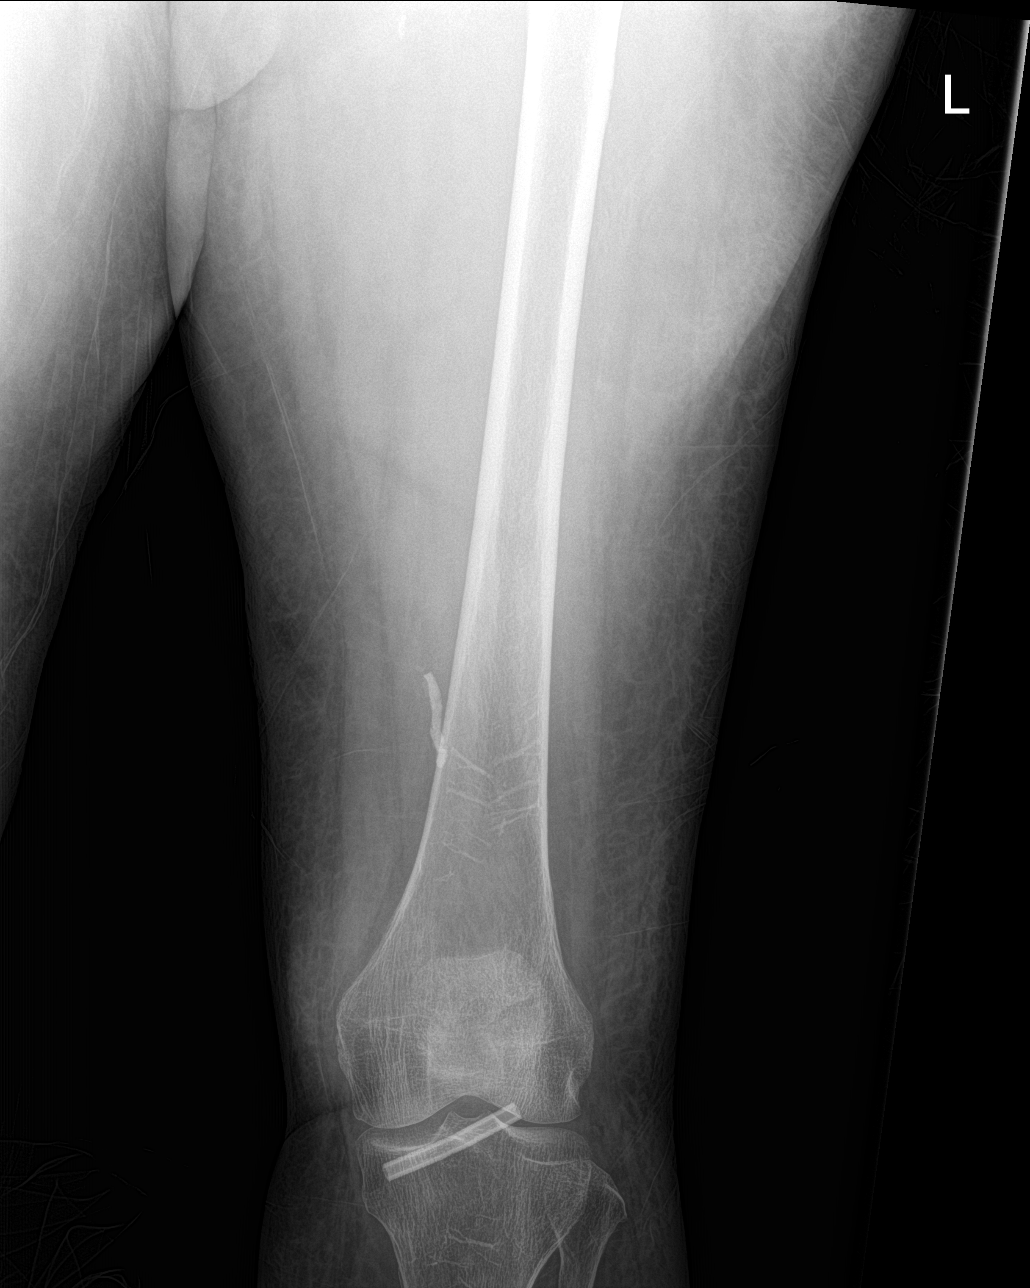

[femur lat (1 of 2)]
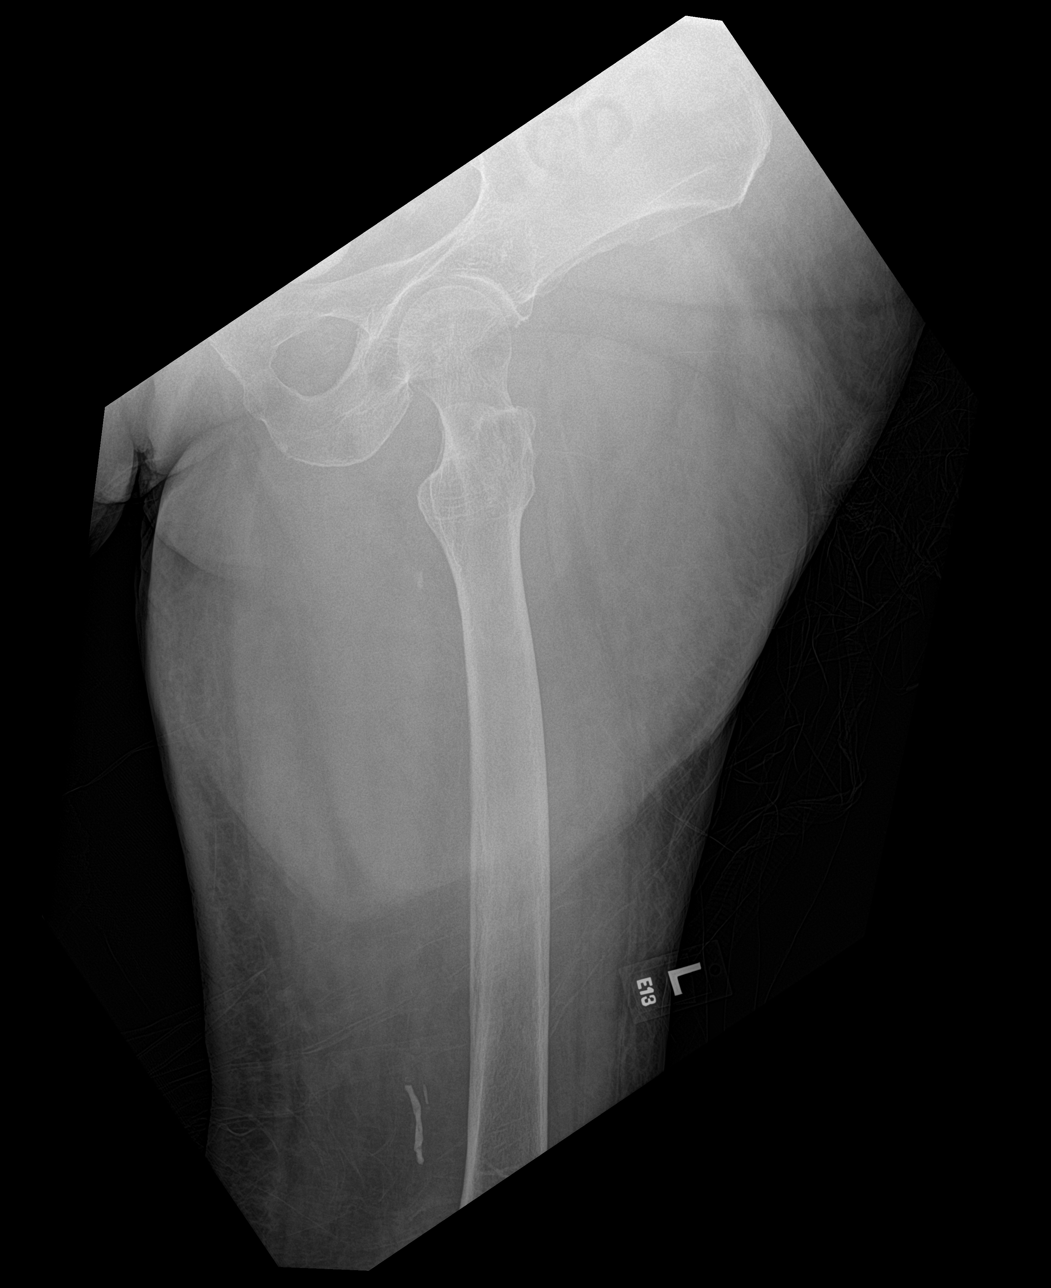

[femur lat (2 of 2)]
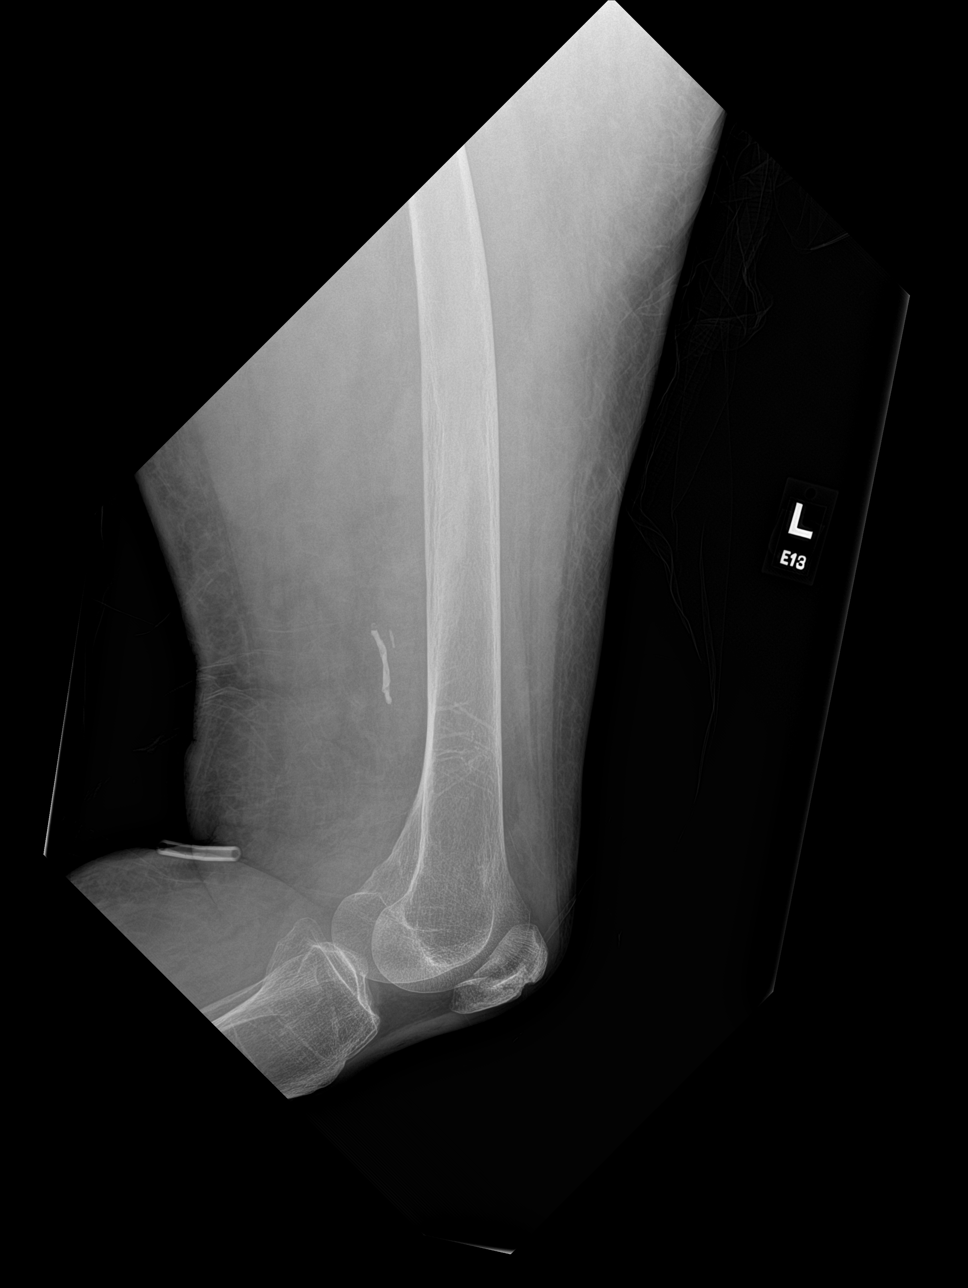

[4 of 4 positions shown; findings below may reference images not displayed]

FINDINGS: Contrast is noted within the bladder from prior CT. No acute
fracture or dislocation is noted. Prior patellar fracture is seen
with some callus formation identified. Radiopaque material is noted
in the medial soft tissues of the thigh distally consistent with the
recently retrieved methylmethacrylate fragment from the IVC. This is
similar to that seen on prior lumbar spine film in the IVC and
corresponds with the given clinical history.
IMPRESSION: Methylmethacrylate within the soft tissues of the distal thigh
consistent with the recent manipulation of an intravascular foreign
body in the IVC.

Healing left patellar fracture.

## 2021-03-16 MED ORDER — ACETAMINOPHEN 325 MG PO TABS
650.0000 mg | ORAL_TABLET | Freq: Four times a day (QID) | ORAL | Status: DC | PRN
  Administered 2021-03-16: 650 mg via ORAL
  Filled 2021-03-16: qty 2

## 2021-03-16 MED ORDER — GUAIFENESIN 100 MG/5ML PO SOLN
5.0000 mL | ORAL | Status: DC | PRN
  Administered 2021-03-16: 100 mg via ORAL
  Filled 2021-03-16: qty 5

## 2021-03-16 MED ORDER — APIXABAN 5 MG PO TABS
10.0000 mg | ORAL_TABLET | Freq: Two times a day (BID) | ORAL | Status: DC
  Administered 2021-03-16 – 2021-03-20 (×9): 10 mg via ORAL
  Filled 2021-03-16 (×9): qty 2

## 2021-03-16 MED ORDER — PHENOL 1.4 % MT LIQD
1.0000 | OROMUCOSAL | Status: DC | PRN
  Administered 2021-03-16: 1 via OROMUCOSAL
  Filled 2021-03-16: qty 177

## 2021-03-16 MED ORDER — CALCIUM CARBONATE ANTACID 500 MG PO CHEW
1.0000 | CHEWABLE_TABLET | Freq: Four times a day (QID) | ORAL | Status: DC | PRN
  Administered 2021-03-16 – 2021-03-17 (×2): 400 mg via ORAL
  Filled 2021-03-16 (×2): qty 2

## 2021-03-16 MED ORDER — APIXABAN 5 MG PO TABS: 5.0000 mg | ORAL_TABLET | Freq: Two times a day (BID) | ORAL | Status: DC

## 2021-03-16 NOTE — Anesthesia Postprocedure Evaluation (Signed)
Anesthesia Post Note  Patient: Wendy [name]. [facility]  Procedure(s) Performed: PERCUTANEOUS VENOUS THROMBECTOMY AND LYSIS WITH INTRAVASCULAR ULTRASOUND (IVUS) (Bilateral )     Patient location during evaluation: PACU Anesthesia Type: General Level of consciousness: awake and alert Pain management: pain level controlled Vital Signs Assessment: post-procedure vital signs reviewed and stable Respiratory status: spontaneous breathing, nonlabored ventilation, respiratory function stable and patient connected to nasal cannula oxygen Cardiovascular status: blood pressure returned to baseline and stable Postop Assessment: no apparent nausea or vomiting Anesthetic complications: no   No complications documented.  Last Vitals:  Vitals:   03/16/21 0845 03/16/21 1029  BP: 112/65 105/62  Pulse: (!) 110 (!) 108  Resp: 20   Temp: 36.7 C 36.8 C  SpO2:  98%    Last Pain:  Vitals:   03/16/21 1029  TempSrc: Oral  PainSc:                  Tiajuana Amass

## 2021-03-16 NOTE — TOC Benefit Eligibility Note (Signed)
Transition of Care [facility]) Benefit Eligibility Note    Patient Details  Name: Wendy [name] MRN: [id number] Date of Birth: 1946/04/09   Medication/Dose: ELIQUIS 5 MG BID  Covered?: Yes  Tier: 2 Drug  Prescription Coverage Preferred Pharmacy: [facility]  Spoke with Person/Company/Phone Number:: [employer] @ [place] [facility] #  [phone]  Co-Pay: 25 % OF TOTAL COST  Prior Approval: No  Deductible: Met  Additional Notes: APIXABAN   and  ELIQUIS 10 MG BID : Crecencio Mc Phone Number: 03/16/2021, 4:34 PM

## 2021-03-16 NOTE — Progress Notes (Addendum)
[place] for Heparin >> apixaban  Indication: acute DVT 03/13/21  Allergies  Allergen Reactions  . Bentyl [Dicyclomine Hcl]     Groggy, blurred vision  . Butalbital-Aspirin-Caffeine Other (See Comments)    hallucinations  . Clonidine Derivatives     dizziness, lightheadedness, abdominal cramping, dry mouth/throat  . Linzess [Linaclotide] Diarrhea  . Morphine And Related Other (See Comments)    Does not work  . Motrin [Ibuprofen]     GI upset  . Penicillins Other (See Comments)    As child; reaction unknown Has patient had a PCN reaction causing immediate rash, facial/tongue/throat swelling, SOB or lightheadedness with hypotension: No Has patient had a PCN reaction causing severe rash involving mucus membranes or skin necrosis: No Has patient had a PCN reaction that required hospitalization: No Has patient had a PCN reaction occurring within the last 10 years: No If all of the above answers are "NO", then may proceed with Cephalosporin use.  . Sulfa Antibiotics     In childhood  . Zanaflex [Tizanidine Hcl] Other (See Comments)    Decreased BP  . Diphenhydramine Hcl Palpitations    restlessness    Patient Measurements: Height: 5\' 2"  (157.5 cm) Weight: 60.8 kg (134 lb) IBW/kg (Calculated) : 50.1 Heparin Dosing Weight: 61kg  Vital Signs: Temp: 98.7 F (37.1 C) (03/24 0540) Temp Source: Oral (03/24 0540) BP: 120/72 (03/24 0540) Pulse Rate: 105 (03/24 0540)  Labs: Recent Labs    03/13/21 1317 03/13/21 1432 03/13/21 1432 03/13/21 2211 03/14/21 0021 03/14/21 0818 03/15/21 0341  HGB 9.8*  --   --   --  8.5*  --  8.0*  HCT 27.7*  --   --   --  23.6*  --  22.6*  PLT 257  --   --   --  282  --  316  APTT  --  34  --   --   --   --   --   LABPROT  --  12.5  --   --   --   --   --   INR  --  1.0  --   --   --   --   --   HEPARINUNFRC  --  <0.10*   < >  --  0.35 0.33 0.32  CREATININE 1.22*  --   --  1.15*  --   --  1.14*   < > =  values in this interval not displayed.    Estimated Creatinine Clearance: 36.1 mL/min (A) (by C-G formula based on SCr of 1.14 mg/dL (H)).   Assessment: 75 y/o F with acute DVT in LLE femoral vein, RLE external iliac vein and IVC 3/21. No anticoagulation prior to admission. Now s/p mechanical thrombectomy 3/23 where a foreign body from previous vertebroplasty was found. Retrieval was attempted but unable to completely remove. Pharmacy consulted for heparin transition to apixaban.  HL 0.38 therapeutic. ClCr ~70ml/min. Received 2 full days of therapeutic anticoagulation. H/H responded appropriately to 2u RBC transfusion, plt stable.  Goal of Therapy:  Heparin level 0.3-0.7 units/ml Monitor platelets by anticoagulation protocol: Yes   Plan:  Stop heparin Start apixaban 10mg  BID for 5 more days then decrease to 5mg  BID Monitor for signs/symptoms of bleeding    Benetta Spar, PharmD, BCPS, BCCP Clinical Pharmacist  Please check AMION for all [place] phone numbers After 10:00 PM, call [place]

## 2021-03-16 NOTE — TOC Progression Note (Signed)
Transition of Care (TOC) - Progression Note    Patient Details  Name: Wendy [name]. [place] MRN: [id number] Date of Birth: [birth year]  [facility] CM/SW Contact  Milinda Antis, [place] Phone Number: 03/16/2021, 4:31 PM  Clinical Narrative:    CSW received consult for [facility] v/s SNF placement at time of discharge. CSW spoke with patient. Patient reported that she does not have anyone at home who can provide 24 hour supervision Patient expressed understanding of PT recommendation and is agreeable to SNF placement at time of discharge. Patient reports preference for [facility] or anywhere in [place].  The patient was open to [facility] sending the referral out to facilities in [place], but does not want to go to [place]. CSW discussed insurance authorization process and provided Medicare SNF ratings list. Patient has received the COVID vaccines. Patient expressed being hopeful for rehab and to feel better soon. No further questions reported at this time.    Expected Discharge Plan: [place] Barriers to Discharge: SNF Pending bed offer,Continued Medical Work up  Expected Discharge Plan and Services Expected Discharge Plan: [place] arrangements for the past 2 months: Single Family Home                                       Social Determinants of Health (SDOH) Interventions    Readmission Risk Interventions No flowsheet data found.

## 2021-03-16 NOTE — Progress Notes (Signed)
Physical Therapy Treatment Patient Details Name: Wendy [name]. [place] MRN: [id number] DOB: January 19, 1946 Today's Date: 03/16/2021    History of Present Illness 75 y.o. female admitted 3/21 for weakness, leg swelling and mechanical fall. 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  Has pain on post knees from the vein bolsters, which are to be removed today per pt.  Follow along with her for progression of gait and balance skills, and will hopefully be up walking more next visit.  Follow for goals of acute PT.   Follow Up Recommendations  Home health PT;Supervision/Assistance - 24 hour     Equipment Recommendations  Rolling walker with 5" wheels    Recommendations for Other Services       Precautions / Restrictions Precautions Precautions: Fall Precaution Comments: bilat LE edema up t/o hips/pelvis Restrictions Weight Bearing Restrictions: No    Mobility  Bed Mobility               General bed mobility comments: up in chair    Transfers Overall transfer level: Needs assistance Equipment used: Rolling walker (2 wheeled) Transfers: Sit to/from Stand Sit to Stand: Min assist         General transfer comment: minor power up help post thrombectomy  Ambulation/Gait         Gait velocity: reduced   General Gait Details: steps were associated wtih transfer to bed   Stairs             Wheelchair Mobility    Modified Rankin (Stroke Patients Only)       Balance Overall balance assessment: Needs assistance Sitting-balance support: Feet supported Sitting balance-Leahy Scale: Good        Standing balance-Leahy Scale: Fair Standing balance comment: less than fair dynamically                            Cognition Arousal/Alertness: Awake/alert Behavior During Therapy: WFL for tasks assessed/performed Overall Cognitive Status: Within Functional Limits for tasks assessed                                        Exercises General Exercises - Lower Extremity Ankle Circles/Pumps: AROM;5 reps Quad Sets: AROM;10 reps Heel Slides: AROM;10 reps    General Comments General comments (skin integrity, edema, etc.): Pt was assisted to bed and instructed in gentle ex to manage her recent surgery      Pertinent Vitals/Pain Pain Assessment: Faces Faces Pain Scale: Hurts little more Pain Location: LLE Pain Descriptors / Indicators: Aching;Burning Pain Intervention(s): Monitored during session;Premedicated before session;Repositioned    Home Living                      Prior Function            PT Goals (current goals can now be found in the care plan section) Acute Rehab PT Goals Patient Stated Goal: get the pain managed Progress  towards PT goals: Progressing toward goals    Frequency    Min 3X/week      PT Plan Current plan remains appropriate    Co-evaluation              AM-PAC PT "6 Clicks" Mobility   Outcome Measure  Help needed turning from your back to your side while in a flat bed without using bedrails?: None Help needed moving from lying on your back to sitting on the side of a flat bed without using bedrails?: None Help needed moving to and from a bed to a chair (including a wheelchair)?: A Little Help needed standing up from a chair using your arms (e.g., wheelchair or bedside chair)?: A Little Help needed to walk in hospital room?: A Little Help needed climbing 3-5 steps with a railing? : A Little 6 Click Score: 20    End of Session Equipment Utilized During Treatment: Gait belt Activity Tolerance:  Patient tolerated treatment well Patient left: in bed;with call bell/phone within reach;with bed alarm set Nurse Communication: Mobility status PT Visit Diagnosis: Unsteadiness on feet (R26.81);Muscle weakness (generalized) (M62.81)     Time: 0981-1914 PT Time Calculation (min) (ACUTE ONLY): 33 min  Charges:  $Therapeutic Exercise: 8-22 mins $Therapeutic Activity: 8-22 mins         Ramond Dial 03/16/2021, 10:10 PM Mee Hives, PT MS Acute Rehab Dept. Number: [place]

## 2021-03-16 NOTE — Progress Notes (Addendum)
   VASCULAR SURGERY ASSESSMENT & PLAN:   1 Day Post-Op: 74 to with occluded IVC with BLE DVT. She underwent mechanical thrombectomy of IVC, bilateral common iliac vein, bilateral external iliac vein, bilateral common femoral and femoral vein with Inari clottriever and removal of foreign body IVC yesterday by Dr. Donzetta Matters. Edema improved. Remove popliteal vein bolsters.  Heparin infusion continues.  Acute blood loss s/p  2  Units of PRBCs. (pre-transfusion Hgb 8.0)  SUBJECTIVE:   States LE edema is improved. No significant pain.  PHYSICAL EXAM:   Vitals:   03/16/21 0158 03/16/21 0159 03/16/21 0236 03/16/21 0540  BP: (!) 101/57 (!) 101/57 103/60 120/72  Pulse: (!) 110 (!) 110 (!) 107 (!) 105  Resp: 16 16 18 17   Temp: 97.7 F (36.5 C) 97.7 F (36.5 C) 98 F (36.7 C) 98.7 F (37.1 C)  TempSrc: Oral Oral Oral Oral  SpO2: 95% 95% 97% 96%  Weight:      Height:       General appearance: Awake, alert in no apparent distress Cardiac: Heart rate and rhythm are regular Respirations: Nonlabored Incisions: Bilateral popliteal spaces without bleeding or hematoma Extremities: Both feet are warm with intact sensation and motor function.  [place] extremity edema left > right. 2+ right DP, 1+ left DP pulses  LABS:   Lab Results  Component Value Date   WBC 4.5 03/15/2021   HGB 8.0 (L) 03/15/2021   HCT 22.6 (L) 03/15/2021   MCV 96.2 03/15/2021   PLT 316 03/15/2021   Lab Results  Component Value Date   CREATININE 1.14 (H) 03/15/2021   Lab Results  Component Value Date   INR 1.0 03/13/2021   CBG (last 3)  Recent Labs    03/13/21 1430  GLUCAP 104*    PROBLEM LIST:    Active Problems:   Hyponatremia   Hyperlipidemia   Light chain (AL) amyloidosis (HCC)   DVT (deep venous thrombosis) (HCC)   Compression fracture of T10 vertebra ([place])   Fall at home, initial encounter   CURRENT MEDS:   . sodium chloride   Intravenous Once  . acyclovir  400 mg Oral BID  . bumetanide  0.5 mg  Oral Daily  . chlorhexidine  15 mL Mouth/Throat NOW  . cholecalciferol  2,000 Units Oral Daily  . melatonin  10 mg Oral QHS  . rosuvastatin  10 mg Oral Daily  . zolpidem  5 mg Oral QHS    Barbie Banner, [place] Office: [phone] 03/16/2021   I have independently interviewed and examined patient and agree with PA assessment and plan above.  Left lower extremity appears much improved from edema standpoint.  Will order thigh-high compression stockings bilaterally.  Will need anticoagulation with either Eliquis or Xarelto on discharge we will plan for at least through the completion of her chemotherapy and probably a few months after that as well given the extent of her DVT.  Out of bed as tolerated today.  We will remove bolsters tomorrow from behind her knees and she should be good for discharge at that time from a vascular standpoint.  I will also get a baseline x-ray of her left thigh where the foreign body was placed as Endo trash.  Nadyne Gariepy C. Donzetta Matters, MD [facility] Office: [phone] Pager: [phone]

## 2021-03-16 NOTE — Plan of Care (Signed)

## 2021-03-16 NOTE — Progress Notes (Signed)
Wendy [name]  YFV:[id number] DOB: 02-07-1946 DOA: 03/13/2021 PCP: Abner Greenspan, MD    Brief Narrative:  14BW with a hx of amyloidosis, HTN, HLD, and hyponatremia who was admitted after she was found to have bilateral lower extremity DVTs extending all the way into the inferior vena cava.   Antimicrobials:  Vancomycin 3/23  DVT prophylaxis: IV heparin  Consultants:  none  Subjective: Afebrile.  Mildly tachycardic at 105-110.  Saturations stable.  Resting comfortably in bed..  Does feel generally weak.  No shortness of breath or chest pain.  Has not yet ambulated to a significant extent.  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 Louanne Skye and has had a vertebroplasty - migrated cement found partially occluding IVC at time of vascular procedure   HLD Continue Crestor  Amyloidosis Followed by hematology and nephrology  Acute kidney injury Baseline creatinine 0.7 -creatinine 1.2 at admission - slowly improving w/ volume expansion   Anemia due to chronic disease Baseline hemoglobin 9.5-10 -slowly trending downward -continue to monitor -no gross blood loss evident at present - s/p transfusion of 2U PRBC this admit    Code Status: FULL CODE Family Communication:  Status is: Inpatient  Remains inpatient appropriate because:Inpatient level of care appropriate due to severity of illness   Dispo: The patient is from: Home              Anticipated d/c is to: unclear              Patient currently is not medically stable to  d/c.   Difficult to place patient No    Objective: Blood pressure 112/65, pulse (!) 110, temperature 98 F (36.7 C), temperature source Oral, resp. rate 20, height 5\' 2"  (1.575 m), weight 60.8 kg, SpO2 98 %.  Intake/Output Summary (Last 24 hours) at 03/16/2021 0939 Last data filed at 03/16/2021 0540 Gross per 24 hour  Intake 1736 ml  Output 725 ml  Net 1011 ml   Filed Weights   03/13/21 1236 03/15/21 1428  Weight: 60.8 kg 60.8 kg    Examination: General: No acute respiratory distress Lungs: Clear to auscultation without wheezing Cardiovascular: Regular rate and rhythm without murmur Abdomen: Nondistended, soft, BS positive, no rebound Extremities: No significant edema bilateral lower extremities  CBC: Recent Labs  Lab 03/13/21 1317 03/14/21 0021 03/15/21 0341  WBC 9.4 6.9 4.5  NEUTROABS 8.6*  --   --   HGB 9.8* 8.5* 8.0*  HCT 27.7* 23.6* 22.6*  MCV 96.5 94.8 96.2  PLT 257 282 466   Basic Metabolic Panel: Recent Labs  Lab 03/13/21 1317 03/13/21 2211 03/15/21 0341  NA 122* 123* 124*  K 4.6 3.9 3.8  CL 88* 93* 92*  CO2 22 21* 25  GLUCOSE 114* 131* 104*  BUN 27* 21 17  CREATININE 1.22* 1.15* 1.14*  CALCIUM 8.3* 8.0* 8.1*   GFR: Estimated Creatinine Clearance: 36.1 mL/min (A) (by C-G formula based on SCr of 1.14 mg/dL (H)).  Liver Function Tests: Recent Labs  Lab 03/13/21 1317  AST 25  ALT 19  ALKPHOS 86  BILITOT 1.2  PROT 5.6*  ALBUMIN 2.8*    Coagulation Profile: Recent Labs  Lab 03/13/21 1432  INR 1.0     HbA1C: Hgb A1c MFr Bld  Date/Time Value Ref Range Status  10/13/2020 08:00 AM 5.6 4.6 - 6.5 % Final    Comment:    Glycemic Control Guidelines for People with Diabetes:Non Diabetic:  <6%Goal of Therapy: <7%Additional Action Suggested:  >8%   10/12/2019 08:12 AM 5.5 4.6 - 6.5 % Final    Comment:    Glycemic Control Guidelines for People with Diabetes:Non Diabetic:  <6%Goal of Therapy: <7%Additional Action Suggested:  >8%      CBG: Recent Labs  Lab 03/13/21 1430  GLUCAP 104*    Recent Results (from the past 240 hour(s))  Resp Panel by RT-PCR (Flu A&B, Covid) Nasopharyngeal Swab     Status: None   Collection Time: 03/13/21  4:35 PM   Specimen: Nasopharyngeal Swab; Nasopharyngeal(NP) swabs in vial transport medium  Result Value Ref Range Status   SARS Coronavirus 2 by RT PCR NEGATIVE NEGATIVE Final    Comment: (NOTE) SARS-CoV-2 target nucleic acids are NOT DETECTED.  The SARS-CoV-2 RNA is generally detectable in upper respiratory specimens during the acute phase of infection. The lowest concentration of SARS-CoV-2 viral copies this assay can detect is 138 copies/mL. A negative result does not preclude SARS-Cov-2 infection and should not be used as the sole basis for treatment or other patient management decisions. A negative result may occur with  improper specimen collection/handling, submission of specimen other than nasopharyngeal swab, presence of viral mutation(s) within the areas targeted by this assay, and inadequate number of viral copies(<138 copies/mL). A negative result must be combined with clinical observations, patient history, and epidemiological information. The expected result is Negative.  Fact Sheet for Patients:  EntrepreneurPulse.com.au  Fact Sheet for Healthcare Providers:  IncredibleEmployment.be  This test is no t yet approved or cleared by the [place] FDA and  has been authorized for detection and/or diagnosis of SARS-CoV-2 by FDA under an Emergency Use Authorization (EUA). This EUA will remain  in effect (meaning this test can be used) for the duration of the COVID-19 declaration under Section 564(b)(1) of the Act, 21 U.S.C.section 360bbb-3(b)(1), unless the authorization is terminated  or revoked sooner.       Influenza A by PCR NEGATIVE NEGATIVE Final   Influenza B by PCR NEGATIVE NEGATIVE Final    Comment: (NOTE) The  Xpert Xpress SARS-CoV-2/FLU/RSV plus assay is intended as an aid in the diagnosis of influenza from Nasopharyngeal swab specimens and should not be used as a sole basis for treatment. Nasal washings and aspirates are unacceptable for Xpert Xpress SARS-CoV-2/FLU/RSV testing.  Fact Sheet for Patients: EntrepreneurPulse.com.au  Fact Sheet for Healthcare Providers: IncredibleEmployment.be  This test is not yet approved or cleared by the [place] FDA and has been authorized for detection and/or diagnosis of SARS-CoV-2 by FDA under an Emergency Use Authorization (EUA). This EUA will remain in effect (meaning this test can be used) for the duration of the COVID-19 declaration under Section 564(b)(1) of the Act, 21 U.S.C. section 360bbb-3(b)(1), unless the authorization is terminated or revoked.  Performed at [facility], [place] [address] [place], [place] [address]   Surgical pcr screen     Status: None   Collection Time: 03/13/21  9:54 PM   Specimen: Nasal Mucosa; Nasal Swab  Result Value Ref Range Status   MRSA, PCR NEGATIVE NEGATIVE Final  Staphylococcus aureus NEGATIVE NEGATIVE Final    Comment: (NOTE) The Xpert SA Assay (FDA approved for NASAL specimens in patients 67 years of age and older), is one component of a comprehensive surveillance program. It is not intended to diagnose infection nor to guide or monitor treatment. Performed at [facility], [place] [address] [place], [place] [address]      Scheduled Meds: . sodium chloride   Intravenous Once  . acyclovir  400 mg Oral BID  . bumetanide  0.5 mg Oral Daily  . chlorhexidine  15 mL Mouth/Throat NOW  . cholecalciferol  2,000 Units Oral Daily  . melatonin  10 mg Oral QHS  . rosuvastatin  10 mg Oral Daily  . zolpidem  5 mg Oral QHS   Continuous Infusions: . heparin 1,100 Units/hr (03/15/21 1854)  . lactated ringers       LOS: 3 days   Cherene Altes, MD [facility] Office  [phone] Pager - Text Page per Amion  If 7PM-7AM, please contact night-coverage per Amion 03/16/2021, 9:39 AM

## 2021-03-16 NOTE — Discharge Instructions (Addendum)
Information on my medicine - ELIQUIS (apixaban)  This medication education was reviewed with me or my healthcare representative as part of my discharge preparation.  The pharmacist that spoke with me during my hospital stay was:  Donnamae Jude, [facility]  Why was Eliquis prescribed for you? Eliquis was prescribed to treat blood clots that may have been found in the veins of your legs (deep vein thrombosis) or in your lungs (pulmonary embolism) and to reduce the risk of them occurring again.  What do You need to know about Eliquis ? The starting dose is 10 mg (two 5 mg tablets) taken TWICE daily for the FIRST SEVEN (7) DAYS, then on (enter date)  03/21/21  the dose is reduced to ONE 5 mg tablet taken TWICE daily.  Eliquis may be taken with or without food.   Try to take the dose about the same time in the morning and in the evening. If you have difficulty swallowing the tablet whole please discuss with your pharmacist how to take the medication safely.  Take Eliquis exactly as prescribed and DO NOT stop taking Eliquis without talking to the doctor who prescribed the medication.  Stopping may increase your risk of developing a new blood clot.  Refill your prescription before you run out.  After discharge, you should have regular check-up appointments with your healthcare provider that is prescribing your Eliquis.    What do you do if you miss a dose? If a dose of ELIQUIS is not taken at the scheduled time, take it as soon as possible on the same day and twice-daily administration should be resumed. The dose should not be doubled to make up for a missed dose.  Important Safety Information A possible side effect of Eliquis is bleeding. You should call your healthcare provider right away if you experience any of the following: ? Bleeding from an injury or your nose that does not stop. ? Unusual colored urine (red or dark brown) or unusual colored stools (red or black). ? Unusual bruising for  unknown reasons. ? A serious fall or if you hit your head (even if there is no bleeding).  Some medicines may interact with Eliquis and might increase your risk of bleeding or clotting while on Eliquis. To help avoid this, consult your healthcare provider or pharmacist prior to using any new prescription or non-prescription medications, including herbals, vitamins, non-steroidal anti-inflammatory drugs (NSAIDs) and supplements.  This website has more information on Eliquis (apixaban): http://www.eliquis.com/eliquis/home   ====================================================   Deep Vein Thrombosis    Deep vein thrombosis (DVT) is a condition in which a blood clot forms in a deep vein, such as a lower leg, thigh, or arm vein. A clot is blood that has thickened into a gel or solid. This condition is dangerous. It can lead to serious and even life-threatening complications if the clot travels to the lungs and causes a blockage (pulmonary embolism). It can also damage veins in the leg. This can result in leg pain, swelling, discoloration, and sores (post-thrombotic syndrome).  What are the causes? This condition may be caused by:  A slowdown of blood flow.  Damage to a vein.  A condition that causes blood to clot more easily, such as an inherited clotting disorder.   What increases the risk? The following factors may make you more likely to develop this condition: 1. Being overweight. 2. Being older, especially over age 5. 67. Sitting or lying down for more than four hours. 4. Being in the  hospital. 5. Lack of physical activity (sedentary lifestyle). 6. Pregnancy, being in childbirth, or having recently given birth. 7. Taking medicines that contain estrogen, such as medicines to prevent pregnancy. 8. Smoking. 9. A history of any of the following: ? Blood clots or a blood clotting disease. ? Peripheral vascular disease. ? Inflammatory bowel disease. ? Cancer. ? Heart  disease. ? Genetic conditions that affect how your blood clots, such as Factor V Leiden mutation. ? Neurological diseases that affect your legs (leg paresis). ? A recent injury, such as a car accident. ? Major or lengthy surgery. ? A central line placed inside a large vein.  What are the signs or symptoms? Symptoms of this condition include:  Swelling, pain, or tenderness in an arm or leg.  Warmth, redness, or discoloration in an arm or leg. If the clot is in your leg, symptoms may be more noticeable or worse when you stand or walk. Some people may not develop any symptoms.  How is this diagnosed? This condition is diagnosed with: 1. A medical history and physical exam. 2. Tests, such as: ? Blood tests. These are done to check how well your blood clots. ? Ultrasound. This is done to check for clots. ? Venogram. For this test, contrast dye is injected into a vein and X-rays are taken to check for any clots  How is this treated? Treatment for this condition depends on:  The cause of your DVT.  Your risk for bleeding or developing more clots.  Any other medical conditions that you have. Treatment may include: 1. Taking a blood thinner (anticoagulant). This type of medicine prevents clots from forming. It may be taken by mouth, injected under the skin, or injected through an IV (catheter). 2. Injecting clot-dissolving medicines into the affected vein (catheter-directed thrombolysis). 3. Having surgery. Surgery may be done to: ? Remove the clot. ? Place a filter in a large vein to catch blood clots before they reach the lungs. Some treatments may be continued for up to six months.  Follow these instructions at home: If you are taking blood thinners: 1. Take the medicine exactly as told by your health care provider. Some blood thinners need to be taken at the same time every day. Do not skip a dose. 2. Talk with your health care provider before you take any medicines that contain  aspirin or NSAIDs. These medicines increase your risk for dangerous bleeding. 3. Ask your health care provider about foods and drugs that could change the way the medicine works (may interact). Avoid those things if your health care provider tells you to do so. 4. Blood thinners can cause easy bruising and may make it difficult to stop bleeding. Because of this: ? Be very careful when using knives, scissors, or other sharp objects. ? Use an electric razor instead of a blade. ? Avoid activities that could cause injury or bruising, and follow instructions about how to prevent falls. 5. Wear a medical alert bracelet or carry a card that lists what medicines you take.  General instructions  Take over-the-counter and prescription medicines only as told by your health care provider.  Return to your normal activities as told by your health care provider. Ask your health care provider what activities are safe for you.  Wear compression stockings if recommended by your health care provider.  Keep all follow-up visits as told by your health care provider. This is important.  How is this prevented? To lower your risk of developing this condition  again: 1. For 30 or more minutes every day, do an activity that: ? Involves moving your arms and legs. ? Increases your heart rate. 2. When traveling for longer than four hours: ? Exercise your arms and legs every hour. ? Drink plenty of water. ? Avoid drinking alcohol. 3. Avoid sitting or lying for a long time without moving your legs. 4. If you have surgery or you are hospitalized, ask about ways to prevent blood clots. These may include taking frequent walks or using anticoagulants. 5. Stay at a healthy weight. 6. If you are a woman who is older than age 64, avoid unnecessary use of medicines that contain estrogen, such as some birth control pills. 7. Do not use any products that contain nicotine or tobacco, such as cigarettes and e-cigarettes. This is  especially important if you take estrogen medicines. If you need help quitting, ask your health care provider.  Contact a health care provider if:  You miss a dose of your blood thinner.  Your menstrual period is heavier than usual.  You have unusual bruising.  Get help right away if: 1. You have: ? New or increased pain, swelling, or redness in an arm or leg. ? Numbness or tingling in an arm or leg. ? Shortness of breath. ? Chest pain. ? A rapid or irregular heartbeat. ? A severe headache or confusion. ? A cut that will not stop bleeding. 2. There is blood in your vomit, stool, or urine. 3. You have a serious fall or accident, or you hit your head. 4. You feel light-headed or dizzy. 5. You cough up blood.  These symptoms may represent a serious problem that is an emergency. Do not wait to see if the symptoms will go away. Get medical help right away. Call your local emergency services (911 in the [place] Do not drive yourself to the hospital. Summary  Deep vein thrombosis (DVT) is a condition in which a blood clot forms in a deep vein, such as a lower leg, thigh, or arm vein.  Symptoms can include swelling, warmth, pain, and redness in your leg or arm.  This condition may be treated with a blood thinner (anticoagulant medicine), medicine that is injected to dissolve blood clots,compression stockings, or surgery.  If you are prescribed blood thinners, take them exactly as told. This information is not intended to replace advice given to you by your health care provider. Make sure you discuss any questions you have with your health care provider. Document Revised: 11/22/2017 Document Reviewed: 05/10/2017 Elsevier Patient Education  [place].

## 2021-03-16 NOTE — Progress Notes (Signed)
Pt stable and alert. No changes in pt condition. Vitals were Rechecked 15 minutes after, turning her back to green. Charge RN was notified, but was told by charge RN to still implement mews guidelines if they were taken at a resting state. Vitals will be rechecked in two hours. Pt has had soft blood pressures during her stay.   03/16/21 0827  Assess: MEWS Score  Temp 98 F (36.7 C)  BP (!) 97/56  Pulse Rate (!) 109  Resp 20  Level of Consciousness Alert  SpO2 98 %  O2 Device Room Air  Assess: MEWS Score  MEWS Temp 0  MEWS Systolic 1  MEWS Pulse 1  MEWS RR 0  MEWS LOC 0  MEWS Score 2  MEWS Score Color Yellow  Assess: if the MEWS score is Yellow or Red  Were vital signs taken at a resting state? Yes  Focused Assessment No change from prior assessment  Early Detection of Sepsis Score *See Row Information* Low  MEWS guidelines implemented *See Row Information* Yes  Treat  Pain Scale 0-10  Pain Score 0  Take Vital Signs  Increase Vital Sign Frequency  Yellow: Q 2hr X 2 then Q 4hr X 2, if remains yellow, continue Q 4hrs  Escalate  MEWS: Escalate Yellow: discuss with charge nurse/RN and consider discussing with provider and RRT  Notify: Charge Nurse/RN  Name of Charge Nurse/RN Notified Manuela Schwartz RN  Date Charge Nurse/RN Notified 03/16/21  Time Charge Nurse/RN Notified 0840  Document  Patient Outcome Other (Comment)

## 2021-03-17 DIAGNOSIS — N179 Acute kidney failure, unspecified: Secondary | ICD-10-CM | POA: Diagnosis not present

## 2021-03-17 DIAGNOSIS — E871 Hypo-osmolality and hyponatremia: Secondary | ICD-10-CM | POA: Diagnosis not present

## 2021-03-17 DIAGNOSIS — I824Y2 Acute embolism and thrombosis of unspecified deep veins of left proximal lower extremity: Secondary | ICD-10-CM | POA: Diagnosis not present

## 2021-03-17 LAB — BPAM RBC
Blood Product Expiration Date: 202204072359
Blood Product Expiration Date: 202204192359
Blood Product Expiration Date: 202204232359
ISSUE DATE / TIME: 202203232308
ISSUE DATE / TIME: 202203240208
Unit Type and Rh: 5100
Unit Type and Rh: 600
Unit Type and Rh: 6200

## 2021-03-17 LAB — CBC
HCT: 26 % — ABNORMAL LOW (ref 36.0–46.0)
Hemoglobin: 9 g/dL — ABNORMAL LOW (ref 12.0–15.0)
MCH: 32.4 pg (ref 26.0–34.0)
MCHC: 34.6 g/dL (ref 30.0–36.0)
MCV: 93.5 fL (ref 80.0–100.0)
Platelets: 196 10*3/uL (ref 150–400)
RBC: 2.78 MIL/uL — ABNORMAL LOW (ref 3.87–5.11)
RDW: 14.8 % (ref 11.5–15.5)
WBC: 5.3 10*3/uL (ref 4.0–10.5)
nRBC: 0 % (ref 0.0–0.2)

## 2021-03-17 LAB — TYPE AND SCREEN
ABO/RH(D): A POS
Antibody Screen: POSITIVE
Unit division: 0
Unit division: 0
Unit division: 0

## 2021-03-17 LAB — BASIC METABOLIC PANEL
Anion gap: 7 (ref 5–15)
BUN: 14 mg/dL (ref 8–23)
CO2: 23 mmol/L (ref 22–32)
Calcium: 8.2 mg/dL — ABNORMAL LOW (ref 8.9–10.3)
Chloride: 95 mmol/L — ABNORMAL LOW (ref 98–111)
Creatinine, Ser: 1.04 mg/dL — ABNORMAL HIGH (ref 0.44–1.00)
GFR, Estimated: 56 mL/min — ABNORMAL LOW (ref >60)
Glucose, Bld: 97 mg/dL (ref 70–99)
Potassium: 4 mmol/L (ref 3.5–5.1)
Sodium: 125 mmol/L — ABNORMAL LOW (ref 135–145)

## 2021-03-17 NOTE — Progress Notes (Signed)
Physical Therapy Treatment Patient Details Name: Wendy [name]. [place] MRN: [id number] DOB: 11/23/1946 Today's Date: 03/17/2021    History of Present Illness 75 y.o. female admitted 3/21 for weakness, leg swelling and mechanical fall. 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  Pt is still not able to climb steps and requiring help again on walker, so DC plan was updated to SNF for more assistance in a short time frame.  Should be ready to go home in a week after admission to SNF, which gives her time to decrease help on walking and get up the stairs for dc to her normal home environment.  Pt has supportive family who should be helpful in the transition, and will expect her to make steady progress now.  Follow acutely for goals of PT.   Follow Up Recommendations  SNF     Equipment Recommendations  Rolling walker with 5" wheels    Recommendations for Other Services       Precautions / Restrictions Precautions Precautions: Fall Precaution Comments: monitor skin on BLE's and edema Restrictions Weight Bearing Restrictions: No    Mobility  Bed Mobility Overal bed mobility: Modified Independent             General bed mobility comments: HOB elevated    Transfers Overall transfer level: Needs assistance Equipment used: Rolling walker (2 wheeled) Transfers: Sit to/from Stand Sit to Stand: Min guard            Ambulation/Gait Ambulation/Gait assistance: Min assist;Min guard Gait Distance (Feet): 30 Feet (15 x 2) Assistive device: Rolling walker (2 wheeled) Gait Pattern/deviations: Step-through pattern;Wide base of support;Trunk  flexed Gait velocity: reduced Gait velocity interpretation: <1.31 ft/sec, indicative of household ambulator General Gait Details: wide based gait to stand and get to walker, then narrows it   Chief Strategy Officer    Modified Rankin (Stroke Patients Only)       Balance Overall balance assessment: Needs assistance Sitting-balance support: Feet supported Sitting balance-Leahy Scale: Good     Standing balance support: Bilateral upper extremity supported;During functional activity Standing balance-Leahy Scale: Fair                              Cognition Arousal/Alertness: Awake/alert Behavior During Therapy: WFL for tasks assessed/performed Overall Cognitive Status: Within Functional Limits for tasks assessed                                        Exercises General Exercises - Lower Extremity Ankle Circles/Pumps: AROM;Both;5 reps Quad Sets: AROM;Both;10 reps Gluteal Sets: AROM;Both;10 reps    General Comments General comments (skin integrity, edema, etc.): BLE serosanguinous drainage on the bed from yesterday      Pertinent Vitals/Pain Pain Assessment: 0-10 Pain Score: 2  Pain Location: LLE Pain Descriptors / Indicators: Guarding Pain Intervention(s): Monitored during session;Repositioned    Home Living  Prior Function            PT Goals (current goals can now be found in the care plan section) Acute Rehab PT Goals Patient Stated Goal: to go back home Progress towards PT goals: Progressing toward goals    Frequency    Min 3X/week      PT Plan Discharge plan needs to be updated    Co-evaluation              AM-PAC PT "6 Clicks" Mobility   Outcome Measure  Help needed turning from your back to your side while in a flat bed without using bedrails?: None Help needed moving from lying on your back to sitting on the side of a flat bed without using bedrails?:  None Help needed moving to and from a bed to a chair (including a wheelchair)?: A Little Help needed standing up from a chair using your arms (e.g., wheelchair or bedside chair)?: A Little Help needed to walk in hospital room?: A Little Help needed climbing 3-5 steps with a railing? : Total 6 Click Score: 18    End of Session Equipment Utilized During Treatment: Gait belt Activity Tolerance: Patient tolerated treatment well Patient left: in bed;with call bell/phone within reach;with bed alarm set Nurse Communication: Mobility status PT Visit Diagnosis: Unsteadiness on feet (R26.81);Muscle weakness (generalized) (M62.81)     Time: 2111-5520 PT Time Calculation (min) (ACUTE ONLY): 30 min  Charges:  $Gait Training: 8-22 mins $Therapeutic Exercise: 8-22 mins                    Ramond Dial 03/17/2021, 5:23 PM Mee Hives, PT MS Acute Rehab Dept. Number: [place] and [place]

## 2021-03-17 NOTE — Evaluation (Signed)
Occupational Therapy Evaluation Patient Details Name: Wendy [name]. Wendy [name] MRN: [id number] DOB: 1946-11-25 Today's Date: 03/17/2021    History of Present Illness 75 y.o. female admitted 3/21 for weakness, leg swelling and mechanical fall. 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  PMH: amyloidosis, HTN, HLD, old T10 compression fracture   Clinical Impression   PTA, pt was living at home alone, pt reports she was independent with ADL/IADL and functional mobility. Pt currently requires supervision for ADL completion in standing and for functional mobility. Pt reports she has family/friends that can come assist her as needed. Feel pt will continue to progress toward baseline with OOB ADL completion and mobilization. Educated pt on importance of mobility and BLE HEP with provided handout. Do not anticipate pt to require follow-up OT after d/c.  Due to decline in current level of function, pt would benefit from acute OT to address established goals to facilitate safe d/c home. Will continue to follow acutely.     Follow Up Recommendations  No OT follow up    Equipment Recommendations  None recommended by OT    Recommendations for Other Services       Precautions / Restrictions Precautions Precautions: Fall Precaution Comments: bilat LE edema up t/o hips/pelvis Restrictions Weight Bearing Restrictions: No      Mobility Bed Mobility Overal bed mobility: Modified Independent             General bed mobility comments: use of bed rails    Transfers Overall transfer level: Needs assistance Equipment used: Rolling walker (2 wheeled) Transfers: Sit to/from Stand Sit to Stand: Supervision         General transfer comment: supervision for safety, pt able to powerup into standing independently    Balance Overall  balance assessment: Needs assistance Sitting-balance support: Feet supported Sitting balance-Leahy Scale: Good     Standing balance support: Single extremity supported Standing balance-Leahy Scale: Fair Standing balance comment: reliant on at least single UE support for stability, able to stand without UE support                           ADL either performed or assessed with clinical judgement   ADL Overall ADL's : Needs assistance/impaired Eating/Feeding: Independent;Sitting   Grooming: Supervision/safety;Standing   Upper Body Bathing: Supervision/ safety;Sitting   Lower Body Bathing: Supervison/ safety;Sit to/from stand   Upper Body Dressing : Independent;Sitting   Lower Body Dressing: Supervision/safety;Sit to/from stand   Toilet Transfer: Supervision/safety;Ambulation;RW;Regular Glass blower/designer Details (indicate cue type and reason): pt attempted to void but unable at this time, reports she went a bit before OT arrival [place] and Hygiene: Supervision/safety;Sit to/from stand       Functional mobility during ADLs: Supervision/safety;Rolling walker General ADL Comments: pt limited by decreased activity tolerance, discomfort/pain in BLE, mobility limitations secondary to BLE ROM limitations     Vision         Perception     Praxis      Pertinent Vitals/Pain Pain Assessment: 0-10 Pain Score: 2  Pain Location: LLE Pain Descriptors / Indicators: Aching;Burning Pain Intervention(s): Monitored during session;Limited activity within patient's tolerance     Hand Dominance Right   Extremity/Trunk Assessment Upper Extremity Assessment Upper Extremity Assessment: Overall WFL for tasks assessed   Lower Extremity  Assessment Lower Extremity Assessment: Generalized weakness;RLE deficits/detail;LLE deficits/detail RLE Deficits / Details: limited knee flexion due to discomfort; LLE Deficits / Details: limited knee flexion due  to discomfort;   Cervical / Trunk Assessment Cervical / Trunk Assessment: Normal   Communication Communication Communication: No difficulties   Cognition Arousal/Alertness: Awake/alert Behavior During Therapy: WFL for tasks assessed/performed Overall Cognitive Status: Within Functional Limits for tasks assessed                                 General Comments: good safety awareness, safe use of RW   General Comments  noted edema in BLE    Exercises Exercises: General Lower Extremity General Exercises - Lower Extremity Ankle Circles/Pumps: AROM;5 reps Quad Sets: AROM;10 reps Heel Slides: AROM;10 reps   Shoulder Instructions      Home Living Family/patient expects to be discharged to:: Private residence Living Arrangements: Alone Available Help at Discharge: Family;Available PRN/intermittently Type of Home: House Home Access: Stairs to enter [employer] of Steps: 5 Entrance Stairs-Rails: Right Home Layout: Two level Alternate Level Stairs-Number of Steps: 14   Bathroom Shower/Tub: Teacher, early years/pre: Standard     Home Equipment: None          Prior Functioning/Environment Level of Independence: Independent        Comments: pt reports she has family/friends to assist prn        OT Problem List: Decreased range of motion;Decreased activity tolerance;Impaired balance (sitting and/or standing);Pain;Increased edema      OT Treatment/Interventions: Self-care/ADL training;Therapeutic exercise;DME and/or AE instruction;Therapeutic activities;Balance training;Patient/family education    OT Goals(Current goals can be found in the care plan section) Acute Rehab OT Goals Patient Stated Goal: to go back home OT Goal Formulation: With patient Time For Goal Achievement: 03/31/21 Potential to Achieve Goals: Good ADL Goals Pt Will Perform Grooming: with modified independence;standing Pt Will Perform Lower Body Dressing: with  modified independence;sit to/from stand Pt Will Transfer to Toilet: with modified independence;ambulating  OT Frequency: Min 2X/week   Barriers to D/C:            Co-evaluation              AM-PAC OT "6 Clicks" Daily Activity     Outcome Measure Help from another person eating meals?: None Help from another person taking care of personal grooming?: A Little Help from another person toileting, which includes using toliet, bedpan, or urinal?: A Little Help from another person bathing (including washing, rinsing, drying)?: A Little Help from another person to put on and taking off regular upper body clothing?: None Help from another person to put on and taking off regular lower body clothing?: A Little 6 Click Score: 20   End of Session Equipment Utilized During Treatment: Rolling walker Nurse Communication: Mobility status  Activity Tolerance: Patient tolerated treatment well Patient left: in chair;with call bell/phone within reach  OT Visit Diagnosis: Other abnormalities of gait and mobility (R26.89);Pain;History of falling (Z91.81) Pain - Right/Left:  (bilateral) Pain - part of body: Leg                Time: 4315-4008 OT Time Calculation (min): 38 min Charges:  OT General Charges $OT Visit: 1 Visit OT Evaluation $OT Eval Moderate Complexity: 1 Mod OT Treatments $Self Care/Home Management : 8-22 mins  Helene Kelp OTR/L Acute Rehabilitation Services Office: [phone]   Wyn Forster 03/17/2021, 9:53 AM

## 2021-03-17 NOTE — Progress Notes (Signed)
  Progress Note    03/17/2021 12:41 PM 2 Days Post-Op  Subjective: Swelling much improved  Vitals:   03/17/21 0402 03/17/21 0734  BP: 137/70 136/72  Pulse: (!) 109 (!) 107  Resp: 18 18  Temp: 98.5 F (36.9 C) 97.8 F (36.6 C)  SpO2: 99% 97%    Physical Exam: Awake alert oriented Nonlabored respirations Left leg swelling much improved, right leg without any edema Suture bolster was removed from behind both knees without any evidence of bleeding  CBC    Component Value Date/Time   WBC 5.3 03/17/2021 0226   RBC 2.78 (L) 03/17/2021 0226   HGB 9.0 (L) 03/17/2021 0226   HGB 10.6 (L) 03/09/2021 0800   HCT 26.0 (L) 03/17/2021 0226   PLT 196 03/17/2021 0226   PLT 252 03/09/2021 0800   MCV 93.5 03/17/2021 0226   MCH 32.4 03/17/2021 0226   MCHC 34.6 03/17/2021 0226   RDW 14.8 03/17/2021 0226   LYMPHSABS 0.3 (L) 03/13/2021 1317   MONOABS 0.5 03/13/2021 1317   EOSABS 0.0 03/13/2021 1317   BASOSABS 0.0 03/13/2021 1317    BMET    Component Value Date/Time   NA 125 (L) 03/17/2021 0226   NA 123 (L) 11/03/2020 1103   K 4.0 03/17/2021 0226   CL 95 (L) 03/17/2021 0226   CO2 23 03/17/2021 0226   GLUCOSE 97 03/17/2021 0226   BUN 14 03/17/2021 0226   BUN 10 11/03/2020 1103   CREATININE 1.04 (H) 03/17/2021 0226   CREATININE 1.15 (H) 03/09/2021 0800   CALCIUM 8.2 (L) 03/17/2021 0226   GFRNONAA 56 (L) 03/17/2021 0226   GFRNONAA 49 (L) 03/09/2021 0800   GFRAA 93 11/03/2020 1103    INR    Component Value Date/Time   INR 1.0 03/13/2021 1432     Intake/Output Summary (Last 24 hours) at 03/17/2021 1241 Last data filed at 03/17/2021 0500 Gross per 24 hour  Intake 240 ml  Output 700 ml  Net -460 ml     Assessment:  75 y.o. female is s/p IVC thrombectomy removal of foreign body bilateral lower extremity venous mechanical thrombectomy  Plan: X-ray reviewed foreign body appears within soft tissue We will fit for thigh-high compression Okay for discharge from vascular  standpoint on anticoagulation She will need to be anticoagulated at least until the completion of her chemotherapy and probably 3 to 6 months after that as well.   Sherlin Sonier C. Donzetta Matters, MD [facility] Office: [phone] Pager: [phone]  03/17/2021 12:41 PM

## 2021-03-17 NOTE — Progress Notes (Signed)
Bilateral drain sites behind both knees appear to be healing.  Scant amount of serous drainage noted on bed linens.  The left upper leg appears warm and flushed (red) when checked

## 2021-03-17 NOTE — Plan of Care (Signed)

## 2021-03-17 NOTE — TOC Progression Note (Signed)
Transition of Care (TOC) - Progression Note    Patient Details  Name: Wendy [name] MRN: [id number] Date of Birth: 20-May-1946  Transition of Care [facility]) CM/SW Contact  Milinda Antis, [place] Phone Number: 03/17/2021, 2:39 PM  Clinical Narrative:    CSW received a call from patient and patient's daughter Amy in reference to SNF preferences.  Family reports wanting the patient placed at a facility with 4 stars or better.  Preferences include [facility], [facility], [facility], and [facility].   Still waiting for bed offers from one of the above facilities.     Expected Discharge Plan: [place] Barriers to Discharge: SNF Pending bed offer,Continued Medical Work up  Expected Discharge Plan and Services Expected Discharge Plan: [place] arrangements for the past 2 months: Single Family Home                                       Social Determinants of Health (SDOH) Interventions    Readmission Risk Interventions No flowsheet data found.

## 2021-03-17 NOTE — NC FL2 (Signed)
[place] LEVEL OF CARE SCREENING TOOL     IDENTIFICATION  Patient Name: Wendy [name]. Carmicheal Birthdate: 05/14/1946 Sex: female Admission Date (Current Location): 03/13/2021  [place] and [place] Number:  Herbalist and Address:  [facility] [place], [place] [address], [place], [place] [address]      Provider Number: [id number]  Attending Physician Name and Address:  Cherene Altes, MD  Relative Name and Phone Number:  Darnell Level [phone]    Current Level of Care: Hospital Recommended Level of Care: [place] Prior Approval Number:    Date Approved/Denied:   PASRR Number: [id number]  Discharge Plan: SNF    Current Diagnoses: Patient Active Problem List   Diagnosis Date Noted  . Compression fracture of T10 vertebra ([place]) 03/14/2021  . Fall at home, initial encounter 03/14/2021  . DVT (deep venous thrombosis) ([place]) 03/13/2021  . Multiple myeloma ([place]) 02/10/2021  . Leucocytosis 02/10/2021  . 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 Situational anxiety 01/11/2021  . 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  . Hyperlipidemia 10/19/2020  . Hearing loss 06/14/2020  . Pedal edema 06/01/2020  . Aortic atherosclerosis ([place]) 04/06/2020  . CAD (coronary artery disease) 04/06/2020  . H/O compression fracture of spine 04/06/2020  . Chronic back pain 04/06/2020  . Pulmonary nodules 03/17/2020  . Bloating 06/02/2018  . Heartburn 06/02/2018  . Chronic constipation 12/31/2017  . Blood glucose elevated 09/25/2017  . History of ileus 06/07/2017  . Right carpal tunnel syndrome 12/07/2016  . Estrogen deficiency 09/24/2016  . Routine general medical examination at a health care facility 09/04/2015  . Colon cancer screening 12/11/2014  . Encounter for Medicare annual wellness exam 05/17/2013  . Osteoarthritis 03/28/2011  . Degenerative disc disease, lumbar 03/28/2011  . Hyponatremia 02/12/2011  . Essential hypertension  08/01/2010  . Osteoporosis 08/01/2010    Orientation RESPIRATION BLADDER Height & Weight     Self,Time,Situation,Place  Normal Continent Weight: 134 lb (60.8 kg) Height:  '5\' 2"'  (157.5 cm)  BEHAVIORAL SYMPTOMS/MOOD NEUROLOGICAL BOWEL NUTRITION STATUS      Continent Diet (see dc summary)  AMBULATORY STATUS COMMUNICATION OF NEEDS Skin   Limited Assist Verbally Surgical wounds (Closed incision right and left leg 03/15/21)                       Personal Care Assistance Level of Assistance  Bathing,Feeding,Dressing Bathing Assistance: Limited assistance Feeding assistance: Independent Dressing Assistance: Limited assistance     Functional Limitations Info  Sight,Hearing,Speech Sight Info: Adequate Hearing Info: Adequate Speech Info: Adequate    SPECIAL CARE FACTORS FREQUENCY  PT (By licensed PT),OT (By licensed OT)     PT Frequency: 5x per week OT Frequency: 5x per week            Contractures Contractures Info: Not present    Additional Factors Info  Code Status,Allergies,Psychotropic Code Status Info: Full Allergies Info: Bentyl (Dicyclomine Hcl)   Butalbital-aspirin-caffeine   Clonidine Derivatives   Linzess (Linaclotide)   Morphine And Related   Motrin (Ibuprofen)   Penicillins   Sulfa Antibiotics   Zanaflex (Tizanidine Hcl)   Diphenhydramine Hcl Psychotropic Info: zolpidem (AMBIEN) tablet 5 mg once nightly         Current Medications (03/17/2021):  This is the current hospital active medication list Current Facility-Administered Medications  Medication Dose Route Frequency Provider Last Rate Last Admin  . acetaminophen (TYLENOL) tablet 650 mg  650 mg Oral Q6H PRN Cherene Altes,  MD   650 mg at 03/16/21 2247  . acyclovir (ZOVIRAX) tablet 400 mg  400 mg Oral BID Waynetta Sandy, MD   400 mg at 03/16/21 2206  . albuterol (PROVENTIL) (2.5 MG/3ML) 0.083% nebulizer solution 2.5 mg  2.5 mg Nebulization Q6H PRN Waynetta Sandy, MD      .  ALPRAZolam Duanne Moron) tablet 0.25 mg  0.25 mg Oral Daily PRN Waynetta Sandy, MD      . apixaban Arne Cleveland) tablet 10 mg  10 mg Oral BID Donnamae Jude, RPH   10 mg at 03/16/21 2206   Followed by  . [START ON 03/21/2021] apixaban (ELIQUIS) tablet 5 mg  5 mg Oral BID Donnamae Jude, [facility]      . calcium carbonate (TUMS - dosed in mg elemental calcium) chewable tablet 200-400 mg of elemental calcium  1-2 tablet Oral QID PRN Cherene Altes, MD   400 mg of elemental calcium at 03/16/21 1849  . cholecalciferol (VITAMIN D3) tablet 2,000 Units  2,000 Units Oral Daily Waynetta Sandy, MD   2,000 Units at 03/16/21 1001  . guaiFENesin (ROBITUSSIN) 100 MG/5ML solution 100 mg  5 mL Oral Q4H PRN Cherene Altes, MD   100 mg at 03/16/21 2247  . hydroxypropyl methylcellulose / hypromellose (ISOPTO TEARS / GONIOVISC) 2.5 % ophthalmic solution 1 drop  1 drop Both Eyes PRN Waynetta Sandy, MD      . melatonin tablet 10 mg  10 mg Oral QHS Waynetta Sandy, MD   10 mg at 03/16/21 2206  . ondansetron (ZOFRAN) tablet 4 mg  4 mg Oral Q6H PRN Waynetta Sandy, MD       Or  . ondansetron [facility]) injection 4 mg  4 mg Intravenous Q6H PRN Waynetta Sandy, MD      . phenol [facility]) mouth spray 1 spray  1 spray Mouth/Throat PRN Cherene Altes, MD   1 spray at 03/16/21 2247  . rosuvastatin (CRESTOR) tablet 10 mg  10 mg Oral Daily Waynetta Sandy, MD   10 mg at 03/16/21 1001  . traMADol (ULTRAM) tablet 50 mg  50 mg Oral Daily PRN Waynetta Sandy, MD   50 mg at 03/16/21 1532  . zolpidem (AMBIEN) tablet 5 mg  5 mg Oral QHS Waynetta Sandy, MD   5 mg at 03/16/21 2206     Discharge Medications: Please see discharge summary for a list of discharge medications.  Relevant Imaging Results:  Relevant Lab Results:   Additional Information SSN [id number]  Loreta Ave, LCSWA

## 2021-03-17 NOTE — Progress Notes (Signed)
Wendy [name]  OMA:[id number] DOB: 10-Aug-1946 DOA: 03/13/2021 PCP: Abner Greenspan, MD    Brief Narrative:  12EL with a hx of amyloidosis, HTN, HLD, and hyponatremia who was admitted after she was found to have bilateral lower extremity DVTs extending all the way into the inferior vena cava.   Antimicrobials:  Vancomycin 3/23  DVT prophylaxis: IV heparin  Consultants:  none  Subjective: Afebrile.  Vital signs stable.  Heart rate improving and presently 100-109.  Saturations 97% on room air.  Reports difficulty with ambulating and some expected pain in her lower extremities, but states that overall she feels much better.  Denies chest pain shortness of breath nausea or vomiting.  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 Louanne Skye and has had a vertebroplasty - migrated cement found partially occluding IVC at time of vascular procedure   HLD Continue Crestor  Amyloidosis Followed by hematology and nephrology with patient actively receiving chemotherapy at this time  Acute kidney injury Baseline creatinine 0.7 -creatinine 1.2 at admission - slowly improving w/ volume expansion   Anemia due to chronic disease Baseline hemoglobin 9.5-10 - continue to monitor -no gross blood loss evident at present - s/p transfusion of 2U PRBC this admit   Dispostion  Medically stable for d/c - awaiting SNF rehab bed availability    Code Status: FULL CODE Family Communication:  Status is: Inpatient  Remains inpatient appropriate because:Inpatient level of care appropriate due to severity of illness   Dispo: The patient is from:  Home              Anticipated d/c is to: SNF              Patient currently is medically stable to d/c.   Difficult to place patient No    Objective: Blood pressure 136/72, pulse (!) 107, temperature 97.8 F (36.6 C), temperature source Oral, resp. rate 18, height 5\' 2"  (1.575 m), weight 60.8 kg, SpO2 97 %.  Intake/Output Summary (Last 24 hours) at 03/17/2021 0907 Last data filed at 03/17/2021 0500 Gross per 24 hour  Intake 480 ml  Output 800 ml  Net -320 ml   Filed Weights   03/13/21 1236 03/15/21 1428  Weight: 60.8 kg 60.8 kg    Examination: General: No acute respiratory distress Lungs: Clear to auscultation B - no wheeze  Cardiovascular: RRR Abdomen: Nondistended, soft, BS positive, no rebound Extremities: 2+ stable edema B LE   CBC: Recent Labs  Lab 03/13/21 1317 03/14/21 0021 03/15/21 0341 03/16/21 0845 03/17/21 0226  WBC 9.4   < > 4.5 5.3 5.3  NEUTROABS 8.6*  --   --   --   --   HGB 9.8*   < > 8.0* 10.8* 9.0*  HCT 27.7*   < > 22.6* 31.2* 26.0*  MCV 96.5   < > 96.2 92.9 93.5  PLT 257   < > 316 268 196   < > = values in this interval not displayed.   Basic Metabolic Panel: Recent Labs  Lab 03/15/21 0341 03/16/21 0845 03/17/21 0226  NA 124* 129* 125*  K 3.8 4.0 4.0  CL 92* 95* 95*  CO2 25 25 23   GLUCOSE 104* 137* 97  BUN 17 15 14   CREATININE 1.14* 1.14* 1.04*  CALCIUM 8.1* 8.2* 8.2*   GFR: Estimated Creatinine Clearance: 39.5 mL/min (A) (by C-G formula based on SCr of 1.04 mg/dL (H)).  Liver Function Tests: Recent Labs  Lab 03/13/21 1317  AST 25  ALT 19  ALKPHOS 86  BILITOT 1.2  PROT 5.6*  ALBUMIN 2.8*    Coagulation Profile: Recent Labs  Lab 03/13/21 1432  INR 1.0     HbA1C: Hgb A1c MFr Bld  Date/Time Value Ref Range Status  10/13/2020 08:00 AM 5.6 4.6 - 6.5 % Final    Comment:    Glycemic Control Guidelines for People with Diabetes:Non Diabetic:  <6%Goal of Therapy: <7%Additional Action Suggested:  >8%   10/12/2019 08:12 AM  5.5 4.6 - 6.5 % Final    Comment:    Glycemic Control Guidelines for People with Diabetes:Non Diabetic:  <6%Goal of Therapy: <7%Additional Action Suggested:  >8%     CBG: Recent Labs  Lab 03/13/21 1430  GLUCAP 104*    Recent Results (from the past 240 hour(s))  Resp Panel by RT-PCR (Flu A&B, Covid) Nasopharyngeal Swab     Status: None   Collection Time: 03/13/21  4:35 PM   Specimen: Nasopharyngeal Swab; Nasopharyngeal(NP) swabs in vial transport medium  Result Value Ref Range Status   SARS Coronavirus 2 by RT PCR NEGATIVE NEGATIVE Final    Comment: (NOTE) SARS-CoV-2 target nucleic acids are NOT DETECTED.  The SARS-CoV-2 RNA is generally detectable in upper respiratory specimens during the acute phase of infection. The lowest concentration of SARS-CoV-2 viral copies this assay can detect is 138 copies/mL. A negative result does not preclude SARS-Cov-2 infection and should not be used as the sole basis for treatment or other patient management decisions. A negative result may occur with  improper specimen collection/handling, submission of specimen other than nasopharyngeal swab, presence of viral mutation(s) within the areas targeted by this assay, and inadequate number of viral copies(<138 copies/mL). A negative result must be combined with clinical observations, patient history, and epidemiological information. The expected result is Negative.  Fact Sheet for Patients:  EntrepreneurPulse.com.au  Fact Sheet for Healthcare Providers:  IncredibleEmployment.be  This test is no t yet approved or cleared by the [place] FDA and  has been authorized for detection and/or diagnosis of SARS-CoV-2 by FDA under an Emergency Use Authorization (EUA). This EUA will remain  in effect (meaning this test can be used) for the duration of the COVID-19 declaration under Section 564(b)(1) of the Act, 21 U.S.C.section 360bbb-3(b)(1), unless the  authorization is terminated  or revoked sooner.       Influenza A by PCR NEGATIVE NEGATIVE Final   Influenza B by PCR NEGATIVE NEGATIVE Final    Comment: (NOTE) The Xpert Xpress SARS-CoV-2/FLU/RSV plus assay is intended as an aid in the diagnosis of influenza from Nasopharyngeal swab specimens and should not be used as a sole basis for treatment. Nasal washings and aspirates are unacceptable for Xpert Xpress SARS-CoV-2/FLU/RSV testing.  Fact Sheet for Patients: EntrepreneurPulse.com.au  Fact Sheet for Healthcare Providers: IncredibleEmployment.be  This test is not yet approved or cleared by the [place] FDA and has been authorized for detection and/or diagnosis of SARS-CoV-2 by FDA under an Emergency Use Authorization (EUA). This EUA will remain in effect (meaning this test can be used) for the duration of the COVID-19 declaration under Section 564(b)(1) of the Act, 21 U.S.C. section 360bbb-3(b)(1), unless the  authorization is terminated or revoked.  Performed at [facility], [place] [address], [place], [place] [address]   Surgical pcr screen     Status: None   Collection Time: 03/13/21  9:54 PM   Specimen: Nasal Mucosa; Nasal Swab  Result Value Ref Range Status   MRSA, PCR NEGATIVE NEGATIVE Final   Staphylococcus aureus NEGATIVE NEGATIVE Final    Comment: (NOTE) The Xpert SA Assay (FDA approved for NASAL specimens in patients 46 years of age and older), is one component of a comprehensive surveillance program. It is not intended to diagnose infection nor to guide or monitor treatment. Performed at [facility], [place] [address], [place], [place] [address]      Scheduled Meds: . acyclovir  400 mg Oral BID  . apixaban  10 mg Oral BID   Followed by  . [START ON 03/21/2021] apixaban  5 mg Oral BID  . cholecalciferol  2,000 Units Oral Daily  . melatonin  10 mg Oral QHS  . rosuvastatin  10 mg Oral Daily  .  zolpidem  5 mg Oral QHS   Continuous Infusions: . lactated ringers       LOS: 4 days   Cherene Altes, MD [facility] Office  [phone] Pager - Text Page per Amion  If 7PM-7AM, please contact night-coverage per Amion 03/17/2021, 9:07 AM

## 2021-03-18 DIAGNOSIS — N179 Acute kidney failure, unspecified: Secondary | ICD-10-CM | POA: Diagnosis not present

## 2021-03-18 DIAGNOSIS — E871 Hypo-osmolality and hyponatremia: Secondary | ICD-10-CM | POA: Diagnosis not present

## 2021-03-18 DIAGNOSIS — I824Y2 Acute embolism and thrombosis of unspecified deep veins of left proximal lower extremity: Secondary | ICD-10-CM | POA: Diagnosis not present

## 2021-03-18 LAB — CBC
HCT: 27.4 % — ABNORMAL LOW (ref 36.0–46.0)
Hemoglobin: 9.5 g/dL — ABNORMAL LOW (ref 12.0–15.0)
MCH: 32.8 pg (ref 26.0–34.0)
MCHC: 34.7 g/dL (ref 30.0–36.0)
MCV: 94.5 fL (ref 80.0–100.0)
Platelets: 211 10*3/uL (ref 150–400)
RBC: 2.9 MIL/uL — ABNORMAL LOW (ref 3.87–5.11)
RDW: 14.6 % (ref 11.5–15.5)
WBC: 5.8 10*3/uL (ref 4.0–10.5)
nRBC: 0 % (ref 0.0–0.2)

## 2021-03-18 MED ORDER — METOPROLOL TARTRATE 12.5 MG HALF TABLET
12.5000 mg | ORAL_TABLET | Freq: Two times a day (BID) | ORAL | Status: DC
  Administered 2021-03-18 – 2021-03-20 (×4): 12.5 mg via ORAL
  Filled 2021-03-18 (×4): qty 1

## 2021-03-18 NOTE — Progress Notes (Signed)
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   Antimicrobials:  Vancomycin 3/23  DVT prophylaxis: IV heparin  Consultants:  none  Subjective: Vital stable.  Mild tachycardia persists.  Saturations 95% on room air.  No new complaints.  Simply awaiting placement within SNF.  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 S/p mechanical thrombectomy of IVC, bilateral common iliac veins, bilateral external iliac veins, and bilateral common femoral veins to include removal of a foreign body from the IVC 3/23 - ambulate w/ PT/OT -transitioned to Eliquis  Mild sinus tachycardia May simply be due to to deconditioning but certainly the patient is at high risk for pulmonary embolism -she is hemodynamically stable and therefore I do not think there is anything to be gained by obtaining CT angiogram chest given that she is going to need prolonged anticoagulation regardless -there are no clinical signs to suggest right heart failure and therefore I feel repeating a TTE (patient have 12/11/21) is not likely to benefit the patient -of note heart rate 100 during office visit 3/17 and 108 02/23/21  Hyponatremia -chronic Sodium is stable  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 Louanne Skye and has had a vertebroplasty - migrated cement found partially occluding IVC at time of vascular procedure   HLD Continue Crestor  Amyloidosis Followed by hematology and nephrology with patient actively receiving chemotherapy at this time  Acute kidney injury Baseline creatinine 0.7 -creatinine 1.2 at admission - slowly improved w/ volume expansion   Anemia due to chronic disease Baseline hemoglobin 9.5-10 - continue to  monitor -no gross blood loss evident at present - s/p transfusion of 2U PRBC this admit   Dispostion  Medically stable for d/c - awaiting SNF rehab bed availability    Code Status: FULL CODE Family Communication:  Status is: Inpatient  Remains inpatient appropriate because:Inpatient level of care appropriate due to severity of illness   Dispo: The patient is from: Home              Anticipated d/c is to: SNF              Patient currently is medically stable to d/c.   Difficult to place patient No    Objective: Blood pressure (!) 152/79, pulse (!) 109, temperature 98.7 F (37.1 C), temperature source Oral, resp. rate 18, height 5\' 2"  (1.575 m), weight 60.8 kg, SpO2 95 %.  Intake/Output Summary (Last 24 hours) at 03/18/2021 0845 Last data filed at 03/18/2021 0747 Gross per 24 hour  Intake -  Output 500 ml  Net -500 ml   Filed Weights   03/13/21 1236 03/15/21 1428  Weight: 60.8 kg 60.8 kg    Examination: General: No acute respiratory distress Lungs: Clear to auscultation B  Cardiovascular: RRR Abdomen: Nondistended, soft, BS positive, no rebound Extremities: 2+ stable edema B LE without erythema  CBC: Recent Labs  Lab 03/13/21 1317 03/14/21 0021 03/16/21 0845 03/17/21 0226 03/18/21 0254  WBC 9.4   < > 5.3 5.3 5.8  NEUTROABS 8.6*  --   --   --   --   HGB 9.8*   < > 10.8* 9.0* 9.5*  HCT 27.7*   < > 31.2* 26.0*  27.4*  MCV 96.5   < > 92.9 93.5 94.5  PLT 257   < > 268 196 211   < > = values in this interval not displayed.   Basic Metabolic Panel: Recent Labs  Lab 03/15/21 0341 03/16/21 0845 03/17/21 0226  NA 124* 129* 125*  K 3.8 4.0 4.0  CL 92* 95* 95*  CO2 25 25 23   GLUCOSE 104* 137* 97  BUN 17 15 14   CREATININE 1.14* 1.14* 1.04*  CALCIUM 8.1* 8.2* 8.2*   GFR: Estimated Creatinine Clearance: 39.5 mL/min (A) (by C-G formula based on SCr of 1.04 mg/dL (H)).  Liver Function Tests: Recent Labs  Lab 03/13/21 1317  AST 25  ALT 19  ALKPHOS 86   BILITOT 1.2  PROT 5.6*  ALBUMIN 2.8*    Coagulation Profile: Recent Labs  Lab 03/13/21 1432  INR 1.0     HbA1C: Hgb A1c MFr Bld  Date/Time Value Ref Range Status  10/13/2020 08:00 AM 5.6 4.6 - 6.5 % Final    Comment:    Glycemic Control Guidelines for People with Diabetes:Non Diabetic:  <6%Goal of Therapy: <7%Additional Action Suggested:  >8%   10/12/2019 08:12 AM 5.5 4.6 - 6.5 % Final    Comment:    Glycemic Control Guidelines for People with Diabetes:Non Diabetic:  <6%Goal of Therapy: <7%Additional Action Suggested:  >8%     CBG: Recent Labs  Lab 03/13/21 1430  GLUCAP 104*    Recent Results (from the past 240 hour(s))  Resp Panel by RT-PCR (Flu A&B, Covid) Nasopharyngeal Swab     Status: None   Collection Time: 03/13/21  4:35 PM   Specimen: Nasopharyngeal Swab; Nasopharyngeal(NP) swabs in vial transport medium  Result Value Ref Range Status   SARS Coronavirus 2 by RT PCR NEGATIVE NEGATIVE Final    Comment: (NOTE) SARS-CoV-2 target nucleic acids are NOT DETECTED.  The SARS-CoV-2 RNA is generally detectable in upper respiratory specimens during the acute phase of infection. The lowest concentration of SARS-CoV-2 viral copies this assay can detect is 138 copies/mL. A negative result does not preclude SARS-Cov-2 infection and should not be used as the sole basis for treatment or other patient management decisions. A negative result may occur with  improper specimen collection/handling, submission of specimen other than nasopharyngeal swab, presence of viral mutation(s) within the areas targeted by this assay, and inadequate number of viral copies(<138 copies/mL). A negative result must be combined with clinical observations, patient history, and epidemiological information. The expected result is Negative.  Fact Sheet for Patients:  EntrepreneurPulse.com.au  Fact Sheet for Healthcare Providers:   IncredibleEmployment.be  This test is no t yet approved or cleared by the [place] FDA and  has been authorized for detection and/or diagnosis of SARS-CoV-2 by FDA under an Emergency Use Authorization (EUA). This EUA will remain  in effect (meaning this test can be used) for the duration of the COVID-19 declaration under Section 564(b)(1) of the Act, 21 U.S.C.section 360bbb-3(b)(1), unless the authorization is terminated  or revoked sooner.       Influenza A by PCR NEGATIVE NEGATIVE Final   Influenza B by PCR NEGATIVE NEGATIVE Final    Comment: (NOTE) The Xpert Xpress SARS-CoV-2/FLU/RSV plus assay is intended as an aid in the diagnosis of influenza from Nasopharyngeal swab specimens and should not be used as a sole basis for treatment. Nasal washings and aspirates are unacceptable for Xpert Xpress SARS-CoV-2/FLU/RSV testing.  Fact Sheet for Patients: EntrepreneurPulse.com.au  Fact Sheet for Healthcare Providers: IncredibleEmployment.be  This test is not yet approved or cleared by the [place] and has been authorized for detection and/or diagnosis of SARS-CoV-2 by FDA under an Emergency Use Authorization (EUA). This EUA will remain in effect (meaning this test can be used) for the duration of the COVID-19 declaration under Section 564(b)(1) of the Act, 21 U.S.C. section 360bbb-3(b)(1), unless the authorization is terminated or revoked.  Performed at [facility], [place] [address], [place], [place] [address]   Surgical pcr screen     Status: None   Collection Time: 03/13/21  9:54 PM   Specimen: Nasal Mucosa; Nasal Swab  Result Value Ref Range Status   MRSA, PCR NEGATIVE NEGATIVE Final   Staphylococcus aureus NEGATIVE NEGATIVE Final    Comment: (NOTE) The Xpert SA Assay (FDA approved for NASAL specimens in patients 79 years of age and older), is one component of a  comprehensive surveillance program. It is not intended to diagnose infection nor to guide or monitor treatment. Performed at [facility], [place] [address] [place], [place] [address]      Scheduled Meds: . acyclovir  400 mg Oral BID  . apixaban  10 mg Oral BID   Followed by  . [START ON 03/21/2021] apixaban  5 mg Oral BID  . cholecalciferol  2,000 Units Oral Daily  . melatonin  10 mg Oral QHS  . rosuvastatin  10 mg Oral Daily  . zolpidem  5 mg Oral QHS      LOS: 5 days   Cherene Altes, MD [facility] Office  [phone] Pager - Text Page per Amion  If 7PM-7AM, please contact night-coverage per Amion 03/18/2021, 8:45 AM

## 2021-03-18 NOTE — TOC Progression Note (Signed)
Transition of Care (TOC) - Progression Note    Patient Details  Name: Wendy [name]. Dwyer MRN: [id number] Date of Birth: December 28, 1945  Transition of Care [facility]) CM/SW Contact  Bartholomew Crews, RN Phone Number: 03/18/2021, 4:27 PM  Clinical Narrative:     Notified by CSW that patient is wanting to transition home and not go to SNF. Spoke with patient on her room phone [phone] to confirm. Discussed home health agency choice - referral accepted by [place] for RN, PT, OT, Aide, SW. Patient will also need DME - RW. Patient will need HH/Face to Face orders for RN, PT, OT, Aide and SW, and DME RW order. Patient aware of accepting home health agency.  TOC following for transition needs.   Expected Discharge Plan: [place] Barriers to Discharge: Continued Medical Work up  Expected Discharge Plan and Services Expected Discharge Plan: [place] arrangements for the past 2 months: Single Family Home                                       Social Determinants of Health (SDOH) Interventions    Readmission Risk Interventions No flowsheet data found.

## 2021-03-18 NOTE — Plan of Care (Signed)
  Problem: Education: Goal: Knowledge of General Education information will improve Description: Including pain rating scale, medication(s)/side effects and non-pharmacologic comfort measures Outcome: Progressing   Problem: Activity: Goal: Risk for activity intolerance will decrease Outcome: Progressing   Problem: Nutrition: Goal: Adequate nutrition will be maintained Outcome: Progressing   

## 2021-03-18 NOTE — Progress Notes (Signed)
Occupational Therapy Treatment Patient Details Name: Wendy [name]. Wendy [name] MRN: [id number] DOB: 1946-02-11 Today's Date: 03/18/2021    History of present illness 75 y.o. female admitted 3/21 for weakness, leg swelling and mechanical fall. 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  PMH: amyloidosis, HTN, HLD, old T10 compression fracture   OT comments  Pt making steady progress towards OT goals this session. Pt continues to present with increased pain, and slight deficits in balance and functional activity tolerance however pt able to mobilize OOB with no physical assistance and completed functional mobility greater than a household distance with RW and min guard assist. Pt currently requires set- up assist for seated grooming tasks. Per pt her family wants her to go to [place] rehab, although feel pt could return home. Will continue to follow acutely per POC and update DC recs as needed.    Follow Up Recommendations  No OT follow up    Equipment Recommendations  None recommended by OT    Recommendations for Other Services      Precautions / Restrictions Precautions Precautions: Fall Precaution Comments: monitor skin on BLE's and edema Restrictions Weight Bearing Restrictions: No       Mobility Bed Mobility Overal bed mobility: Modified Independent             General bed mobility comments: HOB elevated, no physical assist needed    Transfers Overall transfer level: Needs assistance Equipment used: Rolling walker (2 wheeled) Transfers: Sit to/from Stand Sit to Stand: Min guard         General transfer comment: min guard for safety    Balance Overall balance assessment: Needs assistance Sitting-balance support: Feet supported;No upper extremity supported Sitting balance-Leahy Scale: Good     Standing  balance support: Bilateral upper extremity supported;During functional activity Standing balance-Leahy Scale: Poor Standing balance comment: reliant on BUE support during mobility tasks                           ADL either performed or assessed with clinical judgement   ADL Overall ADL's : Needs assistance/impaired     Grooming: Wash/dry face;Oral care;Sitting;Set up                   Toilet Transfer: Min Marine scientist Details (indicate cue type and reason): simulated via functional mobility with Rw and min guard assist         Functional mobility during ADLs: Min guard;Rolling walker General ADL Comments: pt continues to present with increased pain, and slight deficits in balance and functional activity tolerance     Vision       Perception     Praxis      Cognition Arousal/Alertness: Awake/alert Behavior During Therapy: WFL for tasks assessed/performed Overall Cognitive Status: Within Functional Limits for tasks assessed                                          Exercises     Shoulder Instructions       General Comments pt reports she feels like her fall was a "freak accident" as she just stood up from the couch and fell backwards in between coffee table and  couch, HR 117 bpm post mobility with pt on RA and O2 96%    Pertinent Vitals/ Pain       Pain Assessment: Faces Faces Pain Scale: Hurts a little bit Pain Location: back of bilateral legs Pain Descriptors / Indicators: Sore;Aching;Discomfort Pain Intervention(s): Monitored during session;Repositioned  Home Living                                          Prior Functioning/Environment              Frequency  Min 2X/week        Progress Toward Goals  OT Goals(current goals can now be found in the care plan section)  Progress towards OT goals: Progressing toward goals  Acute Rehab OT Goals Patient Stated Goal: to go  back home OT Goal Formulation: With patient Time For Goal Achievement: 03/31/21 Potential to Achieve Goals: Good  Plan Discharge plan remains appropriate;Frequency remains appropriate    Co-evaluation                 AM-PAC OT "6 Clicks" Daily Activity     Outcome Measure   Help from another person eating meals?: None Help from another person taking care of personal grooming?: None Help from another person toileting, which includes using toliet, bedpan, or urinal?: A Little Help from another person bathing (including washing, rinsing, drying)?: A Little Help from another person to put on and taking off regular upper body clothing?: None Help from another person to put on and taking off regular lower body clothing?: A Little 6 Click Score: 21    End of Session Equipment Utilized During Treatment: Gait belt;Rolling walker  OT Visit Diagnosis: Other abnormalities of gait and mobility (R26.89);Pain;History of falling (Z91.81) Pain - Right/Left:  (bilateral) Pain - part of body: Leg   Activity Tolerance Patient tolerated treatment well   Patient Left in chair;with call bell/phone within reach   Nurse Communication Mobility status        Time: 0539-7673 OT Time Calculation (min): 26 min  Charges: OT General Charges $OT Visit: 1 Visit OT Treatments $Self Care/Home Management : 23-37 mins  Wendy [name]., COTA/L Acute Rehabilitation Services [phone]    [name] 03/18/2021, 1:44 PM

## 2021-03-18 NOTE — TOC Progression Note (Signed)
Transition of Care (TOC) - Progression Note    Patient Details  Name: Wendy [name] MRN: [id number] Date of Birth: [birth year]  Transition of Care [facility]) CM/SW [place], [place] Phone Number: 03/18/2021, 4:02 PM  Clinical Narrative:     CSW spoke with patient and provided SNF bed offers. Patient declined SNF. Patient wants to go home with home health services. Patient says she will have assist/supervision by neighbors if needed. SHe also, has daughter that can assist if needed. Patient is willing to pay for extra Home Health services if needed. CSW informed case Freight forwarder.  CSW will continue to follow.    Expected Discharge Plan: [place] Barriers to Discharge: SNF Pending bed offer,Continued Medical Work up  Expected Discharge Plan and Services Expected Discharge Plan: [place] arrangements for the past 2 months: Single Family Home                                       Social Determinants of Health (SDOH) Interventions    Readmission Risk Interventions No flowsheet data found.

## 2021-03-18 NOTE — Plan of Care (Signed)

## 2021-03-19 DIAGNOSIS — I824Y2 Acute embolism and thrombosis of unspecified deep veins of left proximal lower extremity: Secondary | ICD-10-CM | POA: Diagnosis not present

## 2021-03-19 DIAGNOSIS — N179 Acute kidney failure, unspecified: Secondary | ICD-10-CM | POA: Diagnosis not present

## 2021-03-19 DIAGNOSIS — E871 Hypo-osmolality and hyponatremia: Secondary | ICD-10-CM | POA: Diagnosis not present

## 2021-03-19 MED ORDER — SENNOSIDES-DOCUSATE SODIUM 8.6-50 MG PO TABS
1.0000 | ORAL_TABLET | Freq: Two times a day (BID) | ORAL | Status: DC
  Administered 2021-03-19: 1 via ORAL
  Filled 2021-03-19 (×3): qty 1

## 2021-03-19 MED ORDER — POLYETHYLENE GLYCOL 3350 17 G PO PACK: 17.0000 g | PACK | Freq: Every day | ORAL | Status: DC | PRN

## 2021-03-19 NOTE — Progress Notes (Addendum)
Wendy [name] Asa  HRC:[id number] DOB: 11-19-1946 DOA: 03/13/2021 PCP: Abner Greenspan, MD    Brief Narrative:  75EH with a hx of amyloidosis, HTN, HLD, and hyponatremia who was admitted after she was found to have bilateral lower extremity DVTs extending all the way into the inferior vena cava.   Antimicrobials:  Vancomycin 3/23  DVT prophylaxis: IV heparin  Consultants:  none  Subjective: Resting comfortably in bed.  No new complaints.  Denies shortness of breath or chest pain.  Feels swelling in her legs is "about the same."  The patient clarified to me that she is not refusing a SNF rehab stay, but is interested in possibly being discharged home temporarily if the wait for a SNF bed is going to be more than another day or 2, with the intention then being to transfer into a rehab facility from her home.  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 S/p mechanical thrombectomy of IVC, bilateral common iliac veins, bilateral external iliac veins, and bilateral common femoral veins to include removal of a foreign body from the IVC 3/23 - ambulate w/ PT/OT - transitioned to Eliquis -follow-up with vascular surgery as an outpatient -discussed possibility that lower extremity edema will persist but also explained that it may improve with time  Mild sinus tachycardia May simply be due to to deconditioning but certainly the patient is at high risk for pulmonary embolism -she is hemodynamically stable and therefore I do not think there is anything to be gained by obtaining CT angio chest given that she is going to need prolonged anticoagulation regardless -there are no clinical signs to suggest right heart failure and therefore I feel repeating a TTE (patient have 12/11/21) is not likely to benefit the patient -of note heart rate 100 during office visit 3/17 and 108 02/23/21 -low-dose beta-blocker initiated and patient is tolerating this without difficulty with improved  heart rate  Hyponatremia -chronic Sodium is stable  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 Louanne Skye and has had a vertebroplasty - migrated cement found partially occluding IVC at time of vascular procedure   HLD Continue Crestor  Amyloidosis Followed by hematology and nephrology with patient actively receiving chemotherapy at this time  Acute kidney injury Baseline creatinine 0.7 -creatinine 1.2 at admission - slowly improved w/ volume expansion   Anemia due to chronic disease Baseline hemoglobin 9.5-10 - continue to monitor -no gross blood loss evident at present - s/p transfusion of 2U PRBC this admit   Dispostion  Medically stable for d/c - awaiting SNF rehab bed availability    Code Status: FULL CODE Family Communication: Lengthy discussion with patient and daughter guarding ongoing care and future plans Status is: Inpatient  Remains inpatient appropriate because:Inpatient level of care appropriate due to severity of illness   Dispo: The patient is from: Home              Anticipated d/c is to: SNF              Patient currently is medically stable to d/c.   Difficult to place patient No    Objective: Blood pressure (!) 150/79, pulse 97, temperature 98.6 F (37 C), temperature source Oral, resp. rate 18, height 5\' 2"  (1.575 m), weight 60.8 kg, SpO2 94 %. No intake or output data in the 24 hours ending 03/19/21 1451 Filed Weights   03/13/21 1236 03/15/21 1428  Weight: 60.8 kg 60.8 kg    Examination:  General: No acute respiratory distress Lungs: Clear to auscultation B without wheezing Cardiovascular: RRR without murmur Abdomen: Nondistended, soft, BS positive, no rebound Extremities: 2+ stable edema B LE - no erythema  CBC: Recent Labs  Lab 03/13/21 1317 03/14/21 0021 03/16/21 0845 03/17/21 0226 03/18/21 0254  WBC 9.4   < > 5.3 5.3 5.8  NEUTROABS 8.6*  --   --   --   --   HGB 9.8*   < > 10.8* 9.0* 9.5*  HCT 27.7*   < > 31.2*  26.0* 27.4*  MCV 96.5   < > 92.9 93.5 94.5  PLT 257   < > 268 196 211   < > = values in this interval not displayed.   Basic Metabolic Panel: Recent Labs  Lab 03/15/21 0341 03/16/21 0845 03/17/21 0226  NA 124* 129* 125*  K 3.8 4.0 4.0  CL 92* 95* 95*  CO2 25 25 23   GLUCOSE 104* 137* 97  BUN 17 15 14   CREATININE 1.14* 1.14* 1.04*  CALCIUM 8.1* 8.2* 8.2*   GFR: Estimated Creatinine Clearance: 39.5 mL/min (A) (by C-G formula based on SCr of 1.04 mg/dL (H)).  Liver Function Tests: Recent Labs  Lab 03/13/21 1317  AST 25  ALT 19  ALKPHOS 86  BILITOT 1.2  PROT 5.6*  ALBUMIN 2.8*    Coagulation Profile: Recent Labs  Lab 03/13/21 1432  INR 1.0     HbA1C: Hgb A1c MFr Bld  Date/Time Value Ref Range Status  10/13/2020 08:00 AM 5.6 4.6 - 6.5 % Final    Comment:    Glycemic Control Guidelines for People with Diabetes:Non Diabetic:  <6%Goal of Therapy: <7%Additional Action Suggested:  >8%   10/12/2019 08:12 AM 5.5 4.6 - 6.5 % Final    Comment:    Glycemic Control Guidelines for People with Diabetes:Non Diabetic:  <6%Goal of Therapy: <7%Additional Action Suggested:  >8%     CBG: Recent Labs  Lab 03/13/21 1430  GLUCAP 104*    Recent Results (from the past 240 hour(s))  Resp Panel by RT-PCR (Flu A&B, Covid) Nasopharyngeal Swab     Status: None   Collection Time: 03/13/21  4:35 PM   Specimen: Nasopharyngeal Swab; Nasopharyngeal(NP) swabs in vial transport medium  Result Value Ref Range Status   SARS Coronavirus 2 by RT PCR NEGATIVE NEGATIVE Final    Comment: (NOTE) SARS-CoV-2 target nucleic acids are NOT DETECTED.  The SARS-CoV-2 RNA is generally detectable in upper respiratory specimens during the acute phase of infection. The lowest concentration of SARS-CoV-2 viral copies this assay can detect is 138 copies/mL. A negative result does not preclude SARS-Cov-2 infection and should not be used as the sole basis for treatment or other patient management  decisions. A negative result may occur with  improper specimen collection/handling, submission of specimen other than nasopharyngeal swab, presence of viral mutation(s) within the areas targeted by this assay, and inadequate number of viral copies(<138 copies/mL). A negative result must be combined with clinical observations, patient history, and epidemiological information. The expected result is Negative.  Fact Sheet for Patients:  EntrepreneurPulse.com.au  Fact Sheet for Healthcare Providers:  IncredibleEmployment.be  This test is no t yet approved or cleared by the [place] FDA and  has been authorized for detection and/or diagnosis of SARS-CoV-2 by FDA under an Emergency Use Authorization (EUA). This EUA will remain  in effect (meaning this test can be used) for the duration of the COVID-19 declaration under Section 564(b)(1) of the Act, 21 U.S.C.section  360bbb-3(b)(1), unless the authorization is terminated  or revoked sooner.       Influenza A by PCR NEGATIVE NEGATIVE Final   Influenza B by PCR NEGATIVE NEGATIVE Final    Comment: (NOTE) The Xpert Xpress SARS-CoV-2/FLU/RSV plus assay is intended as an aid in the diagnosis of influenza from Nasopharyngeal swab specimens and should not be used as a sole basis for treatment. Nasal washings and aspirates are unacceptable for Xpert Xpress SARS-CoV-2/FLU/RSV testing.  Fact Sheet for Patients: EntrepreneurPulse.com.au  Fact Sheet for Healthcare Providers: IncredibleEmployment.be  This test is not yet approved or cleared by the [place] FDA and has been authorized for detection and/or diagnosis of SARS-CoV-2 by FDA under an Emergency Use Authorization (EUA). This EUA will remain in effect (meaning this test can be used) for the duration of the COVID-19 declaration under Section 564(b)(1) of the Act, 21 U.S.C. section 360bbb-3(b)(1), unless the  authorization is terminated or revoked.  Performed at [facility], [place] [address] [place], [place] [address]   Surgical pcr screen     Status: None   Collection Time: 03/13/21  9:54 PM   Specimen: Nasal Mucosa; Nasal Swab  Result Value Ref Range Status   MRSA, PCR NEGATIVE NEGATIVE Final   Staphylococcus aureus NEGATIVE NEGATIVE Final    Comment: (NOTE) The Xpert SA Assay (FDA approved for NASAL specimens in patients 69 years of age and older), is one component of a comprehensive surveillance program. It is not intended to diagnose infection nor to guide or monitor treatment. Performed at [facility], [place] [address] [place],  [address]      Scheduled Meds: . acyclovir  400 mg Oral BID  . apixaban  10 mg Oral BID   Followed by  . [START ON 03/21/2021] apixaban  5 mg Oral BID  . cholecalciferol  2,000 Units Oral Daily  . melatonin  10 mg Oral QHS  . metoprolol tartrate  12.5 mg Oral BID  . rosuvastatin  10 mg Oral Daily  . zolpidem  5 mg Oral QHS      LOS: 6 days   Cherene Altes, MD [facility] Office  [phone] Pager - Text Page per Amion  If 7PM-7AM, please contact night-coverage per Amion 03/19/2021, 2:51 PM

## 2021-03-19 NOTE — Plan of Care (Signed)
  Problem: Activity: Goal: Risk for activity intolerance will decrease Outcome: Progressing   Problem: Coping: Goal: Level of anxiety will decrease Outcome: Progressing   Problem: Pain Managment: Goal: General experience of comfort will improve Outcome: Progressing   Problem: Safety: Goal: Ability to remain free from injury will improve Outcome: Progressing   Problem: Skin Integrity: Goal: Risk for impaired skin integrity will decrease Outcome: Progressing   

## 2021-03-19 NOTE — Discharge Summary (Addendum)
DISCHARGE SUMMARY  Torianna L. Schwimmer  MR#: [id number]  DOB:[birth year]  Date of Admission: 03/13/2021 Date of Discharge: 03/20/2021  Attending Physician:Jameire Kouba Hennie Duos, MD  Patient's CMK:[id number], Wynelle Fanny, MD  Consults: Vascular Surgery     Disposition: D/C home    Follow-up Appts:  Follow-up Information    [facility] Follow up.   Specialty: [place] Why: the office will call to schedule home health visits typically within 48 hours of discharge Contact information: [address] [place] [address] [phone]        Abner Greenspan, MD Follow up in 7 day(s).   Specialties: Family Medicine, Radiology Contact information: [address] [place] [address] [phone]        Waynetta Sandy, MD Follow up.   Specialties: Vascular Surgery, Cardiology Why: The office will contact you to arrange for a follow up appointment.  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 S/p mechanical thrombectomy of IVC, bilateral common iliac veins, bilateral external iliac veins, and bilateral common femoral veins to include removal of a foreign body from the IVC 3/23 - ambulated w/ PT/OT -transitioned to Eliquis - improved to point of d/c home w/ PT/OT while was  awaiting SNF rehab placement   Mild sinus tachycardia May simply be due to to deconditioning but certainly the patient is at high risk for pulmonary embolism -she is hemodynamically stable and therefore I do not think there is anything to be gained by obtaining CT angiogram chest given that she is going to need prolonged anticoagulation regardless -there are no clinical signs to suggest right heart failure and therefore I feel repeating a TTE (patient have 12/11/21) is not likely to benefit the patient -of note heart rate 100 during office visit 3/17 and 108 02/23/21  Hyponatremia -chronic Sodium stable  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 Louanne Skye and has had a vertebroplasty - migrated cement found partially occluding IVC at time of vascular procedure   HLD Continue Crestor  Amyloidosis Followed by hematology and nephrology with patient actively receiving chemotherapy at this time  Acute kidney injury Baseline creatinine 0.7 -creatinine 1.2 at admission - slowly improved w/ volume expansion   Anemia due to chronic disease Baseline hemoglobin 9.5-10 - no gross blood loss evident at time of d/c - s/p transfusion of 2U PRBC this admit    Allergies as of 03/20/2021      Reactions   Bentyl [dicyclomine Hcl]    Groggy, blurred vision   Butalbital-aspirin-caffeine Other (See Comments)   hallucinations   Clonidine Derivatives    dizziness, lightheadedness, abdominal cramping, dry mouth/throat   Linzess [linaclotide] Diarrhea   Morphine And Related Other (See Comments)   Does not work   Motrin [ibuprofen]    GI upset   Penicillins Other (  See Comments)   As child; reaction unknown Has patient had a PCN reaction causing immediate rash, facial/tongue/throat swelling, SOB or lightheadedness with hypotension: No Has patient had a PCN reaction causing severe rash involving mucus membranes or skin necrosis: No Has patient had a PCN reaction that required  hospitalization: No Has patient had a PCN reaction occurring within the last 10 years: No If all of the above answers are "NO", then may proceed with Cephalosporin use.   Sulfa Antibiotics    In childhood   Zanaflex [tizanidine Hcl] Other (See Comments)   Decreased BP   Diphenhydramine Hcl Palpitations   restlessness      Medication List    STOP taking these medications   losartan 100 MG tablet Commonly known as: COZAAR     TAKE these medications   acetaminophen 325 MG tablet Commonly known as: TYLENOL Take 2 tablets (650 mg total) by mouth every 6 (six) hours as needed for mild pain, fever or headache.   acyclovir 400 MG tablet Commonly known as: ZOVIRAX Take 1 tablet (400 mg total) by mouth 2 (two) times daily.   albuterol 108 (90 Base) MCG/ACT inhaler Commonly known as: VENTOLIN HFA Inhale 2 puffs into the lungs every 6 (six) hours as needed for wheezing or shortness of breath.   alclomethasone 0.05 % ointment Commonly known as: ACLOVATE APPLY TOPICALLY TO LIPS DAILY AS NEEDED What changed: See the new instructions.   ALPRAZolam 0.5 MG tablet Commonly known as: Xanax Take 1 tablet (0.5 mg total) by mouth 2 (two) times daily as needed for anxiety. Caution of sedation What changed:   how much to take  when to take this  reasons to take this  additional instructions   apixaban 5 MG Tabs tablet Commonly known as: ELIQUIS Take 10mg  (2 tablets) by mouth twice daily until 3/28; then on 3/29, decrease to 5mg  (1 tablet) by mouth twice daily   apixaban 5 MG Tabs tablet Commonly known as: ELIQUIS Take 1 tablet (5 mg total) by mouth 2 (two) times daily. Start taking on: April 14, 2021   bumetanide 0.5 MG tablet Commonly known as: BUMEX Take 0.5 mg by mouth daily. Takes an additional tablet if needed   denosumab 60 MG/ML Sosy injection Commonly known as: PROLIA Inject 60 mg into the skin every 6 (six) months.   esomeprazole 40 MG capsule Commonly known as:  NEXIUM Take 1 capsule (40 mg total) by mouth daily.   Melatonin 10 MG Tabs Take 10 mg by mouth at bedtime.   metoprolol tartrate 25 MG tablet Commonly known as: LOPRESSOR Take 0.5 tablets (12.5 mg total) by mouth 2 (two) times daily.   prochlorperazine 10 MG tablet Commonly known as: COMPAZINE Take 1 tablet (10 mg total) by mouth every 6 (six) hours as needed for nausea or vomiting.   rosuvastatin 10 MG tablet Commonly known as: CRESTOR Take 1 tablet (10 mg total) by mouth daily.   SYSTANE BALANCE OP Place 1 drop into both eyes daily at 6 (six) AM.   traMADol 50 MG tablet Commonly known as: ULTRAM TAKE 1 TO 2 TABLETS(50 TO 100 MG) BY MOUTH EVERY 6 HOURS AS NEEDED What changed: See the new instructions.   TUMS PO Take 2-4 capsules by mouth daily as needed (heartburn).   Vitamin D 50 MCG (2000 UT) tablet Take 2,000 Units by mouth daily.   zolpidem 10 MG tablet Commonly known as: AMBIEN TAKE ONE TABLET BY MOUTH AT BEDTIME AS NEEDED FOR SLEEP What  changed:   when to take this  additional instructions            Durable Medical Equipment  (From admission, onward)         Start     Ordered   03/20/21 1037  For home use only DME 3 n 1  Once        03/20/21 1036   03/19/21 0905  For home use only DME Walker rolling  Once       Question Answer Comment  Walker: With 5 Inch Wheels   Patient needs a walker to treat with the following condition DVT (deep venous thrombosis) ([place])      03/19/21 0905          Day of Discharge BP (!) 155/80 (BP Location: Right Arm)   Pulse (!) 101   Temp 98.3 F (36.8 C) (Oral)   Resp 17   Ht 5\' 2"  (1.575 m)   Wt 60.8 kg   SpO2 96%   BMI 24.51 kg/m   Physical Exam: General: No acute respiratory distress Lungs: Clear to auscultation bilaterally without wheezes or crackles Cardiovascular: Regular rate and rhythm without murmur gallop or rub normal S1 and S2 Abdomen: Nontender, nondistended, soft, bowel sounds positive, no  rebound, no ascites, no appreciable mass Extremities: 2+ doughy B LE edema   Basic Metabolic Panel: Recent Labs  Lab 03/13/21 2211 03/15/21 0341 03/16/21 0845 03/17/21 0226 03/20/21 0429  NA 123* 124* 129* 125* 124*  K 3.9 3.8 4.0 4.0 4.2  CL 93* 92* 95* 95* 94*  CO2 21* 25 25 23 23   GLUCOSE 131* 104* 137* 97 89  BUN 21 17 15 14 10   CREATININE 1.15* 1.14* 1.14* 1.04* 0.94  CALCIUM 8.0* 8.1* 8.2* 8.2* 8.1*    CBC: Recent Labs  Lab 03/15/21 0341 03/16/21 0845 03/17/21 0226 03/18/21 0254 03/20/21 0429  WBC 4.5 5.3 5.3 5.8 7.0  HGB 8.0* 10.8* 9.0* 9.5* 10.3*  HCT 22.6* 31.2* 26.0* 27.4* 29.5*  MCV 96.2 92.9 93.5 94.5 95.8  PLT 316 268 196 211 241    BNP (last 3 results) Recent Labs    02/10/21 1516 02/11/21 0236 02/13/21 1304  BNP 703.9* 991.9* 314.3*     Recent Results (from the past 240 hour(s))  Resp Panel by RT-PCR (Flu A&B, Covid) Nasopharyngeal Swab     Status: None   Collection Time: 03/13/21  4:35 PM   Specimen: Nasopharyngeal Swab; Nasopharyngeal(NP) swabs in vial transport medium  Result Value Ref Range Status   SARS Coronavirus 2 by RT PCR NEGATIVE NEGATIVE Final    Comment: (NOTE) SARS-CoV-2 target nucleic acids are NOT DETECTED.  The SARS-CoV-2 RNA is generally detectable in upper respiratory specimens during the acute phase of infection. The lowest concentration of SARS-CoV-2 viral copies this assay can detect is 138 copies/mL. A negative result does not preclude SARS-Cov-2 infection and should not be used as the sole basis for treatment or other patient management decisions. A negative result may occur with  improper specimen collection/handling, submission of specimen other than nasopharyngeal swab, presence of viral mutation(s) within the areas targeted by this assay, and inadequate number of viral copies(<138 copies/mL). A negative result must be combined with clinical observations, patient history, and epidemiological information. The  expected result is Negative.  Fact Sheet for Patients:  EntrepreneurPulse.com.au  Fact Sheet for Healthcare Providers:  IncredibleEmployment.be  This test is no t yet approved or cleared by the [place] and  has been authorized  for detection and/or diagnosis of SARS-CoV-2 by FDA under an Emergency Use Authorization (EUA). This EUA will remain  in effect (meaning this test can be used) for the duration of the COVID-19 declaration under Section 564(b)(1) of the Act, 21 U.S.C.section 360bbb-3(b)(1), unless the authorization is terminated  or revoked sooner.       Influenza A by PCR NEGATIVE NEGATIVE Final   Influenza B by PCR NEGATIVE NEGATIVE Final    Comment: (NOTE) The Xpert Xpress SARS-CoV-2/FLU/RSV plus assay is intended as an aid in the diagnosis of influenza from Nasopharyngeal swab specimens and should not be used as a sole basis for treatment. Nasal washings and aspirates are unacceptable for Xpert Xpress SARS-CoV-2/FLU/RSV testing.  Fact Sheet for Patients: EntrepreneurPulse.com.au  Fact Sheet for Healthcare Providers: IncredibleEmployment.be  This test is not yet approved or cleared by the [place] FDA and has been authorized for detection and/or diagnosis of SARS-CoV-2 by FDA under an Emergency Use Authorization (EUA). This EUA will remain in effect (meaning this test can be used) for the duration of the COVID-19 declaration under Section 564(b)(1) of the Act, 21 U.S.C. section 360bbb-3(b)(1), unless the authorization is terminated or revoked.  Performed at [facility], [place] [address], [place], [place] [address]   Surgical pcr screen     Status: None   Collection Time: 03/13/21  9:54 PM   Specimen: Nasal Mucosa; Nasal Swab  Result Value Ref Range Status   MRSA, PCR NEGATIVE NEGATIVE Final   Staphylococcus aureus NEGATIVE NEGATIVE Final    Comment:  (NOTE) The Xpert SA Assay (FDA approved for NASAL specimens in patients 23 years of age and older), is one component of a comprehensive surveillance program. It is not intended to diagnose infection nor to guide or monitor treatment. Performed at [facility], [place] [address], [place], [place] [address]       Time spent in discharge (includes decision making & examination of pt): 35 minutes  03/20/2021, 4:16 PM   Cherene Altes, MD [facility] Office  [phone]

## 2021-03-20 ENCOUNTER — Encounter (HOSPITAL_COMMUNITY): Payer: Self-pay | Admitting: Internal Medicine

## 2021-03-20 ENCOUNTER — Other Ambulatory Visit: Payer: Self-pay | Admitting: Internal Medicine

## 2021-03-20 DIAGNOSIS — I824Y2 Acute embolism and thrombosis of unspecified deep veins of left proximal lower extremity: Secondary | ICD-10-CM | POA: Diagnosis not present

## 2021-03-20 DIAGNOSIS — E871 Hypo-osmolality and hyponatremia: Secondary | ICD-10-CM | POA: Diagnosis not present

## 2021-03-20 DIAGNOSIS — M795 Residual foreign body in soft tissue: Secondary | ICD-10-CM | POA: Diagnosis not present

## 2021-03-20 DIAGNOSIS — N179 Acute kidney failure, unspecified: Secondary | ICD-10-CM | POA: Diagnosis not present

## 2021-03-20 LAB — CBC
HCT: 29.5 % — ABNORMAL LOW (ref 36.0–46.0)
Hemoglobin: 10.3 g/dL — ABNORMAL LOW (ref 12.0–15.0)
MCH: 33.4 pg (ref 26.0–34.0)
MCHC: 34.9 g/dL (ref 30.0–36.0)
MCV: 95.8 fL (ref 80.0–100.0)
Platelets: 241 10*3/uL (ref 150–400)
RBC: 3.08 MIL/uL — ABNORMAL LOW (ref 3.87–5.11)
RDW: 14.4 % (ref 11.5–15.5)
WBC: 7 10*3/uL (ref 4.0–10.5)
nRBC: 0 % (ref 0.0–0.2)

## 2021-03-20 LAB — BASIC METABOLIC PANEL
Anion gap: 7 (ref 5–15)
BUN: 10 mg/dL (ref 8–23)
CO2: 23 mmol/L (ref 22–32)
Calcium: 8.1 mg/dL — ABNORMAL LOW (ref 8.9–10.3)
Chloride: 94 mmol/L — ABNORMAL LOW (ref 98–111)
Creatinine, Ser: 0.94 mg/dL (ref 0.44–1.00)
GFR, Estimated: >60 mL/min (ref >60)
Glucose, Bld: 89 mg/dL (ref 70–99)
Potassium: 4.2 mmol/L (ref 3.5–5.1)
Sodium: 124 mmol/L — ABNORMAL LOW (ref 135–145)

## 2021-03-20 MED ORDER — APIXABAN 5 MG PO TABS: ORAL_TABLET | ORAL | 0 refills | Status: DC

## 2021-03-20 MED ORDER — APIXABAN 5 MG PO TABS: 5.0000 mg | ORAL_TABLET | Freq: Two times a day (BID) | ORAL | 1 refills | Status: DC

## 2021-03-20 MED ORDER — ACETAMINOPHEN 325 MG PO TABS: 650.0000 mg | ORAL_TABLET | Freq: Four times a day (QID) | ORAL | Status: AC | PRN

## 2021-03-20 MED ORDER — METOPROLOL TARTRATE 25 MG PO TABS: 12.5000 mg | ORAL_TABLET | Freq: Two times a day (BID) | ORAL | 1 refills | Status: DC

## 2021-03-20 MED FILL — ELIQUIS 5 MG TABLET: 5 | 30 days supply | Qty: 67 | Fill #0

## 2021-03-20 MED FILL — METOPROLOL TARTRATE 25 MG T: 25 | 30 days supply | Qty: 30 | Fill #0

## 2021-03-20 NOTE — Plan of Care (Signed)
  Problem: Health Behavior/Discharge Planning: Goal: Ability to manage health-related needs will improve Outcome: Progressing   Problem: Clinical Measurements: Goal: Diagnostic test results will improve Outcome: Progressing   Problem: Activity: Goal: Risk for activity intolerance will decrease Outcome: Progressing   

## 2021-03-20 NOTE — Progress Notes (Addendum)
Physical Therapy Treatment Patient Details Name: Wendy Porcaro. [name] MRN: [id number] DOB: 18-Dec-1946 Today's Date: 03/20/2021    History of Present Illness 75 y.o. female admitted 3/21 for weakness, leg swelling and mechanical fall. 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  PMH: amyloidosis, HTN, HLD, old T10 compression fracture    PT Comments    Pt supine in bed this session.  Pt performed gt and stair training this session.  Tolerated mobility well but does show fatigue.  Performed HEP and issued handout for home use.  Will update recommendations to HHPT at this time.  Will inform supervising PT of need for change in recommendations at this time based on progress.       Follow Up Recommendations  Home health PT     Equipment Recommendations  Rolling walker with 5" wheels    Recommendations for Other Services       Precautions / Restrictions Precautions Precautions: Fall Precaution Comments: monitor skin on BLE's and edema Restrictions Weight Bearing Restrictions: No    Mobility  Bed Mobility Overal bed mobility: Needs Assistance Bed Mobility: Rolling;Sidelying to Sit Rolling: Supervision Sidelying to sit: Supervision       General bed mobility comments: reports back pain so performed rolling and sidelying to sit to ease back pain.    Transfers Overall transfer level: Needs assistance Equipment used: Rolling walker (2 wheeled) Transfers: Sit to/from Stand Sit to Stand: Supervision         General transfer comment: cues for hand placement as sitting edge of bed reaching to pull on RW to rise into standing.  However no physical assistance is needed.  Ambulation/Gait Ambulation/Gait assistance: Supervision;Min guard Gait Distance (Feet): 200 Feet Assistive device: Rolling walker (2 wheeled) Gait  Pattern/deviations: Step-through pattern;Wide base of support;Trunk flexed Gait velocity: reduced   General Gait Details: Cues for RW position and to maintain close proximity to RW.  At time with turns required cues to slow manamagent of RW and to keep device close to her body.   Stairs Stairs: Yes Stairs assistance: Min guard Stair Management: One rail Right Number of Stairs: 6 General stair comments: Cues for sequencing and hand placement.   Wheelchair Mobility    Modified Rankin (Stroke Patients Only)       Balance Overall balance assessment: Needs assistance Sitting-balance support: Feet supported;No upper extremity supported Sitting balance-Leahy Scale: Good     Standing balance support: No upper extremity supported;During functional activity Standing balance-Leahy Scale: Good Standing balance comment: completing pericare in standing with no UE support                            Cognition Arousal/Alertness: Awake/alert Behavior During Therapy: WFL for tasks assessed/performed Overall Cognitive Status: Within Functional Limits for tasks assessed                                 General Comments: good safety awareness, safe use of RW      Exercises General Exercises - Lower Extremity Ankle Circles/Pumps: AROM;Both;10 reps;Supine Quad Sets: AROM;Both;10 reps;Supine Long Arc Quad: AROM;Both;10 reps;Supine Heel Slides: AROM;Both;10 reps;Supine;AAROM Hip ABduction/ADduction: AROM;Both;10 reps;Supine Straight Leg Raises: AROM;Both;10 reps;Supine Hip Flexion/Marching: AROM;Both;10 reps;Seated    General Comments General comments (  skin integrity, edema, etc.): pts daughter and son-in-law present during session      Pertinent Vitals/Pain Pain Assessment: 0-10 Pain Score: 5  Faces Pain Scale: Hurts a little bit Pain Location: back Pain Descriptors / Indicators: Discomfort Pain Intervention(s): Monitored during session;Repositioned     Home Living                      Prior Function            PT Goals (current goals can now be found in the care plan section) Acute Rehab PT Goals Patient Stated Goal: to go back home Potential to Achieve Goals: Good Progress towards PT goals: Progressing toward goals    Frequency    Min 5X/week      PT Plan Discharge plan needs to be updated;Frequency needs to be updated    Co-evaluation              AM-PAC PT "6 Clicks" Mobility   Outcome Measure  Help needed turning from your back to your side while in a flat bed without using bedrails?: None Help needed moving from lying on your back to sitting on the side of a flat bed without using bedrails?: None Help needed moving to and from a bed to a chair (including a wheelchair)?: A Little Help needed standing up from a chair using your arms (e.g., wheelchair or bedside chair)?: A Little Help needed to walk in hospital room?: A Little Help needed climbing 3-5 steps with a railing? : Total 6 Click Score: 18    End of Session Equipment Utilized During Treatment: Gait belt Activity Tolerance: Patient tolerated treatment well Patient left: with call bell/phone within reach;in chair Nurse Communication: Mobility status PT Visit Diagnosis: Unsteadiness on feet (R26.81);Muscle weakness (generalized) (M62.81)     Time: 1610-9604 PT Time Calculation (min) (ACUTE ONLY): 28 min  Charges:  $Gait Training: 8-22 mins $Therapeutic Exercise: 8-22 mins                     Erasmo Leventhal , PTA Acute Rehabilitation Services Pager [phone] Office [phone]     Wendy [name] 03/20/2021, 11:36 AM

## 2021-03-20 NOTE — TOC Progression Note (Addendum)
Transition of Care (TOC) - Progression Note    Patient Details  Name: Wendy [name]. Novell MRN: [id number] Date of Birth: 11-Jan-1946  Transition of Care [facility]) CM/SW Contact  Wile, Edson Snowball, RN Phone Number: 03/20/2021, 10:37 AM  Clinical Narrative:     Prior NCM ordered walker ( delivered to room 03/19/21) and arranged home health services with [facility]. NCM confirmed with [facility].   NCM ordered 3 in 1 this am called Freda Munro with [facility] will be brought to room this am   Daughter Amy aware of call of above. Alvis Lemmings has Amy's contact information also.  Expected Discharge Plan: [place] Barriers to Discharge: Continued Medical Work up  Expected Discharge Plan and Services Expected Discharge Plan: [place] arrangements for the past 2 months: Single Family Home Expected Discharge Date: 03/19/21                                     Social Determinants of Health (SDOH) Interventions    Readmission Risk Interventions No flowsheet data found.

## 2021-03-20 NOTE — Plan of Care (Signed)
  Problem: Activity: Goal: Risk for activity intolerance will decrease Outcome: Progressing   Problem: Coping: Goal: Level of anxiety will decrease Outcome: Progressing   Problem: Pain Managment: Goal: General experience of comfort will improve Outcome: Progressing   Problem: Safety: Goal: Ability to remain free from injury will improve Outcome: Progressing   Problem: Skin Integrity: Goal: Risk for impaired skin integrity will decrease Outcome: Progressing   

## 2021-03-20 NOTE — Consult Note (Signed)
   [facility] CM Inpatient Consult   03/20/2021  Wendy [name] 1946-08-23 [id number]  [facility] [[facility]] Patient: Medicare DCE  Patient was screened for [facility] services. Patient will have the transition of care call conducted by the primary care provider. This patient is also in an Embedded practice which has a chronic disease management Embedded Care Management team. Assessed for unplanned readmission and high risk score fr unplanned readmission. Initial recommendation was for SNF and patient progressed with PT to home with home health.   Plan: Patient is for home with [facility].  Please contact for further questions,  Natividad Brood, RN BSN [facility] Liaison  [phone] business mobile phone Toll free office [phone]  Fax number: [phone] [email or web address]

## 2021-03-20 NOTE — Progress Notes (Signed)
Occupational Therapy Treatment Patient Details Name: Wendy [name]. Menta MRN: [id number] DOB: 1946-05-23 Today's Date: 03/20/2021    History of present illness 75 y.o. female admitted 3/21 for weakness, leg swelling and mechanical fall. 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  PMH: amyloidosis, HTN, HLD, old T10 compression fracture   OT comments  Pt making steady progress towards OT goals this session. Pt received on toilet needing set- up assist for anterior/posterior pericare in standing. Pt additionally completing standing grooming tasks with supervision and functional mobility with RW and supervision for safety. Pts daughter and son-in-law present during session, discussed pt [place] home. Education provided on decreasing falls at home, energy conservation strategies for home, level of assist pt currently needs and safe set- up of 3n1 at home. Feel pt would benefit from [facility] to assess home environment, offer suggestions on safe home set- up and provide  further education on energy conservation strategies to be implemented at home. Pt and family agreeable to DC home with below f/u recs and DME recs, will continue to follow acutely per POC.    Follow Up Recommendations  Home health OT;Supervision - Intermittent (initial 24 hour assist)    Equipment Recommendations  3 in 1 bedside commode    Recommendations for Other Services      Precautions / Restrictions Precautions Precautions: Fall Restrictions Weight Bearing Restrictions: No       Mobility Bed Mobility Overal bed mobility: Modified Independent             General bed mobility comments: sit>supine; no physical assist neededm HOB elevated    Transfers Overall transfer level: Needs assistance Equipment used: Rolling walker (2 wheeled) Transfers: Sit to/from  Stand Sit to Stand: Modified independent (Device/Increase time)         General transfer comment: pt sit<>stand multiple times from toilet for pericare    Balance Overall balance assessment: Needs assistance Sitting-balance support: Feet supported;No upper extremity supported Sitting balance-Leahy Scale: Good     Standing balance support: No upper extremity supported;During functional activity Standing balance-Leahy Scale: Good Standing balance comment: completing pericare in standing with no UE support                           ADL either performed or assessed with clinical judgement   ADL Overall ADL's : Needs assistance/impaired     Grooming: Wash/dry hands;Standing;Supervision/safety Grooming Details (indicate cue type and reason): standing at sink with RW     Lower Body Bathing: Supervison/ safety;Set up;Sit to/from stand Lower Body Bathing Details (indicate cue type and reason): standing with no UE support for LB bathing, set- up assist for wash cloths         Toilet Transfer: Supervision/safety;RW;Ambulation Toilet Transfer Details (indicate cue type and reason): ambulating back from toilet Toileting- Clothing Manipulation and Hygiene: Supervision/safety;Sit to/from stand;Set up [place] Manipulation Details (indicate cue type and reason): pt completing anterior/ posterior pericare via sit<>stand from toilet   Tub/Shower Transfer Details (indicate cue type and reason): pt plans to sink bathe at home   General ADL Comments: pt recieved on toilet, supervision- set- up assist for pericare and standing grooming tasks. supervision for household distance functional mobility with RW. family present during session, fam ed provided on uses of 3n1, strategies for decreasing  falls at home ( handout provided) and set- up of 3n1 beside couch if needed     Vision       Perception     Praxis      Cognition Arousal/Alertness: Awake/alert Behavior  During Therapy: WFL for tasks assessed/performed Overall Cognitive Status: Within Functional Limits for tasks assessed                                          Exercises     Shoulder Instructions       General Comments pts daughter and son-in-law present during session    Pertinent Vitals/ Pain       Pain Assessment: Faces Faces Pain Scale: Hurts a little bit Pain Location: back Pain Descriptors / Indicators: Discomfort Pain Intervention(s): Monitored during session  Home Living                                          Prior Functioning/Environment              Frequency  Min 2X/week        Progress Toward Goals  OT Goals(current goals can now be found in the care plan section)  Progress towards OT goals: Progressing toward goals  Acute Rehab OT Goals Patient Stated Goal: to go back home OT Goal Formulation: With patient Time For Goal Achievement: 03/31/21 Potential to Achieve Goals: Good  Plan Discharge plan needs to be updated;Equipment recommendations need to be updated    Co-evaluation                 AM-PAC OT "6 Clicks" Daily Activity     Outcome Measure   Help from another person eating meals?: None Help from another person taking care of personal grooming?: None Help from another person toileting, which includes using toliet, bedpan, or urinal?: A Little Help from another person bathing (including washing, rinsing, drying)?: A Little Help from another person to put on and taking off regular upper body clothing?: None Help from another person to put on and taking off regular lower body clothing?: None 6 Click Score: 22    End of Session Equipment Utilized During Treatment: Rolling walker  OT Visit Diagnosis: Other abnormalities of gait and mobility (R26.89);Pain;History of falling (Z91.81) Pain - part of body:  (back)   Activity Tolerance Patient tolerated treatment well   Patient Left in bed;with  call bell/phone within reach;with family/visitor present   Nurse Communication Mobility status        Time: 2774-1287 OT Time Calculation (min): 24 min  Charges: OT General Charges $OT Visit: 1 Visit OT Treatments $Self Care/Home Management : 23-37 mins  Harley Alto., COTA/L Acute Rehabilitation Services [phone]    [name] 03/20/2021, 10:41 AM

## 2021-03-20 NOTE — Consult Note (Signed)
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 The scan quality is good. Lumbar spine was not utilized due to exclusion in the past. Site Region Measured Date Measured Age YA BMD Significant CHANGE T-score Left Forearm Radius 33% 02/21/2021 74.5 -4.3 0.506 g/cm2 Left Forearm Radius 33% 11/28/2018 74.2 -4.6 0.475 g/cm2 DualFemur Neck Left 02/21/2021 76.5 -3.3 0.585 g/cm2 * DualFemur Neck Left 11/28/2018 74.2 -3.7 0.518 g/cm2 * DualFemur Total Mean 02/21/2021 76.5 -3.2 0.600 g/cm2 DualFemur Total Mean 11/28/2018 74.2 -3.3 0.587 g/cm2 World Health Organization [facility]) criteria for post-menopausal, Caucasian Women: Normal  T-score at or above -1 SD Osteopenia   T-score between -1 and -2.5 SD Osteoporosis T-score at or below -2.5 SD RECOMMENDATION:  1. All patients should optimize calcium and vitamin D intake. 2. Consider FDA approved medical therapies in postmenopausal women and men aged 80 years and older, based on the following: a. A hip or vertebral (clinical or morphometric) fracture b. T- score < or = -2.5 at the femoral neck or spine after appropriate evaluation to exclude secondary causes c. Low bone mass (T-score between -1.0 and -2.5 at the femoral neck or spine) and a 10 year probability of a hip fracture > or = 3% or a 10 year probability of a major osteoporosis-related fracture > or = 20% based on the [place]-adapted WHO algorithm d. Clinician judgment and/or patient preferences may indicate treatment for people with 10-year fracture probabilities above or below these levels FOLLOW-UP: Patients with diagnosis of osteoporosis or at high risk for fracture should have regular bone mineral density tests. For patients eligible for Medicare, routine testing is allowed once every 2 years. The testing frequency can be increased to one year for patients who have rapidly progressing disease, those who are receiving or discontinuing medical therapy to restore bone mass, or have additional risk factors. I have reviewed this report and agree with the above findings. [facility] Electronically Signed   By: Rolm Baptise M.D.   On: 02/21/2021 13:11   CT VENOGRAM ABD/PEL  Addendum Date: 03/13/2021   ADDENDUM REPORT: 03/13/2021 19:00 ADDENDUM: These results were called by telephone at the time of interpretation on 03/13/2021 at 6:59 pm to provider ABIGAIL HARRIS , who verbally acknowledged these results. Electronically Signed   By: Zetta Bills M.D.   On: 03/13/2021 19:00   Result Date: 03/13/2021 CLINICAL DATA:  75 year old female with reported history of deep venous thrombosis. EXAM: CT ABDOMEN AND PELVIS WITH CONTRAST TECHNIQUE: Multidetector CT imaging of the abdomen and pelvis was performed using the standard protocol following bolus administration  of intravenous contrast. CONTRAST:  163m OMNIPAQUE IOHEXOL 350 MG/ML SOLN COMPARISON:  March 10, 2020 FINDINGS: Lower chest: Stable small pulmonary nodule on image 21 of series 7 approximately 4 mm in the RIGHT lower lobe. Other scattered small nodules with similar appearance in the lung bases. No effusion. No consolidation. Hepatobiliary: No focal, suspicious hepatic lesion. Variable hepatic steatosis. Mild biliary duct distension following cholecystectomy unchanged since previous imaging. Pancreas: Normal, without mass, inflammation or ductal dilatation. Spleen: Spleen normal size and contour. Adrenals/Urinary Tract: Adrenal glands are normal. Symmetric renal enhancement. No hydronephrosis. No suspicious renal lesion. Cyst in the LEFT kidney and smaller cysts throughout the kidneys. Largest on the LEFT posteriorly measuring approximately 1.8 cm. Urinary bladder with smooth contours. Stomach/Bowel: No acute gastrointestinal process involving stomach or small bowel. Appendix not visualized though no secondary signs of acute appendicitis. Colonic diverticulosis of the sigmoid colon. Mild pericolonic and perirectal stranding at the rectosigmoid junction. 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 The fact relate from previous vertebroplasty extending into the IVC lumen is unchanged, a chronic finding. This passes through the thrombus in the lumen. Bilateral iliac thrombus with expansion of the inferior IVC and iliac veins and stranding about the venous systems in the pelvis and generalized congestive changes suspected throughout the pelvis. Thrombus on the LEFT extends into the femoral veins and there is asymmetric lower extremity edema associated with this finding. Calcified and noncalcified atheromatous plaque of the abdominal aorta. No aneurysmal  dilation. No abdominal lymphadenopathy. No pelvic lymphadenopathy Reproductive: Post hysterectomy.  No  adnexal masses. Other: Body wall edema.  No ascites. Musculoskeletal: Spinal degenerative changes. No acute or destructive bone process. Evidence of prior vertebroplasty at T12 through L3 and associated loss of height at these levels with similar appearance to prior imaging. IMPRESSION: 1. Occlusive thrombus expanding in the inferior vena cava below the renal veins extending through the femoral vein on the LEFT and the external iliac vein on the RIGHT. Also involving internal iliac vein on the LEFT. 2. Colonic diverticulosis with mild stranding about the rectosigmoid junction, potentially related to edema in the setting of extensive deep venous thrombosis. Mild diverticulitis is not excluded though not favored. Correlate with symptoms. 3. Variable hepatic steatosis. 4. Stable small pulmonary nodule in the lung bases. 5. Evidence of prior vertebroplasty at T12 through L3 and associated loss of height at these levels with similar appearance to prior imaging. 6. Aortic atherosclerosis. Aortic Atherosclerosis (ICD10-I70.0). Electronically Signed: By: Zetta Bills M.D. On: 03/13/2021 18:55   DG FEMUR PORT MIN 2 VIEWS LEFT  Result Date: 03/16/2021 CLINICAL DATA:  Evaluate for possible foreign body EXAM: LEFT FEMUR PORTABLE 2 VIEWS COMPARISON:  None FINDINGS: Contrast is noted within the bladder from prior CT. No acute fracture or dislocation is noted. Prior patellar fracture is seen with some callus formation identified. Radiopaque material is noted in the medial soft tissues of the thigh distally consistent with the recently retrieved methylmethacrylate fragment from the IVC. This is similar to that seen on prior lumbar spine film in the IVC and corresponds with the given clinical history. IMPRESSION: Methylmethacrylate within the soft tissues of the distal thigh consistent with the recent manipulation of an intravascular foreign body in the IVC. Healing left patellar fracture. Electronically Signed   By: Inez Catalina M.D.   On: 03/16/2021 13:06   VAS [place] LOWER EXTREMITY VENOUS (DVT) (ONLY [facility] & [facility])  Result Date: 03/13/2021  Lower Venous DVT Study Indications: LLE edema / weakness.  Risk Factors: Cancer patient - on chemo. Comparison Study: No previous exams Performing Technologist: Rogelia Rohrer  Examination Guidelines: A complete evaluation includes B-mode imaging, spectral Doppler, color Doppler, and power Doppler as needed of all accessible portions of each vessel. Bilateral testing is considered an integral part of a complete examination. Limited examinations for reoccurring indications may be performed as noted. The reflux portion of the exam is performed with the patient in reverse Trendelenburg.  +-----+---------------+---------+-----------+----------+-----------------------+ RIGHTCompressibilityPhasicitySpontaneityPropertiesThrombus Aging          +-----+---------------+---------+-----------+----------+-----------------------+ CFV  Full           Yes      Yes                                          +-----+---------------+---------+-----------+----------+-----------------------+ EIV  Partial        Yes      Yes                  Proximal EIV non                                                          compressible with no  color. Distal EIV                                                         patent.                 +-----+---------------+---------+-----------+----------+-----------------------+ CIV  None           No       No                   Not well visualized     +-----+---------------+---------+-----------+----------+-----------------------+   +---------+---------------+---------+-----------+----------+-------------------+ LEFT     CompressibilityPhasicitySpontaneityPropertiesThrombus Aging      +---------+---------------+---------+-----------+----------+-------------------+ CFV      None            No       No                   Age Indeterminate   +---------+---------------+---------+-----------+----------+-------------------+ FV Prox  None           No       No                   Acute               +---------+---------------+---------+-----------+----------+-------------------+ FV Mid   None           No       No                   Acute               +---------+---------------+---------+-----------+----------+-------------------+ FV DistalNone           No       No                   Acute               +---------+---------------+---------+-----------+----------+-------------------+ PFV      None           No       No                   Acute               +---------+---------------+---------+-----------+----------+-------------------+ POP      Full           Yes      Yes                  Rouleaux flow       +---------+---------------+---------+-----------+----------+-------------------+ PTV      Full                                                             +---------+---------------+---------+-----------+----------+-------------------+ PERO     Full                                                             +---------+---------------+---------+-----------+----------+-------------------+  GSV      None           No       No                   Acute SVT - origin                                                        to knee             +---------+---------------+---------+-----------+----------+-------------------+ EIV      None                                                             +---------+---------------+---------+-----------+----------+-------------------+ CIV      None                                                             +---------+---------------+---------+-----------+----------+-------------------+     Summary: RIGHT: - No evidence of common femoral vein obstruction. - Findings consistent with acute deep vein  thrombosis involving the CIV and Proximal EIV.  LEFT: - Findings consistent with acute deep vein thrombosis involving the left femoral vein, and left proximal profunda vein. - Findings consistent with acute superficial vein thrombosis involving the left great saphenous vein. - Findings consistent with age indeterminate deep vein thrombosis involving the left common femoral vein, and SF junction. - No cystic structure found in the popliteal fossa. - IVC thrombosed from mid portion to bifurcation.  *See table(s) above for measurements and observations. Electronically signed by Servando Snare MD on 03/13/2021 at 10:18:53 PM.    Final    HYBRID OR IMAGING ([place])  Result Date: 03/15/2021 There is no interpretation for this exam.  This order is for images obtained during a surgical procedure.  Please See "Surgeries" Tab for more information regarding the procedure.   Assessment and Plan:  Wendy [name] 76 y.o. 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 The patient's last visit was on 01/04/2021. 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 After review the labs, review of the records, discussion with the patient the findings most consistent with an AL amyloidosis.  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  It is not clear if there are other organ systems with all this time but she has no findings would be concerning for liver or colon involvement.  We ordered TTE which effectively ruled out cardiac involvement. 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 The patient is currently receiving treatment with Dara-CyBorD.   The patient is now admitted and found to have bilateral DVT with extension into the IVC.  She is status post mechanical thrombectomy and has been placed on Eliquis.  Lower extremity edema has improved overall.   She will  be discharged home later today.  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  This explains the kidney dysfunction and the lambda light chain predominance. --At this time we know that there is renal involvement, however there is not appear to be any cardiac, colon, or liver involvement at this time.  We ordered a TTE which showed normal EF and Grade I diastolic dysfunction.  --Recommend daratumumab, CyBorD per the [place] study.  Cycle 1 Day 1 started on 02/09/2021. --due to respiratory complications on 6/31/4970 will hold Velcade --Given the patient's advanced age she would be considered transplant ineligible --She has a follow-up visit already scheduled on 03/23/2021 and she was advised to keep this appointment.  #Bilateral lower extremity DVT with extension into the IVC --Status post mechanical thrombectomy3/23/2022. --Lower extremity edema improved following procedure. --She has been started on Eliquis and vascular surgery recommends that she be anticoagulated for the duration of her chemotherapy and for 3 to 6 months following completion of chemotherapy.  Mikey Bussing, DNP, AGPCNP-BC, AOCNP

## 2021-03-20 NOTE — Care Management Important Message (Signed)
Important Message  Patient Details  Name: Wendy [name] MRN: [id number] Date of Birth: [birth year]   Medicare Important Message Given:  Yes     Delorse Lek 03/20/2021, 2:30 PM

## 2021-03-20 NOTE — Progress Notes (Signed)
  Progress Note    03/20/2021 9:53 AM 5 Days Post-Op  Subjective: Leg edema much improved, no complaint  Vitals:   03/20/21 0640 03/20/21 0819  BP: 135/88 (!) 155/80  Pulse: 93 (!) 101  Resp: 16 17  Temp: 99 F (37.2 C) 98.3 F (36.8 C)  SpO2: 97% 96%    Physical Exam: Awake alert oriented Unlabored respirations Abdomen is soft Bilateral lower extremities with trace edema if any  CBC    Component Value Date/Time   WBC 7.0 03/20/2021 0429   RBC 3.08 (L) 03/20/2021 0429   HGB 10.3 (L) 03/20/2021 0429   HGB 10.6 (L) 03/09/2021 0800   HCT 29.5 (L) 03/20/2021 0429   PLT 241 03/20/2021 0429   PLT 252 03/09/2021 0800   MCV 95.8 03/20/2021 0429   MCH 33.4 03/20/2021 0429   MCHC 34.9 03/20/2021 0429   RDW 14.4 03/20/2021 0429   LYMPHSABS 0.3 (L) 03/13/2021 1317   MONOABS 0.5 03/13/2021 1317   EOSABS 0.0 03/13/2021 1317   BASOSABS 0.0 03/13/2021 1317    BMET    Component Value Date/Time   NA 124 (L) 03/20/2021 0429   NA 123 (L) 11/03/2020 1103   K 4.2 03/20/2021 0429   CL 94 (L) 03/20/2021 0429   CO2 23 03/20/2021 0429   GLUCOSE 89 03/20/2021 0429   BUN 10 03/20/2021 0429   BUN 10 11/03/2020 1103   CREATININE 0.94 03/20/2021 0429   CREATININE 1.15 (H) 03/09/2021 0800   CALCIUM 8.1 (L) 03/20/2021 0429   GFRNONAA >60 03/20/2021 0429   GFRNONAA 49 (L) 03/09/2021 0800   GFRAA 93 11/03/2020 1103    INR    Component Value Date/Time   INR 1.0 03/13/2021 1432     Intake/Output Summary (Last 24 hours) at 03/20/2021 0953 Last data filed at 03/20/2021 0700 Gross per 24 hour  Intake 240 ml  Output --  Net 240 ml     Assessment:  75 y.o. female is s/p IVC thrombectomy removal of foreign body bilateral lower extremity venous mechanical thrombectomy  Plan: Wearing compression Okay for discharge from vascular standpoint  She will need to be anticoagulated for the duration of her chemotherapy and probably 3 to 6 months after that as well.    Nicodemus Denk C.  Donzetta Matters, MD [facility] Office: [phone] Pager: [phone]  03/20/2021 9:53 AM

## 2021-03-20 NOTE — Progress Notes (Incomplete)
Wendy [name]  JKK:[id number] DOB: 14-Feb-1946 DOA: 03/13/2021 PCP: Abner Greenspan, MD    Brief Narrative:  75IR with a hx of amyloidosis, HTN, HLD, and hyponatremia who was admitted after she was found to have bilateral lower extremity DVTs extending all the way into the inferior vena cava.   Antimicrobials:  Vancomycin 3/23  DVT prophylaxis: elliquis   Consultants:  Vasc Surgery   Subjective: Afebrile.  Vital signs stable.  Saturation 96% on room air  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 S/p mechanical thrombectomy of IVC, bilateral common iliac veins, bilateral external iliac veins, and bilateral common femoral veins to include removal of a foreign body from the IVC 3/23 - ambulate w/ PT/OT - transitioned to Eliquis -follow-up with vascular surgery as an outpatient -discussed possibility that lower extremity edema will persist   Mild sinus tachycardia May simply be due to to deconditioning but certainly the patient is at high risk for pulmonary embolism -she is hemodynamically stable and therefore I do not think there is anything to be gained by obtaining CT angio chest given that she is going to need prolonged anticoagulation regardless -there are no clinical signs to suggest right heart failure and therefore I feel repeating a TTE (patient have 12/11/21) is not likely to benefit the patient -of note heart rate 100 during office visit 3/17 and 108 02/23/21 -low-dose beta-blocker initiated and patient is tolerating this without difficulty with improved heart rate  Hyponatremia -chronic Sodium is stable  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 Louanne Skye and has had a vertebroplasty - migrated cement found partially occluding IVC at time of vascular procedure   HLD Continue Crestor  Amyloidosis Followed by hematology and nephrology with patient actively receiving chemotherapy at this time  Acute kidney injury Baseline  creatinine 0.7 -creatinine 1.2 at admission - slowly improved w/ volume expansion   Anemia due to chronic disease Baseline hemoglobin 9.5-10 - continue to monitor -no gross blood loss evident at present - s/p transfusion of 2U PRBC this admit - Hgb stable   Dispostion  Medically stable for d/c - awaiting SNF rehab bed availability    Code Status: FULL CODE Family Communication: Lengthy discussion with patient and daughter guarding ongoing care and future plans Status is: Inpatient  Remains inpatient appropriate because:Inpatient level of care appropriate due to severity of illness   Dispo: The patient is from: Home              Anticipated d/c is to: SNF              Patient currently is medically stable to d/c.   Difficult to place patient No    Objective: Blood pressure (!) 155/80, pulse (!) 101, temperature 98.3 F (36.8 C), temperature source Oral, resp. rate 17, height 5\' 2"  (1.575 m), weight 60.8 kg, SpO2 96 %. No intake or output data in the 24 hours ending 03/20/21 0845 Filed Weights   03/13/21 1236 03/15/21 1428  Weight: 60.8 kg 60.8 kg    Examination: General: No acute respiratory distress Lungs: Clear to auscultation B without wheezing Cardiovascular: RRR without murmur Abdomen: Nondistended, soft, BS positive, no rebound Extremities: 2+ stable edema B LE - no erythema  CBC: Recent Labs  Lab 03/13/21 1317 03/14/21 0021 03/17/21 0226 03/18/21 0254 03/20/21 0429  WBC 9.4   < > 5.3 5.8 7.0  NEUTROABS 8.6*  --   --   --   --  HGB 9.8*   < > 9.0* 9.5* 10.3*  HCT 27.7*   < > 26.0* 27.4* 29.5*  MCV 96.5   < > 93.5 94.5 95.8  PLT 257   < > 196 211 241   < > = values in this interval not displayed.   Basic Metabolic Panel: Recent Labs  Lab 03/16/21 0845 03/17/21 0226 03/20/21 0429  NA 129* 125* 124*  K 4.0 4.0 4.2  CL 95* 95* 94*  CO2 25 23 23   GLUCOSE 137* 97 89  BUN 15 14 10   CREATININE 1.14* 1.04* 0.94  CALCIUM 8.2* 8.2* 8.1*    GFR: Estimated Creatinine Clearance: 43.7 mL/min (by C-G formula based on SCr of 0.94 mg/dL).  Liver Function Tests: Recent Labs  Lab 03/13/21 1317  AST 25  ALT 19  ALKPHOS 86  BILITOT 1.2  PROT 5.6*  ALBUMIN 2.8*    Coagulation Profile: Recent Labs  Lab 03/13/21 1432  INR 1.0     HbA1C: Hgb A1c MFr Bld  Date/Time Value Ref Range Status  10/13/2020 08:00 AM 5.6 4.6 - 6.5 % Final    Comment:    Glycemic Control Guidelines for People with Diabetes:Non Diabetic:  <6%Goal of Therapy: <7%Additional Action Suggested:  >8%   10/12/2019 08:12 AM 5.5 4.6 - 6.5 % Final    Comment:    Glycemic Control Guidelines for People with Diabetes:Non Diabetic:  <6%Goal of Therapy: <7%Additional Action Suggested:  >8%     CBG: Recent Labs  Lab 03/13/21 1430  GLUCAP 104*    Recent Results (from the past 240 hour(s))  Resp Panel by RT-PCR (Flu A&B, Covid) Nasopharyngeal Swab     Status: None   Collection Time: 03/13/21  4:35 PM   Specimen: Nasopharyngeal Swab; Nasopharyngeal(NP) swabs in vial transport medium  Result Value Ref Range Status   SARS Coronavirus 2 by RT PCR NEGATIVE NEGATIVE Final    Comment: (NOTE) SARS-CoV-2 target nucleic acids are NOT DETECTED.  The SARS-CoV-2 RNA is generally detectable in upper respiratory specimens during the acute phase of infection. The lowest concentration of SARS-CoV-2 viral copies this assay can detect is 138 copies/mL. A negative result does not preclude SARS-Cov-2 infection and should not be used as the sole basis for treatment or other patient management decisions. A negative result may occur with  improper specimen collection/handling, submission of specimen other than nasopharyngeal swab, presence of viral mutation(s) within the areas targeted by this assay, and inadequate number of viral copies(<138 copies/mL). A negative result must be combined with clinical observations, patient history, and epidemiological information. The  expected result is Negative.  Fact Sheet for Patients:  EntrepreneurPulse.com.au  Fact Sheet for Healthcare Providers:  IncredibleEmployment.be  This test is no t yet approved or cleared by the [place] FDA and  has been authorized for detection and/or diagnosis of SARS-CoV-2 by FDA under an Emergency Use Authorization (EUA). This EUA will remain  in effect (meaning this test can be used) for the duration of the COVID-19 declaration under Section 564(b)(1) of the Act, 21 U.S.C.section 360bbb-3(b)(1), unless the authorization is terminated  or revoked sooner.       Influenza A by PCR NEGATIVE NEGATIVE Final   Influenza B by PCR NEGATIVE NEGATIVE Final    Comment: (NOTE) The Xpert Xpress SARS-CoV-2/FLU/RSV plus assay is intended as an aid in the diagnosis of influenza from Nasopharyngeal swab specimens and should not be used as a sole basis for treatment. Nasal washings and aspirates are unacceptable for Xpert  Xpress SARS-CoV-2/FLU/RSV testing.  Fact Sheet for Patients: EntrepreneurPulse.com.au  Fact Sheet for Healthcare Providers: IncredibleEmployment.be  This test is not yet approved or cleared by the [place] FDA and has been authorized for detection and/or diagnosis of SARS-CoV-2 by FDA under an Emergency Use Authorization (EUA). This EUA will remain in effect (meaning this test can be used) for the duration of the COVID-19 declaration under Section 564(b)(1) of the Act, 21 U.S.C. section 360bbb-3(b)(1), unless the authorization is terminated or revoked.  Performed at [facility], [place] [address], [place], [place] [address]   Surgical pcr screen     Status: None   Collection Time: 03/13/21  9:54 PM   Specimen: Nasal Mucosa; Nasal Swab  Result Value Ref Range Status   MRSA, PCR NEGATIVE NEGATIVE Final   Staphylococcus aureus NEGATIVE NEGATIVE Final    Comment:  (NOTE) The Xpert SA Assay (FDA approved for NASAL specimens in patients 80 years of age and older), is one component of a comprehensive surveillance program. It is not intended to diagnose infection nor to guide or monitor treatment. Performed at [facility], [place] [address] [place],  [address]      Scheduled Meds: . acyclovir  400 mg Oral BID  . apixaban  10 mg Oral BID   Followed by  . [START ON 03/21/2021] apixaban  5 mg Oral BID  . cholecalciferol  2,000 Units Oral Daily  . melatonin  10 mg Oral QHS  . metoprolol tartrate  12.5 mg Oral BID  . rosuvastatin  10 mg Oral Daily  . senna-docusate  1 tablet Oral BID  . zolpidem  5 mg Oral QHS      LOS: 7 days   Cherene Altes, MD [facility] Office  [phone] Pager - Text Page per Amion  If 7PM-7AM, please contact night-coverage per Amion 03/20/2021, 8:45 AM

## 2021-03-21 ENCOUNTER — Telehealth: Payer: Self-pay

## 2021-03-21 NOTE — Telephone Encounter (Signed)
Transition Care Management Follow-up Telephone Call  Date of discharge and from where: 03/20/2021, [name]   How have you been since you were released from the hospital? Patient is doing fine resting at home.  Any questions or concerns? No  Items Reviewed:  Did the pt receive and understand the discharge instructions provided? Yes   Medications obtained and verified? Yes   Other? No   Any new allergies since your discharge? No   Dietary orders reviewed? Yes  Do you have support at home? Yes   Home Care and Equipment/Supplies: Were home health services ordered? yes If so, what is the name of the agency? [facility]  Has the agency set up a time to come to the patient's home? yes Were any new equipment or medical supplies ordered?  No What is the name of the medical supply agency? N/A Were you able to get the supplies/equipment? not applicable Do you have any questions related to the use of the equipment or supplies? No  Functional Questionnaire: (I = Independent and D = Dependent) ADLs: I  Bathing/Dressing- I  Meal Prep- I  Eating- I  Maintaining continence- I  Transferring/Ambulation- I  Managing Meds- I  Follow up appointments reviewed:   PCP Hospital f/u appt confirmed? Yes  Scheduled to see Wendy [name] on 04/04/2021 @ 3 pm.  [facility] f/u appt confirmed? follow up with vascular surgery   Are transportation arrangements needed? No   If their condition worsens, is the pt aware to call PCP or go to the Emergency Dept.? Yes  Was the patient provided with contact information for the PCP's office or ED? Yes  Was to pt encouraged to call back with questions or concerns? Yes

## 2021-03-23 ENCOUNTER — Other Ambulatory Visit: Payer: Self-pay

## 2021-03-23 ENCOUNTER — Inpatient Hospital Stay: Payer: Medicare Other

## 2021-03-23 ENCOUNTER — Telehealth: Payer: Self-pay

## 2021-03-23 ENCOUNTER — Inpatient Hospital Stay (HOSPITAL_BASED_OUTPATIENT_CLINIC_OR_DEPARTMENT_OTHER): Payer: Medicare Other | Admitting: Hematology and Oncology

## 2021-03-23 VITALS — BP 121/86 | HR 78 | Temp 97.3°F | Resp 17 | Wt 143.8 lb

## 2021-03-23 DIAGNOSIS — E8581 Light chain (AL) amyloidosis: Secondary | ICD-10-CM

## 2021-03-23 DIAGNOSIS — R803 Bence Jones proteinuria: Secondary | ICD-10-CM | POA: Diagnosis not present

## 2021-03-23 DIAGNOSIS — Z5111 Encounter for antineoplastic chemotherapy: Secondary | ICD-10-CM | POA: Diagnosis present

## 2021-03-23 DIAGNOSIS — Z79899 Other long term (current) drug therapy: Secondary | ICD-10-CM | POA: Diagnosis not present

## 2021-03-23 DIAGNOSIS — Z5112 Encounter for antineoplastic immunotherapy: Secondary | ICD-10-CM | POA: Diagnosis present

## 2021-03-23 LAB — CBC WITH DIFFERENTIAL (CANCER CENTER ONLY)
Abs Immature Granulocytes: 0.03 10*3/uL (ref 0.00–0.07)
Basophils Absolute: 0 10*3/uL (ref 0.0–0.1)
Basophils Relative: 1 %
Eosinophils Absolute: 0.1 10*3/uL (ref 0.0–0.5)
Eosinophils Relative: 2 %
HCT: 32.5 % — ABNORMAL LOW (ref 36.0–46.0)
Hemoglobin: 11.2 g/dL — ABNORMAL LOW (ref 12.0–15.0)
Immature Granulocytes: 1 %
Lymphocytes Relative: 3 %
Lymphs Abs: 0.2 10*3/uL — ABNORMAL LOW (ref 0.7–4.0)
MCH: 32.1 pg (ref 26.0–34.0)
MCHC: 34.5 g/dL (ref 30.0–36.0)
MCV: 93.1 fL (ref 80.0–100.0)
Monocytes Absolute: 0.5 10*3/uL (ref 0.1–1.0)
Monocytes Relative: 8 %
Neutro Abs: 5.3 10*3/uL (ref 1.7–7.7)
Neutrophils Relative %: 85 %
Platelet Count: 310 10*3/uL (ref 150–400)
RBC: 3.49 MIL/uL — ABNORMAL LOW (ref 3.87–5.11)
RDW: 13.6 % (ref 11.5–15.5)
WBC Count: 6.1 10*3/uL (ref 4.0–10.5)
nRBC: 0 % (ref 0.0–0.2)

## 2021-03-23 LAB — CMP (CANCER CENTER ONLY)
ALT: 14 U/L (ref 0–44)
AST: 20 U/L (ref 15–41)
Albumin: 2 g/dL — ABNORMAL LOW (ref 3.5–5.0)
Alkaline Phosphatase: 84 U/L (ref 38–126)
Anion gap: 12 (ref 5–15)
BUN: 12 mg/dL (ref 8–23)
CO2: 25 mmol/L (ref 22–32)
Calcium: 8.2 mg/dL — ABNORMAL LOW (ref 8.9–10.3)
Chloride: 93 mmol/L — ABNORMAL LOW (ref 98–111)
Creatinine: 0.8 mg/dL (ref 0.44–1.00)
GFR, Estimated: >60 mL/min (ref >60)
Glucose, Bld: 94 mg/dL (ref 70–99)
Potassium: 4.4 mmol/L (ref 3.5–5.1)
Sodium: 130 mmol/L — ABNORMAL LOW (ref 135–145)
Total Bilirubin: 0.4 mg/dL (ref 0.3–1.2)
Total Protein: 5 g/dL — ABNORMAL LOW (ref 6.5–8.1)

## 2021-03-23 LAB — LACTATE DEHYDROGENASE: LDH: 272 U/L — ABNORMAL HIGH (ref 98–192)

## 2021-03-23 MED ORDER — ACETAMINOPHEN 325 MG PO TABS
650.0000 mg | ORAL_TABLET | Freq: Once | ORAL | Status: AC
  Administered 2021-03-23: 650 mg via ORAL

## 2021-03-23 MED ORDER — DEXAMETHASONE 4 MG PO TABS
10.0000 mg | ORAL_TABLET | Freq: Once | ORAL | Status: AC
  Administered 2021-03-23: 10 mg via ORAL

## 2021-03-23 MED ORDER — DEXAMETHASONE 4 MG PO TABS
ORAL_TABLET | ORAL | Status: AC
  Filled 2021-03-23: qty 3

## 2021-03-23 MED ORDER — DARATUMUMAB-HYALURONIDASE-FIHJ 1800-30000 MG-UT/15ML ~~LOC~~ SOLN
1800.0000 mg | Freq: Once | SUBCUTANEOUS | Status: AC
  Administered 2021-03-23: 1800 mg via SUBCUTANEOUS
  Filled 2021-03-23: qty 15

## 2021-03-23 MED ORDER — ACETAMINOPHEN 325 MG PO TABS
ORAL_TABLET | ORAL | Status: AC
  Filled 2021-03-23: qty 2

## 2021-03-23 MED ORDER — SODIUM CHLORIDE 0.9 % IV SOLN
300.0000 mg/m2 | Freq: Once | INTRAVENOUS | Status: AC
  Administered 2021-03-23: 480 mg via INTRAVENOUS
  Filled 2021-03-23: qty 24

## 2021-03-23 NOTE — Telephone Encounter (Signed)
That sounds fine, thanks for the update

## 2021-03-23 NOTE — Telephone Encounter (Signed)
[name] nurse with [facility] left v/m that she did nursing eval for pt today and the pt seems to be doing really well. Pt does not think she needs HH nursing at this time. Pt knows what to do with her meds, will elevate legs, and what pt should look for if problems. [name] does not think needs request for [facility] nurse visits at this time and if pt thinks needed she will reach back out to [facility].

## 2021-03-24 ENCOUNTER — Telehealth: Payer: Self-pay

## 2021-03-24 NOTE — Telephone Encounter (Signed)
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 [name] saw her yesterday and incision looks good. Per MD, pt is to wear compression hose. [name] is aware and will let pt and other home therapists seeing her know, as they have to help her with application/removal of the hose.

## 2021-03-26 ENCOUNTER — Encounter: Payer: Self-pay | Admitting: Hematology and Oncology

## 2021-03-26 NOTE — Progress Notes (Signed)
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  This explains the kidney dysfunction and the lambda light chain predominance. --At this time we know that there is renal involvement, however there is not appear to be any cardiac, colon, or liver involvement at this time.  We ordered a TTE which showed normal EF and Grade I diastolic dysfunction.  --Recommend daratumumab, CyBorD per the [place] study.  Cycle 1 Day 1  started on 02/09/2021. --due to respiratory complications on 8/88/2800 will hold Velcade --Given the patient's advanced age she would be considered transplant ineligible --RTC every 2 weeks while on therapy with weekly treatments initially.  #Acute Hypoxic Respiratory Failure, resolved #Lower Extremity Swelling, stable #Bilateral Lower Extremity DVTs --respiratory symptoms resolved with clear lungs and no hypoxia --patient agreeable to continuation of daratumumab/cyclophosphamide with decreased dex. Holding Velcade.   --currently on anticoagulation given the recent findings of foreign body in the IVC and subsequent DVTs --further decreased dexamethasone to 31m PO weekly --continue Bumex per nephrology. 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   #Supportive Care --chemotherapy education complete --zofran [id number] PRN and compazine 1081mO q6H for nausea --acyclovir 400m65m BID for VCZ prophylaxis --albuterol for possible bronchospasm with daratumumab --recommend PPI therapy for stomach protection from steroids --Patient declines port at this time -- no pain medication required at this time.   No orders of the defined types were placed in this encounter.  All questions were answered. The patient knows to call the clinic with any problems, questions or concerns.  A total of more than 30 minutes were spent on this encounter and over half of that time was spent on counseling and coordination of care as outlined above.   JohnLedell Peoples Department of Hematology/Oncology [facility]: [phone]: [phone]: [email or web address]  03/26/2021 4:34 PM

## 2021-03-29 ENCOUNTER — Other Ambulatory Visit (HOSPITAL_COMMUNITY): Payer: Self-pay

## 2021-03-30 ENCOUNTER — Inpatient Hospital Stay: Payer: Medicare Other | Attending: Hematology and Oncology

## 2021-03-30 ENCOUNTER — Inpatient Hospital Stay: Payer: Medicare Other

## 2021-03-30 ENCOUNTER — Other Ambulatory Visit (HOSPITAL_COMMUNITY): Payer: Self-pay

## 2021-03-30 ENCOUNTER — Other Ambulatory Visit: Payer: Self-pay

## 2021-03-30 VITALS — BP 145/86 | HR 86 | Temp 98.2°F | Resp 18

## 2021-03-30 DIAGNOSIS — Z79899 Other long term (current) drug therapy: Secondary | ICD-10-CM | POA: Insufficient documentation

## 2021-03-30 DIAGNOSIS — Z5111 Encounter for antineoplastic chemotherapy: Secondary | ICD-10-CM | POA: Diagnosis present

## 2021-03-30 DIAGNOSIS — Z5112 Encounter for antineoplastic immunotherapy: Secondary | ICD-10-CM | POA: Diagnosis present

## 2021-03-30 DIAGNOSIS — E8581 Light chain (AL) amyloidosis: Secondary | ICD-10-CM | POA: Insufficient documentation

## 2021-03-30 LAB — CBC WITH DIFFERENTIAL (CANCER CENTER ONLY)
Abs Immature Granulocytes: 0.08 10*3/uL — ABNORMAL HIGH (ref 0.00–0.07)
Basophils Absolute: 0.1 10*3/uL (ref 0.0–0.1)
Basophils Relative: 1 %
Eosinophils Absolute: 0.1 10*3/uL (ref 0.0–0.5)
Eosinophils Relative: 2 %
HCT: 32.9 % — ABNORMAL LOW (ref 36.0–46.0)
Hemoglobin: 11.1 g/dL — ABNORMAL LOW (ref 12.0–15.0)
Immature Granulocytes: 1 %
Lymphocytes Relative: 5 %
Lymphs Abs: 0.3 10*3/uL — ABNORMAL LOW (ref 0.7–4.0)
MCH: 31.8 pg (ref 26.0–34.0)
MCHC: 33.7 g/dL (ref 30.0–36.0)
MCV: 94.3 fL (ref 80.0–100.0)
Monocytes Absolute: 0.5 10*3/uL (ref 0.1–1.0)
Monocytes Relative: 8 %
Neutro Abs: 4.8 10*3/uL (ref 1.7–7.7)
Neutrophils Relative %: 83 %
Platelet Count: 354 10*3/uL (ref 150–400)
RBC: 3.49 MIL/uL — ABNORMAL LOW (ref 3.87–5.11)
RDW: 14.1 % (ref 11.5–15.5)
WBC Count: 5.8 10*3/uL (ref 4.0–10.5)
nRBC: 0 % (ref 0.0–0.2)

## 2021-03-30 LAB — CMP (CANCER CENTER ONLY)
ALT: 12 U/L (ref 0–44)
AST: 19 U/L (ref 15–41)
Albumin: 2.5 g/dL — ABNORMAL LOW (ref 3.5–5.0)
Alkaline Phosphatase: 88 U/L (ref 38–126)
Anion gap: 14 (ref 5–15)
BUN: 16 mg/dL (ref 8–23)
CO2: 25 mmol/L (ref 22–32)
Calcium: 8.5 mg/dL — ABNORMAL LOW (ref 8.9–10.3)
Chloride: 94 mmol/L — ABNORMAL LOW (ref 98–111)
Creatinine: 0.81 mg/dL (ref 0.44–1.00)
GFR, Estimated: >60 mL/min (ref >60)
Glucose, Bld: 91 mg/dL (ref 70–99)
Potassium: 3.5 mmol/L (ref 3.5–5.1)
Sodium: 133 mmol/L — ABNORMAL LOW (ref 135–145)
Total Bilirubin: 0.5 mg/dL (ref 0.3–1.2)
Total Protein: 5.4 g/dL — ABNORMAL LOW (ref 6.5–8.1)

## 2021-03-30 LAB — LACTATE DEHYDROGENASE: LDH: 308 U/L — ABNORMAL HIGH (ref 98–192)

## 2021-03-30 MED ORDER — DEXAMETHASONE 4 MG PO TABS
ORAL_TABLET | ORAL | Status: AC
  Filled 2021-03-30: qty 3

## 2021-03-30 MED ORDER — DEXAMETHASONE 4 MG PO TABS
10.0000 mg | ORAL_TABLET | Freq: Once | ORAL | Status: AC
Start: 2021-03-30 — End: 2021-03-30
  Administered 2021-03-30: 10 mg via ORAL

## 2021-03-30 MED ORDER — SODIUM CHLORIDE 0.9 % IV SOLN
300.0000 mg/m2 | Freq: Once | INTRAVENOUS | Status: AC
  Administered 2021-03-30: 480 mg via INTRAVENOUS
  Filled 2021-03-30: qty 24

## 2021-03-30 MED ORDER — DARATUMUMAB-HYALURONIDASE-FIHJ 1800-30000 MG-UT/15ML ~~LOC~~ SOLN
1800.0000 mg | Freq: Once | SUBCUTANEOUS | Status: AC
  Administered 2021-03-30: 1800 mg via SUBCUTANEOUS
  Filled 2021-03-30: qty 15

## 2021-03-30 MED ORDER — ACETAMINOPHEN 325 MG PO TABS
ORAL_TABLET | ORAL | Status: AC
  Filled 2021-03-30: qty 2

## 2021-03-30 MED ORDER — ACETAMINOPHEN 325 MG PO TABS
650.0000 mg | ORAL_TABLET | Freq: Once | ORAL | Status: AC
  Administered 2021-03-30: 650 mg via ORAL

## 2021-03-30 NOTE — Patient Instructions (Signed)
[facility] Discharge Instructions for Patients Receiving Chemotherapy  Today you received the following chemotherapy agents: Cytoxan, Darazalex Faspro  To help prevent nausea and vomiting after your treatment, we encourage you to take your nausea medication as directed.   If you develop nausea and vomiting that is not controlled by your nausea medication, call the clinic.   BELOW ARE SYMPTOMS THAT SHOULD BE REPORTED IMMEDIATELY:  *FEVER GREATER THAN 100.5 F  *CHILLS WITH OR WITHOUT FEVER  NAUSEA AND VOMITING THAT IS NOT CONTROLLED WITH YOUR NAUSEA MEDICATION  *UNUSUAL SHORTNESS OF BREATH  *UNUSUAL BRUISING OR BLEEDING  TENDERNESS IN MOUTH AND THROAT WITH OR WITHOUT PRESENCE OF ULCERS  *URINARY PROBLEMS  *BOWEL PROBLEMS  UNUSUAL RASH Items with * indicate a potential emergency and should be followed up as soon as possible.  Feel free to call the clinic should you have any questions or concerns. The clinic phone number is [phone].  Please show the [facility] at check-in to the Emergency Department and triage nurse.

## 2021-03-31 DIAGNOSIS — E8581 Light chain (AL) amyloidosis: Secondary | ICD-10-CM

## 2021-03-31 DIAGNOSIS — Z7901 Long term (current) use of anticoagulants: Secondary | ICD-10-CM

## 2021-03-31 DIAGNOSIS — D630 Anemia in neoplastic disease: Secondary | ICD-10-CM

## 2021-03-31 DIAGNOSIS — Z9181 History of falling: Secondary | ICD-10-CM

## 2021-03-31 DIAGNOSIS — E871 Hypo-osmolality and hyponatremia: Secondary | ICD-10-CM

## 2021-03-31 DIAGNOSIS — I82403 Acute embolism and thrombosis of unspecified deep veins of lower extremity, bilateral: Secondary | ICD-10-CM | POA: Diagnosis not present

## 2021-03-31 DIAGNOSIS — N179 Acute kidney failure, unspecified: Secondary | ICD-10-CM

## 2021-03-31 DIAGNOSIS — I10 Essential (primary) hypertension: Secondary | ICD-10-CM

## 2021-03-31 DIAGNOSIS — Z87891 Personal history of nicotine dependence: Secondary | ICD-10-CM

## 2021-03-31 DIAGNOSIS — C9000 Multiple myeloma not having achieved remission: Secondary | ICD-10-CM | POA: Diagnosis not present

## 2021-03-31 DIAGNOSIS — Z48812 Encounter for surgical aftercare following surgery on the circulatory system: Secondary | ICD-10-CM | POA: Diagnosis not present

## 2021-03-31 DIAGNOSIS — E785 Hyperlipidemia, unspecified: Secondary | ICD-10-CM

## 2021-03-31 DIAGNOSIS — G4700 Insomnia, unspecified: Secondary | ICD-10-CM

## 2021-03-31 DIAGNOSIS — K219 Gastro-esophageal reflux disease without esophagitis: Secondary | ICD-10-CM

## 2021-03-31 DIAGNOSIS — I700 Atherosclerosis of aorta: Secondary | ICD-10-CM

## 2021-03-31 DIAGNOSIS — M8588 Other specified disorders of bone density and structure, other site: Secondary | ICD-10-CM

## 2021-03-31 DIAGNOSIS — Z86010 Personal history of colonic polyps: Secondary | ICD-10-CM

## 2021-03-31 DIAGNOSIS — M8008XD Age-related osteoporosis with current pathological fracture, vertebra(e), subsequent encounter for fracture with routine healing: Secondary | ICD-10-CM | POA: Diagnosis not present

## 2021-03-31 DIAGNOSIS — M1990 Unspecified osteoarthritis, unspecified site: Secondary | ICD-10-CM

## 2021-03-31 LAB — KAPPA/LAMBDA LIGHT CHAINS
Kappa free light chain: 8.8 mg/L (ref 3.3–19.4)
Kappa, lambda light chain ratio: 0.06 — ABNORMAL LOW (ref 0.26–1.65)
Lambda free light chains: 142.2 mg/L — ABNORMAL HIGH (ref 5.7–26.3)

## 2021-04-03 ENCOUNTER — Other Ambulatory Visit (HOSPITAL_COMMUNITY): Payer: Self-pay

## 2021-04-03 ENCOUNTER — Other Ambulatory Visit: Payer: Self-pay | Admitting: *Deleted

## 2021-04-03 DIAGNOSIS — I824Z3 Acute embolism and thrombosis of unspecified deep veins of distal lower extremity, bilateral: Secondary | ICD-10-CM

## 2021-04-03 LAB — MULTIPLE MYELOMA PANEL, SERUM
Albumin SerPl Elph-Mcnc: 2.4 g/dL — ABNORMAL LOW (ref 2.9–4.4)
Albumin/Glob SerPl: 1.1 (ref 0.7–1.7)
Alpha 1: 0.4 g/dL (ref 0.0–0.4)
Alpha2 Glob SerPl Elph-Mcnc: 0.9 g/dL (ref 0.4–1.0)
B-Globulin SerPl Elph-Mcnc: 0.9 g/dL (ref 0.7–1.3)
Gamma Glob SerPl Elph-Mcnc: 0.2 g/dL — ABNORMAL LOW (ref 0.4–1.8)
Globulin, Total: 2.4 g/dL (ref 2.2–3.9)
IgA: 63 mg/dL — ABNORMAL LOW (ref 64–422)
IgG (Immunoglobin G), Serum: 292 mg/dL — ABNORMAL LOW (ref 586–1602)
IgM (Immunoglobulin M), Srm: 29 mg/dL (ref 26–217)
M Protein SerPl Elph-Mcnc: 0.1 g/dL — ABNORMAL HIGH
Total Protein ELP: 4.8 g/dL — ABNORMAL LOW (ref 6.0–8.5)

## 2021-04-04 ENCOUNTER — Encounter: Payer: Self-pay | Admitting: Family Medicine

## 2021-04-04 ENCOUNTER — Other Ambulatory Visit: Payer: Self-pay | Admitting: Family Medicine

## 2021-04-04 ENCOUNTER — Other Ambulatory Visit: Payer: Self-pay

## 2021-04-04 ENCOUNTER — Ambulatory Visit (INDEPENDENT_AMBULATORY_CARE_PROVIDER_SITE_OTHER): Payer: Medicare Other | Admitting: Family Medicine

## 2021-04-04 VITALS — BP 126/74 | HR 90 | Temp 96.9°F | Ht 62.0 in | Wt 134.6 lb

## 2021-04-04 DIAGNOSIS — C9000 Multiple myeloma not having achieved remission: Secondary | ICD-10-CM

## 2021-04-04 DIAGNOSIS — I824Z3 Acute embolism and thrombosis of unspecified deep veins of distal lower extremity, bilateral: Secondary | ICD-10-CM

## 2021-04-04 DIAGNOSIS — R600 Localized edema: Secondary | ICD-10-CM | POA: Diagnosis not present

## 2021-04-04 DIAGNOSIS — F418 Other specified anxiety disorders: Secondary | ICD-10-CM

## 2021-04-04 DIAGNOSIS — E871 Hypo-osmolality and hyponatremia: Secondary | ICD-10-CM

## 2021-04-04 DIAGNOSIS — I10 Essential (primary) hypertension: Secondary | ICD-10-CM

## 2021-04-04 LAB — UPEP/UIFE/LIGHT CHAINS/TP, 24-HR UR
% BETA, Urine: 9.4 %
ALPHA 1 URINE: 12.8 %
Albumin, U: 65.7 %
Alpha 2, Urine: 10.5 %
Free Kappa Lt Chains,Ur: 8.35 mg/L (ref 1.17–86.46)
Free Kappa/Lambda Ratio: 0.18 — ABNORMAL LOW (ref 1.83–14.26)
Free Lambda Lt Chains,Ur: 45.24 mg/L — ABNORMAL HIGH (ref 0.27–15.21)
GAMMA GLOBULIN URINE: 1.6 %
M-SPIKE %, Urine: 0.7 % — ABNORMAL HIGH
M-Spike, Mg/24 Hr: 14 mg/24 hr — ABNORMAL HIGH
Total Protein, Urine-Ur/day: 1950 mg/24 hr — ABNORMAL HIGH (ref 30–150)
Total Protein, Urine: 216.7 mg/dL
Total Volume: 900

## 2021-04-04 MED ORDER — ALPRAZOLAM 0.5 MG PO TABS: 0.2500 mg | ORAL_TABLET | Freq: Every day | ORAL | 1 refills | Status: DC | PRN
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
## 2021-04-04 NOTE — Assessment & Plan Note (Signed)
Pt occasionally needs xanax at night if she cannot go back to sleep  Very careful re: sedation and balance issues/using walker Also aware or potential for habit

## 2021-04-04 NOTE — Assessment & Plan Note (Addendum)
Recent hospitalization for bilat DVT /complicated by extension into IVC Reviewed hospital records, lab results and studies in detail   S/p vascular surgery /thromectomy (and also removal of fb from IVC)  Did well and tolerating eliquis (this tx will be indefinite) In setting of multiple myeloma and treatment Has had HH/PT and uses walker prn  Making good progress at home

## 2021-04-04 NOTE — Telephone Encounter (Signed)
Pt was here for hospital f/u appt., not sure if she mentioned needing this, last filled on 12/07/19

## 2021-04-04 NOTE — Patient Instructions (Signed)
Use your walker when you need it  Take breaks when you need to   I sent in xanax  Keep eating regularly  Peanut butter is good   Blood pressure is good today on current medicines

## 2021-04-04 NOTE — Progress Notes (Signed)
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 complications on 5/68/1275 will hold Velcade --Given the patient's advanced age she would be considered transplant ineligible --initial restaging labs show an excellent early response to therapy.  --RTC every 2 weeks while on therapy with weekly treatments initially.  #Acute Hypoxic Respiratory Failure, resolved #Lower Extremity Swelling, stable #Bilateral Lower Extremity DVTs --respiratory symptoms resolved with  clear lungs and no hypoxia --patient agreeable to continuation of daratumumab/cyclophosphamide with decreased dex. Holding Velcade.  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   #Supportive  Care --chemotherapy education complete --zofran [id number] PRN and compazine 1082mO q6H for nausea --acyclovir 400m79m BID for VCZ prophylaxis --albuterol for possible bronchospasm with daratumumab --recommend PPI therapy for stomach protection from steroids --Patient declines port at this time -- no pain medication required at this time.   No orders of the defined types were placed in this encounter.  All questions were answered. The patient knows to call the clinic with any problems, questions or concerns.  A total of more than 30 minutes were spent on this encounter and over half of that time was spent on counseling and coordination of care as outlined above.   JohnLedell Peoples Department of Hematology/[facility]: [phone]: [phone]: [email or web address]  04/06/2021 1:41 PM

## 2021-04-04 NOTE — Assessment & Plan Note (Signed)
Fairly stable to improved at 133 on 03/30/21 She watches fluid intake  Continues renal care

## 2021-04-04 NOTE — Assessment & Plan Note (Signed)
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

## 2021-04-04 NOTE — Assessment & Plan Note (Addendum)
bp in fair control at this time  BP Readings from Last 1 Encounters:  04/04/21 126/74   No changes needed Most recent labs reviewed  Disc lifstyle change with low sodium diet and exercise  Plan to continue metoprolol 12.5 mg bid  bumex 0.5 mg daily and prn Stable without losartan at this time

## 2021-04-04 NOTE — Assessment & Plan Note (Signed)
In treatment from oncology -infusions with intermittent prednisone  Per pt this causes pedal edema but not nausea  Appetite is fair  Getting strength back

## 2021-04-05 ENCOUNTER — Inpatient Hospital Stay: Payer: Medicare Other

## 2021-04-05 ENCOUNTER — Inpatient Hospital Stay (HOSPITAL_BASED_OUTPATIENT_CLINIC_OR_DEPARTMENT_OTHER): Payer: Medicare Other | Admitting: Hematology and Oncology

## 2021-04-05 ENCOUNTER — Other Ambulatory Visit: Payer: Self-pay

## 2021-04-05 VITALS — BP 149/78 | HR 87 | Temp 99.0°F | Resp 18 | Ht 62.0 in | Wt 134.8 lb

## 2021-04-05 VITALS — BP 120/63

## 2021-04-05 DIAGNOSIS — E8581 Light chain (AL) amyloidosis: Secondary | ICD-10-CM

## 2021-04-05 DIAGNOSIS — R803 Bence Jones proteinuria: Secondary | ICD-10-CM

## 2021-04-05 DIAGNOSIS — Z5111 Encounter for antineoplastic chemotherapy: Secondary | ICD-10-CM | POA: Diagnosis not present

## 2021-04-05 LAB — CBC WITH DIFFERENTIAL (CANCER CENTER ONLY)
Abs Immature Granulocytes: 0.02 10*3/uL (ref 0.00–0.07)
Basophils Absolute: 0 10*3/uL (ref 0.0–0.1)
Basophils Relative: 1 %
Eosinophils Absolute: 0.1 10*3/uL (ref 0.0–0.5)
Eosinophils Relative: 1 %
HCT: 31.1 % — ABNORMAL LOW (ref 36.0–46.0)
Hemoglobin: 10.7 g/dL — ABNORMAL LOW (ref 12.0–15.0)
Immature Granulocytes: 0 %
Lymphocytes Relative: 6 %
Lymphs Abs: 0.3 10*3/uL — ABNORMAL LOW (ref 0.7–4.0)
MCH: 32.3 pg (ref 26.0–34.0)
MCHC: 34.4 g/dL (ref 30.0–36.0)
MCV: 94 fL (ref 80.0–100.0)
Monocytes Absolute: 0.4 10*3/uL (ref 0.1–1.0)
Monocytes Relative: 8 %
Neutro Abs: 4.7 10*3/uL (ref 1.7–7.7)
Neutrophils Relative %: 84 %
Platelet Count: 355 10*3/uL (ref 150–400)
RBC: 3.31 MIL/uL — ABNORMAL LOW (ref 3.87–5.11)
RDW: 14.5 % (ref 11.5–15.5)
WBC Count: 5.5 10*3/uL (ref 4.0–10.5)
nRBC: 0 % (ref 0.0–0.2)

## 2021-04-05 LAB — CMP (CANCER CENTER ONLY)
ALT: 9 U/L (ref 0–44)
AST: 15 U/L (ref 15–41)
Albumin: 2.4 g/dL — ABNORMAL LOW (ref 3.5–5.0)
Alkaline Phosphatase: 81 U/L (ref 38–126)
Anion gap: 12 (ref 5–15)
BUN: 16 mg/dL (ref 8–23)
CO2: 28 mmol/L (ref 22–32)
Calcium: 8.5 mg/dL — ABNORMAL LOW (ref 8.9–10.3)
Chloride: 93 mmol/L — ABNORMAL LOW (ref 98–111)
Creatinine: 0.79 mg/dL (ref 0.44–1.00)
GFR, Estimated: >60 mL/min (ref >60)
Glucose, Bld: 102 mg/dL — ABNORMAL HIGH (ref 70–99)
Potassium: 3.7 mmol/L (ref 3.5–5.1)
Sodium: 133 mmol/L — ABNORMAL LOW (ref 135–145)
Total Bilirubin: 0.5 mg/dL (ref 0.3–1.2)
Total Protein: 5.3 g/dL — ABNORMAL LOW (ref 6.5–8.1)

## 2021-04-05 LAB — LACTATE DEHYDROGENASE: LDH: 258 U/L — ABNORMAL HIGH (ref 98–192)

## 2021-04-05 MED ORDER — SODIUM CHLORIDE 0.9 % IV SOLN
Freq: Once | INTRAVENOUS | Status: AC
Start: 2021-04-05 — End: 2021-04-05
  Filled 2021-04-05: qty 250

## 2021-04-05 MED ORDER — SODIUM CHLORIDE 0.9 % IV SOLN
300.0000 mg/m2 | Freq: Once | INTRAVENOUS | Status: AC
  Administered 2021-04-05: 480 mg via INTRAVENOUS
  Filled 2021-04-05: qty 24

## 2021-04-05 MED ORDER — ACETAMINOPHEN 325 MG PO TABS
ORAL_TABLET | ORAL | Status: AC
  Filled 2021-04-05: qty 2

## 2021-04-05 MED ORDER — DEXAMETHASONE 4 MG PO TABS
10.0000 mg | ORAL_TABLET | Freq: Once | ORAL | Status: AC
  Administered 2021-04-05: 10 mg via ORAL

## 2021-04-05 MED ORDER — ACETAMINOPHEN 325 MG PO TABS
650.0000 mg | ORAL_TABLET | Freq: Once | ORAL | Status: AC
  Administered 2021-04-05: 650 mg via ORAL

## 2021-04-05 MED ORDER — DEXAMETHASONE 4 MG PO TABS
ORAL_TABLET | ORAL | Status: AC
  Filled 2021-04-05: qty 3

## 2021-04-05 MED ORDER — DEXAMETHASONE 4 MG PO TABS
ORAL_TABLET | ORAL | Status: AC
  Filled 2021-04-05: qty 1

## 2021-04-05 MED ORDER — DARATUMUMAB-HYALURONIDASE-FIHJ 1800-30000 MG-UT/15ML ~~LOC~~ SOLN
1800.0000 mg | Freq: Once | SUBCUTANEOUS | Status: AC
  Administered 2021-04-05: 1800 mg via SUBCUTANEOUS
  Filled 2021-04-05: qty 15

## 2021-04-05 NOTE — Telephone Encounter (Signed)
Left message for patient to call me back to follow up

## 2021-04-05 NOTE — Patient Instructions (Signed)
[place] Discharge Instructions for Patients Receiving Chemotherapy  Today you received the following chemotherapy agents: Cytoxan, Darazalex Faspro  To help prevent nausea and vomiting after your treatment, we encourage you to take your nausea medication as directed.   If you develop nausea and vomiting that is not controlled by your nausea medication, call the clinic.   BELOW ARE SYMPTOMS THAT SHOULD BE REPORTED IMMEDIATELY:  *FEVER GREATER THAN 100.5 F  *CHILLS WITH OR WITHOUT FEVER  NAUSEA AND VOMITING THAT IS NOT CONTROLLED WITH YOUR NAUSEA MEDICATION  *UNUSUAL SHORTNESS OF BREATH  *UNUSUAL BRUISING OR BLEEDING  TENDERNESS IN MOUTH AND THROAT WITH OR WITHOUT PRESENCE OF ULCERS  *URINARY PROBLEMS  *BOWEL PROBLEMS  UNUSUAL RASH Items with * indicate a potential emergency and should be followed up as soon as possible.  Feel free to call the clinic should you have any questions or concerns. The clinic phone number is [phone].  Please show the [address] at check-in to the Emergency Department and triage nurse.

## 2021-04-05 NOTE — Telephone Encounter (Signed)
Yes, please do. Thanks.

## 2021-04-11 ENCOUNTER — Telehealth: Payer: Self-pay | Admitting: *Deleted

## 2021-04-11 NOTE — Telephone Encounter (Signed)
Spoke with patient. She is having labs on 05/04/21 through [facility] and is scheduled for NV with [place] on 05/09/21

## 2021-04-11 NOTE — Telephone Encounter (Signed)
Wendy [name] states that she continues to have her usual fluid retention, but is now having fluid seeping from the inside of her left knee. Fluid is clear, no skin irritation. Started Monday night, slept with a towel. Left leg is now less puffy than right leg due to fluid seeping. Took 2 Bumex today to try to help get rid of fluid. Will keep leg elevated as much as possible.

## 2021-04-12 ENCOUNTER — Telehealth: Payer: Self-pay | Admitting: *Deleted

## 2021-04-12 ENCOUNTER — Inpatient Hospital Stay: Payer: Medicare Other | Admitting: Pulmonary Disease

## 2021-04-12 NOTE — Telephone Encounter (Signed)
Received call from patient stating that she has developed area on her leg leg above her knee that drains serous fluid.  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  TCT patient and advised that this situation is not unusual due to the significant swelling in her legs.  She is wrapping a towel around her leg to help contain the leakage. She also states she is making progress with her home PT and has been able to get upstairs to her bedroom.  She is very happy about that. She is aware of her appt here tomorrow.

## 2021-04-13 ENCOUNTER — Inpatient Hospital Stay: Payer: Medicare Other

## 2021-04-13 ENCOUNTER — Other Ambulatory Visit: Payer: Self-pay | Admitting: Hematology and Oncology

## 2021-04-13 ENCOUNTER — Other Ambulatory Visit: Payer: Self-pay

## 2021-04-13 VITALS — BP 139/72 | HR 79 | Temp 97.8°F | Resp 20 | Wt 130.8 lb

## 2021-04-13 DIAGNOSIS — E8581 Light chain (AL) amyloidosis: Secondary | ICD-10-CM

## 2021-04-13 DIAGNOSIS — Z5111 Encounter for antineoplastic chemotherapy: Secondary | ICD-10-CM | POA: Diagnosis not present

## 2021-04-13 LAB — CBC WITH DIFFERENTIAL (CANCER CENTER ONLY)
Abs Immature Granulocytes: 0.03 10*3/uL (ref 0.00–0.07)
Basophils Absolute: 0 10*3/uL (ref 0.0–0.1)
Basophils Relative: 1 %
Eosinophils Absolute: 0.1 10*3/uL (ref 0.0–0.5)
Eosinophils Relative: 1 %
HCT: 33 % — ABNORMAL LOW (ref 36.0–46.0)
Hemoglobin: 11.2 g/dL — ABNORMAL LOW (ref 12.0–15.0)
Immature Granulocytes: 1 %
Lymphocytes Relative: 7 %
Lymphs Abs: 0.3 10*3/uL — ABNORMAL LOW (ref 0.7–4.0)
MCH: 32.1 pg (ref 26.0–34.0)
MCHC: 33.9 g/dL (ref 30.0–36.0)
MCV: 94.6 fL (ref 80.0–100.0)
Monocytes Absolute: 0.5 10*3/uL (ref 0.1–1.0)
Monocytes Relative: 13 %
Neutro Abs: 3 10*3/uL (ref 1.7–7.7)
Neutrophils Relative %: 77 %
Platelet Count: 349 10*3/uL (ref 150–400)
RBC: 3.49 MIL/uL — ABNORMAL LOW (ref 3.87–5.11)
RDW: 15.4 % (ref 11.5–15.5)
WBC Count: 3.9 10*3/uL — ABNORMAL LOW (ref 4.0–10.5)
nRBC: 0 % (ref 0.0–0.2)

## 2021-04-13 LAB — CMP (CANCER CENTER ONLY)
ALT: <6 U/L (ref 0–44)
AST: 15 U/L (ref 15–41)
Albumin: 2.4 g/dL — ABNORMAL LOW (ref 3.5–5.0)
Alkaline Phosphatase: 72 U/L (ref 38–126)
Anion gap: 11 (ref 5–15)
BUN: 14 mg/dL (ref 8–23)
CO2: 29 mmol/L (ref 22–32)
Calcium: 8.6 mg/dL — ABNORMAL LOW (ref 8.9–10.3)
Chloride: 94 mmol/L — ABNORMAL LOW (ref 98–111)
Creatinine: 0.78 mg/dL (ref 0.44–1.00)
GFR, Estimated: >60 mL/min (ref >60)
Glucose, Bld: 103 mg/dL — ABNORMAL HIGH (ref 70–99)
Potassium: 3.1 mmol/L — ABNORMAL LOW (ref 3.5–5.1)
Sodium: 134 mmol/L — ABNORMAL LOW (ref 135–145)
Total Bilirubin: 0.4 mg/dL (ref 0.3–1.2)
Total Protein: 5 g/dL — ABNORMAL LOW (ref 6.5–8.1)

## 2021-04-13 LAB — LACTATE DEHYDROGENASE: LDH: 253 U/L — ABNORMAL HIGH (ref 98–192)

## 2021-04-13 MED ORDER — ACETAMINOPHEN 325 MG PO TABS
ORAL_TABLET | ORAL | Status: AC
  Filled 2021-04-13: qty 2

## 2021-04-13 MED ORDER — DEXAMETHASONE 4 MG PO TABS
ORAL_TABLET | ORAL | Status: AC
  Filled 2021-04-13: qty 3

## 2021-04-13 MED ORDER — SODIUM CHLORIDE 0.9 % IV SOLN
Freq: Once | INTRAVENOUS | Status: AC
  Filled 2021-04-13: qty 250

## 2021-04-13 MED ORDER — ACETAMINOPHEN 325 MG PO TABS
650.0000 mg | ORAL_TABLET | Freq: Once | ORAL | Status: AC
  Administered 2021-04-13: 650 mg via ORAL

## 2021-04-13 MED ORDER — DARATUMUMAB-HYALURONIDASE-FIHJ 1800-30000 MG-UT/15ML ~~LOC~~ SOLN
1800.0000 mg | Freq: Once | SUBCUTANEOUS | Status: AC
  Administered 2021-04-13: 1800 mg via SUBCUTANEOUS
  Filled 2021-04-13: qty 15

## 2021-04-13 MED ORDER — SODIUM CHLORIDE 0.9 % IV SOLN
300.0000 mg/m2 | Freq: Once | INTRAVENOUS | Status: AC
  Administered 2021-04-13: 480 mg via INTRAVENOUS
  Filled 2021-04-13: qty 24

## 2021-04-13 MED ORDER — DEXAMETHASONE 4 MG PO TABS
10.0000 mg | ORAL_TABLET | Freq: Once | ORAL | Status: AC
  Administered 2021-04-13: 10 mg via ORAL

## 2021-04-13 NOTE — Patient Instructions (Addendum)
[place] Discharge Instructions for Patients Receiving Chemotherapy  Today you received the following immunotherapy agent: Darzalex FASPRO and chemotherapy agents: Cytoxan  To help prevent nausea and vomiting after your treatment, we encourage you to take your nausea medication as directed by your MD.   If you develop nausea and vomiting that is not controlled by your nausea medication, call the clinic.   BELOW ARE SYMPTOMS THAT SHOULD BE REPORTED IMMEDIATELY:  *FEVER GREATER THAN 100.5 F  *CHILLS WITH OR WITHOUT FEVER  NAUSEA AND VOMITING THAT IS NOT CONTROLLED WITH YOUR NAUSEA MEDICATION  *UNUSUAL SHORTNESS OF BREATH  *UNUSUAL BRUISING OR BLEEDING  TENDERNESS IN MOUTH AND THROAT WITH OR WITHOUT PRESENCE OF ULCERS  *URINARY PROBLEMS  *BOWEL PROBLEMS  UNUSUAL RASH Items with * indicate a potential emergency and should be followed up as soon as possible.  Feel free to call the clinic should you have any questions or concerns. The clinic phone number is [phone].  Please show the [place] at check-in to the Emergency Department and triage nurse.

## 2021-04-13 NOTE — Progress Notes (Signed)
Pt. stable for discharge. Declines to stay for any post observation.

## 2021-04-13 NOTE — Progress Notes (Signed)
Holding Velcade today. Deleted Velcade orders from plan for today per MD.  Acquanetta Belling, RPH, BCPS, BCOP 04/13/2021 9:45 AM

## 2021-04-18 ENCOUNTER — Inpatient Hospital Stay: Payer: Medicare Other | Admitting: Primary Care

## 2021-04-19 ENCOUNTER — Telehealth: Payer: Self-pay | Admitting: Hematology and Oncology

## 2021-04-19 NOTE — Telephone Encounter (Signed)
Called and spoke with patient to inform of change in provider for 4/28 appt. Confirmed new time

## 2021-04-20 ENCOUNTER — Inpatient Hospital Stay (HOSPITAL_BASED_OUTPATIENT_CLINIC_OR_DEPARTMENT_OTHER): Payer: Medicare Other | Admitting: Physician Assistant

## 2021-04-20 ENCOUNTER — Other Ambulatory Visit: Payer: Self-pay | Admitting: Hematology

## 2021-04-20 ENCOUNTER — Inpatient Hospital Stay: Payer: Medicare Other

## 2021-04-20 ENCOUNTER — Other Ambulatory Visit: Payer: Self-pay

## 2021-04-20 ENCOUNTER — Inpatient Hospital Stay: Payer: Medicare Other | Admitting: Hematology and Oncology

## 2021-04-20 VITALS — BP 139/69 | HR 89 | Temp 97.5°F | Resp 18 | Ht 62.0 in | Wt 132.9 lb

## 2021-04-20 DIAGNOSIS — E8581 Light chain (AL) amyloidosis: Secondary | ICD-10-CM

## 2021-04-20 DIAGNOSIS — Z5111 Encounter for antineoplastic chemotherapy: Secondary | ICD-10-CM | POA: Diagnosis not present

## 2021-04-20 LAB — CMP (CANCER CENTER ONLY)
ALT: 7 U/L (ref 0–44)
AST: 15 U/L (ref 15–41)
Albumin: 2.5 g/dL — ABNORMAL LOW (ref 3.5–5.0)
Alkaline Phosphatase: 67 U/L (ref 38–126)
Anion gap: 12 (ref 5–15)
BUN: 13 mg/dL (ref 8–23)
CO2: 31 mmol/L (ref 22–32)
Calcium: 8.8 mg/dL — ABNORMAL LOW (ref 8.9–10.3)
Chloride: 91 mmol/L — ABNORMAL LOW (ref 98–111)
Creatinine: 0.73 mg/dL (ref 0.44–1.00)
GFR, Estimated: >60 mL/min (ref >60)
Glucose, Bld: 93 mg/dL (ref 70–99)
Potassium: 3.3 mmol/L — ABNORMAL LOW (ref 3.5–5.1)
Sodium: 134 mmol/L — ABNORMAL LOW (ref 135–145)
Total Bilirubin: 0.6 mg/dL (ref 0.3–1.2)
Total Protein: 5 g/dL — ABNORMAL LOW (ref 6.5–8.1)

## 2021-04-20 LAB — CBC WITH DIFFERENTIAL (CANCER CENTER ONLY)
Abs Immature Granulocytes: 0.02 10*3/uL (ref 0.00–0.07)
Basophils Absolute: 0 10*3/uL (ref 0.0–0.1)
Basophils Relative: 1 %
Eosinophils Absolute: 0 10*3/uL (ref 0.0–0.5)
Eosinophils Relative: 1 %
HCT: 30.1 % — ABNORMAL LOW (ref 36.0–46.0)
Hemoglobin: 10.5 g/dL — ABNORMAL LOW (ref 12.0–15.0)
Immature Granulocytes: 1 %
Lymphocytes Relative: 8 %
Lymphs Abs: 0.3 10*3/uL — ABNORMAL LOW (ref 0.7–4.0)
MCH: 32.8 pg (ref 26.0–34.0)
MCHC: 34.9 g/dL (ref 30.0–36.0)
MCV: 94.1 fL (ref 80.0–100.0)
Monocytes Absolute: 0.4 10*3/uL (ref 0.1–1.0)
Monocytes Relative: 10 %
Neutro Abs: 3.1 10*3/uL (ref 1.7–7.7)
Neutrophils Relative %: 79 %
Platelet Count: 293 10*3/uL (ref 150–400)
RBC: 3.2 MIL/uL — ABNORMAL LOW (ref 3.87–5.11)
RDW: 15.7 % — ABNORMAL HIGH (ref 11.5–15.5)
WBC Count: 3.8 10*3/uL — ABNORMAL LOW (ref 4.0–10.5)
nRBC: 0 % (ref 0.0–0.2)

## 2021-04-20 LAB — LACTATE DEHYDROGENASE: LDH: 242 U/L — ABNORMAL HIGH (ref 98–192)

## 2021-04-20 MED ORDER — DEXAMETHASONE 4 MG PO TABS
10.0000 mg | ORAL_TABLET | Freq: Once | ORAL | Status: AC
  Administered 2021-04-20: 10 mg via ORAL

## 2021-04-20 MED ORDER — DEXAMETHASONE 4 MG PO TABS
ORAL_TABLET | ORAL | Status: AC
  Filled 2021-04-20: qty 10

## 2021-04-20 MED ORDER — SODIUM CHLORIDE 0.9 % IV SOLN
INTRAVENOUS | Status: DC
  Filled 2021-04-20: qty 250

## 2021-04-20 MED ORDER — SODIUM CHLORIDE 0.9 % IV SOLN
300.0000 mg/m2 | Freq: Once | INTRAVENOUS | Status: AC
  Administered 2021-04-20: 480 mg via INTRAVENOUS
  Filled 2021-04-20: qty 24

## 2021-04-20 MED ORDER — POTASSIUM CHLORIDE CRYS ER 20 MEQ PO TBCR: 20.0000 meq | EXTENDED_RELEASE_TABLET | Freq: Every day | ORAL | 0 refills | Status: DC

## 2021-04-20 NOTE — Patient Instructions (Signed)
[facility]   ?Discharge Instructions: ?Thank you for choosing [facility] to provide your oncology and hematology care.  ? ?If you have a lab appointment with the Cancer Center, please go directly to the Cancer Center and check in at the registration area. ?  ?Wear comfortable clothing and clothing appropriate for easy access to any Portacath or PICC line.  ? ?We strive to give you quality time with your provider. You may need to reschedule your appointment if you arrive late (15 or more minutes).  Arriving late affects you and other patients whose appointments are after yours.  Also, if you miss three or more appointments without notifying the office, you may be dismissed from the clinic at the provider?s discretion.    ?  ?For prescription refill requests, have your pharmacy contact our office and allow 72 hours for refills to be completed.   ? ?Today you received the following chemotherapy and/or immunotherapy agents: cyclophosphamide    ?  ?To help prevent nausea and vomiting after your treatment, we encourage you to take your nausea medication as directed. ? ?BELOW ARE SYMPTOMS THAT SHOULD BE REPORTED IMMEDIATELY: ?*FEVER GREATER THAN 100.4 F (38 ?C) OR HIGHER ?*CHILLS OR SWEATING ?*NAUSEA AND VOMITING THAT IS NOT CONTROLLED WITH YOUR NAUSEA MEDICATION ?*UNUSUAL SHORTNESS OF BREATH ?*UNUSUAL BRUISING OR BLEEDING ?*URINARY PROBLEMS (pain or burning when urinating, or frequent urination) ?*BOWEL PROBLEMS (unusual diarrhea, constipation, pain near the anus) ?TENDERNESS IN MOUTH AND THROAT WITH OR WITHOUT PRESENCE OF ULCERS (sore throat, sores in mouth, or a toothache) ?UNUSUAL RASH, SWELLING OR PAIN  ?UNUSUAL VAGINAL DISCHARGE OR ITCHING  ? ?Items with * indicate a potential emergency and should be followed up as soon as possible or go to the Emergency Department if any problems should occur. ? ?Please show the CHEMOTHERAPY ALERT CARD or IMMUNOTHERAPY ALERT CARD at  check-in to the Emergency Department and triage nurse. ? ?Should you have questions after your visit or need to cancel or reschedule your appointment, please contact [facility]  Dept: [phone]  and follow the prompts.  Office hours are 8:00 a.m. to 4:30 p.m. Monday - Friday. Please note that voicemails left after 4:00 p.m. may not be returned until the following business day.  We are closed weekends and major holidays. You have access to a nurse at all times for urgent questions. Please call the main number to the clinic Dept: [phone] and follow the prompts. ? ? ?For any non-urgent questions, you may also contact your provider using MyChart. We now offer e-Visits for anyone 18 and older to request care online for non-urgent symptoms. For details visit [email or web address]. ?  ?Also download the MyChart app! Go to the app store, search "MyChart", open the app, select , and log in with your MyChart username and password. ? ?Due to Covid, a mask is required upon entering the hospital/clinic. If you do not have a mask, one will be given to you upon arrival. For doctor visits, patients may have 1 support person aged 18 or older with them. For treatment visits, patients cannot have anyone with them due to current Covid guidelines and our immunocompromised population.  ? ?
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
## 2021-04-24 ENCOUNTER — Ambulatory Visit (INDEPENDENT_AMBULATORY_CARE_PROVIDER_SITE_OTHER): Payer: Medicare Other | Admitting: Physician Assistant

## 2021-04-24 ENCOUNTER — Encounter: Payer: Self-pay | Admitting: Physician Assistant

## 2021-04-24 ENCOUNTER — Ambulatory Visit (HOSPITAL_COMMUNITY)
Admission: RE | Admit: 2021-04-24 | Discharge: 2021-04-24 | Disposition: A | Discharge status: Home / Self Care | Payer: Medicare Other | Source: Ambulatory Visit | Attending: Surgery | Admitting: Surgery

## 2021-04-24 ENCOUNTER — Other Ambulatory Visit: Payer: Self-pay

## 2021-04-24 VITALS — BP 145/77 | HR 73 | Temp 98.3°F | Resp 20 | Ht 62.0 in | Wt 128.3 lb

## 2021-04-24 DIAGNOSIS — I824Z3 Acute embolism and thrombosis of unspecified deep veins of distal lower extremity, bilateral: Secondary | ICD-10-CM | POA: Insufficient documentation
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
## 2021-04-27 ENCOUNTER — Ambulatory Visit: Payer: Medicare Other | Admitting: Nutrition

## 2021-04-27 ENCOUNTER — Other Ambulatory Visit: Payer: Self-pay

## 2021-04-27 ENCOUNTER — Other Ambulatory Visit: Payer: Self-pay | Admitting: Hematology and Oncology

## 2021-04-27 ENCOUNTER — Inpatient Hospital Stay: Payer: Medicare Other | Attending: Hematology and Oncology

## 2021-04-27 ENCOUNTER — Inpatient Hospital Stay: Payer: Medicare Other

## 2021-04-27 VITALS — BP 122/69 | HR 69 | Temp 97.7°F | Resp 18

## 2021-04-27 DIAGNOSIS — E8581 Light chain (AL) amyloidosis: Secondary | ICD-10-CM | POA: Insufficient documentation

## 2021-04-27 DIAGNOSIS — Z79899 Other long term (current) drug therapy: Secondary | ICD-10-CM | POA: Diagnosis not present

## 2021-04-27 DIAGNOSIS — Z5112 Encounter for antineoplastic immunotherapy: Secondary | ICD-10-CM | POA: Insufficient documentation

## 2021-04-27 DIAGNOSIS — I824Z3 Acute embolism and thrombosis of unspecified deep veins of distal lower extremity, bilateral: Secondary | ICD-10-CM

## 2021-04-27 DIAGNOSIS — Z5111 Encounter for antineoplastic chemotherapy: Secondary | ICD-10-CM | POA: Diagnosis present

## 2021-04-27 LAB — CMP (CANCER CENTER ONLY)
ALT: <6 U/L (ref 0–44)
AST: 15 U/L (ref 15–41)
Albumin: 2.4 g/dL — ABNORMAL LOW (ref 3.5–5.0)
Alkaline Phosphatase: 65 U/L (ref 38–126)
Anion gap: 9 (ref 5–15)
BUN: 13 mg/dL (ref 8–23)
CO2: 31 mmol/L (ref 22–32)
Calcium: 8.7 mg/dL — ABNORMAL LOW (ref 8.9–10.3)
Chloride: 93 mmol/L — ABNORMAL LOW (ref 98–111)
Creatinine: 0.75 mg/dL (ref 0.44–1.00)
GFR, Estimated: >60 mL/min (ref >60)
Glucose, Bld: 122 mg/dL — ABNORMAL HIGH (ref 70–99)
Potassium: 2.8 mmol/L — ABNORMAL LOW (ref 3.5–5.1)
Sodium: 133 mmol/L — ABNORMAL LOW (ref 135–145)
Total Bilirubin: 0.4 mg/dL (ref 0.3–1.2)
Total Protein: 5 g/dL — ABNORMAL LOW (ref 6.5–8.1)

## 2021-04-27 LAB — LACTATE DEHYDROGENASE: LDH: 221 U/L — ABNORMAL HIGH (ref 98–192)

## 2021-04-27 LAB — CBC WITH DIFFERENTIAL (CANCER CENTER ONLY)
Abs Immature Granulocytes: 0.03 10*3/uL (ref 0.00–0.07)
Basophils Absolute: 0 10*3/uL (ref 0.0–0.1)
Basophils Relative: 1 %
Eosinophils Absolute: 0 10*3/uL (ref 0.0–0.5)
Eosinophils Relative: 1 %
HCT: 30.3 % — ABNORMAL LOW (ref 36.0–46.0)
Hemoglobin: 10.5 g/dL — ABNORMAL LOW (ref 12.0–15.0)
Immature Granulocytes: 1 %
Lymphocytes Relative: 10 %
Lymphs Abs: 0.3 10*3/uL — ABNORMAL LOW (ref 0.7–4.0)
MCH: 32.8 pg (ref 26.0–34.0)
MCHC: 34.7 g/dL (ref 30.0–36.0)
MCV: 94.7 fL (ref 80.0–100.0)
Monocytes Absolute: 0.4 10*3/uL (ref 0.1–1.0)
Monocytes Relative: 13 %
Neutro Abs: 2.3 10*3/uL (ref 1.7–7.7)
Neutrophils Relative %: 74 %
Platelet Count: 282 10*3/uL (ref 150–400)
RBC: 3.2 MIL/uL — ABNORMAL LOW (ref 3.87–5.11)
RDW: 15.8 % — ABNORMAL HIGH (ref 11.5–15.5)
WBC Count: 3.1 10*3/uL — ABNORMAL LOW (ref 4.0–10.5)
nRBC: 0 % (ref 0.0–0.2)

## 2021-04-27 MED ORDER — DEXAMETHASONE 4 MG PO TABS
10.0000 mg | ORAL_TABLET | Freq: Once | ORAL | Status: AC
  Administered 2021-04-27: 10 mg via ORAL

## 2021-04-27 MED ORDER — DARATUMUMAB-HYALURONIDASE-FIHJ 1800-30000 MG-UT/15ML ~~LOC~~ SOLN
1800.0000 mg | Freq: Once | SUBCUTANEOUS | Status: AC
  Administered 2021-04-27: 1800 mg via SUBCUTANEOUS
  Filled 2021-04-27: qty 15

## 2021-04-27 MED ORDER — ACETAMINOPHEN 325 MG PO TABS
ORAL_TABLET | ORAL | Status: AC
  Filled 2021-04-27: qty 2

## 2021-04-27 MED ORDER — SODIUM CHLORIDE 0.9 % IV SOLN
300.0000 mg/m2 | Freq: Once | INTRAVENOUS | Status: AC
  Administered 2021-04-27: 480 mg via INTRAVENOUS
  Filled 2021-04-27: qty 24

## 2021-04-27 MED ORDER — ACETAMINOPHEN 325 MG PO TABS
650.0000 mg | ORAL_TABLET | Freq: Once | ORAL | Status: AC
  Administered 2021-04-27: 650 mg via ORAL

## 2021-04-27 MED ORDER — SODIUM CHLORIDE 0.9 % IV SOLN
Freq: Once | INTRAVENOUS | Status: AC
  Filled 2021-04-27: qty 250

## 2021-04-27 MED ORDER — DEXAMETHASONE 4 MG PO TABS
ORAL_TABLET | ORAL | Status: AC
  Filled 2021-04-27: qty 3

## 2021-04-27 NOTE — Progress Notes (Signed)
RN requested RD speak with patient regarding high potassium foods.  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 Weight is documented as 128.3 pounds May 2 decreased from 143.8 pounds March 31. Labs noted: Sodium 133, potassium 2.8,Glucose 122, albumin 2.4. Patient reports she is a vegetarian.  She has notable edema. She complains of early satiety and increased gas. She wonders what foods are high in potassium.  Nutrition diagnosis: Unintended weight loss related to inadequate oral intake as evidenced by 11% weight loss in less than 6 weeks.  Intervention: Reviewed vegetarian sources of protein. Educated patient on sources of potassium. Encourage small frequent meals and snacks. Educated on strategies for improving increased gas. Provided patient with oral nutrition supplement samples as well as nutrition facts sheets.  Questions were answered.  Teach back method used.  Contact information given.  Monitoring, evaluation, goals: Patient will tolerate increased calories and protein to minimize weight loss and improve edema.  Next visit: Patient will contact our team with questions.  **Disclaimer: This note was dictated with voice recognition software. Similar sounding words can inadvertently be transcribed and this note may contain transcription errors which may not have been corrected upon publication of note.**

## 2021-04-27 NOTE — Patient Instructions (Signed)
[facility]  Discharge Instructions: Thank you for choosing [facility] to provide your oncology and hematology care.   If you have a lab appointment with the [facility], please go directly to the [facility] and check in at the registration area.   Wear comfortable clothing and clothing appropriate for easy access to any Portacath or PICC line.   We strive to give you quality time with your provider. You may need to reschedule your appointment if you arrive late (15 or more minutes).  Arriving late affects you and other patients whose appointments are after yours.  Also, if you miss three or more appointments without notifying the office, you may be dismissed from the clinic at the provider's discretion.      For prescription refill requests, have your pharmacy contact our office and allow 72 hours for refills to be completed.    Today you received the following chemotherapy and/or immunotherapy agents cytoxan, darzalex faspro      To help prevent nausea and vomiting after your treatment, we encourage you to take your nausea medication as directed.  BELOW ARE SYMPTOMS THAT SHOULD BE REPORTED IMMEDIATELY: . *FEVER GREATER THAN 100.4 F (38 C) OR HIGHER . *CHILLS OR SWEATING . *NAUSEA AND VOMITING THAT IS NOT CONTROLLED WITH YOUR NAUSEA MEDICATION . *UNUSUAL SHORTNESS OF BREATH . *UNUSUAL BRUISING OR BLEEDING . *URINARY PROBLEMS (pain or burning when urinating, or frequent urination) . *BOWEL PROBLEMS (unusual diarrhea, constipation, pain near the anus) . TENDERNESS IN MOUTH AND THROAT WITH OR WITHOUT PRESENCE OF ULCERS (sore throat, sores in mouth, or a toothache) . UNUSUAL RASH, SWELLING OR PAIN  . UNUSUAL VAGINAL DISCHARGE OR ITCHING   Items with * indicate a potential emergency and should be followed up as soon as possible or go to the Emergency Department if any problems should occur.  Please show the CHEMOTHERAPY ALERT CARD or  IMMUNOTHERAPY ALERT CARD at check-in to the Emergency Department and triage nurse.  Should you have questions after your visit or need to cancel or reschedule your appointment, please contact [facility]  Dept: [phone]  and follow the prompts.  Office hours are 8:00 a.m. to 4:30 p.m. Monday - Friday. Please note that voicemails left after 4:00 p.m. may not be returned until the following business day.  We are closed weekends and major holidays. You have access to a nurse at all times for urgent questions. Please call the main number to the clinic Dept: [phone] and follow the prompts.   For any non-urgent questions, you may also contact your provider using MyChart. We now offer e-Visits for anyone 3 and older to request care online for non-urgent symptoms. For details visit [email or web address].   Also download the MyChart app! Go to the app store, search "MyChart", open the app, select [facility], and log in with your MyChart username and password.  Due to Covid, a mask is required upon entering the hospital/clinic. If you do not have a mask, one will be given to you upon arrival. For doctor visits, patients may have 1 support person aged 77 or older with them. For treatment visits, patients cannot have anyone with them due to current Covid guidelines and our immunocompromised population.

## 2021-04-27 NOTE — Progress Notes (Signed)
Pt instructed to pick up Rx for potassium at her pharmacy today. Pt verbally agreed.

## 2021-04-28 LAB — KAPPA/LAMBDA LIGHT CHAINS
Kappa free light chain: 8.3 mg/L (ref 3.3–19.4)
Kappa, lambda light chain ratio: 0.09 — ABNORMAL LOW (ref 0.26–1.65)
Lambda free light chains: 92.5 mg/L — ABNORMAL HIGH (ref 5.7–26.3)

## 2021-04-30 ENCOUNTER — Emergency Department (HOSPITAL_COMMUNITY): Payer: Medicare Other

## 2021-04-30 ENCOUNTER — Inpatient Hospital Stay (HOSPITAL_COMMUNITY)
Admission: EM | Admit: 2021-04-30 | Discharge: 2021-05-01 | DRG: 189 | Disposition: A | Discharge status: Home Health | Payer: Medicare Other | Attending: Internal Medicine | Admitting: Internal Medicine

## 2021-04-30 ENCOUNTER — Other Ambulatory Visit: Payer: Self-pay

## 2021-04-30 ENCOUNTER — Encounter (HOSPITAL_COMMUNITY): Payer: Self-pay

## 2021-04-30 ENCOUNTER — Other Ambulatory Visit: Payer: Self-pay | Admitting: Hematology and Oncology

## 2021-04-30 ENCOUNTER — Observation Stay (HOSPITAL_COMMUNITY): Payer: Medicare Other

## 2021-04-30 DIAGNOSIS — I161 Hypertensive emergency: Secondary | ICD-10-CM | POA: Diagnosis present

## 2021-04-30 DIAGNOSIS — R159 Full incontinence of feces: Secondary | ICD-10-CM | POA: Diagnosis present

## 2021-04-30 DIAGNOSIS — I5021 Acute systolic (congestive) heart failure: Secondary | ICD-10-CM

## 2021-04-30 DIAGNOSIS — Z888 Allergy status to other drugs, medicaments and biological substances status: Secondary | ICD-10-CM

## 2021-04-30 DIAGNOSIS — Z83438 Family history of other disorder of lipoprotein metabolism and other lipidemia: Secondary | ICD-10-CM

## 2021-04-30 DIAGNOSIS — Z882 Allergy status to sulfonamides status: Secondary | ICD-10-CM

## 2021-04-30 DIAGNOSIS — E8770 Fluid overload, unspecified: Secondary | ICD-10-CM | POA: Diagnosis not present

## 2021-04-30 DIAGNOSIS — Z885 Allergy status to narcotic agent status: Secondary | ICD-10-CM

## 2021-04-30 DIAGNOSIS — F39 Unspecified mood [affective] disorder: Secondary | ICD-10-CM | POA: Diagnosis present

## 2021-04-30 DIAGNOSIS — R0603 Acute respiratory distress: Secondary | ICD-10-CM | POA: Diagnosis present

## 2021-04-30 DIAGNOSIS — E875 Hyperkalemia: Secondary | ICD-10-CM | POA: Diagnosis present

## 2021-04-30 DIAGNOSIS — C9000 Multiple myeloma not having achieved remission: Secondary | ICD-10-CM | POA: Diagnosis present

## 2021-04-30 DIAGNOSIS — R152 Fecal urgency: Secondary | ICD-10-CM | POA: Diagnosis present

## 2021-04-30 DIAGNOSIS — Z20822 Contact with and (suspected) exposure to covid-19: Secondary | ICD-10-CM | POA: Diagnosis present

## 2021-04-30 DIAGNOSIS — J810 Acute pulmonary edema: Principal | ICD-10-CM | POA: Diagnosis present

## 2021-04-30 DIAGNOSIS — I10 Essential (primary) hypertension: Secondary | ICD-10-CM | POA: Diagnosis present

## 2021-04-30 DIAGNOSIS — E785 Hyperlipidemia, unspecified: Secondary | ICD-10-CM | POA: Diagnosis present

## 2021-04-30 DIAGNOSIS — Z801 Family history of malignant neoplasm of trachea, bronchus and lung: Secondary | ICD-10-CM

## 2021-04-30 DIAGNOSIS — Z86718 Personal history of other venous thrombosis and embolism: Secondary | ICD-10-CM

## 2021-04-30 DIAGNOSIS — Z79899 Other long term (current) drug therapy: Secondary | ICD-10-CM

## 2021-04-30 DIAGNOSIS — Z9221 Personal history of antineoplastic chemotherapy: Secondary | ICD-10-CM

## 2021-04-30 DIAGNOSIS — Z66 Do not resuscitate: Secondary | ICD-10-CM | POA: Diagnosis present

## 2021-04-30 DIAGNOSIS — Z811 Family history of alcohol abuse and dependence: Secondary | ICD-10-CM

## 2021-04-30 DIAGNOSIS — Z7901 Long term (current) use of anticoagulants: Secondary | ICD-10-CM

## 2021-04-30 DIAGNOSIS — Z8249 Family history of ischemic heart disease and other diseases of the circulatory system: Secondary | ICD-10-CM

## 2021-04-30 DIAGNOSIS — Z87891 Personal history of nicotine dependence: Secondary | ICD-10-CM

## 2021-04-30 DIAGNOSIS — Z95828 Presence of other vascular implants and grafts: Secondary | ICD-10-CM

## 2021-04-30 DIAGNOSIS — R195 Other fecal abnormalities: Secondary | ICD-10-CM | POA: Diagnosis present

## 2021-04-30 DIAGNOSIS — Z880 Allergy status to penicillin: Secondary | ICD-10-CM

## 2021-04-30 HISTORY — DX: Do not resuscitate: Z66

## 2021-04-30 HISTORY — DX: Multiple myeloma not having achieved remission: C90.00

## 2021-04-30 LAB — COMPREHENSIVE METABOLIC PANEL
ALT: 13 U/L (ref 0–44)
AST: 49 U/L — ABNORMAL HIGH (ref 15–41)
Albumin: 2.6 g/dL — ABNORMAL LOW (ref 3.5–5.0)
Alkaline Phosphatase: 69 U/L (ref 38–126)
Anion gap: 10 (ref 5–15)
BUN: 16 mg/dL (ref 8–23)
CO2: 27 mmol/L (ref 22–32)
Calcium: 8.5 mg/dL — ABNORMAL LOW (ref 8.9–10.3)
Chloride: 92 mmol/L — ABNORMAL LOW (ref 98–111)
Creatinine, Ser: 0.99 mg/dL (ref 0.44–1.00)
GFR, Estimated: 59 mL/min — ABNORMAL LOW (ref >60)
Glucose, Bld: 212 mg/dL — ABNORMAL HIGH (ref 70–99)
Potassium: 5.8 mmol/L — ABNORMAL HIGH (ref 3.5–5.1)
Sodium: 129 mmol/L — ABNORMAL LOW (ref 135–145)
Total Bilirubin: 2.1 mg/dL — ABNORMAL HIGH (ref 0.3–1.2)
Total Protein: 4.9 g/dL — ABNORMAL LOW (ref 6.5–8.1)

## 2021-04-30 LAB — CBC WITH DIFFERENTIAL/PLATELET
Abs Immature Granulocytes: 0.02 10*3/uL (ref 0.00–0.07)
Basophils Absolute: 0 10*3/uL (ref 0.0–0.1)
Basophils Relative: 1 %
Eosinophils Absolute: 0.1 10*3/uL (ref 0.0–0.5)
Eosinophils Relative: 2 %
HCT: 36.5 % (ref 36.0–46.0)
Hemoglobin: 12.2 g/dL (ref 12.0–15.0)
Immature Granulocytes: 1 %
Lymphocytes Relative: 13 %
Lymphs Abs: 0.4 10*3/uL — ABNORMAL LOW (ref 0.7–4.0)
MCH: 33.5 pg (ref 26.0–34.0)
MCHC: 33.4 g/dL (ref 30.0–36.0)
MCV: 100.3 fL — ABNORMAL HIGH (ref 80.0–100.0)
Monocytes Absolute: 0.4 10*3/uL (ref 0.1–1.0)
Monocytes Relative: 15 %
Neutro Abs: 2.1 10*3/uL (ref 1.7–7.7)
Neutrophils Relative %: 68 %
Platelets: 372 10*3/uL (ref 150–400)
RBC: 3.64 MIL/uL — ABNORMAL LOW (ref 3.87–5.11)
RDW: 16.4 % — ABNORMAL HIGH (ref 11.5–15.5)
WBC: 3 10*3/uL — ABNORMAL LOW (ref 4.0–10.5)
nRBC: 0 % (ref 0.0–0.2)

## 2021-04-30 LAB — I-STAT ARTERIAL BLOOD GAS, ED
Acid-Base Excess: 2 mmol/L (ref 0.0–2.0)
Bicarbonate: 31 mmol/L — ABNORMAL HIGH (ref 20.0–28.0)
Calcium, Ion: 1.22 mmol/L (ref 1.15–1.40)
HCT: 35 % — ABNORMAL LOW (ref 36.0–46.0)
Hemoglobin: 11.9 g/dL — ABNORMAL LOW (ref 12.0–15.0)
O2 Saturation: 100 %
Potassium: 3.7 mmol/L (ref 3.5–5.1)
Sodium: 130 mmol/L — ABNORMAL LOW (ref 135–145)
TCO2: 33 mmol/L — ABNORMAL HIGH (ref 22–32)
pCO2 arterial: 74 mmHg (ref 32.0–48.0)
pH, Arterial: 7.231 — ABNORMAL LOW (ref 7.350–7.450)
pO2, Arterial: 279 mmHg — ABNORMAL HIGH (ref 83.0–108.0)

## 2021-04-30 LAB — CBC
HCT: 30.8 % — ABNORMAL LOW (ref 36.0–46.0)
Hemoglobin: 10.8 g/dL — ABNORMAL LOW (ref 12.0–15.0)
MCH: 33.2 pg (ref 26.0–34.0)
MCHC: 35.1 g/dL (ref 30.0–36.0)
MCV: 94.8 fL (ref 80.0–100.0)
Platelets: 287 10*3/uL (ref 150–400)
RBC: 3.25 MIL/uL — ABNORMAL LOW (ref 3.87–5.11)
RDW: 15.8 % — ABNORMAL HIGH (ref 11.5–15.5)
WBC: 3.1 10*3/uL — ABNORMAL LOW (ref 4.0–10.5)
nRBC: 0 % (ref 0.0–0.2)

## 2021-04-30 LAB — BRAIN NATRIURETIC PEPTIDE: B Natriuretic Peptide: 1506.2 pg/mL — ABNORMAL HIGH (ref 0.0–100.0)

## 2021-04-30 LAB — MAGNESIUM: Magnesium: 2 mg/dL (ref 1.7–2.4)

## 2021-04-30 LAB — LACTIC ACID, PLASMA
Lactic Acid, Venous: 2.3 mmol/L (ref 0.5–1.9)
Lactic Acid, Venous: 3 mmol/L (ref 0.5–1.9)

## 2021-04-30 LAB — ECHOCARDIOGRAM COMPLETE
Area-P 1/2: 5.09 cm2
S' Lateral: 2.1 cm

## 2021-04-30 LAB — TROPONIN I (HIGH SENSITIVITY): Troponin I (High Sensitivity): 48 ng/L — ABNORMAL HIGH (ref <18)

## 2021-04-30 LAB — RESP PANEL BY RT-PCR (FLU A&B, COVID) ARPGX2
Influenza A by PCR: NEGATIVE
Influenza B by PCR: NEGATIVE
SARS Coronavirus 2 by RT PCR: NEGATIVE

## 2021-04-30 LAB — POC OCCULT BLOOD, ED: Fecal Occult Bld: POSITIVE — AB

## 2021-04-30 IMAGING — DX DG CHEST 1V PORT
1 series · 1 of 1 positions shown · non-contrast
Comparison: [DATE] chest radiograph.

CLINICAL DATA: Dyspnea

EXAM:
PORTABLE CHEST 1 VIEW

[chest]
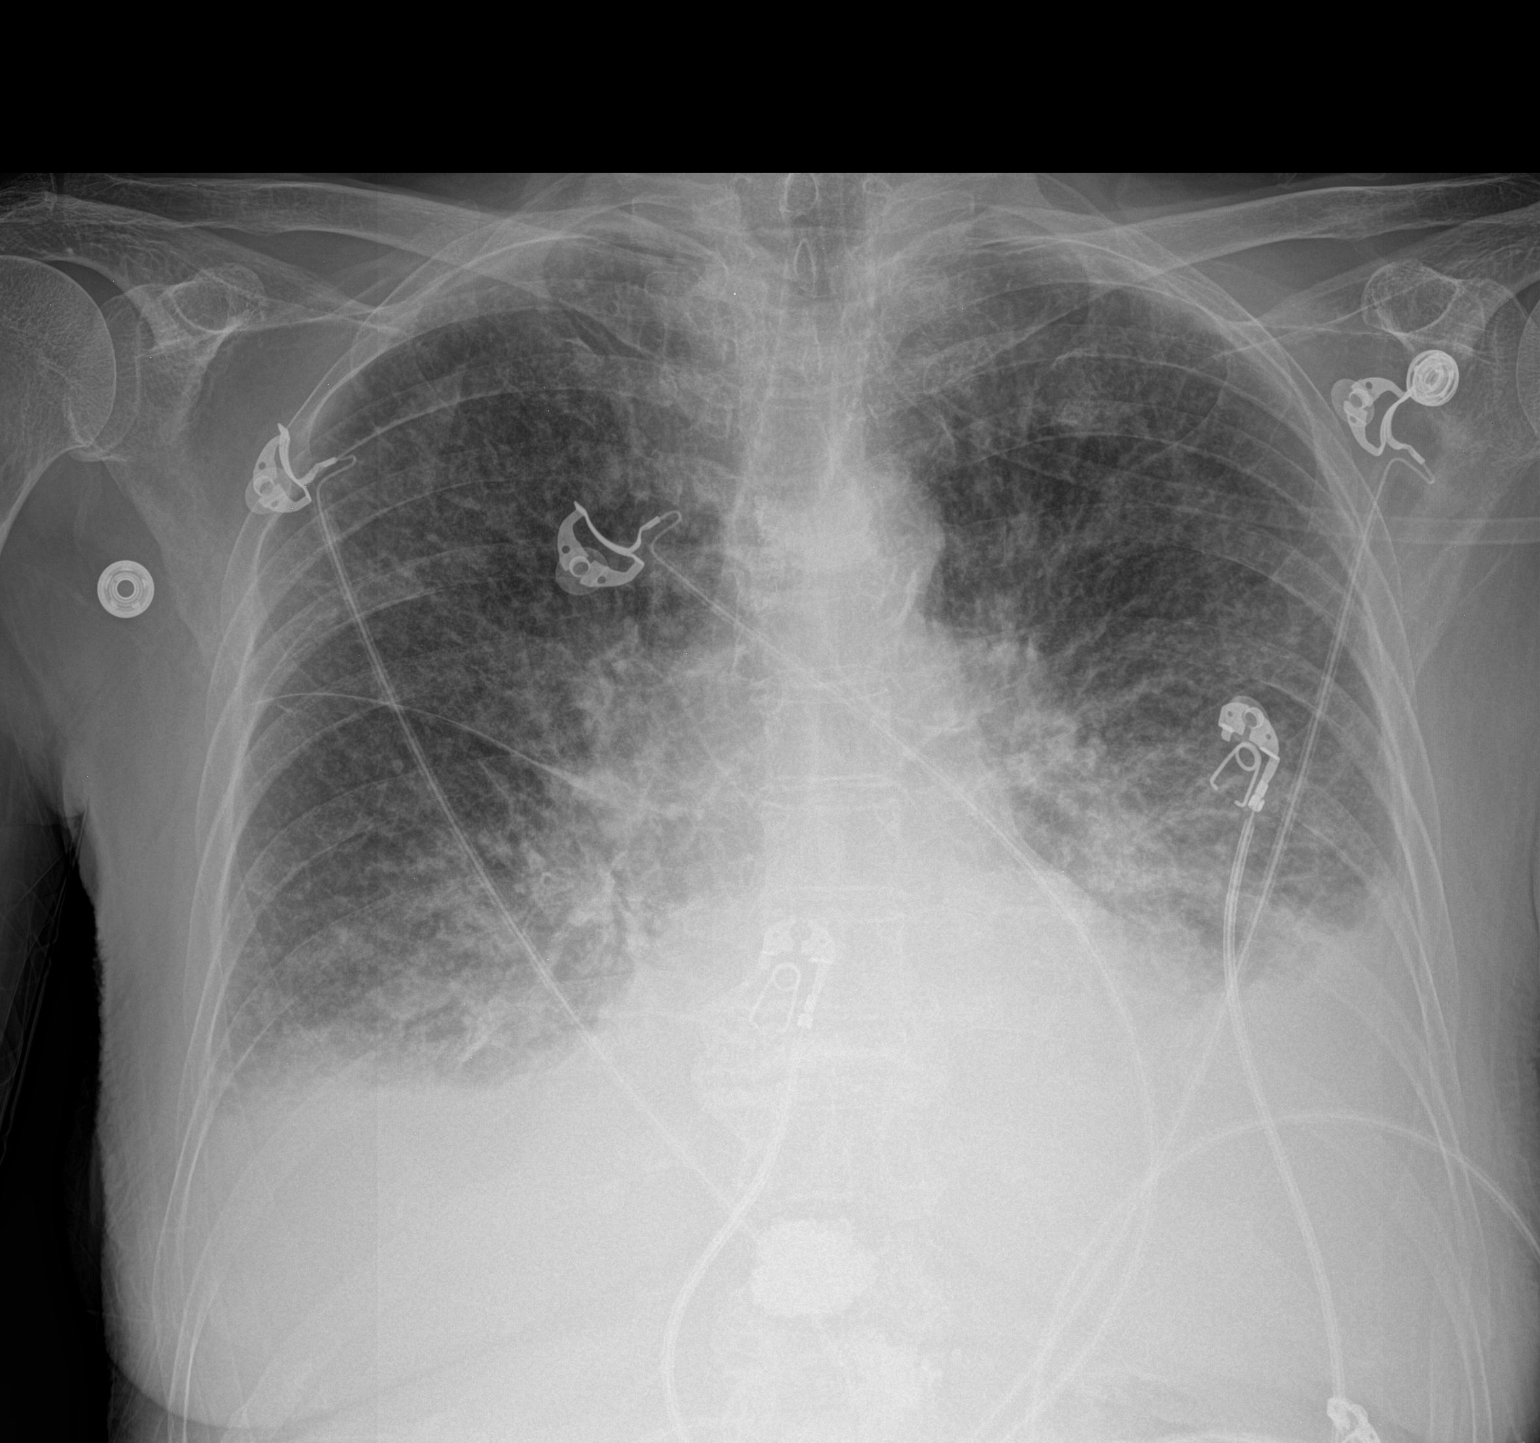

[1 of 1 positions shown; findings below may reference images not displayed]

FINDINGS: Vertebroplasty material overlies multiple thoracic and upper lumbar
vertebra. Stable cardiomediastinal silhouette with normal heart
size. No pneumothorax. Small bilateral pleural effusions. Extensive
indistinct patchy parahilar interstitial opacities with patchy
bibasilar lung opacities.
IMPRESSION: Extensive indistinct patchy parahilar interstitial opacities, favor
pulmonary edema.

Small bilateral pleural effusions with patchy bibasilar opacities
favoring atelectasis.

## 2021-04-30 MED ORDER — ONDANSETRON HCL 4 MG PO TABS: 4.0000 mg | ORAL_TABLET | Freq: Four times a day (QID) | ORAL | Status: DC | PRN

## 2021-04-30 MED ORDER — ROSUVASTATIN CALCIUM 5 MG PO TABS
10.0000 mg | ORAL_TABLET | Freq: Every day | ORAL | Status: DC
  Administered 2021-04-30 – 2021-05-01 (×2): 10 mg via ORAL
  Filled 2021-04-30 (×2): qty 2

## 2021-04-30 MED ORDER — FUROSEMIDE 10 MG/ML IJ SOLN
40.0000 mg | Freq: Once | INTRAMUSCULAR | Status: AC
  Administered 2021-04-30: 40 mg via INTRAVENOUS
  Filled 2021-04-30: qty 4

## 2021-04-30 MED ORDER — NITROGLYCERIN 2 % TD OINT
1.0000 [in_us] | TOPICAL_OINTMENT | Freq: Once | TRANSDERMAL | Status: AC
  Administered 2021-04-30: 1 [in_us] via TOPICAL
  Filled 2021-04-30: qty 1

## 2021-04-30 MED ORDER — ACETAMINOPHEN 650 MG RE SUPP: 650.0000 mg | Freq: Four times a day (QID) | RECTAL | Status: DC | PRN

## 2021-04-30 MED ORDER — NITROGLYCERIN IN D5W 200-5 MCG/ML-% IV SOLN
0.0000 ug/min | INTRAVENOUS | Status: DC
  Filled 2021-04-30: qty 250

## 2021-04-30 MED ORDER — POLYVINYL ALCOHOL 1.4 % OP SOLN
1.0000 [drp] | Freq: Every day | OPHTHALMIC | Status: DC
  Filled 2021-04-30: qty 15

## 2021-04-30 MED ORDER — ACETAMINOPHEN 325 MG PO TABS: 650.0000 mg | ORAL_TABLET | Freq: Four times a day (QID) | ORAL | Status: DC | PRN

## 2021-04-30 MED ORDER — OXYCODONE HCL 5 MG PO TABS
5.0000 mg | ORAL_TABLET | ORAL | Status: DC | PRN
  Filled 2021-04-30: qty 1

## 2021-04-30 MED ORDER — METOPROLOL TARTRATE 5 MG/5ML IV SOLN
2.5000 mg | Freq: Once | INTRAVENOUS | Status: AC
  Administered 2021-04-30: 2.5 mg via INTRAVENOUS
  Filled 2021-04-30: qty 5

## 2021-04-30 MED ORDER — APIXABAN 5 MG PO TABS
5.0000 mg | ORAL_TABLET | Freq: Two times a day (BID) | ORAL | Status: DC
  Administered 2021-04-30 – 2021-05-01 (×3): 5 mg via ORAL
  Filled 2021-04-30 (×3): qty 1

## 2021-04-30 MED ORDER — PANTOPRAZOLE SODIUM 40 MG PO TBEC
40.0000 mg | DELAYED_RELEASE_TABLET | Freq: Every day | ORAL | Status: DC
  Administered 2021-04-30 – 2021-05-01 (×2): 40 mg via ORAL
  Filled 2021-04-30 (×2): qty 1

## 2021-04-30 MED ORDER — HYDRALAZINE HCL 20 MG/ML IJ SOLN: 5.0000 mg | INTRAMUSCULAR | Status: DC | PRN

## 2021-04-30 MED ORDER — ALBUTEROL SULFATE (2.5 MG/3ML) 0.083% IN NEBU: 2.5000 mg | INHALATION_SOLUTION | Freq: Four times a day (QID) | RESPIRATORY_TRACT | Status: DC | PRN

## 2021-04-30 MED ORDER — ACYCLOVIR 400 MG PO TABS
400.0000 mg | ORAL_TABLET | Freq: Two times a day (BID) | ORAL | Status: DC
  Administered 2021-04-30 – 2021-05-01 (×3): 400 mg via ORAL
  Filled 2021-04-30 (×5): qty 1

## 2021-04-30 MED ORDER — SODIUM CHLORIDE 0.9% FLUSH
3.0000 mL | Freq: Two times a day (BID) | INTRAVENOUS | Status: DC
  Administered 2021-04-30 – 2021-05-01 (×2): 3 mL via INTRAVENOUS

## 2021-04-30 MED ORDER — ONDANSETRON HCL 4 MG/2ML IJ SOLN
4.0000 mg | Freq: Four times a day (QID) | INTRAMUSCULAR | Status: DC | PRN
  Filled 2021-04-30: qty 2

## 2021-04-30 MED ORDER — TRAMADOL HCL 50 MG PO TABS
50.0000 mg | ORAL_TABLET | Freq: Every day | ORAL | Status: DC | PRN
  Administered 2021-04-30: 100 mg via ORAL
  Filled 2021-04-30: qty 2

## 2021-04-30 MED ORDER — MELATONIN 5 MG PO TABS
10.0000 mg | ORAL_TABLET | Freq: Every day | ORAL | Status: DC
  Administered 2021-04-30: 10 mg via ORAL
  Filled 2021-04-30: qty 2

## 2021-04-30 MED ORDER — ALPRAZOLAM 0.25 MG PO TABS
0.2500 mg | ORAL_TABLET | Freq: Every day | ORAL | Status: DC | PRN
  Administered 2021-04-30: 0.25 mg via ORAL
  Filled 2021-04-30: qty 1

## 2021-04-30 MED ORDER — LIP MEDEX EX OINT: 1.0000 "application " | TOPICAL_OINTMENT | Freq: Every day | CUTANEOUS | Status: DC | PRN

## 2021-04-30 MED ORDER — FUROSEMIDE 10 MG/ML IJ SOLN
40.0000 mg | Freq: Two times a day (BID) | INTRAMUSCULAR | Status: DC
  Administered 2021-04-30 – 2021-05-01 (×2): 40 mg via INTRAVENOUS
  Filled 2021-04-30 (×2): qty 4

## 2021-04-30 MED ORDER — ZOLPIDEM TARTRATE 5 MG PO TABS
5.0000 mg | ORAL_TABLET | Freq: Every evening | ORAL | Status: DC | PRN
  Administered 2021-04-30: 5 mg via ORAL
  Filled 2021-04-30: qty 1

## 2021-04-30 MED ORDER — METOPROLOL TARTRATE 12.5 MG HALF TABLET
12.5000 mg | ORAL_TABLET | Freq: Two times a day (BID) | ORAL | Status: DC
  Administered 2021-04-30 – 2021-05-01 (×2): 12.5 mg via ORAL
  Filled 2021-04-30 (×3): qty 1

## 2021-04-30 NOTE — ED Notes (Signed)
unsuccesful attempt to give report  The bed is not cleaned they want me to call back

## 2021-04-30 NOTE — Progress Notes (Signed)
*  PRELIMINARY RESULTS* Echocardiogram 2D Echocardiogram has been performed.  Wendy [name] 04/30/2021, 2:49 PM

## 2021-04-30 NOTE — ED Provider Notes (Signed)
I provided a substantive portion of the care of this patient.  I personally performed the entirety of the exam for this encounter.  EKG Interpretation  Date/Time:  Sunday Apr 30 2021 08:02:06 EDT Ventricular Rate:  104 PR Interval:  148 QRS Duration: 77 QT Interval:  304 QTC Calculation: 400 R Axis:   9 Text Interpretation: Sinus tachycardia Low voltage, extremity leads Borderline repolarization abnormality no STEMI, no sig change from previous. Confirmed by Charlesetta Shanks [phone]) on 04/30/2021 8:51:38 AM  Presents very short of breath.  Reportedly shortness of breath was rapid in onset this morning.  Patient was not significantly short of breath going to bed last night.  She denies chest pain.  Patient is diaphoretic and in significant respiratory distress.  Mental status is alert and oriented.  She can respond in 1-2 word sentences.  Lungs have soft breath sounds with crackles and wheeze throughout.  Heart regular, tachycardic, heart sounds moderately obscured by pulmonary noise.  I cannot appreciate rub murmur gallop at this time.  Abdomen is soft without guarding.  Rectal exam is for some red blood locally around the anus.  Digital exam shows soft brown stool higher in the vault.  No melena or clots.  Patient has 2+ pitting edema bilateral lower extremities to above the knees.  Mental status is awake and alert.  She can follow commands but is generally in distress from severe dyspnea.  No focal motor deficits.  08:45 patient has made significant improvement on BiPAP.  She is speaking in full sentences and reports feeling much better.  She is starting to put out urine.  Blood pressure repeated 177/89.  She currently has on Nitropaste.  Will add 2.5 mg of Lopressor.  Patient will be admitted.    Charlesetta Shanks, MD 04/30/21 [phone]

## 2021-04-30 NOTE — ED Notes (Signed)
Pt is on bipap

## 2021-04-30 NOTE — ED Notes (Signed)
resp called.
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
## 2021-04-30 NOTE — ED Notes (Signed)
Report given to rn on 2w 

## 2021-05-01 DIAGNOSIS — Z885 Allergy status to narcotic agent status: Secondary | ICD-10-CM | POA: Diagnosis not present

## 2021-05-01 DIAGNOSIS — Z801 Family history of malignant neoplasm of trachea, bronchus and lung: Secondary | ICD-10-CM | POA: Diagnosis not present

## 2021-05-01 DIAGNOSIS — I10 Essential (primary) hypertension: Secondary | ICD-10-CM | POA: Diagnosis present

## 2021-05-01 DIAGNOSIS — Z83438 Family history of other disorder of lipoprotein metabolism and other lipidemia: Secondary | ICD-10-CM | POA: Diagnosis not present

## 2021-05-01 DIAGNOSIS — Z880 Allergy status to penicillin: Secondary | ICD-10-CM | POA: Diagnosis not present

## 2021-05-01 DIAGNOSIS — Z20822 Contact with and (suspected) exposure to covid-19: Secondary | ICD-10-CM | POA: Diagnosis present

## 2021-05-01 DIAGNOSIS — R152 Fecal urgency: Secondary | ICD-10-CM | POA: Diagnosis present

## 2021-05-01 DIAGNOSIS — R0603 Acute respiratory distress: Secondary | ICD-10-CM | POA: Diagnosis present

## 2021-05-01 DIAGNOSIS — E785 Hyperlipidemia, unspecified: Secondary | ICD-10-CM

## 2021-05-01 DIAGNOSIS — J810 Acute pulmonary edema: Principal | ICD-10-CM

## 2021-05-01 DIAGNOSIS — Z95828 Presence of other vascular implants and grafts: Secondary | ICD-10-CM | POA: Diagnosis not present

## 2021-05-01 DIAGNOSIS — Z8249 Family history of ischemic heart disease and other diseases of the circulatory system: Secondary | ICD-10-CM | POA: Diagnosis not present

## 2021-05-01 DIAGNOSIS — Z87891 Personal history of nicotine dependence: Secondary | ICD-10-CM | POA: Diagnosis not present

## 2021-05-01 DIAGNOSIS — R195 Other fecal abnormalities: Secondary | ICD-10-CM | POA: Diagnosis present

## 2021-05-01 DIAGNOSIS — Z888 Allergy status to other drugs, medicaments and biological substances status: Secondary | ICD-10-CM | POA: Diagnosis not present

## 2021-05-01 DIAGNOSIS — Z66 Do not resuscitate: Secondary | ICD-10-CM | POA: Diagnosis present

## 2021-05-01 DIAGNOSIS — C9000 Multiple myeloma not having achieved remission: Secondary | ICD-10-CM | POA: Diagnosis present

## 2021-05-01 DIAGNOSIS — R159 Full incontinence of feces: Secondary | ICD-10-CM | POA: Diagnosis present

## 2021-05-01 DIAGNOSIS — Z811 Family history of alcohol abuse and dependence: Secondary | ICD-10-CM | POA: Diagnosis not present

## 2021-05-01 DIAGNOSIS — F39 Unspecified mood [affective] disorder: Secondary | ICD-10-CM | POA: Diagnosis present

## 2021-05-01 DIAGNOSIS — E875 Hyperkalemia: Secondary | ICD-10-CM | POA: Diagnosis present

## 2021-05-01 DIAGNOSIS — E8770 Fluid overload, unspecified: Secondary | ICD-10-CM | POA: Diagnosis present

## 2021-05-01 DIAGNOSIS — Z882 Allergy status to sulfonamides status: Secondary | ICD-10-CM | POA: Diagnosis not present

## 2021-05-01 DIAGNOSIS — Z86718 Personal history of other venous thrombosis and embolism: Secondary | ICD-10-CM | POA: Diagnosis not present

## 2021-05-01 DIAGNOSIS — I161 Hypertensive emergency: Secondary | ICD-10-CM | POA: Diagnosis present

## 2021-05-01 LAB — BASIC METABOLIC PANEL
Anion gap: 7 (ref 5–15)
BUN: 18 mg/dL (ref 8–23)
CO2: 32 mmol/L (ref 22–32)
Calcium: 8.4 mg/dL — ABNORMAL LOW (ref 8.9–10.3)
Chloride: 92 mmol/L — ABNORMAL LOW (ref 98–111)
Creatinine, Ser: 0.98 mg/dL (ref 0.44–1.00)
GFR, Estimated: 60 mL/min — ABNORMAL LOW (ref >60)
Glucose, Bld: 104 mg/dL — ABNORMAL HIGH (ref 70–99)
Potassium: 4 mmol/L (ref 3.5–5.1)
Sodium: 131 mmol/L — ABNORMAL LOW (ref 135–145)

## 2021-05-01 LAB — MULTIPLE MYELOMA PANEL, SERUM
Albumin SerPl Elph-Mcnc: 2.6 g/dL — ABNORMAL LOW (ref 2.9–4.4)
Albumin/Glob SerPl: 1.3 (ref 0.7–1.7)
Alpha 1: 0.3 g/dL (ref 0.0–0.4)
Alpha2 Glob SerPl Elph-Mcnc: 0.8 g/dL (ref 0.4–1.0)
B-Globulin SerPl Elph-Mcnc: 0.8 g/dL (ref 0.7–1.3)
Gamma Glob SerPl Elph-Mcnc: 0.2 g/dL — ABNORMAL LOW (ref 0.4–1.8)
Globulin, Total: 2.1 g/dL — ABNORMAL LOW (ref 2.2–3.9)
IgA: 61 mg/dL — ABNORMAL LOW (ref 64–422)
IgG (Immunoglobin G), Serum: 310 mg/dL — ABNORMAL LOW (ref 586–1602)
IgM (Immunoglobulin M), Srm: 29 mg/dL (ref 26–217)
M Protein SerPl Elph-Mcnc: 0.1 g/dL — ABNORMAL HIGH
Total Protein ELP: 4.7 g/dL — ABNORMAL LOW (ref 6.0–8.5)

## 2021-05-01 LAB — CBC
HCT: 27 % — ABNORMAL LOW (ref 36.0–46.0)
Hemoglobin: 9.3 g/dL — ABNORMAL LOW (ref 12.0–15.0)
MCH: 33 pg (ref 26.0–34.0)
MCHC: 34.4 g/dL (ref 30.0–36.0)
MCV: 95.7 fL (ref 80.0–100.0)
Platelets: 278 10*3/uL (ref 150–400)
RBC: 2.82 MIL/uL — ABNORMAL LOW (ref 3.87–5.11)
RDW: 16.1 % — ABNORMAL HIGH (ref 11.5–15.5)
WBC: 4.4 10*3/uL (ref 4.0–10.5)
nRBC: 0 % (ref 0.0–0.2)

## 2021-05-01 MED ORDER — SIMETHICONE 80 MG PO CHEW
80.0000 mg | CHEWABLE_TABLET | Freq: Four times a day (QID) | ORAL | Status: DC | PRN
  Administered 2021-05-01: 80 mg via ORAL
  Filled 2021-05-01 (×2): qty 1

## 2021-05-01 MED ORDER — NON FORMULARY: 1.0000 "application " | Freq: Two times a day (BID) | Status: DC | PRN

## 2021-05-01 MED ORDER — ALCLOMETASONE DIPROPIONATE 0.05 % EX OINT: 1.0000 "application " | TOPICAL_OINTMENT | Freq: Two times a day (BID) | CUTANEOUS | Status: DC | PRN

## 2021-05-01 MED ORDER — NAPHAZOLINE-GLYCERIN 0.012-0.25 % OP SOLN
1.0000 [drp] | Freq: Four times a day (QID) | OPHTHALMIC | Status: DC | PRN
  Filled 2021-05-01: qty 15

## 2021-05-01 NOTE — Discharge Summary (Signed)
Physician Discharge Summary  [name] RFF:[id number] DOB: 06/21/1946 DOA: 04/30/2021  PCP: Abner Greenspan, MD  Admit date: 04/30/2021 Discharge date: 05/01/2021  Admitted From: Home Disposition:  Home  Recommendations for Outpatient Follow-up:  1. Follow up with PCP in 2-3 weeks 2. Follow up with Hematology as scheduled 3. Recommend checking bmet in 1 week  Discharge Condition:Improved CODE STATUS:DNR Diet recommendation: Heart healthy   Brief/Interim Summary: 75 y.o. female with medical history significant of HTN; DVT/IVC occlusion s/p mechanical thrombectomy, on Eliquis; vertebroplasty; and multiple myeloma undergoing active chemotherapy presenting with acute onset of SOB. Patient was admitted for concerns of volume overload requiring lasix  Discharge Diagnoses:  Principal Problem:   Flash pulmonary edema (HCC) Active Problems:   Essential hypertension   Dyslipidemia   Multiple myeloma (HCC)   Heme positive stool   Hyperkalemia   DNR (do not resuscitate)   Flash pulmonary edema -Patient with prior h/o the same presenting with acute onset respiratory failure -She has been on chemotherapy and this is her second such episode occurring within a few days of chemo -She was initially placed on BIPAP while in the ER  -She has been weaned off BIPAP and is currently improved on [place] O2 -Pt was continued on Lasix 40 mg IV BID with very good diuresis  Heme + stool with anemia due to chronic disease -She also had 1 episode of fecal urgency with incontinence and was found to have heme + stool -She is on Eliquis and needs to remain on this, if possible -She denies tarry stools or BRBPR and thinks maybe she wiped too vigorously -No evidence of frank bleeding this visit -Pt has remained hemodynamically stable -Patient's baseline hgb noted to be between around 9.5 and 10   Hyperkalemia -Recent low K+ and so started on potassium repletion -Presented with hyperkalemia with potassium of  5.8 -recommend holding potassium replacement -Recommend checking bmet in 1 week  Multiple myeloma -Remains on Dara-CyD, no longer on Velcade -She is transplant ineligible -Labs indicate an excellent early response to therapy -Continue Acyclovir for prophylaxis -Pt to follow up with Hematology as scheduled  VTE -Prior DVT with apparent foreign body in the IVC thought to be related to prior vertebroplasty  -DVT/IVC occlusion s/p mechanical thrombectomy on 3/23 -She has been instructed to continue AC until at least the completion of her chemotherapy and then for another 3-6 months following  HTN -Continue Lopressor  HLD -Continue Crestor  Mood d/o -Continue Xanax, Melatonin, Ambien  DNR -Dr. Lorin Mercy discussed code status with the patient and she would not desire resuscitation and would prefer to die a natural death should that situation arise.  Discharge Instructions   Allergies as of 05/01/2021      Reactions   Bentyl [dicyclomine Hcl] Other (See Comments)   Groggy, blurred vision   Butalbital-aspirin-caffeine Other (See Comments)   hallucinations   Clonidine Derivatives Other (See Comments)   dizziness, lightheadedness, abdominal cramping, dry mouth/throat   Linzess [linaclotide] Diarrhea   Morphine And Related Other (See Comments)   Does not work   Motrin [ibuprofen] Other (See Comments)   GI upset   Penicillins Other (See Comments)   As child; reaction unknown Has patient had a PCN reaction causing immediate rash, facial/tongue/throat swelling, SOB or lightheadedness with hypotension: No Has patient had a PCN reaction causing severe rash involving mucus membranes or skin necrosis: No Has patient had a PCN reaction that required hospitalization: No Has patient had a PCN reaction occurring within  the last 10 years: No If all of the above answers are "NO", then may proceed with Cephalosporin use.   Sulfa Antibiotics Other (See Comments)   In childhood   Zanaflex  [tizanidine Hcl] Other (See Comments)   Decreased BP   Diphenhydramine Hcl Palpitations   restlessness      Medication List    STOP taking these medications   potassium chloride SA 20 MEQ tablet Commonly known as: KLOR-CON     TAKE these medications   acetaminophen 325 MG tablet Commonly known as: TYLENOL Take 2 tablets (650 mg total) by mouth every 6 (six) hours as needed for mild pain, fever or headache.   acyclovir 400 MG tablet Commonly known as: ZOVIRAX Take 1 tablet (400 mg total) by mouth 2 (two) times daily.   albuterol 108 (90 Base) MCG/ACT inhaler Commonly known as: VENTOLIN HFA Inhale 2 puffs into the lungs every 6 (six) hours as needed for wheezing or shortness of breath.   alclomethasone 0.05 % ointment Commonly known as: ACLOVATE Apply 1 application topically daily as needed (dry lips).   ALPRAZolam 0.5 MG tablet Commonly known as: Xanax Take 0.5 tablets (0.25 mg total) by mouth daily as needed for anxiety or sleep.   bumetanide 0.5 MG tablet Commonly known as: BUMEX Take 0.5 mg by mouth daily. Takes an additional tablet if needed   denosumab 60 MG/ML Sosy injection Commonly known as: PROLIA Inject 60 mg into the skin every 6 (six) months.   Eliquis 5 MG Tabs tablet Generic drug: apixaban TAKE 2 TABLETS (10 MG) BY MOUTH TWICE DAILY UNTIL 3/28; THEN ON 3/29, DECREASE TO 1 TABLET (5 MG) BY MOUTH TWICE DAILY What changed:   how much to take  how to take this  when to take this   esomeprazole 40 MG capsule Commonly known as: NEXIUM Take 1 capsule (40 mg total) by mouth daily.   Melatonin 10 MG Tabs Take 10 mg by mouth at bedtime.   metoprolol tartrate 25 MG tablet Commonly known as: LOPRESSOR TAKE 1/2 TABLET (12.5 MG TOTAL) BY MOUTH TWO TIMES DAILY. What changed: how much to take   prochlorperazine 10 MG tablet Commonly known as: COMPAZINE Take 1 tablet (10 mg total) by mouth every 6 (six) hours as needed for nausea or vomiting.    rosuvastatin 10 MG tablet Commonly known as: CRESTOR Take 1 tablet (10 mg total) by mouth daily.   SYSTANE BALANCE OP Place 1 drop into both eyes 2 (two) times daily.   traMADol 50 MG tablet Commonly known as: ULTRAM TAKE 1 TO 2 TABLETS(50 TO 100 MG) BY MOUTH EVERY 6 HOURS AS NEEDED What changed: See the new instructions.   TUMS PO Take 2-4 capsules by mouth daily as needed (heartburn).   Vitamin D 50 MCG (2000 UT) tablet Take 2,000 Units by mouth daily.   zolpidem 10 MG tablet Commonly known as: AMBIEN TAKE ONE TABLET BY MOUTH AT BEDTIME AS NEEDED FOR SLEEP What changed:   when to take this  additional instructions       Follow-up Information    Tower, Wynelle Fanny, MD. Schedule an appointment as soon as possible for a visit in 2 week(s).   Specialties: Family Medicine, Radiology Contact information: [address] [place] [address] [phone]        Orson Slick, MD Follow up.   Specialty: Hematology and Oncology Why: follow up as scheduled Contact information: [address] [place] [address] [phone]  Allergies  Allergen Reactions  . Bentyl [Dicyclomine Hcl] Other (See Comments)    Groggy, blurred vision  . Butalbital-Aspirin-Caffeine Other (See Comments)    hallucinations  . Clonidine Derivatives Other (See Comments)    dizziness, lightheadedness, abdominal cramping, dry mouth/throat  . Linzess [Linaclotide] Diarrhea  . Morphine And Related Other (See Comments)    Does not work  . Motrin [Ibuprofen] Other (See Comments)    GI upset  . Penicillins Other (See Comments)    As child; reaction unknown Has patient had a PCN reaction causing immediate rash, facial/tongue/throat swelling, SOB or lightheadedness with hypotension: No Has patient had a PCN reaction causing severe rash involving mucus membranes or skin necrosis: No Has patient had a PCN reaction that required hospitalization: No Has patient had a  PCN reaction occurring within the last 10 years: No If all of the above answers are "NO", then may proceed with Cephalosporin use.  . Sulfa Antibiotics Other (See Comments)    In childhood  . Zanaflex [Tizanidine Hcl] Other (See Comments)    Decreased BP  . Diphenhydramine Hcl Palpitations    restlessness    Procedures/Studies: VAS [place] IVC/ILIAC (VENOUS ONLY)  Result Date: 04/24/2021 IVC/ILIAC STUDY Patient Name:  Wendy [name]  Date of Exam:   04/24/2021 Medical Rec #: [id number]      Accession #:    [id number] Date of Birth: 12-04-1946      Patient Gender: F Patient Age:   74Y Exam Location:  [facility] Procedure:      VAS [place] IVC/ILIAC (VENOUS ONLY) Referring Phys: [id number] [place] Dom CAIN --------------------------------------------------------------------------------  Indications: History of acute deep vein thrombosis Risk Factors: Hypertension. Other Factors: Cancer patient on chemo. Vascular Interventions: 03/15/2021: Mechanical thrombectomy of IVC, bilateral                         common iliac veins, bilateral external iliac veins, and                         bilateral common femoral and femoral veins with Inari                         clottriever. Removal of foreign body IVC.  Comparison Study: 03/13/2021: Acute deep vein thrombosis involving the bilateral                   common iliac veins and external iliac veins. IVC thrombosed                   from mid portion to bifurcation. Performing Technologist: Ivan Croft  Examination Guidelines: A complete evaluation includes B-mode imaging, spectral Doppler, color Doppler, and power Doppler as needed of all accessible portions of each vessel. Bilateral testing is considered an integral part of a complete examination. Limited examinations for reoccurring indications may be performed as noted.  IVC/Iliac Findings: +----------+------+--------+--------+    IVC    PatentThrombusComments  +----------+------+--------+--------+ IVC Prox  patent                 +----------+------+--------+--------+ IVC Mid   patent                 +----------+------+--------+--------+ IVC Distalpatent                 +----------+------+--------+--------+  +-------------------+---------+-----------+---------+-----------+--------+         CIV  RT-PatentRT-ThrombusLT-PatentLT-ThrombusComments +-------------------+---------+-----------+---------+-----------+--------+ Common Iliac Prox   patent              patent                      +-------------------+---------+-----------+---------+-----------+--------+ Common Iliac Mid    patent              patent                      +-------------------+---------+-----------+---------+-----------+--------+ Common Iliac Distal patent              patent                      +-------------------+---------+-----------+---------+-----------+--------+  +-------------------------+---------+-----------+---------+-----------+--------+            EIV           RT-PatentRT-ThrombusLT-PatentLT-ThrombusComments +-------------------------+---------+-----------+---------+-----------+--------+ External Iliac Vein Prox  patent              patent                      +-------------------------+---------+-----------+---------+-----------+--------+ External Iliac Vein Mid   patent              patent                      +-------------------------+---------+-----------+---------+-----------+--------+ External Iliac Vein       patent              patent                      Distal                                                                    +-------------------------+---------+-----------+---------+-----------+--------+   Summary: IVC/Iliac: No evidence of thrombus in IVC and Iliac veins.  *See table(s) above for measurements and observations.  Electronically signed by Harold Barban MD on 04/24/2021 at 2:56:35 PM.     Final    DG Chest Port 1 View  Result Date: 04/30/2021 CLINICAL DATA:  Dyspnea EXAM: PORTABLE CHEST 1 VIEW COMPARISON:  02/11/2021 chest radiograph. FINDINGS: Vertebroplasty material overlies multiple thoracic and upper lumbar vertebra. Stable cardiomediastinal silhouette with normal heart size. No pneumothorax. Small bilateral pleural effusions. Extensive indistinct patchy parahilar interstitial opacities with patchy bibasilar lung opacities. IMPRESSION: Extensive indistinct patchy parahilar interstitial opacities, favor pulmonary edema. Small bilateral pleural effusions with patchy bibasilar opacities favoring atelectasis. Electronically Signed   By: Ilona Sorrel M.D.   On: 04/30/2021 08:19   ECHOCARDIOGRAM COMPLETE  Result Date: 04/30/2021    ECHOCARDIOGRAM REPORT   Patient Name:   Wendy [name] Date of Exam: 04/30/2021 Medical Rec #:  [id number]     Height:       62.0 in Accession #:    [id number]    Weight:       128.3 lb Date of Birth:  [birth year]     BSA:          1.583 m Patient Age:    59 years      BP:           153/85 mmHg Patient Gender: F  HR:           90 bpm. Exam Location:  Inpatient Procedure: 2D Echo, Cardiac Doppler and Color Doppler Indications:    CHF-Acute Systolic 846.65 / L93.57  History:        Patient has prior history of Echocardiogram examinations, most                 recent 02/11/2021. CAD; Risk Factors:Hypertension and                 Dyslipidemia.  Sonographer:    Alvino Chapel RCS Referring Phys: [place]  1. Left ventricular ejection fraction, by estimation, is 60 to 65%. The left ventricle has normal function. The left ventricle has no regional wall motion abnormalities. Left ventricular diastolic parameters are consistent with Grade II diastolic dysfunction (pseudonormalization). Elevated left atrial pressure.  2. Right ventricular systolic function is normal. The right ventricular size is normal. There is mildly elevated pulmonary artery  systolic pressure. The estimated right ventricular systolic pressure is 01.7 mmHg.  3. Left atrial size was mildly dilated.  4. Large pleural effusion in the left lateral region.  5. The mitral valve is normal in structure. Mild mitral valve regurgitation.  6. The aortic valve is tricuspid. Aortic valve regurgitation is not visualized.  7. The inferior vena cava is normal in size with greater than 50% respiratory variability, suggesting right atrial pressure of 3 mmHg. Comparison(s): No significant change from prior study. Prior images reviewed side by side. FINDINGS  Left Ventricle: Left ventricular ejection fraction, by estimation, is 60 to 65%. The left ventricle has normal function. The left ventricle has no regional wall motion abnormalities. The left ventricular internal cavity size was normal in size. There is  no left ventricular hypertrophy. Left ventricular diastolic parameters are consistent with Grade II diastolic dysfunction (pseudonormalization). Elevated left atrial pressure. Right Ventricle: The right ventricular size is normal. No increase in right ventricular wall thickness. Right ventricular systolic function is normal. There is mildly elevated pulmonary artery systolic pressure. The tricuspid regurgitant velocity is 3.16  m/s, and with an assumed right atrial pressure of 3 mmHg, the estimated right ventricular systolic pressure is 79.3 mmHg. Left Atrium: Left atrial size was mildly dilated. Right Atrium: Right atrial size was normal in size. Pericardium: There is no evidence of pericardial effusion. Mitral Valve: The mitral valve is normal in structure. Mild mitral valve regurgitation, with centrally-directed jet. Tricuspid Valve: The tricuspid valve is normal in structure. Tricuspid valve regurgitation is mild. Aortic Valve: The aortic valve is tricuspid. Aortic valve regurgitation is not visualized. Pulmonic Valve: The pulmonic valve was normal in structure. Pulmonic valve regurgitation is not  visualized. Aorta: The aortic root is normal in size and structure. Venous: The inferior vena cava is normal in size with greater than 50% respiratory variability, suggesting right atrial pressure of 3 mmHg. IAS/Shunts: No atrial level shunt detected by color flow Doppler. Additional Comments: There is a large pleural effusion in the left lateral region.  LEFT VENTRICLE PLAX 2D LVIDd:         3.20 cm  Diastology LVIDs:         2.10 cm  LV e' medial:    4.57 cm/s LV PW:         1.00 cm  LV E/e' medial:  21.8 LV IVS:        1.20 cm  LV e' lateral:   7.40 cm/s LVOT diam:     1.50 cm  LV E/e' lateral: 13.4 LV SV:         41 LV SV Index:   26 LVOT Area:     1.77 cm  RIGHT VENTRICLE RV S prime:     14.10 cm/s TAPSE (M-mode): 1.9 cm LEFT ATRIUM             Index       RIGHT ATRIUM           Index LA diam:        3.10 cm 1.96 cm/m  RA Area:     13.40 cm LA Vol (A2C):   49.8 ml 31.46 ml/m RA Volume:   33.00 ml  20.85 ml/m LA Vol (A4C):   52.2 ml 32.98 ml/m LA Biplane Vol: 54.9 ml 34.68 ml/m  AORTIC VALVE LVOT Vmax:   123.00 cm/s LVOT Vmean:  89.300 cm/s LVOT VTI:    0.233 m  AORTA Ao Root diam: 2.70 cm MITRAL VALVE               TRICUSPID VALVE MV Area (PHT): 5.09 cm    TR Peak grad:   39.9 mmHg MV Decel Time: 149 msec    TR Vmax:        316.00 cm/s MV E velocity: 99.40 cm/s MV A velocity: 85.70 cm/s  SHUNTS MV E/A ratio:  1.16        Systemic VTI:  0.23 m                            Systemic Diam: 1.50 cm Mihai Croitoru MD Electronically signed by Sanda Klein MD Signature Date/Time: 04/30/2021/3:28:39 PM    Final      Subjective: Very eager to go home  Discharge Exam: Vitals:   05/01/21 0757 05/01/21 0759  BP:  121/71  Pulse: 84   Resp: 17 20  Temp: 98.5 F (36.9 C)   SpO2: (!) 89%    Vitals:   05/01/21 0127 05/01/21 0405 05/01/21 0757 05/01/21 0759  BP:    121/71  Pulse: 79  84   Resp: _0 Temp:  97.9 F (36.6 C) 98.5 F (36.9 C)   TempSrc:  Oral Oral   SpO2: 94%  (!) 89%      General: Pt is alert, awake, not in acute distress Cardiovascular: RRR, S1/S2 +, no rubs, no gallops Respiratory: CTA bilaterally, no wheezing, no rhonchi Abdominal: Soft, NT, ND, bowel sounds + Extremities: no edema, no cyanosis   The results of significant diagnostics from this hospitalization (including imaging, microbiology, ancillary and laboratory) are listed below for reference.     Microbiology: Recent Results (from the past 240 hour(s))  Culture, blood (routine x 2)     Status: None (Preliminary result)   Collection Time: 04/30/21  8:00 AM   Specimen: BLOOD  Result Value Ref Range Status   Specimen Description BLOOD SITE NOT SPECIFIED  Final   Special Requests   Final    BOTTLES DRAWN AEROBIC AND ANAEROBIC Blood Culture results may not be optimal due to an inadequate volume of blood received in culture bottles   Culture   Final    NO GROWTH < 24 HOURS Performed at [facility], [address], [place], [place] [address]    Report Status PENDING  Incomplete  Culture, blood (routine x 2)     Status: None (Preliminary result)   Collection Time: 04/30/21  8:10 AM  Specimen: BLOOD  Result Value Ref Range Status   Specimen Description BLOOD SITE NOT SPECIFIED  Final   Special Requests   Final    BOTTLES DRAWN AEROBIC AND ANAEROBIC Blood Culture results may not be optimal due to an inadequate volume of blood received in culture bottles   Culture   Final    NO GROWTH < 24 HOURS Performed at [facility], [address], [place], [place] [address]    Report Status PENDING  Incomplete  Resp Panel by RT-PCR (Flu A&B, Covid) Nasopharyngeal Swab     Status: None   Collection Time: 04/30/21  8:18 AM   Specimen: Nasopharyngeal Swab; Nasopharyngeal(NP) swabs in vial transport medium  Result Value Ref Range Status   SARS Coronavirus 2 by RT PCR NEGATIVE NEGATIVE Final    Comment: (NOTE) SARS-CoV-2 target nucleic acids are NOT DETECTED.  The SARS-CoV-2 RNA is  generally detectable in upper respiratory specimens during the acute phase of infection. The lowest concentration of SARS-CoV-2 viral copies this assay can detect is 138 copies/mL. A negative result does not preclude SARS-Cov-2 infection and should not be used as the sole basis for treatment or other patient management decisions. A negative result may occur with  improper specimen collection/handling, submission of specimen other than nasopharyngeal swab, presence of viral mutation(s) within the areas targeted by this assay, and inadequate number of viral copies(<138 copies/mL). A negative result must be combined with clinical observations, patient history, and epidemiological information. The expected result is Negative.  Fact Sheet for Patients:  EntrepreneurPulse.com.au  Fact Sheet for Healthcare Providers:  IncredibleEmployment.be  This test is no t yet approved or cleared by the [place] FDA and  has been authorized for detection and/or diagnosis of SARS-CoV-2 by FDA under an Emergency Use Authorization (EUA). This EUA will remain  in effect (meaning this test can be used) for the duration of the COVID-19 declaration under Section 564(b)(1) of the Act, 21 U.S.C.section 360bbb-3(b)(1), unless the authorization is terminated  or revoked sooner.       Influenza A by PCR NEGATIVE NEGATIVE Final   Influenza B by PCR NEGATIVE NEGATIVE Final    Comment: (NOTE) The Xpert Xpress SARS-CoV-2/FLU/RSV plus assay is intended as an aid in the diagnosis of influenza from Nasopharyngeal swab specimens and should not be used as a sole basis for treatment. Nasal washings and aspirates are unacceptable for Xpert Xpress SARS-CoV-2/FLU/RSV testing.  Fact Sheet for Patients: EntrepreneurPulse.com.au  Fact Sheet for Healthcare Providers: IncredibleEmployment.be  This test is not yet approved or cleared by the [place] FDA and has been authorized for detection and/or diagnosis of SARS-CoV-2 by FDA under an Emergency Use Authorization (EUA). This EUA will remain in effect (meaning this test can be used) for the duration of the COVID-19 declaration under Section 564(b)(1) of the Act, 21 U.S.C. section 360bbb-3(b)(1), unless the authorization is terminated or revoked.  Performed at [facility], [place] [address], [place], [place] [address]      Labs: BNP (last 3 results) Recent Labs    02/11/21 0236 02/13/21 1304 04/30/21 1256  BNP 991.9* 314.3* 7,903.8*   Basic Metabolic Panel: Recent Labs  Lab 04/27/21 0826 04/30/21 0802 04/30/21 0815 05/01/21 0347  NA 133* 129* 130* 131*  K 2.8* 5.8* 3.7 4.0  CL 93* 92*  --  92*  CO2 31 27  --  32  GLUCOSE 122* 212*  --  104*  BUN 13 16  --  18  CREATININE 0.75 0.99  --  0.98  CALCIUM 8.7* 8.5*  --  8.4*  MG  --  2.0  --   --    Liver Function Tests: Recent Labs  Lab 04/27/21 0826 04/30/21 0802  AST 15 49*  ALT <6 13  ALKPHOS 65 69  BILITOT 0.4 2.1*  PROT 5.0* 4.9*  ALBUMIN 2.4* 2.6*   No results for input(s): LIPASE, AMYLASE in the last 168 hours. No results for input(s): AMMONIA in the last 168 hours. CBC: Recent Labs  Lab 04/27/21 0826 04/30/21 0802 04/30/21 0815 04/30/21 1807 05/01/21 0347  WBC 3.1* 3.0*  --  3.1* 4.4  NEUTROABS 2.3 2.1  --   --   --   HGB 10.5* 12.2 11.9* 10.8* 9.3*  HCT 30.3* 36.5 35.0* 30.8* 27.0*  MCV 94.7 100.3*  --  94.8 95.7  PLT 282 372  --  287 278   Cardiac Enzymes: No results for input(s): CKTOTAL, CKMB, CKMBINDEX, TROPONINI in the last 168 hours. BNP: Invalid input(s): POCBNP CBG: No results for input(s): GLUCAP in the last 168 hours. D-Dimer No results for input(s): DDIMER in the last 72 hours. Hgb A1c No results for input(s): HGBA1C in the last 72 hours. Lipid Profile No results for input(s): CHOL, HDL, LDLCALC, TRIG, CHOLHDL, LDLDIRECT in the last 72 hours. Thyroid  function studies No results for input(s): TSH, T4TOTAL, T3FREE, THYROIDAB in the last 72 hours.  Invalid input(s): FREET3 Anemia work up No results for input(s): VITAMINB12, FOLATE, FERRITIN, TIBC, IRON, RETICCTPCT in the last 72 hours. Urinalysis    Component Value Date/Time   COLORURINE YELLOW 03/13/2021 2051   APPEARANCEUR CLEAR 03/13/2021 2051   LABSPEC 1.030 03/13/2021 2051   PHURINE 6.0 03/13/2021 2051   GLUCOSEU NEGATIVE 03/13/2021 2051   HGBUR NEGATIVE 03/13/2021 2051   BILIRUBINUR NEGATIVE 03/13/2021 2051   KETONESUR 5 (A) 03/13/2021 2051   PROTEINUR >=300 (A) 03/13/2021 2051   NITRITE NEGATIVE 03/13/2021 2051   LEUKOCYTESUR NEGATIVE 03/13/2021 2051   Sepsis Labs Invalid input(s): PROCALCITONIN,  WBC,  LACTICIDVEN Microbiology Recent Results (from the past 240 hour(s))  Culture, blood (routine x 2)     Status: None (Preliminary result)   Collection Time: 04/30/21  8:00 AM   Specimen: BLOOD  Result Value Ref Range Status   Specimen Description BLOOD SITE NOT SPECIFIED  Final   Special Requests   Final    BOTTLES DRAWN AEROBIC AND ANAEROBIC Blood Culture results may not be optimal due to an inadequate volume of blood received in culture bottles   Culture   Final    NO GROWTH < 24 HOURS Performed at [facility] [facility], [place] [address], [place], [place] [address]    Report Status PENDING  Incomplete  Culture, blood (routine x 2)     Status: None (Preliminary result)   Collection Time: 04/30/21  8:10 AM   Specimen: BLOOD  Result Value Ref Range Status   Specimen Description BLOOD SITE NOT SPECIFIED  Final   Special Requests   Final    BOTTLES DRAWN AEROBIC AND ANAEROBIC Blood Culture results may not be optimal due to an inadequate volume of blood received in culture bottles   Culture   Final    NO GROWTH < 24 HOURS Performed at [facility], [place] [address] [place], [place] [address]    Report Status PENDING  Incomplete  Resp Panel by RT-PCR (Flu A&B, Covid)  Nasopharyngeal Swab     Status: None  Collection Time: 04/30/21  8:18 AM   Specimen: Nasopharyngeal Swab; Nasopharyngeal(NP) swabs in vial transport medium  Result Value Ref Range Status   SARS Coronavirus 2 by RT PCR NEGATIVE NEGATIVE Final    Comment: (NOTE) SARS-CoV-2 target nucleic acids are NOT DETECTED.  The SARS-CoV-2 RNA is generally detectable in upper respiratory specimens during the acute phase of infection. The lowest concentration of SARS-CoV-2 viral copies this assay can detect is 138 copies/mL. A negative result does not preclude SARS-Cov-2 infection and should not be used as the sole basis for treatment or other patient management decisions. A negative result may occur with  improper specimen collection/handling, submission of specimen other than nasopharyngeal swab, presence of viral mutation(s) within the areas targeted by this assay, and inadequate number of viral copies(<138 copies/mL). A negative result must be combined with clinical observations, patient history, and epidemiological information. The expected result is Negative.  Fact Sheet for Patients:  EntrepreneurPulse.com.au  Fact Sheet for Healthcare Providers:  IncredibleEmployment.be  This test is no t yet approved or cleared by the [place] FDA and  has been authorized for detection and/or diagnosis of SARS-CoV-2 by FDA under an Emergency Use Authorization (EUA). This EUA will remain  in effect (meaning this test can be used) for the duration of the COVID-19 declaration under Section 564(b)(1) of the Act, 21 U.S.C.section 360bbb-3(b)(1), unless the authorization is terminated  or revoked sooner.       Influenza A by PCR NEGATIVE NEGATIVE Final   Influenza B by PCR NEGATIVE NEGATIVE Final    Comment: (NOTE) The Xpert Xpress SARS-CoV-2/FLU/RSV plus assay is intended as an aid in the diagnosis of influenza from Nasopharyngeal swab specimens and should not be  used as a sole basis for treatment. Nasal washings and aspirates are unacceptable for Xpert Xpress SARS-CoV-2/FLU/RSV testing.  Fact Sheet for Patients: EntrepreneurPulse.com.au  Fact Sheet for Healthcare Providers: IncredibleEmployment.be  This test is not yet approved or cleared by the [place] FDA and has been authorized for detection and/or diagnosis of SARS-CoV-2 by FDA under an Emergency Use Authorization (EUA). This EUA will remain in effect (meaning this test can be used) for the duration of the COVID-19 declaration under Section 564(b)(1) of the Act, 21 U.S.C. section 360bbb-3(b)(1), unless the authorization is terminated or revoked.  Performed at [facility], [place] [address] [place], [place] [address]    Time spent: 30 min  SIGNED:   Marylu Lund, MD  [facility] 05/01/2021, 12:32 PM  If 7PM-7AM, please contact night-coverage

## 2021-05-01 NOTE — TOC Initial Note (Signed)
Transition of Care (TOC) - Initial/Assessment Note    Patient Details  Name: Wendy [name]. Steven MRN: [id number] Date of Birth: 1946/02/11  Transition of Care [facility]) CM/SW Contact:    Joanne Chars, LCSW Phone Number: 05/01/2021, 3:58 PM  Clinical Narrative:    Pt discharging home today, [facility] will resume/conclude services.  Pt reports she was supposed to meet with [facility] PT for one more session prior to being hospitalized.  No equipment needs. No other needs identified.                Expected Discharge Plan: [place] Barriers to Discharge: No Barriers Identified   Patient Goals and CMS Choice Patient states their goals for this hospitalization and ongoing recovery are:: "get back to doing what I was doing before all this" CMS Medicare.gov Compare Post Acute Care list provided to::  (Pt active with [facility], wants to continue/conclude)    Expected Discharge Plan and Services Expected Discharge Plan: [place] In-house Referral: Clinical Social Work     Living arrangements for the past 2 months: [place] Expected Discharge Date: 05/01/21               DME Arranged: N/A         HH Arranged: PT HH Agency: [place] Date [place]: 05/01/21 Time HH Agency Contacted: 32 Representative spoke with at [place]: Cory-Left message  Prior Living Arrangements/Services Living arrangements for the past 2 months: [place] with:: Self Patient language and need for interpreter reviewed:: Yes Do you feel safe going back to the place where you live?: Yes      Need for Family Participation in Patient Care: No (Comment) Care giver support system in place?: Yes (comment) Current home services: Home OT,Home PT Criminal Activity/Legal Involvement Pertinent to Current Situation/Hospitalization: No - Comment as needed  Activities of Daily Living      Permission Sought/Granted Permission sought to  share information with : Family Supports Permission granted to share information with : Yes, Verbal Permission Granted  Share Information with NAME: daughter Amy           Emotional Assessment Appearance:: Appears stated age Attitude/Demeanor/Rapport: Engaged Affect (typically observed): Appropriate,Pleasant Orientation: : Oriented to Self,Oriented to Place,Oriented to  Time,Oriented to Situation Alcohol / Substance Use: Not Applicable Psych Involvement: No (comment)  Admission diagnosis:  Hyperkalemia [E87.5] Respiratory distress [R06.03] Flash pulmonary edema (HCC) [J81.0] Hypertensive emergency [I16.1] Volume overload [E87.70] Patient Active Problem List   Diagnosis Date Noted  . Volume overload 05/01/2021  . Flash pulmonary edema ([place]) 04/30/2021  . Heme positive stool 04/30/2021  . Hyperkalemia 04/30/2021  . DNR (do not resuscitate) 04/30/2021  . Compression fracture of T10 vertebra ([place]) 03/14/2021  . DVT (deep venous thrombosis) ([place]) 03/13/2021  . Multiple myeloma ([place]) 02/10/2021  . Leucocytosis 02/10/2021  . 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 Situational anxiety 01/11/2021  . 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  . Dyslipidemia 10/19/2020  . Hearing loss 06/14/2020  . Pedal edema 06/01/2020  . Aortic atherosclerosis ([place]) 04/06/2020  . CAD (coronary artery disease) 04/06/2020  . H/O compression fracture of spine 04/06/2020  . Chronic back pain 04/06/2020  . Pulmonary nodules 03/17/2020  . Bloating 06/02/2018  . Heartburn 06/02/2018  . Chronic constipation 12/31/2017  . Blood glucose elevated 09/25/2017  . History of ileus 06/07/2017  . Right carpal tunnel syndrome 12/07/2016  . Estrogen deficiency 09/24/2016  .  Routine general medical examination at a health care facility 09/04/2015  . Colon cancer screening 12/11/2014  . Encounter for Medicare annual wellness exam 05/17/2013  . Osteoarthritis 03/28/2011  . Degenerative disc disease, lumbar  03/28/2011  . Hyponatremia 02/12/2011  . Essential hypertension 08/01/2010  . Osteoporosis 08/01/2010   PCP:  Abner Greenspan, MD Pharmacy:   [place], [place] - [address], [address], [place] [address] Phone: [phone] Fax: [phone]  [facility] [place], [place] [address] Phone: [phone] Fax: [phone]  [facility] [address] [place] [address] Phone: [phone] Fax: [place] [address], [place] [address] [place] [address] Phone: [phone] Fax: [phone]  [id number] [place]Phone: [phone]: [phone]    Social Determinants of Health (SDOH) Interventions    Readmission Risk Interventions No flowsheet data found.

## 2021-05-01 NOTE — Progress Notes (Signed)
Pt educated on discharge instructions. Allotted time for questioning and teachback. Pt awaiting ride. Pt made aware IV site to be removed.

## 2021-05-01 NOTE — Care Management Obs Status (Signed)
[place] NOTIFICATION   Patient Details  Name: Wendy [name]. St[place] MRN: [id number] Date of Birth: 1946/12/21   Medicare Observation Status Notification Given:  Yes    Joanne Chars, LCSW 05/01/2021, 10:48 AM

## 2021-05-01 NOTE — Discharge Instructions (Signed)
Information on my medicine - ELIQUIS (apixaban)  This medication education was reviewed with me or my healthcare representative as part of my discharge preparation.  The pharmacist that spoke with me during my hospital stay was:    Why was Eliquis prescribed for you? Eliquis was prescribed to treat blood clots that may have been found in the veins of your legs (deep vein thrombosis) or in your lungs (pulmonary embolism) and to reduce the risk of them occurring again.  What do You need to know about Eliquis ? Continue Eliquis 5mg TWICE daily.  Eliquis may be taken with or without food.   Try to take the dose about the same time in the morning and in the evening. If you have difficulty swallowing the tablet whole please discuss with your pharmacist how to take the medication safely.  Take Eliquis exactly as prescribed and DO NOT stop taking Eliquis without talking to the doctor who prescribed the medication.  Stopping may increase your risk of developing a new blood clot.  Refill your prescription before you run out.  After discharge, you should have regular check-up appointments with your healthcare provider that is prescribing your Eliquis.    What do you do if you miss a dose? If a dose of ELIQUIS is not taken at the scheduled time, take it as soon as possible on the same day and twice-daily administration should be resumed. The dose should not be doubled to make up for a missed dose.  Important Safety Information A possible side effect of Eliquis is bleeding. You should call your healthcare provider right away if you experience any of the following: ? Bleeding from an injury or your nose that does not stop. ? Unusual colored urine (red or dark brown) or unusual colored stools (red or black). ? Unusual bruising for unknown reasons. ? A serious fall or if you hit your head (even if there is no bleeding).  Some medicines may interact with Eliquis and might increase your risk of  bleeding or clotting while on Eliquis. To help avoid this, consult your healthcare provider or pharmacist prior to using any new prescription or non-prescription medications, including herbals, vitamins, non-steroidal anti-inflammatory drugs (NSAIDs) and supplements.  This website has more information on Eliquis (apixaban): http://www.eliquis.com/eliquis/home   

## 2021-05-01 NOTE — Evaluation (Signed)
Occupational Therapy Evaluation Patient Details Name: Wendy [name]. [place] MRN: [id number] DOB: 02-03-1946 Today's Date: 05/01/2021    History of Present Illness 75 y.o. female presenting to ED with c/o SOB on the morning of 5/8. In ED patient diaphoretic and in significant respiratory distress. Patient diagnosed with flash pulmonary edema 2/2 chemo and heme (+) stool. Patient with previous hosptial admission 02/2021 and 01/2021 with extensive bilateral LE DVT/s and occlusive thrombus s/p thrombectomy. PMHx significant for HTN, HLD, T10 compression fx s/p kyphoplasty 2010, and multiple myeloma undergoing active chemo.   Clinical Impression   PTA patient was living alone in a 2-level private residence with bedroom/bathroom on 2nd level and was grossly Mod I with ADLs/IADLs using AD. Patient currently functioning near baseline demonstrating observed ADLs including ADL transfers, grooming, bathing and dressing standing at sink level with Min guard to supervision A. Patient also limited by deficits listed below including decreased activity tolerance and mild generalized weakness and would benefit from continued acute OT services in prep for safe d/c home. Patient reports recently completing [place] but would like to resume HHPT services. OT will continue to follow acutely.     Follow Up Recommendations  No OT follow up;Supervision - Intermittent    Equipment Recommendations  None recommended by OT (Patient has necessary DME.)    Recommendations for Other Services       Precautions / Restrictions Precautions Precautions: Fall Restrictions Weight Bearing Restrictions: No      Mobility Bed Mobility Overal bed mobility: Needs Assistance Bed Mobility: Supine to Sit     Supine to sit: Supervision     General bed mobility comments: Supervision A for safety and line management.    Transfers Overall transfer level: Needs assistance Equipment used: 1 person hand held assist Transfers: Sit to/from  Stand Sit to Stand: Min guard;Supervision         General transfer comment: Min guard for steadying without AD.    Balance Overall balance assessment: Needs assistance Sitting-balance support: No upper extremity supported;Feet supported Sitting balance-Leahy Scale: Good     Standing balance support: Single extremity supported;During functional activity Standing balance-Leahy Scale: Fair Standing balance comment: Reliant on at least unilateral UE support on sink surface during bathing/dressing in standing.                           ADL either performed or assessed with clinical judgement   ADL Overall ADL's : Needs assistance/impaired Eating/Feeding: Independent   Grooming: Supervision/safety;Standing Grooming Details (indicate cue type and reason): 2/3 grooming tasks standing at sink level. Upper Body Bathing: Supervision/ safety;Standing   Lower Body Bathing: Supervison/ safety;Sit to/from stand Lower Body Bathing Details (indicate cue type and reason): Able to wash front perineal area and buttocks in standing at sink level without need for rest break. Upper Body Dressing : Supervision/safety;Standing   Lower Body Dressing: Minimal assistance;Sit to/from stand Lower Body Dressing Details (indicate cue type and reason): Min A to thread LLE only. Able to hike underwear over hips in standing with supervision A for safety. Toilet Transfer: Chief Financial Officer Details (indicate cue type and reason): Min guard initially progressing to supervision A.         Functional mobility during ADLs: Min guard General ADL Comments: Patient is limited by mild generalized weakness requiring Min guard to supervision A grossly for observed ADLs including bathing/dressing in standing at sink level.     Vision Baseline Vision/History: Wears glasses Wears Glasses:  Reading only Patient Visual Report: No change from baseline       Perception     Praxis       Pertinent Vitals/Pain Pain Assessment: No/denies pain     Hand Dominance Right   Extremity/Trunk Assessment Upper Extremity Assessment Upper Extremity Assessment: Generalized weakness   Lower Extremity Assessment Lower Extremity Assessment: Defer to PT evaluation   Cervical / Trunk Assessment Cervical / Trunk Assessment: Kyphotic   Communication Communication Communication: No difficulties   Cognition Arousal/Alertness: Awake/alert Behavior During Therapy: WFL for tasks assessed/performed Overall Cognitive Status: Within Functional Limits for tasks assessed                                     General Comments  Edema noted in BLE. Patient reports this is 2/2 chemo and is better now compared to several days ago.    Exercises     Shoulder Instructions      Home Living Family/patient expects to be discharged to:: Private residence Living Arrangements: Alone Available Help at Discharge: Family;Available PRN/intermittently Type of Home: House Home Access: Stairs to enter [employer] of Steps: 5 Entrance Stairs-Rails: Right Home Layout: Two level Alternate Level Stairs-Number of Steps: 14-16 Alternate Level Stairs-Rails: Right;Left (Only on R midway.) Bathroom Shower/Tub: Teacher, early years/pre: Standard Bathroom Accessibility: Yes   Home Equipment: Shower seat - built in;Walker - 2 wheels;Bedside commode;Other (comment) (smalll base QC)          Prior Functioning/Environment Level of Independence: Independent with assistive device(s)        Comments: Patient reports use of small based QC and RW in home. Mod I with ADLs/IADLs. Can navigate stairs in home with Mod I and use of QC and rail. Patient is not currently driving.        OT Problem List: Decreased strength;Decreased activity tolerance;Impaired balance (sitting and/or standing)      OT Treatment/Interventions: Self-care/ADL training;Therapeutic  exercise;Energy conservation;Therapeutic activities;Patient/family education;Balance training    OT Goals(Current goals can be found in the care plan section) Acute Rehab OT Goals Patient Stated Goal: To return home independently. OT Goal Formulation: With patient Time For Goal Achievement: 05/15/21 Potential to Achieve Goals: Good ADL Goals Additional ADL Goal #1: Patient will complete morning ADLs with Mod I and AD with rest breaks as needed. Additional ADL Goal #2: Patient will recall and demo 3 energy conservation techniques in prep for safe d/c home.  OT Frequency: Min 2X/week   Barriers to D/C:            Co-evaluation              AM-PAC OT "6 Clicks" Daily Activity     Outcome Measure Help from another person eating meals?: None Help from another person taking care of personal grooming?: A Little Help from another person toileting, which includes using toliet, bedpan, or urinal?: A Little Help from another person bathing (including washing, rinsing, drying)?: A Little Help from another person to put on and taking off regular upper body clothing?: None Help from another person to put on and taking off regular lower body clothing?: A Little 6 Click Score: 20   End of Session Equipment Utilized During Treatment: Gait belt Nurse Communication: Mobility status  Activity Tolerance: Patient tolerated treatment well Patient left: in chair;with call bell/phone within reach  OT Visit Diagnosis: Unsteadiness on feet (R26.81);Muscle weakness (generalized) (M62.81)  Time: 0827-0902 OT Time Calculation (min): 35 min Charges:  OT General Charges $OT Visit: 1 Visit OT Evaluation $OT Eval Low Complexity: 1 Low OT Treatments $Self Care/Home Management : 8-22 mins  Wendy [name] H. OTR/L Supplemental OT, Department of rehab services [phone]  Arnet Hofferber R H. 05/01/2021, 10:55 AM

## 2021-05-01 NOTE — Evaluation (Signed)
Physical Therapy Evaluation Patient Details Name: Wendy [name] MRN: [id number] DOB: 13-Apr-1946 Today's Date: 05/01/2021   History of Present Illness  75 y.o. female presenting to ED with c/o SOB on the morning of 5/8. In ED patient diaphoretic and in significant respiratory distress. Patient diagnosed with flash pulmonary edema 2/2 chemo and heme (+) stool. Patient with previous hosptial admission 02/2021 and 01/2021 with extensive bilateral LE DVT/s and occlusive thrombus s/p thrombectomy. PMHx significant for HTN, HLD, T10 compression fx s/p kyphoplasty 2010, and multiple myeloma undergoing active chemo.  Clinical Impression  Pt presents to PT close to baseline level of functioning. From PT standpoint can return home and resume her HHPT services.     Follow Up Recommendations Home health PT (resume)    Equipment Recommendations  None recommended by PT    Recommendations for Other Services       Precautions / Restrictions Precautions Precautions: Fall      Mobility  Bed Mobility               General bed mobility comments: Pt up in chair    Transfers Overall transfer level: Modified independent Equipment used: None Transfers: Sit to/from Stand Sit to Stand: Modified independent (Device/Increase time)            Ambulation/Gait Ambulation/Gait assistance: Supervision Gait Distance (Feet): 300 Feet Assistive device: None Gait Pattern/deviations: Step-through pattern;Decreased stride length;Narrow base of support Gait velocity: decr Gait velocity interpretation: 1.31 - 2.62 ft/sec, indicative of limited community ambulator General Gait Details: Slightly unsteady but no loss of balance. Uses quad cane or rolling walker most of the time.  Stairs            Wheelchair Mobility    Modified Rankin (Stroke Patients Only)       Balance Overall balance assessment: Needs assistance Sitting-balance support: No upper extremity supported;Feet supported Sitting  balance-Leahy Scale: Good     Standing balance support: Single extremity supported;During functional activity Standing balance-Leahy Scale: Fair                               Pertinent Vitals/Pain Pain Assessment: No/denies pain    Home Living Family/patient expects to be discharged to:: Private residence Living Arrangements: Alone Available Help at Discharge: Family;Available PRN/intermittently Type of Home: House Home Access: Stairs to enter Entrance Stairs-Rails: Right Entrance Stairs-Number of Steps: 5 Home Layout: Two level Home Equipment: Shower seat - built in;Walker - 2 wheels;Bedside commode;Other (comment) (smalll base QC)      Prior Function Level of Independence: Independent with assistive device(s)         Comments: Patient reports use of small based QC and RW in home. Mod I with ADLs/IADLs. Can navigate stairs in home with Mod I and use of QC and rail. Patient is not currently driving.     Hand Dominance   Dominant Hand: Right    Extremity/Trunk Assessment   Upper Extremity Assessment Upper Extremity Assessment: Defer to OT evaluation    Lower Extremity Assessment Lower Extremity Assessment: Generalized weakness    Cervical / Trunk Assessment Cervical / Trunk Assessment: Kyphotic  Communication   Communication: No difficulties  Cognition Arousal/Alertness: Awake/alert Behavior During Therapy: WFL for tasks assessed/performed Overall Cognitive Status: Within Functional Limits for tasks assessed  General Comments General comments (skin integrity, edema, etc.): SpO2 95% with amb on RA    Exercises     Assessment/Plan    PT Assessment All further PT needs can be met in the next venue of care  PT Problem List Decreased strength;Decreased balance;Decreased mobility       PT Treatment Interventions      PT Goals (Current goals can be found in the Care Plan section)  Acute  Rehab PT Goals Patient Stated Goal: To return home independently. PT Goal Formulation: All assessment and education complete, DC therapy    Frequency     Barriers to discharge        Co-evaluation               AM-PAC PT "6 Clicks" Mobility  Outcome Measure Help needed turning from your back to your side while in a flat bed without using bedrails?: None Help needed moving from lying on your back to sitting on the side of a flat bed without using bedrails?: None Help needed moving to and from a bed to a chair (including a wheelchair)?: None Help needed standing up from a chair using your arms (e.g., wheelchair or bedside chair)?: None Help needed to walk in hospital room?: A Little Help needed climbing 3-5 steps with a railing? : A Little 6 Click Score: 22    End of Session   Activity Tolerance: Patient tolerated treatment well Patient left: in chair;with call bell/phone within reach   PT Visit Diagnosis: Other abnormalities of gait and mobility (R26.89);Muscle weakness (generalized) (M62.81)    Time: 5053-9767 PT Time Calculation (min) (ACUTE ONLY): 17 min   Charges:   PT Evaluation $PT Eval Low Complexity: 1 Low          [facility] PT Acute Rehabilitation Services Pager [phone] [facility] [place] 05/01/2021, 1:59 PM

## 2021-05-02 ENCOUNTER — Telehealth: Payer: Self-pay

## 2021-05-02 NOTE — Telephone Encounter (Signed)
Transition Care Management Follow-up Telephone Call  Date of discharge and from where: [facility]/20[facility]   How have you been since you were released from the hospital? Patient states she is feeling better.  Any questions or concerns? No  Items Reviewed:  Did the pt receive and understand the discharge instructions provided? Yes   Medications obtained and verified? Yes   Other? No   Any new allergies since your discharge? No   Dietary orders reviewed? Yes  Do you have support at home? Yes   Home Care and Equipment/Supplies: Were home health services ordered? not applicable If so, what is the name of the agency? N/A  Has the agency set up a time to come to the patient's home? not applicable Were any new equipment or medical supplies ordered?  No What is the name of the medical supply agency? N/A Were you able to get the supplies/equipment? not applicable Do you have any questions related to the use of the equipment or supplies? No  Functional Questionnaire: (I = Independent and D = Dependent) ADLs: I  Bathing/Dressing- I  Meal Prep- I  Eating- I  Maintaining continence- I  Transferring/Ambulation- I  Managing Meds- I  Follow up appointments reviewed:   PCP Hospital f/u appt confirmed? No  Patient wants to follow up with oncology on Thursday and see what they say first.  [facility] f/u appt confirmed? Yes  Scheduled to see oncology on 05/04/2021.  Are transportation arrangements needed? No   If their condition worsens, is the pt aware to call PCP or go to the Emergency Dept.? Yes  Was the patient provided with contact information for the PCP's office or ED? Yes  Was to pt encouraged to call back with questions or concerns? Yes

## 2021-05-03 NOTE — Progress Notes (Signed)
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   #Supportive Care --chemotherapy education complete --zofran [id number] PRN and compazine 1068mO q6H for nausea --acyclovir 400m66m BID for VCZ prophylaxis --albuterol for possible bronchospasm with daratumumab --recommend PPI therapy for stomach protection from steroids --Patient declines port at this time -- no pain medication required at this time.   No orders of the defined types were placed in this encounter.  All questions were answered. The patient knows to call the clinic with any problems, questions or concerns.  A total of more than 30  minutes were spent on this encounter and over half of that time was spent on counseling and coordination of care as outlined above.   Ledell Peoples, MD Department of Hematology/Oncology [facility] Phone: [phone] Pager: [phone] Email: [email or web address]  05/04/2021 3:33 PM

## 2021-05-04 ENCOUNTER — Inpatient Hospital Stay: Payer: Medicare Other

## 2021-05-04 ENCOUNTER — Inpatient Hospital Stay (HOSPITAL_BASED_OUTPATIENT_CLINIC_OR_DEPARTMENT_OTHER): Payer: Medicare Other | Admitting: Hematology and Oncology

## 2021-05-04 ENCOUNTER — Other Ambulatory Visit: Payer: Self-pay

## 2021-05-04 ENCOUNTER — Encounter: Payer: Self-pay | Admitting: Hematology and Oncology

## 2021-05-04 VITALS — BP 118/58 | HR 69 | Temp 98.7°F | Resp 17 | Wt 122.2 lb

## 2021-05-04 DIAGNOSIS — Z5112 Encounter for antineoplastic immunotherapy: Secondary | ICD-10-CM | POA: Diagnosis present

## 2021-05-04 DIAGNOSIS — E8581 Light chain (AL) amyloidosis: Secondary | ICD-10-CM

## 2021-05-04 DIAGNOSIS — Z79899 Other long term (current) drug therapy: Secondary | ICD-10-CM | POA: Diagnosis not present

## 2021-05-04 DIAGNOSIS — R803 Bence Jones proteinuria: Secondary | ICD-10-CM | POA: Diagnosis not present

## 2021-05-04 DIAGNOSIS — Z5111 Encounter for antineoplastic chemotherapy: Secondary | ICD-10-CM | POA: Diagnosis present

## 2021-05-04 LAB — CBC WITH DIFFERENTIAL (CANCER CENTER ONLY)
Abs Immature Granulocytes: 0.01 10*3/uL (ref 0.00–0.07)
Basophils Absolute: 0 10*3/uL (ref 0.0–0.1)
Basophils Relative: 2 %
Eosinophils Absolute: 0 10*3/uL (ref 0.0–0.5)
Eosinophils Relative: 3 %
HCT: 32.5 % — ABNORMAL LOW (ref 36.0–46.0)
Hemoglobin: 11.3 g/dL — ABNORMAL LOW (ref 12.0–15.0)
Immature Granulocytes: 1 %
Lymphocytes Relative: 11 %
Lymphs Abs: 0.2 10*3/uL — ABNORMAL LOW (ref 0.7–4.0)
MCH: 33.1 pg (ref 26.0–34.0)
MCHC: 34.8 g/dL (ref 30.0–36.0)
MCV: 95.3 fL (ref 80.0–100.0)
Monocytes Absolute: 0.3 10*3/uL (ref 0.1–1.0)
Monocytes Relative: 17 %
Neutro Abs: 1.1 10*3/uL — ABNORMAL LOW (ref 1.7–7.7)
Neutrophils Relative %: 66 %
Platelet Count: 262 10*3/uL (ref 150–400)
RBC: 3.41 MIL/uL — ABNORMAL LOW (ref 3.87–5.11)
RDW: 16.1 % — ABNORMAL HIGH (ref 11.5–15.5)
WBC Count: 1.6 10*3/uL — ABNORMAL LOW (ref 4.0–10.5)
nRBC: 0 % (ref 0.0–0.2)

## 2021-05-04 LAB — CMP (CANCER CENTER ONLY)
ALT: 9 U/L (ref 0–44)
AST: 14 U/L — ABNORMAL LOW (ref 15–41)
Albumin: 2.6 g/dL — ABNORMAL LOW (ref 3.5–5.0)
Alkaline Phosphatase: 70 U/L (ref 38–126)
Anion gap: 10 (ref 5–15)
BUN: 16 mg/dL (ref 8–23)
CO2: 29 mmol/L (ref 22–32)
Calcium: 8.8 mg/dL — ABNORMAL LOW (ref 8.9–10.3)
Chloride: 93 mmol/L — ABNORMAL LOW (ref 98–111)
Creatinine: 0.82 mg/dL (ref 0.44–1.00)
GFR, Estimated: >60 mL/min (ref >60)
Glucose, Bld: 129 mg/dL — ABNORMAL HIGH (ref 70–99)
Potassium: 3.1 mmol/L — ABNORMAL LOW (ref 3.5–5.1)
Sodium: 132 mmol/L — ABNORMAL LOW (ref 135–145)
Total Bilirubin: 0.5 mg/dL (ref 0.3–1.2)
Total Protein: 5.1 g/dL — ABNORMAL LOW (ref 6.5–8.1)

## 2021-05-04 LAB — LACTATE DEHYDROGENASE: LDH: 243 U/L — ABNORMAL HIGH (ref 98–192)

## 2021-05-04 MED ORDER — DEXAMETHASONE 4 MG PO TABS
10.0000 mg | ORAL_TABLET | Freq: Once | ORAL | Status: AC
  Administered 2021-05-04: 10 mg via ORAL

## 2021-05-04 MED ORDER — SODIUM CHLORIDE 0.9 % IV SOLN
300.0000 mg/m2 | Freq: Once | INTRAVENOUS | Status: AC
  Administered 2021-05-04: 480 mg via INTRAVENOUS
  Filled 2021-05-04 (×2): qty 24

## 2021-05-04 MED ORDER — DEXAMETHASONE 4 MG PO TABS
ORAL_TABLET | ORAL | Status: AC
  Filled 2021-05-04: qty 3

## 2021-05-04 MED ORDER — SODIUM CHLORIDE 0.9 % IV SOLN
Freq: Once | INTRAVENOUS | Status: AC
Start: 2021-05-04 — End: 2021-05-04
  Filled 2021-05-04: qty 250

## 2021-05-04 NOTE — Progress Notes (Signed)
Per Dr. Dorsey, ok to treat with ANC 1.1 °

## 2021-05-05 LAB — CULTURE, BLOOD (ROUTINE X 2)
Culture: NO GROWTH
Culture: NO GROWTH

## 2021-05-08 LAB — UPEP/UIFE/LIGHT CHAINS/TP, 24-HR UR
% BETA, Urine: 9.2 %
ALPHA 1 URINE: 9.7 %
Albumin, U: 72.9 %
Alpha 2, Urine: 7.4 %
Free Kappa Lt Chains,Ur: 6.1 mg/L (ref 1.17–86.46)
Free Kappa/Lambda Ratio: 0.34 — ABNORMAL LOW (ref 1.83–14.26)
Free Lambda Lt Chains,Ur: 17.78 mg/L — ABNORMAL HIGH (ref 0.27–15.21)
GAMMA GLOBULIN URINE: 0.8 %
Total Protein, Urine-Ur/day: 1335 mg/24 hr — ABNORMAL HIGH (ref 30–150)
Total Protein, Urine: 178 mg/dL
Total Volume: 750

## 2021-05-08 NOTE — Telephone Encounter (Signed)
Made note in NV note to advise pt on vit D.

## 2021-05-08 NOTE — Telephone Encounter (Signed)
CrCl is 51.05 mL/min and Ca is 8.8 which is slightly low.  Pt has NV for tomorrow to receive Prolia. Sending msg to PCP for authorization to give Prolia

## 2021-05-08 NOTE — Telephone Encounter (Signed)
Ok to give prolia  Please make sure she is taking her vitamin D as well

## 2021-05-09 ENCOUNTER — Ambulatory Visit (INDEPENDENT_AMBULATORY_CARE_PROVIDER_SITE_OTHER): Payer: Medicare Other

## 2021-05-09 ENCOUNTER — Other Ambulatory Visit: Payer: Self-pay

## 2021-05-09 DIAGNOSIS — M810 Age-related osteoporosis without current pathological fracture: Secondary | ICD-10-CM | POA: Diagnosis not present

## 2021-05-09 MED ORDER — DENOSUMAB 60 MG/ML ~~LOC~~ SOSY
60.0000 mg | PREFILLED_SYRINGE | Freq: Once | SUBCUTANEOUS | Status: AC
  Administered 2021-05-09: 60 mg via SUBCUTANEOUS

## 2021-05-09 NOTE — Progress Notes (Signed)
Per orders of Dr. Glori Bickers, injection of Prolia given by Pilar Grammes, CMA in right arm. Patient tolerated injection well.  She said she is taking her Vitamin D twice daily.

## 2021-05-11 ENCOUNTER — Other Ambulatory Visit: Payer: Self-pay | Admitting: Hematology and Oncology

## 2021-05-11 ENCOUNTER — Other Ambulatory Visit: Payer: Self-pay | Admitting: *Deleted

## 2021-05-11 ENCOUNTER — Inpatient Hospital Stay: Payer: Medicare Other

## 2021-05-11 ENCOUNTER — Other Ambulatory Visit: Payer: Self-pay

## 2021-05-11 VITALS — BP 115/62 | HR 72 | Temp 97.8°F | Resp 16 | Wt 119.2 lb

## 2021-05-11 DIAGNOSIS — Z5111 Encounter for antineoplastic chemotherapy: Secondary | ICD-10-CM | POA: Diagnosis not present

## 2021-05-11 DIAGNOSIS — E8581 Light chain (AL) amyloidosis: Secondary | ICD-10-CM

## 2021-05-11 LAB — CBC WITH DIFFERENTIAL (CANCER CENTER ONLY)
Abs Immature Granulocytes: 0.02 10*3/uL (ref 0.00–0.07)
Basophils Absolute: 0 10*3/uL (ref 0.0–0.1)
Basophils Relative: 1 %
Eosinophils Absolute: 0 10*3/uL (ref 0.0–0.5)
Eosinophils Relative: 1 %
HCT: 31.9 % — ABNORMAL LOW (ref 36.0–46.0)
Hemoglobin: 10.8 g/dL — ABNORMAL LOW (ref 12.0–15.0)
Immature Granulocytes: 1 %
Lymphocytes Relative: 6 %
Lymphs Abs: 0.3 10*3/uL — ABNORMAL LOW (ref 0.7–4.0)
MCH: 33.1 pg (ref 26.0–34.0)
MCHC: 33.9 g/dL (ref 30.0–36.0)
MCV: 97.9 fL (ref 80.0–100.0)
Monocytes Absolute: 0.4 10*3/uL (ref 0.1–1.0)
Monocytes Relative: 9 %
Neutro Abs: 3.6 10*3/uL (ref 1.7–7.7)
Neutrophils Relative %: 82 %
Platelet Count: 268 10*3/uL (ref 150–400)
RBC: 3.26 MIL/uL — ABNORMAL LOW (ref 3.87–5.11)
RDW: 16.4 % — ABNORMAL HIGH (ref 11.5–15.5)
WBC Count: 4.4 10*3/uL (ref 4.0–10.5)
nRBC: 0 % (ref 0.0–0.2)

## 2021-05-11 LAB — CMP (CANCER CENTER ONLY)
ALT: 6 U/L (ref 0–44)
AST: 16 U/L (ref 15–41)
Albumin: 2.6 g/dL — ABNORMAL LOW (ref 3.5–5.0)
Alkaline Phosphatase: 65 U/L (ref 38–126)
Anion gap: 12 (ref 5–15)
BUN: 16 mg/dL (ref 8–23)
CO2: 29 mmol/L (ref 22–32)
Calcium: 8.2 mg/dL — ABNORMAL LOW (ref 8.9–10.3)
Chloride: 96 mmol/L — ABNORMAL LOW (ref 98–111)
Creatinine: 0.79 mg/dL (ref 0.44–1.00)
GFR, Estimated: >60 mL/min (ref >60)
Glucose, Bld: 99 mg/dL (ref 70–99)
Potassium: 3.2 mmol/L — ABNORMAL LOW (ref 3.5–5.1)
Sodium: 137 mmol/L (ref 135–145)
Total Bilirubin: 0.5 mg/dL (ref 0.3–1.2)
Total Protein: 5.2 g/dL — ABNORMAL LOW (ref 6.5–8.1)

## 2021-05-11 LAB — LACTATE DEHYDROGENASE: LDH: 235 U/L — ABNORMAL HIGH (ref 98–192)

## 2021-05-11 MED ORDER — ACETAMINOPHEN 325 MG PO TABS
650.0000 mg | ORAL_TABLET | Freq: Once | ORAL | Status: AC
  Administered 2021-05-11: 650 mg via ORAL

## 2021-05-11 MED ORDER — CYCLOPHOSPHAMIDE CHEMO INJECTION 1 GM
300.0000 mg/m2 | Freq: Once | INTRAMUSCULAR | Status: AC
  Administered 2021-05-11: 480 mg via INTRAVENOUS
  Filled 2021-05-11: qty 24

## 2021-05-11 MED ORDER — SODIUM CHLORIDE 0.9 % IV SOLN
INTRAVENOUS | Status: DC
  Filled 2021-05-11: qty 250

## 2021-05-11 MED ORDER — DARATUMUMAB-HYALURONIDASE-FIHJ 1800-30000 MG-UT/15ML ~~LOC~~ SOLN
1800.0000 mg | Freq: Once | SUBCUTANEOUS | Status: AC
  Administered 2021-05-11: 1800 mg via SUBCUTANEOUS
  Filled 2021-05-11: qty 15

## 2021-05-11 MED ORDER — DEXAMETHASONE 4 MG PO TABS
10.0000 mg | ORAL_TABLET | Freq: Once | ORAL | Status: AC
Start: 2021-05-11 — End: 2021-05-11
  Administered 2021-05-11: 10 mg via ORAL

## 2021-05-11 MED ORDER — ACETAMINOPHEN 325 MG PO TABS
ORAL_TABLET | ORAL | Status: AC
  Filled 2021-05-11: qty 2

## 2021-05-11 MED ORDER — POTASSIUM CHLORIDE CRYS ER 20 MEQ PO TBCR: 20.0000 meq | EXTENDED_RELEASE_TABLET | Freq: Two times a day (BID) | ORAL | 1 refills | Status: DC

## 2021-05-11 MED ORDER — DEXAMETHASONE 4 MG PO TABS
ORAL_TABLET | ORAL | Status: AC
  Filled 2021-05-11: qty 3

## 2021-05-11 NOTE — Progress Notes (Signed)
Potassium

## 2021-05-11 NOTE — Patient Instructions (Signed)
[facility]    Discharge Instructions: Thank you for choosing [facility] to provide your oncology and hematology care.   If you have a lab appointment with the [facility], please go directly to the [facility] and check in at the registration area.   Wear comfortable clothing and clothing appropriate for easy access to any Portacath or PICC line.   We strive to give you quality time with your provider. You may need to reschedule your appointment if you arrive late (15 or more minutes).  Arriving late affects you and other patients whose appointments are after yours.  Also, if you miss three or more appointments without notifying the office, you may be dismissed from the clinic at the provider's discretion.      For prescription refill requests, have your pharmacy contact our office and allow 72 hours for refills to be completed.    Today you received the following chemotherapy and/or immunotherapy agents: daratumumab/hyaluronidase and cyclophosphamide.     To help prevent nausea and vomiting after your treatment, we encourage you to take your nausea medication as directed.  BELOW ARE SYMPTOMS THAT SHOULD BE REPORTED IMMEDIATELY: . *FEVER GREATER THAN 100.4 F (38 C) OR HIGHER . *CHILLS OR SWEATING . *NAUSEA AND VOMITING THAT IS NOT CONTROLLED WITH YOUR NAUSEA MEDICATION . *UNUSUAL SHORTNESS OF BREATH . *UNUSUAL BRUISING OR BLEEDING . *URINARY PROBLEMS (pain or burning when urinating, or frequent urination) . *BOWEL PROBLEMS (unusual diarrhea, constipation, pain near the anus) . TENDERNESS IN MOUTH AND THROAT WITH OR WITHOUT PRESENCE OF ULCERS (sore throat, sores in mouth, or a toothache) . UNUSUAL RASH, SWELLING OR PAIN  . UNUSUAL VAGINAL DISCHARGE OR ITCHING   Items with * indicate a potential emergency and should be followed up as soon as possible or go to the Emergency Department if any problems should occur.  Please show the  CHEMOTHERAPY ALERT CARD or IMMUNOTHERAPY ALERT CARD at check-in to the Emergency Department and triage nurse.  Should you have questions after your visit or need to cancel or reschedule your appointment, please contact [facility]  Dept: [phone]  and follow the prompts.  Office hours are 8:00 a.m. to 4:30 p.m. Monday - Friday. Please note that voicemails left after 4:00 p.m. may not be returned until the following business day.  We are closed weekends and major holidays. You have access to a nurse at all times for urgent questions. Please call the main number to the clinic Dept: [phone] and follow the prompts.   For any non-urgent questions, you may also contact your provider using MyChart. We now offer e-Visits for anyone 46 and older to request care online for non-urgent symptoms. For details visit [email or web address].   Also download the MyChart app! Go to the app store, search "MyChart", open the app, select [facility], and log in with your MyChart username and password.  Due to Covid, a mask is required upon entering the hospital/clinic. If you do not have a mask, one will be given to you upon arrival. For doctor visits, patients may have 1 support person aged 74 or older with them. For treatment visits, patients cannot have anyone with them due to current Covid guidelines and our immunocompromised population.

## 2021-05-12 ENCOUNTER — Telehealth: Payer: Self-pay | Admitting: Family Medicine

## 2021-05-12 NOTE — Telephone Encounter (Signed)
Thanks - do not know how to change this

## 2021-05-12 NOTE — Telephone Encounter (Signed)
Will route to Larene Beach, RN to f/u on this   FYI to PCP so she is aware of pt's request

## 2021-05-12 NOTE — Telephone Encounter (Signed)
Pt called in wanted to know about getting a DNR removed due to it was to broad for it.    Please advise

## 2021-05-12 NOTE — Telephone Encounter (Signed)
[facility] - Client Nonclinical Telephone Record AccessNurse Client [facility] Night - Client Client Site [facility] Physician Loura Pardon - MD Contact Type Call Who Is Calling Patient / Member / Family / Caregiver Caller Name [place] Phone Number [phone] Patient Name Wendy [name] Patient DOB 27-Sep-1946 Call Type Message Only Information Provided Reason for Call Request for General Office Information Initial Comment Caller states she was in the hospital for a couple of days. She signed a DNR in the hospital. She wants that request rescinded. Disp. Time Disposition Final User 05/11/2021 5:19:45 PM General Information Provided Yes Jowers, April Call Closed By: April Jowers Transaction Date/Time: 05/11/2021 5:12:25 PM (ET)

## 2021-05-12 NOTE — Telephone Encounter (Signed)
Pt left v/m requesting cb when DNR cancelled.

## 2021-05-18 ENCOUNTER — Other Ambulatory Visit: Payer: Self-pay

## 2021-05-18 ENCOUNTER — Inpatient Hospital Stay (HOSPITAL_BASED_OUTPATIENT_CLINIC_OR_DEPARTMENT_OTHER): Payer: Medicare Other | Admitting: Hematology and Oncology

## 2021-05-18 ENCOUNTER — Inpatient Hospital Stay: Payer: Medicare Other

## 2021-05-18 VITALS — BP 144/78 | HR 72 | Temp 98.5°F | Resp 17 | Ht 62.0 in | Wt 120.4 lb

## 2021-05-18 DIAGNOSIS — R803 Bence Jones proteinuria: Secondary | ICD-10-CM

## 2021-05-18 DIAGNOSIS — R1900 Intra-abdominal and pelvic swelling, mass and lump, unspecified site: Secondary | ICD-10-CM

## 2021-05-18 DIAGNOSIS — E8581 Light chain (AL) amyloidosis: Secondary | ICD-10-CM

## 2021-05-18 DIAGNOSIS — Z5111 Encounter for antineoplastic chemotherapy: Secondary | ICD-10-CM | POA: Diagnosis not present

## 2021-05-18 LAB — CBC WITH DIFFERENTIAL (CANCER CENTER ONLY)
Abs Immature Granulocytes: 0.03 10*3/uL (ref 0.00–0.07)
Basophils Absolute: 0 10*3/uL (ref 0.0–0.1)
Basophils Relative: 1 %
Eosinophils Absolute: 0.1 10*3/uL (ref 0.0–0.5)
Eosinophils Relative: 1 %
HCT: 32.4 % — ABNORMAL LOW (ref 36.0–46.0)
Hemoglobin: 11 g/dL — ABNORMAL LOW (ref 12.0–15.0)
Immature Granulocytes: 1 %
Lymphocytes Relative: 8 %
Lymphs Abs: 0.4 10*3/uL — ABNORMAL LOW (ref 0.7–4.0)
MCH: 33.5 pg (ref 26.0–34.0)
MCHC: 34 g/dL (ref 30.0–36.0)
MCV: 98.8 fL (ref 80.0–100.0)
Monocytes Absolute: 0.4 10*3/uL (ref 0.1–1.0)
Monocytes Relative: 10 %
Neutro Abs: 3.4 10*3/uL (ref 1.7–7.7)
Neutrophils Relative %: 79 %
Platelet Count: 312 10*3/uL (ref 150–400)
RBC: 3.28 MIL/uL — ABNORMAL LOW (ref 3.87–5.11)
RDW: 17.3 % — ABNORMAL HIGH (ref 11.5–15.5)
WBC Count: 4.3 10*3/uL (ref 4.0–10.5)
nRBC: 0 % (ref 0.0–0.2)

## 2021-05-18 LAB — CMP (CANCER CENTER ONLY)
ALT: 9 U/L (ref 0–44)
AST: 14 U/L — ABNORMAL LOW (ref 15–41)
Albumin: 2.8 g/dL — ABNORMAL LOW (ref 3.5–5.0)
Alkaline Phosphatase: 67 U/L (ref 38–126)
Anion gap: 8 (ref 5–15)
BUN: 16 mg/dL (ref 8–23)
CO2: 25 mmol/L (ref 22–32)
Calcium: 8.1 mg/dL — ABNORMAL LOW (ref 8.9–10.3)
Chloride: 102 mmol/L (ref 98–111)
Creatinine: 0.75 mg/dL (ref 0.44–1.00)
GFR, Estimated: >60 mL/min (ref >60)
Glucose, Bld: 82 mg/dL (ref 70–99)
Potassium: 5.2 mmol/L — ABNORMAL HIGH (ref 3.5–5.1)
Sodium: 135 mmol/L (ref 135–145)
Total Bilirubin: 0.6 mg/dL (ref 0.3–1.2)
Total Protein: 4.9 g/dL — ABNORMAL LOW (ref 6.5–8.1)

## 2021-05-18 LAB — LACTATE DEHYDROGENASE: LDH: 263 U/L — ABNORMAL HIGH (ref 98–192)

## 2021-05-18 MED ORDER — DEXAMETHASONE 4 MG PO TABS
ORAL_TABLET | ORAL | Status: AC
  Filled 2021-05-18: qty 3

## 2021-05-18 MED ORDER — SODIUM CHLORIDE 0.9 % IV SOLN
300.0000 mg/m2 | Freq: Once | INTRAVENOUS | Status: AC
  Administered 2021-05-18: 480 mg via INTRAVENOUS
  Filled 2021-05-18: qty 24

## 2021-05-18 MED ORDER — DEXAMETHASONE 4 MG PO TABS
10.0000 mg | ORAL_TABLET | Freq: Once | ORAL | Status: AC
  Administered 2021-05-18: 10 mg via ORAL

## 2021-05-18 MED ORDER — SODIUM CHLORIDE 0.9 % IV SOLN
Freq: Once | INTRAVENOUS | Status: AC
  Filled 2021-05-18: qty 250

## 2021-05-18 NOTE — Patient Instructions (Signed)
[facility]  Discharge Instructions: Thank you for choosing [facility] to provide your oncology and hematology care.   If you have a lab appointment with the [facility], please go directly to the [facility] and check in at the registration area.   Wear comfortable clothing and clothing appropriate for easy access to any Portacath or PICC line.   We strive to give you quality time with your provider. You may need to reschedule your appointment if you arrive late (15 or more minutes).  Arriving late affects you and other patients whose appointments are after yours.  Also, if you miss three or more appointments without notifying the office, you may be dismissed from the clinic at the provider's discretion.      For prescription refill requests, have your pharmacy contact our office and allow 72 hours for refills to be completed.    Today you received the following chemotherapy and/or immunotherapy agents Cytoxan    To help prevent nausea and vomiting after your treatment, we encourage you to take your nausea medication as directed.  BELOW ARE SYMPTOMS THAT SHOULD BE REPORTED IMMEDIATELY: . *FEVER GREATER THAN 100.4 F (38 C) OR HIGHER . *CHILLS OR SWEATING . *NAUSEA AND VOMITING THAT IS NOT CONTROLLED WITH YOUR NAUSEA MEDICATION . *UNUSUAL SHORTNESS OF BREATH . *UNUSUAL BRUISING OR BLEEDING . *URINARY PROBLEMS (pain or burning when urinating, or frequent urination) . *BOWEL PROBLEMS (unusual diarrhea, constipation, pain near the anus) . TENDERNESS IN MOUTH AND THROAT WITH OR WITHOUT PRESENCE OF ULCERS (sore throat, sores in mouth, or a toothache) . UNUSUAL RASH, SWELLING OR PAIN  . UNUSUAL VAGINAL DISCHARGE OR ITCHING   Items with * indicate a potential emergency and should be followed up as soon as possible or go to the Emergency Department if any problems should occur.  Please show the CHEMOTHERAPY ALERT CARD or IMMUNOTHERAPY ALERT CARD  at check-in to the Emergency Department and triage nurse.  Should you have questions after your visit or need to cancel or reschedule your appointment, please contact [facility]  Dept: [phone]  and follow the prompts.  Office hours are 8:00 a.m. to 4:30 p.m. Monday - Friday. Please note that voicemails left after 4:00 p.m. may not be returned until the following business day.  We are closed weekends and major holidays. You have access to a nurse at all times for urgent questions. Please call the main number to the clinic Dept: [phone] and follow the prompts.   For any non-urgent questions, you may also contact your provider using MyChart. We now offer e-Visits for anyone 77 and older to request care online for non-urgent symptoms. For details visit [email or web address].   Also download the MyChart app! Go to the app store, search "MyChart", open the app, select [facility], and log in with your MyChart username and password.  Due to Covid, a mask is required upon entering the hospital/clinic. If you do not have a mask, one will be given to you upon arrival. For doctor visits, patients may have 1 support person aged 77 or older with them. For treatment visits, patients cannot have anyone with them due to current Covid guidelines and our immunocompromised population.

## 2021-05-18 NOTE — Progress Notes (Signed)
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 daratumumab/cyclophosphamide with decreased dex. Holding Velcade.  --currently on anticoagulation given the recent findings of foreign body in the IVC and subsequent DVTs --further decreased dexamethasone to 71m PO weekly --continue Bumex per nephrology. 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   #Supportive Care --chemotherapy education complete --zofran 81mq8H PRN and compazine 1052mO q6H for nausea --acyclovir 400m95m BID for VCZ prophylaxis --albuterol for possible bronchospasm with daratumumab --recommend PPI therapy for stomach protection from steroids --Patient declines port at this time -- no pain medication required at this time.   No orders of the defined types were placed in this encounter.  All questions were answered. The patient knows to call the clinic with any problems, questions or concerns.  A  total of more than 30 minutes were spent on this  encounter and over half of that time was spent on counseling and coordination of care as outlined above.   Ledell Peoples, MD Department of Hematology/Oncology [place] at [facility] Phone: [phone] Pager: [phone] Email: [email or web address]  05/18/2021 5:32 PM

## 2021-05-19 NOTE — Telephone Encounter (Signed)
Pt is due for a hospital f/u. During the hospital f/u the revocation of the DNR can be discussed and then be removed from the chart. Pt will need to be contacted to schedule a hospital f/u

## 2021-05-24 NOTE — Telephone Encounter (Signed)
Called mrs. Burleigh and got her scheduled for 6/8 @2 

## 2021-05-25 ENCOUNTER — Ambulatory Visit (INDEPENDENT_AMBULATORY_CARE_PROVIDER_SITE_OTHER): Payer: Medicare Other | Admitting: Pulmonary Disease

## 2021-05-25 ENCOUNTER — Encounter: Payer: Self-pay | Admitting: Pulmonary Disease

## 2021-05-25 VITALS — BP 116/68 | HR 80 | Temp 98.1°F | Ht 62.0 in | Wt 118.4 lb

## 2021-05-25 DIAGNOSIS — R918 Other nonspecific abnormal finding of lung field: Secondary | ICD-10-CM

## 2021-05-25 NOTE — Progress Notes (Signed)
Subjective:   PATIENT ID: Wendy [name] GENDER: female DOB: 02-09-1946, MRN: [id number]   HPI  Chief Complaint  Patient presents with   Hospitalization Follow-up    Reason for Visit: Follow-up   Ms. 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 Her daughter is present with her.   She was seen by Pulmonary while inpatient in Feb 2022. She is actively undergoing chemotherapy for her AL amyloidosis. Respiratory symptoms thought to be secondary to Velcade so this was held.  Since discharge she has been hospitalized in March for bilateral LE DVT with extension to IVC and in May for flash pulmonary edema that responded to diuretics  She was seen by Dr. Lorenso Courier, her oncologist, on 05/18/21. Note reviewed. Has tolerated chemotherapy. She reports shortness of breath with exertion including when walking upstairs. She is able to complete the flight but will feel out of breath at the top. She had worked with physical therapy for 8 weeks, once a week. Shortness of breath improves with bumex which she takes daily   Social History: Former smoker. Quit >25 years ago. 32 pack-years. Fellow [place]  Environmental exposures: Chemotherapy  I have personally reviewed patient's past medical/family/social history, allergies, current medications.  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 achieved remission ([place])    Osteoporosis      Family History  Problem Relation Age of Onset   Alcohol abuse Mother    Lung cancer Mother 28       Lung (not entirely sure), Smoker, Drinker   Alcohol abuse Father     Hyperlipidemia Father    Heart disease Father 33       MI   Heart disease Paternal Grandfather        MI   Colon cancer Neg Hx    AAA (abdominal aortic aneurysm) Neg Hx    Stomach cancer Neg Hx    Breast cancer Neg Hx    Esophageal cancer Neg Hx    Rectal cancer Neg Hx      Social History   Occupational History   Occupation: Takes care of Toddlers    Employer: RETIRED  Tobacco Use   Smoking status: Former Smoker    Packs/day: 1.00    Years: 32.00    Pack years: 32.00    Types: Cigarettes    Quit date: 12/24/1993    Years since quitting: 27.4   Smokeless tobacco: Never Used  Scientific laboratory technician Use: Never used  Substance and Sexual Activity   Alcohol use: Not Currently    Alcohol/week: 7.0 - 10.0 standard drinks    Types: 7 - 10 Glasses of wine per week    Comment: 1 glasses of wine per day, none in months   Drug use: No   Sexual activity: Never    Allergies  Allergen Reactions   Bentyl [Dicyclomine Hcl] Other (See Comments)    Groggy, blurred vision   Butalbital-Aspirin-Caffeine Other (See Comments)    hallucinations  Clonidine Derivatives Other (See Comments)    dizziness, lightheadedness, abdominal cramping, dry mouth/throat   Linzess [Linaclotide] Diarrhea   Morphine And Related Other (See Comments)    Does not work   Motrin [Ibuprofen] Other (See Comments)    GI upset   Penicillins Other (See Comments)    As child; reaction unknown Has patient had a PCN reaction causing immediate rash, facial/tongue/throat swelling, SOB or lightheadedness with hypotension: No Has patient had a PCN reaction causing severe rash involving mucus membranes or skin necrosis: No Has patient had a PCN reaction that required hospitalization: No Has patient had a PCN reaction occurring within the last 10 years: No If all of the above answers are "NO", then may proceed with Cephalosporin use.   Sulfa Antibiotics Other (See Comments)    In childhood   Zanaflex [Tizanidine Hcl] Other  (See Comments)    Decreased BP   Diphenhydramine Hcl Palpitations    restlessness     Outpatient Medications Prior to Visit  Medication Sig Dispense Refill   acetaminophen (TYLENOL) 325 MG tablet Take 2 tablets (650 mg total) by mouth every 6 (six) hours as needed for mild pain, fever or headache.     acyclovir (ZOVIRAX) 400 MG tablet Take 1 tablet (400 mg total) by mouth 2 (two) times daily. 180 tablet 3   albuterol (VENTOLIN HFA) 108 (90 Base) MCG/ACT inhaler Inhale 2 puffs into the lungs every 6 (six) hours as needed for wheezing or shortness of breath. 8 g 0   alclomethasone (ACLOVATE) 0.05 % ointment Apply 1 application topically daily as needed (dry lips). 30 g 0   ALPRAZolam (XANAX) 0.5 MG tablet Take 0.5 tablets (0.25 mg total) by mouth daily as needed for anxiety or sleep. 30 tablet 1   apixaban (ELIQUIS) 5 MG TABS tablet TAKE 2 TABLETS (10 MG) BY MOUTH TWICE DAILY UNTIL 3/28; THEN ON 3/29, DECREASE TO 1 TABLET (5 MG) BY MOUTH TWICE DAILY (Patient taking differently: Take 5 mg by mouth 2 (two) times daily.) 67 tablet 0   bumetanide (BUMEX) 0.5 MG tablet Take 0.5 mg by mouth daily. Takes an additional tablet if needed     Calcium Carbonate Antacid (TUMS PO) Take 2-4 capsules by mouth daily as needed (heartburn).     Cholecalciferol (VITAMIN D) 2000 UNITS tablet Take 2,000 Units by mouth daily.     denosumab (PROLIA) 60 MG/ML SOSY injection Inject 60 mg into the skin every 6 (six) months.     esomeprazole (NEXIUM) 40 MG capsule Take 1 capsule (40 mg total) by mouth daily. 90 capsule 3   Melatonin 10 MG TABS Take 10 mg by mouth at bedtime.     metoprolol tartrate (LOPRESSOR) 25 MG tablet TAKE 1/2 TABLET (12.5 MG TOTAL) BY MOUTH TWO TIMES DAILY. (Patient taking differently: Take 12.5 mg by mouth 2 (two) times daily.) 60 tablet 1   potassium chloride SA (KLOR-CON) 20 MEQ tablet Take 1 tablet (20 mEq total) by mouth 2 (two) times daily. 60 tablet 1   prochlorperazine (COMPAZINE) 10 MG tablet  Take 1 tablet (10 mg total) by mouth every 6 (six) hours as needed for nausea or vomiting. 30 tablet 0   Propylene Glycol (SYSTANE BALANCE OP) Place 1 drop into both eyes 2 (two) times daily.     rosuvastatin (CRESTOR) 10 MG tablet Take 1 tablet (10 mg total) by mouth daily. 90 tablet 3   traMADol (ULTRAM) 50 MG tablet TAKE 1 TO 2 TABLETS(50 TO 100 MG) BY  MOUTH EVERY 6 HOURS AS NEEDED (Patient taking differently: Take by mouth daily as needed for moderate pain.) 30 tablet 0   zolpidem (AMBIEN) 10 MG tablet TAKE ONE TABLET BY MOUTH AT BEDTIME AS NEEDED FOR SLEEP (Patient taking differently: Take 10 mg by mouth at bedtime.) 30 tablet 3   No facility-administered medications prior to visit.    Review of Systems  Constitutional:  Negative for chills, diaphoresis, fever, malaise/fatigue and weight loss.  HENT:  Negative for congestion, ear pain and sore throat.   Respiratory:  Negative for cough, hemoptysis, sputum production, shortness of breath and wheezing.   Cardiovascular:  Negative for chest pain, palpitations and leg swelling.  Gastrointestinal:  Negative for abdominal pain, heartburn and nausea.  Genitourinary:  Negative for frequency.  Musculoskeletal:  Negative for joint pain and myalgias.  Skin:  Negative for itching and rash.  Neurological:  Negative for dizziness, weakness and headaches.  Endo/Heme/Allergies:  Does not bruise/bleed easily.  Psychiatric/Behavioral:  Negative for depression. The patient is not nervous/anxious.     Objective:   Vitals:   05/25/21 1435  BP: 116/68  Pulse: 80  Temp: 98.1 F (36.7 C)  TempSrc: Temporal  SpO2: 96%  Weight: 118 lb 6.4 oz (53.7 kg)  Height: '5\' 2"'  (1.575 m)      Physical Exam: General: Well-appearing, no acute distress HENT: [place], AT Eyes: EOMI, no scleral icterus Respiratory: Clear to auscultation bilaterally.  No crackles, wheezing or rales Cardiovascular: RRR, -M/R/G, no JVD Extremities:-Edema,-tenderness Neuro: AAO x4,  CNII-XII grossly intact Skin: Intact, no rashes or bruising Psych: Normal mood, normal affect  Data Reviewed:  Imaging: CTA 02/10/21 - Unchanged numerous subcentimeter pulmonary nodules <66m scattered bilaterally compared to 03/2020 imaging. Interval development of interstitial opacities with ground glass primarily present in the right lung. Small bilateral pleural effusions  PFT: None on file  Labs: CBC    Component Value Date/Time   WBC 5.8 06/08/2021 0818   WBC 4.4 05/01/2021 0347   RBC 2.99 (L) 06/08/2021 0818   HGB 10.4 (L) 06/08/2021 0818   HCT 30.0 (L) 06/08/2021 0818   PLT 302 06/08/2021 0818   MCV 100.3 (H) 06/08/2021 0818   MCH 34.8 (H) 06/08/2021 0818   MCHC 34.7 06/08/2021 0818   RDW 16.8 (H) 06/08/2021 0818   LYMPHSABS 0.4 (L) 06/08/2021 0818   MONOABS 0.4 06/08/2021 0818   EOSABS 0.1 06/08/2021 0818   BASOSABS 0.1 06/08/2021 0818   BMET    Component Value Date/Time   NA 133 (L) 06/08/2021 0818   NA 123 (L) 11/03/2020 1103   K 4.1 06/08/2021 0818   CL 99 06/08/2021 0818   CO2 22 06/08/2021 0818   GLUCOSE 104 (H) 06/08/2021 0818   BUN 18 06/08/2021 0818   BUN 10 11/03/2020 1103   CREATININE 0.89 06/08/2021 0818   CALCIUM 8.7 (L) 06/08/2021 0818   GFRNONAA >60 06/08/2021 0818   GFRAA 93 11/03/2020 1103   Imaging, labs and test noted above have been reviewed independently by me.      Assessment & Plan:   Discussion: 75year old female with primary AL amyloidosis on chemotherapy with first dose of daratumumab/bortezomib/Cyclophosphadmide/dexamethasone on 23/64complicated by pulmonary edema. Echocardiogram with normal EF, grade II DD and RVSP 42.9 mm Hg. Pulmonary initially consulted for abnormal CT.   Abnormal CT Hx pulmonary edema with GGO, small nodules --ORDER CT Chest without contrast  Addendum:   Health Maintenance Immunization History  Administered Date(s) Administered   Fluad Quad(high Dose  65+) 09/25/2019   Influenza, High Dose Seasonal  PF 10/12/2020   Influenza,inj,Quad PF,6+ Mos 09/29/2014, 09/14/2015, 09/18/2016, 09/19/2017, 10/15/2018   PFIZER(Purple Top)SARS-COV-2 Vaccination 01/09/2020, 01/30/2020, 09/21/2020   Pneumococcal Conjugate-13 07/20/2014   Pneumococcal Polysaccharide-23 05/27/2013   Td 12/25/2007, 02/22/2018   Tdap 01/14/2013   Zoster, Live 12/24/2006   CT Lung Screen - not indicated  Orders Placed This Encounter  Procedures   CT Chest Wo Contrast     MEDICARE PART A AND B/bcbs   Wt 118/No Needs/wear mask/come alone/No to all covid q's //No spinal cord/No body injector/no glucose mon//ab w/sherry'@[facility]' /epic order Please remember if you need to cancel your appt, please do so 24 hours prior to your appointment to avoid getting charged a no-show fee of $75.00 pt is aware    Standing Status:   Future    Number of Occurrences:   1    Standing Expiration Date:   05/25/2022    Scheduling Instructions:     Per JE, to completed at any time.    Order Specific Question:   Preferred imaging location?    Answer:   [id number]  No orders of the defined types were placed in this encounter.   No follow-ups on file.  I have spent a total time of 31-minutes on the day of the appointment reviewing prior documentation, coordinating care and discussing medical diagnosis and plan with the patient/family. Imaging, labs and tests included in this note have been reviewed and interpreted independently by me.  Beecher, MD [facility] 05/25/2021 2:33 PM  Office Number [phone]

## 2021-05-25 NOTE — Patient Instructions (Addendum)
Abnormal CT --ORDER CT Chest without contrast

## 2021-05-26 ENCOUNTER — Other Ambulatory Visit: Payer: Self-pay

## 2021-05-26 ENCOUNTER — Inpatient Hospital Stay: Payer: Medicare Other

## 2021-05-26 ENCOUNTER — Inpatient Hospital Stay: Payer: Medicare Other | Attending: Hematology and Oncology

## 2021-05-26 VITALS — BP 130/63 | HR 76 | Temp 98.2°F | Resp 16

## 2021-05-26 DIAGNOSIS — E8581 Light chain (AL) amyloidosis: Secondary | ICD-10-CM | POA: Insufficient documentation

## 2021-05-26 DIAGNOSIS — Z5112 Encounter for antineoplastic immunotherapy: Secondary | ICD-10-CM | POA: Diagnosis present

## 2021-05-26 DIAGNOSIS — Z5111 Encounter for antineoplastic chemotherapy: Secondary | ICD-10-CM | POA: Diagnosis present

## 2021-05-26 DIAGNOSIS — Z79899 Other long term (current) drug therapy: Secondary | ICD-10-CM | POA: Insufficient documentation

## 2021-05-26 LAB — CMP (CANCER CENTER ONLY)
ALT: 8 U/L (ref 0–44)
AST: 16 U/L (ref 15–41)
Albumin: 2.7 g/dL — ABNORMAL LOW (ref 3.5–5.0)
Alkaline Phosphatase: 63 U/L (ref 38–126)
Anion gap: 8 (ref 5–15)
BUN: 21 mg/dL (ref 8–23)
CO2: 24 mmol/L (ref 22–32)
Calcium: 8.5 mg/dL — ABNORMAL LOW (ref 8.9–10.3)
Chloride: 99 mmol/L (ref 98–111)
Creatinine: 0.8 mg/dL (ref 0.44–1.00)
GFR, Estimated: >60 mL/min (ref >60)
Glucose, Bld: 97 mg/dL (ref 70–99)
Potassium: 4.1 mmol/L (ref 3.5–5.1)
Sodium: 131 mmol/L — ABNORMAL LOW (ref 135–145)
Total Bilirubin: 0.5 mg/dL (ref 0.3–1.2)
Total Protein: 5.4 g/dL — ABNORMAL LOW (ref 6.5–8.1)

## 2021-05-26 LAB — CBC WITH DIFFERENTIAL (CANCER CENTER ONLY)
Abs Immature Granulocytes: 0.04 10*3/uL (ref 0.00–0.07)
Basophils Absolute: 0 10*3/uL (ref 0.0–0.1)
Basophils Relative: 1 %
Eosinophils Absolute: 0.1 10*3/uL (ref 0.0–0.5)
Eosinophils Relative: 2 %
HCT: 29.4 % — ABNORMAL LOW (ref 36.0–46.0)
Hemoglobin: 10 g/dL — ABNORMAL LOW (ref 12.0–15.0)
Immature Granulocytes: 1 %
Lymphocytes Relative: 8 %
Lymphs Abs: 0.3 10*3/uL — ABNORMAL LOW (ref 0.7–4.0)
MCH: 33.8 pg (ref 26.0–34.0)
MCHC: 34 g/dL (ref 30.0–36.0)
MCV: 99.3 fL (ref 80.0–100.0)
Monocytes Absolute: 0.4 10*3/uL (ref 0.1–1.0)
Monocytes Relative: 10 %
Neutro Abs: 3 10*3/uL (ref 1.7–7.7)
Neutrophils Relative %: 78 %
Platelet Count: 256 10*3/uL (ref 150–400)
RBC: 2.96 MIL/uL — ABNORMAL LOW (ref 3.87–5.11)
RDW: 17.2 % — ABNORMAL HIGH (ref 11.5–15.5)
WBC Count: 3.9 10*3/uL — ABNORMAL LOW (ref 4.0–10.5)
nRBC: 0 % (ref 0.0–0.2)

## 2021-05-26 LAB — LACTATE DEHYDROGENASE: LDH: 218 U/L — ABNORMAL HIGH (ref 98–192)

## 2021-05-26 MED ORDER — DARATUMUMAB-HYALURONIDASE-FIHJ 1800-30000 MG-UT/15ML ~~LOC~~ SOLN
1800.0000 mg | Freq: Once | SUBCUTANEOUS | Status: AC
  Administered 2021-05-26: 1800 mg via SUBCUTANEOUS
  Filled 2021-05-26: qty 15

## 2021-05-26 MED ORDER — ACETAMINOPHEN 325 MG PO TABS
650.0000 mg | ORAL_TABLET | Freq: Once | ORAL | Status: AC
  Administered 2021-05-26: 650 mg via ORAL

## 2021-05-26 MED ORDER — SODIUM CHLORIDE 0.9 % IV SOLN
Freq: Once | INTRAVENOUS | Status: AC
  Filled 2021-05-26: qty 250

## 2021-05-26 MED ORDER — DEXAMETHASONE 4 MG PO TABS
ORAL_TABLET | ORAL | Status: AC
  Filled 2021-05-26: qty 3

## 2021-05-26 MED ORDER — SODIUM CHLORIDE 0.9 % IV SOLN
300.0000 mg/m2 | Freq: Once | INTRAVENOUS | Status: AC
  Administered 2021-05-26: 480 mg via INTRAVENOUS
  Filled 2021-05-26: qty 24

## 2021-05-26 MED ORDER — ACETAMINOPHEN 325 MG PO TABS
ORAL_TABLET | ORAL | Status: AC
  Filled 2021-05-26: qty 2

## 2021-05-26 MED ORDER — DEXAMETHASONE 4 MG PO TABS
10.0000 mg | ORAL_TABLET | Freq: Once | ORAL | Status: AC
  Administered 2021-05-26: 10 mg via ORAL

## 2021-05-26 NOTE — Patient Instructions (Signed)
[facility]  Discharge Instructions: Thank you for choosing [facility] to provide your oncology and hematology care.   If you have a lab appointment with the [facility], please go directly to the [facility] and check in at the registration area.   Wear comfortable clothing and clothing appropriate for easy access to any Portacath or PICC line.   We strive to give you quality time with your provider. You may need to reschedule your appointment if you arrive late (15 or more minutes).  Arriving late affects you and other patients whose appointments are after yours.  Also, if you miss three or more appointments without notifying the office, you may be dismissed from the clinic at the provider's discretion.      For prescription refill requests, have your pharmacy contact our office and allow 72 hours for refills to be completed.    Today you received the following chemotherapy and/or immunotherapy agents Cytoxan Darazalex Faspro    To help prevent nausea and vomiting after your treatment, we encourage you to take your nausea medication as directed.  BELOW ARE SYMPTOMS THAT SHOULD BE REPORTED IMMEDIATELY: . *FEVER GREATER THAN 100.4 F (38 C) OR HIGHER . *CHILLS OR SWEATING . *NAUSEA AND VOMITING THAT IS NOT CONTROLLED WITH YOUR NAUSEA MEDICATION . *UNUSUAL SHORTNESS OF BREATH . *UNUSUAL BRUISING OR BLEEDING . *URINARY PROBLEMS (pain or burning when urinating, or frequent urination) . *BOWEL PROBLEMS (unusual diarrhea, constipation, pain near the anus) . TENDERNESS IN MOUTH AND THROAT WITH OR WITHOUT PRESENCE OF ULCERS (sore throat, sores in mouth, or a toothache) . UNUSUAL RASH, SWELLING OR PAIN  . UNUSUAL VAGINAL DISCHARGE OR ITCHING   Items with * indicate a potential emergency and should be followed up as soon as possible or go to the Emergency Department if any problems should occur.  Please show the CHEMOTHERAPY ALERT CARD or  IMMUNOTHERAPY ALERT CARD at check-in to the Emergency Department and triage nurse.  Should you have questions after your visit or need to cancel or reschedule your appointment, please contact [employer]  Dept: [phone]  and follow the prompts.  Office hours are 8:00 a.m. to 4:30 p.m. Monday - Friday. Please note that voicemails left after 4:00 p.m. may not be returned until the following business day.  We are closed weekends and major holidays. You have access to a nurse at all times for urgent questions. Please call the main number to the clinic Dept: [phone] and follow the prompts.   For any non-urgent questions, you may also contact your provider using MyChart. We now offer e-Visits for anyone 8 and older to request care online for non-urgent symptoms. For details visit [email or web address].   Also download the MyChart app! Go to the app store, search "MyChart", open the app, select [facility], and log in with your MyChart username and password.  Due to Covid, a mask is required upon entering the hospital/clinic. If you do not have a mask, one will be given to you upon arrival. For doctor visits, patients may have 1 support person aged 63 or older with them. For treatment visits, patients cannot have anyone with them due to current Covid guidelines and our immunocompromised population.

## 2021-05-29 LAB — KAPPA/LAMBDA LIGHT CHAINS
Kappa free light chain: 8.4 mg/L (ref 3.3–19.4)
Kappa, lambda light chain ratio: 0.13 — ABNORMAL LOW (ref 0.26–1.65)
Lambda free light chains: 63 mg/L — ABNORMAL HIGH (ref 5.7–26.3)

## 2021-05-30 LAB — MULTIPLE MYELOMA PANEL, SERUM
Albumin SerPl Elph-Mcnc: 2.8 g/dL — ABNORMAL LOW (ref 2.9–4.4)
Albumin/Glob SerPl: 1.5 (ref 0.7–1.7)
Alpha 1: 0.3 g/dL (ref 0.0–0.4)
Alpha2 Glob SerPl Elph-Mcnc: 0.8 g/dL (ref 0.4–1.0)
B-Globulin SerPl Elph-Mcnc: 0.8 g/dL (ref 0.7–1.3)
Gamma Glob SerPl Elph-Mcnc: 0.2 g/dL — ABNORMAL LOW (ref 0.4–1.8)
Globulin, Total: 2 g/dL — ABNORMAL LOW (ref 2.2–3.9)
IgA: 52 mg/dL — ABNORMAL LOW (ref 64–422)
IgG (Immunoglobin G), Serum: 275 mg/dL — ABNORMAL LOW (ref 586–1602)
IgM (Immunoglobulin M), Srm: 28 mg/dL (ref 26–217)
M Protein SerPl Elph-Mcnc: 0.1 g/dL — ABNORMAL HIGH
Total Protein ELP: 4.8 g/dL — ABNORMAL LOW (ref 6.0–8.5)

## 2021-05-31 ENCOUNTER — Ambulatory Visit (INDEPENDENT_AMBULATORY_CARE_PROVIDER_SITE_OTHER): Payer: Medicare Other | Admitting: Family Medicine

## 2021-05-31 ENCOUNTER — Encounter: Payer: Self-pay | Admitting: Family Medicine

## 2021-05-31 ENCOUNTER — Other Ambulatory Visit: Payer: Self-pay

## 2021-05-31 VITALS — BP 128/72 | HR 82 | Temp 97.4°F | Ht 62.0 in | Wt 116.5 lb

## 2021-05-31 DIAGNOSIS — C9000 Multiple myeloma not having achieved remission: Secondary | ICD-10-CM

## 2021-05-31 DIAGNOSIS — F418 Other specified anxiety disorders: Secondary | ICD-10-CM

## 2021-05-31 DIAGNOSIS — E871 Hypo-osmolality and hyponatremia: Secondary | ICD-10-CM | POA: Diagnosis not present

## 2021-05-31 DIAGNOSIS — I824Z3 Acute embolism and thrombosis of unspecified deep veins of distal lower extremity, bilateral: Secondary | ICD-10-CM | POA: Diagnosis not present

## 2021-05-31 DIAGNOSIS — Z789 Other specified health status: Secondary | ICD-10-CM | POA: Diagnosis not present

## 2021-05-31 DIAGNOSIS — I10 Essential (primary) hypertension: Secondary | ICD-10-CM

## 2021-05-31 DIAGNOSIS — J810 Acute pulmonary edema: Secondary | ICD-10-CM | POA: Diagnosis not present

## 2021-05-31 NOTE — Patient Instructions (Addendum)
No change in medications  Continue the bumex and the support hose as needed   Don't drive on alprazolam and use caution due to fall risk with that   Get your 2nd covid vaccine booster at a pharmacy  [facility]   COVID-19 Vaccine Information can be found at: ShippingScam.co.uk For questions related to vaccine distribution or appointments, please email [email or web address] or call [phone].

## 2021-05-31 NOTE — Assessment & Plan Note (Signed)
Stable with current bumex and fluid restriction
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
## 2021-05-31 NOTE — Assessment & Plan Note (Signed)
Stable during recent hosp and myeloma treatment  Plan to continue metoprolol 25 mg bid  BP: 128/72

## 2021-05-31 NOTE — Assessment & Plan Note (Signed)
Pt has added alprazolam prn at night with caution of sedation and falls  Steroids given with her cancer treatment does worsen anxiety and insomnia

## 2021-05-31 NOTE — Assessment & Plan Note (Signed)
Doing well with her anticoag/eliquis  Had scant heme pos stool with last hospitalization/ though to be due to wiping  No c/o today and cbc is stable

## 2021-05-31 NOTE — Assessment & Plan Note (Addendum)
Pt desires to be full code at this time   Discussed this in detail  Her advance directive is up to date and her poa would be able to make decisions for her in the case that her life was prolonged with poor quality

## 2021-05-31 NOTE — Assessment & Plan Note (Signed)
This has occurred twice in the setting of cancer treatment with chemo  Rev recent hosp for it  Reviewed hospital records, lab results and studies in detail   Pt has made a good recovery with return to baseline labs on 6/3 Reassuring exam with pulse ox 97% Continues bumex as ordered  Continue oncology f/u  Aware of s/s to watch for

## 2021-05-31 NOTE — Assessment & Plan Note (Signed)
Pt continues oncology f/u in setting of hosp for flash pulmonary edema (from chemo) Doing better  Watching fluid status closely especially after her treatments

## 2021-06-02 ENCOUNTER — Other Ambulatory Visit: Payer: Self-pay

## 2021-06-02 ENCOUNTER — Inpatient Hospital Stay: Payer: Medicare Other

## 2021-06-02 ENCOUNTER — Inpatient Hospital Stay (HOSPITAL_BASED_OUTPATIENT_CLINIC_OR_DEPARTMENT_OTHER): Payer: Medicare Other | Admitting: Hematology and Oncology

## 2021-06-02 ENCOUNTER — Encounter: Payer: Self-pay | Admitting: Hematology and Oncology

## 2021-06-02 VITALS — BP 120/65 | HR 71 | Temp 97.6°F | Resp 17 | Wt 117.3 lb

## 2021-06-02 DIAGNOSIS — R803 Bence Jones proteinuria: Secondary | ICD-10-CM

## 2021-06-02 DIAGNOSIS — E8581 Light chain (AL) amyloidosis: Secondary | ICD-10-CM

## 2021-06-02 DIAGNOSIS — Z5112 Encounter for antineoplastic immunotherapy: Secondary | ICD-10-CM | POA: Diagnosis not present

## 2021-06-02 LAB — CBC WITH DIFFERENTIAL (CANCER CENTER ONLY)
Abs Immature Granulocytes: 0.03 10*3/uL (ref 0.00–0.07)
Basophils Absolute: 0.1 10*3/uL (ref 0.0–0.1)
Basophils Relative: 1 %
Eosinophils Absolute: 0.1 10*3/uL (ref 0.0–0.5)
Eosinophils Relative: 2 %
HCT: 29.4 % — ABNORMAL LOW (ref 36.0–46.0)
Hemoglobin: 10.2 g/dL — ABNORMAL LOW (ref 12.0–15.0)
Immature Granulocytes: 1 %
Lymphocytes Relative: 6 %
Lymphs Abs: 0.3 10*3/uL — ABNORMAL LOW (ref 0.7–4.0)
MCH: 34.3 pg — ABNORMAL HIGH (ref 26.0–34.0)
MCHC: 34.7 g/dL (ref 30.0–36.0)
MCV: 99 fL (ref 80.0–100.0)
Monocytes Absolute: 0.6 10*3/uL (ref 0.1–1.0)
Monocytes Relative: 13 %
Neutro Abs: 3.6 10*3/uL (ref 1.7–7.7)
Neutrophils Relative %: 77 %
Platelet Count: 283 10*3/uL (ref 150–400)
RBC: 2.97 MIL/uL — ABNORMAL LOW (ref 3.87–5.11)
RDW: 16.9 % — ABNORMAL HIGH (ref 11.5–15.5)
WBC Count: 4.7 10*3/uL (ref 4.0–10.5)
nRBC: 0 % (ref 0.0–0.2)

## 2021-06-02 LAB — CMP (CANCER CENTER ONLY)
ALT: 11 U/L (ref 0–44)
AST: 15 U/L (ref 15–41)
Albumin: 2.7 g/dL — ABNORMAL LOW (ref 3.5–5.0)
Alkaline Phosphatase: 71 U/L (ref 38–126)
Anion gap: 8 (ref 5–15)
BUN: 19 mg/dL (ref 8–23)
CO2: 26 mmol/L (ref 22–32)
Calcium: 8.8 mg/dL — ABNORMAL LOW (ref 8.9–10.3)
Chloride: 98 mmol/L (ref 98–111)
Creatinine: 0.81 mg/dL (ref 0.44–1.00)
GFR, Estimated: >60 mL/min (ref >60)
Glucose, Bld: 81 mg/dL (ref 70–99)
Potassium: 4.2 mmol/L (ref 3.5–5.1)
Sodium: 132 mmol/L — ABNORMAL LOW (ref 135–145)
Total Bilirubin: 0.5 mg/dL (ref 0.3–1.2)
Total Protein: 5.4 g/dL — ABNORMAL LOW (ref 6.5–8.1)

## 2021-06-02 LAB — LACTATE DEHYDROGENASE: LDH: 216 U/L — ABNORMAL HIGH (ref 98–192)

## 2021-06-02 MED ORDER — SODIUM CHLORIDE 0.9 % IV SOLN
300.0000 mg/m2 | Freq: Once | INTRAVENOUS | Status: AC
  Administered 2021-06-02: 480 mg via INTRAVENOUS
  Filled 2021-06-02: qty 24

## 2021-06-02 MED ORDER — SODIUM CHLORIDE 0.9 % IV SOLN
Freq: Once | INTRAVENOUS | Status: AC
  Filled 2021-06-02: qty 250

## 2021-06-02 MED ORDER — DEXAMETHASONE 4 MG PO TABS
10.0000 mg | ORAL_TABLET | Freq: Once | ORAL | Status: AC
  Administered 2021-06-02: 10 mg via ORAL

## 2021-06-02 MED ORDER — DEXAMETHASONE 4 MG PO TABS
ORAL_TABLET | ORAL | Status: AC
  Filled 2021-06-02: qty 3
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
## 2021-06-02 NOTE — Patient Instructions (Signed)
[facility]COL[facility]  Discharge Instructions: Thank you for choosing [facility] to provide your oncology and hematology care.   If you have a lab appointment with the [facility], please go directly to the [facility] and check in at the registration area.   Wear comfortable clothing and clothing appropriate for easy access to any Portacath or PICC line.   We strive to give you quality time with your provider. You may need to reschedule your appointment if you arrive late (15 or more minutes).  Arriving late affects you and other patients whose appointments are after yours.  Also, if you miss three or more appointments without notifying the office, you may be dismissed from the clinic at the provider's discretion.      For prescription refill requests, have your pharmacy contact our office and allow 72 hours for refills to be completed.    Today you received the following chemotherapy and/or immunotherapy agents: Cytoxan.     To help prevent nausea and vomiting after your treatment, we encourage you to take your nausea medication as directed.  BELOW ARE SYMPTOMS THAT SHOULD BE REPORTED IMMEDIATELY: *FEVER GREATER THAN 100.4 F (38 C) OR HIGHER *CHILLS OR SWEATING *NAUSEA AND VOMITING THAT IS NOT CONTROLLED WITH YOUR NAUSEA MEDICATION *UNUSUAL SHORTNESS OF BREATH *UNUSUAL BRUISING OR BLEEDING *URINARY PROBLEMS (pain or burning when urinating, or frequent urination) *BOWEL PROBLEMS (unusual diarrhea, constipation, pain near the anus) TENDERNESS IN MOUTH AND THROAT WITH OR WITHOUT PRESENCE OF ULCERS (sore throat, sores in mouth, or a toothache) UNUSUAL RASH, SWELLING OR PAIN  UNUSUAL VAGINAL DISCHARGE OR ITCHING   Items with * indicate a potential emergency and should be followed up as soon as possible or go to the Emergency Department if any problems should occur.  Please show the CHEMOTHERAPY ALERT CARD or IMMUNOTHERAPY ALERT CARD at check-in to  the Emergency Department and triage nurse.  Should you have questions after your visit or need to cancel or reschedule your appointment, please contact [facility]  Dept: [phone]  and follow the prompts.  Office hours are 8:00 a.m. to 4:30 p.m. Monday - Friday. Please note that voicemails left after 4:00 p.m. may not be returned until the following business day.  We are closed weekends and major holidays. You have access to a nurse at all times for urgent questions. Please call the main number to the clinic Dept: [phone] and follow the prompts.   For any non-urgent questions, you may also contact your provider using MyChart. We now offer e-Visits for anyone 34 and older to request care online for non-urgent symptoms. For details visit [email or web address].   Also download the MyChart app! Go to the app store, search "MyChart", open the app, select [facility], and log in with your MyChart username and password.  Due to Covid, a mask is required upon entering the hospital/clinic. If you do not have a mask, one will be given to you upon arrival. For doctor visits, patients may have 1 support person aged 74 or older with them. For treatment visits, patients cannot have anyone with them due to current Covid guidelines and our immunocompromised population.

## 2021-06-04 ENCOUNTER — Other Ambulatory Visit: Payer: Self-pay | Admitting: Cardiovascular Disease

## 2021-06-06 ENCOUNTER — Encounter: Payer: Self-pay | Admitting: Hematology and Oncology

## 2021-06-08 ENCOUNTER — Other Ambulatory Visit: Payer: Self-pay

## 2021-06-08 ENCOUNTER — Other Ambulatory Visit: Payer: Medicare Other

## 2021-06-08 ENCOUNTER — Inpatient Hospital Stay: Payer: Medicare Other

## 2021-06-08 ENCOUNTER — Ambulatory Visit: Payer: Medicare Other

## 2021-06-08 VITALS — BP 116/68 | HR 74 | Temp 97.8°F | Resp 18

## 2021-06-08 DIAGNOSIS — E8581 Light chain (AL) amyloidosis: Secondary | ICD-10-CM

## 2021-06-08 DIAGNOSIS — Z5112 Encounter for antineoplastic immunotherapy: Secondary | ICD-10-CM | POA: Diagnosis not present

## 2021-06-08 LAB — CMP (CANCER CENTER ONLY)
ALT: 15 U/L (ref 0–44)
AST: 20 U/L (ref 15–41)
Albumin: 3.1 g/dL — ABNORMAL LOW (ref 3.5–5.0)
Alkaline Phosphatase: 64 U/L (ref 38–126)
Anion gap: 12 (ref 5–15)
BUN: 18 mg/dL (ref 8–23)
CO2: 22 mmol/L (ref 22–32)
Calcium: 8.7 mg/dL — ABNORMAL LOW (ref 8.9–10.3)
Chloride: 99 mmol/L (ref 98–111)
Creatinine: 0.89 mg/dL (ref 0.44–1.00)
GFR, Estimated: >60 mL/min (ref >60)
Glucose, Bld: 104 mg/dL — ABNORMAL HIGH (ref 70–99)
Potassium: 4.1 mmol/L (ref 3.5–5.1)
Sodium: 133 mmol/L — ABNORMAL LOW (ref 135–145)
Total Bilirubin: 0.5 mg/dL (ref 0.3–1.2)
Total Protein: 5.4 g/dL — ABNORMAL LOW (ref 6.5–8.1)

## 2021-06-08 LAB — CBC WITH DIFFERENTIAL (CANCER CENTER ONLY)
Abs Immature Granulocytes: 0.02 10*3/uL (ref 0.00–0.07)
Basophils Absolute: 0.1 10*3/uL (ref 0.0–0.1)
Basophils Relative: 1 %
Eosinophils Absolute: 0.1 10*3/uL (ref 0.0–0.5)
Eosinophils Relative: 1 %
HCT: 30 % — ABNORMAL LOW (ref 36.0–46.0)
Hemoglobin: 10.4 g/dL — ABNORMAL LOW (ref 12.0–15.0)
Immature Granulocytes: 0 %
Lymphocytes Relative: 6 %
Lymphs Abs: 0.4 10*3/uL — ABNORMAL LOW (ref 0.7–4.0)
MCH: 34.8 pg — ABNORMAL HIGH (ref 26.0–34.0)
MCHC: 34.7 g/dL (ref 30.0–36.0)
MCV: 100.3 fL — ABNORMAL HIGH (ref 80.0–100.0)
Monocytes Absolute: 0.4 10*3/uL (ref 0.1–1.0)
Monocytes Relative: 7 %
Neutro Abs: 4.8 10*3/uL (ref 1.7–7.7)
Neutrophils Relative %: 85 %
Platelet Count: 302 10*3/uL (ref 150–400)
RBC: 2.99 MIL/uL — ABNORMAL LOW (ref 3.87–5.11)
RDW: 16.8 % — ABNORMAL HIGH (ref 11.5–15.5)
WBC Count: 5.8 10*3/uL (ref 4.0–10.5)
nRBC: 0 % (ref 0.0–0.2)

## 2021-06-08 LAB — LACTATE DEHYDROGENASE: LDH: 220 U/L — ABNORMAL HIGH (ref 98–192)

## 2021-06-08 MED ORDER — SODIUM CHLORIDE 0.9 % IV SOLN
300.0000 mg/m2 | Freq: Once | INTRAVENOUS | Status: AC
  Administered 2021-06-08: 480 mg via INTRAVENOUS
  Filled 2021-06-08: qty 24

## 2021-06-08 MED ORDER — DEXAMETHASONE 4 MG PO TABS
ORAL_TABLET | ORAL | Status: AC
  Filled 2021-06-08: qty 3

## 2021-06-08 MED ORDER — ACETAMINOPHEN 325 MG PO TABS
650.0000 mg | ORAL_TABLET | Freq: Once | ORAL | Status: AC
  Administered 2021-06-08: 650 mg via ORAL

## 2021-06-08 MED ORDER — SODIUM CHLORIDE 0.9 % IV SOLN
Freq: Once | INTRAVENOUS | Status: AC
  Filled 2021-06-08: qty 250

## 2021-06-08 MED ORDER — DEXAMETHASONE 4 MG PO TABS
10.0000 mg | ORAL_TABLET | Freq: Once | ORAL | Status: AC
Start: 2021-06-08 — End: 2021-06-08
  Administered 2021-06-08: 10 mg via ORAL

## 2021-06-08 MED ORDER — ACETAMINOPHEN 325 MG PO TABS
ORAL_TABLET | ORAL | Status: AC
  Filled 2021-06-08: qty 2

## 2021-06-08 MED ORDER — DARATUMUMAB-HYALURONIDASE-FIHJ 1800-30000 MG-UT/15ML ~~LOC~~ SOLN
1800.0000 mg | Freq: Once | SUBCUTANEOUS | Status: AC
  Administered 2021-06-08: 1800 mg via SUBCUTANEOUS
  Filled 2021-06-08: qty 15

## 2021-06-08 NOTE — Patient Instructions (Signed)
[facility]COL[facility]  Discharge Instructions: Thank you for choosing [facility] to provide your oncology and hematology care.   If you have a lab appointment with the [facility], please go directly to the [facility] and check in at the registration area.   Wear comfortable clothing and clothing appropriate for easy access to any Portacath or PICC line.   We strive to give you quality time with your provider. You may need to reschedule your appointment if you arrive late (15 or more minutes).  Arriving late affects you and other patients whose appointments are after yours.  Also, if you miss three or more appointments without notifying the office, you may be dismissed from the clinic at the provider's discretion.      For prescription refill requests, have your pharmacy contact our office and allow 72 hours for refills to be completed.    Today you received the following chemotherapy and/or immunotherapy agents cytoxan darzalex faspro      To help prevent nausea and vomiting after your treatment, we encourage you to take your nausea medication as directed.  BELOW ARE SYMPTOMS THAT SHOULD BE REPORTED IMMEDIATELY: *FEVER GREATER THAN 100.4 F (38 C) OR HIGHER *CHILLS OR SWEATING *NAUSEA AND VOMITING THAT IS NOT CONTROLLED WITH YOUR NAUSEA MEDICATION *UNUSUAL SHORTNESS OF BREATH *UNUSUAL BRUISING OR BLEEDING *URINARY PROBLEMS (pain or burning when urinating, or frequent urination) *BOWEL PROBLEMS (unusual diarrhea, constipation, pain near the anus) TENDERNESS IN MOUTH AND THROAT WITH OR WITHOUT PRESENCE OF ULCERS (sore throat, sores in mouth, or a toothache) UNUSUAL RASH, SWELLING OR PAIN  UNUSUAL VAGINAL DISCHARGE OR ITCHING   Items with * indicate a potential emergency and should be followed up as soon as possible or go to the Emergency Department if any problems should occur.  Please show the CHEMOTHERAPY ALERT CARD or IMMUNOTHERAPY ALERT CARD at  check-in to the Emergency Department and triage nurse.  Should you have questions after your visit or need to cancel or reschedule your appointment, please contact [facility]  Dept: [phone]  and follow the prompts.  Office hours are 8:00 a.m. to 4:30 p.m. Monday - Friday. Please note that voicemails left after 4:00 p.m. may not be returned until the following business day.  We are closed weekends and major holidays. You have access to a nurse at all times for urgent questions. Please call the main number to the clinic Dept: [phone] and follow the prompts.   For any non-urgent questions, you may also contact your provider using MyChart. We now offer e-Visits for anyone 52 and older to request care online for non-urgent symptoms. For details visit [email or web address].   Also download the MyChart app! Go to the app store, search "MyChart", open the app, select [facility], and log in with your MyChart username and password.  Due to Covid, a mask is required upon entering the hospital/clinic. If you do not have a mask, one will be given to you upon arrival. For doctor visits, patients may have 1 support person aged 9 or older with them. For treatment visits, patients cannot have anyone with them due to current Covid guidelines and our immunocompromised population.

## 2021-06-12 ENCOUNTER — Ambulatory Visit
Admission: RE | Admit: 2021-06-12 | Discharge: 2021-06-12 | Disposition: A | Discharge status: Home / Self Care | Payer: Medicare Other | Source: Ambulatory Visit | Attending: Pulmonary Disease | Admitting: Pulmonary Disease

## 2021-06-12 DIAGNOSIS — R918 Other nonspecific abnormal finding of lung field: Secondary | ICD-10-CM

## 2021-06-12 LAB — UPEP/UIFE/LIGHT CHAINS/TP, 24-HR UR
% BETA, Urine: 9.1 %
ALPHA 1 URINE: 1.8 %
Albumin, U: 83.2 %
Alpha 2, Urine: 5.1 %
Free Kappa Lt Chains,Ur: 3.09 mg/L (ref 1.17–86.46)
Free Kappa/Lambda Ratio: 0.52 — ABNORMAL LOW (ref 1.83–14.26)
Free Lambda Lt Chains,Ur: 5.99 mg/L (ref 0.27–15.21)
GAMMA GLOBULIN URINE: 0.8 %
Total Protein, Urine-Ur/day: 717 mg/24 hr — ABNORMAL HIGH (ref 30–150)
Total Protein, Urine: 81.9 mg/dL
Total Volume: 875

## 2021-06-12 IMAGING — CT CT CHEST W/O CM
2 of 4 series · 15 of 36 positions shown, 18 images · non-contrast
Comparison: [DATE] and [DATE]

CLINICAL DATA: History of multiple myeloma sent for lung nodule
follow-up.

EXAM:
CT CHEST WITHOUT CONTRAST
TECHNIQUE: Multidetector CT imaging of the chest was performed following the
standard protocol without IV contrast.

[Series 2: chest 2.00 br40 s3 · axial · 0.63mm/px · z∈[+1580,+1828]mm · 12 of 148 slices shown, 15 images (1 of 2)]
[im 12/148  mediastinal]
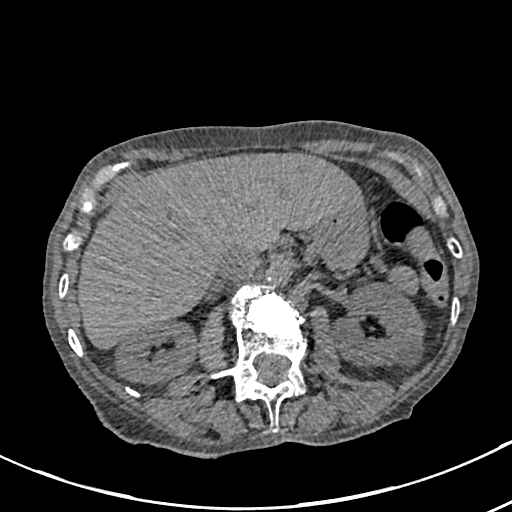
[im 12/148  lung]
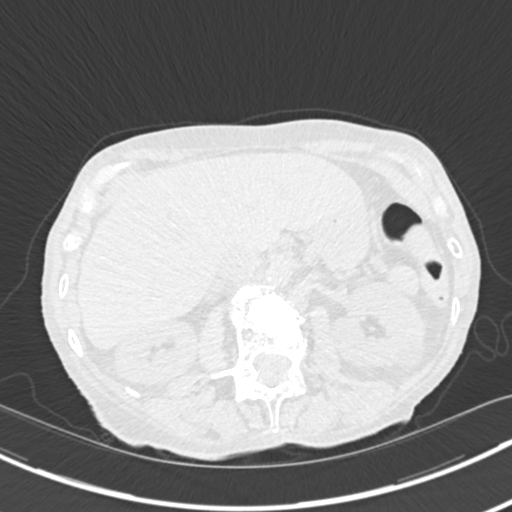
[im 23/148  lung]
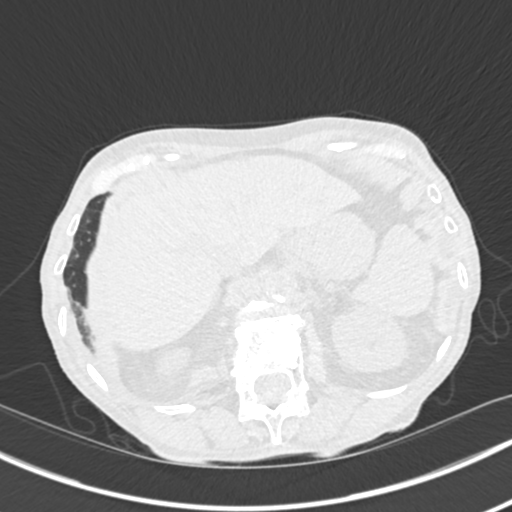
[im 34/148  lung]
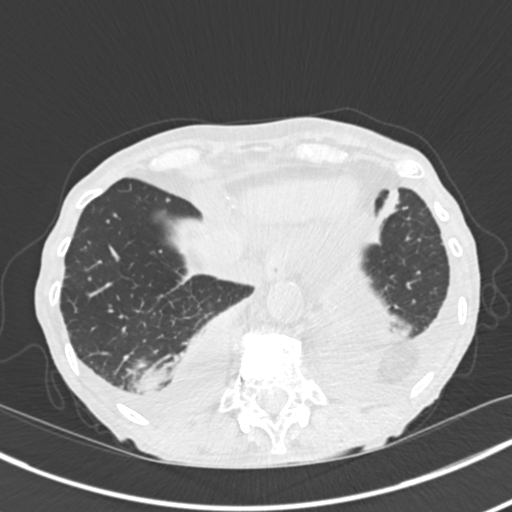
[im 46/148  lung]
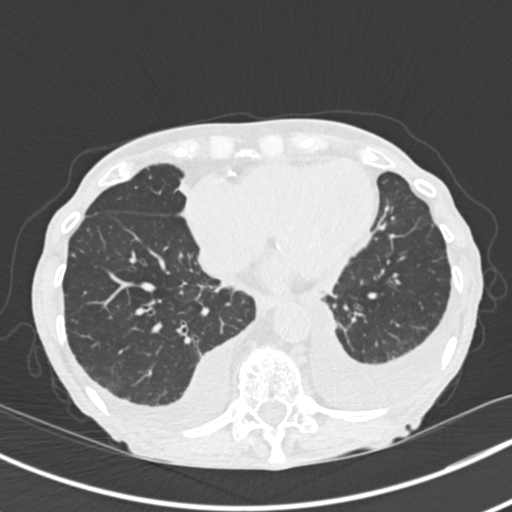
[im 57/148  mediastinal]
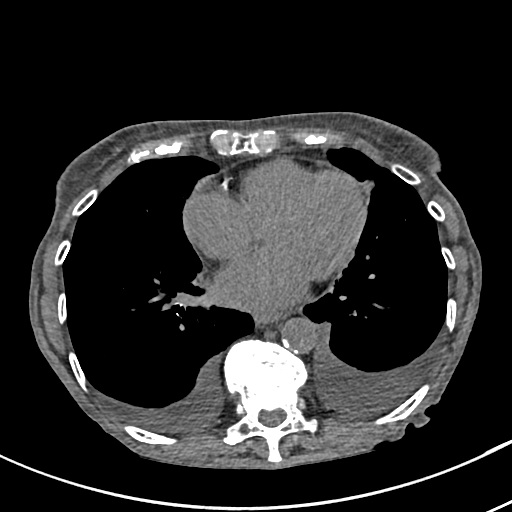
[im 57/148  lung]
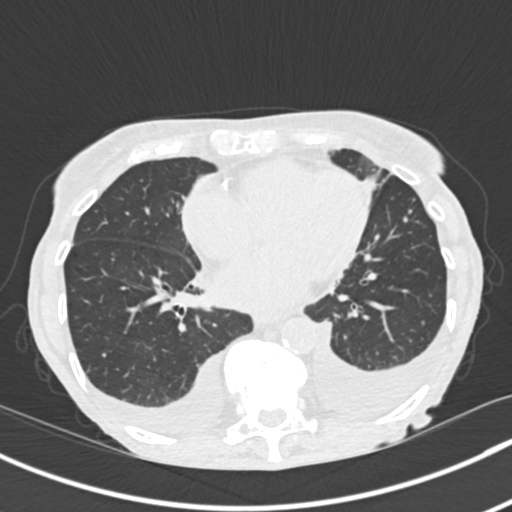
[im 68/148  lung]
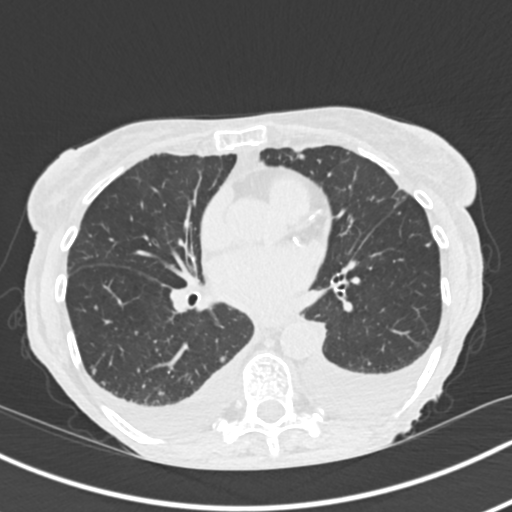
[im 80/148  lung]
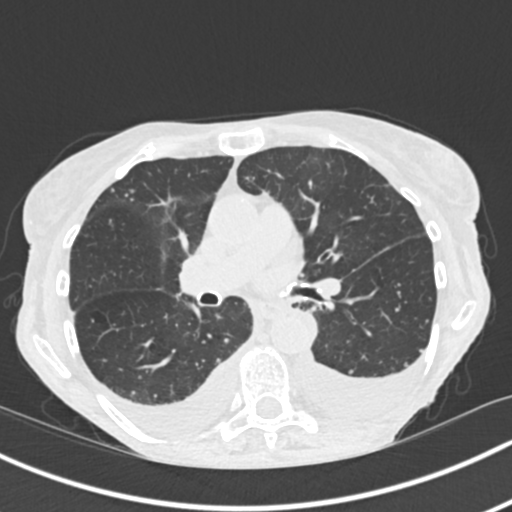
[im 91/148  lung]
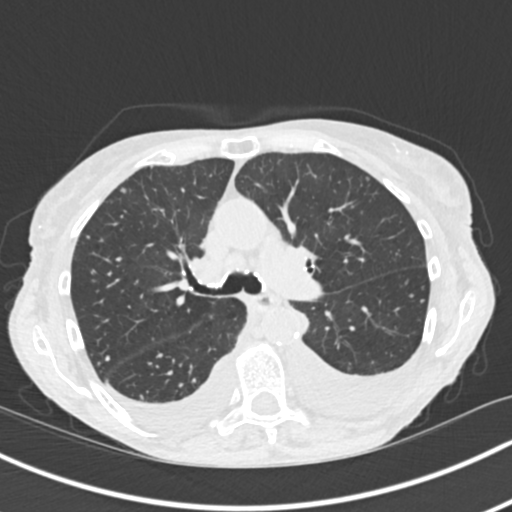
[im 102/148  mediastinal]
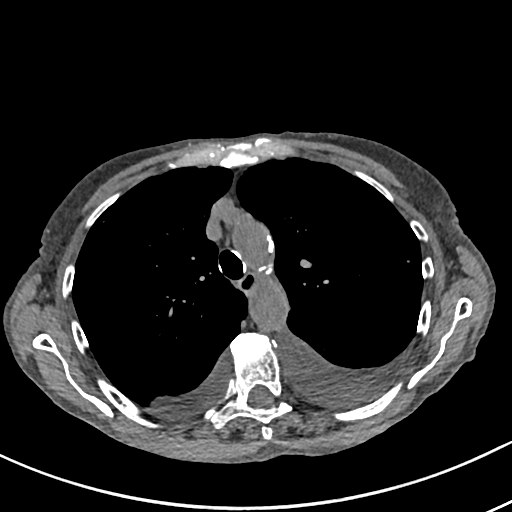
[im 102/148  lung]
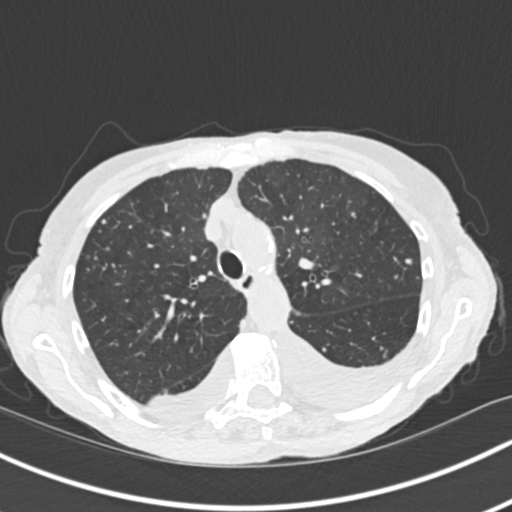
[im 114/148  lung]
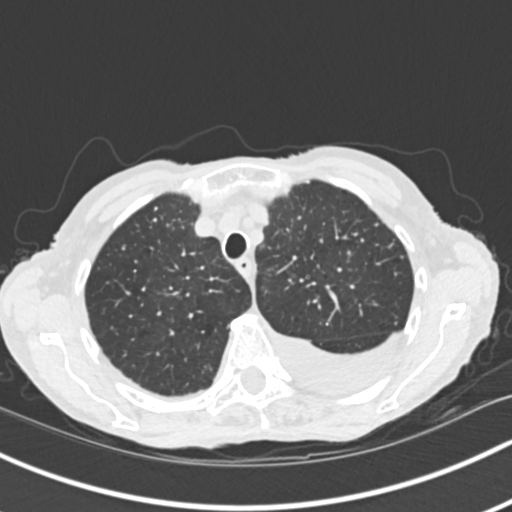
[im 125/148  lung]
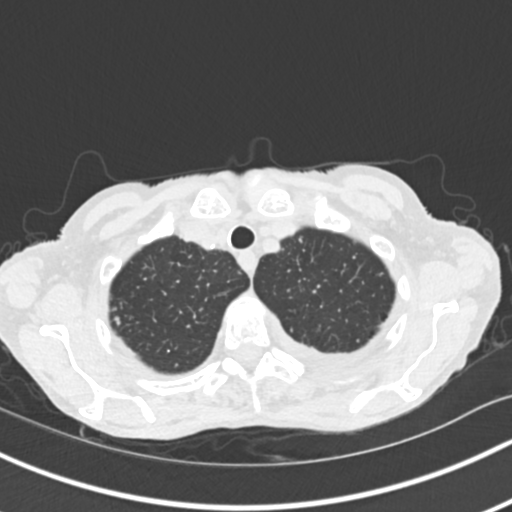
[im 136/148  lung]
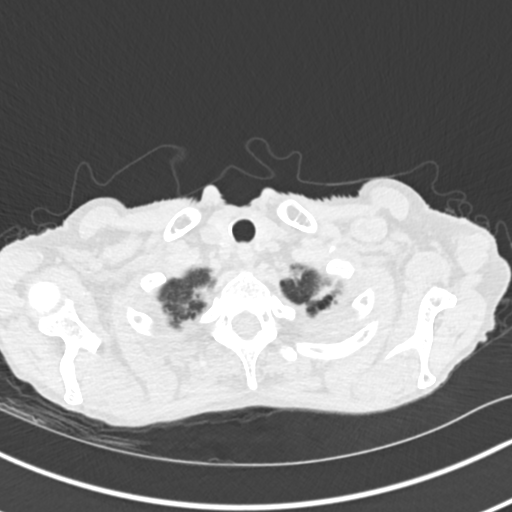

[Series 4: chest 2.00 br40 s3 · coronal · 0.58mm/px · 3 of 157 slices shown (2 of 2)]
[im 32/157  lung]
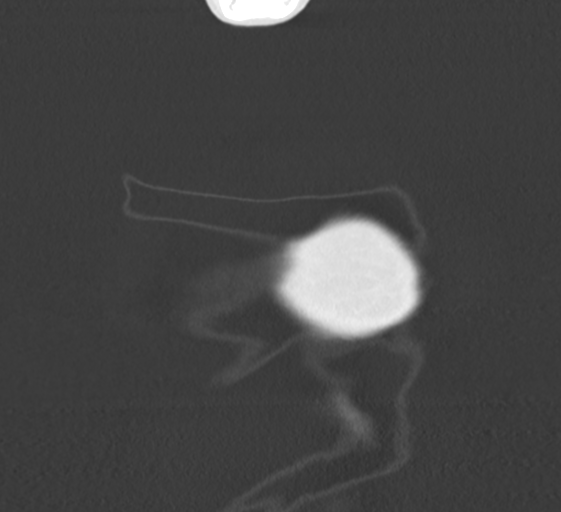
[im 63/157  lung]
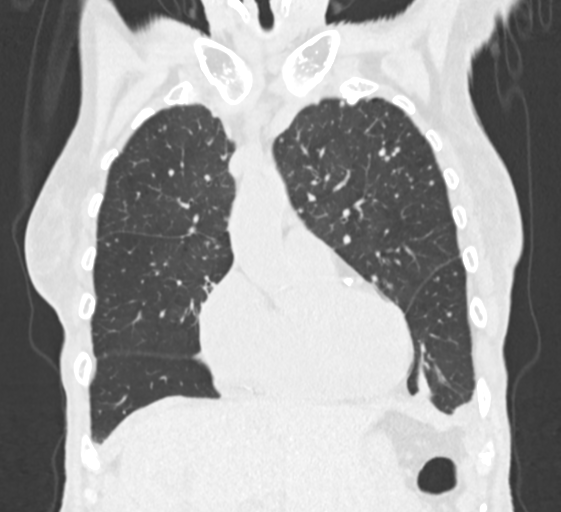
[im 94/157  lung]
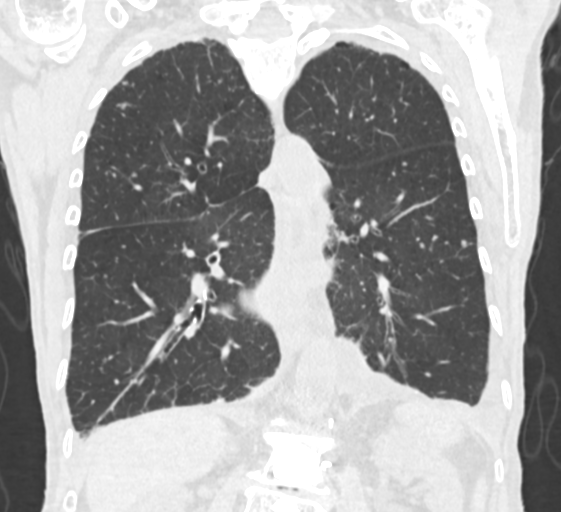

[15 of 36 positions shown; findings below may reference images not displayed]

FINDINGS: Cardiovascular: There is moderate severity calcification of the
thoracic aorta without evidence of aneurysmal dilatation. Normal
heart size with marked severity coronary artery calcification peer.
No pericardial effusion.

Mediastinum/Nodes: No enlarged mediastinal or axillary lymph nodes.
Thyroid gland, trachea, and esophagus demonstrate no significant
findings.

Lungs/Pleura: Innumerable, stable bilateral subcentimeter
noncalcified lung nodules are noted. The largest nodule measures
approximately 4 mm.

Mild scarring and/or atelectasis is seen within the bilateral apices
and bilateral lung bases. The asymmetric, predominantly
ground-glass, mixed density area seen within the right lung on the
prior study is no longer visualized.

Small, stable bilateral pleural effusions are seen.

No pneumothorax is identified.

Upper Abdomen: There is a small hiatal hernia.

Musculoskeletal: A chronic compression fracture deformity is seen at
the level of T11. Prior vertebroplasty is also seen at the level of
T6 L1, L2, L3 and L4.
IMPRESSION: 1. Interval resolution of the mixed density, predominantly
ground-glass area seen within the right lung on the prior study.
2. Innumerable bilateral subcentimeter noncalcified lung nodules,
stable in appearance as far back as [DATE]. No follow-up
needed if patient is low-risk (and has no known or suspected primary
neoplasm). Non-contrast chest CT can be considered in 12 months if
patient is high-risk. This recommendation follows the consensus
statement: Guidelines for Management of Incidental Pulmonary Nodules
Detected on CT Images: From the [HOSPITAL] [I8]; Radiology
3. Small, stable bilateral pleural effusions.

## 2021-06-13 ENCOUNTER — Telehealth: Payer: Self-pay | Admitting: Pulmonary Disease

## 2021-06-13 DIAGNOSIS — R918 Other nonspecific abnormal finding of lung field: Secondary | ICD-10-CM

## 2021-06-13 NOTE — Telephone Encounter (Signed)
[facility] Telephone Call  I contacted patient regarding CT which demonstrates resolution of ground glass densities. Unchanged noncalcified lung nodules <50mm present. These are suspected to be benign and low risk however after discussion with patient she would opt for 12 month follow-up of nodules which is reasonable with her prior smoking history. Chest imaging also demonstrates small stable bilateral pleural effusions which she manages on her diuretics. I offered to increase dose to help improve symptoms but she feels symptoms are fairly controlled. I advised to discuss with her Nephrologist who is managing the diuretics to consider increasing dose if symptoms persist/worsen.  Assessment Subcentimeter non-calcified pulmonary nodules Ground glass opacities - resolved  Plan Staff - please place patient on recall for follow-up in one year with me Plan to repeat CT chest without contrast in one year  Rodman Pickle, M.D. [facility] Pulmonary/Critical Care Medicine 06/13/2021 8:26 PM   See Amion for personal pager For hours between 7 PM to 7 AM, please call Elink for urgent questions

## 2021-06-14 NOTE — Telephone Encounter (Signed)
12 month recall with Dr Loanne Drilling placed and placed order for ct chest to be repeated in 12 months.

## 2021-06-14 NOTE — Addendum Note (Signed)
Addended by: Rosana Berger on: 06/14/2021 09:00 AM   Modules accepted: Orders

## 2021-06-15 ENCOUNTER — Encounter: Payer: Self-pay | Admitting: Hematology and Oncology

## 2021-06-15 ENCOUNTER — Inpatient Hospital Stay: Payer: Medicare Other

## 2021-06-15 ENCOUNTER — Inpatient Hospital Stay (HOSPITAL_BASED_OUTPATIENT_CLINIC_OR_DEPARTMENT_OTHER): Payer: Medicare Other | Admitting: Hematology and Oncology

## 2021-06-15 ENCOUNTER — Other Ambulatory Visit: Payer: Self-pay

## 2021-06-15 VITALS — BP 111/99 | HR 84 | Temp 98.0°F | Resp 17 | Ht 62.0 in | Wt 118.6 lb

## 2021-06-15 DIAGNOSIS — E8581 Light chain (AL) amyloidosis: Secondary | ICD-10-CM | POA: Diagnosis not present

## 2021-06-15 DIAGNOSIS — R803 Bence Jones proteinuria: Secondary | ICD-10-CM | POA: Diagnosis not present

## 2021-06-15 DIAGNOSIS — Z5112 Encounter for antineoplastic immunotherapy: Secondary | ICD-10-CM | POA: Diagnosis not present

## 2021-06-15 LAB — CBC WITH DIFFERENTIAL (CANCER CENTER ONLY)
Abs Immature Granulocytes: 0.03 10*3/uL (ref 0.00–0.07)
Basophils Absolute: 0 10*3/uL (ref 0.0–0.1)
Basophils Relative: 1 %
Eosinophils Absolute: 0.1 10*3/uL (ref 0.0–0.5)
Eosinophils Relative: 1 %
HCT: 28.8 % — ABNORMAL LOW (ref 36.0–46.0)
Hemoglobin: 10.3 g/dL — ABNORMAL LOW (ref 12.0–15.0)
Immature Granulocytes: 1 %
Lymphocytes Relative: 5 %
Lymphs Abs: 0.3 10*3/uL — ABNORMAL LOW (ref 0.7–4.0)
MCH: 35.4 pg — ABNORMAL HIGH (ref 26.0–34.0)
MCHC: 35.8 g/dL (ref 30.0–36.0)
MCV: 99 fL (ref 80.0–100.0)
Monocytes Absolute: 0.6 10*3/uL (ref 0.1–1.0)
Monocytes Relative: 11 %
Neutro Abs: 4.7 10*3/uL (ref 1.7–7.7)
Neutrophils Relative %: 81 %
Platelet Count: 296 10*3/uL (ref 150–400)
RBC: 2.91 MIL/uL — ABNORMAL LOW (ref 3.87–5.11)
RDW: 16.6 % — ABNORMAL HIGH (ref 11.5–15.5)
WBC Count: 5.7 10*3/uL (ref 4.0–10.5)
nRBC: 0 % (ref 0.0–0.2)

## 2021-06-15 LAB — CMP (CANCER CENTER ONLY)
ALT: 10 U/L (ref 0–44)
AST: 15 U/L (ref 15–41)
Albumin: 2.7 g/dL — ABNORMAL LOW (ref 3.5–5.0)
Alkaline Phosphatase: 66 U/L (ref 38–126)
Anion gap: 10 (ref 5–15)
BUN: 16 mg/dL (ref 8–23)
CO2: 24 mmol/L (ref 22–32)
Calcium: 8.3 mg/dL — ABNORMAL LOW (ref 8.9–10.3)
Chloride: 99 mmol/L (ref 98–111)
Creatinine: 0.75 mg/dL (ref 0.44–1.00)
GFR, Estimated: >60 mL/min (ref >60)
Glucose, Bld: 93 mg/dL (ref 70–99)
Potassium: 3.8 mmol/L (ref 3.5–5.1)
Sodium: 133 mmol/L — ABNORMAL LOW (ref 135–145)
Total Bilirubin: 0.5 mg/dL (ref 0.3–1.2)
Total Protein: 5.3 g/dL — ABNORMAL LOW (ref 6.5–8.1)

## 2021-06-15 LAB — LACTATE DEHYDROGENASE: LDH: 215 U/L — ABNORMAL HIGH (ref 98–192)

## 2021-06-15 MED ORDER — SODIUM CHLORIDE 0.9 % IV SOLN
300.0000 mg/m2 | Freq: Once | INTRAVENOUS | Status: AC
  Administered 2021-06-15: 480 mg via INTRAVENOUS
  Filled 2021-06-15: qty 24

## 2021-06-15 MED ORDER — SODIUM CHLORIDE 0.9 % IV SOLN
Freq: Once | INTRAVENOUS | Status: AC
Start: 2021-06-15 — End: 2021-06-15
  Filled 2021-06-15: qty 250

## 2021-06-15 MED ORDER — DEXAMETHASONE 4 MG PO TABS
10.0000 mg | ORAL_TABLET | Freq: Once | ORAL | Status: AC
  Administered 2021-06-15: 10 mg via ORAL

## 2021-06-15 MED ORDER — DEXAMETHASONE 4 MG PO TABS
ORAL_TABLET | ORAL | Status: AC
  Filled 2021-06-15: qty 3
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
## 2021-06-15 NOTE — Patient Instructions (Signed)
[facility]  Discharge Instructions: Thank you for choosing [facility] to provide your oncology and hematology care.   If you have a lab appointment with the [facility], please go directly to the [facility] and check in at the registration area.   Wear comfortable clothing and clothing appropriate for easy access to any Portacath or PICC line.   We strive to give you quality time with your provider. You may need to reschedule your appointment if you arrive late (15 or more minutes).  Arriving late affects you and other patients whose appointments are after yours.  Also, if you miss three or more appointments without notifying the office, you may be dismissed from the clinic at the provider's discretion.      For prescription refill requests, have your pharmacy contact our office and allow 72 hours for refills to be completed.    Today you received the following chemotherapy and/or immunotherapy agents Cytoxan    To help prevent nausea and vomiting after your treatment, we encourage you to take your nausea medication as directed.  BELOW ARE SYMPTOMS THAT SHOULD BE REPORTED IMMEDIATELY: *FEVER GREATER THAN 100.4 F (38 C) OR HIGHER *CHILLS OR SWEATING *NAUSEA AND VOMITING THAT IS NOT CONTROLLED WITH YOUR NAUSEA MEDICATION *UNUSUAL SHORTNESS OF BREATH *UNUSUAL BRUISING OR BLEEDING *URINARY PROBLEMS (pain or burning when urinating, or frequent urination) *BOWEL PROBLEMS (unusual diarrhea, constipation, pain near the anus) TENDERNESS IN MOUTH AND THROAT WITH OR WITHOUT PRESENCE OF ULCERS (sore throat, sores in mouth, or a toothache) UNUSUAL RASH, SWELLING OR PAIN  UNUSUAL VAGINAL DISCHARGE OR ITCHING   Items with * indicate a potential emergency and should be followed up as soon as possible or go to the Emergency Department if any problems should occur.  Please show the CHEMOTHERAPY ALERT CARD or IMMUNOTHERAPY ALERT CARD at check-in to the  Emergency Department and triage nurse.  Should you have questions after your visit or need to cancel or reschedule your appointment, please contact [facility]  Dept: [phone]  and follow the prompts.  Office hours are 8:00 a.m. to 4:30 p.m. Monday - Friday. Please note that voicemails left after 4:00 p.m. may not be returned until the following business day.  We are closed weekends and major holidays. You have access to a nurse at all times for urgent questions. Please call the main number to the clinic Dept: [phone] and follow the prompts.   For any non-urgent questions, you may also contact your provider using MyChart. We now offer e-Visits for anyone 77 and older to request care online for non-urgent symptoms. For details visit [email or web address].   Also download the MyChart app! Go to the app store, search "MyChart", open the app, select , and log in with your MyChart username and password.  Due to Covid, a mask is required upon entering the hospital/clinic. If you do not have a mask, one will be given to you upon arrival. For doctor visits, patients may have 1 support person aged 77 or older with them. For treatment visits, patients cannot have anyone with them due to current Covid guidelines and our immunocompromised population.

## 2021-06-21 ENCOUNTER — Other Ambulatory Visit: Payer: Self-pay | Admitting: *Deleted

## 2021-06-21 NOTE — Telephone Encounter (Signed)
PLEASE NOTE: All timestamps contained within this report are represented as [place] Standard Time. CONFIDENTIALTY NOTICE: This fax transmission is intended only for the addressee. It contains information that is legally privileged, confidential or otherwise protected from use or disclosure. If you are not the intended recipient, you are strictly prohibited from reviewing, disclosing, copying using or disseminating any of this information or taking any action in reliance on or regarding this information. If you have received this fax in error, please notify us immediately by telephone so that we can arrange for its return to [place]. Phone: [phone], Toll-Free: [phone], Fax: [phone] Page: 1 of 1 Call Id: [id number] [facility] - Client Nonclinical Telephone Record  [facility] Client [facility] - Client Client Site [facility] Physician Glori Bickers, Guthrie MD Contact Type Call Who Is Calling Patient / Member / Family / Caregiver Caller Name [place] Phone Number [phone] Patient Name Wendy [name] Patient DOB 16-Apr-1946 Call Type Message Only Information Provided Reason for Call Medication Question / Request Initial Comment caller needing a refill of Eliquis / the medication was prescribe by the hospitalist at [facility] and she's needing it refilled by her PCP Disp. Time Disposition Final User 06/21/2021 8:09:16 AM General Information Provided Yes Kenton Kingfisher, Lanette Call Closed By: Nelia Shi Transaction Date/Time: 06/21/2021 8:06:19 AM (ET)

## 2021-06-21 NOTE — Telephone Encounter (Signed)
Pended to send  Not sure what pharmacy she needs

## 2021-06-22 ENCOUNTER — Other Ambulatory Visit: Payer: Self-pay

## 2021-06-22 ENCOUNTER — Inpatient Hospital Stay: Payer: Medicare Other

## 2021-06-22 VITALS — BP 121/83 | HR 72 | Temp 97.7°F | Resp 18

## 2021-06-22 DIAGNOSIS — E8581 Light chain (AL) amyloidosis: Secondary | ICD-10-CM

## 2021-06-22 DIAGNOSIS — Z5112 Encounter for antineoplastic immunotherapy: Secondary | ICD-10-CM | POA: Diagnosis not present

## 2021-06-22 LAB — CMP (CANCER CENTER ONLY)
ALT: 7 U/L (ref 0–44)
AST: 14 U/L — ABNORMAL LOW (ref 15–41)
Albumin: 2.7 g/dL — ABNORMAL LOW (ref 3.5–5.0)
Alkaline Phosphatase: 72 U/L (ref 38–126)
Anion gap: 7 (ref 5–15)
BUN: 15 mg/dL (ref 8–23)
CO2: 26 mmol/L (ref 22–32)
Calcium: 8.7 mg/dL — ABNORMAL LOW (ref 8.9–10.3)
Chloride: 100 mmol/L (ref 98–111)
Creatinine: 0.81 mg/dL (ref 0.44–1.00)
GFR, Estimated: >60 mL/min (ref >60)
Glucose, Bld: 100 mg/dL — ABNORMAL HIGH (ref 70–99)
Potassium: 4.1 mmol/L (ref 3.5–5.1)
Sodium: 133 mmol/L — ABNORMAL LOW (ref 135–145)
Total Bilirubin: 0.4 mg/dL (ref 0.3–1.2)
Total Protein: 5.3 g/dL — ABNORMAL LOW (ref 6.5–8.1)

## 2021-06-22 LAB — CBC WITH DIFFERENTIAL (CANCER CENTER ONLY)
Abs Immature Granulocytes: 0.03 10*3/uL (ref 0.00–0.07)
Basophils Absolute: 0.1 10*3/uL (ref 0.0–0.1)
Basophils Relative: 1 %
Eosinophils Absolute: 0.1 10*3/uL (ref 0.0–0.5)
Eosinophils Relative: 2 %
HCT: 29.9 % — ABNORMAL LOW (ref 36.0–46.0)
Hemoglobin: 10.7 g/dL — ABNORMAL LOW (ref 12.0–15.0)
Immature Granulocytes: 1 %
Lymphocytes Relative: 5 %
Lymphs Abs: 0.3 10*3/uL — ABNORMAL LOW (ref 0.7–4.0)
MCH: 35.5 pg — ABNORMAL HIGH (ref 26.0–34.0)
MCHC: 35.8 g/dL (ref 30.0–36.0)
MCV: 99.3 fL (ref 80.0–100.0)
Monocytes Absolute: 0.6 10*3/uL (ref 0.1–1.0)
Monocytes Relative: 10 %
Neutro Abs: 4.6 10*3/uL (ref 1.7–7.7)
Neutrophils Relative %: 81 %
Platelet Count: 289 10*3/uL (ref 150–400)
RBC: 3.01 MIL/uL — ABNORMAL LOW (ref 3.87–5.11)
RDW: 16.1 % — ABNORMAL HIGH (ref 11.5–15.5)
WBC Count: 5.6 10*3/uL (ref 4.0–10.5)
nRBC: 0 % (ref 0.0–0.2)

## 2021-06-22 LAB — LACTATE DEHYDROGENASE: LDH: 232 U/L — ABNORMAL HIGH (ref 98–192)

## 2021-06-22 MED ORDER — ACETAMINOPHEN 325 MG PO TABS
650.0000 mg | ORAL_TABLET | Freq: Once | ORAL | Status: AC
Start: 2021-06-22 — End: 2021-06-22
  Administered 2021-06-22: 650 mg via ORAL

## 2021-06-22 MED ORDER — SODIUM CHLORIDE 0.9 % IV SOLN
300.0000 mg/m2 | Freq: Once | INTRAVENOUS | Status: AC
  Administered 2021-06-22: 480 mg via INTRAVENOUS
  Filled 2021-06-22: qty 24

## 2021-06-22 MED ORDER — APIXABAN 5 MG PO TABS: 5.0000 mg | ORAL_TABLET | Freq: Two times a day (BID) | ORAL | 3 refills | Status: DC

## 2021-06-22 MED ORDER — DEXAMETHASONE 4 MG PO TABS
10.0000 mg | ORAL_TABLET | Freq: Once | ORAL | Status: AC
  Administered 2021-06-22: 10 mg via ORAL

## 2021-06-22 MED ORDER — DEXAMETHASONE 4 MG PO TABS
ORAL_TABLET | ORAL | Status: AC
  Filled 2021-06-22: qty 3

## 2021-06-22 MED ORDER — DARATUMUMAB-HYALURONIDASE-FIHJ 1800-30000 MG-UT/15ML ~~LOC~~ SOLN
1800.0000 mg | Freq: Once | SUBCUTANEOUS | Status: AC
  Administered 2021-06-22: 1800 mg via SUBCUTANEOUS
  Filled 2021-06-22: qty 15

## 2021-06-22 MED ORDER — SODIUM CHLORIDE 0.9 % IV SOLN
Freq: Once | INTRAVENOUS | Status: AC
  Filled 2021-06-22: qty 250

## 2021-06-22 MED ORDER — ACETAMINOPHEN 325 MG PO TABS
ORAL_TABLET | ORAL | Status: AC
  Filled 2021-06-22: qty 2

## 2021-06-22 NOTE — Telephone Encounter (Signed)
Spoke with daughter.  They use [facility] for her Eliquis.  Refills sent

## 2021-06-23 ENCOUNTER — Other Ambulatory Visit: Payer: Self-pay | Admitting: Family Medicine

## 2021-06-27 NOTE — Telephone Encounter (Signed)
  LAST APPOINTMENT DATE: 06/21/2021   NEXT APPOINTMENT DATE:@10 /28/2022  MEDICATION: Lorrin Mais  PHARMACY: [facility]  Let patient know to contact pharmacy at the end of the day to make sure medication is ready.  Please notify patient to allow 48-72 hours to process  Encourage patient to contact the pharmacy for refills or they can request refills through [facility]:   LAST REFILL:  QTY:  REFILL DATE:    OTHER COMMENTS:    Okay for refill?  Please advise

## 2021-06-27 NOTE — Telephone Encounter (Signed)
Name of Medication: Ambien Name of Pharmacy: [facility] or Written Date and Quantity:02/21/21 #30 tabs 3 refills Last Office Visit and Type: Hospital f/u 05/31/21 Next Office Visit and Type: CPE on 10/27/21 Last Controlled Substance Agreement Date: 05/27/13 Last UDS:05/27/13

## 2021-06-29 ENCOUNTER — Inpatient Hospital Stay: Payer: Medicare Other

## 2021-06-29 ENCOUNTER — Inpatient Hospital Stay: Payer: Medicare Other | Attending: Hematology and Oncology | Admitting: Hematology and Oncology

## 2021-06-29 ENCOUNTER — Encounter: Payer: Self-pay | Admitting: Hematology and Oncology

## 2021-06-29 ENCOUNTER — Other Ambulatory Visit: Payer: Self-pay

## 2021-06-29 VITALS — BP 133/66 | HR 77 | Temp 98.1°F | Resp 17 | Ht 62.0 in | Wt 117.3 lb

## 2021-06-29 DIAGNOSIS — E8581 Light chain (AL) amyloidosis: Secondary | ICD-10-CM

## 2021-06-29 DIAGNOSIS — Z5111 Encounter for antineoplastic chemotherapy: Secondary | ICD-10-CM | POA: Insufficient documentation

## 2021-06-29 DIAGNOSIS — R803 Bence Jones proteinuria: Secondary | ICD-10-CM | POA: Diagnosis not present

## 2021-06-29 DIAGNOSIS — Z79899 Other long term (current) drug therapy: Secondary | ICD-10-CM | POA: Diagnosis not present

## 2021-06-29 LAB — CBC WITH DIFFERENTIAL (CANCER CENTER ONLY)
Abs Immature Granulocytes: 0.03 10*3/uL (ref 0.00–0.07)
Basophils Absolute: 0 10*3/uL (ref 0.0–0.1)
Basophils Relative: 1 %
Eosinophils Absolute: 0.2 10*3/uL (ref 0.0–0.5)
Eosinophils Relative: 3 %
HCT: 30.5 % — ABNORMAL LOW (ref 36.0–46.0)
Hemoglobin: 10.8 g/dL — ABNORMAL LOW (ref 12.0–15.0)
Immature Granulocytes: 1 %
Lymphocytes Relative: 4 %
Lymphs Abs: 0.3 10*3/uL — ABNORMAL LOW (ref 0.7–4.0)
MCH: 35.5 pg — ABNORMAL HIGH (ref 26.0–34.0)
MCHC: 35.4 g/dL (ref 30.0–36.0)
MCV: 100.3 fL — ABNORMAL HIGH (ref 80.0–100.0)
Monocytes Absolute: 0.6 10*3/uL (ref 0.1–1.0)
Monocytes Relative: 9 %
Neutro Abs: 4.9 10*3/uL (ref 1.7–7.7)
Neutrophils Relative %: 82 %
Platelet Count: 306 10*3/uL (ref 150–400)
RBC: 3.04 MIL/uL — ABNORMAL LOW (ref 3.87–5.11)
RDW: 15.6 % — ABNORMAL HIGH (ref 11.5–15.5)
WBC Count: 5.9 10*3/uL (ref 4.0–10.5)
nRBC: 0 % (ref 0.0–0.2)

## 2021-06-29 LAB — CMP (CANCER CENTER ONLY)
ALT: 10 U/L (ref 0–44)
AST: 16 U/L (ref 15–41)
Albumin: 2.7 g/dL — ABNORMAL LOW (ref 3.5–5.0)
Alkaline Phosphatase: 76 U/L (ref 38–126)
Anion gap: 9 (ref 5–15)
BUN: 19 mg/dL (ref 8–23)
CO2: 24 mmol/L (ref 22–32)
Calcium: 8.6 mg/dL — ABNORMAL LOW (ref 8.9–10.3)
Chloride: 101 mmol/L (ref 98–111)
Creatinine: 0.82 mg/dL (ref 0.44–1.00)
GFR, Estimated: >60 mL/min (ref >60)
Glucose, Bld: 102 mg/dL — ABNORMAL HIGH (ref 70–99)
Potassium: 4.2 mmol/L (ref 3.5–5.1)
Sodium: 134 mmol/L — ABNORMAL LOW (ref 135–145)
Total Bilirubin: 0.4 mg/dL (ref 0.3–1.2)
Total Protein: 5.5 g/dL — ABNORMAL LOW (ref 6.5–8.1)

## 2021-06-29 LAB — LACTATE DEHYDROGENASE: LDH: 234 U/L — ABNORMAL HIGH (ref 98–192)

## 2021-06-29 MED ORDER — SODIUM CHLORIDE 0.9 % IV SOLN
300.0000 mg/m2 | Freq: Once | INTRAVENOUS | Status: AC
  Administered 2021-06-29: 480 mg via INTRAVENOUS
  Filled 2021-06-29: qty 24

## 2021-06-29 MED ORDER — SODIUM CHLORIDE 0.9 % IV SOLN
Freq: Once | INTRAVENOUS | Status: AC
  Filled 2021-06-29: qty 250

## 2021-06-29 MED ORDER — DEXAMETHASONE 4 MG PO TABS
10.0000 mg | ORAL_TABLET | Freq: Once | ORAL | Status: AC
Start: 2021-06-29 — End: 2021-06-29
  Administered 2021-06-29: 10 mg via ORAL

## 2021-06-29 MED ORDER — DEXAMETHASONE 4 MG PO TABS
ORAL_TABLET | ORAL | Status: AC
  Filled 2021-06-29: qty 3

## 2021-06-29 NOTE — Patient Instructions (Addendum)
[facility]  Discharge Instructions: [facility] you for choosing [facility] to provide your oncology and hematology care.   If you have a lab appointment with the [facility], please go directly to the [facility] and check in at the registration area.   Wear comfortable clothing and clothing appropriate for easy access to any Portacath or PICC line.   We strive to give you quality time with your provider. You may need to reschedule your appointment if you arrive late (15 or more minutes).  Arriving late affects you and other patients whose appointments are after yours.  Also, if you miss three or more appointments without notifying the office, you may be dismissed from the clinic at the provider's discretion.      For prescription refill requests, have your pharmacy contact our office and allow 72 hours for refills to be completed.    Today you received the following chemotherapy and/or immunotherapy agent: Cyclophosphamide (Cytoxan).   To help prevent nausea and vomiting after your treatment, we encourage you to take your nausea medication as directed.  BELOW ARE SYMPTOMS THAT SHOULD BE REPORTED IMMEDIATELY: *FEVER GREATER THAN 100.4 F (38 C) OR HIGHER *CHILLS OR SWEATING *NAUSEA AND VOMITING THAT IS NOT CONTROLLED WITH YOUR NAUSEA MEDICATION *UNUSUAL SHORTNESS OF BREATH *UNUSUAL BRUISING OR BLEEDING *URINARY PROBLEMS (pain or burning when urinating, or frequent urination) *BOWEL PROBLEMS (unusual diarrhea, constipation, pain near the anus) TENDERNESS IN MOUTH AND THROAT WITH OR WITHOUT PRESENCE OF ULCERS (sore throat, sores in mouth, or a toothache) UNUSUAL RASH, SWELLING OR PAIN  UNUSUAL VAGINAL DISCHARGE OR ITCHING   Items with * indicate a potential emergency and should be followed up as soon as possible or go to the Emergency Department if any problems should occur.  Please show the CHEMOTHERAPY ALERT CARD or IMMUNOTHERAPY ALERT CARD  at check-in to the Emergency Department and triage nurse.  Should you have questions after your visit or need to cancel or reschedule your appointment, please contact [facility]  Dept: [phone]  and follow the prompts.  Office hours are 8:00 a.m. to 4:30 p.m. Monday - Friday. Please note that voicemails left after 4:00 p.m. may not be returned until the following business day.  We are closed weekends and major holidays. You have access to a nurse at all times for urgent questions. Please call the main number to the clinic Dept: [phone] and follow the prompts.   For any non-urgent questions, you may also contact your provider using MyChart. We now offer e-Visits for anyone 86 and older to request care online for non-urgent symptoms. For details visit [email or web address].   Also download the MyChart app! Go to the app store, search "MyChart", open the app, select [facility], and log in with your MyChart username and password.  Due to Covid, a mask is required upon entering the hospital/clinic. If you do not have a mask, one will be given to you upon arrival. For doctor visits, patients may have 1 support person aged 63 or older with them. For treatment visits, patients cannot have anyone with them due to current Covid guidelines and our immunocompromised population.
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
## 2021-07-02 LAB — KAPPA/LAMBDA LIGHT CHAINS
Kappa free light chain: 7.4 mg/L (ref 3.3–19.4)
Kappa, lambda light chain ratio: 0.16 — ABNORMAL LOW (ref 0.26–1.65)
Lambda free light chains: 45.7 mg/L — ABNORMAL HIGH (ref 5.7–26.3)

## 2021-07-03 LAB — MULTIPLE MYELOMA PANEL, SERUM
Albumin SerPl Elph-Mcnc: 2.8 g/dL — ABNORMAL LOW (ref 2.9–4.4)
Albumin/Glob SerPl: 1.4 (ref 0.7–1.7)
Alpha 1: 0.3 g/dL (ref 0.0–0.4)
Alpha2 Glob SerPl Elph-Mcnc: 0.7 g/dL (ref 0.4–1.0)
B-Globulin SerPl Elph-Mcnc: 0.8 g/dL (ref 0.7–1.3)
Gamma Glob SerPl Elph-Mcnc: 0.3 g/dL — ABNORMAL LOW (ref 0.4–1.8)
Globulin, Total: 2.1 g/dL — ABNORMAL LOW (ref 2.2–3.9)
IgA: 38 mg/dL — ABNORMAL LOW (ref 64–422)
IgG (Immunoglobin G), Serum: 285 mg/dL — ABNORMAL LOW (ref 586–1602)
IgM (Immunoglobulin M), Srm: 27 mg/dL (ref 26–217)
M Protein SerPl Elph-Mcnc: 0.1 g/dL — ABNORMAL HIGH
Total Protein ELP: 4.9 g/dL — ABNORMAL LOW (ref 6.0–8.5)

## 2021-07-06 ENCOUNTER — Other Ambulatory Visit: Payer: Self-pay | Admitting: *Deleted

## 2021-07-06 ENCOUNTER — Encounter (HOSPITAL_COMMUNITY): Payer: Self-pay

## 2021-07-06 ENCOUNTER — Inpatient Hospital Stay: Payer: Medicare Other

## 2021-07-06 ENCOUNTER — Telehealth: Payer: Self-pay | Admitting: Hematology and Oncology

## 2021-07-06 ENCOUNTER — Emergency Department (HOSPITAL_COMMUNITY): Payer: Medicare Other

## 2021-07-06 ENCOUNTER — Emergency Department (HOSPITAL_COMMUNITY)
Admission: EM | Admit: 2021-07-06 | Discharge: 2021-07-06 | Disposition: A | Discharge status: Home / Self Care | Payer: Medicare Other | Attending: Emergency Medicine | Admitting: Emergency Medicine

## 2021-07-06 ENCOUNTER — Other Ambulatory Visit: Payer: Self-pay

## 2021-07-06 ENCOUNTER — Other Ambulatory Visit: Payer: Self-pay | Admitting: Hematology and Oncology

## 2021-07-06 DIAGNOSIS — Z87891 Personal history of nicotine dependence: Secondary | ICD-10-CM | POA: Diagnosis not present

## 2021-07-06 DIAGNOSIS — Z79899 Other long term (current) drug therapy: Secondary | ICD-10-CM | POA: Insufficient documentation

## 2021-07-06 DIAGNOSIS — I2510 Atherosclerotic heart disease of native coronary artery without angina pectoris: Secondary | ICD-10-CM | POA: Diagnosis not present

## 2021-07-06 DIAGNOSIS — J810 Acute pulmonary edema: Secondary | ICD-10-CM | POA: Diagnosis not present

## 2021-07-06 DIAGNOSIS — R0603 Acute respiratory distress: Secondary | ICD-10-CM

## 2021-07-06 DIAGNOSIS — E8581 Light chain (AL) amyloidosis: Secondary | ICD-10-CM

## 2021-07-06 DIAGNOSIS — I10 Essential (primary) hypertension: Secondary | ICD-10-CM | POA: Insufficient documentation

## 2021-07-06 DIAGNOSIS — Z7901 Long term (current) use of anticoagulants: Secondary | ICD-10-CM | POA: Insufficient documentation

## 2021-07-06 DIAGNOSIS — Z20822 Contact with and (suspected) exposure to covid-19: Secondary | ICD-10-CM | POA: Insufficient documentation

## 2021-07-06 DIAGNOSIS — Z5111 Encounter for antineoplastic chemotherapy: Secondary | ICD-10-CM | POA: Diagnosis not present

## 2021-07-06 LAB — CBC WITH DIFFERENTIAL/PLATELET
Abs Immature Granulocytes: 0.08 10*3/uL — ABNORMAL HIGH (ref 0.00–0.07)
Basophils Absolute: 0.1 10*3/uL (ref 0.0–0.1)
Basophils Relative: 1 %
Eosinophils Absolute: 0.1 10*3/uL (ref 0.0–0.5)
Eosinophils Relative: 1 %
HCT: 36.6 % (ref 36.0–46.0)
Hemoglobin: 12.5 g/dL (ref 12.0–15.0)
Immature Granulocytes: 1 %
Lymphocytes Relative: 2 %
Lymphs Abs: 0.2 10*3/uL — ABNORMAL LOW (ref 0.7–4.0)
MCH: 35.6 pg — ABNORMAL HIGH (ref 26.0–34.0)
MCHC: 34.2 g/dL (ref 30.0–36.0)
MCV: 104.3 fL — ABNORMAL HIGH (ref 80.0–100.0)
Monocytes Absolute: 0.5 10*3/uL (ref 0.1–1.0)
Monocytes Relative: 5 %
Neutro Abs: 8.4 10*3/uL — ABNORMAL HIGH (ref 1.7–7.7)
Neutrophils Relative %: 90 %
Platelets: 344 10*3/uL (ref 150–400)
RBC: 3.51 MIL/uL — ABNORMAL LOW (ref 3.87–5.11)
RDW: 15.6 % — ABNORMAL HIGH (ref 11.5–15.5)
WBC: 9.3 10*3/uL (ref 4.0–10.5)
nRBC: 0 % (ref 0.0–0.2)

## 2021-07-06 LAB — COMPREHENSIVE METABOLIC PANEL
ALT: 18 U/L (ref 0–44)
AST: 33 U/L (ref 15–41)
Albumin: 3 g/dL — ABNORMAL LOW (ref 3.5–5.0)
Alkaline Phosphatase: 76 U/L (ref 38–126)
Anion gap: 6 (ref 5–15)
BUN: 17 mg/dL (ref 8–23)
CO2: 25 mmol/L (ref 22–32)
Calcium: 8 mg/dL — ABNORMAL LOW (ref 8.9–10.3)
Chloride: 102 mmol/L (ref 98–111)
Creatinine, Ser: 0.81 mg/dL (ref 0.44–1.00)
GFR, Estimated: >60 mL/min (ref >60)
Glucose, Bld: 161 mg/dL — ABNORMAL HIGH (ref 70–99)
Potassium: 3.7 mmol/L (ref 3.5–5.1)
Sodium: 133 mmol/L — ABNORMAL LOW (ref 135–145)
Total Bilirubin: 0.6 mg/dL (ref 0.3–1.2)
Total Protein: 5.7 g/dL — ABNORMAL LOW (ref 6.5–8.1)

## 2021-07-06 LAB — RESP PANEL BY RT-PCR (FLU A&B, COVID) ARPGX2
Influenza A by PCR: NEGATIVE
Influenza B by PCR: NEGATIVE
SARS Coronavirus 2 by RT PCR: NEGATIVE

## 2021-07-06 LAB — LACTIC ACID, PLASMA: Lactic Acid, Venous: 1.5 mmol/L (ref 0.5–1.9)

## 2021-07-06 LAB — BRAIN NATRIURETIC PEPTIDE: B Natriuretic Peptide: 795.2 pg/mL — ABNORMAL HIGH (ref 0.0–100.0)

## 2021-07-06 IMAGING — DX DG CHEST 1V PORT
1 series · 1 of 1 positions shown · non-contrast
Comparison: Chest CT [DATE] and earlier.

CLINICAL DATA: 74-year-old female with shortness of breath since
yesterday. Hypoxia. Abnormal pulmonary auscultation.

EXAM:
PORTABLE CHEST 1 VIEW

[chest ap]
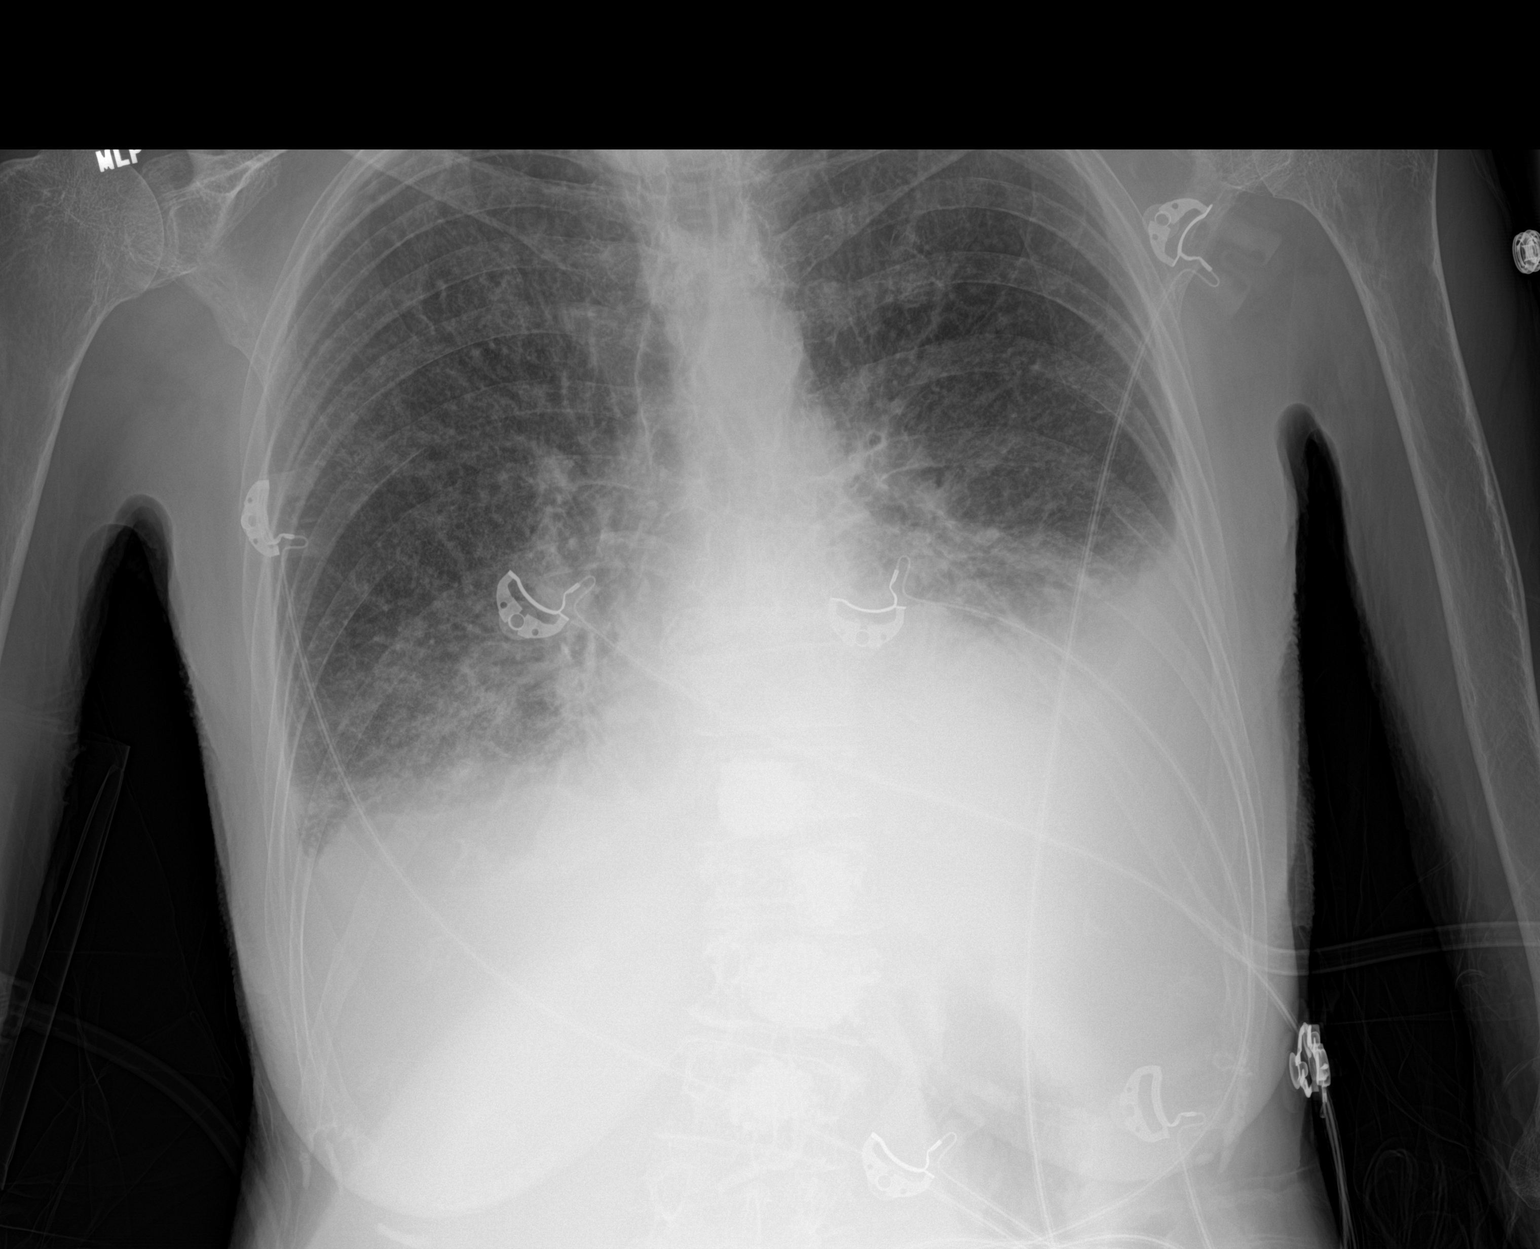

[1 of 1 positions shown; findings below may reference images not displayed]

FINDINGS: AP view at [QA] hours. Continued pleural effusions greater on the
left, and diffuse pulmonary interstitial edema. Vascular congestion
is slightly less than that demonstrated [REDACTED]. Stable cardiac size
and mediastinal contours. No pneumothorax. Multilevel prior
compression fractures and vertebral augmentation. Paucity of bowel
gas.
IMPRESSION: Acute pulmonary edema. Persistent left greater than right pleural
effusions, not significantly changed since [DATE].

## 2021-07-06 MED ORDER — SODIUM CHLORIDE 0.9 % IV BOLUS
1000.0000 mL | Freq: Once | INTRAVENOUS | Status: AC
Start: 2021-07-06 — End: 2021-07-06
  Administered 2021-07-06: 1000 mL via INTRAVENOUS

## 2021-07-06 MED ORDER — BUMETANIDE 0.5 MG PO TABS: 0.5000 mg | ORAL_TABLET | Freq: Every day | ORAL | 0 refills | Status: DC

## 2021-07-06 MED ORDER — METOPROLOL TARTRATE 5 MG/5ML IV SOLN
5.0000 mg | INTRAVENOUS | Status: DC | PRN
  Administered 2021-07-06: 5 mg via INTRAVENOUS
  Filled 2021-07-06: qty 5

## 2021-07-06 MED ORDER — ALBUTEROL SULFATE (2.5 MG/3ML) 0.083% IN NEBU
5.0000 mg | INHALATION_SOLUTION | Freq: Once | RESPIRATORY_TRACT | Status: AC
  Administered 2021-07-06: 5 mg via RESPIRATORY_TRACT
  Filled 2021-07-06: qty 6
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
## 2021-07-06 NOTE — ED Triage Notes (Signed)
Pt BIB EMS. Pt c/o SOB that started yesterday that progressively got worse overnight. Per EMS rales in bilateral lungs, pt was stating in low 80s. Pt put on CPAP, given 1 nitro, 10 albuterol and  atrovent. SxS improved O2 increased to 94%. Pt is a cancer pt and was suppose to go to [facility] today. Pt does not wear oxygen at home.

## 2021-07-06 NOTE — Discharge Instructions (Addendum)
As discussed, with today's episode of respiratory distress, which is likely due to flash pulmonary edema is importantly follow-up with your physician and consider your medication regimen.  Although you have improved substantially, do not hesitate to return here if you develop new, or concerning changes in your condition.  For the next 7 days please take Bumex daily rather than as needed.

## 2021-07-06 NOTE — Telephone Encounter (Signed)
Cancelled per pt in hospital, pt daughter informed.

## 2021-07-06 NOTE — Progress Notes (Signed)
Patient took off BIPAP and placed on 2lpm for a break. Patient tolerating well at this time

## 2021-07-06 NOTE — ED Notes (Signed)
RT called for bipap order per MD.

## 2021-07-07 ENCOUNTER — Telehealth: Payer: Self-pay | Admitting: *Deleted

## 2021-07-07 ENCOUNTER — Other Ambulatory Visit: Payer: Self-pay | Admitting: Cardiovascular Disease

## 2021-07-07 NOTE — Telephone Encounter (Signed)
TCT patient to follow up with her after ED visit yesterday for flash pulmonary edema. Pt was discharged home after she stabilized. Spoke with her. She states she feels ok today. Denies any SOB, chest pain. States she is just very tired.  She is aware she missed her infusion here yesterday. Advised that we will see her next week as scheduled with labs, MD visit and her treatment if she remains stabile.  Pt voiced understanding. She is aware and understands when to go to ED and when to call us.

## 2021-07-10 LAB — UPEP/UIFE/LIGHT CHAINS/TP, 24-HR UR
% BETA, Urine: 6.4 %
ALPHA 1 URINE: 3.9 %
Albumin, U: 83.6 %
Alpha 2, Urine: 3.8 %
Free Kappa Lt Chains,Ur: 5.56 mg/L (ref 1.17–86.46)
Free Kappa/Lambda Ratio: 0.43 — ABNORMAL LOW (ref 1.83–14.26)
Free Lambda Lt Chains,Ur: 13.01 mg/L (ref 0.27–15.21)
GAMMA GLOBULIN URINE: 2.3 %
Total Protein, Urine-Ur/day: 1212 mg/24 hr — ABNORMAL HIGH (ref 30–150)
Total Protein, Urine: 173.1 mg/dL
Total Volume: 700

## 2021-07-13 ENCOUNTER — Inpatient Hospital Stay (HOSPITAL_BASED_OUTPATIENT_CLINIC_OR_DEPARTMENT_OTHER): Payer: Medicare Other | Admitting: Hematology and Oncology

## 2021-07-13 ENCOUNTER — Inpatient Hospital Stay: Payer: Medicare Other

## 2021-07-13 ENCOUNTER — Other Ambulatory Visit: Payer: Self-pay

## 2021-07-13 VITALS — BP 138/70 | HR 73 | Temp 98.1°F | Resp 16 | Ht 62.0 in | Wt 117.4 lb

## 2021-07-13 DIAGNOSIS — E8581 Light chain (AL) amyloidosis: Secondary | ICD-10-CM | POA: Diagnosis not present

## 2021-07-13 DIAGNOSIS — R803 Bence Jones proteinuria: Secondary | ICD-10-CM | POA: Diagnosis not present

## 2021-07-13 DIAGNOSIS — R1900 Intra-abdominal and pelvic swelling, mass and lump, unspecified site: Secondary | ICD-10-CM

## 2021-07-13 DIAGNOSIS — Z5111 Encounter for antineoplastic chemotherapy: Secondary | ICD-10-CM | POA: Diagnosis not present

## 2021-07-13 LAB — CBC WITH DIFFERENTIAL (CANCER CENTER ONLY)
Abs Immature Granulocytes: 0.01 10*3/uL (ref 0.00–0.07)
Basophils Absolute: 0 10*3/uL (ref 0.0–0.1)
Basophils Relative: 1 %
Eosinophils Absolute: 0.2 10*3/uL (ref 0.0–0.5)
Eosinophils Relative: 3 %
HCT: 31.9 % — ABNORMAL LOW (ref 36.0–46.0)
Hemoglobin: 11.4 g/dL — ABNORMAL LOW (ref 12.0–15.0)
Immature Granulocytes: 0 %
Lymphocytes Relative: 6 %
Lymphs Abs: 0.3 10*3/uL — ABNORMAL LOW (ref 0.7–4.0)
MCH: 35.3 pg — ABNORMAL HIGH (ref 26.0–34.0)
MCHC: 35.7 g/dL (ref 30.0–36.0)
MCV: 98.8 fL (ref 80.0–100.0)
Monocytes Absolute: 0.7 10*3/uL (ref 0.1–1.0)
Monocytes Relative: 12 %
Neutro Abs: 4.6 10*3/uL (ref 1.7–7.7)
Neutrophils Relative %: 78 %
Platelet Count: 273 10*3/uL (ref 150–400)
RBC: 3.23 MIL/uL — ABNORMAL LOW (ref 3.87–5.11)
RDW: 14.5 % (ref 11.5–15.5)
WBC Count: 5.8 10*3/uL (ref 4.0–10.5)
nRBC: 0 % (ref 0.0–0.2)

## 2021-07-13 LAB — CMP (CANCER CENTER ONLY)
ALT: 10 U/L (ref 0–44)
AST: 17 U/L (ref 15–41)
Albumin: 3 g/dL — ABNORMAL LOW (ref 3.5–5.0)
Alkaline Phosphatase: 66 U/L (ref 38–126)
Anion gap: 8 (ref 5–15)
BUN: 17 mg/dL (ref 8–23)
CO2: 27 mmol/L (ref 22–32)
Calcium: 9 mg/dL (ref 8.9–10.3)
Chloride: 99 mmol/L (ref 98–111)
Creatinine: 0.71 mg/dL (ref 0.44–1.00)
GFR, Estimated: >60 mL/min (ref >60)
Glucose, Bld: 91 mg/dL (ref 70–99)
Potassium: 3.8 mmol/L (ref 3.5–5.1)
Sodium: 134 mmol/L — ABNORMAL LOW (ref 135–145)
Total Bilirubin: 0.6 mg/dL (ref 0.3–1.2)
Total Protein: 5.4 g/dL — ABNORMAL LOW (ref 6.5–8.1)

## 2021-07-13 LAB — LACTATE DEHYDROGENASE: LDH: 204 U/L — ABNORMAL HIGH (ref 98–192)

## 2021-07-13 MED ORDER — DARATUMUMAB-HYALURONIDASE-FIHJ 1800-30000 MG-UT/15ML ~~LOC~~ SOLN
1800.0000 mg | Freq: Once | SUBCUTANEOUS | Status: AC
  Administered 2021-07-13: 1800 mg via SUBCUTANEOUS
  Filled 2021-07-13: qty 15

## 2021-07-13 MED ORDER — SODIUM CHLORIDE 0.9 % IV SOLN
300.0000 mg/m2 | Freq: Once | INTRAVENOUS | Status: AC
  Administered 2021-07-13: 480 mg via INTRAVENOUS
  Filled 2021-07-13: qty 24

## 2021-07-13 MED ORDER — DEXAMETHASONE 4 MG PO TABS
10.0000 mg | ORAL_TABLET | Freq: Once | ORAL | Status: AC
  Administered 2021-07-13: 10 mg via ORAL

## 2021-07-13 MED ORDER — ACETAMINOPHEN 325 MG PO TABS
650.0000 mg | ORAL_TABLET | Freq: Once | ORAL | Status: AC
  Administered 2021-07-13: 650 mg via ORAL

## 2021-07-13 MED ORDER — DEXAMETHASONE 4 MG PO TABS
ORAL_TABLET | ORAL | Status: AC
  Filled 2021-07-13: qty 3

## 2021-07-13 MED ORDER — ACETAMINOPHEN 325 MG PO TABS
ORAL_TABLET | ORAL | Status: AC
  Filled 2021-07-13: qty 2

## 2021-07-13 MED ORDER — SODIUM CHLORIDE 0.9 % IV SOLN
Freq: Once | INTRAVENOUS | Status: AC
Start: 2021-07-13 — End: 2021-07-13
  Filled 2021-07-13: qty 250

## 2021-07-13 NOTE — Patient Instructions (Signed)
[facility] ONCOLOGY   Discharge Instructions: Thank you for choosing [facility] to provide your oncology and hematology care.   If you have a lab appointment with the [facility], please go directly to the [facility] and check in at the registration area.   Wear comfortable clothing and clothing appropriate for easy access to any Portacath or PICC line.   We strive to give you quality time with your provider. You may need to reschedule your appointment if you arrive late (15 or more minutes).  Arriving late affects you and other patients whose appointments are after yours.  Also, if you miss three or more appointments without notifying the office, you may be dismissed from the clinic at the provider's discretion.      For prescription refill requests, have your pharmacy contact our office and allow 72 hours for refills to be completed.    Today you received the following chemotherapy and/or immunotherapy agents: cyclophosphamide and daratumumab/hyaluronidase.      To help prevent nausea and vomiting after your treatment, we encourage you to take your nausea medication as directed.  BELOW ARE SYMPTOMS THAT SHOULD BE REPORTED IMMEDIATELY: *FEVER GREATER THAN 100.4 F (38 C) OR HIGHER *CHILLS OR SWEATING *NAUSEA AND VOMITING THAT IS NOT CONTROLLED WITH YOUR NAUSEA MEDICATION *UNUSUAL SHORTNESS OF BREATH *UNUSUAL BRUISING OR BLEEDING *URINARY PROBLEMS (pain or burning when urinating, or frequent urination) *BOWEL PROBLEMS (unusual diarrhea, constipation, pain near the anus) TENDERNESS IN MOUTH AND THROAT WITH OR WITHOUT PRESENCE OF ULCERS (sore throat, sores in mouth, or a toothache) UNUSUAL RASH, SWELLING OR PAIN  UNUSUAL VAGINAL DISCHARGE OR ITCHING   Items with * indicate a potential emergency and should be followed up as soon as possible or go to the Emergency Department if any problems should occur.  Please show the CHEMOTHERAPY ALERT CARD or  IMMUNOTHERAPY ALERT CARD at check-in to the Emergency Department and triage nurse.  Should you have questions after your visit or need to cancel or reschedule your appointment, please contact [facility]  Dept: [phone]  and follow the prompts.  Office hours are 8:00 a.m. to 4:30 p.m. Monday - Friday. Please note that voicemails left after 4:00 p.m. may not be returned until the following business day.  We are closed weekends and major holidays. You have access to a nurse at all times for urgent questions. Please call the main number to the clinic Dept: [phone] and follow the prompts.   For any non-urgent questions, you may also contact your provider using MyChart. We now offer e-Visits for anyone 67 and older to request care online for non-urgent symptoms. For details visit [email or web address].   Also download the MyChart app! Go to the app store, search "MyChart", open the app, select [facility], and log in with your MyChart username and password.  Due to Covid, a mask is required upon entering the hospital/clinic. If you do not have a mask, one will be given to you upon arrival. For doctor visits, patients may have 1 support person aged 69 or older with them. For treatment visits, patients cannot have anyone with them due to current Covid guidelines and our immunocompromised population.

## 2021-07-18 ENCOUNTER — Encounter: Payer: Self-pay | Admitting: Family Medicine

## 2021-07-18 ENCOUNTER — Ambulatory Visit (INDEPENDENT_AMBULATORY_CARE_PROVIDER_SITE_OTHER): Payer: Medicare Other | Admitting: Family Medicine

## 2021-07-18 ENCOUNTER — Other Ambulatory Visit: Payer: Self-pay

## 2021-07-18 VITALS — BP 110/68 | HR 86 | Temp 98.0°F | Ht 62.0 in | Wt 114.2 lb

## 2021-07-18 DIAGNOSIS — C9000 Multiple myeloma not having achieved remission: Secondary | ICD-10-CM | POA: Diagnosis not present

## 2021-07-18 DIAGNOSIS — I10 Essential (primary) hypertension: Secondary | ICD-10-CM | POA: Diagnosis not present

## 2021-07-18 DIAGNOSIS — R600 Localized edema: Secondary | ICD-10-CM | POA: Diagnosis not present

## 2021-07-18 DIAGNOSIS — J810 Acute pulmonary edema: Secondary | ICD-10-CM | POA: Diagnosis not present
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
## 2021-07-18 NOTE — Patient Instructions (Addendum)
Vitals look good  Chest sounds clear   Try to get in some more calories if you can  Perhaps keep trying a meal supplement for a snack (like boost) -find one you like the taste of  You may need to eat some even when you are not hungry   Glad you are doing better ! Watch closely for shortness of breath

## 2021-07-19 ENCOUNTER — Telehealth: Payer: Self-pay

## 2021-07-19 MED ORDER — METOPROLOL TARTRATE 25 MG PO TABS: ORAL_TABLET | Freq: Two times a day (BID) | ORAL | 1 refills | Status: DC

## 2021-07-19 NOTE — Assessment & Plan Note (Signed)
bp is back to baseline after episode of flash pulmonary edema with bp elevation  Reviewed hospital records, lab results and studies in detail  Continues bumex 0.5 mg and metoprolol 12.5 mg bid

## 2021-07-19 NOTE — Assessment & Plan Note (Signed)
Continues tx /chemo with oncology  Some episodes of flash pulmonary edema with this-most recent resolved Will continue f/u with onc

## 2021-07-19 NOTE — Assessment & Plan Note (Signed)
Recurrent in setting of cancer treatment  Most recent episode resolved quickly in ER on 7/14 Reviewed hospital records, lab results and studies in detail  Breathing is now normal along with pulse ox  Resolved with cpap and bipap Continues bumex Pt will continue to discuss with oncology

## 2021-07-19 NOTE — Assessment & Plan Note (Signed)
Suspect caused by treatment for multiple myeloma  Also episodes of flash pulmonary edema-now resolved On bumex 0.5 mg and lytes are stable

## 2021-07-19 NOTE — Telephone Encounter (Signed)
Pt needed a refill of metoprolol. Rx sent electronically.

## 2021-07-20 ENCOUNTER — Other Ambulatory Visit: Payer: Self-pay

## 2021-07-20 ENCOUNTER — Inpatient Hospital Stay: Payer: Medicare Other

## 2021-07-20 ENCOUNTER — Other Ambulatory Visit: Payer: Self-pay | Admitting: Hematology and Oncology

## 2021-07-20 VITALS — BP 124/68 | HR 69 | Temp 98.0°F | Resp 18 | Wt 115.2 lb

## 2021-07-20 DIAGNOSIS — E8581 Light chain (AL) amyloidosis: Secondary | ICD-10-CM

## 2021-07-20 DIAGNOSIS — Z5111 Encounter for antineoplastic chemotherapy: Secondary | ICD-10-CM | POA: Diagnosis not present

## 2021-07-20 LAB — CMP (CANCER CENTER ONLY)
ALT: <6 U/L (ref 0–44)
AST: 15 U/L (ref 15–41)
Albumin: 2.8 g/dL — ABNORMAL LOW (ref 3.5–5.0)
Alkaline Phosphatase: 84 U/L (ref 38–126)
Anion gap: 8 (ref 5–15)
BUN: 15 mg/dL (ref 8–23)
CO2: 25 mmol/L (ref 22–32)
Calcium: 8.6 mg/dL — ABNORMAL LOW (ref 8.9–10.3)
Chloride: 99 mmol/L (ref 98–111)
Creatinine: 0.86 mg/dL (ref 0.44–1.00)
GFR, Estimated: >60 mL/min (ref >60)
Glucose, Bld: 114 mg/dL — ABNORMAL HIGH (ref 70–99)
Potassium: 3.6 mmol/L (ref 3.5–5.1)
Sodium: 132 mmol/L — ABNORMAL LOW (ref 135–145)
Total Bilirubin: 0.4 mg/dL (ref 0.3–1.2)
Total Protein: 5.5 g/dL — ABNORMAL LOW (ref 6.5–8.1)

## 2021-07-20 LAB — CBC WITH DIFFERENTIAL (CANCER CENTER ONLY)
Abs Immature Granulocytes: 0.05 10*3/uL (ref 0.00–0.07)
Basophils Absolute: 0 10*3/uL (ref 0.0–0.1)
Basophils Relative: 1 %
Eosinophils Absolute: 0.1 10*3/uL (ref 0.0–0.5)
Eosinophils Relative: 2 %
HCT: 31.8 % — ABNORMAL LOW (ref 36.0–46.0)
Hemoglobin: 11 g/dL — ABNORMAL LOW (ref 12.0–15.0)
Immature Granulocytes: 1 %
Lymphocytes Relative: 4 %
Lymphs Abs: 0.2 10*3/uL — ABNORMAL LOW (ref 0.7–4.0)
MCH: 34.2 pg — ABNORMAL HIGH (ref 26.0–34.0)
MCHC: 34.6 g/dL (ref 30.0–36.0)
MCV: 98.8 fL (ref 80.0–100.0)
Monocytes Absolute: 0.6 10*3/uL (ref 0.1–1.0)
Monocytes Relative: 11 %
Neutro Abs: 4.5 10*3/uL (ref 1.7–7.7)
Neutrophils Relative %: 81 %
Platelet Count: 272 10*3/uL (ref 150–400)
RBC: 3.22 MIL/uL — ABNORMAL LOW (ref 3.87–5.11)
RDW: 14 % (ref 11.5–15.5)
WBC Count: 5.5 10*3/uL (ref 4.0–10.5)
nRBC: 0 % (ref 0.0–0.2)

## 2021-07-20 LAB — LACTATE DEHYDROGENASE: LDH: 237 U/L — ABNORMAL HIGH (ref 98–192)

## 2021-07-20 MED ORDER — DEXAMETHASONE 4 MG PO TABS
10.0000 mg | ORAL_TABLET | Freq: Once | ORAL | Status: AC
  Administered 2021-07-20: 10 mg via ORAL

## 2021-07-20 MED ORDER — SODIUM CHLORIDE 0.9 % IV SOLN
300.0000 mg/m2 | Freq: Once | INTRAVENOUS | Status: AC
  Administered 2021-07-20: 480 mg via INTRAVENOUS
  Filled 2021-07-20: qty 24

## 2021-07-20 MED ORDER — SODIUM CHLORIDE 0.9 % IV SOLN
Freq: Once | INTRAVENOUS | Status: AC
  Filled 2021-07-20: qty 250

## 2021-07-20 MED ORDER — DEXAMETHASONE 4 MG PO TABS
ORAL_TABLET | ORAL | Status: AC
  Filled 2021-07-20: qty 3

## 2021-07-24 ENCOUNTER — Encounter: Payer: Self-pay | Admitting: Hematology and Oncology
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
## 2021-07-25 ENCOUNTER — Telehealth: Payer: Self-pay | Admitting: Hematology and Oncology

## 2021-07-25 NOTE — Telephone Encounter (Signed)
Scheduled appts per 8/1 sch msg. Pt aware.  

## 2021-07-26 ENCOUNTER — Other Ambulatory Visit: Payer: Self-pay | Admitting: Family Medicine

## 2021-07-27 ENCOUNTER — Inpatient Hospital Stay: Payer: Medicare Other | Attending: Hematology and Oncology

## 2021-07-27 ENCOUNTER — Other Ambulatory Visit: Payer: Self-pay

## 2021-07-27 ENCOUNTER — Inpatient Hospital Stay: Payer: Medicare Other

## 2021-07-27 VITALS — BP 149/74 | HR 83 | Temp 98.8°F | Resp 18

## 2021-07-27 DIAGNOSIS — E8581 Light chain (AL) amyloidosis: Secondary | ICD-10-CM | POA: Diagnosis present

## 2021-07-27 DIAGNOSIS — Z5111 Encounter for antineoplastic chemotherapy: Secondary | ICD-10-CM | POA: Diagnosis present

## 2021-07-27 DIAGNOSIS — Z79899 Other long term (current) drug therapy: Secondary | ICD-10-CM | POA: Insufficient documentation

## 2021-07-27 DIAGNOSIS — Z5112 Encounter for antineoplastic immunotherapy: Secondary | ICD-10-CM | POA: Diagnosis present

## 2021-07-27 LAB — CBC WITH DIFFERENTIAL (CANCER CENTER ONLY)
Abs Immature Granulocytes: 0.03 10*3/uL (ref 0.00–0.07)
Basophils Absolute: 0 10*3/uL (ref 0.0–0.1)
Basophils Relative: 1 %
Eosinophils Absolute: 0.1 10*3/uL (ref 0.0–0.5)
Eosinophils Relative: 1 %
HCT: 31.2 % — ABNORMAL LOW (ref 36.0–46.0)
Hemoglobin: 11.1 g/dL — ABNORMAL LOW (ref 12.0–15.0)
Immature Granulocytes: 1 %
Lymphocytes Relative: 6 %
Lymphs Abs: 0.3 10*3/uL — ABNORMAL LOW (ref 0.7–4.0)
MCH: 35.1 pg — ABNORMAL HIGH (ref 26.0–34.0)
MCHC: 35.6 g/dL (ref 30.0–36.0)
MCV: 98.7 fL (ref 80.0–100.0)
Monocytes Absolute: 0.6 10*3/uL (ref 0.1–1.0)
Monocytes Relative: 10 %
Neutro Abs: 4.5 10*3/uL (ref 1.7–7.7)
Neutrophils Relative %: 81 %
Platelet Count: 339 10*3/uL (ref 150–400)
RBC: 3.16 MIL/uL — ABNORMAL LOW (ref 3.87–5.11)
RDW: 14 % (ref 11.5–15.5)
WBC Count: 5.5 10*3/uL (ref 4.0–10.5)
nRBC: 0 % (ref 0.0–0.2)

## 2021-07-27 LAB — CMP (CANCER CENTER ONLY)
ALT: 8 U/L (ref 0–44)
AST: 15 U/L (ref 15–41)
Albumin: 3.4 g/dL — ABNORMAL LOW (ref 3.5–5.0)
Alkaline Phosphatase: 82 U/L (ref 38–126)
Anion gap: 11 (ref 5–15)
BUN: 15 mg/dL (ref 8–23)
CO2: 25 mmol/L (ref 22–32)
Calcium: 8.7 mg/dL — ABNORMAL LOW (ref 8.9–10.3)
Chloride: 96 mmol/L — ABNORMAL LOW (ref 98–111)
Creatinine: 0.81 mg/dL (ref 0.44–1.00)
GFR, Estimated: >60 mL/min (ref >60)
Glucose, Bld: 96 mg/dL (ref 70–99)
Potassium: 3.9 mmol/L (ref 3.5–5.1)
Sodium: 132 mmol/L — ABNORMAL LOW (ref 135–145)
Total Bilirubin: 0.6 mg/dL (ref 0.3–1.2)
Total Protein: 5.5 g/dL — ABNORMAL LOW (ref 6.5–8.1)

## 2021-07-27 LAB — LACTATE DEHYDROGENASE: LDH: 228 U/L — ABNORMAL HIGH (ref 98–192)

## 2021-07-27 MED ORDER — DARATUMUMAB-HYALURONIDASE-FIHJ 1800-30000 MG-UT/15ML ~~LOC~~ SOLN
1800.0000 mg | Freq: Once | SUBCUTANEOUS | Status: AC
  Administered 2021-07-27: 1800 mg via SUBCUTANEOUS
  Filled 2021-07-27: qty 15

## 2021-07-27 MED ORDER — SODIUM CHLORIDE 0.9 % IV SOLN
Freq: Once | INTRAVENOUS | Status: AC
  Filled 2021-07-27: qty 250

## 2021-07-27 MED ORDER — ACETAMINOPHEN 325 MG PO TABS
ORAL_TABLET | ORAL | Status: AC
  Filled 2021-07-27: qty 2

## 2021-07-27 MED ORDER — DEXAMETHASONE 4 MG PO TABS
10.0000 mg | ORAL_TABLET | Freq: Once | ORAL | Status: AC
  Administered 2021-07-27: 10 mg via ORAL

## 2021-07-27 MED ORDER — SODIUM CHLORIDE 0.9 % IV SOLN
300.0000 mg/m2 | Freq: Once | INTRAVENOUS | Status: AC
  Administered 2021-07-27: 480 mg via INTRAVENOUS
  Filled 2021-07-27: qty 24

## 2021-07-27 MED ORDER — ACETAMINOPHEN 325 MG PO TABS: 650.0000 mg | ORAL_TABLET | Freq: Once | ORAL | Status: DC

## 2021-07-27 MED ORDER — DEXAMETHASONE 4 MG PO TABS
ORAL_TABLET | ORAL | Status: AC
  Filled 2021-07-27: qty 3

## 2021-07-27 NOTE — Telephone Encounter (Signed)
Name of Medication: Xanax Name of Pharmacy: [facility] or Written Date and Quantity: 04/04/21 #30 tabs with 1 refill Last Office Visit and Type: hospital f/u on 07/18/21  Next Office Visit and Type: CPE on 10/27/21

## 2021-07-27 NOTE — Patient Instructions (Addendum)
[facility] [place]   Discharge Instructions: Thank you for choosing [facility] to provide your oncology and hematology care.   If you have a lab appointment with the [facility], please go directly to the [facility] and check in at the registration area.   Wear comfortable clothing and clothing appropriate for easy access to any Portacath or PICC line.   We strive to give you quality time with your provider. You may need to reschedule your appointment if you arrive late (15 or more minutes).  Arriving late affects you and other patients whose appointments are after yours.  Also, if you miss three or more appointments without notifying the office, you may be dismissed from the clinic at the provider's discretion.      For prescription refill requests, have your pharmacy contact our office and allow 72 hours for refills to be completed.    Today you received the following chemotherapy and/or immunotherapy agents: cyclophosphamide and daratumumab/hyaluronidase.      To help prevent nausea and vomiting after your treatment, we encourage you to take your nausea medication as directed.  BELOW ARE SYMPTOMS THAT SHOULD BE REPORTED IMMEDIATELY: *FEVER GREATER THAN 100.4 F (38 C) OR HIGHER *CHILLS OR SWEATING *NAUSEA AND VOMITING THAT IS NOT CONTROLLED WITH YOUR NAUSEA MEDICATION *UNUSUAL SHORTNESS OF BREATH *UNUSUAL BRUISING OR BLEEDING *URINARY PROBLEMS (pain or burning when urinating, or frequent urination) *BOWEL PROBLEMS (unusual diarrhea, constipation, pain near the anus) TENDERNESS IN MOUTH AND THROAT WITH OR WITHOUT PRESENCE OF ULCERS (sore throat, sores in mouth, or a toothache) UNUSUAL RASH, SWELLING OR PAIN  UNUSUAL VAGINAL DISCHARGE OR ITCHING   Items with * indicate a potential emergency and should be followed up as soon as possible or go to the Emergency Department if any problems should occur.  Please show the CHEMOTHERAPY ALERT CARD or  IMMUNOTHERAPY ALERT CARD at check-in to the Emergency Department and triage nurse.  Should you have questions after your visit or need to cancel or reschedule your appointment, please contact [facility]  Dept: [phone]  and follow the prompts.  Office hours are 8:00 a.m. to 4:30 p.m. Monday - Friday. Please note that voicemails left after 4:00 p.m. may not be returned until the following business day.  We are closed weekends and major holidays. You have access to a nurse at all times for urgent questions. Please call the main number to the clinic Dept: [phone] and follow the prompts.   For any non-urgent questions, you may also contact your provider using MyChart. We now offer e-Visits for anyone 29 and older to request care online for non-urgent symptoms. For details visit [email or web address].   Also download the MyChart app! Go to the app store, search "MyChart", open the app, select [facility], and log in with your MyChart username and password.  Due to Covid, a mask is required upon entering the hospital/clinic. If you do not have a mask, one will be given to you upon arrival. For doctor visits, patients may have 1 support person aged 67 or older with them. For treatment visits, patients cannot have anyone with them due to current Covid guidelines and our immunocompromised population.  [facility] [place]  Discharge Instructions: Thank you for choosing [facility] to provide your oncology and hematology care.   If you have a lab appointment with the [facility], please go directly to the [facility] and  check in at the registration area.  Wear comfortable clothing and clothing appropriate for easy access to any Portacath or PICC line.   We strive to give you quality time with your provider. You may need to reschedule your appointment if you arrive late (15 or more minutes).  Arriving late affects you and other  patients whose appointments are after yours.  Also, if you miss three or more appointments without notifying the office, you may be dismissed from the clinic at the provider's discretion.      For prescription refill requests, have your pharmacy contact our office and allow 72 hours for refills to be completed.    Today you received the following chemotherapy and/or immunotherapy agents darzalex, cytoxan    To help prevent nausea and vomiting after your treatment, we encourage you to take your nausea medication as directed.  BELOW ARE SYMPTOMS THAT SHOULD BE REPORTED IMMEDIATELY: *FEVER GREATER THAN 100.4 F (38 C) OR HIGHER *CHILLS OR SWEATING *NAUSEA AND VOMITING THAT IS NOT CONTROLLED WITH YOUR NAUSEA MEDICATION *UNUSUAL SHORTNESS OF BREATH *UNUSUAL BRUISING OR BLEEDING *URINARY PROBLEMS (pain or burning when urinating, or frequent urination) *BOWEL PROBLEMS (unusual diarrhea, constipation, pain near the anus) TENDERNESS IN MOUTH AND THROAT WITH OR WITHOUT PRESENCE OF ULCERS (sore throat, sores in mouth, or a toothache) UNUSUAL RASH, SWELLING OR PAIN  UNUSUAL VAGINAL DISCHARGE OR ITCHING   Items with * indicate a potential emergency and should be followed up as soon as possible or go to the Emergency Department if any problems should occur.  Please show the CHEMOTHERAPY ALERT CARD or IMMUNOTHERAPY ALERT CARD at check-in to the Emergency Department and triage nurse. Should you have questions after your visit or need to cancel or reschedule your appointment, please contact [facility]  [phone] and follow the prompts.  Office hours are 8:00 a.m. to 4:30 p.m. Monday - Friday. Please note that voicemails left after 4:00 p.m. may not be returned until the following business day.  We are closed weekends and major holidays. You have access to a nurse at all times for urgent questions. Please call the main number to the clinic [phone] and follow the  prompts.  For any non-urgent questions, you may also contact your provider using MyChart. We now offer e-Visits for anyone 28 and older to request care online for non-urgent symptoms. For details visit [email or web address].   Also download the MyChart app! Go to the app store, search "MyChart", open the app, select [facility], and log in with your MyChart username and password.  Due to Covid, a mask is required upon entering the hospital/clinic. If you do not have a mask, one will be given to you upon arrival. For doctor visits, patients may have 1 support person aged 71 or older with them. For treatment visits, patients cannot have anyone with them due to current Covid guidelines and our immunocompromised population.

## 2021-07-28 LAB — KAPPA/LAMBDA LIGHT CHAINS
Kappa free light chain: 7.7 mg/L (ref 3.3–19.4)
Kappa, lambda light chain ratio: 0.24 — ABNORMAL LOW (ref 0.26–1.65)
Lambda free light chains: 32.1 mg/L — ABNORMAL HIGH (ref 5.7–26.3)

## 2021-07-31 LAB — MULTIPLE MYELOMA PANEL, SERUM
Albumin SerPl Elph-Mcnc: 2.8 g/dL — ABNORMAL LOW (ref 2.9–4.4)
Albumin/Glob SerPl: 1.5 (ref 0.7–1.7)
Alpha 1: 0.3 g/dL (ref 0.0–0.4)
Alpha2 Glob SerPl Elph-Mcnc: 0.7 g/dL (ref 0.4–1.0)
B-Globulin SerPl Elph-Mcnc: 0.8 g/dL (ref 0.7–1.3)
Gamma Glob SerPl Elph-Mcnc: 0.2 g/dL — ABNORMAL LOW (ref 0.4–1.8)
Globulin, Total: 2 g/dL — ABNORMAL LOW (ref 2.2–3.9)
IgA: 40 mg/dL — ABNORMAL LOW (ref 64–422)
IgG (Immunoglobin G), Serum: 255 mg/dL — ABNORMAL LOW (ref 586–1602)
IgM (Immunoglobulin M), Srm: 26 mg/dL (ref 26–217)
M Protein SerPl Elph-Mcnc: 0.1 g/dL — ABNORMAL HIGH
Total Protein ELP: 4.8 g/dL — ABNORMAL LOW (ref 6.0–8.5)

## 2021-08-03 ENCOUNTER — Other Ambulatory Visit: Payer: Medicare Other

## 2021-08-03 ENCOUNTER — Ambulatory Visit: Payer: Medicare Other | Admitting: Hematology and Oncology

## 2021-08-04 ENCOUNTER — Inpatient Hospital Stay (HOSPITAL_BASED_OUTPATIENT_CLINIC_OR_DEPARTMENT_OTHER): Payer: Medicare Other | Admitting: Hematology and Oncology

## 2021-08-04 ENCOUNTER — Inpatient Hospital Stay: Payer: Medicare Other

## 2021-08-04 ENCOUNTER — Other Ambulatory Visit: Payer: Self-pay

## 2021-08-04 ENCOUNTER — Encounter: Payer: Self-pay | Admitting: Hematology and Oncology

## 2021-08-04 VITALS — BP 144/77 | HR 79 | Temp 97.9°F | Resp 16 | Ht 62.0 in | Wt 116.3 lb

## 2021-08-04 DIAGNOSIS — E8581 Light chain (AL) amyloidosis: Secondary | ICD-10-CM

## 2021-08-04 DIAGNOSIS — R803 Bence Jones proteinuria: Secondary | ICD-10-CM | POA: Diagnosis not present

## 2021-08-04 DIAGNOSIS — Z5111 Encounter for antineoplastic chemotherapy: Secondary | ICD-10-CM | POA: Diagnosis not present

## 2021-08-04 LAB — CMP (CANCER CENTER ONLY)
ALT: 12 U/L (ref 0–44)
AST: 16 U/L (ref 15–41)
Albumin: 2.8 g/dL — ABNORMAL LOW (ref 3.5–5.0)
Alkaline Phosphatase: 74 U/L (ref 38–126)
Anion gap: 9 (ref 5–15)
BUN: 16 mg/dL (ref 8–23)
CO2: 26 mmol/L (ref 22–32)
Calcium: 8.9 mg/dL (ref 8.9–10.3)
Chloride: 98 mmol/L (ref 98–111)
Creatinine: 0.83 mg/dL (ref 0.44–1.00)
GFR, Estimated: >60 mL/min (ref >60)
Glucose, Bld: 118 mg/dL — ABNORMAL HIGH (ref 70–99)
Potassium: 3.9 mmol/L (ref 3.5–5.1)
Sodium: 133 mmol/L — ABNORMAL LOW (ref 135–145)
Total Bilirubin: 0.6 mg/dL (ref 0.3–1.2)
Total Protein: 5.3 g/dL — ABNORMAL LOW (ref 6.5–8.1)

## 2021-08-04 LAB — CBC WITH DIFFERENTIAL (CANCER CENTER ONLY)
Abs Immature Granulocytes: 0.03 10*3/uL (ref 0.00–0.07)
Basophils Absolute: 0 10*3/uL (ref 0.0–0.1)
Basophils Relative: 1 %
Eosinophils Absolute: 0 10*3/uL (ref 0.0–0.5)
Eosinophils Relative: 1 %
HCT: 30.6 % — ABNORMAL LOW (ref 36.0–46.0)
Hemoglobin: 10.5 g/dL — ABNORMAL LOW (ref 12.0–15.0)
Immature Granulocytes: 1 %
Lymphocytes Relative: 5 %
Lymphs Abs: 0.2 10*3/uL — ABNORMAL LOW (ref 0.7–4.0)
MCH: 34.3 pg — ABNORMAL HIGH (ref 26.0–34.0)
MCHC: 34.3 g/dL (ref 30.0–36.0)
MCV: 100 fL (ref 80.0–100.0)
Monocytes Absolute: 0.5 10*3/uL (ref 0.1–1.0)
Monocytes Relative: 11 %
Neutro Abs: 3.6 10*3/uL (ref 1.7–7.7)
Neutrophils Relative %: 81 %
Platelet Count: 295 10*3/uL (ref 150–400)
RBC: 3.06 MIL/uL — ABNORMAL LOW (ref 3.87–5.11)
RDW: 14.1 % (ref 11.5–15.5)
WBC Count: 4.4 10*3/uL (ref 4.0–10.5)
nRBC: 0 % (ref 0.0–0.2)

## 2021-08-04 LAB — LACTATE DEHYDROGENASE: LDH: 232 U/L — ABNORMAL HIGH (ref 98–192)

## 2021-08-04 MED ORDER — SODIUM CHLORIDE 0.9 % IV SOLN
Freq: Once | INTRAVENOUS | Status: AC
  Filled 2021-08-04: qty 250

## 2021-08-04 MED ORDER — SODIUM CHLORIDE 0.9 % IV SOLN
300.0000 mg/m2 | Freq: Once | INTRAVENOUS | Status: AC
  Administered 2021-08-04: 480 mg via INTRAVENOUS
  Filled 2021-08-04: qty 24

## 2021-08-04 MED ORDER — DEXAMETHASONE 4 MG PO TABS
10.0000 mg | ORAL_TABLET | Freq: Once | ORAL | Status: AC
  Administered 2021-08-04: 10 mg via ORAL
  Filled 2021-08-04: qty 3
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
## 2021-08-08 ENCOUNTER — Encounter: Payer: Self-pay | Admitting: Hematology and Oncology

## 2021-08-08 ENCOUNTER — Telehealth: Payer: Self-pay

## 2021-08-08 LAB — UPEP/UIFE/LIGHT CHAINS/TP, 24-HR UR
% BETA, Urine: 8 %
ALPHA 1 URINE: 6.5 %
Albumin, U: 78.4 %
Alpha 2, Urine: 6.4 %
Free Kappa Lt Chains,Ur: 4.07 mg/L (ref 1.17–86.46)
Free Kappa/Lambda Ratio: 0.6 — ABNORMAL LOW (ref 1.83–14.26)
Free Lambda Lt Chains,Ur: 6.75 mg/L (ref 0.27–15.21)
GAMMA GLOBULIN URINE: 0.7 %
Total Protein, Urine-Ur/day: 756 mg/24 hr — ABNORMAL HIGH (ref 30–150)
Total Protein, Urine: 88.9 mg/dL
Total Volume: 850

## 2021-08-08 MED ORDER — DIPHENOXYLATE-ATROPINE 2.5-0.025 MG PO TABS: 1.0000 | ORAL_TABLET | Freq: Four times a day (QID) | ORAL | 0 refills | Status: AC | PRN

## 2021-08-08 NOTE — Telephone Encounter (Signed)
Notified Patient of Prior Authorization approval for Diphenoxylate-Atropine 2.5-0.025 mg tablets. Medication is approved through 12/23/2021

## 2021-08-09 ENCOUNTER — Telehealth: Payer: Self-pay

## 2021-08-09 NOTE — Telephone Encounter (Signed)
Pt LM stating she has questions regarding her 08/10/21 tx appt. She states it is her understanding the appt is only to be for and injection and would like to discuss this further with Joesphine Bare, RN.  I advised the pt Beth, RN would give her a call back once she returns to the office. Pt expressed understanding of this and thanked me for acknowledging her call.

## 2021-08-10 ENCOUNTER — Inpatient Hospital Stay: Payer: Medicare Other

## 2021-08-10 ENCOUNTER — Other Ambulatory Visit: Payer: Self-pay

## 2021-08-10 ENCOUNTER — Other Ambulatory Visit: Payer: Self-pay | Admitting: Physician Assistant

## 2021-08-10 ENCOUNTER — Telehealth: Payer: Self-pay | Admitting: *Deleted

## 2021-08-10 VITALS — BP 156/76 | HR 78 | Temp 98.4°F | Resp 18

## 2021-08-10 DIAGNOSIS — E8581 Light chain (AL) amyloidosis: Secondary | ICD-10-CM

## 2021-08-10 DIAGNOSIS — Z5111 Encounter for antineoplastic chemotherapy: Secondary | ICD-10-CM | POA: Diagnosis not present

## 2021-08-10 LAB — CBC WITH DIFFERENTIAL (CANCER CENTER ONLY)
Abs Immature Granulocytes: 0.01 10*3/uL (ref 0.00–0.07)
Basophils Absolute: 0 10*3/uL (ref 0.0–0.1)
Basophils Relative: 1 %
Eosinophils Absolute: 0 10*3/uL (ref 0.0–0.5)
Eosinophils Relative: 1 %
HCT: 29.9 % — ABNORMAL LOW (ref 36.0–46.0)
Hemoglobin: 10.9 g/dL — ABNORMAL LOW (ref 12.0–15.0)
Immature Granulocytes: 0 %
Lymphocytes Relative: 5 %
Lymphs Abs: 0.2 10*3/uL — ABNORMAL LOW (ref 0.7–4.0)
MCH: 35.5 pg — ABNORMAL HIGH (ref 26.0–34.0)
MCHC: 36.5 g/dL — ABNORMAL HIGH (ref 30.0–36.0)
MCV: 97.4 fL (ref 80.0–100.0)
Monocytes Absolute: 0.4 10*3/uL (ref 0.1–1.0)
Monocytes Relative: 9 %
Neutro Abs: 3.9 10*3/uL (ref 1.7–7.7)
Neutrophils Relative %: 84 %
Platelet Count: 271 10*3/uL (ref 150–400)
RBC: 3.07 MIL/uL — ABNORMAL LOW (ref 3.87–5.11)
RDW: 14.1 % (ref 11.5–15.5)
WBC Count: 4.6 10*3/uL (ref 4.0–10.5)
nRBC: 0 % (ref 0.0–0.2)

## 2021-08-10 LAB — CMP (CANCER CENTER ONLY)
ALT: 9 U/L (ref 0–44)
AST: 15 U/L (ref 15–41)
Albumin: 2.9 g/dL — ABNORMAL LOW (ref 3.5–5.0)
Alkaline Phosphatase: 85 U/L (ref 38–126)
Anion gap: 9 (ref 5–15)
BUN: 15 mg/dL (ref 8–23)
CO2: 25 mmol/L (ref 22–32)
Calcium: 8.6 mg/dL — ABNORMAL LOW (ref 8.9–10.3)
Chloride: 99 mmol/L (ref 98–111)
Creatinine: 0.82 mg/dL (ref 0.44–1.00)
GFR, Estimated: >60 mL/min (ref >60)
Glucose, Bld: 91 mg/dL (ref 70–99)
Potassium: 3.6 mmol/L (ref 3.5–5.1)
Sodium: 133 mmol/L — ABNORMAL LOW (ref 135–145)
Total Bilirubin: 0.6 mg/dL (ref 0.3–1.2)
Total Protein: 5.2 g/dL — ABNORMAL LOW (ref 6.5–8.1)

## 2021-08-10 LAB — LACTATE DEHYDROGENASE: LDH: 246 U/L — ABNORMAL HIGH (ref 98–192)

## 2021-08-10 MED ORDER — ACETAMINOPHEN 325 MG PO TABS
650.0000 mg | ORAL_TABLET | Freq: Once | ORAL | Status: AC
  Administered 2021-08-10: 650 mg via ORAL

## 2021-08-10 MED ORDER — DEXAMETHASONE 4 MG PO TABS
ORAL_TABLET | ORAL | Status: AC
  Filled 2021-08-10: qty 3

## 2021-08-10 MED ORDER — ACETAMINOPHEN 325 MG PO TABS
ORAL_TABLET | ORAL | Status: AC
  Filled 2021-08-10: qty 2

## 2021-08-10 MED ORDER — DARATUMUMAB-HYALURONIDASE-FIHJ 1800-30000 MG-UT/15ML ~~LOC~~ SOLN
1800.0000 mg | Freq: Once | SUBCUTANEOUS | Status: AC
  Administered 2021-08-10: 1800 mg via SUBCUTANEOUS
  Filled 2021-08-10: qty 15

## 2021-08-10 MED ORDER — DEXAMETHASONE 4 MG PO TABS
10.0000 mg | ORAL_TABLET | Freq: Once | ORAL | Status: AC
  Administered 2021-08-10: 10 mg via ORAL

## 2021-08-10 NOTE — Telephone Encounter (Signed)
Received call from pt asking for clarification of her upcoming appts.  Advised that today she will just get labs and Dara-faspro. And this will be monthly now. It is her maintenance for the next 2 years. Pt voiced understanding. She will be here today as scheduled. Changed lab appt to lab only, not port flush as she does not have a port.

## 2021-08-10 NOTE — Patient Instructions (Signed)
[facility]  Discharge Instructions: Thank you for choosing [facility] to provide your oncology and hematology care.   If you have a lab appointment with the Cancer Center, please go directly to the Cancer Center and check in at the registration area.   Wear comfortable clothing and clothing appropriate for easy access to any Portacath or PICC line.   We strive to give you quality time with your provider. You may need to reschedule your appointment if you arrive late (15 or more minutes).  Arriving late affects you and other patients whose appointments are after yours.  Also, if you miss three or more appointments without notifying the office, you may be dismissed from the clinic at the provider's discretion.      For prescription refill requests, have your pharmacy contact our office and allow 72 hours for refills to be completed.    Today you received the following chemotherapy and/or immunotherapy agents darzalex faspro      To help prevent nausea and vomiting after your treatment, we encourage you to take your nausea medication as directed.  BELOW ARE SYMPTOMS THAT SHOULD BE REPORTED IMMEDIATELY: *FEVER GREATER THAN 100.4 F (38 C) OR HIGHER *CHILLS OR SWEATING *NAUSEA AND VOMITING THAT IS NOT CONTROLLED WITH YOUR NAUSEA MEDICATION *UNUSUAL SHORTNESS OF BREATH *UNUSUAL BRUISING OR BLEEDING *URINARY PROBLEMS (pain or burning when urinating, or frequent urination) *BOWEL PROBLEMS (unusual diarrhea, constipation, pain near the anus) TENDERNESS IN MOUTH AND THROAT WITH OR WITHOUT PRESENCE OF ULCERS (sore throat, sores in mouth, or a toothache) UNUSUAL RASH, SWELLING OR PAIN  UNUSUAL VAGINAL DISCHARGE OR ITCHING   Items with * indicate a potential emergency and should be followed up as soon as possible or go to the Emergency Department if any problems should occur.  Please show the CHEMOTHERAPY ALERT CARD or IMMUNOTHERAPY ALERT CARD at  check-in to the Emergency Department and triage nurse.  Should you have questions after your visit or need to cancel or reschedule your appointment, please contact [facility]  Dept: [phone]  and follow the prompts.  Office hours are 8:00 a.m. to 4:30 p.m. Monday - Friday. Please note that voicemails left after 4:00 p.m. may not be returned until the following business day.  We are closed weekends and major holidays. You have access to a nurse at all times for urgent questions. Please call the main number to the clinic Dept: [phone] and follow the prompts.   For any non-urgent questions, you may also contact your provider using MyChart. We now offer e-Visits for anyone 18 and older to request care online for non-urgent symptoms. For details visit [email or web address].   Also download the MyChart app! Go to the app store, search "MyChart", open the app, select [facility], and log in with your MyChart username and password.  Due to Covid, a mask is required upon entering the hospital/clinic. If you do not have a mask, one will be given to you upon arrival. For doctor visits, patients may have 1 support person aged 18 or older with them. For treatment visits, patients cannot have anyone with them due to current Covid guidelines and our immunocompromised population.   

## 2021-08-14 ENCOUNTER — Telehealth: Payer: Self-pay | Admitting: *Deleted

## 2021-08-14 NOTE — Telephone Encounter (Signed)
-----   Message from Orson Slick, MD sent at 08/14/2021  9:28 AM EDT ----- Please let Mrs. Lawler know that her urine protein is back in the 700 mg /day range (it briefly bumped to '1200mg'$ /day). Overall her kidney function is stable, improved from from her last visit.   ----- Message ----- From: Buel Ream, [facility] Sent: 08/04/2021  10:00 AM EDT To: Orson Slick, MD

## 2021-08-14 NOTE — Telephone Encounter (Signed)
TCT patient regarding recent labs. Spoke with her and informed her that her urine protein levels are  in a better range this last time. Provided results to her. Also advised that her kidney function is stable, improved from last check. Pt very pleased with this. She states she is tired but this news perked her up.  She is aware of her appts in September. She sees her nephrologist on 08/16/21.

## 2021-08-21 ENCOUNTER — Other Ambulatory Visit: Payer: Self-pay

## 2021-08-21 ENCOUNTER — Ambulatory Visit (INDEPENDENT_AMBULATORY_CARE_PROVIDER_SITE_OTHER): Payer: Medicare Other | Admitting: Cardiovascular Disease

## 2021-08-21 ENCOUNTER — Encounter: Payer: Self-pay | Admitting: Cardiovascular Disease

## 2021-08-21 VITALS — BP 130/88 | HR 81 | Ht 62.0 in | Wt 114.0 lb

## 2021-08-21 DIAGNOSIS — I2510 Atherosclerotic heart disease of native coronary artery without angina pectoris: Secondary | ICD-10-CM | POA: Diagnosis not present

## 2021-08-21 MED ORDER — ROSUVASTATIN CALCIUM 10 MG PO TABS: 10.0000 mg | ORAL_TABLET | Freq: Every day | ORAL | 3 refills | Status: DC
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
## 2021-08-21 NOTE — Patient Instructions (Signed)

## 2021-09-07 ENCOUNTER — Other Ambulatory Visit: Payer: Medicare Other

## 2021-09-07 ENCOUNTER — Encounter: Payer: Self-pay | Admitting: Hematology and Oncology

## 2021-09-07 ENCOUNTER — Inpatient Hospital Stay: Payer: Medicare Other

## 2021-09-07 ENCOUNTER — Other Ambulatory Visit: Payer: Self-pay

## 2021-09-07 ENCOUNTER — Inpatient Hospital Stay: Payer: Medicare Other | Attending: Hematology and Oncology | Admitting: Hematology and Oncology

## 2021-09-07 VITALS — BP 173/85 | HR 73

## 2021-09-07 VITALS — BP 184/92 | HR 76 | Temp 97.8°F | Resp 18 | Ht 62.0 in | Wt 114.6 lb

## 2021-09-07 DIAGNOSIS — R803 Bence Jones proteinuria: Secondary | ICD-10-CM

## 2021-09-07 DIAGNOSIS — E8581 Light chain (AL) amyloidosis: Secondary | ICD-10-CM

## 2021-09-07 DIAGNOSIS — Z5112 Encounter for antineoplastic immunotherapy: Secondary | ICD-10-CM | POA: Diagnosis not present

## 2021-09-07 DIAGNOSIS — Z79899 Other long term (current) drug therapy: Secondary | ICD-10-CM | POA: Diagnosis not present

## 2021-09-07 LAB — CMP (CANCER CENTER ONLY)
ALT: 13 U/L (ref 0–44)
AST: 21 U/L (ref 15–41)
Albumin: 3.4 g/dL — ABNORMAL LOW (ref 3.5–5.0)
Alkaline Phosphatase: 104 U/L (ref 38–126)
Anion gap: 11 (ref 5–15)
BUN: 17 mg/dL (ref 8–23)
CO2: 27 mmol/L (ref 22–32)
Calcium: 9.5 mg/dL (ref 8.9–10.3)
Chloride: 98 mmol/L (ref 98–111)
Creatinine: 0.9 mg/dL (ref 0.44–1.00)
GFR, Estimated: >60 mL/min
Glucose, Bld: 81 mg/dL (ref 70–99)
Potassium: 3.8 mmol/L (ref 3.5–5.1)
Sodium: 136 mmol/L (ref 135–145)
Total Bilirubin: 0.6 mg/dL (ref 0.3–1.2)
Total Protein: 6.4 g/dL — ABNORMAL LOW (ref 6.5–8.1)

## 2021-09-07 LAB — CBC WITH DIFFERENTIAL (CANCER CENTER ONLY)
Abs Immature Granulocytes: 0.02 10*3/uL (ref 0.00–0.07)
Basophils Absolute: 0.1 10*3/uL (ref 0.0–0.1)
Basophils Relative: 1 %
Eosinophils Absolute: 0.1 10*3/uL (ref 0.0–0.5)
Eosinophils Relative: 1 %
HCT: 36.7 % (ref 36.0–46.0)
Hemoglobin: 12.3 g/dL (ref 12.0–15.0)
Immature Granulocytes: 0 %
Lymphocytes Relative: 7 %
Lymphs Abs: 0.5 10*3/uL — ABNORMAL LOW (ref 0.7–4.0)
MCH: 33.8 pg (ref 26.0–34.0)
MCHC: 33.5 g/dL (ref 30.0–36.0)
MCV: 100.8 fL — ABNORMAL HIGH (ref 80.0–100.0)
Monocytes Absolute: 0.7 10*3/uL (ref 0.1–1.0)
Monocytes Relative: 10 %
Neutro Abs: 5.9 10*3/uL (ref 1.7–7.7)
Neutrophils Relative %: 81 %
Platelet Count: 332 10*3/uL (ref 150–400)
RBC: 3.64 MIL/uL — ABNORMAL LOW (ref 3.87–5.11)
RDW: 14 % (ref 11.5–15.5)
WBC Count: 7.2 10*3/uL (ref 4.0–10.5)
nRBC: 0 % (ref 0.0–0.2)

## 2021-09-07 LAB — LACTATE DEHYDROGENASE: LDH: 289 U/L — ABNORMAL HIGH (ref 98–192)

## 2021-09-07 MED ORDER — DEXAMETHASONE 4 MG PO TABS
10.0000 mg | ORAL_TABLET | Freq: Once | ORAL | Status: AC
  Administered 2021-09-07: 10 mg via ORAL

## 2021-09-07 MED ORDER — ACETAMINOPHEN 325 MG PO TABS: 650.0000 mg | ORAL_TABLET | Freq: Once | ORAL | Status: DC

## 2021-09-07 MED ORDER — DEXAMETHASONE 4 MG PO TABS
ORAL_TABLET | ORAL | Status: AC
  Filled 2021-09-07: qty 3

## 2021-09-07 MED ORDER — DARATUMUMAB-HYALURONIDASE-FIHJ 1800-30000 MG-UT/15ML ~~LOC~~ SOLN
1800.0000 mg | Freq: Once | SUBCUTANEOUS | Status: AC
  Administered 2021-09-07: 1800 mg via SUBCUTANEOUS
  Filled 2021-09-07: qty 15
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
## 2021-09-07 NOTE — Patient Instructions (Signed)
[facility]  Discharge Instructions: Thank you for choosing [facility] to provide your oncology and hematology care.   If you have a lab appointment with the [facility], please go directly to the [facility] and check in at the registration area.   Wear comfortable clothing and clothing appropriate for easy access to any Portacath or PICC line.   We strive to give you quality time with your provider. You may need to reschedule your appointment if you arrive late (15 or more minutes).  Arriving late affects you and other patients whose appointments are after yours.  Also, if you miss three or more appointments without notifying the office, you may be dismissed from the clinic at the provider's discretion.      For prescription refill requests, have your pharmacy contact our office and allow 72 hours for refills to be completed.    Today you received the following chemotherapy and/or immunotherapy agent: Daratumumab (Darzalex Faspro)   To help prevent nausea and vomiting after your treatment, we encourage you to take your nausea medication as directed.  BELOW ARE SYMPTOMS THAT SHOULD BE REPORTED IMMEDIATELY: *FEVER GREATER THAN 100.4 F (38 C) OR HIGHER *CHILLS OR SWEATING *NAUSEA AND VOMITING THAT IS NOT CONTROLLED WITH YOUR NAUSEA MEDICATION *UNUSUAL SHORTNESS OF BREATH *UNUSUAL BRUISING OR BLEEDING *URINARY PROBLEMS (pain or burning when urinating, or frequent urination) *BOWEL PROBLEMS (unusual diarrhea, constipation, pain near the anus) TENDERNESS IN MOUTH AND THROAT WITH OR WITHOUT PRESENCE OF ULCERS (sore throat, sores in mouth, or a toothache) UNUSUAL RASH, SWELLING OR PAIN  UNUSUAL VAGINAL DISCHARGE OR ITCHING   Items with * indicate a potential emergency and should be followed up as soon as possible or go to the Emergency Department if any problems should occur.  Please show the CHEMOTHERAPY ALERT CARD or IMMUNOTHERAPY ALERT CARD  at check-in to the Emergency Department and triage nurse.  Should you have questions after your visit or need to cancel or reschedule your appointment, please contact [facility]  Dept: [phone]  and follow the prompts.  Office hours are 8:00 a.m. to 4:30 p.m. Monday - Friday. Please note that voicemails left after 4:00 p.m. may not be returned until the following business day.  We are closed weekends and major holidays. You have access to a nurse at all times for urgent questions. Please call the main number to the clinic Dept: [phone] and follow the prompts.   For any non-urgent questions, you may also contact your provider using MyChart. We now offer e-Visits for anyone 70 and older to request care online for non-urgent symptoms. For details visit [email or web address].   Also download the MyChart app! Go to the app store, search "MyChart", open the app, select [facility], and log in with your MyChart username and password.  Due to Covid, a mask is required upon entering the hospital/clinic. If you do not have a mask, one will be given to you upon arrival. For doctor visits, patients may have 1 support person aged 103 or older with them. For treatment visits, patients cannot have anyone with them due to current Covid guidelines and our immunocompromised population.

## 2021-09-08 LAB — KAPPA/LAMBDA LIGHT CHAINS
Kappa free light chain: 9.2 mg/L (ref 3.3–19.4)
Kappa, lambda light chain ratio: 0.23 — ABNORMAL LOW (ref 0.26–1.65)
Lambda free light chains: 39.9 mg/L — ABNORMAL HIGH (ref 5.7–26.3)

## 2021-09-11 ENCOUNTER — Encounter: Payer: Self-pay | Admitting: Hematology and Oncology

## 2021-09-11 LAB — MULTIPLE MYELOMA PANEL, SERUM
Albumin SerPl Elph-Mcnc: 3.4 g/dL (ref 2.9–4.4)
Albumin/Glob SerPl: 1.5 (ref 0.7–1.7)
Alpha 1: 0.3 g/dL (ref 0.0–0.4)
Alpha2 Glob SerPl Elph-Mcnc: 0.9 g/dL (ref 0.4–1.0)
B-Globulin SerPl Elph-Mcnc: 0.9 g/dL (ref 0.7–1.3)
Gamma Glob SerPl Elph-Mcnc: 0.3 g/dL — ABNORMAL LOW (ref 0.4–1.8)
Globulin, Total: 2.3 g/dL (ref 2.2–3.9)
IgA: 53 mg/dL — ABNORMAL LOW (ref 64–422)
IgG (Immunoglobin G), Serum: 276 mg/dL — ABNORMAL LOW (ref 586–1602)
IgM (Immunoglobulin M), Srm: 27 mg/dL (ref 26–217)
M Protein SerPl Elph-Mcnc: 0.1 g/dL — ABNORMAL HIGH
Total Protein ELP: 5.7 g/dL — ABNORMAL LOW (ref 6.0–8.5)

## 2021-09-14 ENCOUNTER — Telehealth: Payer: Self-pay | Admitting: Hematology and Oncology

## 2021-09-14 ENCOUNTER — Telehealth: Payer: Self-pay | Admitting: *Deleted

## 2021-09-14 NOTE — Telephone Encounter (Signed)
Scheduled appts per 9/19 sch msg. Pt is aware.

## 2021-09-14 NOTE — Telephone Encounter (Signed)
Received call from pt asking if it is ok for her to get a flu shot this year.  Discussed with Dr. Lorenso Courier and he stated she could get one. Informed the pt of he response. She will schedule with her PCP. No further questions orconcerns

## 2021-09-25 ENCOUNTER — Other Ambulatory Visit: Payer: Self-pay | Admitting: Hematology and Oncology

## 2021-09-27 ENCOUNTER — Inpatient Hospital Stay: Payer: Medicare Other | Attending: Hematology and Oncology

## 2021-09-27 ENCOUNTER — Other Ambulatory Visit: Payer: Self-pay

## 2021-09-27 DIAGNOSIS — E8581 Light chain (AL) amyloidosis: Secondary | ICD-10-CM

## 2021-09-29 LAB — UPEP/UIFE/LIGHT CHAINS/TP, 24-HR UR
% BETA, Urine: 12.4 %
ALPHA 1 URINE: 2.5 %
Albumin, U: 73.2 %
Alpha 2, Urine: 10.4 %
Free Kappa Lt Chains,Ur: 7.33 mg/L (ref 1.17–86.46)
Free Kappa/Lambda Ratio: 0.77 — ABNORMAL LOW (ref 1.83–14.26)
Free Lambda Lt Chains,Ur: 9.56 mg/L (ref 0.27–15.21)
GAMMA GLOBULIN URINE: 1.5 %
M-SPIKE %, Urine: 0.5 % — ABNORMAL HIGH
M-Spike, Mg/24 Hr: 4 mg/24 hr — ABNORMAL HIGH
Total Protein, Urine-Ur/day: 889 mg/24 hr — ABNORMAL HIGH (ref 30–150)
Total Protein, Urine: 93.6 mg/dL
Total Volume: 950

## 2021-10-05 ENCOUNTER — Inpatient Hospital Stay (HOSPITAL_BASED_OUTPATIENT_CLINIC_OR_DEPARTMENT_OTHER): Payer: Medicare Other | Admitting: Hematology and Oncology

## 2021-10-05 ENCOUNTER — Inpatient Hospital Stay: Payer: Medicare Other

## 2021-10-05 ENCOUNTER — Other Ambulatory Visit: Payer: Self-pay

## 2021-10-05 ENCOUNTER — Inpatient Hospital Stay: Payer: Medicare Other | Attending: Hematology and Oncology

## 2021-10-05 VITALS — BP 159/80 | HR 77 | Temp 98.1°F | Resp 17 | Wt 111.5 lb

## 2021-10-05 DIAGNOSIS — E8581 Light chain (AL) amyloidosis: Secondary | ICD-10-CM | POA: Insufficient documentation

## 2021-10-05 DIAGNOSIS — R803 Bence Jones proteinuria: Secondary | ICD-10-CM | POA: Diagnosis not present

## 2021-10-05 DIAGNOSIS — Z5111 Encounter for antineoplastic chemotherapy: Secondary | ICD-10-CM | POA: Diagnosis not present

## 2021-10-05 DIAGNOSIS — Z79899 Other long term (current) drug therapy: Secondary | ICD-10-CM | POA: Diagnosis not present

## 2021-10-05 DIAGNOSIS — Z5112 Encounter for antineoplastic immunotherapy: Secondary | ICD-10-CM | POA: Diagnosis present

## 2021-10-05 LAB — CMP (CANCER CENTER ONLY)
ALT: 14 U/L (ref 0–44)
AST: 22 U/L (ref 15–41)
Albumin: 3.5 g/dL (ref 3.5–5.0)
Alkaline Phosphatase: 109 U/L (ref 38–126)
Anion gap: 7 (ref 5–15)
BUN: 22 mg/dL (ref 8–23)
CO2: 29 mmol/L (ref 22–32)
Calcium: 8.8 mg/dL — ABNORMAL LOW (ref 8.9–10.3)
Chloride: 94 mmol/L — ABNORMAL LOW (ref 98–111)
Creatinine: 0.81 mg/dL (ref 0.44–1.00)
GFR, Estimated: >60 mL/min (ref >60)
Glucose, Bld: 94 mg/dL (ref 70–99)
Potassium: 3.2 mmol/L — ABNORMAL LOW (ref 3.5–5.1)
Sodium: 130 mmol/L — ABNORMAL LOW (ref 135–145)
Total Bilirubin: 0.7 mg/dL (ref 0.3–1.2)
Total Protein: 6.2 g/dL — ABNORMAL LOW (ref 6.5–8.1)

## 2021-10-05 LAB — CBC WITH DIFFERENTIAL (CANCER CENTER ONLY)
Abs Immature Granulocytes: 0.02 10*3/uL (ref 0.00–0.07)
Basophils Absolute: 0 10*3/uL (ref 0.0–0.1)
Basophils Relative: 1 %
Eosinophils Absolute: 0.1 10*3/uL (ref 0.0–0.5)
Eosinophils Relative: 1 %
HCT: 34.2 % — ABNORMAL LOW (ref 36.0–46.0)
Hemoglobin: 11.6 g/dL — ABNORMAL LOW (ref 12.0–15.0)
Immature Granulocytes: 0 %
Lymphocytes Relative: 4 %
Lymphs Abs: 0.2 10*3/uL — ABNORMAL LOW (ref 0.7–4.0)
MCH: 33.1 pg (ref 26.0–34.0)
MCHC: 33.9 g/dL (ref 30.0–36.0)
MCV: 97.7 fL (ref 80.0–100.0)
Monocytes Absolute: 0.4 10*3/uL (ref 0.1–1.0)
Monocytes Relative: 7 %
Neutro Abs: 5.1 10*3/uL (ref 1.7–7.7)
Neutrophils Relative %: 87 %
Platelet Count: 249 10*3/uL (ref 150–400)
RBC: 3.5 MIL/uL — ABNORMAL LOW (ref 3.87–5.11)
RDW: 13.7 % (ref 11.5–15.5)
WBC Count: 5.9 10*3/uL (ref 4.0–10.5)
nRBC: 0 % (ref 0.0–0.2)

## 2021-10-05 LAB — LACTATE DEHYDROGENASE: LDH: 218 U/L — ABNORMAL HIGH (ref 98–192)

## 2021-10-05 MED ORDER — DARATUMUMAB-HYALURONIDASE-FIHJ 1800-30000 MG-UT/15ML ~~LOC~~ SOLN
1800.0000 mg | Freq: Once | SUBCUTANEOUS | Status: AC
  Administered 2021-10-05: 1800 mg via SUBCUTANEOUS
  Filled 2021-10-05: qty 15

## 2021-10-05 MED ORDER — ACETAMINOPHEN 325 MG PO TABS
650.0000 mg | ORAL_TABLET | Freq: Once | ORAL | Status: AC
  Administered 2021-10-05: 650 mg via ORAL
  Filled 2021-10-05: qty 2

## 2021-10-05 MED ORDER — DEXAMETHASONE 4 MG PO TABS
10.0000 mg | ORAL_TABLET | Freq: Once | ORAL | Status: AC
  Administered 2021-10-05: 10 mg via ORAL
  Filled 2021-10-05: qty 3
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
## 2021-10-06 LAB — KAPPA/LAMBDA LIGHT CHAINS
Kappa free light chain: 8 mg/L (ref 3.3–19.4)
Kappa, lambda light chain ratio: 0.25 — ABNORMAL LOW (ref 0.26–1.65)
Lambda free light chains: 32.4 mg/L — ABNORMAL HIGH (ref 5.7–26.3)

## 2021-10-09 LAB — MULTIPLE MYELOMA PANEL, SERUM
Albumin SerPl Elph-Mcnc: 2.9 g/dL (ref 2.9–4.4)
Albumin/Glob SerPl: 1.4 (ref 0.7–1.7)
Alpha 1: 0.3 g/dL (ref 0.0–0.4)
Alpha2 Glob SerPl Elph-Mcnc: 0.9 g/dL (ref 0.4–1.0)
B-Globulin SerPl Elph-Mcnc: 0.8 g/dL (ref 0.7–1.3)
Gamma Glob SerPl Elph-Mcnc: 0.2 g/dL — ABNORMAL LOW (ref 0.4–1.8)
Globulin, Total: 2.2 g/dL (ref 2.2–3.9)
IgA: 46 mg/dL — ABNORMAL LOW (ref 64–422)
IgG (Immunoglobin G), Serum: 254 mg/dL — ABNORMAL LOW (ref 586–1602)
IgM (Immunoglobulin M), Srm: 24 mg/dL — ABNORMAL LOW (ref 26–217)
M Protein SerPl Elph-Mcnc: 0.1 g/dL — ABNORMAL HIGH
Total Protein ELP: 5.1 g/dL — ABNORMAL LOW (ref 6.0–8.5)

## 2021-10-12 ENCOUNTER — Telehealth: Payer: Self-pay

## 2021-10-12 DIAGNOSIS — M810 Age-related osteoporosis without current pathological fracture: Secondary | ICD-10-CM

## 2021-10-12 NOTE — Telephone Encounter (Signed)
Benefits submitted-pending  Next injection due after 11/10/21

## 2021-10-20 ENCOUNTER — Other Ambulatory Visit: Payer: Self-pay | Admitting: Family Medicine

## 2021-10-20 ENCOUNTER — Ambulatory Visit: Payer: Medicare Other

## 2021-10-20 ENCOUNTER — Encounter: Payer: Medicare Other | Admitting: Family Medicine

## 2021-10-20 NOTE — Addendum Note (Signed)
Addended by: Kris Mouton on: 10/20/2021 03:06 PM   Modules accepted: Orders

## 2021-10-20 NOTE — Telephone Encounter (Signed)
Benefits received. OOP cost is $0.  Lab appointment on 10/27/21 along with an office visit and Nurse visit on 11/14/21 at [facility] location

## 2021-10-22 ENCOUNTER — Telehealth: Payer: Self-pay | Admitting: Family Medicine

## 2021-10-22 DIAGNOSIS — I10 Essential (primary) hypertension: Secondary | ICD-10-CM

## 2021-10-22 DIAGNOSIS — R739 Hyperglycemia, unspecified: Secondary | ICD-10-CM

## 2021-10-22 DIAGNOSIS — E7800 Pure hypercholesterolemia, unspecified: Secondary | ICD-10-CM

## 2021-10-22 DIAGNOSIS — M810 Age-related osteoporosis without current pathological fracture: Secondary | ICD-10-CM

## 2021-10-22 NOTE — Telephone Encounter (Signed)
-----   Message from Ellamae Sia sent at 10/11/2021  8:23 AM EDT ----- Regarding: Lab orders for Monday, 10.31.22 Patient is scheduled for CPX labs, please order future labs, Thanks , Karna Christmas

## 2021-10-23 ENCOUNTER — Other Ambulatory Visit: Payer: Medicare Other

## 2021-10-23 NOTE — Telephone Encounter (Signed)
PLEASE NOTE: All timestamps contained within this report are represented as [place] Standard Time. CONFIDENTIALTY NOTICE: This fax transmission is intended only for the addressee. It contains information that is legally privileged, confidential or otherwise protected from use or disclosure. If you are not the intended recipient, you are strictly prohibited from reviewing, disclosing, copying using or disseminating any of this information or taking any action in reliance on or regarding this information. If you have received this fax in error, please notify us immediately by telephone so that we can arrange for its return to [place]. Phone: [phone], Toll-Free: [phone], Fax: [phone] Page: 1 of 1 Call Id: [id number] [place] Night - Client Nonclinical Telephone Record  [facility] Client [place] Night - Client Client Site [place] Physician Glori Bickers, [place] MD Contact Type Call Who Is Calling Patient / Member / Family / Caregiver Caller Name Lamb Phone Number [phone] Patient Name Camron Essman Patient DOB 1946/08/13 Call Type Message Only Information Provided Reason for Call Request for General Office Information Initial Comment Caller states she submitted a request for her zolpidem to be refilled. Caller needs this approved on Monday as she will be out on Wednesday. Disp. Time Disposition Final User 10/20/2021 5:10:13 PM General Information Provided Yes Elveria Royals Call Closed By: Elveria Royals Transaction Date/Time: 10/20/2021 5:07:47 PM (ET)

## 2021-10-23 NOTE — Telephone Encounter (Signed)
Called access nurse asking about a refill for zolpidem

## 2021-10-23 NOTE — Telephone Encounter (Signed)
I think it is too early ?

## 2021-10-23 NOTE — Telephone Encounter (Signed)
Name of Medication: Ambien Name of Pharmacy: [facility] or Written Date and Quantity:06/27/21 #30 tabs 3 refills Last Office Visit and Type: Hospital f/u 05/31/21 Next Office Visit and Type: CPE on 10/27/21 Last Controlled Substance Agreement Date: 05/27/13 Last UDS:05/27/13

## 2021-10-24 NOTE — Telephone Encounter (Signed)
See prev note, since July and Aug both have 31 days and Rx is for 30 pt did have to use 2 extra pills so instead of Rx being due on 10/27/21 it will be due on 10/25/21, please advise

## 2021-10-24 NOTE — Telephone Encounter (Signed)
Pt called checking on status of medication.I see where Dr.Tower says its to early. Can I tell her that?

## 2021-10-25 ENCOUNTER — Other Ambulatory Visit: Payer: Self-pay

## 2021-10-25 DIAGNOSIS — I824Z3 Acute embolism and thrombosis of unspecified deep veins of distal lower extremity, bilateral: Secondary | ICD-10-CM

## 2021-10-27 ENCOUNTER — Other Ambulatory Visit: Payer: Self-pay

## 2021-10-27 ENCOUNTER — Ambulatory Visit (INDEPENDENT_AMBULATORY_CARE_PROVIDER_SITE_OTHER): Payer: Medicare Other | Admitting: Family Medicine

## 2021-10-27 ENCOUNTER — Encounter: Payer: Self-pay | Admitting: Family Medicine

## 2021-10-27 VITALS — BP 136/80 | HR 94 | Temp 98.5°F | Ht 62.0 in | Wt 106.2 lb

## 2021-10-27 DIAGNOSIS — I10 Essential (primary) hypertension: Secondary | ICD-10-CM | POA: Diagnosis not present

## 2021-10-27 DIAGNOSIS — R739 Hyperglycemia, unspecified: Secondary | ICD-10-CM

## 2021-10-27 DIAGNOSIS — Z79899 Other long term (current) drug therapy: Secondary | ICD-10-CM | POA: Diagnosis not present

## 2021-10-27 DIAGNOSIS — I2510 Atherosclerotic heart disease of native coronary artery without angina pectoris: Secondary | ICD-10-CM | POA: Diagnosis not present

## 2021-10-27 DIAGNOSIS — M810 Age-related osteoporosis without current pathological fracture: Secondary | ICD-10-CM

## 2021-10-27 DIAGNOSIS — E7800 Pure hypercholesterolemia, unspecified: Secondary | ICD-10-CM | POA: Diagnosis not present

## 2021-10-27 DIAGNOSIS — Z0000 Encounter for general adult medical examination without abnormal findings: Secondary | ICD-10-CM

## 2021-10-27 DIAGNOSIS — I700 Atherosclerosis of aorta: Secondary | ICD-10-CM

## 2021-10-27 LAB — CBC WITH DIFFERENTIAL/PLATELET
Basophils Absolute: 0.1 10*3/uL (ref 0.0–0.1)
Basophils Relative: 0.9 % (ref 0.0–3.0)
Eosinophils Absolute: 0.4 10*3/uL (ref 0.0–0.7)
Eosinophils Relative: 6.9 % — ABNORMAL HIGH (ref 0.0–5.0)
HCT: 35.9 % — ABNORMAL LOW (ref 36.0–46.0)
Hemoglobin: 12.1 g/dL (ref 12.0–15.0)
Lymphocytes Relative: 3.9 % — ABNORMAL LOW (ref 12.0–46.0)
Lymphs Abs: 0.2 10*3/uL — ABNORMAL LOW (ref 0.7–4.0)
MCHC: 33.6 g/dL (ref 30.0–36.0)
MCV: 95.9 fl (ref 78.0–100.0)
Monocytes Absolute: 0.5 10*3/uL (ref 0.1–1.0)
Monocytes Relative: 9.5 % (ref 3.0–12.0)
Neutro Abs: 4.5 10*3/uL (ref 1.4–7.7)
Neutrophils Relative %: 78.8 % — ABNORMAL HIGH (ref 43.0–77.0)
Platelets: 366 10*3/uL (ref 150.0–400.0)
RBC: 3.74 Mil/uL — ABNORMAL LOW (ref 3.87–5.11)
RDW: 13.9 % (ref 11.5–15.5)
WBC: 5.8 10*3/uL (ref 4.0–10.5)

## 2021-10-27 LAB — COMPREHENSIVE METABOLIC PANEL
ALT: 6 U/L (ref 0–35)
AST: 14 U/L (ref 0–37)
Albumin: 3.3 g/dL — ABNORMAL LOW (ref 3.5–5.2)
Alkaline Phosphatase: 109 U/L (ref 39–117)
BUN: 17 mg/dL (ref 6–23)
CO2: 32 mEq/L (ref 19–32)
Calcium: 8.8 mg/dL (ref 8.4–10.5)
Chloride: 93 mEq/L — ABNORMAL LOW (ref 96–112)
Creatinine, Ser: 0.89 mg/dL (ref 0.40–1.20)
GFR: 62.64 mL/min (ref >60.00)
Glucose, Bld: 110 mg/dL — ABNORMAL HIGH (ref 70–99)
Potassium: 3.8 mEq/L (ref 3.5–5.1)
Sodium: 133 mEq/L — ABNORMAL LOW (ref 135–145)
Total Bilirubin: 0.6 mg/dL (ref 0.2–1.2)
Total Protein: 5.3 g/dL — ABNORMAL LOW (ref 6.0–8.3)

## 2021-10-27 LAB — HEMOGLOBIN A1C: Hgb A1c MFr Bld: 6 % (ref 4.6–6.5)

## 2021-10-27 LAB — LIPID PANEL
Cholesterol: 158 mg/dL (ref 0–200)
HDL: 45.2 mg/dL (ref >39.00)
LDL Cholesterol: 86 mg/dL (ref 0–99)
NonHDL: 112.46
Total CHOL/HDL Ratio: 3
Triglycerides: 134 mg/dL (ref 0.0–149.0)
VLDL: 26.8 mg/dL (ref 0.0–40.0)

## 2021-10-27 LAB — TSH: TSH: 3.51 u[IU]/mL (ref 0.35–5.50)

## 2021-10-27 LAB — VITAMIN B12: Vitamin B-12: 295 pg/mL (ref 211–911)

## 2021-10-27 LAB — VITAMIN D 25 HYDROXY (VIT D DEFICIENCY, FRACTURES): VITD: 57.31 ng/mL (ref 30.00–100.00)

## 2021-10-27 NOTE — Patient Instructions (Addendum)
Eat a balance diet Get more calories whenever you can   Read and stay social if and when you can   Take care of yourself  Let us know if you need anything   Be very careful not to fall

## 2021-10-27 NOTE — Assessment & Plan Note (Signed)
bp in fair control at this time  BP Readings from Last 1 Encounters:  10/27/21 136/80   No changes needed Most recent labs reviewed  Disc lifstyle change with low sodium diet and exercise  Plan to continue  Metoprolol 12.5 mg bid  bumex 0.5 mg daily
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
## 2021-10-28 NOTE — Assessment & Plan Note (Signed)
A1C ordered disc imp of low glycemic diet and wt loss to prevent DM2  Strongly enc inc in protein intake to avoid further wt loss

## 2021-10-28 NOTE — Assessment & Plan Note (Signed)
No symptoms or progression  Will continue to control cholesterol and BP

## 2021-10-28 NOTE — Assessment & Plan Note (Signed)
Disc goals for lipids and reasons to control them Rev last labs with pt Rev low sat fat diet in detail  Labs ordered Taking crestor 10 mg daily and tolerating

## 2021-10-28 NOTE — Assessment & Plan Note (Addendum)
Reviewed health habits including diet and exercise and skin cancer prevention Reviewed appropriate screening tests for age  Also reviewed health mt list, fam hx and immunization status , as well as social and family history   See HPI Labs ordered  Struggling to mt and gain wt in the setting of cancer tx Colonoscopy and mammogram utd Had flu vaccine  No shingrix per pt- oncology aware  dexa up to date, no fractures and taking vit D  Advance directive up to date  Discussed some recent changes in cognitive speed occurring in setting of chemotx and fatigue  Pt and fam aware  Good hearing screen  PHQ score of 0 Able to perform all ADLs now  Functional status has changed since starting cancer tx /generally weak

## 2021-10-28 NOTE — Assessment & Plan Note (Signed)
dexa 02/2021 reviewed Some  Improvement in bmd with prolia Plans to continue  Taking vit D, level ordered No new falls since last visit No fractures

## 2021-10-28 NOTE — Assessment & Plan Note (Signed)
Levels of B12 and D added to labs

## 2021-11-01 ENCOUNTER — Ambulatory Visit (INDEPENDENT_AMBULATORY_CARE_PROVIDER_SITE_OTHER): Payer: Medicare Other | Admitting: Physician Assistant

## 2021-11-01 ENCOUNTER — Ambulatory Visit (HOSPITAL_COMMUNITY)
Admission: RE | Admit: 2021-11-01 | Discharge: 2021-11-01 | Disposition: A | Discharge status: Home / Self Care | Payer: Medicare Other | Source: Ambulatory Visit | Attending: Vascular Surgery | Admitting: Vascular Surgery

## 2021-11-01 ENCOUNTER — Ambulatory Visit (INDEPENDENT_AMBULATORY_CARE_PROVIDER_SITE_OTHER)
Admission: RE | Admit: 2021-11-01 | Discharge: 2021-11-01 | Disposition: A | Discharge status: Home / Self Care | Payer: Medicare Other | Source: Ambulatory Visit | Attending: Vascular Surgery | Admitting: Vascular Surgery

## 2021-11-01 ENCOUNTER — Other Ambulatory Visit: Payer: Self-pay

## 2021-11-01 VITALS — BP 170/94 | HR 86 | Temp 97.8°F | Resp 20 | Ht 62.0 in | Wt 105.9 lb

## 2021-11-01 DIAGNOSIS — Z86718 Personal history of other venous thrombosis and embolism: Secondary | ICD-10-CM

## 2021-11-01 DIAGNOSIS — I824Z3 Acute embolism and thrombosis of unspecified deep veins of distal lower extremity, bilateral: Secondary | ICD-10-CM

## 2021-11-01 DIAGNOSIS — I871 Compression of vein: Secondary | ICD-10-CM

## 2021-11-01 NOTE — Progress Notes (Signed)
POST OPERATIVE OFFICE NOTE    CC:  F/u for surgery  HPI:  This is a 75 y.o. female who is s/p bilateral femoral, common femoral, external and common iliac vein intravascular ultrasound and intravascular ultrasound of IVC Mechanical thrombectomy of IVC, bilateral common iliac vein, bilateral external iliac vein, bilateral common femoral and femoral vein with Inari clottriever Removal of foreign body IVC  This was performed on March 15, 2021 by Dr. Donzetta Matters.  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  She presents today for routine follow-up.  She states she has chronic lower extremity edema.  She has compressive stockings for the edema.  Denies lower extremity pain.  She is continuing treatment for multiple myeloma and is currently receiving immunotherapy injections.  She continues on apixaban.   Allergies  Allergen Reactions   Bentyl [Dicyclomine Hcl] Other (See Comments)    Groggy, blurred vision   Butalbital-Aspirin-Caffeine Other (See Comments)    hallucinations   Clonidine Derivatives Other (See Comments)    dizziness, lightheadedness, abdominal cramping, dry mouth/throat   Linzess [Linaclotide] Diarrhea   Morphine And Related Other (See Comments)    Does not work   Motrin [Ibuprofen] Other (See Comments)    GI upset   Penicillins Other (See Comments)    As child; reaction unknown Has patient had a PCN reaction causing immediate rash, facial/tongue/throat swelling, SOB or lightheadedness with hypotension: No Has patient had a PCN reaction causing severe rash involving mucus membranes or skin necrosis: No Has patient had a PCN reaction that required hospitalization: No Has patient had a PCN reaction occurring within the last 10 years: No If all of the above answers are "NO", then may proceed with Cephalosporin use.   Sulfa  Antibiotics Other (See Comments)    In childhood   Zanaflex [Tizanidine Hcl] Other (See Comments)    Decreased BP   Diphenhydramine Hcl Palpitations    restlessness    Current Outpatient Medications  Medication Sig Dispense Refill   acetaminophen (TYLENOL) 325 MG tablet Take 2 tablets (650 mg total) by mouth every 6 (six) hours as needed for mild pain, fever or headache.     acyclovir (ZOVIRAX) 400 MG tablet Take 1 tablet (400 mg total) by mouth 2 (two) times daily. 180 tablet 3   albuterol (VENTOLIN HFA) 108 (90 Base) MCG/ACT inhaler Inhale 2 puffs by mouth into the lungs every 6 hours as needed for wheezing or shortness of breath 8.5 g 0   alclomethasone (ACLOVATE) 0.05 % ointment Apply 1 application topically daily as needed (dry lips). 30 g 0   ALPRAZolam (XANAX) 0.5 MG tablet TAKE 0.5 TABLETS (0.25 MG TOTAL) BY MOUTH DAILY AS NEEDED FOR ANXIETY OR SLEEP. 30 tablet 1   apixaban (ELIQUIS) 5 MG TABS tablet Take 1 tablet (5 mg total) by mouth 2 (two) times daily. 180 tablet 3   bumetanide (BUMEX) 0.5 MG tablet Take 1 tablet (0.5 mg total) by mouth daily for 7 days. Takes an additional tablet if needed 7 tablet 0   Calcium Carbonate Antacid (TUMS PO) Take 2-4 capsules by mouth daily as needed (heartburn).     Cholecalciferol (VITAMIN D) 2000 UNITS tablet Take 2,000 Units by mouth daily.     denosumab (PROLIA) 60 MG/ML SOSY injection Inject 60 mg into the skin every 6 (six) months.     diphenoxylate-atropine (LOMOTIL) 2.5-0.025 MG  tablet Take 1 tablet by mouth 4 (four) times daily as needed for diarrhea or loose stools. 10 tablet 0   esomeprazole (NEXIUM) 40 MG capsule Take 1 capsule (40 mg total) by mouth daily. 90 capsule 3   Melatonin 10 MG TABS Take 10 mg by mouth at bedtime.     metoprolol tartrate (LOPRESSOR) 25 MG tablet TAKE 1/2 TABLET (12.5 MG TOTAL) BY MOUTH TWO TIMES DAILY. 180 tablet 1   potassium chloride SA (KLOR-CON) 20 MEQ tablet Take 1 tablet (20 mEq total) by mouth 2 (two)  times daily. 60 tablet 1   prochlorperazine (COMPAZINE) 10 MG tablet Take 1 tablet (10 mg total) by mouth every 6 (six) hours as needed for nausea or vomiting. 30 tablet 0   Propylene Glycol (SYSTANE BALANCE OP) Place 1 drop into both eyes 2 (two) times daily.     rosuvastatin (CRESTOR) 10 MG tablet Take 1 tablet (10 mg total) by mouth daily. 90 tablet 3   traMADol (ULTRAM) 50 MG tablet TAKE 1 TO 2 TABLETS(50 TO 100 MG) BY MOUTH EVERY 6 HOURS AS NEEDED 30 tablet 0   zolpidem (AMBIEN) 10 MG tablet TAKE ONE TABLET BY MOUTH AT BEDTIME AS NEEDED FOR SLEEP 30 tablet 3   No current facility-administered medications for this visit.     ROS:  See HPI  BP (!) 170/94 (BP Location: Right Arm, Patient Position: Sitting, Cuff Size: Normal)   Pulse 86   Temp 97.8 F (36.6 C) (Temporal)   Resp 20   Ht _0  (1.575 m)   Wt 105 lb 14.4 oz (48 kg)   SpO2 91%   BMI 19.37 kg/m   Physical Exam:  General appearance: Awake, alert in no apparent distress Cardiac: Heart rate and rhythm are regular Respirations: Nonlabored Extremities: 2+ pitting edema of both lower legs.   Pulse/Doppler exam: Palpable left anterior tibial and right posterior tibial  pulses   Bilateral lower extremity venous duplex: Summary:  RIGHT:  - There is no evidence of deep vein thrombosis in the lower extremity.  - There is no evidence of superficial venous thrombosis.     LEFT:  - There is no evidence of deep vein thrombosis in the lower extremity.  - There is no evidence of superficial venous thrombosis.      *See table(s) above for measurements and observations.    Preliminary    IVC/iliac venous duplex: Summary:  No evidence of thrombus in the IVC and Iliac veins.     *See table(s) above for measurements and observations.      Preliminary     Assessment/Plan:  This is a 75 y.o. female who is s/p: bilateral femoral, common femoral, external and common iliac vein intravascular ultrasound and intravascular  ultrasound of IVC Mechanical thrombectomy of IVC, bilateral common iliac vein, bilateral external iliac vein, bilateral common femoral and femoral vein with Inari clottriever Removal of foreign body IVC  Venous duplex examinations of the IVC, iliac veins and lower extremity veins revealed no evidence of DVT.  She does have residual edema and I discussed with her daily elevation, use of compression stockings and exercise.  She is given written instructions in this regard.  I discussed her anticoagulation therapy and follow-up with Dr. Donzetta Matters.  He recommends she continue on apixaban throughout her treatment for multiple myeloma/amyloidosis.  Once she is through this medical treatment she can come off the anticoagulation from a vascular surgery standpoint.  She states she sees Dr. Narda Rutherford at [facility] and I will route this note to him.  Follow-up as needed.  Risa Grill, PA-C [facility] [phone]  Clinic MD: Dr. Donzetta Matters

## 2021-11-01 NOTE — Telephone Encounter (Signed)
CrCl is 40.11 mL/min. Calcium was 8.8 on 10/27/21 Ok to proceed.

## 2021-11-02 ENCOUNTER — Inpatient Hospital Stay: Payer: Medicare Other | Attending: Hematology and Oncology

## 2021-11-02 ENCOUNTER — Inpatient Hospital Stay (HOSPITAL_BASED_OUTPATIENT_CLINIC_OR_DEPARTMENT_OTHER): Payer: Medicare Other | Admitting: Hematology and Oncology

## 2021-11-02 ENCOUNTER — Inpatient Hospital Stay: Payer: Medicare Other

## 2021-11-02 VITALS — BP 151/79 | HR 81 | Temp 97.0°F | Resp 17 | Wt 107.0 lb

## 2021-11-02 DIAGNOSIS — Z5112 Encounter for antineoplastic immunotherapy: Secondary | ICD-10-CM | POA: Diagnosis present

## 2021-11-02 DIAGNOSIS — E8581 Light chain (AL) amyloidosis: Secondary | ICD-10-CM

## 2021-11-02 DIAGNOSIS — R803 Bence Jones proteinuria: Secondary | ICD-10-CM | POA: Diagnosis not present

## 2021-11-02 LAB — CMP (CANCER CENTER ONLY)
ALT: 9 U/L (ref 0–44)
AST: 18 U/L (ref 15–41)
Albumin: 2.8 g/dL — ABNORMAL LOW (ref 3.5–5.0)
Alkaline Phosphatase: 119 U/L (ref 38–126)
Anion gap: 10 (ref 5–15)
BUN: 18 mg/dL (ref 8–23)
CO2: 30 mmol/L (ref 22–32)
Calcium: 8.9 mg/dL (ref 8.9–10.3)
Chloride: 94 mmol/L — ABNORMAL LOW (ref 98–111)
Creatinine: 0.81 mg/dL (ref 0.44–1.00)
GFR, Estimated: >60 mL/min (ref >60)
Glucose, Bld: 102 mg/dL — ABNORMAL HIGH (ref 70–99)
Potassium: 2.9 mmol/L — ABNORMAL LOW (ref 3.5–5.1)
Sodium: 134 mmol/L — ABNORMAL LOW (ref 135–145)
Total Bilirubin: 0.5 mg/dL (ref 0.3–1.2)
Total Protein: 5.7 g/dL — ABNORMAL LOW (ref 6.5–8.1)

## 2021-11-02 LAB — CBC WITH DIFFERENTIAL (CANCER CENTER ONLY)
Abs Immature Granulocytes: 0.03 10*3/uL (ref 0.00–0.07)
Basophils Absolute: 0.1 10*3/uL (ref 0.0–0.1)
Basophils Relative: 1 %
Eosinophils Absolute: 0.2 10*3/uL (ref 0.0–0.5)
Eosinophils Relative: 3 %
HCT: 33.4 % — ABNORMAL LOW (ref 36.0–46.0)
Hemoglobin: 11.3 g/dL — ABNORMAL LOW (ref 12.0–15.0)
Immature Granulocytes: 1 %
Lymphocytes Relative: 5 %
Lymphs Abs: 0.3 10*3/uL — ABNORMAL LOW (ref 0.7–4.0)
MCH: 31.5 pg (ref 26.0–34.0)
MCHC: 33.8 g/dL (ref 30.0–36.0)
MCV: 93 fL (ref 80.0–100.0)
Monocytes Absolute: 0.5 10*3/uL (ref 0.1–1.0)
Monocytes Relative: 8 %
Neutro Abs: 5.1 10*3/uL (ref 1.7–7.7)
Neutrophils Relative %: 82 %
Platelet Count: 414 10*3/uL — ABNORMAL HIGH (ref 150–400)
RBC: 3.59 MIL/uL — ABNORMAL LOW (ref 3.87–5.11)
RDW: 13.2 % (ref 11.5–15.5)
WBC Count: 6.2 10*3/uL (ref 4.0–10.5)
nRBC: 0 % (ref 0.0–0.2)

## 2021-11-02 LAB — LACTATE DEHYDROGENASE: LDH: 319 U/L — ABNORMAL HIGH (ref 98–192)

## 2021-11-02 MED ORDER — DEXAMETHASONE 4 MG PO TABS
10.0000 mg | ORAL_TABLET | Freq: Once | ORAL | Status: AC
  Administered 2021-11-02: 10 mg via ORAL
  Filled 2021-11-02: qty 3

## 2021-11-02 MED ORDER — ACETAMINOPHEN 325 MG PO TABS: 650.0000 mg | ORAL_TABLET | Freq: Once | ORAL | Status: DC

## 2021-11-02 MED ORDER — DARATUMUMAB-HYALURONIDASE-FIHJ 1800-30000 MG-UT/15ML ~~LOC~~ SOLN
1800.0000 mg | Freq: Once | SUBCUTANEOUS | Status: AC
  Administered 2021-11-02: 1800 mg via SUBCUTANEOUS
  Filled 2021-11-02: qty 15

## 2021-11-02 NOTE — Patient Instructions (Signed)
[facility]   ?Discharge Instructions: ?Thank you for choosing [facility] to provide your oncology and hematology care.  ? ?If you have a lab appointment with the Cancer Center, please go directly to the Cancer Center and check in at the registration area. ?  ?Wear comfortable clothing and clothing appropriate for easy access to any Portacath or PICC line.  ? ?We strive to give you quality time with your provider. You may need to reschedule your appointment if you arrive late (15 or more minutes).  Arriving late affects you and other patients whose appointments are after yours.  Also, if you miss three or more appointments without notifying the office, you may be dismissed from the clinic at the provider?s discretion.    ?  ?For prescription refill requests, have your pharmacy contact our office and allow 72 hours for refills to be completed.   ? ?Today you received the following chemotherapy and/or immunotherapy agents: daratumumab-hyaluronidase    ?  ?To help prevent nausea and vomiting after your treatment, we encourage you to take your nausea medication as directed. ? ?BELOW ARE SYMPTOMS THAT SHOULD BE REPORTED IMMEDIATELY: ?*FEVER GREATER THAN 100.4 F (38 ?C) OR HIGHER ?*CHILLS OR SWEATING ?*NAUSEA AND VOMITING THAT IS NOT CONTROLLED WITH YOUR NAUSEA MEDICATION ?*UNUSUAL SHORTNESS OF BREATH ?*UNUSUAL BRUISING OR BLEEDING ?*URINARY PROBLEMS (pain or burning when urinating, or frequent urination) ?*BOWEL PROBLEMS (unusual diarrhea, constipation, pain near the anus) ?TENDERNESS IN MOUTH AND THROAT WITH OR WITHOUT PRESENCE OF ULCERS (sore throat, sores in mouth, or a toothache) ?UNUSUAL RASH, SWELLING OR PAIN  ?UNUSUAL VAGINAL DISCHARGE OR ITCHING  ? ?Items with * indicate a potential emergency and should be followed up as soon as possible or go to the Emergency Department if any problems should occur. ? ?Please show the CHEMOTHERAPY ALERT CARD or IMMUNOTHERAPY ALERT  CARD at check-in to the Emergency Department and triage nurse. ? ?Should you have questions after your visit or need to cancel or reschedule your appointment, please contact [facility]  Dept: [phone]  and follow the prompts.  Office hours are 8:00 a.m. to 4:30 p.m. Monday - Friday. Please note that voicemails left after 4:00 p.m. may not be returned until the following business day.  We are closed weekends and major holidays. You have access to a nurse at all times for urgent questions. Please call the main number to the clinic Dept: [phone] and follow the prompts. ? ? ?For any non-urgent questions, you may also contact your provider using MyChart. We now offer e-Visits for anyone 18 and older to request care online for non-urgent symptoms. For details visit [email or web address]. ?  ?Also download the MyChart app! Go to the app store, search "MyChart", open the app, select [facility], and log in with your MyChart username and password. ? ?Due to Covid, a mask is required upon entering the hospital/clinic. If you do not have a mask, one will be given to you upon arrival. For doctor visits, patients may have 1 support person aged 18 or older with them. For treatment visits, patients cannot have anyone with them due to current Covid guidelines and our immunocompromised population.  ? ?

## 2021-11-02 NOTE — Progress Notes (Signed)
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 q8H PRN and compazine 155mPO q6H for nausea --acyclovir 4005mO BID for VCZ prophylaxis --albuterol for possible bronchospasm with daratumumab --Patient declines port at this time -- no pain medication required at this time.   No orders of the defined types were placed in this encounter.  All questions were answered. The patient knows to call the clinic with any problems, questions or concerns.  A total of more than 30 minutes were spent on this encounter and over half of that time was spent on counseling and coordination of care as outlined above.   JohLedell PeoplesD Department of Hematology/Oncology [place]  [facility]: [phone]: [phone]: [email or web address]  11/02/2021 11:47 AM

## 2021-11-03 LAB — KAPPA/LAMBDA LIGHT CHAINS
Kappa free light chain: 14 mg/L (ref 3.3–19.4)
Kappa, lambda light chain ratio: 0.33 (ref 0.26–1.65)
Lambda free light chains: 42.3 mg/L — ABNORMAL HIGH (ref 5.7–26.3)

## 2021-11-07 LAB — MULTIPLE MYELOMA PANEL, SERUM
Albumin SerPl Elph-Mcnc: 2.7 g/dL — ABNORMAL LOW (ref 2.9–4.4)
Albumin/Glob SerPl: 1.2 (ref 0.7–1.7)
Alpha 1: 0.4 g/dL (ref 0.0–0.4)
Alpha2 Glob SerPl Elph-Mcnc: 0.9 g/dL (ref 0.4–1.0)
B-Globulin SerPl Elph-Mcnc: 0.8 g/dL (ref 0.7–1.3)
Gamma Glob SerPl Elph-Mcnc: 0.3 g/dL — ABNORMAL LOW (ref 0.4–1.8)
Globulin, Total: 2.4 g/dL (ref 2.2–3.9)
IgA: 59 mg/dL — ABNORMAL LOW (ref 64–422)
IgG (Immunoglobin G), Serum: 264 mg/dL — ABNORMAL LOW (ref 586–1602)
IgM (Immunoglobulin M), Srm: 63 mg/dL (ref 26–217)
M Protein SerPl Elph-Mcnc: 0.1 g/dL — ABNORMAL HIGH
Total Protein ELP: 5.1 g/dL — ABNORMAL LOW (ref 6.0–8.5)

## 2021-11-08 ENCOUNTER — Other Ambulatory Visit: Payer: Self-pay | Admitting: *Deleted

## 2021-11-08 ENCOUNTER — Telehealth: Payer: Self-pay | Admitting: Family Medicine

## 2021-11-08 DIAGNOSIS — R300 Dysuria: Secondary | ICD-10-CM

## 2021-11-08 DIAGNOSIS — R350 Frequency of micturition: Secondary | ICD-10-CM

## 2021-11-08 NOTE — Telephone Encounter (Signed)
Pt would like it called in at [id number] - [place], [place].

## 2021-11-08 NOTE — Telephone Encounter (Signed)
I need a urine sample Can she leave one at the [place] office tomorrow?   Sterilize (boil) glass jar and get a sample if she can, keep in fridge until tomorrow  Or just give sample then if she thinks she can wait  Can start abx as soon as she gives a sample Please confirm drug allergies , pcn and sulfa, correct?

## 2021-11-08 NOTE — Telephone Encounter (Signed)
Please call and get details from her, thanks

## 2021-11-08 NOTE — Telephone Encounter (Signed)
Called pt and for the last 3 days she has had Dysuria, urinary urgency, small amount of urinary incontinence, pt said every day her sxs are worsening. Pt said she tried to drink cranberry juice but that didn't help so she stopped. Pt did admit that she wasn't drinking much water due to the worsening UTI sxs she didn't want to keep "running to the bathroom". Pt said she has no fever no chills and no change in urine color. Pt said overall she just feels "bad"

## 2021-11-08 NOTE — Telephone Encounter (Signed)
Pt notified and appt scheduled with [facility] to leave urine sample in the morning

## 2021-11-08 NOTE — Telephone Encounter (Signed)
Forgot to mention in prev post. Pt did confirm her allergies to pcn and sulfa.  Pt will get UA/ cx done tomorrow at 8:45am at [facility] location   FYI to PCP

## 2021-11-08 NOTE — Telephone Encounter (Signed)
Pt called stating that she has urinary tract and would like a antibiotic called in. I told pt she would need an appt pt refused.

## 2021-11-09 ENCOUNTER — Other Ambulatory Visit: Payer: Self-pay

## 2021-11-09 ENCOUNTER — Telehealth: Payer: Self-pay | Admitting: Family Medicine

## 2021-11-09 ENCOUNTER — Ambulatory Visit (INDEPENDENT_AMBULATORY_CARE_PROVIDER_SITE_OTHER): Payer: Medicare Other

## 2021-11-09 ENCOUNTER — Other Ambulatory Visit: Payer: Self-pay | Admitting: Family Medicine

## 2021-11-09 DIAGNOSIS — R300 Dysuria: Secondary | ICD-10-CM | POA: Diagnosis not present

## 2021-11-09 DIAGNOSIS — R350 Frequency of micturition: Secondary | ICD-10-CM | POA: Diagnosis not present

## 2021-11-09 LAB — POC URINALSYSI DIPSTICK (AUTOMATED)
Bilirubin, UA: NEGATIVE
Blood, UA: POSITIVE
Glucose, UA: NEGATIVE
Ketones, UA: NEGATIVE
Nitrite, UA: NEGATIVE
Protein, UA: POSITIVE — AB
Spec Grav, UA: 1.02 (ref 1.010–1.025)
Urobilinogen, UA: 0.2 E.U./dL
pH, UA: 6 (ref 5.0–8.0)

## 2021-11-09 MED ORDER — NITROFURANTOIN MONOHYD MACRO 100 MG PO CAPS: 100.0000 mg | ORAL_CAPSULE | Freq: Two times a day (BID) | ORAL | 0 refills | Status: DC

## 2021-11-09 NOTE — Telephone Encounter (Signed)
Urine is mildly positive for wbc and rbc   I pended macrobid to sent to pharmacy of choice   Culture is pending   Please alert [place] if symptoms suddenly worsen or change

## 2021-11-09 NOTE — Telephone Encounter (Signed)
I sent it to [facility]  Keep [place] posted

## 2021-11-09 NOTE — Telephone Encounter (Signed)
Did she get her UA at [facility] yet?

## 2021-11-09 NOTE — Progress Notes (Signed)
Per orders of Dr. Glori Bickers, urine sample obtained, and POC UA/ Urine culture performed.

## 2021-11-11 LAB — URINE CULTURE
MICRO NUMBER:: 12651729
SPECIMEN QUALITY:: ADEQUATE

## 2021-11-13 ENCOUNTER — Encounter: Payer: Self-pay | Admitting: Family Medicine

## 2021-11-13 NOTE — Telephone Encounter (Signed)
Pt called returning a call about labs  

## 2021-11-13 NOTE — Telephone Encounter (Signed)
Completed in result notes.

## 2021-11-14 ENCOUNTER — Ambulatory Visit (INDEPENDENT_AMBULATORY_CARE_PROVIDER_SITE_OTHER): Payer: Medicare Other

## 2021-11-14 ENCOUNTER — Other Ambulatory Visit: Payer: Self-pay

## 2021-11-14 DIAGNOSIS — M810 Age-related osteoporosis without current pathological fracture: Secondary | ICD-10-CM

## 2021-11-14 MED ORDER — DENOSUMAB 60 MG/ML ~~LOC~~ SOSY
60.0000 mg | PREFILLED_SYRINGE | Freq: Once | SUBCUTANEOUS | Status: AC
Start: 2021-11-14 — End: 2021-11-14
  Administered 2021-11-14: 60 mg via SUBCUTANEOUS

## 2021-11-14 NOTE — Progress Notes (Signed)
Per orders of Dr. Tower, injection of Prolia given by Audrinna Sherman G [place] Firkus. ?Patient tolerated injection well.  ? ?
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
## 2021-11-21 ENCOUNTER — Ambulatory Visit (INDEPENDENT_AMBULATORY_CARE_PROVIDER_SITE_OTHER): Payer: Medicare Other

## 2021-11-21 VITALS — Ht 62.0 in | Wt 107.0 lb

## 2021-11-21 DIAGNOSIS — Z0000 Encounter for general adult medical examination without abnormal findings: Secondary | ICD-10-CM | POA: Diagnosis not present

## 2021-11-21 NOTE — Patient Instructions (Signed)
Ms. Wendy [name] , Thank you for taking time to complete Medicare Wellness Visit. I appreciate your ongoing commitment to your health goals. Please review the following plan we discussed and let me know if I can assist you in the future.   Screening recommendations/referrals: Colonoscopy: no longer required Mammogram: due, last completed 11/30/20, you plan on scheduling your appointment Bone Density: up to date, completed 02/21/21 due 02/21/22 Recommended yearly ophthalmology/optometry visit for glaucoma screening and checkup Recommended yearly dental visit for hygiene and checkup  Vaccinations: Influenza vaccine: up to date Pneumococcal vaccine: up to date Tdap vaccine: up to date, completed 02/22/18, due 02/23/28 Shingles vaccine: Discuss with your local pharmacy   Covid-19: Discuss with your local pharmacy   Advanced directives: Please bring a copy of Living Will and/or [place] for your chart.   Conditions/risks identified: see problem list   Next appointment: Follow up in one year for your annual wellness visit 11/27/22 @ 11:15am, this will be a telephone visit   Preventive Care 65 Years and Older, Female Preventive care refers to lifestyle choices and visits with your health care provider that can promote health and wellness. What does preventive care include? A yearly physical exam. This is also called an annual well check. Dental exams once or twice a year. Routine eye exams. Ask your health care provider how often you should have your eyes checked. Personal lifestyle choices, including: Daily care of your teeth and gums. Regular physical activity. Eating a healthy diet. Avoiding tobacco and drug use. Limiting alcohol use. Practicing safe sex. Taking low-dose aspirin every day. Taking vitamin and mineral supplements as recommended by your health care provider. What happens during an annual well check? The services and screenings done by your health care provider  during your annual well check will depend on your age, overall health, lifestyle risk factors, and family history of disease. Counseling  Your health care provider may ask you questions about your: Alcohol use. Tobacco use. Drug use. Emotional well-being. Home and relationship well-being. Sexual activity. Eating habits. History of falls. Memory and ability to understand (cognition). Work and work Statistician. Reproductive health. Screening  You may have the following tests or measurements: Height, weight, and BMI. Blood pressure. Lipid and cholesterol levels. These may be checked every 5 years, or more frequently if you are over 54 years old. Skin check. Lung cancer screening. You may have this screening every year starting at age 30 if you have a 30-pack-year history of smoking and currently smoke or have quit within the past 15 years. Fecal occult blood test (FOBT) of the stool. You may have this test every year starting at age 39. Flexible sigmoidoscopy or colonoscopy. You may have a sigmoidoscopy every 5 years or a colonoscopy every 10 years starting at age 76. Hepatitis C blood test. Hepatitis B blood test. Sexually transmitted disease (STD) testing. Diabetes screening. This is done by checking your blood sugar (glucose) after you have not eaten for a while (fasting). You may have this done every 1-3 years. Bone density scan. This is done to screen for osteoporosis. You may have this done starting at age 68. Mammogram. This may be done every 1-2 years. Talk to your health care provider about how often you should have regular mammograms. Talk with your health care provider about your test results, treatment options, and if necessary, the need for more tests. Vaccines  Your health care provider may recommend certain vaccines, such as: Influenza vaccine. This is recommended every year.  Tetanus, diphtheria, and acellular pertussis (Tdap, Td) vaccine. You may need a Td booster every  10 years. Zoster vaccine. You may need this after age 89. Pneumococcal 13-valent conjugate (PCV13) vaccine. One dose is recommended after age 44. Pneumococcal polysaccharide (PPSV23) vaccine. One dose is recommended after age 36. Talk to your health care provider about which screenings and vaccines you need and how often you need them. This information is not intended to replace advice given to you by your health care provider. Make sure you discuss any questions you have with your health care provider. Document Released: 01/06/2016 Document Revised: 08/29/2016 Document Reviewed: 10/11/2015 Elsevier Interactive Patient Education  2017 [place] Prevention in the Home Falls can cause injuries. They can happen to people of all ages. There are many things you can do to make your home safe and to help prevent falls. What can I do on the outside of my home? Regularly fix the edges of walkways and driveways and fix any cracks. Remove anything that might make you trip as you walk through a door, such as a raised step or threshold. Trim any bushes or trees on the path to your home. Use bright outdoor lighting. Clear any walking paths of anything that might make someone trip, such as rocks or tools. Regularly check to see if handrails are loose or broken. Make sure that both sides of any steps have handrails. Any raised decks and porches should have guardrails on the edges. Have any leaves, snow, or ice cleared regularly. Use sand or salt on walking paths during winter. Clean up any spills in your garage right away. This includes oil or grease spills. What can I do in the bathroom? Use night lights. Install grab bars by the toilet and in the tub and shower. Do not use towel bars as grab bars. Use non-skid mats or decals in the tub or shower. If you need to sit down in the shower, use a plastic, non-slip stool. Keep the floor dry. Clean up any water that spills on the floor as soon as it  happens. Remove soap buildup in the tub or shower regularly. Attach bath mats securely with double-sided non-slip rug tape. Do not have throw rugs and other things on the floor that can make you trip. What can I do in the bedroom? Use night lights. Make sure that you have a light by your bed that is easy to reach. Do not use any sheets or blankets that are too big for your bed. They should not hang down onto the floor. Have a firm chair that has side arms. You can use this for support while you get dressed. Do not have throw rugs and other things on the floor that can make you trip. What can I do in the kitchen? Clean up any spills right away. Avoid walking on wet floors. Keep items that you use a lot in easy-to-reach places. If you need to reach something above you, use a strong step stool that has a grab bar. Keep electrical cords out of the way. Do not use floor polish or wax that makes floors slippery. If you must use wax, use non-skid floor wax. Do not have throw rugs and other things on the floor that can make you trip. What can I do with my stairs? Do not leave any items on the stairs. Make sure that there are handrails on both sides of the stairs and use them. Fix handrails that are broken or loose.  Make sure that handrails are as long as the stairways. Check any carpeting to make sure that it is firmly attached to the stairs. Fix any carpet that is loose or worn. Avoid having throw rugs at the top or bottom of the stairs. If you do have throw rugs, attach them to the floor with carpet tape. Make sure that you have a light switch at the top of the stairs and the bottom of the stairs. If you do not have them, ask someone to add them for you. What else can I do to help prevent falls? Wear shoes that: Do not have high heels. Have rubber bottoms. Are comfortable and fit you well. Are closed at the toe. Do not wear sandals. If you use a stepladder: Make sure that it is fully opened.  Do not climb a closed stepladder. Make sure that both sides of the stepladder are locked into place. Ask someone to hold it for you, if possible. Clearly mark and make sure that you can see: Any grab bars or handrails. First and last steps. Where the edge of each step is. Use tools that help you move around (mobility aids) if they are needed. These include: Canes. Walkers. Scooters. Crutches. Turn on the lights when you go into a dark area. Replace any light bulbs as soon as they burn out. Set up your furniture so you have a clear path. Avoid moving your furniture around. If any of your floors are uneven, fix them. If there are any pets around you, be aware of where they are. Review your medicines with your doctor. Some medicines can make you feel dizzy. This can increase your chance of falling. Ask your doctor what other things that you can do to help prevent falls. This information is not intended to replace advice given to you by your health care provider. Make sure you discuss any questions you have with your health care provider. Document Released: 10/06/2009 Document Revised: 05/17/2016 Document Reviewed: 01/14/2015 Elsevier Interactive Patient Education  2017 [employer].

## 2021-11-25 ENCOUNTER — Other Ambulatory Visit: Payer: Self-pay | Admitting: Family Medicine

## 2021-11-27 NOTE — Telephone Encounter (Signed)
Name of Medication: Xanax Name of Pharmacy: [facility] or Written Date and Quantity: 07/27/21 #30 tabs with 1 refill Last Office Visit and Type: CPE on 10/27/21 Next Office Visit and Type: none scheduled

## 2021-11-30 ENCOUNTER — Inpatient Hospital Stay: Payer: Medicare Other | Attending: Hematology and Oncology

## 2021-11-30 ENCOUNTER — Encounter: Payer: Self-pay | Admitting: Hematology and Oncology

## 2021-11-30 ENCOUNTER — Other Ambulatory Visit: Payer: Self-pay

## 2021-11-30 ENCOUNTER — Inpatient Hospital Stay: Payer: Medicare Other

## 2021-11-30 ENCOUNTER — Inpatient Hospital Stay (HOSPITAL_BASED_OUTPATIENT_CLINIC_OR_DEPARTMENT_OTHER): Payer: Medicare Other | Admitting: Hematology and Oncology

## 2021-11-30 VITALS — BP 172/89 | HR 82 | Temp 97.8°F | Resp 18 | Wt 111.1 lb

## 2021-11-30 DIAGNOSIS — E871 Hypo-osmolality and hyponatremia: Secondary | ICD-10-CM | POA: Diagnosis not present

## 2021-11-30 DIAGNOSIS — R809 Proteinuria, unspecified: Secondary | ICD-10-CM | POA: Insufficient documentation

## 2021-11-30 DIAGNOSIS — E8581 Light chain (AL) amyloidosis: Secondary | ICD-10-CM

## 2021-11-30 DIAGNOSIS — K219 Gastro-esophageal reflux disease without esophagitis: Secondary | ICD-10-CM | POA: Diagnosis not present

## 2021-11-30 DIAGNOSIS — Z801 Family history of malignant neoplasm of trachea, bronchus and lung: Secondary | ICD-10-CM | POA: Insufficient documentation

## 2021-11-30 DIAGNOSIS — C8581 Other specified types of non-Hodgkin lymphoma, lymph nodes of head, face, and neck: Secondary | ICD-10-CM | POA: Insufficient documentation

## 2021-11-30 DIAGNOSIS — G4700 Insomnia, unspecified: Secondary | ICD-10-CM | POA: Diagnosis not present

## 2021-11-30 DIAGNOSIS — Z86010 Personal history of colonic polyps: Secondary | ICD-10-CM | POA: Diagnosis not present

## 2021-11-30 DIAGNOSIS — Z87891 Personal history of nicotine dependence: Secondary | ICD-10-CM | POA: Diagnosis not present

## 2021-11-30 DIAGNOSIS — Z5112 Encounter for antineoplastic immunotherapy: Secondary | ICD-10-CM | POA: Insufficient documentation

## 2021-11-30 DIAGNOSIS — Z79899 Other long term (current) drug therapy: Secondary | ICD-10-CM | POA: Diagnosis not present

## 2021-11-30 DIAGNOSIS — Z86718 Personal history of other venous thrombosis and embolism: Secondary | ICD-10-CM | POA: Diagnosis not present

## 2021-11-30 DIAGNOSIS — M810 Age-related osteoporosis without current pathological fracture: Secondary | ICD-10-CM | POA: Insufficient documentation

## 2021-11-30 DIAGNOSIS — I10 Essential (primary) hypertension: Secondary | ICD-10-CM | POA: Diagnosis not present

## 2021-11-30 DIAGNOSIS — Z7901 Long term (current) use of anticoagulants: Secondary | ICD-10-CM | POA: Diagnosis not present

## 2021-11-30 DIAGNOSIS — R803 Bence Jones proteinuria: Secondary | ICD-10-CM | POA: Diagnosis not present

## 2021-11-30 LAB — CMP (CANCER CENTER ONLY)
ALT: 14 U/L (ref 0–44)
AST: 25 U/L (ref 15–41)
Albumin: 3 g/dL — ABNORMAL LOW (ref 3.5–5.0)
Alkaline Phosphatase: 94 U/L (ref 38–126)
Anion gap: 11 (ref 5–15)
BUN: 16 mg/dL (ref 8–23)
CO2: 24 mmol/L (ref 22–32)
Calcium: 8.7 mg/dL — ABNORMAL LOW (ref 8.9–10.3)
Chloride: 101 mmol/L (ref 98–111)
Creatinine: 0.83 mg/dL (ref 0.44–1.00)
GFR, Estimated: >60 mL/min (ref >60)
Glucose, Bld: 99 mg/dL (ref 70–99)
Potassium: 3.4 mmol/L — ABNORMAL LOW (ref 3.5–5.1)
Sodium: 136 mmol/L (ref 135–145)
Total Bilirubin: 0.5 mg/dL (ref 0.3–1.2)
Total Protein: 5.9 g/dL — ABNORMAL LOW (ref 6.5–8.1)

## 2021-11-30 LAB — CBC WITH DIFFERENTIAL (CANCER CENTER ONLY)
Abs Immature Granulocytes: 0.01 10*3/uL (ref 0.00–0.07)
Basophils Absolute: 0.1 10*3/uL (ref 0.0–0.1)
Basophils Relative: 1 %
Eosinophils Absolute: 0.1 10*3/uL (ref 0.0–0.5)
Eosinophils Relative: 2 %
HCT: 34.6 % — ABNORMAL LOW (ref 36.0–46.0)
Hemoglobin: 11.7 g/dL — ABNORMAL LOW (ref 12.0–15.0)
Immature Granulocytes: 0 %
Lymphocytes Relative: 13 %
Lymphs Abs: 0.7 10*3/uL (ref 0.7–4.0)
MCH: 31.5 pg (ref 26.0–34.0)
MCHC: 33.8 g/dL (ref 30.0–36.0)
MCV: 93 fL (ref 80.0–100.0)
Monocytes Absolute: 0.6 10*3/uL (ref 0.1–1.0)
Monocytes Relative: 11 %
Neutro Abs: 4.2 10*3/uL (ref 1.7–7.7)
Neutrophils Relative %: 73 %
Platelet Count: 346 10*3/uL (ref 150–400)
RBC: 3.72 MIL/uL — ABNORMAL LOW (ref 3.87–5.11)
RDW: 14.8 % (ref 11.5–15.5)
WBC Count: 5.7 10*3/uL (ref 4.0–10.5)
nRBC: 0 % (ref 0.0–0.2)

## 2021-11-30 LAB — LACTATE DEHYDROGENASE: LDH: 313 U/L — ABNORMAL HIGH (ref 98–192)

## 2021-11-30 MED ORDER — ACETAMINOPHEN 325 MG PO TABS
650.0000 mg | ORAL_TABLET | Freq: Once | ORAL | Status: AC
  Administered 2021-11-30: 650 mg via ORAL
  Filled 2021-11-30: qty 2

## 2021-11-30 MED ORDER — DEXAMETHASONE 4 MG PO TABS
10.0000 mg | ORAL_TABLET | Freq: Once | ORAL | Status: AC
  Administered 2021-11-30: 10 mg via ORAL
  Filled 2021-11-30: qty 3

## 2021-11-30 MED ORDER — DARATUMUMAB-HYALURONIDASE-FIHJ 1800-30000 MG-UT/15ML ~~LOC~~ SOLN
1800.0000 mg | Freq: Once | SUBCUTANEOUS | Status: AC
  Administered 2021-11-30: 1800 mg via SUBCUTANEOUS
  Filled 2021-11-30: qty 15

## 2021-11-30 NOTE — Patient Instructions (Signed)
[facility]  Discharge Instructions: Thank you for choosing [facility] to provide your oncology and hematology care.   If you have a lab appointment with the Cancer Center, please go directly to the Cancer Center and check in at the registration area.   Wear comfortable clothing and clothing appropriate for easy access to any Portacath or PICC line.   We strive to give you quality time with your provider. You may need to reschedule your appointment if you arrive late (15 or more minutes).  Arriving late affects you and other patients whose appointments are after yours.  Also, if you miss three or more appointments without notifying the office, you may be dismissed from the clinic at the provider's discretion.      For prescription refill requests, have your pharmacy contact our office and allow 72 hours for refills to be completed.    Today you received the following chemotherapy and/or immunotherapy agents darzalex      To help prevent nausea and vomiting after your treatment, we encourage you to take your nausea medication as directed.  BELOW ARE SYMPTOMS THAT SHOULD BE REPORTED IMMEDIATELY: *FEVER GREATER THAN 100.4 F (38 C) OR HIGHER *CHILLS OR SWEATING *NAUSEA AND VOMITING THAT IS NOT CONTROLLED WITH YOUR NAUSEA MEDICATION *UNUSUAL SHORTNESS OF BREATH *UNUSUAL BRUISING OR BLEEDING *URINARY PROBLEMS (pain or burning when urinating, or frequent urination) *BOWEL PROBLEMS (unusual diarrhea, constipation, pain near the anus) TENDERNESS IN MOUTH AND THROAT WITH OR WITHOUT PRESENCE OF ULCERS (sore throat, sores in mouth, or a toothache) UNUSUAL RASH, SWELLING OR PAIN  UNUSUAL VAGINAL DISCHARGE OR ITCHING   Items with * indicate a potential emergency and should be followed up as soon as possible or go to the Emergency Department if any problems should occur.  Please show the CHEMOTHERAPY ALERT CARD or IMMUNOTHERAPY ALERT CARD at check-in to  the Emergency Department and triage nurse.  Should you have questions after your visit or need to cancel or reschedule your appointment, please contact [facility]  Dept: [phone]  and follow the prompts.  Office hours are 8:00 a.m. to 4:30 p.m. Monday - Friday. Please note that voicemails left after 4:00 p.m. may not be returned until the following business day.  We are closed weekends and major holidays. You have access to a nurse at all times for urgent questions. Please call the main number to the clinic Dept: [phone] and follow the prompts.   For any non-urgent questions, you may also contact your provider using MyChart. We now offer e-Visits for anyone 18 and older to request care online for non-urgent symptoms. For details visit [email or web address].   Also download the MyChart app! Go to the app store, search "MyChart", open the app, select [facility], and log in with your MyChart username and password.  Due to Covid, a mask is required upon entering the hospital/clinic. If you do not have a mask, one will be given to you upon arrival. For doctor visits, patients may have 1 support person aged 18 or older with them. For treatment visits, patients cannot have anyone with them due to current Covid guidelines and our immunocompromised population.  

## 2021-11-30 NOTE — Progress Notes (Signed)
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 daratumumab.   #Acute Hypoxic Respiratory Failure, resolved #Lower Extremity Swelling, stable #Bilateral Lower Extremity DVTs --respiratory symptoms resolved with clear lungs and no hypoxia today after d/c from the hospital --patient agreeable to continuation of daratumumab --OK to discontinue Eliquis therapy. She had a provoked bialteral LE DVT and has completed 6 months of therapy without any residual clot. --continue Bumex per nephrology. 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    #Supportive Care --chemotherapy education complete --zofran 45m q8H PRN and compazine 114mPO q6H for nausea --acyclovir 40091mO BID for VCZ prophylaxis --albuterol for possible bronchospasm with daratumumab --Patient declines port at this time -- no pain medication required at this time.   No orders of the defined types were placed in this encounter.  All questions were answered. The patient knows to call the clinic with any problems, questions or concerns.  A total of more than 30 minutes were spent on this encounter and over half of that time was spent on counseling and coordination of care as outlined above.   JohLedell PeoplesD Department of Hematology/Oncology [place] [facility]: [phone]: [phone]: [email or web address]  11/30/2021 11:18 AM

## 2021-12-01 LAB — KAPPA/LAMBDA LIGHT CHAINS
Kappa free light chain: 11.2 mg/L (ref 3.3–19.4)
Kappa, lambda light chain ratio: 0.37 (ref 0.26–1.65)
Lambda free light chains: 30.1 mg/L — ABNORMAL HIGH (ref 5.7–26.3)

## 2021-12-02 ENCOUNTER — Other Ambulatory Visit: Payer: Self-pay | Admitting: Family Medicine

## 2021-12-04 LAB — MULTIPLE MYELOMA PANEL, SERUM
Albumin SerPl Elph-Mcnc: 2.8 g/dL — ABNORMAL LOW (ref 2.9–4.4)
Albumin/Glob SerPl: 1.2 (ref 0.7–1.7)
Alpha 1: 0.3 g/dL (ref 0.0–0.4)
Alpha2 Glob SerPl Elph-Mcnc: 0.8 g/dL (ref 0.4–1.0)
B-Globulin SerPl Elph-Mcnc: 0.9 g/dL (ref 0.7–1.3)
Gamma Glob SerPl Elph-Mcnc: 0.4 g/dL (ref 0.4–1.8)
Globulin, Total: 2.4 g/dL (ref 2.2–3.9)
IgA: 66 mg/dL (ref 64–422)
IgG (Immunoglobin G), Serum: 361 mg/dL — ABNORMAL LOW (ref 586–1602)
IgM (Immunoglobulin M), Srm: 68 mg/dL (ref 26–217)
M Protein SerPl Elph-Mcnc: 0.2 g/dL — ABNORMAL HIGH
Total Protein ELP: 5.2 g/dL — ABNORMAL LOW (ref 6.0–8.5)

## 2021-12-05 NOTE — Telephone Encounter (Signed)
AWV was on 10/27/21, last filled on 04/04/21 #30g with 0 refills

## 2021-12-07 ENCOUNTER — Telehealth: Payer: Self-pay | Admitting: Family Medicine

## 2021-12-07 NOTE — Telephone Encounter (Signed)
[facility]  called in stated the need a day supply for insurance purpose for RX alclomethasone (ACLOVATE) 0.05 % ointment . Would like a call back [phone]

## 2021-12-08 NOTE — Telephone Encounter (Signed)
Spoke to [employer] at [employer] and informed her that it needed to be a 90 day supply, per Dr. Glori Bickers.

## 2021-12-22 ENCOUNTER — Other Ambulatory Visit: Payer: Self-pay

## 2021-12-22 ENCOUNTER — Ambulatory Visit (INDEPENDENT_AMBULATORY_CARE_PROVIDER_SITE_OTHER): Payer: Medicare Other | Admitting: Family Medicine

## 2021-12-22 ENCOUNTER — Encounter: Payer: Self-pay | Admitting: Family Medicine

## 2021-12-22 VITALS — BP 140/78 | HR 85 | Temp 98.4°F | Ht 62.0 in | Wt 110.1 lb

## 2021-12-22 DIAGNOSIS — I2510 Atherosclerotic heart disease of native coronary artery without angina pectoris: Secondary | ICD-10-CM | POA: Diagnosis not present

## 2021-12-22 DIAGNOSIS — C9000 Multiple myeloma not having achieved remission: Secondary | ICD-10-CM | POA: Diagnosis not present

## 2021-12-22 DIAGNOSIS — E8581 Light chain (AL) amyloidosis: Secondary | ICD-10-CM | POA: Diagnosis not present

## 2021-12-22 DIAGNOSIS — I10 Essential (primary) hypertension: Secondary | ICD-10-CM

## 2021-12-22 DIAGNOSIS — D472 Monoclonal gammopathy: Secondary | ICD-10-CM

## 2021-12-22 DIAGNOSIS — Z86718 Personal history of other venous thrombosis and embolism: Secondary | ICD-10-CM

## 2021-12-22 DIAGNOSIS — F430 Acute stress reaction: Secondary | ICD-10-CM

## 2021-12-22 NOTE — Patient Instructions (Addendum)
Eat steadily through the day  Small servings frequently  Eat protein as much as you can  Stay hydrated  Nut butters, cheeses are good   For exercise  Exercise bands are good  Look at some chair yoga programs on line   Take time out to read (for yourself) even when you feel like you do not have time    I think some one on one or group therapy/counseling would be helpful  Ask Dr Lorenso Courier about resources that are available  I will also refer you to community care to see what we can come up with also   Take a look at this list   [facility] center [address], [place] [address]  [phone]  [facility]/ Dr Lennice Sites Address: [address] [place], [place] [address] Phone: [phone]  [facility] [place] [facility] Address: [address], [place], [place] [address] Phone: [phone]  [facility] [address] [place],  [address] [phone]
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
## 2021-12-24 DIAGNOSIS — F430 Acute stress reaction: Secondary | ICD-10-CM | POA: Insufficient documentation

## 2021-12-24 NOTE — Assessment & Plan Note (Signed)
Pt with significant medical stressors (multiple myeloma and gammopathy, amyloidosis)  Difficult treatment- plans to move closer to daughter  Good support Reviewed stressors/ coping techniques/symptoms/ support sources/ tx options and side effects in detail today  Interested in counseling - some resources given/ also will ask oncology about support groups or specialized therapy Ref done to community health

## 2021-12-24 NOTE — Assessment & Plan Note (Signed)
Provoked with low mobility and cancer  Now resolved Finished course of eliquis

## 2021-12-24 NOTE — Assessment & Plan Note (Signed)
With monoclonal gammopathy/amyloidosa Treatments have been rough Nutritional challenges/low appetite Also some generalized weakness and fatigue and psychologic challenges  Interested in physical and emotional help /more support  Will place a community care referral

## 2021-12-24 NOTE — Assessment & Plan Note (Signed)
bp in fair control at this time  BP Readings from Last 1 Encounters:  12/22/21 140/78   No changes needed Most recent labs reviewed  Disc lifstyle change with low sodium diet and exercise  Plan to continue metoprolol 12.5 mg bid  bumex 0.5 mg daily

## 2021-12-25 ENCOUNTER — Other Ambulatory Visit: Payer: Self-pay | Admitting: Hematology and Oncology

## 2021-12-26 ENCOUNTER — Telehealth: Payer: Self-pay | Admitting: *Deleted

## 2021-12-26 NOTE — Chronic Care Management (AMB) (Signed)
Chronic Care Management   Note  12/26/2021 Name: Wendy [name] MRN: [id number] DOB: 08/24/1946  Wendy [name] is a 76 y.o. year old female who is a primary care patient of Tower, Wynelle Fanny, MD. I reached out to [employer]. Mick[place] by phone today in response to a referral sent by Ms. Malayjah L. Larusso's PCP.  Ms. Samet was given information about Chronic Care Management services today including:  CCM service includes personalized support from designated clinical staff supervised by her physician, including individualized plan of care and coordination with other care providers 24/7 contact phone numbers for assistance for urgent and routine care needs. Service will only be billed when office clinical staff spend 20 minutes or more in a month to coordinate care. Only one practitioner may furnish and bill the service in a calendar month. The patient may stop CCM services at any time (effective at the end of the month) by phone call to the office staff. The patient is responsible for co-pay (up to 20% after annual deductible is met) if co-pay is required by the individual health plan.   Patient agreed to services and verbal consent obtained.   Follow up plan: Telephone appointment with care management team member scheduled for: 01/15/2022 and 02/09/2022  Julian Hy, [place] Management  Direct Dial: [phone]

## 2021-12-28 ENCOUNTER — Inpatient Hospital Stay (HOSPITAL_BASED_OUTPATIENT_CLINIC_OR_DEPARTMENT_OTHER): Payer: Medicare Other | Admitting: Hematology and Oncology

## 2021-12-28 ENCOUNTER — Other Ambulatory Visit: Payer: Self-pay

## 2021-12-28 ENCOUNTER — Inpatient Hospital Stay: Payer: Medicare Other | Attending: Hematology and Oncology

## 2021-12-28 ENCOUNTER — Inpatient Hospital Stay: Payer: Medicare Other

## 2021-12-28 ENCOUNTER — Encounter: Payer: Self-pay | Admitting: Hematology and Oncology

## 2021-12-28 ENCOUNTER — Ambulatory Visit: Payer: Medicare Other | Admitting: Hematology and Oncology

## 2021-12-28 VITALS — BP 142/95 | HR 78 | Temp 98.4°F | Resp 16 | Ht 62.0 in | Wt 109.9 lb

## 2021-12-28 DIAGNOSIS — Z5112 Encounter for antineoplastic immunotherapy: Secondary | ICD-10-CM | POA: Diagnosis present

## 2021-12-28 DIAGNOSIS — Z79899 Other long term (current) drug therapy: Secondary | ICD-10-CM | POA: Insufficient documentation

## 2021-12-28 DIAGNOSIS — E8581 Light chain (AL) amyloidosis: Secondary | ICD-10-CM | POA: Insufficient documentation

## 2021-12-28 DIAGNOSIS — R803 Bence Jones proteinuria: Secondary | ICD-10-CM

## 2021-12-28 LAB — CMP (CANCER CENTER ONLY)
ALT: 12 U/L (ref 0–44)
AST: 19 U/L (ref 15–41)
Albumin: 3.4 g/dL — ABNORMAL LOW (ref 3.5–5.0)
Alkaline Phosphatase: 100 U/L (ref 38–126)
Anion gap: 7 (ref 5–15)
BUN: 21 mg/dL (ref 8–23)
CO2: 26 mmol/L (ref 22–32)
Calcium: 8.8 mg/dL — ABNORMAL LOW (ref 8.9–10.3)
Chloride: 101 mmol/L (ref 98–111)
Creatinine: 0.81 mg/dL (ref 0.44–1.00)
GFR, Estimated: >60 mL/min (ref >60)
Glucose, Bld: 101 mg/dL — ABNORMAL HIGH (ref 70–99)
Potassium: 3.9 mmol/L (ref 3.5–5.1)
Sodium: 134 mmol/L — ABNORMAL LOW (ref 135–145)
Total Bilirubin: 0.5 mg/dL (ref 0.3–1.2)
Total Protein: 5.8 g/dL — ABNORMAL LOW (ref 6.5–8.1)

## 2021-12-28 LAB — CBC WITH DIFFERENTIAL (CANCER CENTER ONLY)
Abs Immature Granulocytes: 0.02 10*3/uL (ref 0.00–0.07)
Basophils Absolute: 0 10*3/uL (ref 0.0–0.1)
Basophils Relative: 1 %
Eosinophils Absolute: 0.1 10*3/uL (ref 0.0–0.5)
Eosinophils Relative: 1 %
HCT: 35.5 % — ABNORMAL LOW (ref 36.0–46.0)
Hemoglobin: 12.1 g/dL (ref 12.0–15.0)
Immature Granulocytes: 0 %
Lymphocytes Relative: 10 %
Lymphs Abs: 0.6 10*3/uL — ABNORMAL LOW (ref 0.7–4.0)
MCH: 31.3 pg (ref 26.0–34.0)
MCHC: 34.1 g/dL (ref 30.0–36.0)
MCV: 91.7 fL (ref 80.0–100.0)
Monocytes Absolute: 0.7 10*3/uL (ref 0.1–1.0)
Monocytes Relative: 10 %
Neutro Abs: 5.1 10*3/uL (ref 1.7–7.7)
Neutrophils Relative %: 78 %
Platelet Count: 322 10*3/uL (ref 150–400)
RBC: 3.87 MIL/uL (ref 3.87–5.11)
RDW: 15.5 % (ref 11.5–15.5)
WBC Count: 6.5 10*3/uL (ref 4.0–10.5)
nRBC: 0 % (ref 0.0–0.2)

## 2021-12-28 LAB — LACTATE DEHYDROGENASE: LDH: 253 U/L — ABNORMAL HIGH (ref 98–192)

## 2021-12-28 MED ORDER — ACETAMINOPHEN 325 MG PO TABS
650.0000 mg | ORAL_TABLET | Freq: Once | ORAL | Status: AC
  Administered 2021-12-28: 650 mg via ORAL
  Filled 2021-12-28: qty 2

## 2021-12-28 MED ORDER — DEXAMETHASONE 4 MG PO TABS
10.0000 mg | ORAL_TABLET | Freq: Once | ORAL | Status: AC
  Administered 2021-12-28: 10 mg via ORAL
  Filled 2021-12-28: qty 3

## 2021-12-28 MED ORDER — DARATUMUMAB-HYALURONIDASE-FIHJ 1800-30000 MG-UT/15ML ~~LOC~~ SOLN
1800.0000 mg | Freq: Once | SUBCUTANEOUS | Status: AC
  Administered 2021-12-28: 1800 mg via SUBCUTANEOUS
  Filled 2021-12-28: qty 15
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
## 2021-12-28 NOTE — Patient Instructions (Signed)
[facility] MEDICAL ONCOLOGY   Discharge Instructions: Thank you for choosing [facility] to provide your oncology and hematology care.   If you have a lab appointment with the Cancer Center, please go directly to the Cancer Center and check in at the registration area.   Wear comfortable clothing and clothing appropriate for easy access to any Portacath or PICC line.   We strive to give you quality time with your provider. You may need to reschedule your appointment if you arrive late (15 or more minutes).  Arriving late affects you and other patients whose appointments are after yours.  Also, if you miss three or more appointments without notifying the office, you may be dismissed from the clinic at the provider's discretion.      For prescription refill requests, have your pharmacy contact our office and allow 72 hours for refills to be completed.    Today you received the following chemotherapy and/or immunotherapy agents: daratumumab      To help prevent nausea and vomiting after your treatment, we encourage you to take your nausea medication as directed.  BELOW ARE SYMPTOMS THAT SHOULD BE REPORTED IMMEDIATELY: *FEVER GREATER THAN 100.4 F (38 C) OR HIGHER *CHILLS OR SWEATING *NAUSEA AND VOMITING THAT IS NOT CONTROLLED WITH YOUR NAUSEA MEDICATION *UNUSUAL SHORTNESS OF BREATH *UNUSUAL BRUISING OR BLEEDING *URINARY PROBLEMS (pain or burning when urinating, or frequent urination) *BOWEL PROBLEMS (unusual diarrhea, constipation, pain near the anus) TENDERNESS IN MOUTH AND THROAT WITH OR WITHOUT PRESENCE OF ULCERS (sore throat, sores in mouth, or a toothache) UNUSUAL RASH, SWELLING OR PAIN  UNUSUAL VAGINAL DISCHARGE OR ITCHING   Items with * indicate a potential emergency and should be followed up as soon as possible or go to the Emergency Department if any problems should occur.  Please show the CHEMOTHERAPY ALERT CARD or IMMUNOTHERAPY ALERT CARD at check-in  to the Emergency Department and triage nurse.  Should you have questions after your visit or need to cancel or reschedule your appointment, please contact [facility]ICAL [facility]COL[facility]  Dept: [phone]  and follow the prompts.  Office hours are 8:00 a.m. to 4:30 p.m. Monday - Friday. Please note that voicemails left after 4:00 p.m. may not be returned until the following business day.  We are closed weekends and major holidays. You have access to a nurse at all times for urgent questions. Please call the main number to the clinic Dept: [phone] and follow the prompts.   For any non-urgent questions, you may also contact your provider using MyChart. We now offer e-Visits for anyone 18 and older to request care online for non-urgent symptoms. For details visit [email or web address].   Also download the MyChart app! Go to the app store, search "MyChart", open the app, select [facility], and log in with your MyChart username and password.  Due to Covid, a mask is required upon entering the hospital/clinic. If you do not have a mask, one will be given to you upon arrival. For doctor visits, patients may have 1 support person aged 18 or older with them. For treatment visits, patients cannot have anyone with them due to current Covid guidelines and our immunocompromised population.   

## 2021-12-29 LAB — KAPPA/LAMBDA LIGHT CHAINS
Kappa free light chain: 10.2 mg/L (ref 3.3–19.4)
Kappa, lambda light chain ratio: 0.39 (ref 0.26–1.65)
Lambda free light chains: 26.2 mg/L (ref 5.7–26.3)

## 2022-01-01 LAB — MULTIPLE MYELOMA PANEL, SERUM
Albumin SerPl Elph-Mcnc: 2.9 g/dL (ref 2.9–4.4)
Albumin/Glob SerPl: 1.3 (ref 0.7–1.7)
Alpha 1: 0.3 g/dL (ref 0.0–0.4)
Alpha2 Glob SerPl Elph-Mcnc: 0.8 g/dL (ref 0.4–1.0)
B-Globulin SerPl Elph-Mcnc: 0.8 g/dL (ref 0.7–1.3)
Gamma Glob SerPl Elph-Mcnc: 0.3 g/dL — ABNORMAL LOW (ref 0.4–1.8)
Globulin, Total: 2.3 g/dL (ref 2.2–3.9)
IgA: 54 mg/dL — ABNORMAL LOW (ref 64–422)
IgG (Immunoglobin G), Serum: 321 mg/dL — ABNORMAL LOW (ref 586–1602)
IgM (Immunoglobulin M), Srm: 45 mg/dL (ref 26–217)
M Protein SerPl Elph-Mcnc: 0.1 g/dL — ABNORMAL HIGH
Total Protein ELP: 5.2 g/dL — ABNORMAL LOW (ref 6.0–8.5)

## 2022-01-04 ENCOUNTER — Other Ambulatory Visit: Payer: Self-pay

## 2022-01-04 DIAGNOSIS — E8581 Light chain (AL) amyloidosis: Secondary | ICD-10-CM

## 2022-01-04 DIAGNOSIS — Z5112 Encounter for antineoplastic immunotherapy: Secondary | ICD-10-CM | POA: Diagnosis not present

## 2022-01-08 LAB — UPEP/UIFE/LIGHT CHAINS/TP, 24-HR UR
% BETA, Urine: 10.9 %
ALPHA 1 URINE: 5.9 %
Albumin, U: 73.2 %
Alpha 2, Urine: 8.1 %
Free Kappa Lt Chains,Ur: 11.09 mg/L (ref 1.17–86.46)
Free Kappa/Lambda Ratio: 0.76 — ABNORMAL LOW (ref 1.83–14.26)
Free Lambda Lt Chains,Ur: 14.65 mg/L (ref 0.27–15.21)
GAMMA GLOBULIN URINE: 1.8 %
Total Protein, Urine-Ur/day: 2455 mg/24 hr — ABNORMAL HIGH (ref 30–150)
Total Protein, Urine: 245.5 mg/dL
Total Volume: 1000

## 2022-01-15 ENCOUNTER — Other Ambulatory Visit: Payer: Self-pay | Admitting: Hematology and Oncology

## 2022-01-15 ENCOUNTER — Ambulatory Visit (INDEPENDENT_AMBULATORY_CARE_PROVIDER_SITE_OTHER): Payer: Medicare Other | Admitting: *Deleted

## 2022-01-15 DIAGNOSIS — Q782 Osteopetrosis: Secondary | ICD-10-CM

## 2022-01-15 DIAGNOSIS — C9000 Multiple myeloma not having achieved remission: Secondary | ICD-10-CM

## 2022-01-15 DIAGNOSIS — I10 Essential (primary) hypertension: Secondary | ICD-10-CM

## 2022-01-15 DIAGNOSIS — F430 Acute stress reaction: Secondary | ICD-10-CM

## 2022-01-15 NOTE — Patient Instructions (Signed)
Visit Information   Thank you for taking time to visit with me today. Please don't hesitate to contact me if I can be of assistance to you before our next scheduled telephone appointment.  Following are the goals we discussed today:  (Copy and paste patient goals from clinical care plan here)  Our next appointment is by telephone on 02/19/22 at 11am  Please call the care guide team at [phone] if you need to cancel or reschedule your appointment.   If you are experiencing a Mental Health or [place] or need someone to talk to, please call the [place] National Suicide Prevention Lifeline: 5791659139 or TTY: [phone] TTY [phone]) to talk to a trained counselor go to [facility] [address], [place] [phone])   Following is a copy of your full care plan:  Care Plan : LCSW Plan of Care  Updates made by Deirdre Peer, LCSW since 01/15/2022 12:00 AM     Problem: Impaired quality of life due to isolation, loneliness and health issues   Priority: High     Long-Range Goal: Improve quality of life through support, resources and activity   Start Date: 01/15/2022  Expected End Date: 04/07/2022  This Visit's Progress: On track  Priority: High  Note:   Current barriers:    Limited social support, Social Isolation, Lacks knowledge of community resources, coping with current medical conditions and Overall impaired quality of life Clinical Goals: Patient will work with CSW and Care Guide to address needs related to improving pt's overall quality of life Clinical Interventions:  CSW made contact with pt today in regards to referral for mental health/support. Pt reports no history of depression; does take Xanax for Anxiety/sleep. She does admit to feeling "isolated", lonely and bored. She also is facing symptoms of Seasonal Affective Disorder with the winter/weather limiting her outings even more. Pt is dealing with a  multitude of medical issues- recent UTI, eye infection and a root canal- "since November" feeling more withdrawn/alone and limited. Pt denies SI/HI. Completed depression screening with pt; scoring low ("1") for depression.  She is connecting with a local Counselor her daughter has connected her with; Burnetta Sabin, MSW, LCSW, and is awaiting an appointment with her.  Pt would like to be more active and hopes to get reconnected with activitiies; such as [employer], senior activities, socializing, etc.  Wants to wait until her dental surgery and next cancer treatment this month. Will follow up with her next month to further discuss.    Depression screen [facility] 2/9 01/15/2022 01/15/2022 11/21/2021 10/27/2021 10/19/2020  Decreased Interest 1 1 0 0 0  Down, Depressed, Hopeless 0 0 0 0 0  PHQ - 2 Score 1 1 0 0 0  Some recent data might be hidden    Assessment of needs, barriers, agencies contacted, as well as how it is impacting and influencing  Depression screen reviewed  PHQ2/ PHQ9 completed Solution-Focused Strategies employed:  Mindfulness or Relaxation training provided Active listening / Reflection utilized  Emotional Support Provided Provided brief CBT  Participation in counseling encouraged  Discussed self-care action plan:   Review various resources, discussed options and provided patient information about  Referral to care guide Psychologist, prison and probation services resources- senior center.activities, silver sneakers, etc)  1:1 collaboration with primary care provider regarding development and update of comprehensive plan of care as evidenced by provider attestation and co-signature Inter-disciplinary care team collaboration (see longitudinal plan of care) Patient Goals/Self-Care Activities: Over the next 45 days  Call Burnetta Sabin to schedule your counseling appointment. Continue with your self-care action plan (finding activities and resources to enhance your quality of life) I have placed a referral to the  community resource care guide they will call you with resources        Consent to CCM Services: Ms. Kendrix was given information about Chronic Care Management services including:  CCM service includes personalized support from designated clinical staff supervised by her physician, including individualized plan of care and coordination with other care providers 24/7 contact phone numbers for assistance for urgent and routine care needs. Service will only be billed when office clinical staff spend 20 minutes or more in a month to coordinate care. Only one practitioner may furnish and bill the service in a calendar month. The patient may stop CCM services at any time (effective at the end of the month) by phone call to the office staff. The patient will be responsible for cost sharing (co-pay) of up to 20% of the service fee (after annual deductible is met).  Patient agreed to services and verbal consent obtained.   Patient verbalizes understanding of instructions and care plan provided today and agrees to view in [place]. Active MyChart status confirmed with patient.    Telephone follow up appointment with care management team member scheduled for:

## 2022-01-15 NOTE — Chronic Care Management (AMB) (Signed)
Chronic Care Management    Clinical Social Work Note  01/15/2022 Name: Wendy [name] MRN: [id number] DOB: [birth year]  Wendy [name] is a 76 y.o. year old female who is a primary care patient of Tower, Wynelle Fanny, MD. The CCM team was consulted to assist the patient with chronic disease management and/or care coordination needs related to: [employer]  and Wendy [name] and Resources.   Engaged with patient by telephone for initial visit in response to provider referral for social work chronic care management and care coordination services.   Consent to Services:  The patient was given information about Chronic Care Management services, agreed to services, and gave verbal consent prior to initiation of services.  Please see initial visit note for detailed documentation.   Patient agreed to services and consent obtained.   Assessment: Review of patient past medical history, allergies, medications, and health status, including review of relevant consultants reports was performed today as part of a comprehensive evaluation and provision of chronic care management and care coordination services.     SDOH (Social Determinants of Health) assessments and interventions performed:    Advanced Directives Status: See Care Plan for related entries.  CCM Care Plan  Allergies  Allergen Reactions   Bentyl [Dicyclomine Hcl] Other (See Comments)    Groggy, blurred vision   Butalbital-Aspirin-Caffeine Other (See Comments)    hallucinations   Clonidine Derivatives Other (See Comments)    dizziness, lightheadedness, abdominal cramping, dry mouth/throat   Linzess [Linaclotide] Diarrhea   Morphine And Related Other (See Comments)    Does not work   Motrin [Ibuprofen] Other (See Comments)    GI upset   Penicillins Other (See Comments)    As child; reaction unknown Has patient had a PCN reaction causing immediate rash, facial/tongue/throat swelling, SOB or lightheadedness with  hypotension: No Has patient had a PCN reaction causing severe rash involving mucus membranes or skin necrosis: No Has patient had a PCN reaction that required hospitalization: No Has patient had a PCN reaction occurring within the last 10 years: No If all of the above answers are "NO", then may proceed with Cephalosporin use.   Sulfa Antibiotics Other (See Comments)    In childhood   Zanaflex [Tizanidine Hcl] Other (See Comments)    Decreased BP   Diphenhydramine Hcl Palpitations    restlessness    Outpatient Encounter Medications as of 01/15/2022  Medication Sig   ALPRAZolam (XANAX) 0.5 MG tablet TAKE 1/2 TABLETS (0.25 MG TOTAL) BY MOUTH DAILY AS NEEDED FOR ANXIETY OR SLEEP.   acetaminophen (TYLENOL) 325 MG tablet Take 2 tablets (650 mg total) by mouth every 6 (six) hours as needed for mild pain, fever or headache.   albuterol (VENTOLIN HFA) 108 (90 Base) MCG/ACT inhaler Inhale 2 puffs by mouth into the lungs every 6 hours as needed for wheezing or shortness of breath   alclomethasone (ACLOVATE) 0.05 % ointment apply topically to the lips once daily as needed   bumetanide (BUMEX) 0.5 MG tablet Take 1 tablet (0.5 mg total) by mouth daily for 7 days. Takes an additional tablet if needed   Calcium Carbonate Antacid (TUMS PO) Take 2-4 capsules by mouth daily as needed (heartburn).   Cholecalciferol (VITAMIN D) 2000 UNITS tablet Take 2,000 Units by mouth daily.   denosumab (PROLIA) 60 MG/ML SOSY injection Inject 60 mg into the skin every 6 (six) months.   diphenoxylate-atropine (LOMOTIL) 2.5-0.025 MG tablet Take 1 tablet by mouth 4 (four) times daily as needed  for diarrhea or loose stools.   esomeprazole (NEXIUM) 40 MG capsule Take 1 capsule (40 mg total) by mouth daily.   Melatonin 10 MG TABS Take 10 mg by mouth at bedtime.   metoprolol tartrate (LOPRESSOR) 25 MG tablet TAKE 1/2 TABLET (12.5 MG TOTAL) BY MOUTH TWO TIMES DAILY.   potassium chloride SA (KLOR-CON) 20 MEQ tablet Take 1 tablet (20  mEq total) by mouth 2 (two) times daily.   Propylene Glycol (SYSTANE BALANCE OP) Place 1 drop into both eyes 2 (two) times daily.   rosuvastatin (CRESTOR) 10 MG tablet Take 1 tablet (10 mg total) by mouth daily.   traMADol (ULTRAM) 50 MG tablet TAKE 1 TO 2 TABLETS(50 TO 100 MG) BY MOUTH EVERY 6 HOURS AS NEEDED   zolpidem (AMBIEN) 10 MG tablet TAKE ONE TABLET BY MOUTH AT BEDTIME AS NEEDED FOR SLEEP   [DISCONTINUED] acyclovir (ZOVIRAX) 400 MG tablet Take 1 tablet (400 mg total) by mouth 2 (two) times daily.   No facility-administered encounter medications on file as of 01/15/2022.    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   Situational anxiety 01/11/2021   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   Hyperlipidemia 10/19/2020   Hearing loss 06/14/2020   Pedal edema 06/01/2020   Aortic atherosclerosis ([place]) 04/06/2020   CAD (coronary artery disease) 04/06/2020   H/O compression fracture of spine 04/06/2020   Chronic back pain 04/06/2020   Abnormal CT scan of lung 03/17/2020   Heartburn 06/02/2018   Chronic constipation 12/31/2017   Blood glucose elevated 09/25/2017   History of ileus 06/07/2017   Right carpal tunnel syndrome 12/07/2016   Estrogen deficiency 09/24/2016   Routine general medical examination at a health care facility 09/04/2015   Colon cancer screening 12/11/2014   Encounter for Medicare annual wellness exam 05/17/2013   Osteoarthritis 03/28/2011   Degenerative disc disease, lumbar 03/28/2011   Hyponatremia 02/12/2011   Essential hypertension  08/01/2010   Osteoporosis 08/01/2010    Conditions to be addressed/monitored: HTN; Mental Health Concerns   Care Plan : LCSW Plan of Care  Updates made by Deirdre Peer, LCSW since 01/15/2022 12:00 AM     Problem: Impaired quality of life due to isolation, loneliness and health issues   Priority: High     Long-Range Goal: Improve quality of life through support, resources and activity   Start Date: 01/15/2022  Expected End Date: 04/07/2022  This Visit's Progress: On track  Priority: High  Note:   Current barriers:    Limited social support, Social Isolation, Lacks knowledge of community resources, coping with current medical conditions and Overall impaired quality of life Clinical Goals: Patient will work with CSW and Care Guide to address needs related to improving pt's overall quality of life Clinical Interventions:  CSW made contact with pt today in regards to referral for mental health/support. Pt reports no history of depression; does take Xanax for Anxiety/sleep. She does admit to feeling "isolated", lonely and bored. She also is facing symptoms of Seasonal Affective Disorder with the winter/weather limiting her outings even more. Pt is  dealing with a multitude of medical issues- recent UTI, eye infection and a root canal- "since November" feeling more withdrawn/alone and limited. Pt denies SI/HI. Completed depression screening with pt; scoring low ("1") for depression.  She is connecting with a local Counselor her daughter has connected her with; Burnetta Sabin, MSW, LCSW, and is awaiting an appointment with her.  Pt would like to be more active and hopes to get reconnected with activitiies; such as [employer], senior activities, socializing, etc.  Wants to wait until her dental surgery and next cancer treatment this month. Will follow up with her next month to further discuss.    Depression screen [facility] 2/9 01/15/2022 01/15/2022 11/21/2021 10/27/2021 10/19/2020  Decreased Interest 1  1 0 0 0  Down, Depressed, Hopeless 0 0 0 0 0  PHQ - 2 Score 1 1 0 0 0  Some recent data might be hidden    Assessment of needs, barriers, agencies contacted, as well as how it is impacting and influencing  Depression screen reviewed  PHQ2/ PHQ9 completed Solution-Focused Strategies employed:  Mindfulness or Relaxation training provided Active listening / Reflection utilized  Emotional Support Provided Provided brief CBT  Participation in counseling encouraged  Discussed self-care action plan:   Review various resources, discussed options and provided patient information about  Referral to care guide Psychologist, prison and probation services resources- senior center.activities, silver sneakers, etc)  1:1 collaboration with primary care provider regarding development and update of comprehensive plan of care as evidenced by provider attestation and co-signature Inter-disciplinary care team collaboration (see longitudinal plan of care) Patient Goals/Self-Care Activities: Over the next 45 days Call Burnetta Sabin to schedule your counseling appointment. Continue with your self-care action plan (finding activities and resources to enhance your quality of life) I have placed a referral to the community resource care guide they will call you with resources         Follow Up Plan: Appointment scheduled for SW follow up with client by phone on: 02/19/22 Eduard Clos MSW, [place] Licensed Clinical Social Worker [place]   [phone]

## 2022-01-25 ENCOUNTER — Other Ambulatory Visit: Payer: Medicare Other

## 2022-01-25 ENCOUNTER — Other Ambulatory Visit: Payer: Self-pay

## 2022-01-25 ENCOUNTER — Ambulatory Visit: Payer: Medicare Other

## 2022-01-25 ENCOUNTER — Inpatient Hospital Stay: Payer: Medicare Other

## 2022-01-25 ENCOUNTER — Inpatient Hospital Stay: Payer: Medicare Other | Attending: Hematology and Oncology | Admitting: Hematology and Oncology

## 2022-01-25 ENCOUNTER — Ambulatory Visit: Payer: Medicare Other | Admitting: Hematology and Oncology

## 2022-01-25 VITALS — BP 165/81 | HR 98 | Temp 98.1°F | Resp 17 | Ht 62.0 in | Wt 106.3 lb

## 2022-01-25 DIAGNOSIS — E8581 Light chain (AL) amyloidosis: Secondary | ICD-10-CM

## 2022-01-25 DIAGNOSIS — Z79899 Other long term (current) drug therapy: Secondary | ICD-10-CM | POA: Insufficient documentation

## 2022-01-25 DIAGNOSIS — R803 Bence Jones proteinuria: Secondary | ICD-10-CM | POA: Diagnosis not present

## 2022-01-25 DIAGNOSIS — Z5112 Encounter for antineoplastic immunotherapy: Secondary | ICD-10-CM | POA: Insufficient documentation

## 2022-01-25 LAB — CBC WITH DIFFERENTIAL (CANCER CENTER ONLY)
Abs Immature Granulocytes: 0.02 10*3/uL (ref 0.00–0.07)
Basophils Absolute: 0.1 10*3/uL (ref 0.0–0.1)
Basophils Relative: 1 %
Eosinophils Absolute: 0.1 10*3/uL (ref 0.0–0.5)
Eosinophils Relative: 1 %
HCT: 36.7 % (ref 36.0–46.0)
Hemoglobin: 12.6 g/dL (ref 12.0–15.0)
Immature Granulocytes: 0 %
Lymphocytes Relative: 9 %
Lymphs Abs: 0.7 10*3/uL (ref 0.7–4.0)
MCH: 31.8 pg (ref 26.0–34.0)
MCHC: 34.3 g/dL (ref 30.0–36.0)
MCV: 92.7 fL (ref 80.0–100.0)
Monocytes Absolute: 0.5 10*3/uL (ref 0.1–1.0)
Monocytes Relative: 7 %
Neutro Abs: 6.7 10*3/uL (ref 1.7–7.7)
Neutrophils Relative %: 82 %
Platelet Count: 432 10*3/uL — ABNORMAL HIGH (ref 150–400)
RBC: 3.96 MIL/uL (ref 3.87–5.11)
RDW: 15.1 % (ref 11.5–15.5)
WBC Count: 8 10*3/uL (ref 4.0–10.5)
nRBC: 0 % (ref 0.0–0.2)

## 2022-01-25 LAB — CMP (CANCER CENTER ONLY)
ALT: 11 U/L (ref 0–44)
AST: 18 U/L (ref 15–41)
Albumin: 3.5 g/dL (ref 3.5–5.0)
Alkaline Phosphatase: 126 U/L (ref 38–126)
Anion gap: 8 (ref 5–15)
BUN: 26 mg/dL — ABNORMAL HIGH (ref 8–23)
CO2: 26 mmol/L (ref 22–32)
Calcium: 8.8 mg/dL — ABNORMAL LOW (ref 8.9–10.3)
Chloride: 97 mmol/L — ABNORMAL LOW (ref 98–111)
Creatinine: 0.76 mg/dL (ref 0.44–1.00)
GFR, Estimated: >60 mL/min (ref >60)
Glucose, Bld: 107 mg/dL — ABNORMAL HIGH (ref 70–99)
Potassium: 3.8 mmol/L (ref 3.5–5.1)
Sodium: 131 mmol/L — ABNORMAL LOW (ref 135–145)
Total Bilirubin: 0.5 mg/dL (ref 0.3–1.2)
Total Protein: 6.1 g/dL — ABNORMAL LOW (ref 6.5–8.1)

## 2022-01-25 LAB — LACTATE DEHYDROGENASE: LDH: 256 U/L — ABNORMAL HIGH (ref 98–192)

## 2022-01-25 MED ORDER — DARATUMUMAB-HYALURONIDASE-FIHJ 1800-30000 MG-UT/15ML ~~LOC~~ SOLN
1800.0000 mg | Freq: Once | SUBCUTANEOUS | Status: AC
  Administered 2022-01-25: 1800 mg via SUBCUTANEOUS
  Filled 2022-01-25: qty 15

## 2022-01-25 MED ORDER — DEXAMETHASONE 4 MG PO TABS
10.0000 mg | ORAL_TABLET | Freq: Once | ORAL | Status: AC
  Administered 2022-01-25: 10 mg via ORAL
  Filled 2022-01-25: qty 3

## 2022-01-25 MED ORDER — ACETAMINOPHEN 325 MG PO TABS
650.0000 mg | ORAL_TABLET | Freq: Once | ORAL | Status: AC
  Administered 2022-01-25: 650 mg via ORAL
  Filled 2022-01-25: qty 2
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
## 2022-01-25 NOTE — Patient Instructions (Signed)
[facility]  Discharge Instructions: Thank you for choosing [facility] to provide your oncology and hematology care.   If you have a lab appointment with the [facility], please go directly to the [facility] and check in at the registration area.   Wear comfortable clothing and clothing appropriate for easy access to any Portacath or PICC line.   We strive to give you quality time with your provider. You may need to reschedule your appointment if you arrive late (15 or more minutes).  Arriving late affects you and other patients whose appointments are after yours.  Also, if you miss three or more appointments without notifying the office, you may be dismissed from the clinic at the providers discretion.      For prescription refill requests, have your pharmacy contact our office and allow 72 hours for refills to be completed.    Today you received the following chemotherapy and/or immunotherapy agent: Daratumumab (Darzalex Faspro)   To help prevent nausea and vomiting after your treatment, we encourage you to take your nausea medication as directed.  BELOW ARE SYMPTOMS THAT SHOULD BE REPORTED IMMEDIATELY: *FEVER GREATER THAN 100.4 F (38 C) OR HIGHER *CHILLS OR SWEATING *NAUSEA AND VOMITING THAT IS NOT CONTROLLED WITH YOUR NAUSEA MEDICATION *UNUSUAL SHORTNESS OF BREATH *UNUSUAL BRUISING OR BLEEDING *URINARY PROBLEMS (pain or burning when urinating, or frequent urination) *BOWEL PROBLEMS (unusual diarrhea, constipation, pain near the anus) TENDERNESS IN MOUTH AND THROAT WITH OR WITHOUT PRESENCE OF ULCERS (sore throat, sores in mouth, or a toothache) UNUSUAL RASH, SWELLING OR PAIN  UNUSUAL VAGINAL DISCHARGE OR ITCHING   Items with * indicate a potential emergency and should be followed up as soon as possible or go to the Emergency Department if any problems should occur.  Please show the CHEMOTHERAPY ALERT CARD or IMMUNOTHERAPY ALERT CARD  at check-in to the Emergency Department and triage nurse.  Should you have questions after your visit or need to cancel or reschedule your appointment, please contact [facility]  Dept: [phone]  and follow the prompts.  Office hours are 8:00 a.m. to 4:30 p.m. Monday - Friday. Please note that voicemails left after 4:00 p.m. may not be returned until the following business day.  We are closed weekends and major holidays. You have access to a nurse at all times for urgent questions. Please call the main number to the clinic Dept: [phone] and follow the prompts.   For any non-urgent questions, you may also contact your provider using MyChart. We now offer e-Visits for anyone 63 and older to request care online for non-urgent symptoms. For details visit [email or web address].   Also download the MyChart app! Go to the app store, search "MyChart", open the app, select [facility], and log in with your MyChart username and password.  Due to Covid, a mask is required upon entering the hospital/clinic. If you do not have a mask, one will be given to you upon arrival. For doctor visits, patients may have 1 support person aged 64 or older with them. For treatment visits, patients cannot have anyone with them due to current Covid guidelines and our immunocompromised population.

## 2022-01-26 LAB — KAPPA/LAMBDA LIGHT CHAINS
Kappa free light chain: 9.6 mg/L (ref 3.3–19.4)
Kappa, lambda light chain ratio: 0.31 (ref 0.26–1.65)
Lambda free light chains: 31 mg/L — ABNORMAL HIGH (ref 5.7–26.3)

## 2022-01-27 ENCOUNTER — Encounter: Payer: Self-pay | Admitting: Hematology and Oncology

## 2022-01-29 ENCOUNTER — Telehealth: Payer: Self-pay | Admitting: Hematology and Oncology

## 2022-01-29 NOTE — Telephone Encounter (Signed)
Scheduled per 2/2 los, message has been left with pt

## 2022-01-30 LAB — MULTIPLE MYELOMA PANEL, SERUM
Albumin SerPl Elph-Mcnc: 2.8 g/dL — ABNORMAL LOW (ref 2.9–4.4)
Albumin/Glob SerPl: 1.2 (ref 0.7–1.7)
Alpha 1: 0.4 g/dL (ref 0.0–0.4)
Alpha2 Glob SerPl Elph-Mcnc: 0.9 g/dL (ref 0.4–1.0)
B-Globulin SerPl Elph-Mcnc: 0.9 g/dL (ref 0.7–1.3)
Gamma Glob SerPl Elph-Mcnc: 0.3 g/dL — ABNORMAL LOW (ref 0.4–1.8)
Globulin, Total: 2.5 g/dL (ref 2.2–3.9)
IgA: 57 mg/dL — ABNORMAL LOW (ref 64–422)
IgG (Immunoglobin G), Serum: 268 mg/dL — ABNORMAL LOW (ref 586–1602)
IgM (Immunoglobulin M), Srm: 39 mg/dL (ref 26–217)
M Protein SerPl Elph-Mcnc: 0.1 g/dL — ABNORMAL HIGH
Total Protein ELP: 5.3 g/dL — ABNORMAL LOW (ref 6.0–8.5)

## 2022-02-03 ENCOUNTER — Encounter: Payer: Self-pay | Admitting: Hematology and Oncology

## 2022-02-03 ENCOUNTER — Telehealth: Payer: Medicare Other | Admitting: Nurse Practitioner

## 2022-02-03 DIAGNOSIS — R399 Unspecified symptoms and signs involving the genitourinary system: Secondary | ICD-10-CM | POA: Diagnosis not present

## 2022-02-03 MED ORDER — CEPHALEXIN 500 MG PO CAPS: 500.0000 mg | ORAL_CAPSULE | Freq: Two times a day (BID) | ORAL | 0 refills | Status: AC

## 2022-02-03 NOTE — Progress Notes (Signed)

## 2022-02-03 NOTE — Progress Notes (Signed)
I have spent 5 minutes in review of e-visit questionnaire, review and updating patient chart, medical decision making and response to patient.  ° °Reema Chick W Pretty Weltman, NP ° °  °

## 2022-02-09 ENCOUNTER — Encounter: Payer: Self-pay | Admitting: Family Medicine

## 2022-02-09 ENCOUNTER — Other Ambulatory Visit: Payer: Self-pay

## 2022-02-09 ENCOUNTER — Ambulatory Visit (INDEPENDENT_AMBULATORY_CARE_PROVIDER_SITE_OTHER): Payer: Medicare Other

## 2022-02-09 ENCOUNTER — Ambulatory Visit (INDEPENDENT_AMBULATORY_CARE_PROVIDER_SITE_OTHER)
Admission: RE | Admit: 2022-02-09 | Discharge: 2022-02-09 | Disposition: A | Discharge status: Home / Self Care | Payer: Medicare Other | Source: Ambulatory Visit | Attending: Family Medicine | Admitting: Family Medicine

## 2022-02-09 ENCOUNTER — Ambulatory Visit (INDEPENDENT_AMBULATORY_CARE_PROVIDER_SITE_OTHER): Payer: Medicare Other | Admitting: Family Medicine

## 2022-02-09 VITALS — BP 112/78 | HR 95 | Temp 96.8°F | Ht 62.0 in | Wt 109.0 lb

## 2022-02-09 DIAGNOSIS — R2689 Other abnormalities of gait and mobility: Secondary | ICD-10-CM

## 2022-02-09 DIAGNOSIS — M545 Low back pain, unspecified: Secondary | ICD-10-CM

## 2022-02-09 DIAGNOSIS — C9000 Multiple myeloma not having achieved remission: Secondary | ICD-10-CM

## 2022-02-09 DIAGNOSIS — M7918 Myalgia, other site: Secondary | ICD-10-CM | POA: Insufficient documentation

## 2022-02-09 DIAGNOSIS — Z9181 History of falling: Secondary | ICD-10-CM | POA: Diagnosis not present

## 2022-02-09 DIAGNOSIS — R531 Weakness: Secondary | ICD-10-CM | POA: Insufficient documentation

## 2022-02-09 DIAGNOSIS — I10 Essential (primary) hypertension: Secondary | ICD-10-CM

## 2022-02-09 DIAGNOSIS — E8581 Light chain (AL) amyloidosis: Secondary | ICD-10-CM

## 2022-02-09 IMAGING — DX DG PELVIS 1-2V
1 series · 1 of 1 positions shown · non-contrast
Comparison: None.

CLINICAL DATA: Recent fall with pelvic pain, initial encounter

EXAM:
PELVIS - 1 VIEW

[pelvis ap]
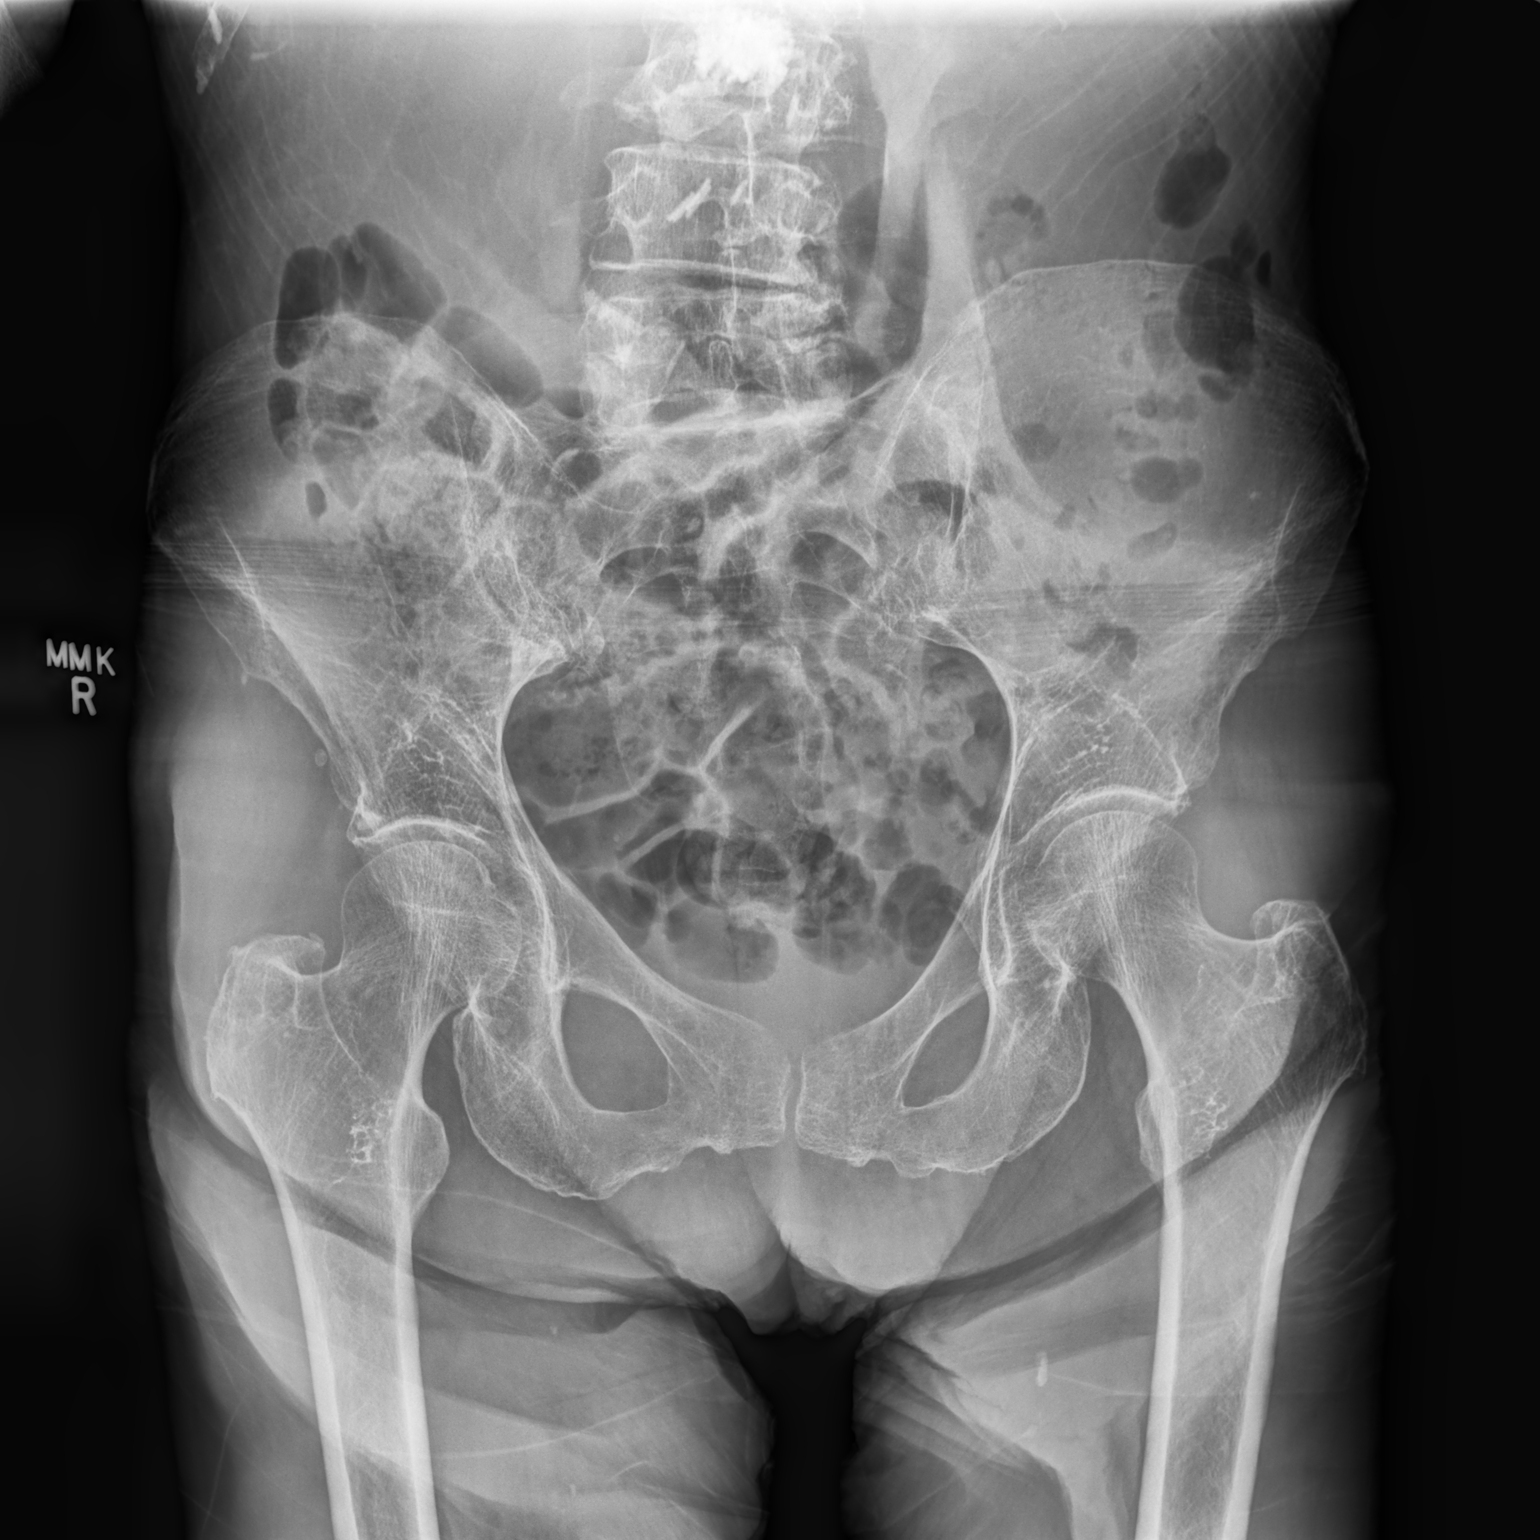

[1 of 1 positions shown; findings below may reference images not displayed]

FINDINGS: Pelvic ring is intact. No acute fracture or dislocation is noted.
Degenerative changes in the lumbar spine are noted. Soft tissue
changes are seen.
IMPRESSION: No acute abnormality noted.

## 2022-02-09 IMAGING — DX DG LUMBAR SPINE COMPLETE 4+V
5 series · 5 of 5 positions shown · non-contrast
Comparison: [DATE]

CLINICAL DATA: Low back pain following fall, initial encounter

EXAM:
LUMBAR SPINE - COMPLETE 4+ VIEW

[lumbar spine ap]
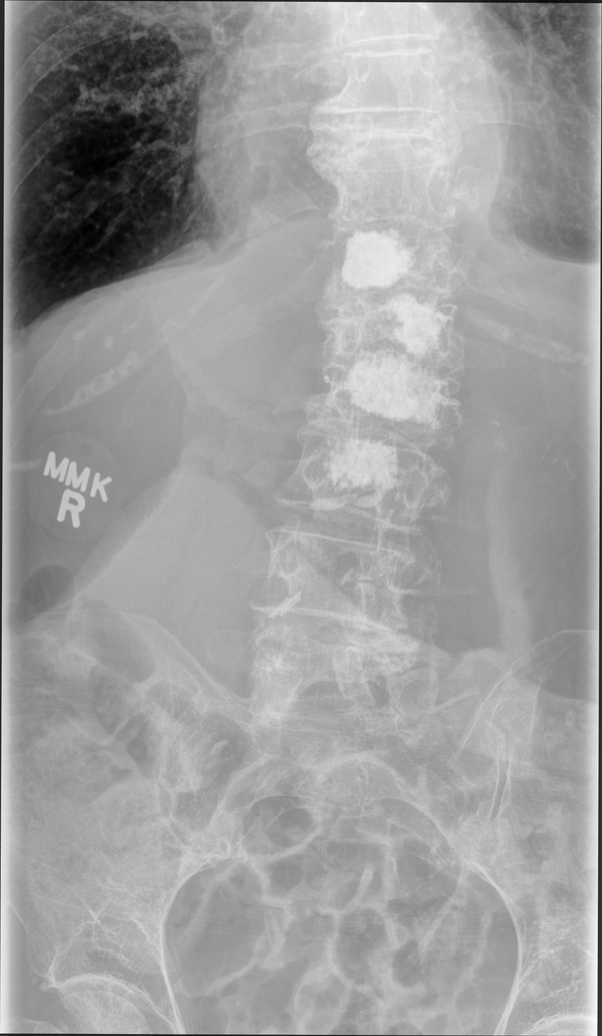

[lumbar spine mlo (1 of 2)]
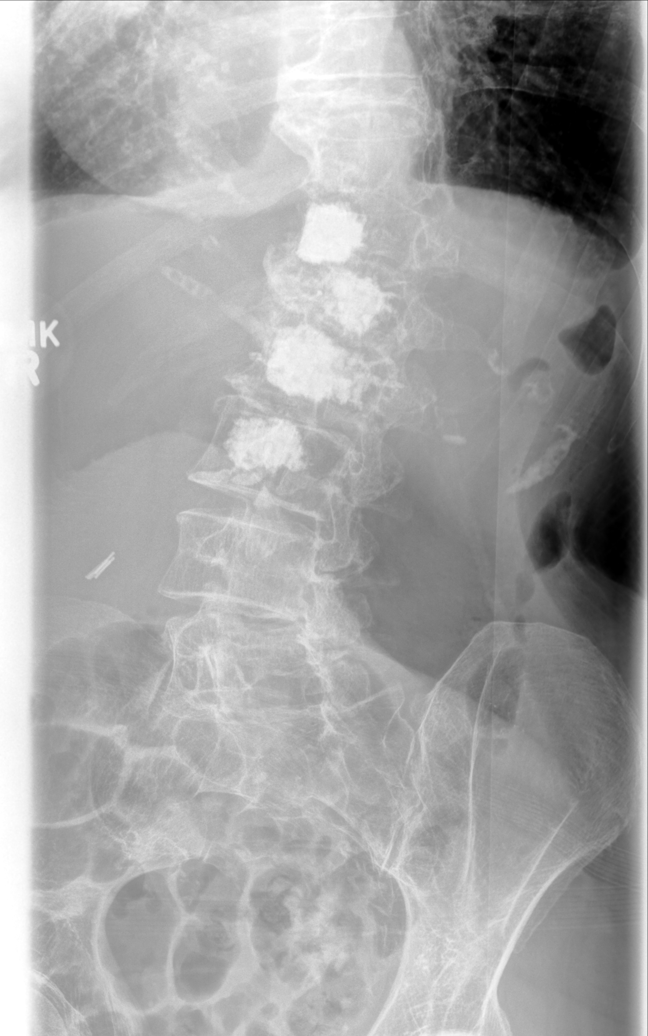

[lumbar spine mlo (2 of 2)]
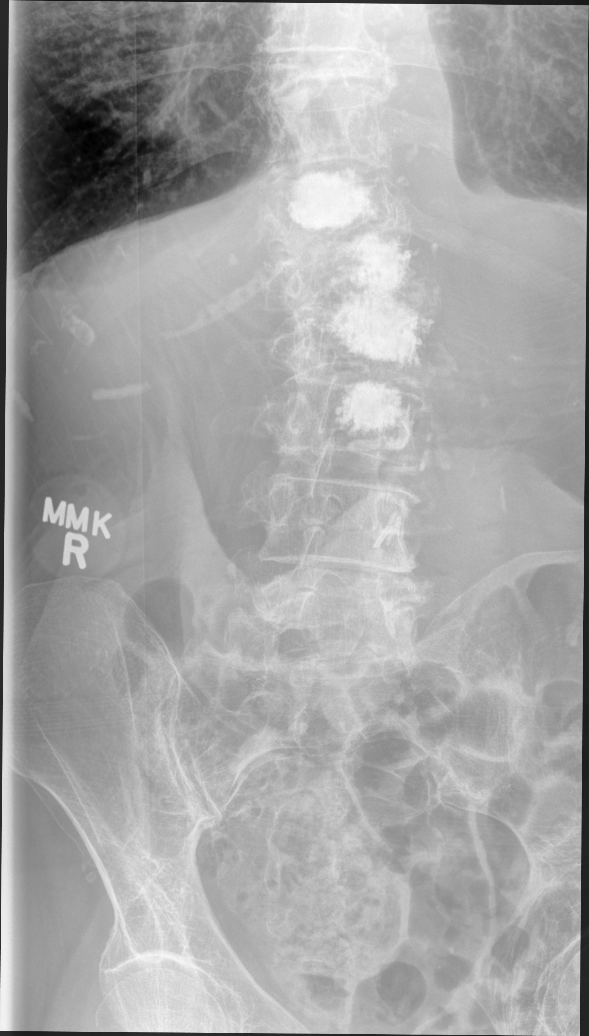

[lumbar spine lat (1 of 2)]
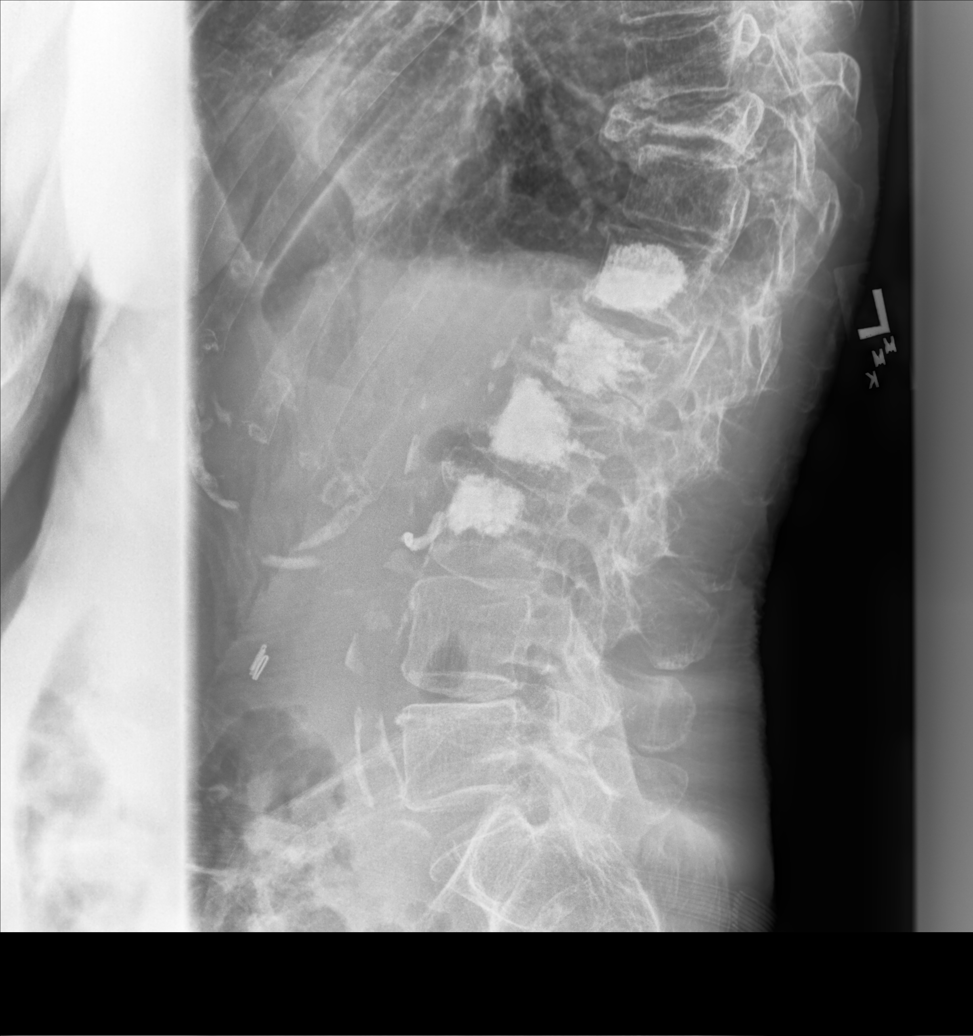

[lumbar spine lat (2 of 2)]
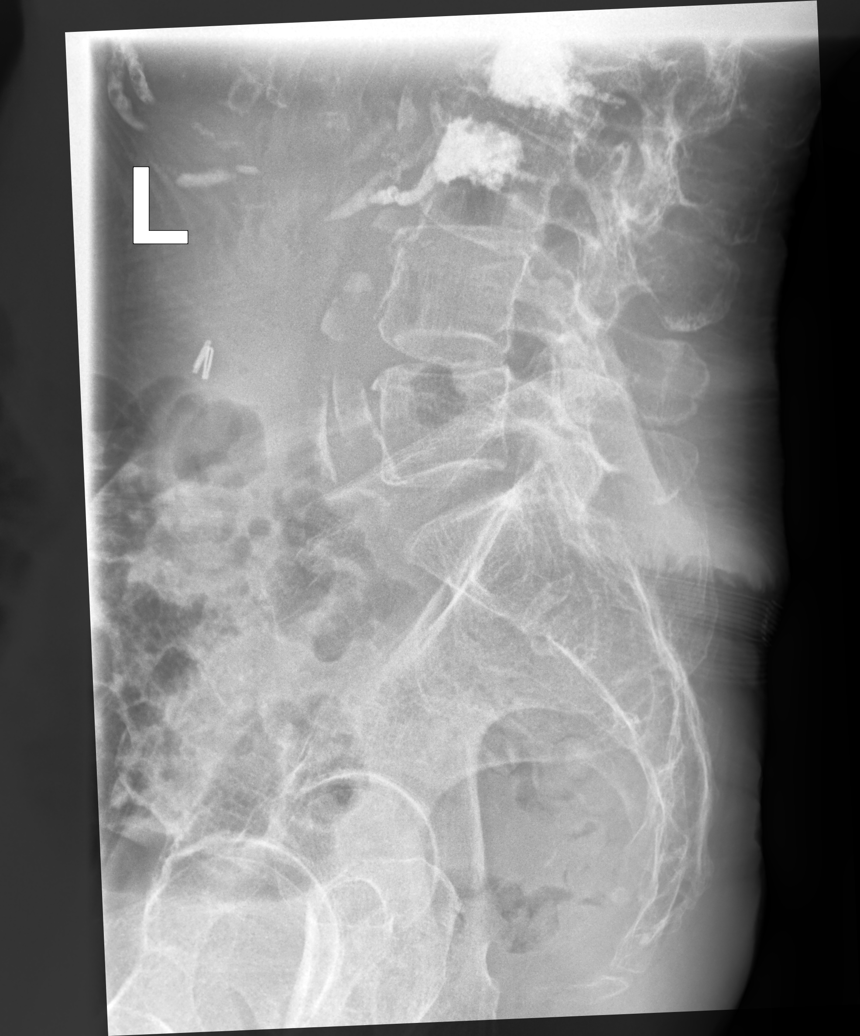

[5 of 5 positions shown; findings below may reference images not displayed]

FINDINGS: Chronic T10 vertebral plana compression deformity is noted with
increased kyphosis. Changes of prior vertebral augmentation are
noted from T12-L3. The overall appearance is stable. No compression
deformity in the lower lumbar spine is noted. Mild osteophytic
changes and facet hypertrophic changes are seen. No acute
abnormality is noted.
IMPRESSION: Chronic changes in the lumbar spine are noted stable in appearance
from prior exam.

## 2022-02-09 NOTE — Patient Instructions (Addendum)
Tylenol is ok  Voltaren gel (if ok with your kidney specialist) would be helpful  Heat for tight muscles 10 minutes at a time Ice for sore joints 10 minutes at a time   Xray today  We will contact you with xray report   I will work on home health referral for therapy and we will see how it goes

## 2022-02-09 NOTE — Patient Instructions (Signed)
Visit Information   Thank you for taking time to visit with me today. Please don't hesitate to contact me if I can be of assistance to you before our next scheduled telephone appointment.  Following are the goals we discussed today:  Take medications as prescribed   Attend all scheduled provider appointments Call pharmacy for medication refills 3-7 days in advance of running out of medications Call provider office for new concerns or questions  Eat small frequent meals during the day, manage fatigue ( take rest breaks when needed), stay hydrated Follow a low salt diet.  Review fall prevention education article sent to you in my chart Keep walkways free of clutter, remove throw rugs, keep areas in home well lit.  Use nightlight at night in room/ bathroom/ hallway Consider monitoring blood pressure at home 2-3 times per week and recording.   Our next appointment is by telephone on 03/19/22 at 1:00 pm  Please call the care guide team at [phone] if you need to cancel or reschedule your appointment.   If you are experiencing a Mental Health or [place] or need someone to talk to, please call the Suicide and Crisis Lifeline: 988 call 1-800-273-TALK (toll free, 24 hour hotline)   Following is a copy of your full care plan:  Care Plan : [facility] Plan of care  Updates made by Dannielle Karvonen, RN since 02/09/2022 12:00 AM     Problem: Chronic disease management education and care coordination needs.   Priority: High     Long-Range Goal: Development of plan of caer to address chronic disease managment and care coordination needs.   Start Date: 02/09/2022  Expected End Date: 04/20/2022  Priority: High  Note:   Current Barriers:  Knowledge Deficits related to plan of care for management of HTN and Multiple Myeloma, falls  Patient is 76 year old with history of HTN, Multiple Myeloma, fall.  Patient states initial diagnosis with multiple myeloma was approximately 1 year ago. She  states she is currently undergoing treatments.  Patient reports last visit with oncology was 01/25/22.  Patient reports sustaining a fall approximately 1 week ago without serious injury. She reports just bumps and bruises.  Patient states she uses a cane/ walker for ambulation.  She reports feeling isolated, lonely, and overwhelmed with health conditions. Patient reports having follow up with [facility] care management LCSW, Eduard Clos and states she has an appointment scheduled with counselor, Burnetta Sabin, LCSW.  Patient voiced excitement that she will be moving to [place] assisted living in a couple of months. She states this will allow her to socialize more and feels her life will improve.   RNCM Clinical Goal(s):  Patient will verbalize understanding of plan for management of HTN and Multiple Myeloma, falls as evidenced by patient report and/ or notation in chart take all medications exactly as prescribed and will call provider for medication related questions as evidenced by patient report and /or notation in chart    attend all scheduled medical appointments:  as evidenced by patient report and / or notation in chart        continue to work with RN Care Manager and/or Social Worker to address care management and care coordination needs related to HTN and Multiple Myeloma, falls as evidenced by adherence to CM Team Scheduled appointments     work with Education officer, museum to address community resource needs.  collaboration with Consulting civil engineer, provider, and care team.   Interventions: 1:1 collaboration with primary  care provider regarding development and update of comprehensive plan of care as evidenced by provider attestation and co-signature Inter-disciplinary care team collaboration (see longitudinal plan of care) Evaluation of current treatment plan related to  self management and patient's adherence to plan as established by provider   Multiple myeloma  (Status:  New goal.)  Long Term  Goal Evaluation of current treatment plan related to HTN and Multiple Myeloma, falls ,  self-management and patient's adherence to plan as established by provider. Discussed plans with patient for ongoing care management follow up and provided patient with direct contact information for care management team Advised to eat small frequent meals during the day, manage fatigue, stay hydrated Medications reviewed and compliance discussed Advised to follow up with providers as recommended.  Hypertension Interventions:  (Status:  New goal.) Long Term Goal Last practice recorded BP readings:  BP Readings from Last 3 Encounters:  01/25/22 (!) 165/81  12/28/21 (!) 142/95  12/22/21 140/78  Most recent eGFR/CrCl: No results found for: EGFR  No components found for: CRCL  Evaluation of current treatment plan related to hypertension self management and patient's adherence to plan as established by provider Reviewed medications with patient and discussed importance of compliance Discussed plans with patient for ongoing care management follow up and provided patient with direct contact information for care management team Reviewed scheduled/upcoming provider appointments  Advised to consider monitoring blood pressure at home    Falls Interventions:  (Status:  New goal.) Long Term Goal Provided written and verbal education re: potential causes of falls and Fall prevention strategies Reviewed medications and discussed potential side effects of medications such as dizziness and frequent urination Advised patient of importance of notifying provider of falls Assessed patients knowledge of fall risk prevention secondary to previously provided education Discussed life alert system.  Patient offered education information.  Declined at this time.    Patient Goals/Self-Care Activities: Take medications as prescribed   Attend all scheduled provider appointments Call pharmacy for medication refills 3-7 days in  advance of running out of medications Call provider office for new concerns or questions  Eat small frequent meals during the day, manage fatigue ( take rest breaks when needed), stay hydrated Follow a low salt diet.  Review fall prevention education article sent to you in my chart Keep walkways free of clutter, remove throw rugs, keep areas in home well lit.  Use nightlight at night in room/ bathroom/ hallway Consider monitoring blood pressure at home 2-3 times per week and recording.        Consent to CCM Services: Ms. Davee was given information about Chronic Care Management services including:  CCM service includes personalized support from designated clinical staff supervised by her physician, including individualized plan of care and coordination with other care providers 24/7 contact phone numbers for assistance for urgent and routine care needs. Service will only be billed when office clinical staff spend 20 minutes or more in a month to coordinate care. Only one practitioner may furnish and bill the service in a calendar month. The patient may stop CCM services at any time (effective at the end of the month) by phone call to the office staff. The patient will be responsible for cost sharing (co-pay) of up to 20% of the service fee (after annual deductible is met).  Patient agreed to services and verbal consent obtained.   Patient verbalizes understanding of instructions and care plan provided today and agrees to view in [place]. Active MyChart status confirmed with patient.  The patient has been provided with contact information for the care management team and has been advised to call with any health related questions or concerns.  The care management team will reach out to the patient again over the next 45 days.   Quinn Plowman RN,BSN,CCM RN Case Manager [place]  [phone]
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
## 2022-02-09 NOTE — Chronic Care Management (AMB) (Signed)
Chronic Care Management   CCM RN Visit Note  02/09/2022 Name: Wendy [name] MRN: [id number] DOB: 1946/06/29  Subjective: Wendy [name] is a 76 y.o. year old female who is a primary care patient of Tower, Wynelle Fanny, MD. The care management team was consulted for assistance with disease management and care coordination needs.    Engaged with patient by telephone for follow up visit in response to provider referral for case management and/or care coordination services.   Consent to Services:  The patient was given the following information about Chronic Care Management services today, agreed to services, and gave verbal consent: 1. CCM service includes personalized support from designated clinical staff supervised by the primary care provider, including individualized plan of care and coordination with other care providers 2. 24/7 contact phone numbers for assistance for urgent and routine care needs. 3. Service will only be billed when office clinical staff spend 20 minutes or more in a month to coordinate care. 4. Only one practitioner may furnish and bill the service in a calendar month. 5.The patient may stop CCM services at any time (effective at the end of the month) by phone call to the office staff. 6. The patient will be responsible for cost sharing (co-pay) of up to 20% of the service fee (after annual deductible is met). Patient agreed to services and consent obtained.  Patient agreed to services and verbal consent obtained.   Assessment: Review of patient past medical history, allergies, medications, health status, including review of consultants reports, laboratory and other test data, was performed as part of comprehensive evaluation and provision of chronic care management services.   SDOH (Social Determinants of Health) assessments and interventions performed:  SDOH Interventions    Flowsheet Row Most Recent Value  SDOH Interventions   Food Insecurity Interventions Intervention Not  Indicated  Housing Interventions Intervention Not Indicated  Transportation Interventions Intervention Not Indicated  Depression Interventions/Treatment  Counseling        CCM Care Plan  Allergies  Allergen Reactions   Bentyl [Dicyclomine Hcl] Other (See Comments)    Groggy, blurred vision   Butalbital-Aspirin-Caffeine Other (See Comments)    hallucinations   Clonidine Derivatives Other (See Comments)    dizziness, lightheadedness, abdominal cramping, dry mouth/throat   Linzess [Linaclotide] Diarrhea   Morphine And Related Other (See Comments)    Does not work   Motrin [Ibuprofen] Other (See Comments)    GI upset   Penicillins Other (See Comments)    As child; reaction unknown Has patient had a PCN reaction causing immediate rash, facial/tongue/throat swelling, SOB or lightheadedness with hypotension: No Has patient had a PCN reaction causing severe rash involving mucus membranes or skin necrosis: No Has patient had a PCN reaction that required hospitalization: No Has patient had a PCN reaction occurring within the last 10 years: No If all of the above answers are "NO", then may proceed with Cephalosporin use.   Sulfa Antibiotics Other (See Comments)    In childhood   Zanaflex [Tizanidine Hcl] Other (See Comments)    Decreased BP   Diphenhydramine Hcl Palpitations    restlessness    Outpatient Encounter Medications as of 02/09/2022  Medication Sig   acetaminophen (TYLENOL) 325 MG tablet Take 2 tablets (650 mg total) by mouth every 6 (six) hours as needed for mild pain, fever or headache.   acyclovir (ZOVIRAX) 400 MG tablet TAKE ONE TABLET BY MOUTH TWICE DAILY   albuterol (VENTOLIN HFA) 108 (90 Base) MCG/ACT inhaler Inhale 2  puffs by mouth into the lungs every 6 hours as needed for wheezing or shortness of breath   alclomethasone (ACLOVATE) 0.05 % ointment apply topically to the lips once daily as needed   ALPRAZolam (XANAX) 0.5 MG tablet TAKE 1/2 TABLETS (0.25 MG TOTAL) BY  MOUTH DAILY AS NEEDED FOR ANXIETY OR SLEEP.   bumetanide (BUMEX) 0.5 MG tablet Take 1 tablet (0.5 mg total) by mouth daily for 7 days. Takes an additional tablet if needed   Calcium Carbonate Antacid (TUMS PO) Take 2-4 capsules by mouth daily as needed (heartburn).   cephALEXin (KEFLEX) 500 MG capsule Take 1 capsule (500 mg total) by mouth 2 (two) times daily for 7 days.   Cholecalciferol (VITAMIN D) 2000 UNITS tablet Take 2,000 Units by mouth daily.   denosumab (PROLIA) 60 MG/ML SOSY injection Inject 60 mg into the skin every 6 (six) months.   diphenoxylate-atropine (LOMOTIL) 2.5-0.025 MG tablet Take 1 tablet by mouth 4 (four) times daily as needed for diarrhea or loose stools.   esomeprazole (NEXIUM) 40 MG capsule Take 1 capsule (40 mg total) by mouth daily.   Melatonin 10 MG TABS Take 10 mg by mouth at bedtime.   metoprolol tartrate (LOPRESSOR) 25 MG tablet TAKE 1/2 TABLET (12.5 MG TOTAL) BY MOUTH TWO TIMES DAILY.   potassium chloride SA (KLOR-CON) 20 MEQ tablet Take 1 tablet (20 mEq total) by mouth 2 (two) times daily. (Patient taking differently: Take 20 mEq by mouth 2 (two) times daily. Patient taking 1 tablet per day)   Propylene Glycol (SYSTANE BALANCE OP) Place 1 drop into both eyes 2 (two) times daily.   rosuvastatin (CRESTOR) 10 MG tablet Take 1 tablet (10 mg total) by mouth daily.   zolpidem (AMBIEN) 10 MG tablet TAKE ONE TABLET BY MOUTH AT BEDTIME AS NEEDED FOR SLEEP   traMADol (ULTRAM) 50 MG tablet TAKE 1 TO 2 TABLETS(50 TO 100 MG) BY MOUTH EVERY 6 HOURS AS NEEDED (Patient not taking: Reported on 02/09/2022)   No facility-administered encounter medications on file as of 02/09/2022.    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   Situational anxiety 01/11/2021   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   Hyperlipidemia 10/19/2020   Hearing loss 06/14/2020   Pedal edema 06/01/2020   Aortic atherosclerosis ([place]) 04/06/2020   CAD (coronary artery disease) 04/06/2020   H/O compression fracture of spine 04/06/2020   Chronic back pain 04/06/2020   Abnormal CT scan of lung 03/17/2020   Heartburn 06/02/2018   Chronic constipation 12/31/2017   Blood glucose elevated 09/25/2017   History of ileus 06/07/2017   Right carpal tunnel syndrome 12/07/2016   Estrogen deficiency 09/24/2016   Routine general medical examination at a health care facility 09/04/2015   Colon cancer screening 12/11/2014   Encounter for Medicare annual wellness exam 05/17/2013   Osteoarthritis 03/28/2011   Degenerative disc disease, lumbar 03/28/2011   Hyponatremia 02/12/2011   Essential hypertension 08/01/2010   Osteoporosis 08/01/2010    Conditions to be addressed/monitored:HTN and Multiple myeloma, Falls  Care Plan : [facility] Plan of care  Updates made by Dannielle Karvonen, RN since 02/09/2022 12:00 AM     Problem: Chronic disease management education and care coordination needs.   Priority: High     Long-Range Goal: Development of plan of caer to address chronic disease managment and care coordination needs.   Start Date: 02/09/2022  Expected End Date: 04/20/2022  Priority: High  Note:   Current Barriers:  Knowledge Deficits related to plan of care for management of HTN and Multiple Myeloma, falls  Patient is 76 year old with history of HTN, Multiple Myeloma, fall.  Patient states initial diagnosis with multiple myeloma was approximately 1 year ago. She states she is currently undergoing treatments.  Patient reports last visit with oncology was 01/25/22.  Patient reports  sustaining a fall approximately 1 week ago without serious injury. She reports just bumps and bruises.  Patient states she uses a cane/ walker for ambulation.  She reports feeling isolated, lonely, and overwhelmed with health conditions. Patient reports having follow up with [facility] care management LCSW, Eduard Clos and states she has an appointment scheduled with counselor, Burnetta Sabin, LCSW.  Patient voiced excitement that she will be moving to [facility] assisted living in a couple of months. She states this will allow her to socialize more and feels her life will improve.   RNCM Clinical Goal(s):  Patient will verbalize understanding of plan for management of HTN and Multiple Myeloma, falls as evidenced by patient report and/ or notation in chart take all medications exactly as prescribed and will call provider for medication related questions as evidenced by patient report and /or notation in chart    attend all scheduled medical appointments:  as evidenced by patient report and / or notation in chart        continue to work with RN Care Manager and/or Social Worker to address care management and care coordination needs related to HTN and Multiple Myeloma, falls as evidenced by adherence to CM Team Scheduled appointments     work with Education officer, museum to address community resource needs.  collaboration with Consulting civil engineer, provider, and care team.   Interventions: 1:1 collaboration with primary care provider regarding development and update of comprehensive plan of care as evidenced by provider attestation and co-signature Inter-disciplinary care team collaboration (see longitudinal plan of care) Evaluation of current treatment plan related to  self management and patient's adherence to plan as established by provider   Multiple myeloma  (Status:  New goal.)  Long Term Goal Evaluation of current treatment plan related to HTN and Multiple Myeloma, falls ,  self-management and patient's adherence to  plan as established by provider. Discussed plans with patient for ongoing care management follow up and provided patient with direct contact information for care management team Advised to eat small frequent meals during the day, manage fatigue, stay hydrated Medications reviewed and compliance discussed Advised to follow up with providers as recommended.  Hypertension Interventions:  (Status:  New goal.) Long Term Goal Last practice recorded BP readings:  BP Readings from Last 3 Encounters:  01/25/22 (!) 165/81  12/28/21 (!) 142/95  12/22/21 140/78  Most recent eGFR/CrCl: No results found for: EGFR  No components found for: CRCL  Evaluation of current treatment plan related to hypertension self management and patient's adherence to plan as established by provider Reviewed medications with patient and discussed importance of compliance Discussed plans with patient for ongoing care management follow up and provided patient with direct contact information for care management team Reviewed  scheduled/upcoming provider appointments  Advised to consider monitoring blood pressure at home    Falls Interventions:  (Status:  New goal.) Long Term Goal Provided written and verbal education re: potential causes of falls and Fall prevention strategies Reviewed medications and discussed potential side effects of medications such as dizziness and frequent urination Advised patient of importance of notifying provider of falls Assessed patients knowledge of fall risk prevention secondary to previously provided education Discussed life alert system.  Patient offered education information.  Declined at this time.    Patient Goals/Self-Care Activities: Take medications as prescribed   Attend all scheduled provider appointments Call pharmacy for medication refills 3-7 days in advance of running out of medications Call provider office for new concerns or questions  Eat small frequent meals during the day,  manage fatigue ( take rest breaks when needed), stay hydrated Follow a low salt diet.  Review fall prevention education article sent to you in my chart Keep walkways free of clutter, remove throw rugs, keep areas in home well lit.  Use nightlight at night in room/ bathroom/ hallway Consider monitoring blood pressure at home 2-3 times per week and recording.        Plan:The patient has been provided with contact information for the care management team and has been advised to call with any health related questions or concerns.  The care management team will reach out to the patient again over the next 45 days. Quinn Plowman RN,BSN,CCM RN Case Manager [place]  [phone]

## 2022-02-11 NOTE — Assessment & Plan Note (Signed)
Pt is in tx for amyloidosis /gammopathy Treatment has weakened her  Has had a fall Interested in PT  Will order [facility] for PT for strength and fall prevention

## 2022-02-11 NOTE — Assessment & Plan Note (Signed)
Poor balance and gen weakness In cancer treatment  Has had falls HH PT ordered

## 2022-02-11 NOTE — Assessment & Plan Note (Signed)
Some pain/some weakness from her treatment

## 2022-02-11 NOTE — Assessment & Plan Note (Signed)
After fall getting into bed In pt undergoing cancer therapy and with OP Reassuring exam Films ordered

## 2022-02-11 NOTE — Assessment & Plan Note (Signed)
Worse after a fall In setting of OP with past comp fx LS film ordered

## 2022-02-11 NOTE — Assessment & Plan Note (Signed)
Due to generalized weakness due to recent cancer treatment  She slipped trying to get into bed  Pain in buttocks/low back and pelvic area Reassuring exa Films ordered  Would like ot start some PT for strength

## 2022-02-12 ENCOUNTER — Telehealth: Payer: Self-pay | Admitting: Family Medicine

## 2022-02-12 DIAGNOSIS — Z0279 Encounter for issue of other medical certificate: Secondary | ICD-10-CM

## 2022-02-12 NOTE — Telephone Encounter (Signed)
Pt daughter dropped off paperwork  Type of forms received:independent & sup. living  Routed to: [place] Paperwork received by : terrill   Individual made aware of 3-5 business day turn around (Y/N): y Form completed and patient made aware of charges(Y/N): y  Faxed to :   Form location:  dr Occupational psychologist

## 2022-02-12 NOTE — Addendum Note (Signed)
Addended by: Lurlean Nanny on: 02/12/2022 03:23 PM   Modules accepted: Orders

## 2022-02-12 NOTE — Telephone Encounter (Signed)
In your inbox.

## 2022-02-12 NOTE — Telephone Encounter (Signed)
Pt daughter dropped off paperwork  Type of forms received:independent & sup. living  Routed XE:[id number]  Paperwork received by : terrill   Individual made aware of 3-5 business day turn around (Y/N): y Form completed and patient made aware of charges(Y/N):y   Faxed to :   Form location:  dr Occupational psychologist

## 2022-02-12 NOTE — Telephone Encounter (Signed)
Pt daughter dropped off paperwork   Type of forms received:independent and sup. living  Routed PJ:[id number]  Paperwork received by :  terrill  Individual made aware of 3-5 business day turn around (Y/N): y Form completed and patient made aware of charges(Y/N):y   Faxed to :   Form location:  [facility]

## 2022-02-13 ENCOUNTER — Telehealth: Payer: Self-pay | Admitting: Family Medicine

## 2022-02-13 NOTE — Telephone Encounter (Signed)
Answered all questions with pt, pt isn't sure if she will need TB test or not, if pt need TB test she will call back and get a lab appt for TB lab test, if she doesn't need lab will get form sent   Holding form until I hear back from pt

## 2022-02-13 NOTE — Telephone Encounter (Signed)
Home Health verbal orders Caller Name: brianne Agency Name: [facility] number: [phone]  Requesting OT/PT/Skilled nursing/Social Work/Speech: pt, ot eval  Reason: strengthen and gait training  Frequency: 1w1 2w3 1w4 ot eval 1w1  Please forward to [facility] pool or providers CMA

## 2022-02-13 NOTE — Telephone Encounter (Signed)
In IN box: Please review the un answered questions with pt/caregiver re: incontinence, alcohol, use of wheelchair etc Also for what she requires asst with and if she can admin own med   Asks if appl req asst or supervision from licensed/specialized services and I don't know what that means (? Physical tx)   Not sure if she needs TB testing   thanks

## 2022-02-13 NOTE — Telephone Encounter (Signed)
Please ok that verbal order  

## 2022-02-14 NOTE — Telephone Encounter (Signed)
VO given to [facility]

## 2022-02-15 ENCOUNTER — Other Ambulatory Visit: Payer: Self-pay | Admitting: Family Medicine

## 2022-02-15 NOTE — Telephone Encounter (Signed)
Name of Medication: Ambien Name of Pharmacy: [facility] or Written Date and Quantity:10/24/21 #30 tabs 3 refills Last Office Visit and Type: 02/09/22 Balance issues Next Office Visit and Type:  Last Controlled Substance Agreement Date: 05/27/13 Last UDS:05/27/13

## 2022-02-16 ENCOUNTER — Telehealth: Payer: Self-pay | Admitting: Family Medicine

## 2022-02-16 NOTE — Telephone Encounter (Signed)
Home Health verbal orders Caller Name: zacharia white Agency Name: Myrtie Cruise number: [phone]  Requesting OT/PT/Skilled nursing/Social Work/Speech: OT  Reason: iadl, strengthening, self- care   Frequency: 1w5  Please forward to [facility] pool or providers CMA

## 2022-02-16 NOTE — Telephone Encounter (Signed)
Checked state database and last fill dates are   10/24/21, 11/22/21, 12/20/21, 01/17/22

## 2022-02-16 NOTE — Telephone Encounter (Signed)
Please ok that verbal order  

## 2022-02-16 NOTE — Telephone Encounter (Signed)
VO given.

## 2022-02-19 ENCOUNTER — Telehealth: Payer: Self-pay

## 2022-02-19 ENCOUNTER — Telehealth: Payer: Medicare Other

## 2022-02-19 NOTE — Telephone Encounter (Signed)
Pt daughter called she stated that her mother doesn't need the TB test

## 2022-02-19 NOTE — Telephone Encounter (Signed)
Forms faxed back to [place] and left VM requesting pt to call the office back to see if she wants to pick up forms or have forms mailed back to her  Copy sent to scanning

## 2022-02-19 NOTE — Telephone Encounter (Signed)
PA for Zolpidem submitted via covermymeds. Awaiting response.  Key: Owens Shark

## 2022-02-20 ENCOUNTER — Ambulatory Visit: Admission: EM | Admit: 2022-02-20 | Payer: Self-pay

## 2022-02-20 ENCOUNTER — Encounter: Payer: Self-pay | Admitting: Emergency Medicine

## 2022-02-20 ENCOUNTER — Ambulatory Visit
Admission: EM | Admit: 2022-02-20 | Discharge: 2022-02-20 | Disposition: A | Discharge status: Home / Self Care | Payer: Medicare Other | Attending: Emergency Medicine | Admitting: Emergency Medicine

## 2022-02-20 ENCOUNTER — Telehealth: Payer: Medicare Other | Admitting: Emergency Medicine

## 2022-02-20 ENCOUNTER — Other Ambulatory Visit: Payer: Self-pay

## 2022-02-20 DIAGNOSIS — M545 Low back pain, unspecified: Secondary | ICD-10-CM | POA: Diagnosis present

## 2022-02-20 DIAGNOSIS — N3001 Acute cystitis with hematuria: Secondary | ICD-10-CM | POA: Diagnosis present

## 2022-02-20 DIAGNOSIS — N3000 Acute cystitis without hematuria: Secondary | ICD-10-CM

## 2022-02-20 LAB — POCT URINALYSIS DIP (MANUAL ENTRY)
Bilirubin, UA: NEGATIVE
Glucose, UA: NEGATIVE mg/dL
Ketones, POC UA: NEGATIVE mg/dL
Nitrite, UA: NEGATIVE
Protein Ur, POC: >=300 mg/dL — AB
Spec Grav, UA: 1.025 (ref 1.010–1.025)
Urobilinogen, UA: 0.2 E.U./dL
pH, UA: 6 (ref 5.0–8.0)

## 2022-02-20 MED ORDER — CIPROFLOXACIN HCL 500 MG PO TABS
500.0000 mg | ORAL_TABLET | Freq: Two times a day (BID) | ORAL | 0 refills | Status: AC
Start: 2022-02-20 — End: 2022-02-25

## 2022-02-20 NOTE — Progress Notes (Addendum)
Based on what you shared with me, I feel your condition warrants further evaluation and I recommend that you be seen in a face to face visit. When you have a UTI and it doesn't get better with antibiotics, you need more care than I can give through an e-visit. I think you need to have your urine tested to see which antibiotic will help you with this infection.    NOTE: There will be NO CHARGE for this eVisit   If you are having a true medical emergency please call 911.      For an urgent face to face visit, [place] has six urgent care centers for your convenience:     [facility] [place] Get Driving Directions [phone] [place], [place] [address]    [facility]) Get Driving Directions [phone] [place], [place] [address]  [facility] ([place]) Get Driving Directions [phone] [address] [place],  [place]  [address]  [facility] Get Driving Directions [phone] [address] [place], [place], [place] [address]   [facility] Get Driving Directions  [phone] [address], [place] [address]   [facility] Get Driving Directions [phone] [address], [place], [place] [address]  Your MyChart E-visit questionnaire answers were reviewed by a board certified advanced clinical practitioner to complete your personal care plan based on your specific symptoms.  Thank you for using e-Visits.

## 2022-02-20 NOTE — Discharge Instructions (Addendum)
Go to the ER for fevers above 100.4, flulike symptoms, or if the back pain gets worse.  Try Voltaren cream, continue the Tylenol, heat and gentle stretching for your back.  I have sent your urine off for culture to confirm the presence or absence of a urinary tract infection.  In the meantime, start the Cipro.  Drink plenty of extra fluids.  Unfortunately, I cannot prescribe Pyridium because your kidney function is a little low.  Also, keep an eye on your blood pressure.  It was elevated today.
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
## 2022-02-20 NOTE — ED Triage Notes (Signed)
Pt has urinary frequency, back pain, and dysuria x 3 days. Pt just finished a round of abx for a UTI.

## 2022-02-22 ENCOUNTER — Inpatient Hospital Stay: Payer: Medicare Other | Attending: Hematology and Oncology

## 2022-02-22 ENCOUNTER — Inpatient Hospital Stay (HOSPITAL_BASED_OUTPATIENT_CLINIC_OR_DEPARTMENT_OTHER): Payer: Medicare Other | Admitting: Hematology and Oncology

## 2022-02-22 ENCOUNTER — Other Ambulatory Visit: Payer: Self-pay

## 2022-02-22 ENCOUNTER — Inpatient Hospital Stay: Payer: Medicare Other

## 2022-02-22 ENCOUNTER — Other Ambulatory Visit: Payer: Self-pay | Admitting: Hematology and Oncology

## 2022-02-22 ENCOUNTER — Encounter: Payer: Self-pay | Admitting: Family Medicine

## 2022-02-22 VITALS — BP 133/71 | HR 86 | Temp 97.6°F | Resp 17 | Wt 108.2 lb

## 2022-02-22 DIAGNOSIS — D500 Iron deficiency anemia secondary to blood loss (chronic): Secondary | ICD-10-CM

## 2022-02-22 DIAGNOSIS — E8581 Light chain (AL) amyloidosis: Secondary | ICD-10-CM

## 2022-02-22 DIAGNOSIS — Z5112 Encounter for antineoplastic immunotherapy: Secondary | ICD-10-CM | POA: Diagnosis present

## 2022-02-22 DIAGNOSIS — R803 Bence Jones proteinuria: Secondary | ICD-10-CM | POA: Diagnosis not present

## 2022-02-22 DIAGNOSIS — Z8639 Personal history of other endocrine, nutritional and metabolic disease: Secondary | ICD-10-CM | POA: Insufficient documentation

## 2022-02-22 DIAGNOSIS — Z79899 Other long term (current) drug therapy: Secondary | ICD-10-CM | POA: Insufficient documentation

## 2022-02-22 LAB — CBC WITH DIFFERENTIAL (CANCER CENTER ONLY)
Abs Immature Granulocytes: 0.02 10*3/uL (ref 0.00–0.07)
Basophils Absolute: 0 10*3/uL (ref 0.0–0.1)
Basophils Relative: 1 %
Eosinophils Absolute: 0.1 10*3/uL (ref 0.0–0.5)
Eosinophils Relative: 2 %
HCT: 32.9 % — ABNORMAL LOW (ref 36.0–46.0)
Hemoglobin: 11 g/dL — ABNORMAL LOW (ref 12.0–15.0)
Immature Granulocytes: 0 %
Lymphocytes Relative: 10 %
Lymphs Abs: 0.5 10*3/uL — ABNORMAL LOW (ref 0.7–4.0)
MCH: 30.6 pg (ref 26.0–34.0)
MCHC: 33.4 g/dL (ref 30.0–36.0)
MCV: 91.6 fL (ref 80.0–100.0)
Monocytes Absolute: 0.4 10*3/uL (ref 0.1–1.0)
Monocytes Relative: 8 %
Neutro Abs: 4.1 10*3/uL (ref 1.7–7.7)
Neutrophils Relative %: 79 %
Platelet Count: 445 10*3/uL — ABNORMAL HIGH (ref 150–400)
RBC: 3.59 MIL/uL — ABNORMAL LOW (ref 3.87–5.11)
RDW: 14.1 % (ref 11.5–15.5)
WBC Count: 5.1 10*3/uL (ref 4.0–10.5)
nRBC: 0 % (ref 0.0–0.2)

## 2022-02-22 LAB — CMP (CANCER CENTER ONLY)
ALT: 7 U/L (ref 0–44)
AST: 13 U/L — ABNORMAL LOW (ref 15–41)
Albumin: 3.3 g/dL — ABNORMAL LOW (ref 3.5–5.0)
Alkaline Phosphatase: 170 U/L — ABNORMAL HIGH (ref 38–126)
Anion gap: 7 (ref 5–15)
BUN: 20 mg/dL (ref 8–23)
CO2: 28 mmol/L (ref 22–32)
Calcium: 8.3 mg/dL — ABNORMAL LOW (ref 8.9–10.3)
Chloride: 98 mmol/L (ref 98–111)
Creatinine: 0.85 mg/dL (ref 0.44–1.00)
GFR, Estimated: >60 mL/min (ref >60)
Glucose, Bld: 115 mg/dL — ABNORMAL HIGH (ref 70–99)
Potassium: 3.6 mmol/L (ref 3.5–5.1)
Sodium: 133 mmol/L — ABNORMAL LOW (ref 135–145)
Total Bilirubin: 0.4 mg/dL (ref 0.3–1.2)
Total Protein: 5.8 g/dL — ABNORMAL LOW (ref 6.5–8.1)

## 2022-02-22 LAB — IRON AND IRON BINDING CAPACITY (CC-WL,HP ONLY)
Iron: 34 ug/dL (ref 28–170)
Saturation Ratios: 10 % — ABNORMAL LOW (ref 10.4–31.8)
TIBC: 339 ug/dL (ref 250–450)
UIBC: 305 ug/dL (ref 148–442)

## 2022-02-22 LAB — FERRITIN: Ferritin: 193 ng/mL (ref 11–307)

## 2022-02-22 LAB — LACTATE DEHYDROGENASE: LDH: 220 U/L — ABNORMAL HIGH (ref 98–192)

## 2022-02-22 MED ORDER — DARATUMUMAB-HYALURONIDASE-FIHJ 1800-30000 MG-UT/15ML ~~LOC~~ SOLN
1800.0000 mg | Freq: Once | SUBCUTANEOUS | Status: AC
  Administered 2022-02-22: 1800 mg via SUBCUTANEOUS
  Filled 2022-02-22: qty 15

## 2022-02-22 MED ORDER — DEXAMETHASONE 4 MG PO TABS
10.0000 mg | ORAL_TABLET | Freq: Once | ORAL | Status: AC
  Administered 2022-02-22: 10 mg via ORAL
  Filled 2022-02-22: qty 3

## 2022-02-22 MED ORDER — ACETAMINOPHEN 325 MG PO TABS
650.0000 mg | ORAL_TABLET | Freq: Once | ORAL | Status: AC
  Administered 2022-02-22: 650 mg via ORAL
  Filled 2022-02-22: qty 2
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
## 2022-02-22 NOTE — Patient Instructions (Signed)
[facility]  Discharge Instructions: ?Thank you for choosing [facility] to provide your oncology and hematology care.  ? ?If you have a lab appointment with the [facility], please go directly to the [facility] and check in at the registration area. ?  ?Wear comfortable clothing and clothing appropriate for easy access to any Portacath or PICC line.  ? ?We strive to give you quality time with your provider. You may need to reschedule your appointment if you arrive late (15 or more minutes).  Arriving late affects you and other patients whose appointments are after yours.  Also, if you miss three or more appointments without notifying the office, you may be dismissed from the clinic at the provider?s discretion.    ?  ?For prescription refill requests, have your pharmacy contact our office and allow 72 hours for refills to be completed.   ? ?Today you received the following chemotherapy and/or immunotherapy agent: Daratumumab (Darzalex Faspro) ?  ?To help prevent nausea and vomiting after your treatment, we encourage you to take your nausea medication as directed. ? ?BELOW ARE SYMPTOMS THAT SHOULD BE REPORTED IMMEDIATELY: ?*FEVER GREATER THAN 100.4 F (38 ?C) OR HIGHER ?*CHILLS OR SWEATING ?*NAUSEA AND VOMITING THAT IS NOT CONTROLLED WITH YOUR NAUSEA MEDICATION ?*UNUSUAL SHORTNESS OF BREATH ?*UNUSUAL BRUISING OR BLEEDING ?*URINARY PROBLEMS (pain or burning when urinating, or frequent urination) ?*BOWEL PROBLEMS (unusual diarrhea, constipation, pain near the anus) ?TENDERNESS IN MOUTH AND THROAT WITH OR WITHOUT PRESENCE OF ULCERS (sore throat, sores in mouth, or a toothache) ?UNUSUAL RASH, SWELLING OR PAIN  ?UNUSUAL VAGINAL DISCHARGE OR ITCHING  ? ?Items with * indicate a potential emergency and should be followed up as soon as possible or go to the Emergency Department if any problems should occur. ? ?Please show the CHEMOTHERAPY ALERT CARD or IMMUNOTHERAPY ALERT CARD  at check-in to the Emergency Department and triage nurse. ? ?Should you have questions after your visit or need to cancel or reschedule your appointment, please contact [facility]  Dept: [phone]  and follow the prompts.  Office hours are 8:00 a.m. to 4:30 p.m. Monday - Friday. Please note that voicemails left after 4:00 p.m. may not be returned until the following business day.  We are closed weekends and major holidays. You have access to a nurse at all times for urgent questions. Please call the main number to the clinic Dept: [phone] and follow the prompts. ? ? ?For any non-urgent questions, you may also contact your provider using MyChart. We now offer e-Visits for anyone 63 and older to request care online for non-urgent symptoms. For details visit [email or web address]. ?  ?Also download the MyChart app! Go to the app store, search "MyChart", open the app, select [facility], and log in with your MyChart username and password. ? ?Due to Covid, a mask is required upon entering the hospital/clinic. If you do not have a mask, one will be given to you upon arrival. For doctor visits, patients may have 1 support Akirah Storck aged 17 or older with them. For treatment visits, patients cannot have anyone with them due to current Covid guidelines and our immunocompromised population.  ? ?

## 2022-02-23 ENCOUNTER — Other Ambulatory Visit: Payer: Self-pay | Admitting: Hematology and Oncology

## 2022-02-23 ENCOUNTER — Telehealth: Payer: Medicare Other

## 2022-02-23 ENCOUNTER — Telehealth: Payer: Self-pay | Admitting: Hematology and Oncology

## 2022-02-23 LAB — URINE CULTURE: Culture: >=100000 — AB

## 2022-02-23 LAB — KAPPA/LAMBDA LIGHT CHAINS
Kappa free light chain: 9.9 mg/L (ref 3.3–19.4)
Kappa, lambda light chain ratio: 0.36 (ref 0.26–1.65)
Lambda free light chains: 27.6 mg/L — ABNORMAL HIGH (ref 5.7–26.3)

## 2022-02-23 NOTE — Telephone Encounter (Signed)
PA was denied. Denial letter placed in your inbox for review.  ?

## 2022-02-23 NOTE — Telephone Encounter (Signed)
Scheduled per 3/2 los, message has been left with pt ?

## 2022-02-23 NOTE — Telephone Encounter (Signed)
See phone note, Rx was sent in recently but PA was denied, letter in your inbox  ?

## 2022-02-26 MED ORDER — ZOLPIDEM TARTRATE 10 MG PO TABS: 10.0000 mg | ORAL_TABLET | Freq: Every evening | ORAL | 3 refills | Status: DC | PRN

## 2022-02-26 NOTE — Telephone Encounter (Signed)
She decided to pay for this out of pocket ?Px re sent with msg to pharmacy re: that  ?

## 2022-02-27 ENCOUNTER — Encounter: Payer: Self-pay | Admitting: Family Medicine

## 2022-02-28 MED ORDER — TRAMADOL HCL 50 MG PO TABS: 50.0000 mg | ORAL_TABLET | Freq: Two times a day (BID) | ORAL | 0 refills | Status: AC | PRN

## 2022-03-02 LAB — MULTIPLE MYELOMA PANEL, SERUM
Albumin SerPl Elph-Mcnc: 2.7 g/dL — ABNORMAL LOW (ref 2.9–4.4)
Albumin/Glob SerPl: 1.1 (ref 0.7–1.7)
Alpha 1: 0.4 g/dL (ref 0.0–0.4)
Alpha2 Glob SerPl Elph-Mcnc: 0.9 g/dL (ref 0.4–1.0)
B-Globulin SerPl Elph-Mcnc: 0.9 g/dL (ref 0.7–1.3)
Gamma Glob SerPl Elph-Mcnc: 0.3 g/dL — ABNORMAL LOW (ref 0.4–1.8)
Globulin, Total: 2.5 g/dL (ref 2.2–3.9)
IgA: 58 mg/dL — ABNORMAL LOW (ref 64–422)
IgG (Immunoglobin G), Serum: 220 mg/dL — ABNORMAL LOW (ref 586–1602)
IgM (Immunoglobulin M), Srm: 33 mg/dL (ref 26–217)
M Protein SerPl Elph-Mcnc: 0.1 g/dL — ABNORMAL HIGH
Total Protein ELP: 5.2 g/dL — ABNORMAL LOW (ref 6.0–8.5)

## 2022-03-10 ENCOUNTER — Emergency Department: Payer: Medicare Other

## 2022-03-10 ENCOUNTER — Inpatient Hospital Stay
Admission: EM | Admit: 2022-03-10 | Discharge: 2022-03-19 | DRG: 207 | Disposition: A | Discharge status: Skilled Nursing Facility | Payer: Medicare Other | Attending: Internal Medicine | Admitting: Internal Medicine

## 2022-03-10 DIAGNOSIS — E875 Hyperkalemia: Secondary | ICD-10-CM | POA: Diagnosis present

## 2022-03-10 DIAGNOSIS — E861 Hypovolemia: Secondary | ICD-10-CM | POA: Diagnosis present

## 2022-03-10 DIAGNOSIS — Z8249 Family history of ischemic heart disease and other diseases of the circulatory system: Secondary | ICD-10-CM

## 2022-03-10 DIAGNOSIS — K5900 Constipation, unspecified: Secondary | ICD-10-CM | POA: Diagnosis present

## 2022-03-10 DIAGNOSIS — I639 Cerebral infarction, unspecified: Secondary | ICD-10-CM

## 2022-03-10 DIAGNOSIS — Z9221 Personal history of antineoplastic chemotherapy: Secondary | ICD-10-CM

## 2022-03-10 DIAGNOSIS — I6389 Other cerebral infarction: Secondary | ICD-10-CM | POA: Diagnosis present

## 2022-03-10 DIAGNOSIS — J9601 Acute respiratory failure with hypoxia: Secondary | ICD-10-CM | POA: Diagnosis not present

## 2022-03-10 DIAGNOSIS — R579 Shock, unspecified: Secondary | ICD-10-CM | POA: Diagnosis present

## 2022-03-10 DIAGNOSIS — E785 Hyperlipidemia, unspecified: Secondary | ICD-10-CM | POA: Diagnosis present

## 2022-03-10 DIAGNOSIS — G928 Other toxic encephalopathy: Secondary | ICD-10-CM | POA: Diagnosis present

## 2022-03-10 DIAGNOSIS — E871 Hypo-osmolality and hyponatremia: Secondary | ICD-10-CM | POA: Diagnosis present

## 2022-03-10 DIAGNOSIS — E859 Amyloidosis, unspecified: Secondary | ICD-10-CM | POA: Diagnosis present

## 2022-03-10 DIAGNOSIS — R29709 NIHSS score 9: Secondary | ICD-10-CM | POA: Diagnosis present

## 2022-03-10 DIAGNOSIS — K219 Gastro-esophageal reflux disease without esophagitis: Secondary | ICD-10-CM | POA: Diagnosis present

## 2022-03-10 DIAGNOSIS — Z66 Do not resuscitate: Secondary | ICD-10-CM | POA: Diagnosis present

## 2022-03-10 DIAGNOSIS — Z87891 Personal history of nicotine dependence: Secondary | ICD-10-CM

## 2022-03-10 DIAGNOSIS — Z681 Body mass index (BMI) 19 or less, adult: Secondary | ICD-10-CM

## 2022-03-10 DIAGNOSIS — C9000 Multiple myeloma not having achieved remission: Secondary | ICD-10-CM | POA: Diagnosis present

## 2022-03-10 DIAGNOSIS — Z9049 Acquired absence of other specified parts of digestive tract: Secondary | ICD-10-CM

## 2022-03-10 DIAGNOSIS — G8929 Other chronic pain: Secondary | ICD-10-CM | POA: Diagnosis present

## 2022-03-10 DIAGNOSIS — I509 Heart failure, unspecified: Secondary | ICD-10-CM

## 2022-03-10 DIAGNOSIS — J9602 Acute respiratory failure with hypercapnia: Secondary | ICD-10-CM | POA: Diagnosis present

## 2022-03-10 DIAGNOSIS — Z801 Family history of malignant neoplasm of trachea, bronchus and lung: Secondary | ICD-10-CM

## 2022-03-10 DIAGNOSIS — R4182 Altered mental status, unspecified: Principal | ICD-10-CM | POA: Diagnosis present

## 2022-03-10 DIAGNOSIS — G4700 Insomnia, unspecified: Secondary | ICD-10-CM | POA: Diagnosis present

## 2022-03-10 DIAGNOSIS — I3139 Other pericardial effusion (noninflammatory): Secondary | ICD-10-CM | POA: Diagnosis present

## 2022-03-10 DIAGNOSIS — E43 Unspecified severe protein-calorie malnutrition: Secondary | ICD-10-CM | POA: Diagnosis present

## 2022-03-10 DIAGNOSIS — Z20822 Contact with and (suspected) exposure to covid-19: Secondary | ICD-10-CM | POA: Diagnosis present

## 2022-03-10 DIAGNOSIS — I110 Hypertensive heart disease with heart failure: Secondary | ICD-10-CM | POA: Diagnosis present

## 2022-03-10 DIAGNOSIS — Z90710 Acquired absence of both cervix and uterus: Secondary | ICD-10-CM

## 2022-03-10 DIAGNOSIS — I2510 Atherosclerotic heart disease of native coronary artery without angina pectoris: Secondary | ICD-10-CM | POA: Diagnosis present

## 2022-03-10 DIAGNOSIS — I10 Essential (primary) hypertension: Secondary | ICD-10-CM | POA: Diagnosis present

## 2022-03-10 DIAGNOSIS — J9600 Acute respiratory failure, unspecified whether with hypoxia or hypercapnia: Secondary | ICD-10-CM | POA: Diagnosis present

## 2022-03-10 DIAGNOSIS — M810 Age-related osteoporosis without current pathological fracture: Secondary | ICD-10-CM | POA: Diagnosis present

## 2022-03-10 DIAGNOSIS — I671 Cerebral aneurysm, nonruptured: Secondary | ICD-10-CM | POA: Diagnosis present

## 2022-03-10 DIAGNOSIS — I5033 Acute on chronic diastolic (congestive) heart failure: Secondary | ICD-10-CM | POA: Diagnosis present

## 2022-03-10 DIAGNOSIS — N170 Acute kidney failure with tubular necrosis: Secondary | ICD-10-CM | POA: Diagnosis present

## 2022-03-10 DIAGNOSIS — R0602 Shortness of breath: Secondary | ICD-10-CM | POA: Diagnosis not present

## 2022-03-10 DIAGNOSIS — Z86718 Personal history of other venous thrombosis and embolism: Secondary | ICD-10-CM

## 2022-03-10 DIAGNOSIS — R569 Unspecified convulsions: Secondary | ICD-10-CM

## 2022-03-10 LAB — CBC WITH DIFFERENTIAL/PLATELET
Abs Immature Granulocytes: 0.06 10*3/uL (ref 0.00–0.07)
Basophils Absolute: 0.1 10*3/uL (ref 0.0–0.1)
Basophils Relative: 1 %
Eosinophils Absolute: 0.1 10*3/uL (ref 0.0–0.5)
Eosinophils Relative: 1 %
HCT: 38.3 % (ref 36.0–46.0)
Hemoglobin: 12.3 g/dL (ref 12.0–15.0)
Immature Granulocytes: 1 %
Lymphocytes Relative: 13 %
Lymphs Abs: 1.7 10*3/uL (ref 0.7–4.0)
MCH: 30.8 pg (ref 26.0–34.0)
MCHC: 32.1 g/dL (ref 30.0–36.0)
MCV: 95.8 fL (ref 80.0–100.0)
Monocytes Absolute: 0.9 10*3/uL (ref 0.1–1.0)
Monocytes Relative: 7 %
Neutro Abs: 10.1 10*3/uL — ABNORMAL HIGH (ref 1.7–7.7)
Neutrophils Relative %: 77 %
Platelets: 381 10*3/uL (ref 150–400)
RBC: 4 MIL/uL (ref 3.87–5.11)
RDW: 14.5 % (ref 11.5–15.5)
WBC: 12.8 10*3/uL — ABNORMAL HIGH (ref 4.0–10.5)
nRBC: 0 % (ref 0.0–0.2)

## 2022-03-10 LAB — COMPREHENSIVE METABOLIC PANEL
ALT: 14 U/L (ref 0–44)
AST: 25 U/L (ref 15–41)
Albumin: 3.3 g/dL — ABNORMAL LOW (ref 3.5–5.0)
Alkaline Phosphatase: 158 U/L — ABNORMAL HIGH (ref 38–126)
Anion gap: 9 (ref 5–15)
BUN: 20 mg/dL (ref 8–23)
CO2: 26 mmol/L (ref 22–32)
Calcium: 9.1 mg/dL (ref 8.9–10.3)
Chloride: 98 mmol/L (ref 98–111)
Creatinine, Ser: 0.85 mg/dL (ref 0.44–1.00)
GFR, Estimated: >60 mL/min (ref >60)
Glucose, Bld: 220 mg/dL — ABNORMAL HIGH (ref 70–99)
Potassium: 4 mmol/L (ref 3.5–5.1)
Sodium: 133 mmol/L — ABNORMAL LOW (ref 135–145)
Total Bilirubin: 0.7 mg/dL (ref 0.3–1.2)
Total Protein: 6.6 g/dL (ref 6.5–8.1)

## 2022-03-10 LAB — APTT: aPTT: 35 seconds (ref 24–36)

## 2022-03-10 LAB — TROPONIN I (HIGH SENSITIVITY): Troponin I (High Sensitivity): 46 ng/L — ABNORMAL HIGH (ref <18)

## 2022-03-10 LAB — CBG MONITORING, ED: Glucose-Capillary: 183 mg/dL — ABNORMAL HIGH (ref 70–99)

## 2022-03-10 LAB — PROTIME-INR
INR: 1.1 (ref 0.8–1.2)
Prothrombin Time: 14.1 seconds (ref 11.4–15.2)

## 2022-03-10 LAB — ETHANOL: Alcohol, Ethyl (B): <10 mg/dL (ref <10)

## 2022-03-10 IMAGING — DX DG CHEST 1V PORT
2 series · 2 of 2 positions shown · non-contrast
Comparison: Chest radiograph [DATE], CT [DATE]

CLINICAL DATA: Intubation and orogastric tube placement.

EXAM:
PORTABLE CHEST 1 VIEW

[chest ap (1 of 2)]
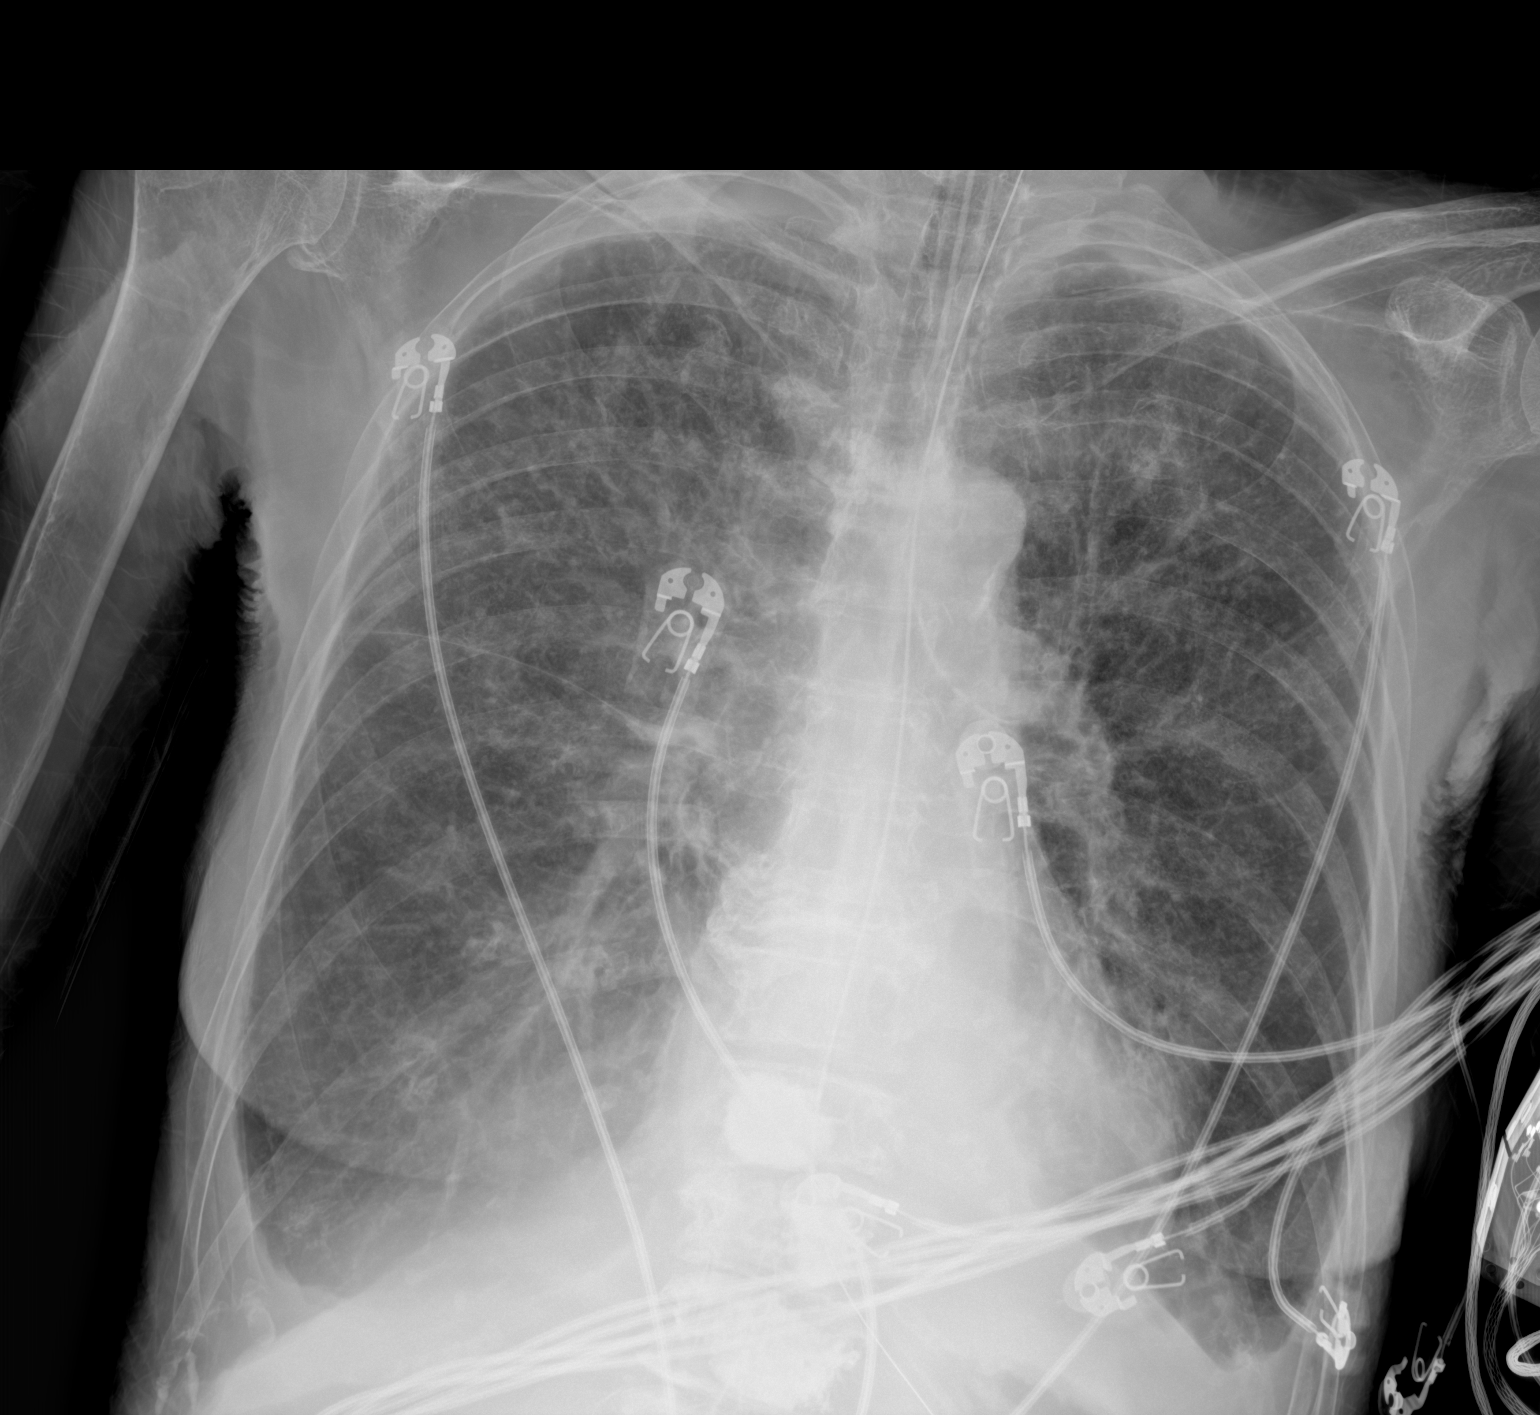

[chest ap (2 of 2)]
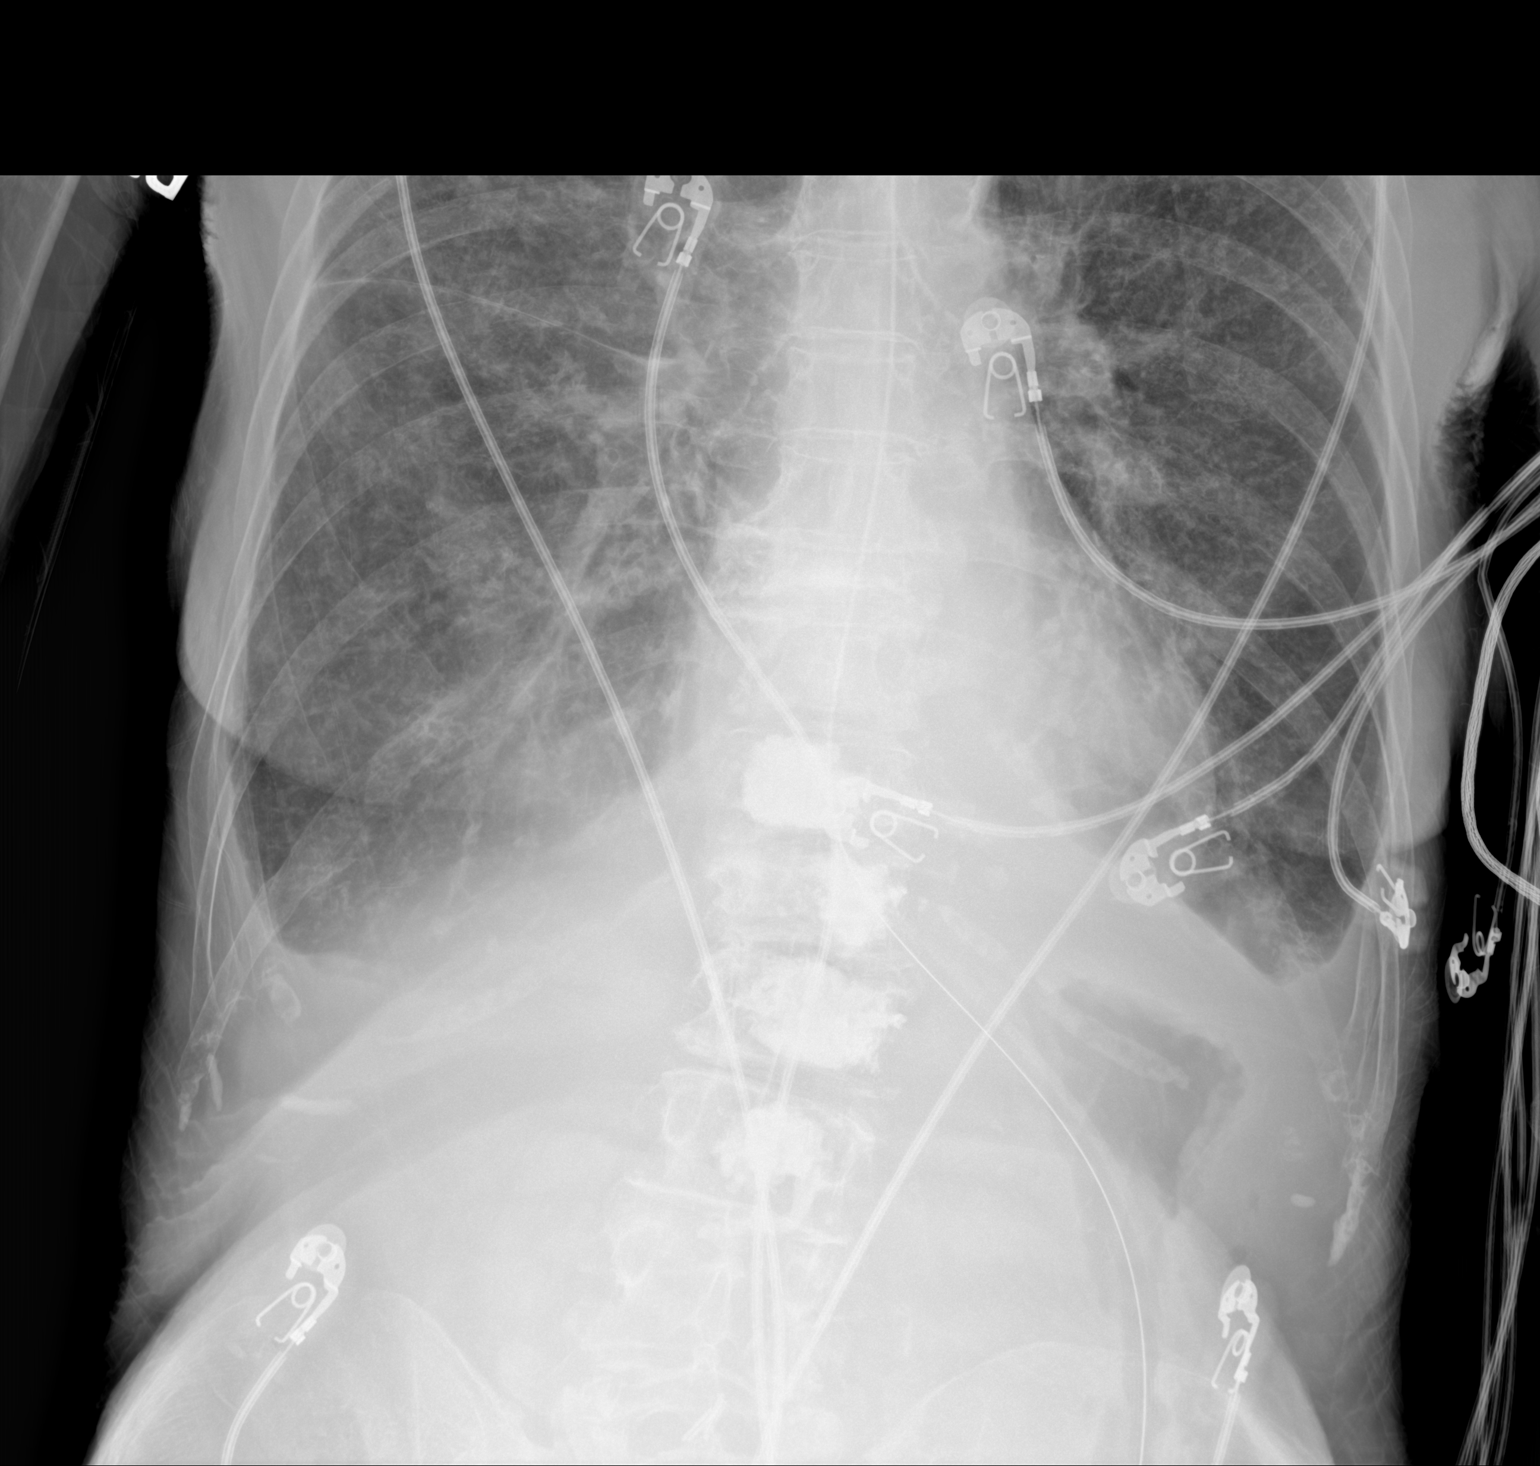

[2 of 2 positions shown; findings below may reference images not displayed]

FINDINGS: Endotracheal tube tip at the level of the clavicular heads. Enteric
tube tip below the diaphragm extending beyond the field of view. The
side-port is not seen. The lungs are hyperinflated. There is
progressive peribronchial thickening from prior exam. Bilateral
pleural effusions. The heart is normal in size. Known pulmonary
nodules on CT are not well demonstrated by radiograph, although
faintly visualized in the upper lobes. No pneumothorax. Lower
thoracic vertebroplasty.
IMPRESSION: 1. Endotracheal tube and enteric tube in place.
2. Progressive peribronchial thickening suggestive of pulmonary
edema. Bilateral pleural effusions.

## 2022-03-10 IMAGING — CT CT HEAD CODE STROKE
4 series · 16 of 47 positions shown, 18 images · non-contrast
Comparison: None.

CLINICAL DATA: Code stroke.  Acute neurologic deficits



[Series 5: head wo · axial · 0.47mm/px · z∈[-83,+37]mm · 6 of 34 slices shown, 8 images]
[im 5/34  brain]
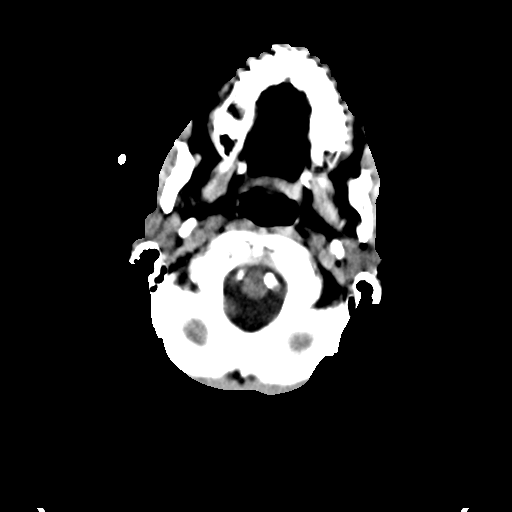
[im 5/34  bone]
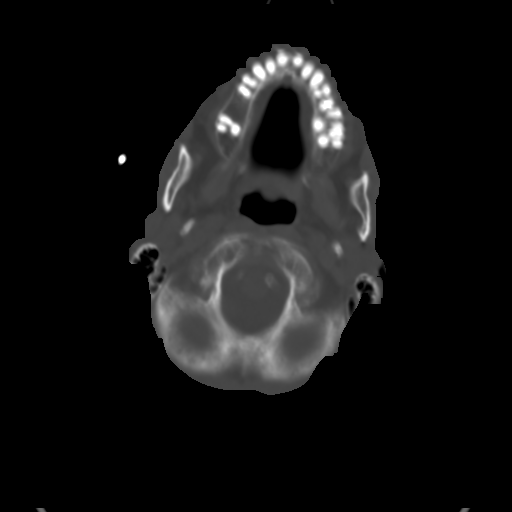
[im 10/34  brain]
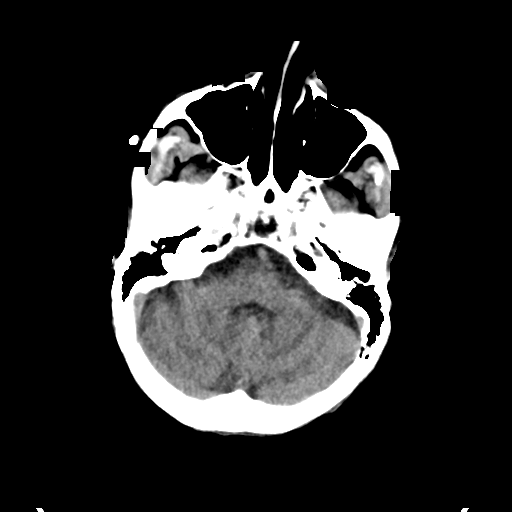
[im 15/34  brain]
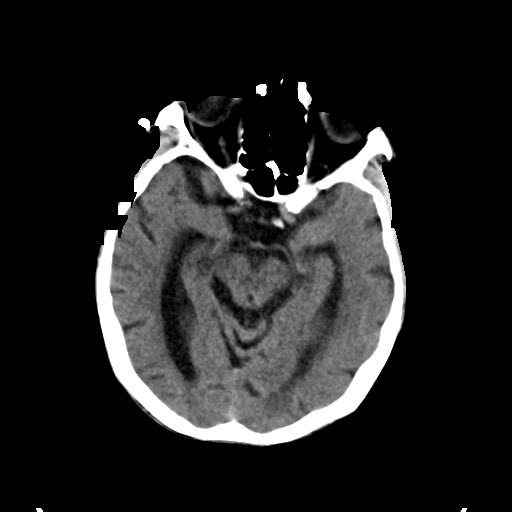
[im 19/34  brain]
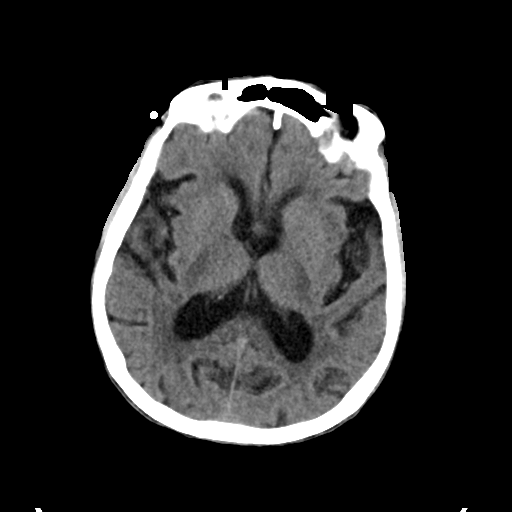
[im 24/34  brain]
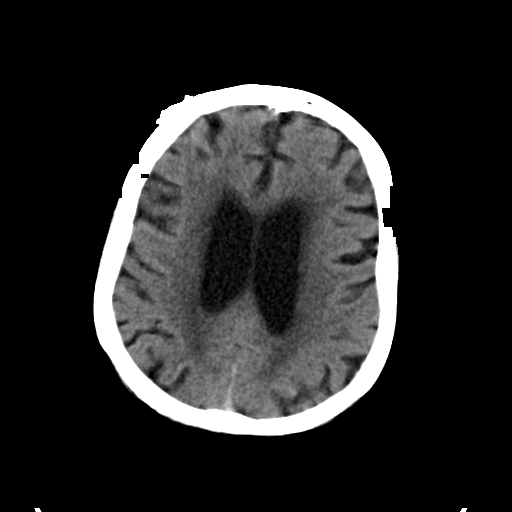
[im 24/34  bone]
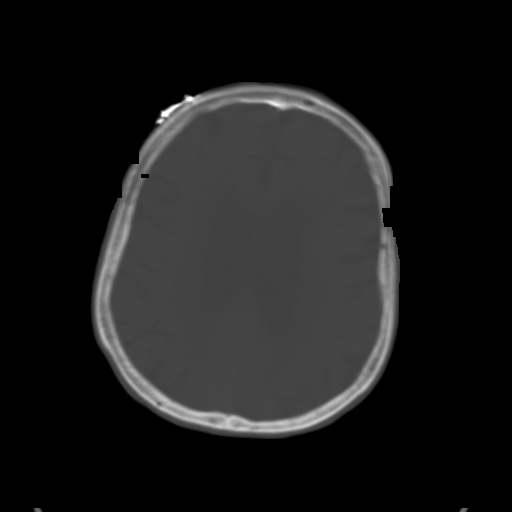
[im 29/34  brain]
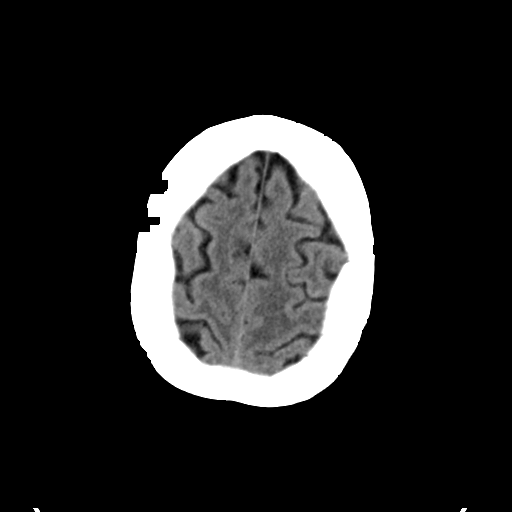

[Series 6: head bone · axial · 0.47mm/px · z∈[-87,-31]mm · 4 of 87 slices shown]
[im 9/87  bone]
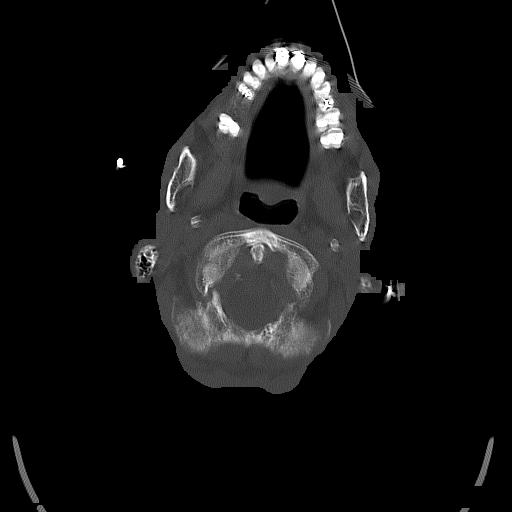
[im 17/87  bone]
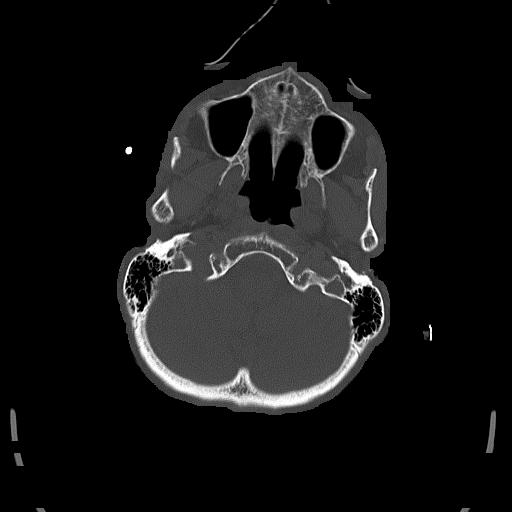
[im 29/87  bone]
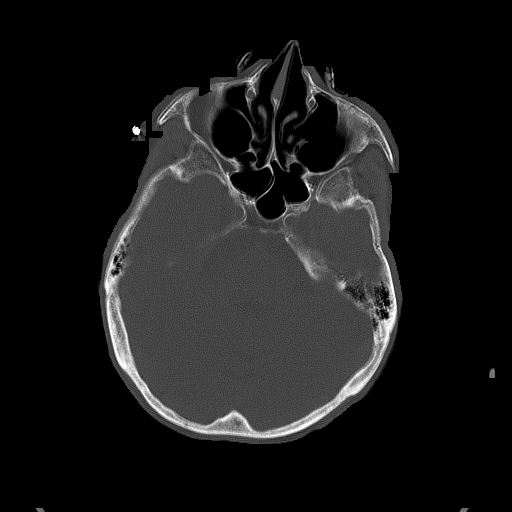
[im 37/87  bone]
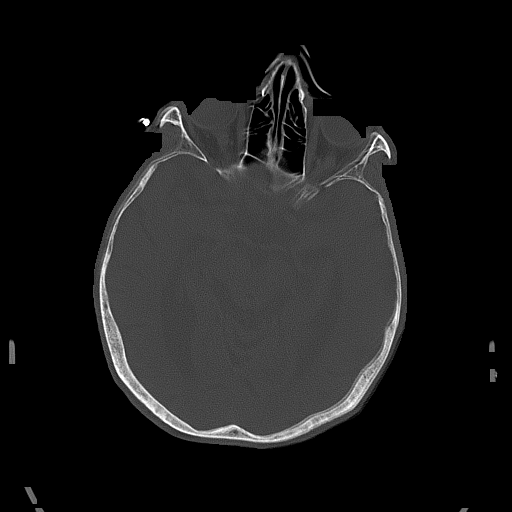

[Series 7: coronal soft tissue · coronal · 0.31mm/px · 3 of 65 slices shown]
[im 22/65  brain]
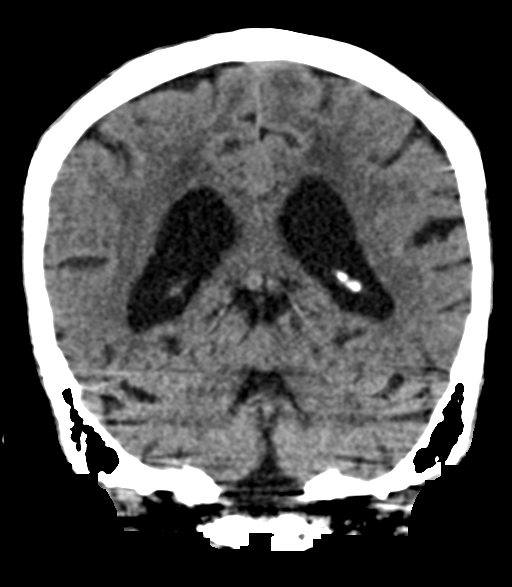
[im 29/65  brain]
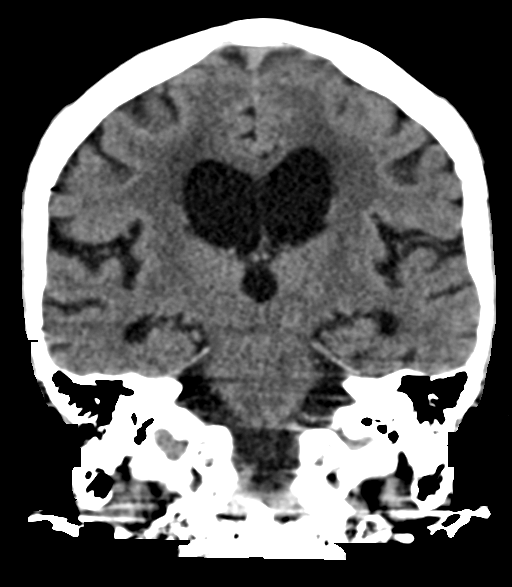
[im 36/65  brain]
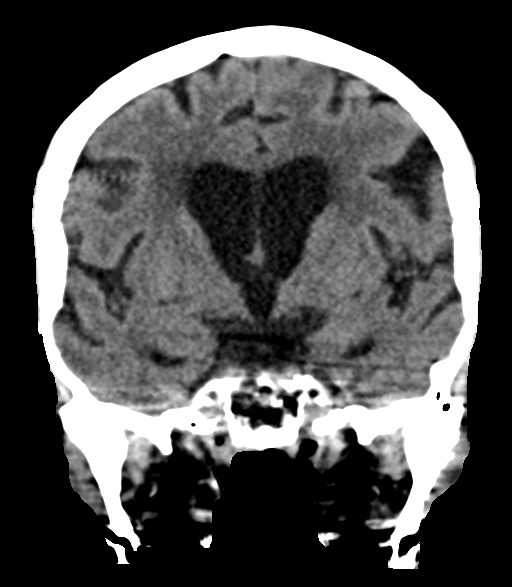

[Series 8: sagittal soft tissue · sagittal · 0.35mm/px · 3 of 51 slices shown]
[im 17/51  brain]
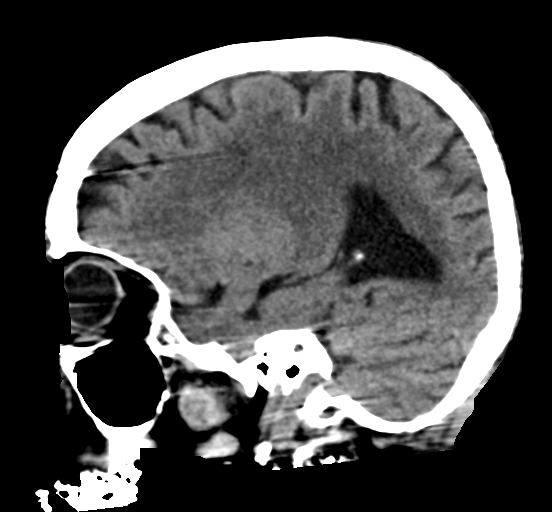
[im 26/51  brain]
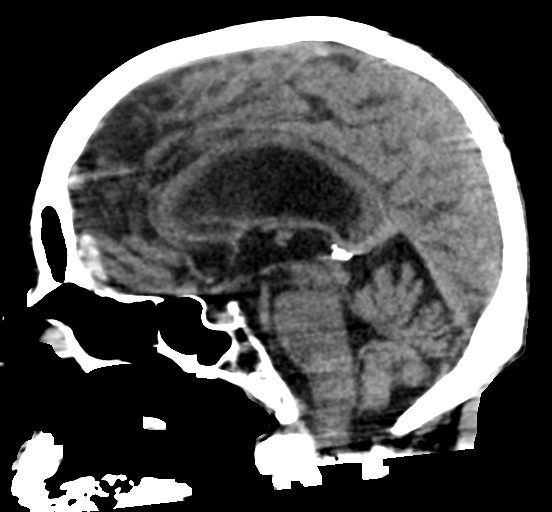
[im 34/51  brain]
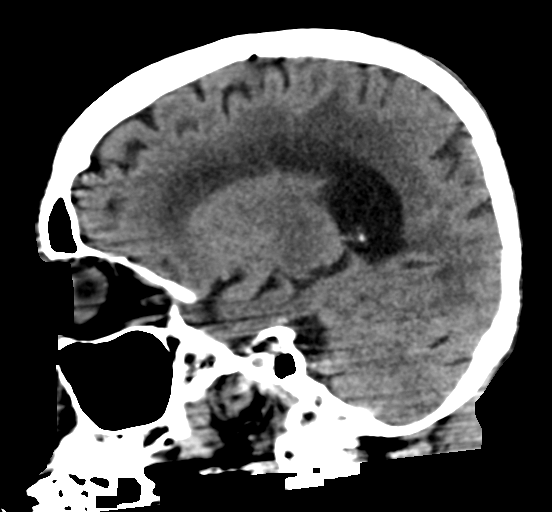

[16 of 47 positions shown; findings below may reference images not displayed]

FINDINGS: Brain: There is no mass, hemorrhage or extra-axial collection. There
is generalized atrophy without lobar predilection. There is
hypoattenuation of the periventricular white matter, most commonly
indicating chronic ischemic microangiopathy.

Vascular: No abnormal hyperdensity of the major intracranial
arteries or dural venous sinuses. No intracranial atherosclerosis.

Skull: The visualized skull base, calvarium and extracranial soft
tissues are normal.

Sinuses/Orbits: No fluid levels or advanced mucosal thickening of
the visualized paranasal sinuses. No mastoid or middle ear effusion.
The orbits are normal.

ASPECTS (Alberta Stroke Program Early CT Score)

- Ganglionic level infarction (caudate, lentiform nuclei, internal
capsule, insula, M1-M3 cortex): 7

- Supraganglionic infarction (M4-M6 cortex): 3

Total score (0-10 with 10 being normal): 10
IMPRESSION: 1. No acute intracranial abnormality.
2. ASPECTS is 10.

These results were called by telephone at the time of interpretation
on [DATE] at [DATE] to provider OSALA , who verbally
acknowledged these results.

## 2022-03-10 MED ORDER — ROCURONIUM BROMIDE 10 MG/ML (PF) SYRINGE
PREFILLED_SYRINGE | INTRAVENOUS | Status: AC
  Administered 2022-03-10: 25 mg
  Filled 2022-03-10: qty 10

## 2022-03-10 MED ORDER — PROPOFOL 1000 MG/100ML IV EMUL
5.0000 ug/kg/min | INTRAVENOUS | Status: DC
  Administered 2022-03-11: 35 ug/kg/min via INTRAVENOUS
  Administered 2022-03-11: 20 ug/kg/min via INTRAVENOUS
  Administered 2022-03-12: 25 ug/kg/min via INTRAVENOUS
  Filled 2022-03-10 (×5): qty 100

## 2022-03-10 MED ORDER — PROPOFOL 1000 MG/100ML IV EMUL
INTRAVENOUS | Status: AC
  Administered 2022-03-10: 30 ug/kg/min via INTRAVENOUS
  Filled 2022-03-10: qty 100

## 2022-03-10 NOTE — ED Triage Notes (Signed)
Patient called EMS for [facility]. RA sats 88%. EMS gave Atrovent, Albuterol, Solumedrol 125, Zofran, and Mg 2gm. EMS states she was A&Ox4 as they entered hospital. When she entered hospital room, she was awake but unresponsive to voice ?

## 2022-03-10 NOTE — Consult Note (Signed)
Chaplain consulted with ED dept regarding Code stroke. No family present at this time. Please call Chaplain if needed.     ?

## 2022-03-10 NOTE — ED Notes (Signed)
Teleneurology notified and this nurse currently speaking with them. ?

## 2022-03-10 NOTE — ED Notes (Signed)
Call placed to contact on file, Amy Vine, message left with return call information.  ?

## 2022-03-10 NOTE — ED Notes (Signed)
Radiology at bedside to do a post intubation film ?

## 2022-03-10 NOTE — ED Notes (Addendum)
Patient returned to room from CT and placed back on monitoring equipment. Patient currently on nasal cannula at 4lpm. Patient still nonresponsive. She is only blinking and breathing noted to be different, almost agonal. Oxygen sats have dropped into the mid 80s despite being on oxygen. Provider immediately notified. Her skin was also noted to be taking on a mottled hue. ?

## 2022-03-10 NOTE — ED Notes (Signed)
EMS arrived to the room with a patient who called for [facility]. Patient was A&Ox4 when they arrived to the hospital and patient was awake when she was wheeled to the room. This nurse noted immediately that she appeared to be staring off into space and did not respond when I snapped my fingers in front of her face or yelled her name. Patient barely responded to pain and her blood pressure was elevated. Patient did not follow commands. Provider was immediately notified to come to bedside. CT was notified and patient will be taken to CT as soon as INT is secured. ?

## 2022-03-10 NOTE — ED Notes (Signed)
X-ray notified for post-intubation film. ?

## 2022-03-10 NOTE — ED Notes (Signed)
Activated Code Stroke w/[facility] 

## 2022-03-10 NOTE — ED Provider Notes (Signed)
? ?[facility] ?Provider Note ? ? ? Event Date/Time  ? First MD Initiated Contact with Patient 03/10/22 2235   ?  (approximate) ? ? ?History  ? ?Code Stroke ? ? ?HPI ? ?Wendy [name] is a 76 y.o. female with a history of CAD, anxiety, hypertension who is brought to the ED by EMS.  Initially they were called for shortness of breath, patient was found to have a room air oxygen saturation of 88%.  They gave Solu-Medrol and bronchodilators along with Zofran and magnesium.  She seemed to be improving, but upon entering the ED they noted an abrupt change in mental status where patient became unresponsive. ? ?She is sitting upright, eyes open, but not interactive.  She does not talk or answer questions or place her attention on anything or anyone in the treatment room.  This is a sudden change occurring at 10:30 PM tonight. ?  ? ? ?Physical Exam  ? ?Triage Vital Signs: ?ED Triage Vitals [03/10/22 2236]  ?Enc Vitals Group  ?   BP   ?   Pulse   ?   Resp   ?   Temp   ?   Temp src   ?   SpO2 91 %  ?   Weight   ?   Height   ?   Head Circumference   ?   Peak Flow   ?   Pain Score   ?   Pain Loc   ?   Pain Edu?   ?   Excl. in [place]?   ? ? ?Most recent vital signs: ?Vitals:  ? 03/10/22 2236  ?BP: (!) 181/113  ?Pulse: (!) 102  ?Resp: (!) 26  ?Temp: 97.7 ?F (36.5 ?C)  ?SpO2: 91%  ? ? ? ?General: Awake, no distress.  ?CV:  Good peripheral perfusion.  Normal peripheral pulses. ?Resp:  Normal effort.  Slight expiratory wheezing.  Symmetric air movement ?Abd:  No distention.  Soft and nontender ?Other:  Trace bilateral lower extremity edema.  Patient unable to participate in neuro exam. ?Plantar reflexes downgoing. ?Patellar reflexes quiet ?Bicep reflexes 2+ bilaterally. ?GCS = E4V1M ? ? ?ED Results / Procedures / Treatments  ? ?Labs ?(all labs ordered are listed, but only abnormal results are displayed) ?Labs Reviewed  ?CBC WITH DIFFERENTIAL/PLATELET - Abnormal; Notable for the following components:  ?    Result  Value  ? WBC 12.8 (*)   ? Neutro Abs 10.1 (*)   ? All other components within normal limits  ?CBG MONITORING, ED - Abnormal; Notable for the following components:  ? Glucose-Capillary 183 (*)   ? All other components within normal limits  ?RESP PANEL BY RT-PCR (FLU A&B, COVID) ARPGX2  ?ETHANOL  ?PROTIME-INR  ?APTT  ?COMPREHENSIVE METABOLIC PANEL  ?URINE DRUG SCREEN, QUALITATIVE ([facility] ONLY)  ?URINALYSIS, ROUTINE W REFLEX MICROSCOPIC  ?TROPONIN I (HIGH SENSITIVITY)  ? ? ? ?EKG ? ?Interpreted by me ?Sinus tachycardia rate 100.  Left axis, normal intervals.  Poor R wave progression.  Normal ST segments.  Slight T wave inversions in high lateral leads, likely related to LVH. ? ? ?RADIOLOGY ?CT head viewed and interpreted by me, no obvious infarct or intracranial hemorrhage.  Radiology report negative.  Discussed with radiologist. ? ?CTA head and neck, CT perfusion study pending ? ?PROCEDURES: ? ?Critical Care performed: Yes, see critical care procedure note(s) ? ?.Critical Care ?Performed by: Carrie Mew, MD ?Authorized by: Carrie Mew, MD  ? ?Critical care provider statement:  ?  Critical care time (minutes):  35 ?  Critical care time was exclusive of:  Separately billable procedures and treating other patients ?  Critical care was necessary to treat or prevent imminent or life-threatening deterioration of the following conditions:  Respiratory failure and CNS failure or compromise ?  Critical care was time spent personally by me on the following activities:  Development of treatment plan with patient or surrogate, discussions with consultants, evaluation of patient's response to treatment, examination of patient, obtaining history from patient or surrogate, ordering and performing treatments and interventions, ordering and review of laboratory studies, ordering and review of radiographic studies, pulse oximetry, re-evaluation of patient's condition and review of old charts ?Comments:  ?    ? ? ?Procedure  Name: Intubation ?Date/Time: 03/10/2022 11:11 PM ?Performed by: Carrie Mew, MD ?Pre-anesthesia Checklist: Patient identified, Emergency Drugs available, Suction available and Patient being monitored ?Oxygen Delivery Method: Ambu bag ?Preoxygenation: Pre-oxygenation with 100% oxygen ?Induction Type: IV induction and Rapid sequence ?Laryngoscope Size: Glidescope and 3 ?Grade View: Grade I ?Tube size: 7.5 mm ?Number of attempts: 1 ?Placement Confirmation: ETT inserted through vocal cords under direct vision, CO2 detector and Breath sounds checked- equal and bilateral ?Secured at: 21 cm ?Tube secured with: ETT holder ?Dental Injury: Teeth and Oropharynx as per pre-operative assessment  ? ? ? ? ? ?MEDICATIONS ORDERED IN ED: ?Medications - No data to display ? ? ?IMPRESSION / MDM / ASSESSMENT AND PLAN / ED COURSE  ?I reviewed the triage vital signs and the nursing notes. ?             ?               ? ?Differential diagnosis includes, but is not limited to, acute stroke, anxiety, hypercapnia, non-STEMI, electrolyte abnormality, UTI, pneumonia ? ?Patient presents to the ED with symptoms of COPD exacerbation with wheezing and mild hypoxia, with acute mental status change on arrival to the ED.  Code stroke initiated.  Initial CT head is negative and was discussed with radiology. ? ? ?----------------------------------------- ?11:09 PM on 03/10/2022 ?----------------------------------------- ?Upon return from CT, patient is having increasing breathing difficulty with shallow respirations.  Combined with her altered mental status and need for further work-up, she was intubated for airway protection.  We will proceed with CT angiogram. ? ?Clinical Course as of 03/11/22 0013  ?Sun Mar 11, 2022  ?0013 Case d/w tele neurology who advises no TNK. Recommend MRI, as well as EEG if mental status does not improve. [PS]  ?  ?Clinical Course User Index ?[PS] Carrie Mew, MD  ? ? ? ?FINAL CLINICAL IMPRESSION(S) / ED DIAGNOSES   ? ?Final diagnoses:  ?Altered mental status, unspecified altered mental status type  ? ? ? ?Rx / DC Orders  ? ?ED Discharge Orders   ? ? None  ? ?  ? ? ? ?Note:  This document was prepared using Dragon voice recognition software and may include unintentional dictation errors. ?  ?Carrie Mew, MD ?03/10/22 2311 ? ?

## 2022-03-11 ENCOUNTER — Emergency Department: Payer: Medicare Other

## 2022-03-11 ENCOUNTER — Inpatient Hospital Stay (HOSPITAL_COMMUNITY)
Admit: 2022-03-11 | Discharge: 2022-03-11 | Disposition: A | Discharge status: Home / Self Care | Payer: Medicare Other | Attending: Pulmonary Disease | Admitting: Pulmonary Disease

## 2022-03-11 ENCOUNTER — Inpatient Hospital Stay: Payer: Medicare Other

## 2022-03-11 ENCOUNTER — Other Ambulatory Visit: Payer: Self-pay

## 2022-03-11 DIAGNOSIS — E871 Hypo-osmolality and hyponatremia: Secondary | ICD-10-CM | POA: Diagnosis present

## 2022-03-11 DIAGNOSIS — R4182 Altered mental status, unspecified: Secondary | ICD-10-CM | POA: Diagnosis not present

## 2022-03-11 DIAGNOSIS — E859 Amyloidosis, unspecified: Secondary | ICD-10-CM | POA: Diagnosis present

## 2022-03-11 DIAGNOSIS — I3139 Other pericardial effusion (noninflammatory): Secondary | ICD-10-CM | POA: Diagnosis present

## 2022-03-11 DIAGNOSIS — I110 Hypertensive heart disease with heart failure: Secondary | ICD-10-CM | POA: Diagnosis present

## 2022-03-11 DIAGNOSIS — G928 Other toxic encephalopathy: Secondary | ICD-10-CM | POA: Diagnosis present

## 2022-03-11 DIAGNOSIS — E785 Hyperlipidemia, unspecified: Secondary | ICD-10-CM | POA: Diagnosis present

## 2022-03-11 DIAGNOSIS — I340 Nonrheumatic mitral (valve) insufficiency: Secondary | ICD-10-CM | POA: Diagnosis not present

## 2022-03-11 DIAGNOSIS — R0602 Shortness of breath: Secondary | ICD-10-CM | POA: Diagnosis present

## 2022-03-11 DIAGNOSIS — E861 Hypovolemia: Secondary | ICD-10-CM | POA: Diagnosis present

## 2022-03-11 DIAGNOSIS — C9000 Multiple myeloma not having achieved remission: Secondary | ICD-10-CM | POA: Diagnosis present

## 2022-03-11 DIAGNOSIS — E8581 Light chain (AL) amyloidosis: Secondary | ICD-10-CM | POA: Diagnosis not present

## 2022-03-11 DIAGNOSIS — J9600 Acute respiratory failure, unspecified whether with hypoxia or hypercapnia: Secondary | ICD-10-CM | POA: Diagnosis present

## 2022-03-11 DIAGNOSIS — E43 Unspecified severe protein-calorie malnutrition: Secondary | ICD-10-CM | POA: Diagnosis present

## 2022-03-11 DIAGNOSIS — Z66 Do not resuscitate: Secondary | ICD-10-CM | POA: Diagnosis present

## 2022-03-11 DIAGNOSIS — D6859 Other primary thrombophilia: Secondary | ICD-10-CM | POA: Diagnosis not present

## 2022-03-11 DIAGNOSIS — G459 Transient cerebral ischemic attack, unspecified: Secondary | ICD-10-CM

## 2022-03-11 DIAGNOSIS — I509 Heart failure, unspecified: Secondary | ICD-10-CM | POA: Diagnosis not present

## 2022-03-11 DIAGNOSIS — Z20822 Contact with and (suspected) exposure to covid-19: Secondary | ICD-10-CM | POA: Diagnosis present

## 2022-03-11 DIAGNOSIS — G8929 Other chronic pain: Secondary | ICD-10-CM | POA: Diagnosis present

## 2022-03-11 DIAGNOSIS — I5033 Acute on chronic diastolic (congestive) heart failure: Secondary | ICD-10-CM | POA: Diagnosis present

## 2022-03-11 DIAGNOSIS — I5021 Acute systolic (congestive) heart failure: Secondary | ICD-10-CM | POA: Diagnosis not present

## 2022-03-11 DIAGNOSIS — I6389 Other cerebral infarction: Secondary | ICD-10-CM | POA: Diagnosis present

## 2022-03-11 DIAGNOSIS — K5900 Constipation, unspecified: Secondary | ICD-10-CM | POA: Diagnosis present

## 2022-03-11 DIAGNOSIS — Z90710 Acquired absence of both cervix and uterus: Secondary | ICD-10-CM | POA: Diagnosis not present

## 2022-03-11 DIAGNOSIS — J9602 Acute respiratory failure with hypercapnia: Secondary | ICD-10-CM | POA: Diagnosis present

## 2022-03-11 DIAGNOSIS — E875 Hyperkalemia: Secondary | ICD-10-CM | POA: Diagnosis present

## 2022-03-11 DIAGNOSIS — I671 Cerebral aneurysm, nonruptured: Secondary | ICD-10-CM | POA: Diagnosis present

## 2022-03-11 DIAGNOSIS — R579 Shock, unspecified: Secondary | ICD-10-CM | POA: Diagnosis present

## 2022-03-11 DIAGNOSIS — I639 Cerebral infarction, unspecified: Secondary | ICD-10-CM

## 2022-03-11 DIAGNOSIS — N170 Acute kidney failure with tubular necrosis: Secondary | ICD-10-CM | POA: Diagnosis present

## 2022-03-11 DIAGNOSIS — G4700 Insomnia, unspecified: Secondary | ICD-10-CM | POA: Diagnosis present

## 2022-03-11 DIAGNOSIS — J9601 Acute respiratory failure with hypoxia: Secondary | ICD-10-CM | POA: Diagnosis present

## 2022-03-11 DIAGNOSIS — Z681 Body mass index (BMI) 19 or less, adult: Secondary | ICD-10-CM | POA: Diagnosis not present

## 2022-03-11 LAB — BLOOD GAS, ARTERIAL
Acid-Base Excess: 1.5 mmol/L (ref 0.0–2.0)
Bicarbonate: 23.7 mmol/L (ref 20.0–28.0)
FIO2: 60 %
MECHVT: 420 mL
Mechanical Rate: 20
O2 Saturation: 93.5 %
PEEP: 5 cmH2O
pCO2 arterial: 41 mmHg (ref 32–48)
pH, Arterial: 7.37 (ref 7.35–7.45)
pO2, Arterial: 65 mmHg — ABNORMAL LOW (ref 83–108)

## 2022-03-11 LAB — ECHOCARDIOGRAM COMPLETE
AR max vel: 1.66 cm2
AV Peak grad: 3.4 mmHg
Ao pk vel: 0.92 m/s
Area-P 1/2: 3.05 cm2
Height: 62 in
S' Lateral: 2.2 cm
Weight: 1700.19 oz

## 2022-03-11 LAB — URINE DRUG SCREEN, QUALITATIVE (ARMC ONLY)
Amphetamines, Ur Screen: NOT DETECTED
Barbiturates, Ur Screen: NOT DETECTED
Benzodiazepine, Ur Scrn: NOT DETECTED
Cannabinoid 50 Ng, Ur ~~LOC~~: NOT DETECTED
Cocaine Metabolite,Ur ~~LOC~~: NOT DETECTED
MDMA (Ecstasy)Ur Screen: NOT DETECTED
Methadone Scn, Ur: NOT DETECTED
Opiate, Ur Screen: NOT DETECTED
Phencyclidine (PCP) Ur S: NOT DETECTED
Tricyclic, Ur Screen: NOT DETECTED

## 2022-03-11 LAB — LIPID PANEL
Cholesterol: 182 mg/dL (ref 0–200)
HDL: 57 mg/dL (ref >40)
LDL Cholesterol: 98 mg/dL (ref 0–99)
Total CHOL/HDL Ratio: 3.2 RATIO
Triglycerides: 134 mg/dL (ref <150)
VLDL: 27 mg/dL (ref 0–40)

## 2022-03-11 LAB — TROPONIN I (HIGH SENSITIVITY)
Troponin I (High Sensitivity): 110 ng/L (ref <18)
Troponin I (High Sensitivity): 129 ng/L (ref <18)
Troponin I (High Sensitivity): 128 ng/L (ref <18)

## 2022-03-11 LAB — URINALYSIS, COMPLETE (UACMP) WITH MICROSCOPIC
Bilirubin Urine: NEGATIVE
Glucose, UA: NEGATIVE mg/dL
Ketones, ur: NEGATIVE mg/dL
Leukocytes,Ua: NEGATIVE
Nitrite: NEGATIVE
Protein, ur: 100 mg/dL — AB
Specific Gravity, Urine: 1.013 (ref 1.005–1.030)
Squamous Epithelial / HPF: NONE SEEN (ref 0–5)
pH: 7 (ref 5.0–8.0)

## 2022-03-11 LAB — BASIC METABOLIC PANEL
Anion gap: 14 (ref 5–15)
BUN: 20 mg/dL (ref 8–23)
CO2: 25 mmol/L (ref 22–32)
Calcium: 9.1 mg/dL (ref 8.9–10.3)
Chloride: 93 mmol/L — ABNORMAL LOW (ref 98–111)
Creatinine, Ser: 0.9 mg/dL (ref 0.44–1.00)
GFR, Estimated: >60 mL/min (ref >60)
Glucose, Bld: 152 mg/dL — ABNORMAL HIGH (ref 70–99)
Potassium: 3.4 mmol/L — ABNORMAL LOW (ref 3.5–5.1)
Sodium: 132 mmol/L — ABNORMAL LOW (ref 135–145)

## 2022-03-11 LAB — CBC
HCT: 38.6 % (ref 36.0–46.0)
Hemoglobin: 12.7 g/dL (ref 12.0–15.0)
MCH: 30.4 pg (ref 26.0–34.0)
MCHC: 32.9 g/dL (ref 30.0–36.0)
MCV: 92.3 fL (ref 80.0–100.0)
Platelets: 370 10*3/uL (ref 150–400)
RBC: 4.18 MIL/uL (ref 3.87–5.11)
RDW: 14.1 % (ref 11.5–15.5)
WBC: 13.8 10*3/uL — ABNORMAL HIGH (ref 4.0–10.5)
nRBC: 0 % (ref 0.0–0.2)

## 2022-03-11 LAB — MRSA NEXT GEN BY PCR, NASAL: MRSA by PCR Next Gen: NOT DETECTED

## 2022-03-11 LAB — GLUCOSE, CAPILLARY
Glucose-Capillary: 131 mg/dL — ABNORMAL HIGH (ref 70–99)
Glucose-Capillary: 115 mg/dL — ABNORMAL HIGH (ref 70–99)
Glucose-Capillary: 114 mg/dL — ABNORMAL HIGH (ref 70–99)
Glucose-Capillary: 106 mg/dL — ABNORMAL HIGH (ref 70–99)
Glucose-Capillary: 106 mg/dL — ABNORMAL HIGH (ref 70–99)

## 2022-03-11 LAB — T4, FREE: Free T4: 2.3 ng/dL — ABNORMAL HIGH (ref 0.61–1.12)

## 2022-03-11 LAB — MAGNESIUM: Magnesium: 2.4 mg/dL (ref 1.7–2.4)

## 2022-03-11 LAB — PHOSPHORUS: Phosphorus: 2.8 mg/dL (ref 2.5–4.6)

## 2022-03-11 LAB — TSH: TSH: 21.68 u[IU]/mL — ABNORMAL HIGH (ref 0.350–4.500)

## 2022-03-11 LAB — BRAIN NATRIURETIC PEPTIDE: B Natriuretic Peptide: 1562 pg/mL — ABNORMAL HIGH (ref 0.0–100.0)

## 2022-03-11 LAB — PROCALCITONIN: Procalcitonin: <0.1 ng/mL

## 2022-03-11 LAB — CBG MONITORING, ED: Glucose-Capillary: 146 mg/dL — ABNORMAL HIGH (ref 70–99)

## 2022-03-11 LAB — TRIGLYCERIDES: Triglycerides: 136 mg/dL (ref <150)

## 2022-03-11 IMAGING — CT CT ANGIO HEAD-NECK (W OR W/O PERF)
2 of 8 series · 10 of 33 positions shown · IV contrast (APPLIED)
Comparison: None.

CLINICAL DATA: Acute neurologic deficit

EXAM:
CT ANGIOGRAPHY HEAD AND NECK
TECHNIQUE: Multidetector CT imaging of the head and neck was performed using
the standard protocol during bolus administration of intravenous
contrast. Multiplanar CT image reconstructions and MIPs were
obtained to evaluate the vascular anatomy. Carotid stenosis
measurements (when applicable) are obtained utilizing NASCET
criteria, using the distal internal carotid diameter as the
denominator.

[Series 7: vpct (id) 5.0 hr36 · axial · 0.41mm/px · z∈[-138,-54]mm · 7 of 946 slices shown]
[im 119/946  soft-tissue]
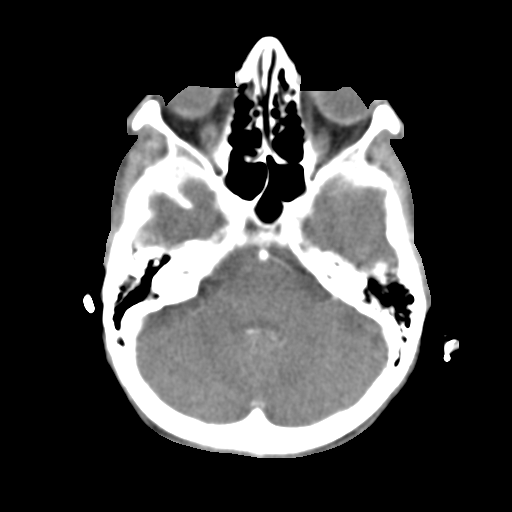
[im 237/946  soft-tissue]
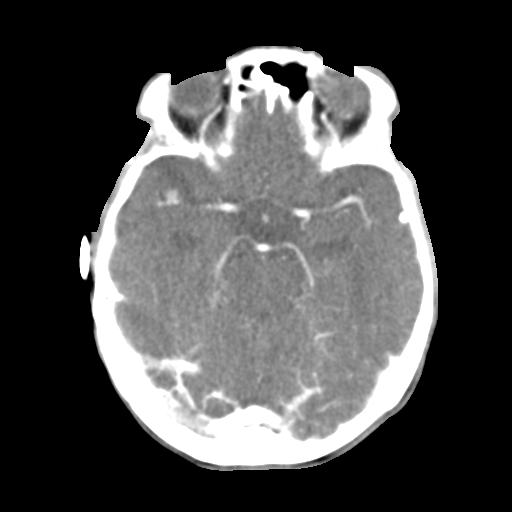
[im 355/946  soft-tissue]
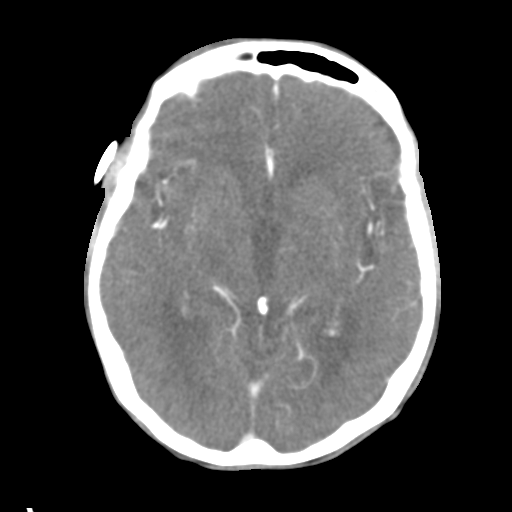
[im 473/946  soft-tissue]
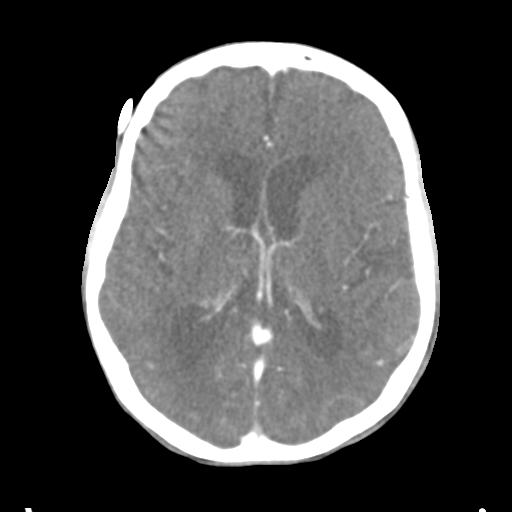
[im 591/946  soft-tissue]
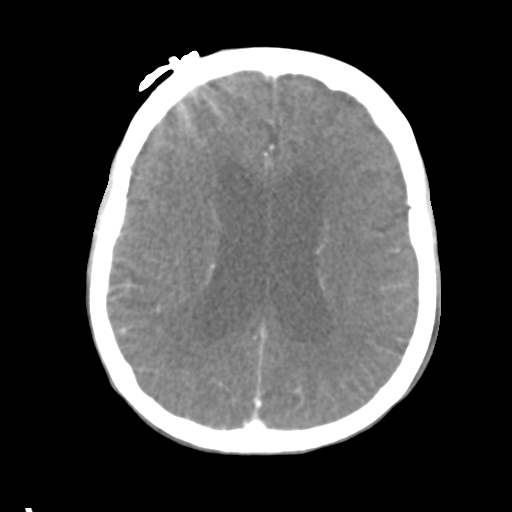
[im 709/946  soft-tissue]
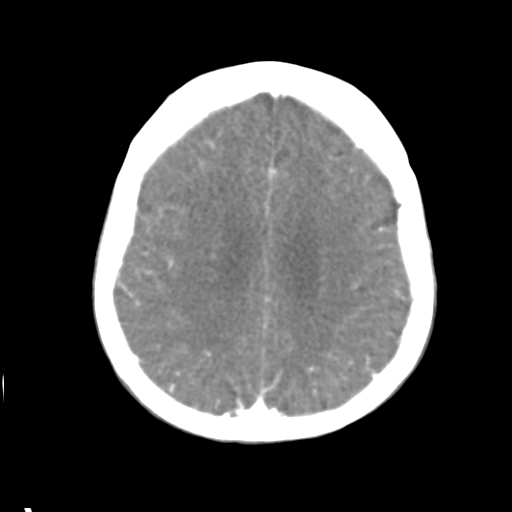
[im 827/946  soft-tissue]
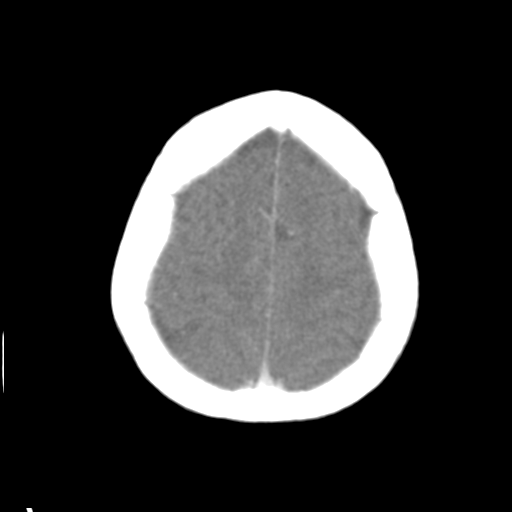

[Series 10: ax thins · axial · 0.39mm/px · z∈[-353,-17]mm · 3 of 338 slices shown]
[im 1/338  soft-tissue]
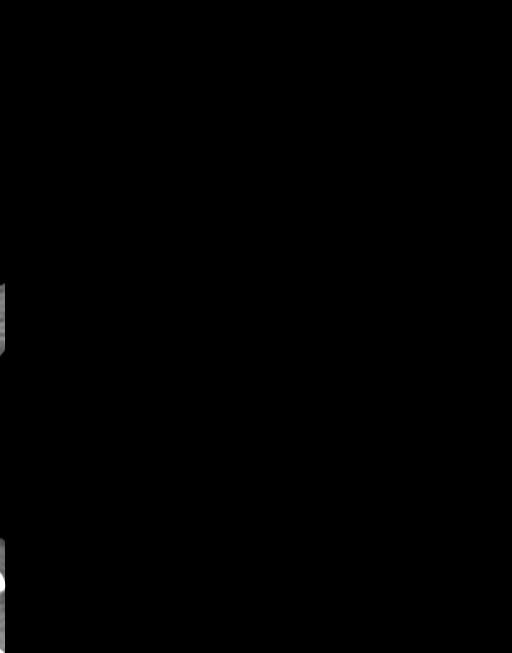
[im 169/338  bone]
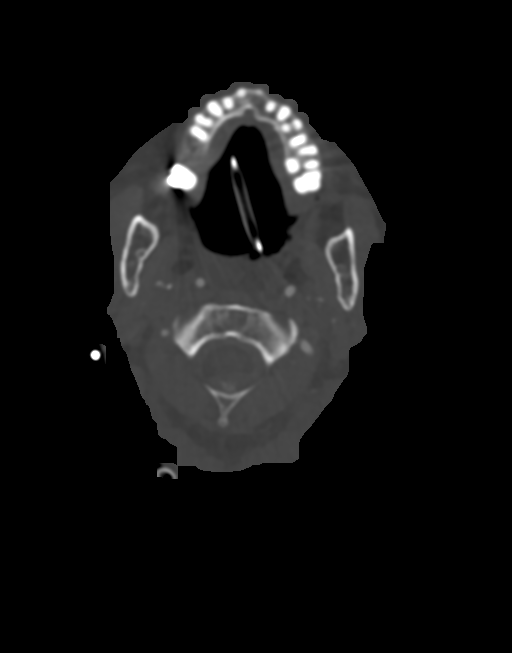
[im 338/338  soft-tissue]
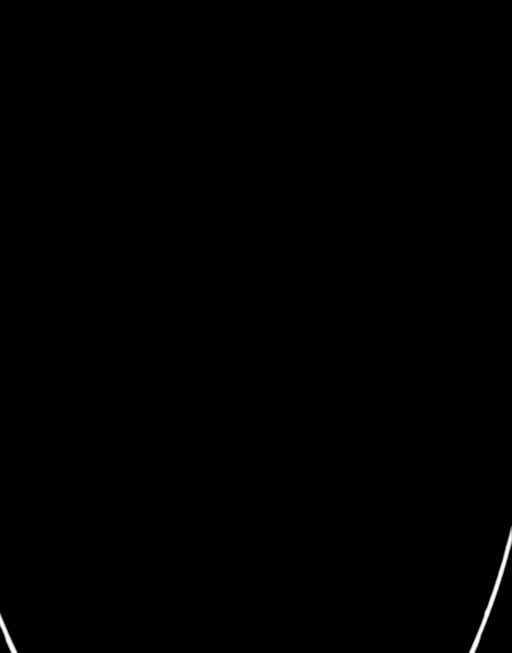

[10 of 33 positions shown; findings below may reference images not displayed]

RADIATION DOSE REDUCTION: This exam was performed according to the
departmental dose-optimization program which includes automated
exposure control, adjustment of the mA and/or kV according to
patient size and/or use of iterative reconstruction technique.

CONTRAST:  130mL OMNIPAQUE IOHEXOL 350 MG/ML SOLN
FINDINGS: CTA NECK FINDINGS

SKELETON: Chronic compression deformities at T1 and T5. Post
augmentation changes at T5.

OTHER NECK: Normal pharynx, larynx and major salivary glands. No
cervical lymphadenopathy. Unremarkable thyroid gland.

UPPER CHEST: Medium-sized pleural effusions. Numerous nodules within
both lungs, too many to count. The largest measures 3 mm.

AORTIC ARCH:

There is calcific atherosclerosis of the aortic arch. There is no
aneurysm, dissection or hemodynamically significant stenosis of the
visualized portion of the aorta. Conventional 3 vessel aortic
branching pattern. The visualized proximal subclavian arteries are
widely patent.

RIGHT CAROTID SYSTEM: No dissection, occlusion or aneurysm. Mild
atherosclerotic calcification at the carotid bifurcation without
hemodynamically significant stenosis.

LEFT CAROTID SYSTEM: Normal without aneurysm, dissection or
stenosis.

VERTEBRAL ARTERIES: Left dominant configuration. Both origins are
clearly patent. There is no dissection, occlusion or flow-limiting
stenosis to the skull base (V1-V3 segments).

CTA HEAD FINDINGS

POSTERIOR CIRCULATION:

--Vertebral arteries: Mild left V4 segment atherosclerotic
calcification.

--Inferior cerebellar arteries: Normal.

--Basilar artery: Normal.

--Superior cerebellar arteries: Normal.

--Posterior cerebral arteries (PCA): Normal.

ANTERIOR CIRCULATION:

--Intracranial internal carotid arteries: Normal.

--Anterior cerebral arteries (ACA): Normal. Both A1 segments are
present. Patent anterior communicating artery (a-comm).

--Middle cerebral arteries (MCA): Right MCA bifurcation aneurysm
measures 5 x 5 mm. Left MCA is normal. No stenosis.

VENOUS SINUSES: As permitted by contrast timing, patent.

ANATOMIC VARIANTS: None

Review of the MIP images confirms the above findings.
IMPRESSION: 1. No emergent large vessel occlusion or hemodynamically significant
stenosis of the intracranial or cervical arteries.
2. 5 x 5 mm right MCA bifurcation aneurysm.
3. Medium-sized pleural effusions with innumerable bilateral
pulmonary nodules, too many to count. Most consistent with
metastatic disease. Please refer to dedicated report for concomitant
CTA chest.

Aortic Atherosclerosis ([QW]-[QW]).

## 2022-03-11 IMAGING — CT CT ANGIO CHEST
3 of 7 series · 17 of 36 positions shown · IV contrast (APPLIED)
Comparison: [DATE]

CLINICAL DATA: Pulmonary embolism (PE) suspected, high prob

EXAM:
CT ANGIOGRAPHY CHEST WITH CONTRAST
TECHNIQUE: Multidetector CT imaging of the chest was performed using the
standard protocol during bolus administration of intravenous
contrast. Multiplanar CT image reconstructions and MIPs were
obtained to evaluate the vascular anatomy.

[Series 4: lung · axial · 0.59mm/px · z∈[-485,-429]mm · 2 of 142 slices shown]
[im 29/142  mediastinal]
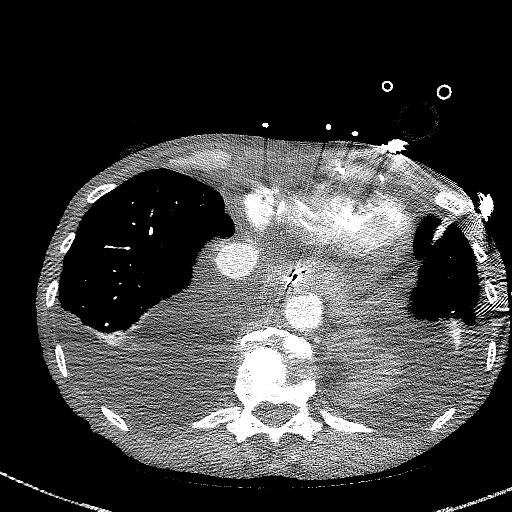
[im 57/142  mediastinal]
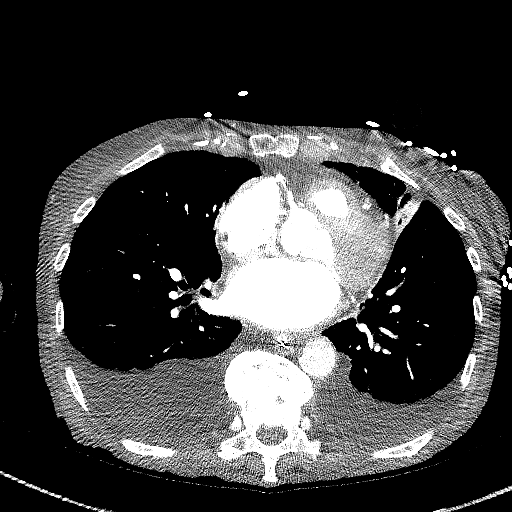

[Series 5: thins · axial · 0.59mm/px · z∈[-523,-278]mm · 14 of 404 slices shown]
[im 27/404  lung]
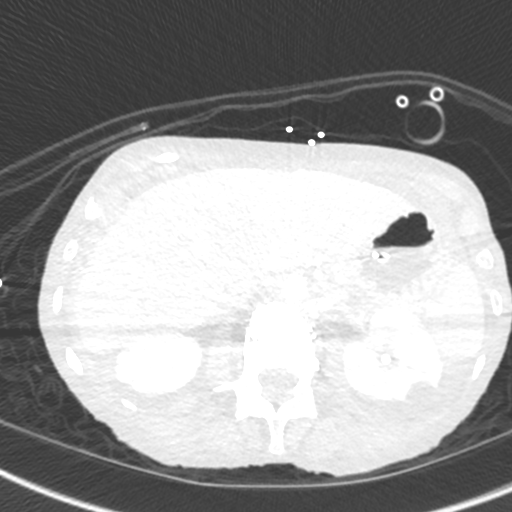
[im 54/404  mediastinal]
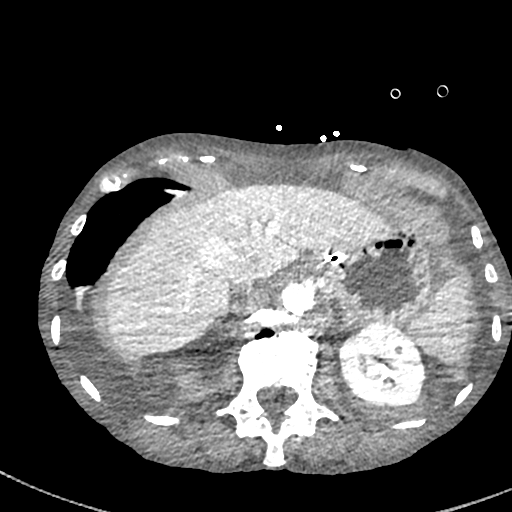
[im 81/404  lung]
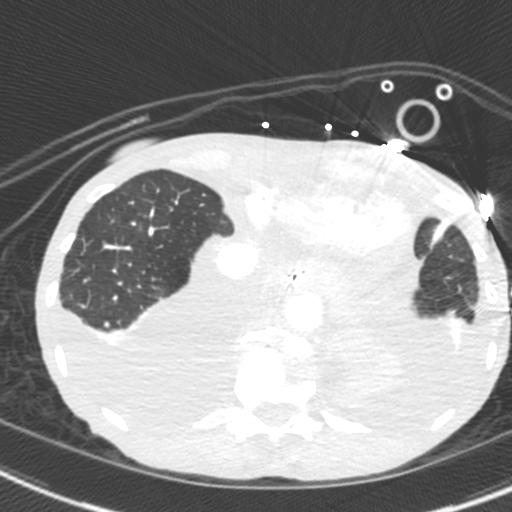
[im 108/404  mediastinal]
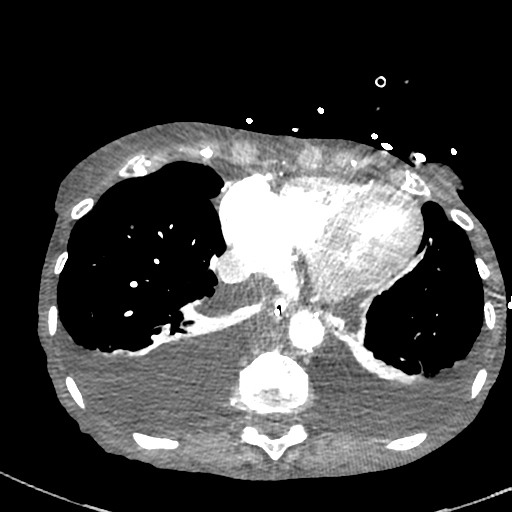
[im 135/404  lung]
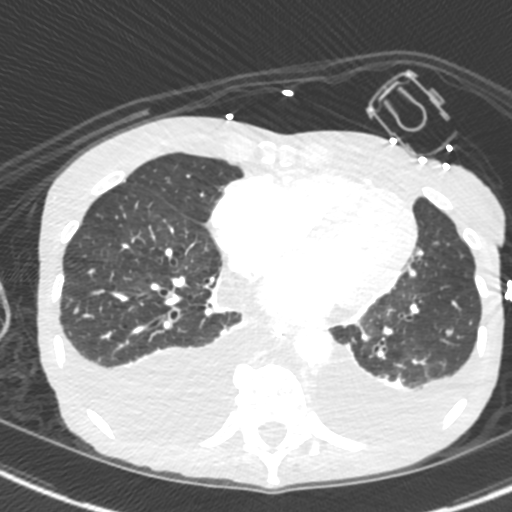
[im 162/404  mediastinal]
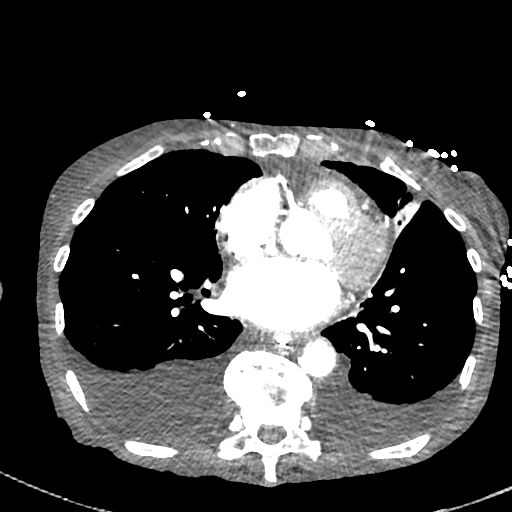
[im 189/404  lung]
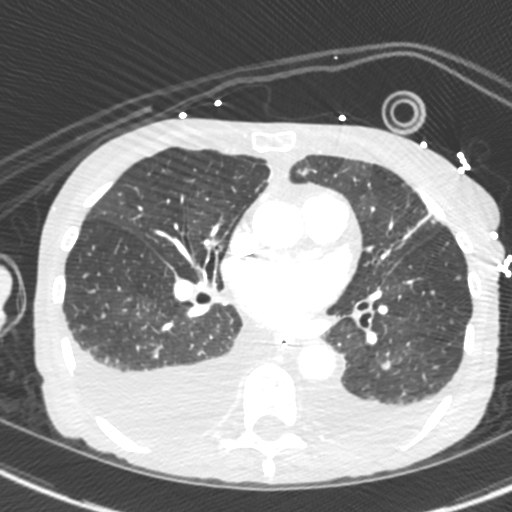
[im 215/404  mediastinal]
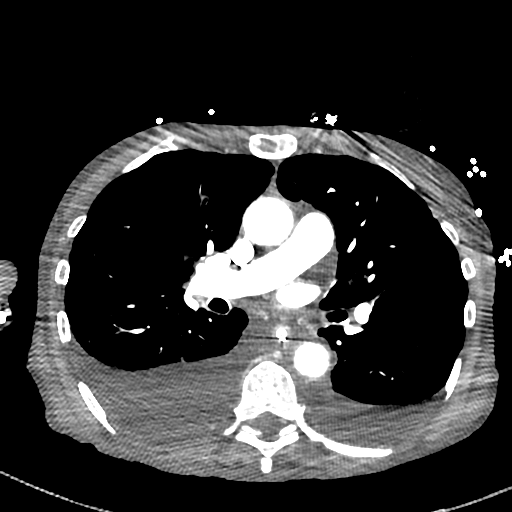
[im 242/404  lung]
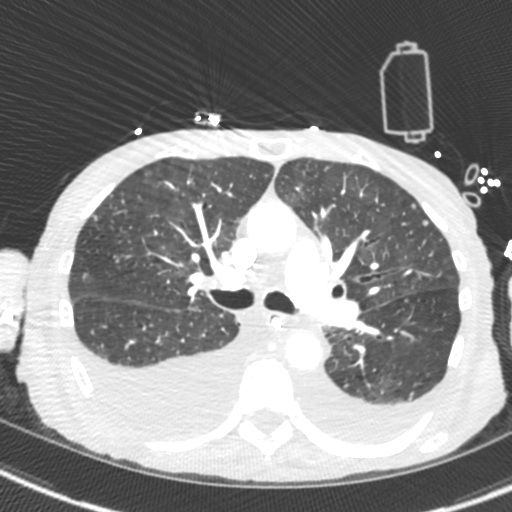
[im 269/404  mediastinal]
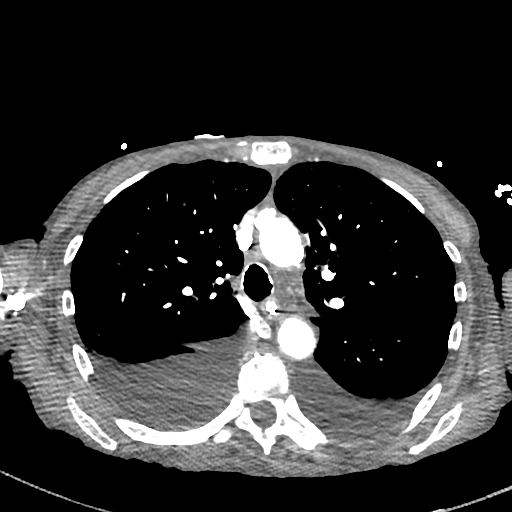
[im 296/404  lung]
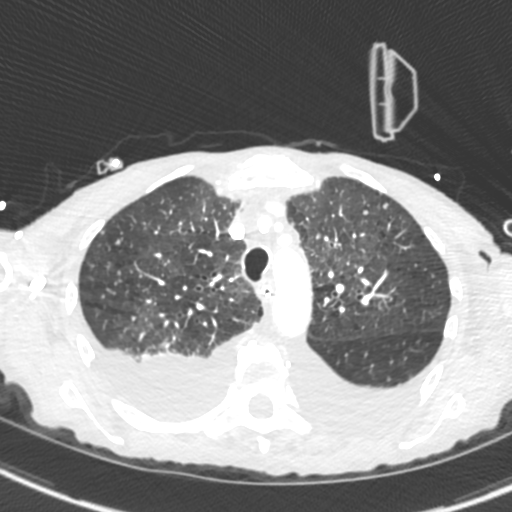
[im 323/404  mediastinal]
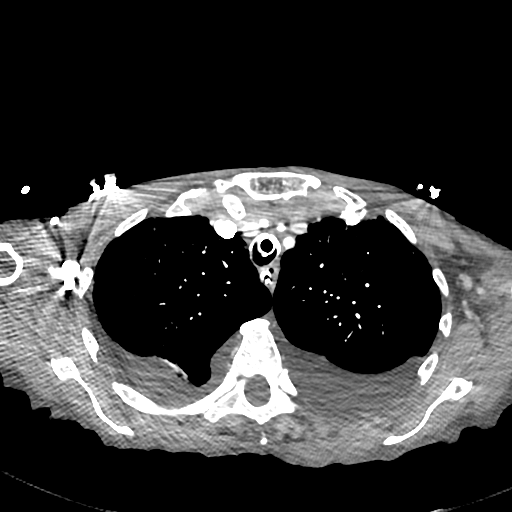
[im 350/404  lung]
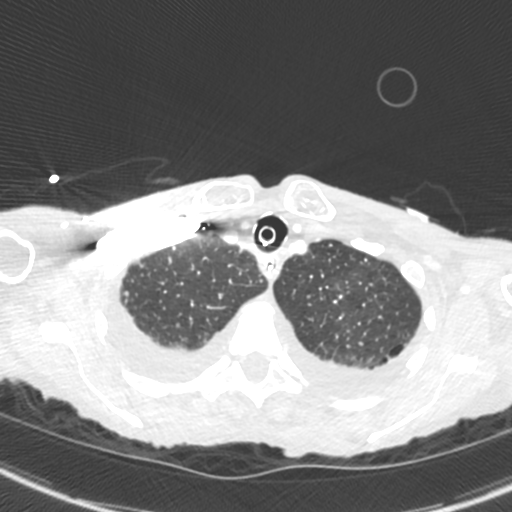
[im 377/404  mediastinal]
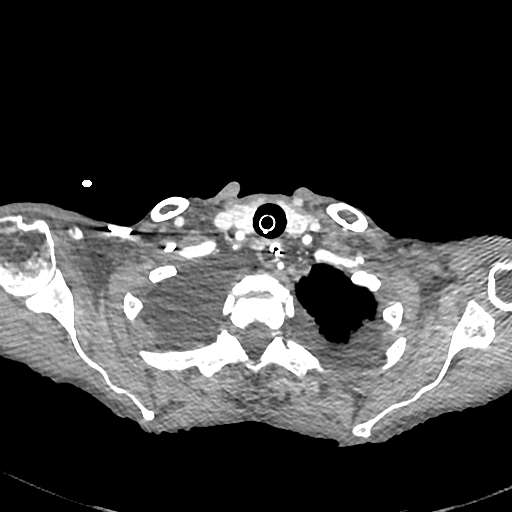

[Series 6: cor · coronal · 0.59mm/px · 1 of 111 slices shown]
[im 56/111  mediastinal]
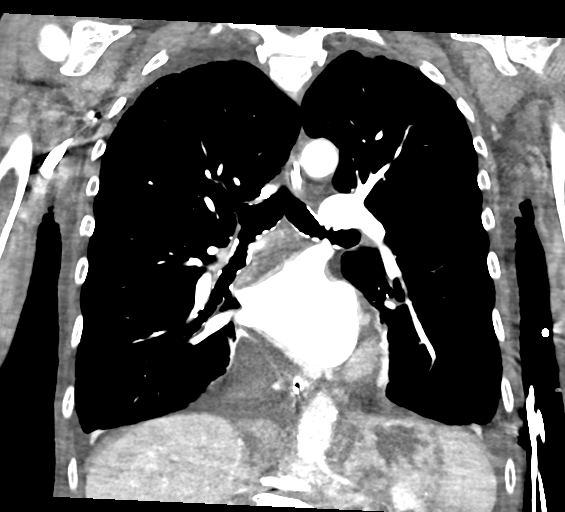

[17 of 36 positions shown; findings below may reference images not displayed]

RADIATION DOSE REDUCTION: This exam was performed according to the
departmental dose-optimization program which includes automated
exposure control, adjustment of the mA and/or kV according to
patient size and/or use of iterative reconstruction technique.

CONTRAST:  130mL OMNIPAQUE IOHEXOL 350 MG/ML SOLN
FINDINGS: Cardiovascular: There is adequate opacification of the pulmonary
arterial tree. No intraluminal filling defect identified to suggest
acute pulmonary embolism. Central pulmonary arteries are of normal
caliber.

Moderate multi-vessel coronary artery calcification. Mild
cardiomegaly with left ventricular hypertrophy. No pericardial
effusion. Mild atherosclerotic calcification within the thoracic
aorta. No aortic aneurysm.

Mediastinum/Nodes: Visualized thyroid is unremarkable. Endotracheal
tube 3.6 cm above the carina. Nasogastric tube extends into the
upper abdomen beyond the margin of the examination. No pathologic
thoracic adenopathy. Esophagus unremarkable.

Lungs/Pleura: Moderate bilateral pleural effusions, enlarged since
prior examination. Interval development of superimposed diffuse
ground-glass pulmonary infiltrate, most in keeping with alveolar
pulmonary edema. Less likely, this may reflect diffuse infectious or
inflammatory infiltrate. Innumerable subcentimeter pulmonary nodules
are again seen diffusely throughout the lungs which are grossly
stable since prior examination. These are nonspecific and may
reflect the sequela of remote infection though less common
considerations could include meningoendotheliomatosis or
neuroendocrine cell hyperplasia. Metastatic disease is considered
unlikely given its stability since remote prior examination of
[DATE]. No pneumothorax. No central obstructing lesion.

Upper Abdomen: Diffuse body wall and retroperitoneal edema has
developed in keeping with anasarca.

Musculoskeletal: Osseous structures are diffusely osteopenic. Severe
T10 compression deformity with vertebral plana configuration and
retropulsion of the posterior wall of the a vertebral body again
identified, unchanged. T5, T12, L1, and L2 vertebroplasty has been
performed. Interval development of a subacute manubrial fracture

Review of the MIP images confirms the above findings.
IMPRESSION: No pulmonary embolism.

Enlarging bilateral pleural effusions, interval development of
diffuse ground-glass pulmonary infiltrate most suggestive of
alveolar pulmonary edema, and development of diffuse body wall and
retroperitoneal edema in keeping with anasarca. Together, these
findings may relate to changes of progressive cardiogenic failure
and echocardiography may be more helpful for further evaluation.

Moderate multi-vessel coronary artery calcification. Mild
cardiomegaly with left ventricular hypertrophy.

Grossly stable diffuse subcentimeter pulmonary nodularity, likely
benign given its stability over time. See differential
considerations above.

Interval development of a subacute manubrial fracture.

Osteopenia with stable changes of a vertebroplasty and severe T10
compression deformity with vertebral plana configuration and
retropulsion of the posterior wall of the vertebral body.

Support tubes in appropriate position

Aortic Atherosclerosis ([W3]-[W3]).

## 2022-03-11 IMAGING — MR MR HEAD WO/W CM
15 series · 48 of 48 positions shown · IV contrast (gadavist)
Comparison: CT head [DATE].

CLINICAL DATA: Transient ischemic attack (TIA) Seizure, new-onset,
no history of trauma Neuro deficit, acute, stroke suspected

EXAM:
MRI HEAD WITHOUT AND WITH CONTRAST
TECHNIQUE: Multiplanar, multiecho pulse sequences of the brain and surrounding
structures were obtained without and with intravenous contrast.
CONTRAST:  7.5mL GADAVIST GADOBUTROL 1 MMOL/ML IV SOLN

[Series 5: ax dwi_tracew · axial · 3.0mm · 1.80mm/px · z∈[-67,+75]mm · 2 of 48 slices shown]
[im 1/48]
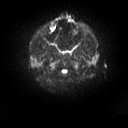
[im 48/48]
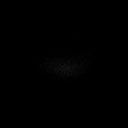

[Series 6: ax dwi_adc · axial · 3.0mm · 1.80mm/px · z∈[-67,+75]mm · 2 of 47 slices shown]
[im 1/47]
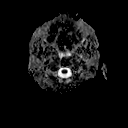
[im 47/47]
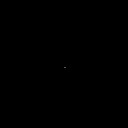

[Series 7: cor dwi_tracew · coronal · 5.0mm · 1.80mm/px · 2 of 36 slices shown]
[im 1/36]
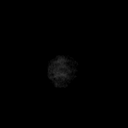
[im 36/36]
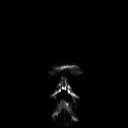

[Series 8: cor dwi_adc · coronal · 5.0mm · 1.80mm/px · 2 of 36 slices shown]
[im 1/36]
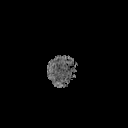
[im 36/36]
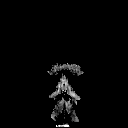

[Series 9: T1 · sagittal · 5.0mm · 0.62mm/px · 1 of 23 slices shown (1 of 2)]
[im 1/23]
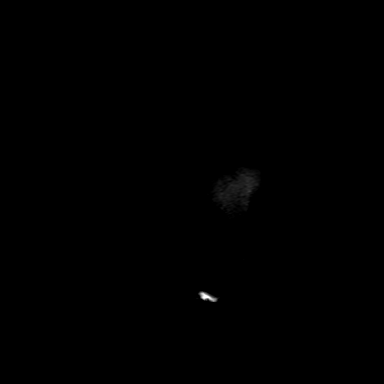

[Series 10: T2 · axial · 5.0mm · 0.86mm/px · 1 of 27 slices shown (1 of 2)]
[im 1/27]
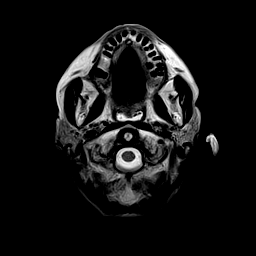

[Series 12: pha_images · axial · 3.0mm · 0.90mm/px · z∈[-62,+78]mm · 3 of 52 slices shown]
[im 1/52]
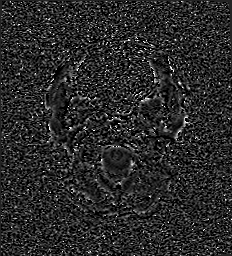
[im 26/52]
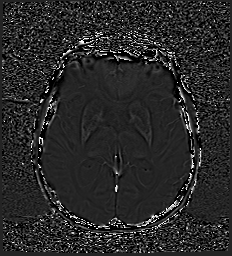
[im 52/52]
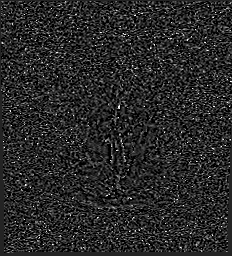

[Series 13: swi_images · axial · 3.0mm · 0.90mm/px · z∈[-62,+78]mm · 3 of 52 slices shown]
[im 1/52]
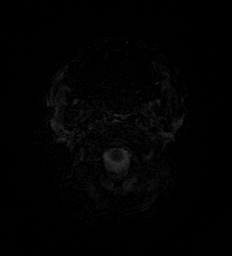
[im 26/52]
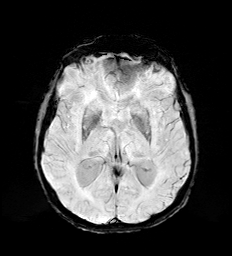
[im 52/52]
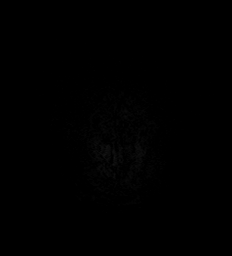

[Series 15: FLAIR · axial · 3.0mm · 0.69mm/px · z∈[-69,+80]mm · 3 of 55 slices shown (1 of 2)]
[im 1/55]
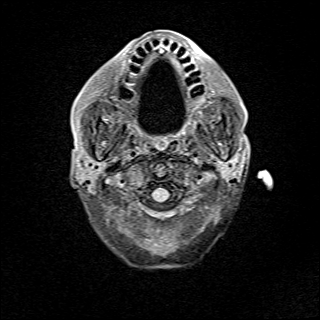
[im 28/55]
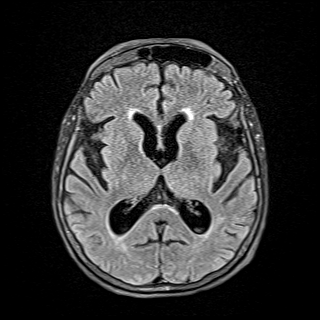
[im 55/55]
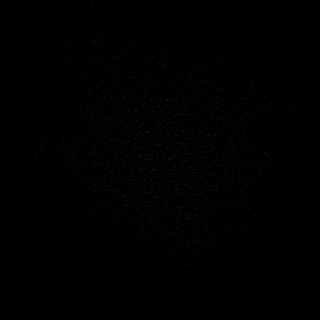

[Series 16: T1 · axial · 1.0mm · 0.98mm/px · z∈[-65,+81]mm · 9 of 160 slices shown (2 of 2)]
[im 1/160]
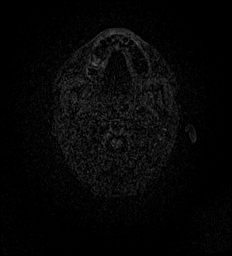
[im 20/160]
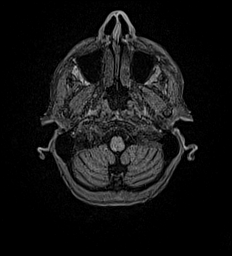
[im 40/160]
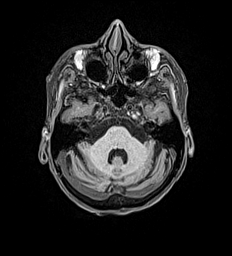
[im 60/160]
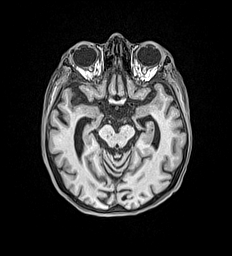
[im 80/160]
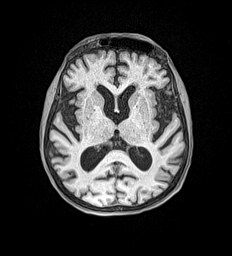
[im 100/160]
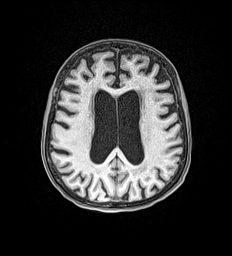
[im 120/160]
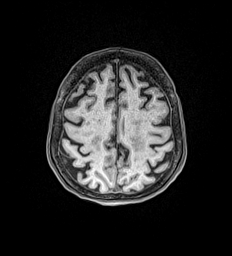
[im 140/160]
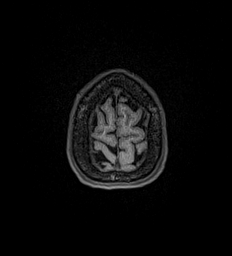
[im 160/160]
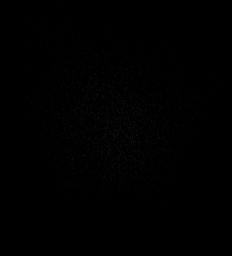

[Series 17: T2 · coronal · 3.0mm · 0.47mm/px · 2 of 35 slices shown (2 of 2)]
[im 1/35]
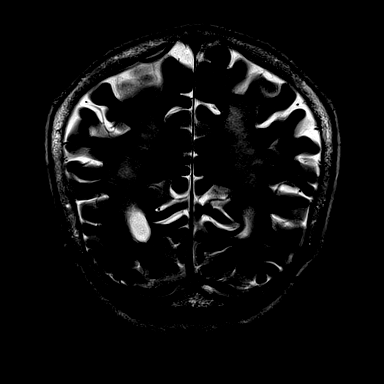
[im 35/35]
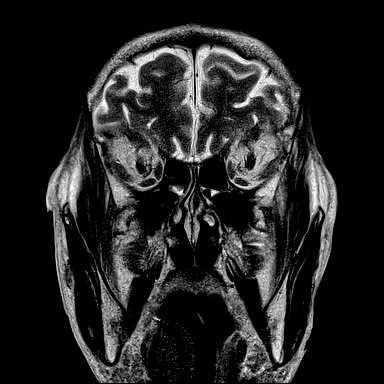

[Series 18: FLAIR · coronal · 3.0mm · 0.47mm/px · 2 of 35 slices shown (2 of 2)]
[im 1/35]
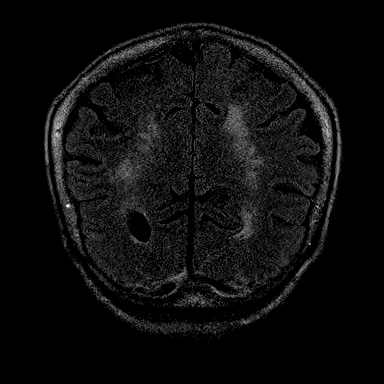
[im 35/35]
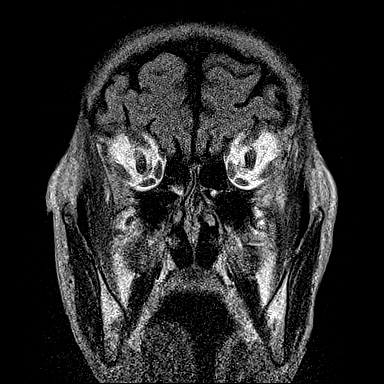

[Series 19: T1 post-contrast · axial · 1.0mm · 0.98mm/px · z∈[-65,+81]mm · 9 of 160 slices shown (1 of 2)]
[im 1/160]
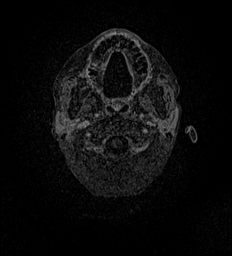
[im 20/160]
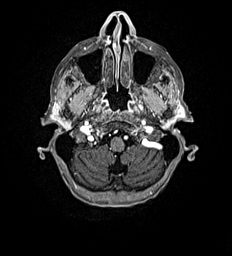
[im 40/160]
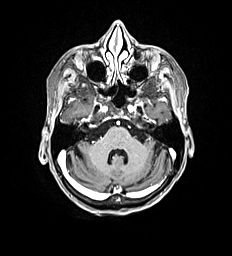
[im 60/160]
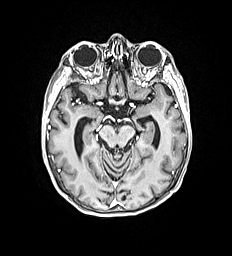
[im 80/160]
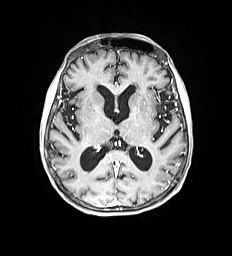
[im 100/160]
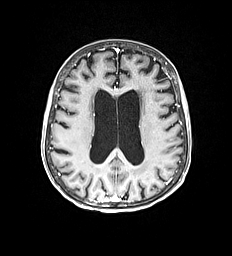
[im 120/160]
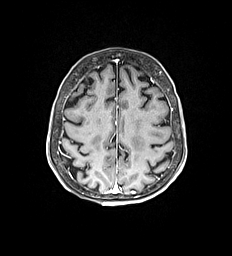
[im 140/160]
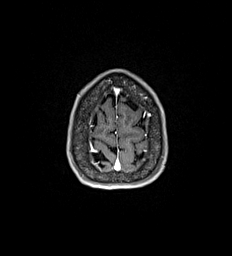
[im 160/160]
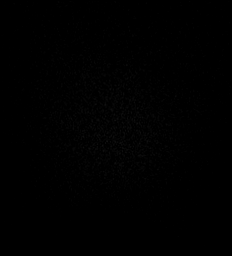

[Series 20: T1 post-contrast · coronal · 5.0mm · 0.90mm/px · 1 of 28 slices shown (2 of 2)]
[im 1/28]
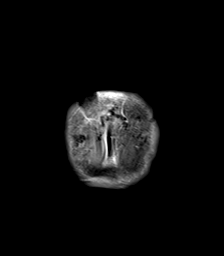

[Series 1019: cor thins rl · coronal · 3.0mm · 0.44mm/px · 6 of 107 slices shown]
[im 1/107]
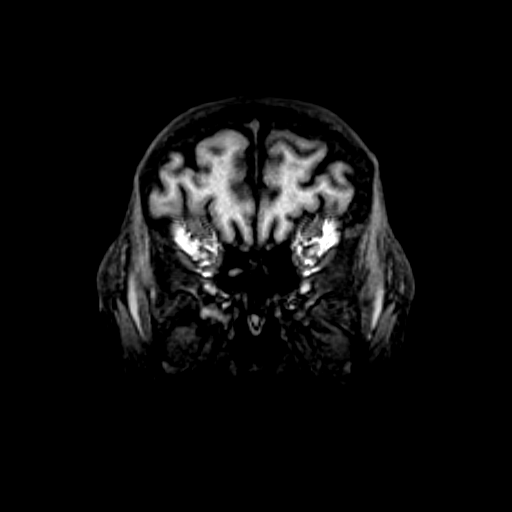
[im 22/107]
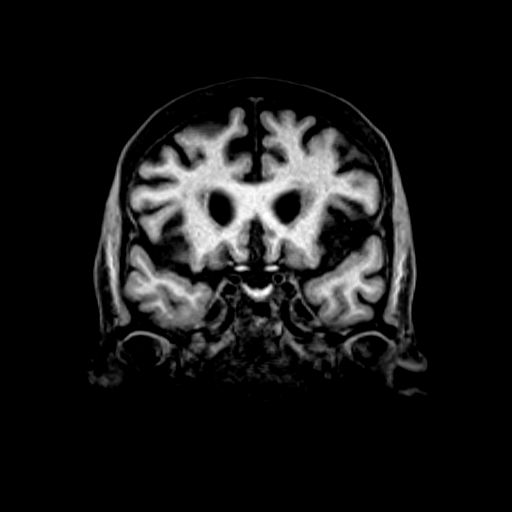
[im 43/107]
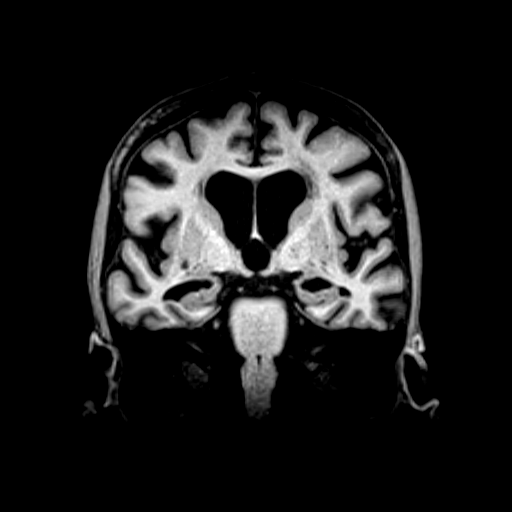
[im 64/107]
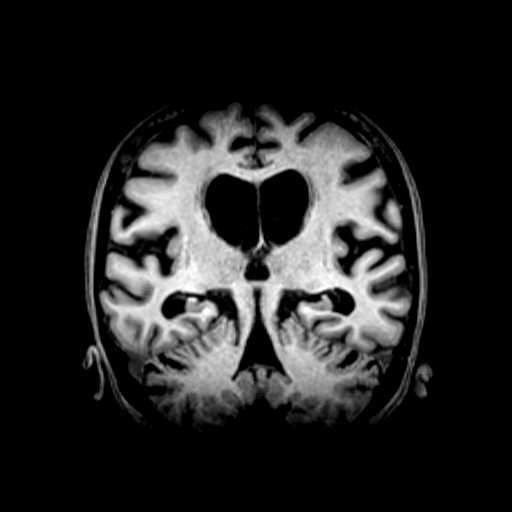
[im 85/107]
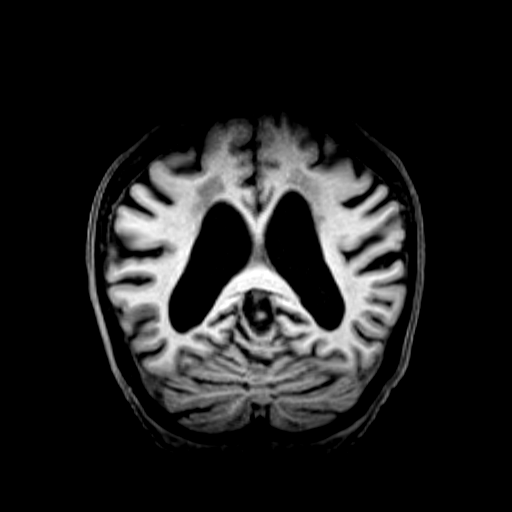
[im 107/107]
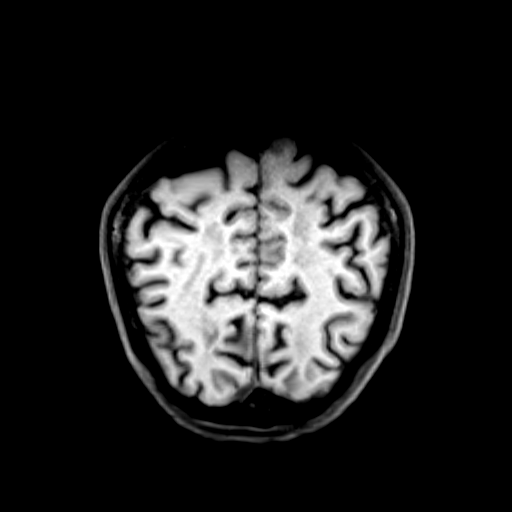

[48 of 48 positions shown; findings below may reference images not displayed]

FINDINGS: Brain: Multiple small acute infarcts within the high bilateral
frontal and parietal lobes. Mild-to-moderate areas of patchy
T2/FLAIR hyperintensity within the white matter, nonspecific but
compatible with chronic microvascular ischemic disease. No evidence
of acute hemorrhage, hydrocephalus, mass lesion, midline shift.
Cerebral atrophy with ex vacuo ventricular dilation. No pathologic
enhancement.

Vascular: Major arterial flow voids are maintained at the skull
base. Right MCA bifurcation aneurysm better characterized on recent
CTA.

Skull and upper cervical spine: Mildly heterogeneous marrow without
definite suspicious lesion.

Sinuses/Orbits: Largely clear sinuses.  No acute orbital findings.

Other: No mastoid effusions.
IMPRESSION: 1. Multiple small acute infarcts in the high frontal and parietal
lobes bilaterally.
2. Chronic microvascular disease and atrophy.

These results will be called to the ordering clinician or
representative by the Radiologist Assistant, and communication
documented in the PACS or [REDACTED].

These results will be called to the ordering clinician or
representative by the Radiologist Assistant, and communication
documented in the PACS or [REDACTED].

## 2022-03-11 MED ORDER — MIDAZOLAM HCL 2 MG/2ML IJ SOLN: 1.0000 mg | INTRAMUSCULAR | Status: DC | PRN

## 2022-03-11 MED ORDER — NOREPINEPHRINE 4 MG/250ML-% IV SOLN
2.0000 ug/min | INTRAVENOUS | Status: DC
  Administered 2022-03-11: 2 ug/min via INTRAVENOUS
  Administered 2022-03-12: 5 ug/min via INTRAVENOUS
  Administered 2022-03-13: 4 ug/min via INTRAVENOUS
  Administered 2022-03-14: 2 ug/min via INTRAVENOUS
  Filled 2022-03-11 (×3): qty 250

## 2022-03-11 MED ORDER — FUROSEMIDE 10 MG/ML IJ SOLN
20.0000 mg | Freq: Once | INTRAMUSCULAR | Status: AC
  Administered 2022-03-11: 20 mg via INTRAVENOUS

## 2022-03-11 MED ORDER — ENOXAPARIN SODIUM 40 MG/0.4ML IJ SOSY
40.0000 mg | PREFILLED_SYRINGE | INTRAMUSCULAR | Status: DC
  Administered 2022-03-11: 40 mg via SUBCUTANEOUS
  Filled 2022-03-11: qty 0.4

## 2022-03-11 MED ORDER — PANTOPRAZOLE 2 MG/ML SUSPENSION
40.0000 mg | Freq: Every day | ORAL | Status: DC
  Administered 2022-03-11 – 2022-03-14 (×4): 40 mg
  Filled 2022-03-11 (×4): qty 20

## 2022-03-11 MED ORDER — ACETAMINOPHEN 325 MG PO TABS: 650.0000 mg | ORAL_TABLET | Freq: Four times a day (QID) | ORAL | Status: DC | PRN

## 2022-03-11 MED ORDER — POTASSIUM CHLORIDE 20 MEQ PO PACK
40.0000 meq | PACK | Freq: Once | ORAL | Status: AC
  Administered 2022-03-11: 40 meq
  Filled 2022-03-11: qty 2

## 2022-03-11 MED ORDER — FUROSEMIDE 10 MG/ML IJ SOLN
80.0000 mg | Freq: Once | INTRAMUSCULAR | Status: AC
Start: 2022-03-11 — End: 2022-03-11
  Administered 2022-03-11: 80 mg via INTRAVENOUS
  Filled 2022-03-11: qty 8

## 2022-03-11 MED ORDER — FENTANYL CITRATE PF 50 MCG/ML IJ SOSY
50.0000 ug | PREFILLED_SYRINGE | Freq: Once | INTRAMUSCULAR | Status: AC
  Administered 2022-03-11: 50 ug via INTRAVENOUS
  Filled 2022-03-11: qty 1

## 2022-03-11 MED ORDER — POLYETHYLENE GLYCOL 3350 17 G PO PACK: 17.0000 g | PACK | Freq: Every day | ORAL | Status: DC | PRN

## 2022-03-11 MED ORDER — STROKE: EARLY STAGES OF RECOVERY BOOK: Freq: Once | Status: AC

## 2022-03-11 MED ORDER — FUROSEMIDE 10 MG/ML IJ SOLN
INTRAMUSCULAR | Status: AC
  Filled 2022-03-11: qty 2

## 2022-03-11 MED ORDER — ALBUTEROL SULFATE (2.5 MG/3ML) 0.083% IN NEBU: 2.5000 mg | INHALATION_SOLUTION | RESPIRATORY_TRACT | Status: DC | PRN

## 2022-03-11 MED ORDER — MIDAZOLAM HCL 2 MG/2ML IJ SOLN
1.0000 mg | INTRAMUSCULAR | Status: DC | PRN
  Filled 2022-03-11: qty 2

## 2022-03-11 MED ORDER — FENTANYL 2500MCG IN NS 250ML (10MCG/ML) PREMIX INFUSION
25.0000 ug/h | INTRAVENOUS | Status: DC
  Administered 2022-03-11: 25 ug/h via INTRAVENOUS
  Administered 2022-03-12 – 2022-03-14 (×4): 150 ug/h via INTRAVENOUS
  Filled 2022-03-11 (×5): qty 250

## 2022-03-11 MED ORDER — ORAL CARE MOUTH RINSE
15.0000 mL | OROMUCOSAL | Status: DC
  Administered 2022-03-11 – 2022-03-15 (×45): 15 mL via OROMUCOSAL

## 2022-03-11 MED ORDER — INSULIN ASPART 100 UNIT/ML IJ SOLN
0.0000 [IU] | INTRAMUSCULAR | Status: DC
  Administered 2022-03-11: 2 [IU] via SUBCUTANEOUS
  Filled 2022-03-11: qty 1

## 2022-03-11 MED ORDER — POTASSIUM CHLORIDE 20 MEQ PO PACK
40.0000 meq | PACK | Freq: Once | ORAL | Status: AC
Start: 2022-03-11 — End: 2022-03-11
  Administered 2022-03-11: 40 meq
  Filled 2022-03-11: qty 2

## 2022-03-11 MED ORDER — POTASSIUM CHLORIDE 20 MEQ PO PACK: 20.0000 meq | PACK | Freq: Two times a day (BID) | ORAL | Status: DC

## 2022-03-11 MED ORDER — CHLORHEXIDINE GLUCONATE CLOTH 2 % EX PADS
6.0000 | MEDICATED_PAD | Freq: Every day | CUTANEOUS | Status: DC
  Administered 2022-03-11 – 2022-03-17 (×7): 6 via TOPICAL

## 2022-03-11 MED ORDER — FENTANYL BOLUS VIA INFUSION
25.0000 ug | INTRAVENOUS | Status: DC | PRN
  Administered 2022-03-11: 25 ug via INTRAVENOUS
  Administered 2022-03-11: 50 ug via INTRAVENOUS
  Filled 2022-03-11: qty 100

## 2022-03-11 MED ORDER — IOHEXOL 350 MG/ML SOLN
130.0000 mL | Freq: Once | INTRAVENOUS | Status: AC | PRN
  Administered 2022-03-11: 130 mL via INTRAVENOUS

## 2022-03-11 MED ORDER — GADOBUTROL 1 MMOL/ML IV SOLN
5.0000 mL | Freq: Once | INTRAVENOUS | Status: AC | PRN
  Administered 2022-03-11: 7.5 mL via INTRAVENOUS

## 2022-03-11 MED ORDER — DOCUSATE SODIUM 100 MG PO CAPS: 100.0000 mg | ORAL_CAPSULE | Freq: Two times a day (BID) | ORAL | Status: DC | PRN

## 2022-03-11 MED ORDER — NOREPINEPHRINE 4 MG/250ML-% IV SOLN
INTRAVENOUS | Status: AC
  Filled 2022-03-11: qty 250

## 2022-03-11 MED ORDER — FENTANYL CITRATE PF 50 MCG/ML IJ SOSY: 25.0000 ug | PREFILLED_SYRINGE | Freq: Once | INTRAMUSCULAR | Status: DC

## 2022-03-11 MED ORDER — ROSUVASTATIN CALCIUM 10 MG PO TABS
10.0000 mg | ORAL_TABLET | Freq: Every day | ORAL | Status: DC
  Administered 2022-03-11 – 2022-03-12 (×2): 10 mg
  Filled 2022-03-11 (×2): qty 1

## 2022-03-11 MED ORDER — SODIUM CHLORIDE 0.9 % IV SOLN: 250.0000 mL | INTRAVENOUS | Status: DC

## 2022-03-11 MED ORDER — DOCUSATE SODIUM 50 MG/5ML PO LIQD
100.0000 mg | Freq: Two times a day (BID) | ORAL | Status: DC
  Administered 2022-03-11 – 2022-03-14 (×7): 100 mg
  Filled 2022-03-11 (×7): qty 10

## 2022-03-11 MED ORDER — CHLORHEXIDINE GLUCONATE 0.12% ORAL RINSE (MEDLINE KIT)
15.0000 mL | Freq: Two times a day (BID) | OROMUCOSAL | Status: DC
  Administered 2022-03-11 – 2022-03-15 (×9): 15 mL via OROMUCOSAL

## 2022-03-11 MED ORDER — POLYETHYLENE GLYCOL 3350 17 G PO PACK
17.0000 g | PACK | Freq: Every day | ORAL | Status: DC
  Administered 2022-03-12 – 2022-03-14 (×3): 17 g
  Filled 2022-03-11 (×3): qty 1

## 2022-03-11 MED ORDER — CLOPIDOGREL BISULFATE 75 MG PO TABS
75.0000 mg | ORAL_TABLET | Freq: Every day | ORAL | Status: DC
Start: 2022-03-11 — End: 2022-03-15
  Administered 2022-03-11 – 2022-03-14 (×4): 75 mg
  Filled 2022-03-11 (×4): qty 1

## 2022-03-11 NOTE — Progress Notes (Signed)
eLink Physician-Brief Progress Note ?Patient Name: Wendy [name]. [name] ?DOB: 02-04-1946 ?MRN: [id number] ? ? ?Date of Service ? 03/11/2022  ?HPI/Events of Note ? Patient admitted with altered mental status, and acute hypoxemic respiratory failure, intubated in the ED, work up shows pulmonary edema suggestive of CHF, neuro work up negative for ischemic stroke so far but does show an incidental aneurysm.  ?eICU Interventions ? New Patient Evaluation.  ? ? ? ?  ? ?Frederik Pear ?03/11/2022, 3:40 AM ?

## 2022-03-11 NOTE — Progress Notes (Signed)
*  PRELIMINARY RESULTS* ?Echocardiogram ?2D Echocardiogram has been performed. ? ?Wendy [name] ?03/11/2022, 12:09 PM ?

## 2022-03-11 NOTE — Progress Notes (Signed)
Patient noted to have decreased urine output second half of the shift, Dr. Patsey Berthold notified of finding and ordered to give '20mg'$  Lasix ? ?

## 2022-03-11 NOTE — ED Notes (Signed)
Family is leaving for the night. Belongings have been given to them to take home including two pill bottles and an inhaler. Patient resting comfortably at this time. RR even and unlabored. Patient does not indicate that she is in any pain. ?

## 2022-03-11 NOTE — Progress Notes (Signed)
2348-code stroke activated, pt has already been to CT ?0002-Dr. Maryan Rued joined teleneuro cart ?

## 2022-03-11 NOTE — Progress Notes (Signed)
Received [facility] consult for PIV start for Vasopressor. Assessed patient's bilateral forearms with ultrasound. No suitable veins found to place PIV with ultrasound. Patient currently has working 18g PIV in left A/C with Vasopressor infusing. IVwatch in place. Placed PIV without ultrasound for additional IV medication. Primary RN Claiborne Billings aware. ?

## 2022-03-11 NOTE — Progress Notes (Signed)
[facility] ADULT ICU REPLACEMENT PROTOCOL ? ? ?The patient does apply for the [facility] Adult ICU Electrolyte Replacment Protocol based on the criteria listed below:  ? ?1.Exclusion criteria: TCTS patients, ECMO patients, and Dialysis patients ?2. Is GFR >/= 30 ml/min? Yes.    ?Patient's GFR today is >60 ?3. Is SCr </= 2? Yes.   ?Patient's SCr is 0.90 mg/dL ?4. Did SCr increase >/= 0.5 in 24 hours? No. ?5.Pt's weight >40kg  Yes.   ?6. Abnormal electrolyte(s): K  ?7. Electrolytes replaced per protocol ?8.  Call MD STAT for K+ </= 2.5, Phos </= 1, or Mag </= 1 ?Physician:  Lucile Shutters ? ?Vanilla Heatherington E Quinci Gavidia 03/11/2022 5:05 AM  ?

## 2022-03-11 NOTE — ED Notes (Signed)
Returned from [place]. Agonal respiration noted. Provider called emergently to bedside. Pt bagged at this time. RT notified.  ?

## 2022-03-11 NOTE — Consult Note (Addendum)
[facility] Tele[facility]urology Consult Services ? ? ?Patient Name:   Wendy [name], Wendy [name] ?Date of Birth:   1946/11/13 ?Identification Number:   MRN - [id number] ?Date of Service:   03/10/2022 23:52:06 ? ?Diagnosis: ?      R41.82 - AMS (Altered Mental Status) ? ?Impression: ?     Stroke alert activated for altered mental status and unresponsiveness. No lateralizing symptoms or deficits. Last known normal unclear at this time. No thrombolytics due to no clear focal symptoms to suggest acute ischemic stroke as well as unclear last known well. Advanced cerebral imaging pending. We will follow-up on results. If evidence of LVO could reconsider decision for possible thrombolytics. Otherwise, recommend cardiac and tox metabolic work-up per the ER. If no clear etiology found consider EEG. She will need MRI brain without contrast when clinically stable to rule out acute CVA. Neuro follow-up. ? ?Update: CT angiogram head and neck does not appear to show a large vessel occlusion. 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  Plan as above. ? ?Our recommendations are outlined below. ? ?Recommendations: ? ?      Stroke/Telemetry Floor ?      Neuro Checks ?      Bedside Swallow Eval ?      DVT Prophylaxis ?      IV Fluids, Normal Saline ?      Head of Bed 30 Degrees ?      Euglycemia and Avoid Hyperthermia (PRN Acetaminophen) ?      Toxic/metabolic/infx workup per ED including UA, UDS ?      MRI brain without IV contrast ?      If no cerebral blood vessel imaging done in ED (such as CTA), recommend MRA head/neck without contrast, or carotid ultrasound if cannot obtain MRA ?      TSH, A1c, lipid profile ?      Transthoracic echocardiogram ?      Continuous telemetry ?      Physical, occupational, and speech therapies ?      q4h neuro checks/NIHSS ?      NPO until bedside swallow ?      Neurology follow-up ? ?[facility]  Care ? ?Sign Out: ?      Discussed with Emergency Department Provider ? ? ? ?------------------------------------------------------------------------------ ? ?Advanced Imaging: ?CTA Head and Neck Ordered: ? ?CTP Ordered: ? ? ?Metrics: ?Last Known Well: Unknown ?[facility] Notification Time: 03/10/2022 23:52:06 ?Arrival Time: 03/10/2022 22:30:00 ?Stamp Time: 03/10/2022 23:52:06 ?Initial Response Time: 03/11/2022 00:01:20 ?Symptoms: AMS. ?NIHSS Start Assessment Time: 03/11/2022 00:06:13 ?Patient is not a candidate for Thrombolytic. ?Thrombolytic Medical Decision: 03/11/2022 00:06:11 ?Patient was not deemed candidate for Thrombolytic because of following reasons: ?Other Diagnosis suspected. ?Care team unable to determine eligibility. ? ?CT head showed no acute hemorrhage or acute core infarct. ? ?ED Physician notified of diagnostic impression and management plan on 03/11/2022 00:13:32 ? ? ? ?------------------------------------------------------------------------------ ? ?History of Present Illness: ?Patient is a 76year old Female. ? ?Patient was brought by EMS for symptoms of AMS. ?76year old female who a stroke alert was called for altered mental status. Apparently EMS was called out for shortness of breath. She was brought into the ER. Reportedly per ED she was responding in the ambulance however by the time she came to the ER she was unresponsive and "staring straight ahead" per nursing staff. She went to CT. When she came back she was noticed to be blue and appeared  to be agonal breathing, therefore intubated for airway protection. No lateralizing symptoms or deficits. Reportedly she did have some purposeful movements of her bilateral upper extremities prior to intubation. Examination currently is nonfocal. No family at bedside, patient apparently lives alone. Last clear known normal unclear at this time. ? ? ?Past Medical History: ?Othere PMH:  Multiple myeloma, hypertension, hepatitis A, GERD,  hyperlipidemia ?unable to obtain due to:   Patient Is Obtunded/ Comatose ? ?Medications: ? ?No Anticoagulant use  ?No Antiplatelet use ?Reviewed EMR for current medications ? ?Allergies:  ? ?Allergies Unable To Obtain Due To: Patient Is Obtunded/ Comatose ? ?Social History: ?Unable To Obtain Due To Patient Status : Patient Is Obtunded/ Comatose ? ?Family History: ? ?Family History Cannot Be Obtained Because:Patient Is Obtunded/ Comatose ?There is no family history of premature cerebrovascular disease pertinent to this consultation ? ?ROS : ?14 Points Review of Systems was performed and was negative except mentioned in HPI. ?ROS Cannot Be Obtained Because:  Patient Is Obtunded/ Comatose ? ?Past Surgical History: ?Past Surgical History Cannot Be Obtained Because: Patient Is Obtunded/ Comatose ?There Is No Surgical History Contributory To Today?s Visit ? ?NIHSS may not be reliable due to: Patient was intubated and paralyzed ? ?Examination: ?BP(168/31), Pulse(80), ?1A: Level of Consciousness - Postures or Unresponsive + 3 ?1B: Ask Month and Age - Dysarthric/Intubated/ Trauma/Language Barrier + 1 ?1C: Blink Eyes & Squeeze Hands - Performs 0 Tasks + 2 ?2: Test Horizontal Extraocular Movements - Normal + 0 ?3: Test Visual Fields - No Visual Loss + 0 ?4: Test Facial Palsy (Use Grimace if Obtunded) - Normal symmetry + 0 ?5A: Test Left Arm Motor Drift - No Movement + 4 ?5B: Test Right Arm Motor Drift - No Movement + 4 ?6A: Test Left Leg Motor Drift - No Movement + 4 ?6B: Test Right Leg Motor Drift - No Movement + 4 ?7: Test Limb Ataxia (FNF/Heel-Shin) - Paralyzed + 0 ?8: Test Sensation - Coma/Unresponsive + 2 ?9: Test Language/Aphasia - Coma/Unresponsive + 3 ?10: Test Dysarthria - Intubated/Unable to Test + 0 ?11: Test Extinction/Inattention - No abnormality + 0 ? ?NIHSS Score: 27 ? ? ?Pre-Morbid Modified Rankin Scale: ?Unable to assess ? ? ?Patient/Family was informed the Neurology Consult would occur via TeleHealth consult  by way of interactive audio and video telecommunications and consented to receiving care in this manner. ? ? ?Patient is being evaluated for possible acute neurologic impairment and high probability of imminent or life-threatening deterioration. I spent total of 35 minutes providing care to this patient, including time for face to face visit via telemedicine, review of medical records, imaging studies and discussion of findings with providers, the patient and/or family. ? ? ?Dr Knox Royalty ? ? ?[facility] ?[phone] ? ? ?Case [id number] ? ?

## 2022-03-11 NOTE — ED Notes (Signed)
Patient provided with pain medication to assist with making patient more comfortable and assist with blood pressure. Patient continues to follow commands and "answer" questions appropriately. Family is at bedside and getting updates from ICU NP and ER provider. ?

## 2022-03-11 NOTE — ED Notes (Signed)
Mittens placed on patient due to her now pulling at her ETT and NG tube. ?

## 2022-03-11 NOTE — ED Notes (Addendum)
Attempted to call report to Leana Roe, RN in ICU. Nurse will call back ?

## 2022-03-11 NOTE — Progress Notes (Signed)
Patient taken to MRI soon after noontime with respiratory therapy, myself, and transport.  Patient remained in stable condition going to, during, and coming back from MRI.   ?

## 2022-03-11 NOTE — Progress Notes (Signed)
Neurology progress note ? ?S: Patient seen by teleneurology overnight for altered mental status and unresponsiveness. She was not given TNK 2/2 no lateralizing sx or deficits. She was intubated for poor respiratory effort and was hypoxic by ABG afterwards despite mechanical ventilation. MRI brain wo contrast showed multiple small bilateral acute ischemic infarcts (personal review). When sedation is paused patient is able to follow commands and is nonfocal. ? ?O: ? ?Vitals:  ? 03/11/22 1531 03/11/22 1545  ?BP:  (!) 147/83  ?Pulse:  70  ?Resp:  17  ?Temp:    ?SpO2: 100%   ? ?Sedation paused x30 min prior to exam ? ?Physical Exam ?Gen: awake but keeps eyes squeezed shut. Follows simple commands, axially and in all extremities ?Resp: ventilated ?CV: RRR, extremities appear well-perfused. ? ?Neuro: ?*MS: awake but keeps eyes squeezed shut. Follows simple commands, axially and in all extremities ?*Speech: intubated ?*CN: PERRL, (+) corneals/oculocephalics/cough/gag. Face symmetric at rest. Hearing intact to voice ?*Motor: Anti-gravity BUE, symmetric. Able to wiggle toes bilat on command. ?*Sensory: SILT ?*Coordination, gait: UTA ? ? ?NIHSS ?1a Level of Conscious.: 1 ?1b LOC Questions: 0 ?1c LOC Commands: 0 ?2 Best Gaze: 0 ?3 Visual: 0 ?4 Facial Palsy: 0 ?5a Motor Arm - left: 1 ?5b Motor Arm - Right: 1 ?6a Motor Leg - Left: 3 ?6b Motor Leg - Right: 3 ?7 Limb Ataxia: 0 ?8 Sensory: 0 ?9 Best Language: 0 ?10 Dysarthria: 0 ?11 Extinct. and Inatten.: 0 ? ?TOTAL: 9 ? ?Premorbid mRS = 2 ? ?A/P: 76 yo patient with hx multiple myeloma, HTN, HL admitted with respiratory distress and intubated for hypoxic respiratory failure in ED. I suspect her staring spell yesterday was 2/2 encephalopathy in the setting of acute respiratory failure, but MRI brain was notable for multiple acute infarcts bilateral cerebral hemispheres. Ddx includes seizure in the setting of acute infarcts. Bilateral infarcts c/w central embolic source.  ? ?- rEEG  Mon ?- Permissive HTN x48 hrs from sx onset goal BP <220/110. PRN labetalol or hydralazine if BP above these parameters. Avoid oral antihypertensives. ?- CTA obtained in ED - no LVO or hemodynamically significant stenosis. Incidental 34m R MCA bifurcation aneurysm may be f/b outpatient neurosurgery or neuro-IR ?- TTE pending. If negative for intracardiac clot, consider TEE if patient is stable to undergo. Request cardiology input on this given age of 89and comorbidities. Would not anticoagulate empirically in absence of a source. ?- Check A1c and LDL + add statin per guidelines ?- Start plavix 756mdaily. Ideally patient would be on DAPT plavix 7514maily + aspirin 65m47mily x21 days f/b ASA 65mg64mly after that. I did not start aspirin 2/2 listed allergy to ibuprofen with unclear reaction in chart. If allergy can be clarified with family and she is able to tolerate aspirin, please also start aspirin 65mg 37my. ?- q4 hr neuro checks ?- STAT head CT for any change in neuro exam ?- Tele ?- PT/OT/SLP when able to participate ?- Stroke education prior to discharge ?- Amb referral to neurology upon discharge ?- Ambulatory cardiac monitor and f/u with cardiology after discharge if no intracardiac clot identified on echo ? ?Will continue to follow ? ?[facility] ?[phone] 7pm- 7am, please page neurology on call as listed in AMION.[place]?

## 2022-03-11 NOTE — Progress Notes (Signed)
Dr. Maryan Rued made aware of CTA head/neck results ?

## 2022-03-11 NOTE — ED Notes (Signed)
Teleneurology notified that there has been a change in the patient's condition. She is responding to her daughter's yes or no questions appropriately. She can follow some commands appropriately.  ?

## 2022-03-11 NOTE — Progress Notes (Signed)
Troponin 110 informed MD ?

## 2022-03-11 NOTE — Plan of Care (Signed)
Continuing with plan of care. 

## 2022-03-11 NOTE — Plan of Care (Signed)
Discussed with patient plan of care for the shift and pain management with some teach back observed.  Discussed with daughter admission questions and updated status patient with teach back displayed. ? ?Problem: Education: ?Goal: Knowledge of disease or condition will improve ?03/11/2022 0524 by Jannette Fogo, RN ?Outcome: Progressing ? ?

## 2022-03-11 NOTE — H&P (Signed)
? ?NAME:  Wendy [name], MRN:  [id number], DOB:  1946/03/26, LOS: 0 ?ADMISSION DATE:  03/10/2022, CONSULTATION DATE:  03/11/22 ?REFERRING MD:  Dr. Creig Hines, CHIEF COMPLAINT:  Shortness of breath & CODE stroke ? ?History of Present Illness:  ?76 yo F presenting to [facility] ED from home via EMS on 03/10/22 with complaints of respiratory distress. Per ED documentation, EMS found the patient dyspneic and hypoxic with SpO2 88% on RA, she was A&O x 4. She received duo-neb, solu medrol, Zofran & magnesium. 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 She reports since her mother switched to immunotherapy she had been doing better, no recent complaints of any kind. Of note, patient was on Eliquis for un-provoked bilateral DVT's until 02/22/22 when this medication was stopped. ? ?ED course: ?Upon arrival to ED, as EMS was wheeling her into triage she appeared to have an abrupt change in mental status: staring off into space, nonverbal/unable to follow commands/unresponsive except for eyes being open and blinking to threat. CODE STROKE was initiated. When the patient returned from the [facility] wo contrast her respiratory status declined and her mentation had not improved, EDP made the decision to emergently intubate the patient placing her on mechanical ventilatory support. Tele-neurology assessed the patient and imaging deeming the patient NOT a candidate for TNKase and there was no LVO. ?Medications given: Fentanyl, Lasix, IV contrast ?Initial Vitals: afebrile- 97.7, RR 20, tachycardic- 111, hypertensive: 214/111 and SpO2 98% on RA ?Significant labs: (Labs/ Imaging personally reviewed) ?I, Domingo Pulse Rust-Chester, AGACNP-BC, personally viewed and interpreted this ECG. ?EKG Interpretation: Date: 03/10/22, EKG Time: 22:36, Rate: 100, Rhythm: ST, QRS Axis:  LAD, Intervals: normal, ST/T Wave abnormalities: mild STE in avR only, lateral T wave  inversions, Narrative Interpretation: ST with probable LVH ?Chemistry: Na+: 133, K+: 4, BUN/Cr.: 20/0.85, Serum CO2/ AG: 26/9 ?TSH: 21.68, T3/T4: pending ?Hematology: WBC: 12.8, Hgb: 12.3,  ?Troponin: 46, BNP: 1562, PCT: pending, ?COVID-19 & Influenza A/B: negative ?ABG: 7.37/ 41/ 65/ 23.7 ?CXR 03/10/22: Progressive peribronchial thickening suggestive of pulmonary edema. Bilateral pleural effusions ?CT head wo contrast 03/10/22: no acute intracranial abnormality. ASPECTS 10. ?CTa head/neck 03/11/22: No emergent large vessel occlusion or hemodynamically significant stenosis of the intracranial or cervical arteries. 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 Medium-sized pleural effusions with innumerable bilateral pulmonary nodules, too many to count.  ?CT angio chest 03/11/22: negative for PE. Enlarging bilateral pleural effusions, interval development of diffuse ground-glass pulmonary infiltrate most suggestive of alveolar pulmonary edema & development of diffuse body wall and retroperitoneal edema in keeping with anasarca. Moderate multi-vessel coronary artery calcification. Mild cardiomegaly with left ventricular hypertrophy. Grossly stable diffuse subcentimeter pulmonary nodularity. Interval development of a subacute manubrial fracture. Osteopenia. ? ?PCCM consulted for admission due to acute respiratory failure in the setting of suspected acute ischemic stroke with emergent intubation requiring mechanical ventilatory support. In discussions bedside with the patient's daughter and EDP, the patient's DNR status was confirmed. Of note, the patient has become responsive, able to follow commands and nod somewhat appropriately. Amy, the patient's daughter would like to leave her mother on mechanical ventilatory support overnight. If she is unable to wean off mechanical ventilation in the morning, she would like to wait until a follow up MRI can be performed before making the decision to extubate. ? 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  Osteoporosis ?Insomnia ?HFpEF - echo 04/2021: LVEF 60-65%, G2DD, PASP 42.9, LA mildly dilated, mild MR ?Bilateral DVT ?Former Smoker ?CAD ?HLD ? ?Significant Hospital Events: ?Including procedures, antibiotic start and stop dates in addition to other pertinent events   ?03/11/22: Admit to ICU with suspected TIA/ischemic CVA and acute hypoxic respiratory failure in the setting of pulmonary edema on mechanical ventilatory support ? ?Interim History / Subjective:  ?On initial assessment patient unresponsive, RASS: -5, eyes open with blinking to threat. ?Subsequent assessment patient responsive to voice with a RASS: -1, nodding appropriately to questions, able to wiggle toes with a 4/5 plantar flexion, gravity eliminated in BLE, 4/5 push and pull in BUE and bilateral thumbs up to command.  ? ?Objective   ?Blood pressure (!) 171/89, pulse 80, temperature 97.7 ?F (36.5 ?C), temperature source Axillary, resp. rate (!) 22, height '5\' 2"'  (1.575 m), weight 50 kg, SpO2 100 %. ?   ?Vent Mode: AC ?FiO2 (%):  [60 %] 60 % ?Set Rate:  [20 bmp] 20 bmp ?Vt Set:  [420 mL] 420 mL ?PEEP:  [5 cmH20] 5 cmH20  ?No intake or output data in the 24 hours ending 03/11/22 0142 ?Filed Weights  ? 03/10/22 2239  ?Weight: 50 kg  ? ? ?Examination: ?General: Adult female, critically ill, lying in bed intubated & sedated requiring mechanical ventilation NAD ?HEENT: MM pink/moist, anicteric, atraumatic, neck supple ?Neuro: RASS: -1, able to follow simple commands, PERRL +3, MAE ?CV: s1s2 RRR, NSR on monitor, no r/m/g ?Pulm: Regular, non labored on PRVC 40%/ PEEP 5, breath sounds rhonchi-BUL & diminished-BLL ?GI: soft, rounded, non tender, bs x 4 ?GU: foley in place with clear yellow urine ?Skin: abrasion on lower lip ?Extremities: warm/dry, pulses + 2 R/P, no edema noted ? ?Resolved Hospital Problem list   ? ? ?Assessment & Plan:   ?Suspected TIA ?Recent Mechanical Falls - PTA ?PMHx: Bilateral DVT's, HTN, HLD ?Not a candidate for TNKase ?CTH: negative, ASPECTS 10, CTangio head/neck/perfusion: negative for LVO, incidental aneurysm. No focal deficits initially RASS: -5/ now recovered to a RASS: -1. ?- Neuro checks Q 2 x 24 hours ?- NIHSS Q shift ?- SLP, PT/OT consult once extubated ?- Neurology consulted, appreciate input ?- consider f/u MRI/MRA   ?- Echocardiogram ordered ?- f/u Lipid panel & Hgb A1c, Euglycemia > SSI moderate ordered, target range 140-180 ?- restart outpatient Crestor ?-Lovenox for VTE prophylaxis ?- falls precaution ? ?Acute Hypoxic Respiratory Failure secondary to pleural effusions in the setting of acute encephalopathy and suspected TIA ?- Ventilator settings: PRVC  8 mL/kg, 40% FiO2, 5 PEEP, continue ventilator support & lung protective strategies ?- Wean PEEP & FiO2 as tolerated, maintain SpO2 > 90% ?- Head of bed elevated 30 degrees, VAP protocol in place ?- Plateau pressures less than 30 cm H20  ?- Intermittent chest x-ray & ABG PRN ?- Daily WUA with SBT as tolerated  ?- Ensure adequate pulmonary hygiene  ?- trend PCT, initiate antibiotics if needed ?- Bronchodilators PRN ?- PAD protocol in place: continue Fentanyl drip & Propofol drip ? ?Acute on Chronic HFpEF exacerbation ?Elevated Troponin secondary to N-STEMI vs demand ischemia ?PMHx: HFpEF, CAD, HLD, HTN ?Pt received 80 mg of lasix in ED ?- Trend troponin > consider starting heparin drip per pharmacy consult if trending up ?- Echocardiogram ordered ?- continuous cardiac monitoring ?- Strict I/O's: alert provider if UOP < 0.5 mL/kg/hr ?- Daily BMP, replace electrolytes PRN ?- Daily weights to assess volume status ?- Diurese with IV lasix as hemodynamics  and renal function allow ?- Supplemental oxygen as needed, maintain SpO2 > 90% ? ?Elevated TSH ?No PMHx of thyroid dysfunction ?- f/u free T4/T3 ? ?Mild Leukocytosis ?Multiple Myeloma, AL without current  exacerbation ?Recent History of Multiple UTI's, completed Cipro tx ?Receiving immunotherapy, Daratumumab infusions, outpatient monthly. PCT <0.10, UA negative for acute infection ?- monitor renal function ?- daily CBC, tr

## 2022-03-11 NOTE — ED Notes (Signed)
RT at bedside.

## 2022-03-11 NOTE — Progress Notes (Signed)
Pharmacy Electrolyte Monitoring Consult: ? ?Pharmacy consulted to assist in monitoring and replacing electrolytes in this 76 y.o. female admitted on 03/10/2022 with Code Stroke ? ? ?Labs: ? ?Sodium (mmol/L)  ?Date Value  ?03/11/2022 132 (L)  ?11/03/2020 123 (L)  ? ?Potassium (mmol/L)  ?Date Value  ?03/11/2022 3.4 (L)  ? ?Magnesium (mg/dL)  ?Date Value  ?03/11/2022 2.4  ? ?Phosphorus (mg/dL)  ?Date Value  ?03/11/2022 2.8  ? ?Calcium (mg/dL)  ?Date Value  ?03/11/2022 9.1  ? ?Albumin (g/dL)  ?Date Value  ?03/10/2022 3.3 (L)  ? ? ?Assessment/Plan: ?-K 3.4   NP ordered KCL 40 meq per tube x 1 dose ?Patient received lasix '80mg'$  IV at 0117 this am ?F/u electrolytes w/ am labs ? ? ?Wendy [name] A ?03/11/2022 ?8:47 AM ?                     ? ? ? ? ?  ?

## 2022-03-11 NOTE — ED Provider Notes (Signed)
I assumed care of this patient approximately 2300.  Please see outgoing providers note for full details regarding patient's initial evaluation and assessment.  In brief patient presented to the emergency room via EMS with concerns for arthritis breath.  She was treated for possible obstructive airway disease exacerbation with DuoNebs and Solu-Medrol prior to arrival with EMS.  She was hypoxic at 88% reportedly on room air with EMS.  Shortly after arriving the emergency room she became unresponsive.  She was made a code stroke and intubated. ? ?Noncon head CT unremarkable.  Chest x-ray unremarkable aside from some possible mild early pulmonary edema.  ECG is remarkable sinus tachycardia with a ventricular rate of 100 with some nonspecific ST changes in aVL and V6 and lead I without other clear evidence of acute ischemia or significant arrhythmia. ? ?CMP shows a glucose of 220 without other significant electrolyte or metabolic derangements.  Ethanol is undetectable.  CBG is within normal limits.  CBC shows WBC count of 12.8 without evidence of acute anemia.  Initial troponin slightly elevated at 46 compared to 4810 months ago.  Will defer anticoagulation pending repeat troponin as I have a low suspicion for  an occlusion MI at this time.  BNP is quite elevated at 1562 and given findings of edema on chest x-ray and some edema on my bedside exam as well as JVD concern for possible CHF.  We will order 80 mg of Lasix. ? ?CTA head and neck, interpretation without evidence of large vessel occlusion or stenosis.  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 ? ?CTA chest on my interpretation without evidence of a PE but does show evidence of edema and bilateral pleural effusions concerning for volume overload.  Also reviewed  radiology interpretation and agree with the findings of same in addition to diffuse groundglass pulmonary infiltrates concerning for alveolar edema as well as anasarca and moderate multivessel CAD and development of subacute manubrial fracture as well as osteopenia and aortic atherosclerosis ? ?Discussed patient presentation and work-up and current clinical status with family at bedside who confirmed patient is DNR.  They wish to maintain ventilatory support at this time and I have discussion with ICU provider will admit to medical ICU service for further evaluation and management.  Of note on several reassessments patient is improving and able to follow commands and give this examiner thumbs up with both hands and wiggle her toes on command.  She is denying any pain. ? ?.Critical Care ?Performed by: Lucrezia Starch, MD ?Authorized by: Lucrezia Starch, MD  ? ?Critical care provider statement:  ?  Critical care time (minutes):  30 ?  Critical care was necessary to treat or prevent imminent or life-threatening deterioration of the following conditions:  Respiratory failure and CNS failure or compromise ?  Critical care was time spent personally by me on the following activities:  Development of treatment plan with patient or surrogate, discussions with consultants, evaluation of patient's response to treatment, examination of patient, ordering and review of laboratory studies, ordering and review of radiographic studies, ordering and performing treatments and interventions, pulse oximetry, re-evaluation of patient's condition and review of old charts ?  I assumed direction of critical care for this patient from another provider in my specialty: yes   ?  Care discussed with: admitting provider   ? ?  ?  Lucrezia Starch, MD ?03/11/22 0153 ? ?

## 2022-03-11 NOTE — Progress Notes (Signed)
Updated patient's daughter Darnell Level ([place]) via phone conversation.  Price her of the timings on MRI of multiple small acute infarcts in the high frontal and parietal lobes bilaterally.  These are cysts highly suspect for embolic source.  2D echo has been performed and pending.  She may need TEE. ? ?Have ordered EEG for the morning after discussion with neurology.  Neurology is to see patient. ? ?I advised Ms. Vines that it would be best to keep patient intubated particularly if TEE is necessary.  After that we can assess for extubation.  Patient does respond appropriately on wake up assessment but appears to be slightly weaker on the right. ? ?C. Derrill Kay, MD ?Advanced Bronchoscopy ?[facility] ? ? ? ?*This note was dictated using voice recognition software/Dragon.  Despite best efforts to proofread, errors can occur which can change the meaning. Any transcriptional errors that result from this process are unintentional and may not be fully corrected at the time of dictation. ?

## 2022-03-11 NOTE — Progress Notes (Signed)
Patient had 3 small gold hoop earings on that were removed before taking the patient to MRI.  Earings placed in a bag with patient label and security contacted to have them locked in the safe.   ?

## 2022-03-12 DIAGNOSIS — J9601 Acute respiratory failure with hypoxia: Secondary | ICD-10-CM | POA: Diagnosis not present

## 2022-03-12 DIAGNOSIS — I509 Heart failure, unspecified: Secondary | ICD-10-CM

## 2022-03-12 DIAGNOSIS — R4182 Altered mental status, unspecified: Secondary | ICD-10-CM | POA: Diagnosis not present

## 2022-03-12 DIAGNOSIS — I639 Cerebral infarction, unspecified: Secondary | ICD-10-CM | POA: Diagnosis not present

## 2022-03-12 DIAGNOSIS — E43 Unspecified severe protein-calorie malnutrition: Secondary | ICD-10-CM | POA: Diagnosis present

## 2022-03-12 LAB — GLUCOSE, CAPILLARY
Glucose-Capillary: 106 mg/dL — ABNORMAL HIGH (ref 70–99)
Glucose-Capillary: 113 mg/dL — ABNORMAL HIGH (ref 70–99)
Glucose-Capillary: 117 mg/dL — ABNORMAL HIGH (ref 70–99)
Glucose-Capillary: 69 mg/dL — ABNORMAL LOW (ref 70–99)
Glucose-Capillary: 74 mg/dL (ref 70–99)
Glucose-Capillary: 79 mg/dL (ref 70–99)
Glucose-Capillary: 83 mg/dL (ref 70–99)
Glucose-Capillary: 93 mg/dL (ref 70–99)
Glucose-Capillary: 95 mg/dL (ref 70–99)

## 2022-03-12 LAB — COMPREHENSIVE METABOLIC PANEL
ALT: 10 U/L (ref 0–44)
AST: 15 U/L (ref 15–41)
Albumin: 2.8 g/dL — ABNORMAL LOW (ref 3.5–5.0)
Alkaline Phosphatase: 117 U/L (ref 38–126)
Anion gap: 8 (ref 5–15)
BUN: 29 mg/dL — ABNORMAL HIGH (ref 8–23)
CO2: 25 mmol/L (ref 22–32)
Calcium: 8 mg/dL — ABNORMAL LOW (ref 8.9–10.3)
Chloride: 98 mmol/L (ref 98–111)
Creatinine, Ser: 1.32 mg/dL — ABNORMAL HIGH (ref 0.44–1.00)
GFR, Estimated: 42 mL/min — ABNORMAL LOW (ref >60)
Glucose, Bld: 93 mg/dL (ref 70–99)
Potassium: 5.3 mmol/L — ABNORMAL HIGH (ref 3.5–5.1)
Sodium: 131 mmol/L — ABNORMAL LOW (ref 135–145)
Total Bilirubin: 0.6 mg/dL (ref 0.3–1.2)
Total Protein: 5.2 g/dL — ABNORMAL LOW (ref 6.5–8.1)

## 2022-03-12 LAB — RESP PANEL BY RT-PCR (FLU A&B, COVID) ARPGX2
Influenza A by PCR: NEGATIVE
Influenza B by PCR: NEGATIVE
SARS Coronavirus 2 by RT PCR: NEGATIVE

## 2022-03-12 LAB — CBC WITH DIFFERENTIAL/PLATELET
Abs Immature Granulocytes: 0.07 10*3/uL (ref 0.00–0.07)
Basophils Absolute: 0 10*3/uL (ref 0.0–0.1)
Basophils Relative: 0 %
Eosinophils Absolute: 0 10*3/uL (ref 0.0–0.5)
Eosinophils Relative: 0 %
HCT: 30.6 % — ABNORMAL LOW (ref 36.0–46.0)
Hemoglobin: 10.8 g/dL — ABNORMAL LOW (ref 12.0–15.0)
Immature Granulocytes: 1 %
Lymphocytes Relative: 8 %
Lymphs Abs: 0.9 10*3/uL (ref 0.7–4.0)
MCH: 30.8 pg (ref 26.0–34.0)
MCHC: 35.3 g/dL (ref 30.0–36.0)
MCV: 87.2 fL (ref 80.0–100.0)
Monocytes Absolute: 1 10*3/uL (ref 0.1–1.0)
Monocytes Relative: 8 %
Neutro Abs: 9.4 10*3/uL — ABNORMAL HIGH (ref 1.7–7.7)
Neutrophils Relative %: 83 %
Platelets: 353 10*3/uL (ref 150–400)
RBC: 3.51 MIL/uL — ABNORMAL LOW (ref 3.87–5.11)
RDW: 14.1 % (ref 11.5–15.5)
WBC: 11.5 10*3/uL — ABNORMAL HIGH (ref 4.0–10.5)
nRBC: 0 % (ref 0.0–0.2)

## 2022-03-12 LAB — PROCALCITONIN: Procalcitonin: 3.71 ng/mL

## 2022-03-12 LAB — MAGNESIUM: Magnesium: 2.1 mg/dL (ref 1.7–2.4)

## 2022-03-12 LAB — PHOSPHORUS: Phosphorus: 3.2 mg/dL (ref 2.5–4.6)

## 2022-03-12 LAB — BRAIN NATRIURETIC PEPTIDE: B Natriuretic Peptide: 721.7 pg/mL — ABNORMAL HIGH (ref 0.0–100.0)

## 2022-03-12 MED ORDER — PROSOURCE TF PO LIQD
45.0000 mL | Freq: Two times a day (BID) | ORAL | Status: DC
  Administered 2022-03-12 – 2022-03-14 (×5): 45 mL
  Filled 2022-03-12: qty 45

## 2022-03-12 MED ORDER — DEXTROSE 50 % IV SOLN
25.0000 mL | Freq: Once | INTRAVENOUS | Status: AC
  Administered 2022-03-12: 25 mL via INTRAVENOUS
  Filled 2022-03-12: qty 50

## 2022-03-12 MED ORDER — ADULT MULTIVITAMIN LIQUID CH
15.0000 mL | Freq: Every day | ORAL | Status: DC
  Administered 2022-03-13 – 2022-03-14 (×2): 15 mL
  Filled 2022-03-12 (×3): qty 15

## 2022-03-12 MED ORDER — DEXTROSE 50 % IV SOLN
12.5000 g | INTRAVENOUS | Status: AC
  Administered 2022-03-12: 12.5 g via INTRAVENOUS
  Filled 2022-03-12: qty 50

## 2022-03-12 MED ORDER — SODIUM ZIRCONIUM CYCLOSILICATE 5 G PO PACK
10.0000 g | PACK | Freq: Every day | ORAL | Status: DC
  Administered 2022-03-12: 10 g
  Filled 2022-03-12: qty 2

## 2022-03-12 MED ORDER — VITAL 1.5 CAL PO LIQD
1000.0000 mL | ORAL | Status: DC
Start: 2022-03-12 — End: 2022-03-15
  Administered 2022-03-12 – 2022-03-14 (×3): 1000 mL

## 2022-03-12 MED ORDER — MIDODRINE HCL 5 MG PO TABS
5.0000 mg | ORAL_TABLET | Freq: Three times a day (TID) | ORAL | Status: DC
  Administered 2022-03-12 – 2022-03-15 (×9): 5 mg
  Filled 2022-03-12 (×9): qty 1

## 2022-03-12 MED ORDER — ENOXAPARIN SODIUM 30 MG/0.3ML IJ SOSY
30.0000 mg | PREFILLED_SYRINGE | INTRAMUSCULAR | Status: DC
  Administered 2022-03-12 – 2022-03-14 (×3): 30 mg via SUBCUTANEOUS
  Filled 2022-03-12 (×3): qty 0.3

## 2022-03-12 MED ORDER — FREE WATER
30.0000 mL | Status: DC
Start: 2022-03-12 — End: 2022-03-15
  Administered 2022-03-12 – 2022-03-15 (×15): 30 mL

## 2022-03-12 MED ORDER — LEVETIRACETAM IN NACL 1000 MG/100ML IV SOLN
1000.0000 mg | INTRAVENOUS | Status: AC
  Administered 2022-03-12: 1000 mg via INTRAVENOUS
  Filled 2022-03-12: qty 100

## 2022-03-12 NOTE — Plan of Care (Signed)
? ?  Interdisciplinary Goals of Care Family Meeting ? ? ?Date carried out: 03/12/2022 ? ?Location of the meeting: Phone conference ? ?Member's involved: Physician and Family Member or next of kin ? ?Durable Power of Tour manager: Daughter Wendy [name]   ? ?Discussion: We discussed goals of care for Wendy [name] .   ?Daughter Wendy [name] [phone] ? ?Code status: Full DNR ? ?Disposition: Continue current acute care ? ? ?GOALS OF CARE DISCUSSION ? ?The Clinical status was relayed to family in detail- ? ?Updated and notified of patients medical condition- ?Patient remains unresponsive and will not open eyes to command.   ?Patient is having a weak cough and struggling to remove secretions.   ?Patient with increased WOB and using accessory muscles to breathe ?Explained to family course of therapy and the modalities  ? ? ?Patient with Progressive multiorgan failure with a very high probablity of a very minimal chance of meaningful recovery despite all aggressive and optimal medical therapy.  ? ?Family understands the situation. ?Brain damage from acute strokes, severe resp failure ?EEG pending ? ?Family are satisfied with Plan of action and management. All questions answered ? ?Additional CC time 35 mins ? ? ?Corrin Parker, M.D.  ?[facility]  ?Medical Director [place] ?Medical Director [facility]  ? ? ?

## 2022-03-12 NOTE — Progress Notes (Signed)
No seizure on spot EEG but it was done while on propofol. ?Will reduce sedation tomorrow and attempt EEG off of propofol to look for more info ?Called daughter and discussed plan - which includes possible TEE. Will recommend consulting cardiology for TEE while intubated. ? ?Plan relayed to Dr. Mortimer Fries ? ?-- ?Amie Portland, MD ?Neurologist ?[facility] ?Pager: [phone] ? ?

## 2022-03-12 NOTE — Progress Notes (Signed)
PHARMACIST - PHYSICIAN COMMUNICATION ? ?CONCERNING:  Enoxaparin (Lovenox) for DVT Prophylaxis  ? ? ?RECOMMENDATION: ?Patient was prescribed enoxaparin '40mg'$  q24 hours for VTE prophylaxis.  ? ?Filed Weights  ? 03/10/22 2239 03/11/22 0428 03/12/22 0400  ?Weight: 50 kg (110 lb 3.7 oz) 48.2 kg (106 lb 4.2 oz) 47 kg (103 lb 9.9 oz)  ? ? ?Body mass index is 18.95 kg/m?. ? ?Estimated Creatinine Clearance: 26.5 mL/min (A) (by C-G formula based on SCr of 1.32 mg/dL (H)). ? ?Patient is candidate for enoxaparin '30mg'$  every 24 hours based on CrCl <4m/min or Weight <45kg ? ?DESCRIPTION: ?[facility] ? ?Patient is now receiving enoxaparin 30 mg every 24 hours  ? ? ?ABenita Gutter?03/12/2022 ?7:41 AM  ?

## 2022-03-12 NOTE — Progress Notes (Addendum)
Neurology Progress Note ? ? ?S:// ?Seen and exmined  in the ICU ?Remains intubated ?On fentanyl drip ?Not on propofol at the time of this exam ? ? ?O:// ?Current vital signs: ?BP (!) 143/64 (BP Location: Left Arm)   Pulse 72   Temp 98.2 ?F (36.8 ?C) (Rectal)   Resp 20   Ht _0  (1.575 m)   Wt 47 kg   SpO2 100%   BMI 18.95 kg/m?  ?Vital signs in last 24 hours: ?Temp:  [96.4 ?F (35.8 ?C)-99.8 ?F (37.7 ?C)] 98.2 ?F (36.8 ?C) (03/20 0900) ?Pulse Rate:  [43-81] 72 (03/20 0900) ?Resp:  [13-24] 20 (03/20 0900) ?BP: (79-155)/(49-107) 143/64 (03/20 0900) ?SpO2:  [99 %-100 %] 100 % (03/20 0900) ?FiO2 (%):  [30 %] 30 % (03/20 0900) ?Weight:  [47 kg] 47 kg (03/20 0400) ?General: Cachectic looking woman, intubated, sedated with fentanyl ?HEENT: Normocephalic atraumatic ?CVs: Regular rate rhythm ?Respiratory: Vented ?Abdomen nondistended nontender ?Neurological exam ?Cachectic looking woman who is intubated and sedated with fentanyl ?Does not open eyes to voice but has spontaneous eye opening and is blinking but does not blink to threat ?Pupils are equal round reactive to light, gaze is somewhat upwardly deviated bilaterally. ?Does not follow any commands ?Spontaneous movement of the upper extremities left greater than right. ?To noxious stimulation, attempts localization of the left upper extremity but not with the right ?No withdrawal or response to noxious stimulation in the lower extremities ? ?Medications ? ?Current Facility-Administered Medications:  ?  0.9 %  sodium chloride infusion, 250 mL, Intravenous, Continuous, Teressa Lower, NP ?  acetaminophen (TYLENOL) tablet 650 mg, 650 mg, Per Tube, Q6H PRN, Rust-Chester, Toribio Harbour L, NP ?  albuterol (PROVENTIL) (2.5 MG/3ML) 0.083% nebulizer solution 2.5 mg, 2.5 mg, Nebulization, Q4H PRN, Rust-Chester, Britton L, NP ?  chlorhexidine gluconate (MEDLINE KIT) (PERIDEX) 0.12 % solution 15 mL, 15 mL, Mouth Rinse, BID, Tyler Pita, MD, 15 mL at 03/12/22 0851 ?   Chlorhexidine Gluconate Cloth 2 % PADS 6 each, 6 each, Topical, Daily, Tyler Pita, MD, 6 each at 03/12/22 [phone] ?  clopidogrel (PLAVIX) tablet 75 mg, 75 mg, Per Tube, Daily, Derek Jack, MD, 75 mg at 03/11/22 1716 ?  docusate (COLACE) 50 MG/5ML liquid 100 mg, 100 mg, Per Tube, BID, Rust-Chester, Britton L, NP, 100 mg at 03/11/22 2126 ?  enoxaparin (LOVENOX) injection 30 mg, 30 mg, Subcutaneous, Q24H, [name], RPH ?  fentaNYL (SUBLIMAZE) bolus via infusion 25-100 mcg, 25-100 mcg, Intravenous, Q15 min PRN, Rust-Chester, Britton L, NP, 50 mcg at 03/11/22 1613 ?  fentaNYL 2581mg in NS 255m(1086mml) infusion-PREMIX, 25-200 mcg/hr, Intravenous, Continuous, Rust-Chester, Britton L, NP, Last Rate: 15 mL/hr at 03/12/22 0928, 150 mcg/hr at 03/12/22 0928 ?  insulin aspart (novoLOG) injection 0-15 Units, 0-15 Units, Subcutaneous, Q4H, SmiLucrezia StarchD, 2 Units at 03/11/22 0125 ?  MEDLINE mouth rinse, 15 mL, Mouth Rinse, 10 times per day, GonTyler PitaD, 15 mL at 03/12/22 [phone] midazolam (VERSED) injection 1 mg, 1 mg, Intravenous, Q15 min PRN, Rust-Chester, BriToribio Harbour NP ?  midazolam (VERSED) injection 1 mg, 1 mg, Intravenous, Q2H PRN, Rust-Chester, Britton L, NP ?  midodrine (PROAMATINE) tablet 5 mg, 5 mg, Per Tube, TID WC, Kasa, Kurian, MD ?  norepinephrine (LEVOPHED) 4mg16m 250mL77m016 mg/mL) premix infusion, 2-10 mcg/min, Intravenous, Titrated, NelsoTeressa Lower Last Rate: 18.75 mL/hr at 03/12/22 0928, 5 mcg/min at 03/12/22 0928 ?  pantoprazole sodium (PROTONIX) 40  mg/20 mL oral suspension 40 mg, 40 mg, Per Tube, Daily, Rust-Chester, Britton L, NP, 40 mg at 03/11/22 0908 ?  polyethylene glycol (MIRALAX / GLYCOLAX) packet 17 g, 17 g, Per Tube, Daily, Rust-Chester, Britton L, NP ?  propofol (DIPRIVAN) 1000 MG/100ML infusion, 5-50 mcg/kg/min, Intravenous, Titrated, Rust-Chester, Britton L, NP, Last Rate: 10 mL/hr at 03/12/22 0928, 33.3333 mcg/kg/min at 03/12/22 0928 ?  rosuvastatin  (CRESTOR) tablet 10 mg, 10 mg, Per Tube, Daily, Rust-Chester, Britton L, NP, 10 mg at 03/11/22 0908 ?  sodium zirconium cyclosilicate (LOKELMA) packet 10 g, 10 g, Per Tube, Daily, Rust-Chester, Huel Cote, NP ?Labs ?CBC ?   ?Component Value Date/Time  ? WBC 11.5 (H) 03/12/2022 0533  ? RBC 3.51 (L) 03/12/2022 0533  ? HGB 10.8 (L) 03/12/2022 0533  ? HGB 11.0 (L) 02/22/2022 0853  ? HCT 30.6 (L) 03/12/2022 0533  ? PLT 353 03/12/2022 0533  ? PLT 445 (H) 02/22/2022 0853  ? MCV 87.2 03/12/2022 0533  ? MCH 30.8 03/12/2022 0533  ? MCHC 35.3 03/12/2022 0533  ? RDW 14.1 03/12/2022 0533  ? LYMPHSABS 0.9 03/12/2022 0533  ? MONOABS 1.0 03/12/2022 0533  ? EOSABS 0.0 03/12/2022 0533  ? BASOSABS 0.0 03/12/2022 0533  ? ? ?CMP  ?   ?Component Value Date/Time  ? NA 131 (L) 03/12/2022 0533  ? NA 123 (L) 11/03/2020 1103  ? K 5.3 (H) 03/12/2022 0533  ? CL 98 03/12/2022 0533  ? CO2 25 03/12/2022 0533  ? GLUCOSE 93 03/12/2022 0533  ? BUN 29 (H) 03/12/2022 0533  ? BUN 10 11/03/2020 1103  ? CREATININE 1.32 (H) 03/12/2022 0533  ? CREATININE 0.85 02/22/2022 0853  ? CALCIUM 8.0 (L) 03/12/2022 0533  ? PROT 5.2 (L) 03/12/2022 0533  ? ALBUMIN 2.8 (L) 03/12/2022 0533  ? AST 15 03/12/2022 0533  ? AST 13 (L) 02/22/2022 0853  ? ALT 10 03/12/2022 0533  ? ALT 7 02/22/2022 0853  ? ALKPHOS 117 03/12/2022 0533  ? BILITOT 0.6 03/12/2022 0533  ? BILITOT 0.4 02/22/2022 0853  ? GFRNONAA 42 (L) 03/12/2022 0533  ? GFRNONAA >60 02/22/2022 0853  ? GFRAA 93 11/03/2020 1103  ? ? ?glycosylated hemoglobin-6.0 ? ?Lipid Panel  ?   ?Component Value Date/Time  ? CHOL 182 03/11/2022 0333  ? TRIG 136 03/11/2022 0333  ? TRIG 134 03/11/2022 0333  ? HDL 57 03/11/2022 0333  ? CHOLHDL 3.2 03/11/2022 0333  ? VLDL 27 03/11/2022 0333  ? [place] 98 03/11/2022 0333  ? LDLDIRECT 86.1 03/06/2012 0841  ?LDL 98 ? ?2D echo ?LVEF 60 to 65%, no regional wall motion abnormalities of the left ventricle.  There is mild left ventricular hypertrophy.  Right ventricle systolic function moderately  reduced.  Pericardial effusion in anterior to the right ventricle and localized near the right atrium.  No evidence of cardiac tamponade.  Large pleural effusion in the left lateral region.  Mitral valve normal.  Aortic valve tricuspid with no evidence of regurgitation visualized.  Left atrial size is normal.  No atrial level shunt detected by color-flow Doppler. ? ? 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 ?CT angiography of the head and neck with no emergent large vessel occlusion or hemodynamically significant stenosis of the intracranial or cervical vasculature.  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  Medium size pleural effusions with innumerable bilateral pulmonary  nodules-too many to count-most consistent with metastatic disease. ? ?CT angio chest: report reviewed. ?IMPRESSION: ?-No pulmonary embolism. ?-Enlarging bilateral pleural effusions, interval development of ?diffuse ground-glass pulmonary infiltrate most suggestive of ?alveolar pulmonary edema, and development of diffuse body wall and ?retroperitoneal edema in keeping with anasarca. Together, these ?findings may relate to changes of progressive cardiogenic failure ?and echocardiography may be more helpful for further evaluation. ?-Moderate multi-vessel coronary artery calcification. Mild ?cardiomegaly with left ventricular hypertrophy. ?-Grossly stable diffuse subcentimeter pulmonary nodularity, likely ?benign given its stability over time. See differential ?considerations above. ?-Interval development of a subacute manubrial fracture. 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 ?-Support tubes in appropriate position ? ?Assessment:  ?76 year old  woman, history of multiple myeloma, hypertension, hyperlipidemia who was admitted with respiratory distress and intubated for hypoxic respiratory failure in the ED-code stroke activated for staring spell which was presumed

## 2022-03-12 NOTE — Progress Notes (Signed)
Initial Nutrition Assessment ? ?DOCUMENTATION CODES:  ? ?Severe malnutrition in context of chronic illness ? ?INTERVENTION:  ? ?Vital 1.5@35ml/hr + ProSource TF 45ml BID via tube  ? ?Free water flushes 30ml q4 hours to maintain tube patency  ? ?Regimen provides 1340kcal/day, 79g/day protein and 821ml/day of free water  ? ?Liquid MVI daily via tube  ? ?Pt at high refeed risk; recommend monitor potassium, magnesium and phosphorus labs daily until stable ? ?NUTRITION DIAGNOSIS:  ? ?Severe Malnutrition related to chronic illness (CHF, poor oral intake) as evidenced by severe fat depletion, severe muscle depletion. ? ?GOAL:  ? ?Provide needs based on ASPEN/SCCM guidelines ? ?MONITOR:  ? ?Labs, Weight trends, Skin, I & O's, Vent status, TF tolerance ? ?REASON FOR ASSESSMENT:  ? ?Consult ?Enteral/tube feeding initiation and management ? ?ASSESSMENT:  ? 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 ? ?Pt sedated and ventilated. OGT in place. Plan is to start tube feeds today. Per chart, pt is down 5lbs(4%) over the past 3 weeks; this is significant weight loss. Suspect pt with poor appetite and oral intake at baseline. Pt is likely at high refeed risk. EEG pending.   ? ?Medications reviewed and include: plavix, colace, lovenox, insulin, protonix, miralax, lokelma,  ?Levophed ? ?Labs reviewed: Na 131(L), K 5.3(H), BUN 29(H), creat 1.32(H), P 3.2 wnl, Mg 2.1 wnl ?Wbc- 11.5(H), Hgb 10.8(L), Hct 30.6(L) ?Cbgs- 79, 93, 95, 106 x 24 hrs ? ?Patient is currently intubated on ventilator support ?MV: 8.3 L/min ?Temp (24hrs), Avg:98 ?F (36.7 ?C), Min:96.4 ?F (35.8 ?C), Max:99.8 ?F (37.7 ?C) ? ?Propofol: none  ? ?MAP- >65mmHg  ? ?UOP ? ?NUTRITION - FOCUSED PHYSICAL EXAM: ? ?Flowsheet Row Most Recent Value  ?Orbital Region Severe depletion  ?Upper Arm Region Severe depletion  ?Thoracic and Lumbar Region Moderate depletion  ?Buccal Region  Moderate depletion  ?Temple Region Moderate depletion  ?Clavicle Bone Region Severe depletion  ?Clavicle and Acromion Bone Region Severe depletion  ?Scapular Bone Region Moderate depletion  ?Dorsal Hand Moderate depletion  ?Patellar Region Severe depletion  ?Anterior Thigh Region Severe depletion  ?Posterior Calf Region Severe depletion  ?Edema (RD Assessment) Moderate  ?Hair Reviewed  ?Eyes Reviewed  ?Mouth Reviewed  ?Skin Reviewed  ?Nails Reviewed  ? ?Diet Order:   ?Diet Order   ? ?       ?  Diet NPO time specified  Diet effective now       ?  ? ?  ?  ? ?  ? ?EDUCATION NEEDS:  ? ?No education needs have been identified at this time ? ?Skin:  Skin Assessment: Reviewed RN Assessment (ecchymosis) ? ?Last BM:  3/19 ? ?Height:  ? ?Ht Readings from Last 1 Encounters:  ?03/10/22 5' 2" (1.575 m)  ? ? ?Weight:  ? ?Wt Readings from Last 1 Encounters:  ?03/12/22 47 kg  ? ? ?Ideal Body Weight:  50 kg ? ?BMI:  Body mass index is 18.95 kg/m?. ? ?Estimated Nutritional Needs:  ? ?Kcal:  1218kcal/day ? ?Protein:  70-80g/day ? ?Fluid:  1.2-1.4L/day ? ?Casey Campbell MS, RD, LDN ?Please refer to [facility]ION for RD and/or RD on-call/weekend/after hours pager ? ?

## 2022-03-12 NOTE — Progress Notes (Signed)
Eeg done 

## 2022-03-12 NOTE — Procedures (Signed)
Patient Name: Wendy [name]  ?MRN: [id number]  ?Epilepsy Attending: Lora Havens  ?Referring Physician/Provider: Tyler Pita, MD ?Date: 03/12/2022 ?Duration: 26.10 mins ? ?Patient history: 76 year old female with altered mental status.  EEG to evaluate for seizure. ? ?Level of alertness: comatose ? ?AEDs during EEG study: LEV, propofol ? ?Technical aspects: This EEG study was done with scalp electrodes positioned according to the 10-20 International system of electrode placement. Electrical activity was acquired at a sampling rate of '500Hz'$  and reviewed with a high frequency filter of '70Hz'$  and a low frequency filter of '1Hz'$ . EEG data were recorded continuously and digitally stored.  ? ?Description: EEG showed near continuous generalized 5 to 7 Hz theta slowing admixed with intermittent generalized low amplitude 15 to 18 Hz beta activity as well as intermittent generalized 2 to 3 Hz delta slowing. Hyperventilation and photic stimulation were not performed.    ? ?ABNORMALITY ?- Continuous slow, generalized ? ?IMPRESSION: ?This study is suggestive of moderate diffuse encephalopathy, nonspecific etiology. No seizures or definite epileptiform discharges were seen throughout the recording. ? ?Lora Havens  ? ?

## 2022-03-12 NOTE — Consult Note (Signed)
PHARMACY CONSULT NOTE ? ?Pharmacy Consult for Electrolyte Monitoring and Replacement  ? ?Recent Labs: ?Potassium (mmol/L)  ?Date Value  ?03/12/2022 5.3 (H)  ? ?Magnesium (mg/dL)  ?Date Value  ?03/12/2022 2.1  ? ?Calcium (mg/dL)  ?Date Value  ?03/12/2022 8.0 (L)  ? ?Albumin (g/dL)  ?Date Value  ?03/12/2022 2.8 (L)  ? ?Phosphorus (mg/dL)  ?Date Value  ?03/12/2022 3.2  ? ?Sodium (mmol/L)  ?Date Value  ?03/12/2022 131 (L)  ?11/03/2020 123 (L)  ? 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 Patient is currently intubated, sedated, and on mechanical ventilation in the ICU. Pharmacy consulted to assist with electrolyte monitoring and replacement as indicated. ? ?Goal of Therapy:  ?Electrolytes within normal limits ? ?Plan:  ?--Na 131, overall stable. Continue to monitor for now ?--K 5.3, likely secondary to worsening renal function. Patient has been started on Lokelma 10g daily. Continue to monitor while on propofol ?--Follow-up electrolytes with AM labs tomorrow ? ?Wendy [name] ?03/12/2022 7:35 AM  ?

## 2022-03-12 NOTE — Progress Notes (Addendum)
? ?NAME:  Wendy [name], MRN:  [id number], DOB:  [birth year], LOS: 1 ?ADMISSION DATE:  03/10/2022 ? ?SYNOPSIS ?76 yo F presenting to [facility] ED from home via EMS on 03/10/22 with complaints of respiratory distress. Per ED documentation, EMS found the patient dyspneic and hypoxic with SpO2 88% on RA, she was A&O x 4. She received duo-neb, solu medrol, Zofran & magnesium. 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 She reports since her mother switched to immunotherapy she had been doing better, no recent complaints of any kind. Of note, patient was on Eliquis for un-provoked bilateral DVT's until 02/22/22 when this medication was stopped. ? ? ?  ?ED course: ?Upon arrival to ED, as EMS was wheeling her into triage she appeared to have an abrupt change in mental status: staring off into space, nonverbal/unable to follow commands/unresponsive except for eyes being open and blinking to threat. CODE STROKE was initiated. When the patient returned from the [facility] wo contrast her respiratory status declined and her mentation had not improved, EDP made the decision to emergently intubate the patient placing her on mechanical ventilatory support. Tele-neurology assessed the patient and imaging deeming the patient NOT a candidate for TNKase and there was no LVO. ? ?PCCM consulted for admission due to acute respiratory failure in the setting of suspected acute ischemic stroke with emergent intubation requiring mechanical ventilatory support. In discussions bedside with the patient's daughter and EDP, the patient's DNR status was confirmed. Of note, the patient has become responsive, able to follow commands and nod somewhat appropriately. Amy, the patient's daughter would like to leave her mother on mechanical ventilatory support overnight. If she is unable to wean off mechanical ventilation in the morning, she would like to wait until a  follow up MRI can be performed before making the decision to extubate. ? ? ? ?Significant labs:  ?Hematology: WBC: 12.8, Hgb: 12.3,  ?Troponin: 46, BNP: 1562, PCT: pending, ?ABG: 7.37/ 41/ 65/ 23.7 ?CXR 03/10/22: Progressive peribronchial thickening suggestive of pulmonary edema. Bilateral pleural effusions ?CT head wo contrast 03/10/22: no acute intracranial abnormality. ASPECTS 10. ?CTa head/neck 03/11/22: No emergent large vessel occlusion or hemodynamically significant stenosis of the intracranial or cervical arteries. 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 Medium-sized pleural effusions with innumerable bilateral pulmonary nodules, too many to count.  ?CT angio chest 03/11/22: negative for PE. Enlarging bilateral pleural effusions, interval development of diffuse ground-glass pulmonary infiltrate most suggestive of alveolar pulmonary edema & development of diffuse body wall and retroperitoneal edema in keeping with anasarca. Moderate multi-vessel coronary artery calcification. Mild cardiomegaly with left ventricular hypertrophy. Grossly stable diffuse subcentimeter pulmonary nodularity. Interval development of a subacute manubrial fracture. Osteopenia. ?  ?MRI BRAIN +multiple acute ischemic infarcts frontal and parietal   ? ? ? ? ?Significant Hospital Events: ?Including procedures, antibiotic start and stop dates in addition to other pertinent events   ?6/07 acute metabolic encephalopathy and severe resp distress ?Admit to ICU with suspected TIA/ischemic CVA and acute hypoxic respiratory failure in the setting of pulmonary edema on mechanical ventilatory support ?3/20 remains critically ill, on vent ? ? ? ? ? ?Micro Data:  ?COVID-19 & Influenza A/B: negative ? ?Antimicrobials:  ? ?Antibiotics Given (last 72 hours)   ? ? None  ? ?  ? ? ? ? ? ? ? ?Interim History / Subjective:  ?Remains intubated,sedated ?On VENT support ?On pressors ?+renal failure due to ATN/contrast ? ?CBC ?   ?  Component Value Date/Time  ? WBC  11.5 (H) 03/12/2022 0533  ? RBC 3.51 (L) 03/12/2022 0533  ? HGB 10.8 (L) 03/12/2022 0533  ? HGB 11.0 (L) 02/22/2022 0853  ? HCT 30.6 (L) 03/12/2022 0533  ? PLT 353 03/12/2022 0533  ? PLT 445 (H) 02/22/2022 0853  ? MCV 87.2 03/12/2022 0533  ? MCH 30.8 03/12/2022 0533  ? MCHC 35.3 03/12/2022 0533  ? RDW 14.1 03/12/2022 0533  ? LYMPHSABS 0.9 03/12/2022 0533  ? MONOABS 1.0 03/12/2022 0533  ? EOSABS 0.0 03/12/2022 0533  ? BASOSABS 0.0 03/12/2022 0533  ? ?BMP Latest Ref Rng & Units 03/12/2022 03/11/2022 03/10/2022  ?Glucose 70 - 99 mg/dL 93 152(H) 220(H)  ?BUN 8 - 23 mg/dL 29(H) 20 20  ?Creatinine 0.44 - 1.00 mg/dL 1.32(H) 0.90 0.85  ?BUN/Creat Ratio 12 - 28 - - -  ?Sodium 135 - 145 mmol/L 131(L) 132(L) 133(L)  ?Potassium 3.5 - 5.1 mmol/L 5.3(H) 3.4(L) 4.0  ?Chloride 98 - 111 mmol/L 98 93(L) 98  ?CO2 22 - 32 mmol/L '25 25 26  ' ?Calcium 8.9 - 10.3 mg/dL 8.0(L) 9.1 9.1  ? ? ? ? ? ? ? ?Objective   ?Blood pressure (!) 85/55, pulse 62, temperature 99.8 ?F (37.7 ?C), temperature source Oral, resp. rate 20, height '5\' 2"'  (1.575 m), weight 47 kg, SpO2 100 %. ?   ?Vent Mode: PRVC ?FiO2 (%):  [30 %] 30 % ?Set Rate:  [20 bmp] 20 bmp ?Vt Set:  [420 mL] 420 mL ?PEEP:  [5 cmH20] 5 cmH20 ?Plateau Pressure:  [18 cmH20-20 cmH20] 18 cmH20  ? ?Intake/Output Summary (Last 24 hours) at 03/12/2022 0736 ?Last data filed at 03/12/2022 0600 ?Gross per 24 hour  ?Intake 819.7 ml  ?Output 1085 ml  ?Net -265.3 ml  ? ?Filed Weights  ? 03/10/22 2239 03/11/22 0428 03/12/22 0400  ?Weight: 50 kg 48.2 kg 47 kg  ? ? ? ? ?REVIEW OF SYSTEMS ? ?PATIENT IS UNABLE TO PROVIDE COMPLETE REVIEW OF SYSTEMS DUE TO SEVERE CRITICAL ILLNESS AND TOXIC METABOLIC ENCEPHALOPATHY ? ?ALL OTHER ROS ARE NEGATIVE ? ? ?PHYSICAL EXAMINATION: ? ?GENERAL:critically ill appearing, +resp distress ?EYES: Pupils equal, round, reactive to light.  No scleral icterus.  ?MOUTH: Moist mucosal membrane. INTUBATED ?NECK: Supple.  ?PULMONARY: +rhonchi, +wheezing ?CARDIOVASCULAR: S1 and S2.  No murmurs   ?GASTROINTESTINAL: Soft, nontender, -distended. Positive bowel sounds.  ?MUSCULOSKELETAL: No swelling, clubbing, or edema.  ?NEUROLOGIC: obtunded ?SKIN:intact,warm,dry ? ? ? ?Labs/imaging that I havepersonally reviewed  ?(right click and "Reselect all SmartList Selections" daily)  ? ? ?ASSESSMENT AND PLAN ?SYNOPSIS ? ?76 yo WF admitted to ICU for acute hypoxic resp failure with acute CHF HFpEF exacerbation and inability to protect airway with acute CVA ? ? ?Severe ACUTE Hypoxic and Hypercapnic Respiratory Failure pleural effusion and edema ?-continue Mechanical Ventilator support ?-continue Bronchodilator Therapy ?-Wean Fio2 and PEEP as tolerated ?-VAP/VENT bundle implementation ?-will perform SAT/SBT when respiratory parameters are met and whne family at bedside ? ?Vent Mode: PRVC ?FiO2 (%):  [30 %] 30 % ?Set Rate:  [20 bmp] 20 bmp ?Vt Set:  [420 mL] 420 mL ?PEEP:  [5 cmH20] 5 cmH20 ?Plateau Pressure:  [18 cmH20-20 cmH20] 18 cmH20 ? ? ?CARDIAC FAILURE-HFpEF with pulmonary edema ?-oxygen as needed ?-Lasix as tolerated ?-follow up cardiac enzymes as indicated ? ? ?CARDIAC ?ICU monitoring ? ? ?ACUTE KIDNEY INJURY/Renal Failure ?-continue Foley Catheter-assess need ?-Avoid nephrotoxic agents ?-Follow urine output, BMP ?-Ensure adequate renal perfusion, optimize oxygenation ?-Renal dose medications ? ? ?Intake/Output  Summary (Last 24 hours) at 03/12/2022 0736 ?Last data filed at 03/12/2022 0600 ?Gross per 24 hour  ?Intake 819.7 ml  ?Output 1085 ml  ?Net -265.3 ml  ? ? ? ?NEUROLOGY ?Acute toxic metabolic encephalopathy due to  ?Acute CVA based on MRI results ?need for sedation,Goal RASS 0 ?Not a candidate for TNKase ?CTH: negative, ASPECTS 10, CTangio head/neck/perfusion: negative for LVO, incidental aneurysm.  ?- Neuro checks Q 2 x 24 hours ?- NIHSS Q shift ?- Neurology consulted, appreciate input ?- consider f/u MRI/MRA   ?- f/u Lipid panel & Hgb A1c, Euglycemia > SSI moderate ordered, target range 140-180 ?- restart  outpatient Crestor ?-Lovenox for VTE prophylaxis ?- falls precaution ?NEEDS EEG ASAP ?Case discussed with Neurologist ? ?  ? ?INFECTIOUS DISEASE ?WILL NEED TO CHECK FOR COVID ? ? ? ?SHOCK ?SOURCE-hypovolemia ?

## 2022-03-12 NOTE — TOC Initial Note (Signed)
Transition of Care (TOC) - Initial/Assessment Note  ? ? ?Patient Details  ?Name: Wendy [name]. Rampersaud ?MRN: [id number] ?Date of Birth: 22-Sep-1946 ? ?Transition of Care (TOC) CM/SW Contact:    ?Anselm Pancoast, RN ?Phone Number: ?03/12/2022, 1:45 PM ? ?Clinical Narrative:                 ?Family requesting patient be monitored overnight prior to any decisions regarding extubation being made. Family has confirmed DNR status at this time. TOC will follow and assist as needed. ? ?  ?  ? ? ?Patient Goals and CMS Choice ?  ?  ?  ? ?Expected Discharge Plan and Services ?  ?  ?  ?  ?  ?                ?  ?  ?  ?  ?  ?  ?  ?  ?  ?  ? ?Prior Living Arrangements/Services ?  ?  ?  ?       ?  ?  ?  ?  ? ?Activities of Daily Living ?Home Assistive Devices/Equipment: Chana Bode (specify type) ?ADL Screening (condition at time of admission) ?Patient's cognitive ability adequate to safely complete daily activities?: Yes (IN ED) ?Is the patient deaf or have difficulty hearing?: No ?Does the patient have difficulty seeing, even when wearing glasses/contacts?: Yes ?Does the patient have difficulty concentrating, remembering, or making decisions?: No ?Patient able to express need for assistance with ADLs?: Yes ?Does the patient have difficulty dressing or bathing?: No ?Independently performs ADLs?: Yes (appropriate for developmental age) ?Does the patient have difficulty walking or climbing stairs?: No ?Weakness of Legs: None ?Weakness of Arms/Hands: None ? ?Permission Sought/Granted ?  ?  ?   ?   ?   ?   ? ?Emotional Assessment ?  ?  ?  ?  ?  ?  ? ?Admission diagnosis:  Acute respiratory failure ([place]) [J96.00] ?Acute respiratory failure with hypoxia ([place]) [J96.01] ?Altered mental status, unspecified altered mental status type [R41.82] ?Acute congestive heart failure, unspecified heart failure type ([place]) [I50.9] ?Patient Active Problem List  ? Diagnosis Date Noted  ? Acute congestive heart failure ([place])   ? Altered mental status   ? Acute  respiratory failure ([place]) 03/11/2022  ? Pain in buttock 02/09/2022  ? Low back pain 02/09/2022  ? Poor balance 02/09/2022  ? History of fall 02/09/2022  ? Generalized weakness 02/09/2022  ? Stress reaction 12/24/2021  ? Current use of proton pump inhibitor 10/27/2021  ? Full code status 05/31/2021  ? Flash pulmonary edema ([place]) 04/30/2021  ? Heme positive stool 04/30/2021  ? Hyperkalemia 04/30/2021  ? Compression fracture of T10 vertebra ([place]) 03/14/2021  ? History of DVT (deep vein thrombosis) 03/13/2021  ? Multiple myeloma ([place]) 02/10/2021  ? Leucocytosis 02/10/2021  ? 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 Situational anxiety 01/11/2021  ? 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  ? Hyperlipidemia 10/19/2020  ? Hearing loss 06/14/2020  ? Pedal edema 06/01/2020  ? Aortic atherosclerosis ([place]) 04/06/2020  ? CAD (coronary artery disease) 04/06/2020  ? H/O compression fracture of spine 04/06/2020  ? Chronic back pain 04/06/2020  ? Abnormal CT scan of lung 03/17/2020  ? Heartburn 06/02/2018  ? Chronic constipation 12/31/2017  ? Blood glucose elevated 09/25/2017  ? History of ileus 06/07/2017  ? Right carpal tunnel syndrome 12/07/2016  ? Estrogen deficiency 09/24/2016  ? Routine general medical examination at a health care facility 09/04/2015  ? Colon  cancer screening 12/11/2014  ? Encounter for Medicare annual wellness exam 05/17/2013  ? Osteoarthritis 03/28/2011  ? Degenerative disc disease, lumbar 03/28/2011  ? Hyponatremia 02/12/2011  ? Essential hypertension 08/01/2010  ? Osteoporosis 08/01/2010  ? ?PCP:  Abner Greenspan, MD ?Pharmacy:   ?[facility] [address], [place] ?[address] ?[place] [address] ?Phone: [phone] Fax: [phone] ? ?[id number]?Phone: [phone]: [phone]? ?[id number] [place],  [id number]: [phone]: [phone] ? ? ? ?Social Determinants of Health (SDOH) Interventions ?  ? ?Readmission Risk Interventions ?No flowsheet data found. ? ? ?

## 2022-03-13 ENCOUNTER — Encounter
Admission: EM | Disposition: A | Discharge status: Skilled Nursing Facility | Payer: Self-pay | Source: Home / Self Care | Attending: Internal Medicine

## 2022-03-13 ENCOUNTER — Inpatient Hospital Stay (HOSPITAL_COMMUNITY)
Admit: 2022-03-13 | Discharge: 2022-03-13 | Disposition: A | Discharge status: Home / Self Care | Payer: Medicare Other | Attending: Cardiovascular Disease | Admitting: Cardiovascular Disease

## 2022-03-13 ENCOUNTER — Inpatient Hospital Stay: Payer: Medicare Other

## 2022-03-13 DIAGNOSIS — I6389 Other cerebral infarction: Secondary | ICD-10-CM

## 2022-03-13 DIAGNOSIS — E43 Unspecified severe protein-calorie malnutrition: Secondary | ICD-10-CM | POA: Diagnosis not present

## 2022-03-13 DIAGNOSIS — I340 Nonrheumatic mitral (valve) insufficiency: Secondary | ICD-10-CM | POA: Diagnosis not present

## 2022-03-13 DIAGNOSIS — I639 Cerebral infarction, unspecified: Secondary | ICD-10-CM | POA: Diagnosis not present

## 2022-03-13 DIAGNOSIS — J9601 Acute respiratory failure with hypoxia: Secondary | ICD-10-CM | POA: Diagnosis not present

## 2022-03-13 DIAGNOSIS — R4182 Altered mental status, unspecified: Secondary | ICD-10-CM | POA: Diagnosis not present

## 2022-03-13 LAB — HEMOGLOBIN A1C
Hgb A1c MFr Bld: 6 % — ABNORMAL HIGH (ref 4.8–5.6)
Mean Plasma Glucose: 126 mg/dL

## 2022-03-13 LAB — CBC WITH DIFFERENTIAL/PLATELET
Abs Immature Granulocytes: 0.04 10*3/uL (ref 0.00–0.07)
Basophils Absolute: 0 10*3/uL (ref 0.0–0.1)
Basophils Relative: 0 %
Eosinophils Absolute: 0 10*3/uL (ref 0.0–0.5)
Eosinophils Relative: 0 %
HCT: 35.7 % — ABNORMAL LOW (ref 36.0–46.0)
Hemoglobin: 11.7 g/dL — ABNORMAL LOW (ref 12.0–15.0)
Immature Granulocytes: 0 %
Lymphocytes Relative: 7 %
Lymphs Abs: 0.8 10*3/uL (ref 0.7–4.0)
MCH: 29.9 pg (ref 26.0–34.0)
MCHC: 32.8 g/dL (ref 30.0–36.0)
MCV: 91.3 fL (ref 80.0–100.0)
Monocytes Absolute: 0.5 10*3/uL (ref 0.1–1.0)
Monocytes Relative: 5 %
Neutro Abs: 8.9 10*3/uL — ABNORMAL HIGH (ref 1.7–7.7)
Neutrophils Relative %: 88 %
Platelets: 344 10*3/uL (ref 150–400)
RBC: 3.91 MIL/uL (ref 3.87–5.11)
RDW: 14.6 % (ref 11.5–15.5)
WBC: 10.3 10*3/uL (ref 4.0–10.5)
nRBC: 0 % (ref 0.0–0.2)

## 2022-03-13 LAB — GLUCOSE, CAPILLARY
Glucose-Capillary: 87 mg/dL (ref 70–99)
Glucose-Capillary: 87 mg/dL (ref 70–99)
Glucose-Capillary: 83 mg/dL (ref 70–99)
Glucose-Capillary: 81 mg/dL (ref 70–99)
Glucose-Capillary: 66 mg/dL — ABNORMAL LOW (ref 70–99)
Glucose-Capillary: 72 mg/dL (ref 70–99)
Glucose-Capillary: 74 mg/dL (ref 70–99)

## 2022-03-13 LAB — PHOSPHORUS: Phosphorus: 3.6 mg/dL (ref 2.5–4.6)

## 2022-03-13 LAB — BASIC METABOLIC PANEL
Anion gap: 9 (ref 5–15)
BUN: 30 mg/dL — ABNORMAL HIGH (ref 8–23)
CO2: 27 mmol/L (ref 22–32)
Calcium: 8.3 mg/dL — ABNORMAL LOW (ref 8.9–10.3)
Chloride: 98 mmol/L (ref 98–111)
Creatinine, Ser: 1.39 mg/dL — ABNORMAL HIGH (ref 0.44–1.00)
GFR, Estimated: 39 mL/min — ABNORMAL LOW (ref >60)
Glucose, Bld: 111 mg/dL — ABNORMAL HIGH (ref 70–99)
Potassium: 4.9 mmol/L (ref 3.5–5.1)
Sodium: 134 mmol/L — ABNORMAL LOW (ref 135–145)

## 2022-03-13 LAB — T3, FREE: T3, Free: 2.8 pg/mL (ref 2.0–4.4)

## 2022-03-13 LAB — PROCALCITONIN: Procalcitonin: 2.35 ng/mL

## 2022-03-13 LAB — MAGNESIUM: Magnesium: 2.3 mg/dL (ref 1.7–2.4)

## 2022-03-13 IMAGING — US US EXTREM LOW VENOUS
1 series · 13 of 24 positions shown · non-contrast
Comparison: None.

CLINICAL DATA: Bilateral lower extremity swelling.



[Series 1: us venous img lower bilat (dvt) · portal-venous · 13 of 71 slices shown]
[im 1/71]
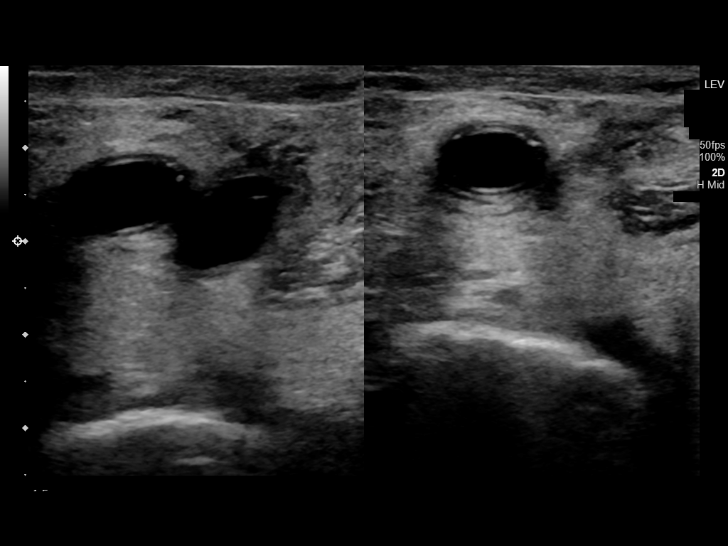
[im 7/71]
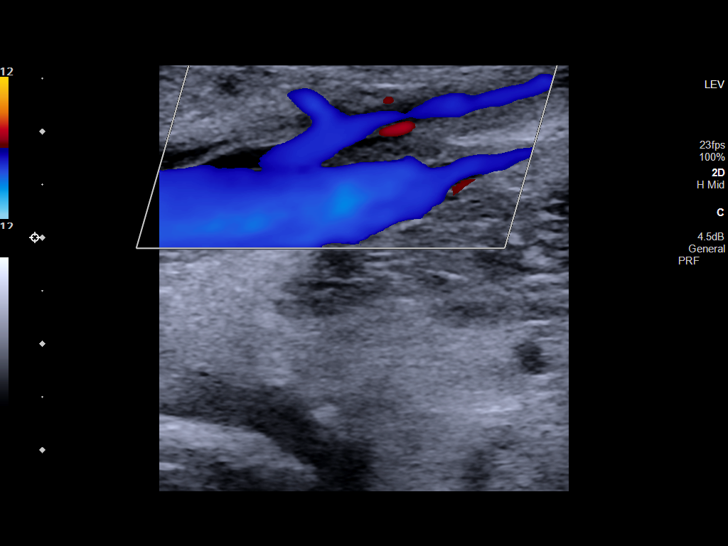
[im 13/71]
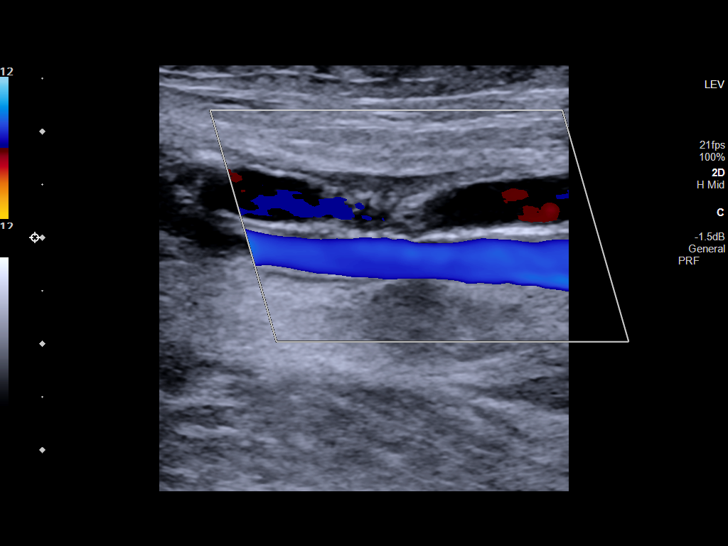
[im 19/71]
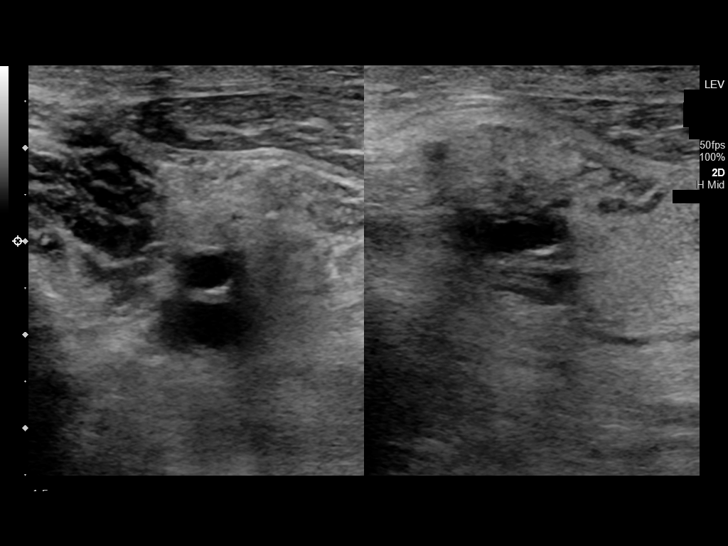
[im 25/71]
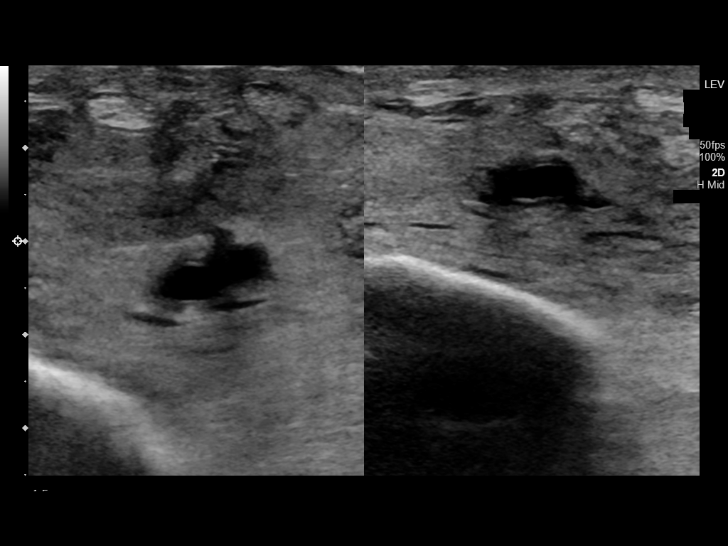
[im 31/71]
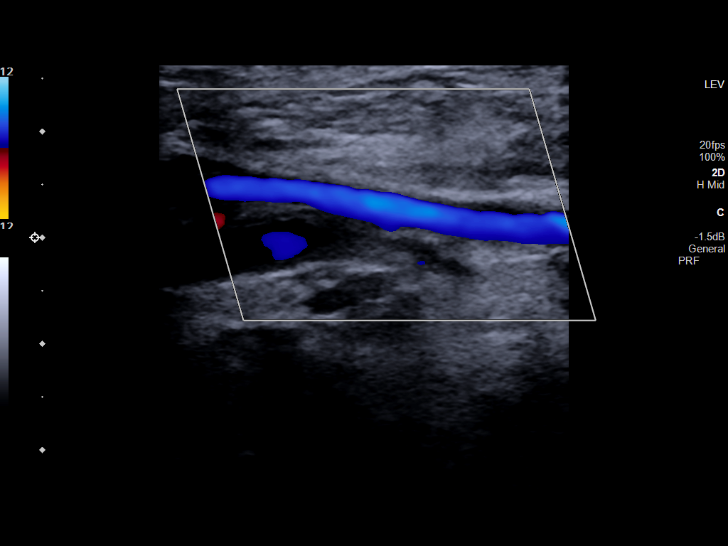
[im 37/71]
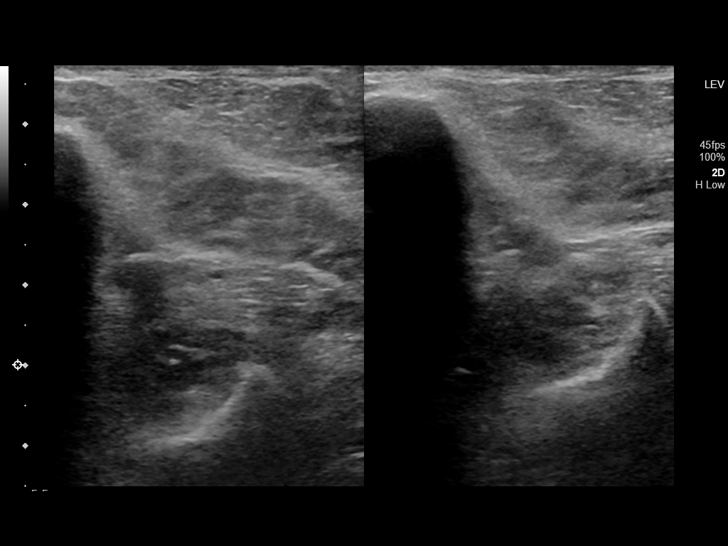
[im 40/71]
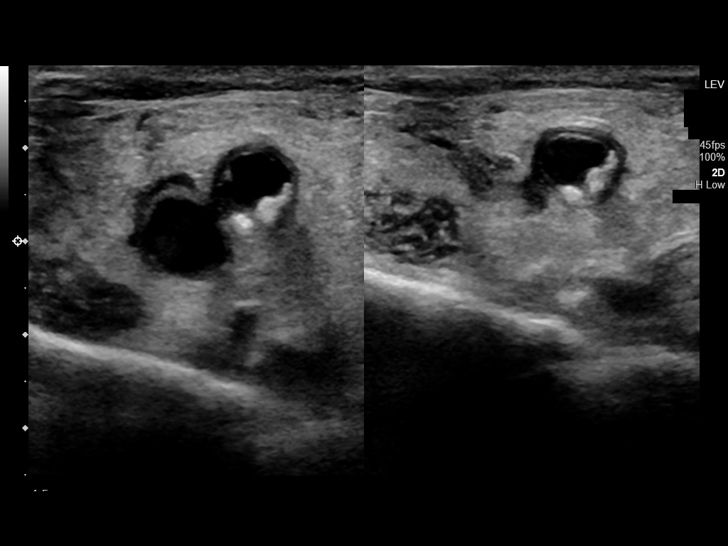
[im 46/71]
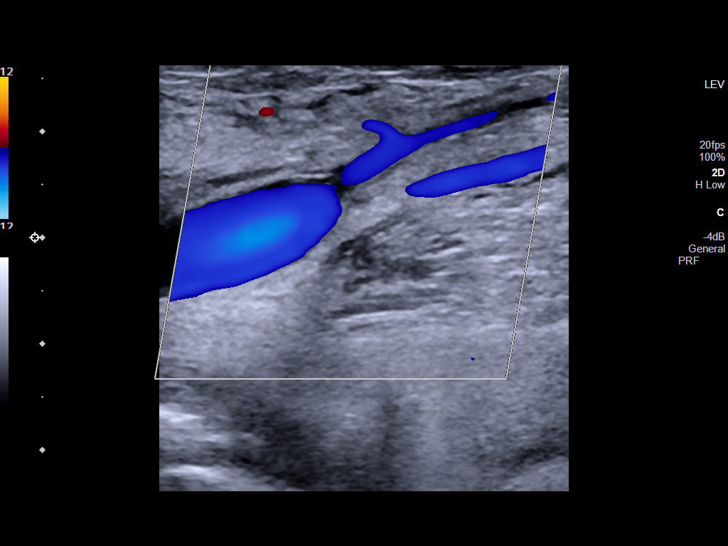
[im 52/71]
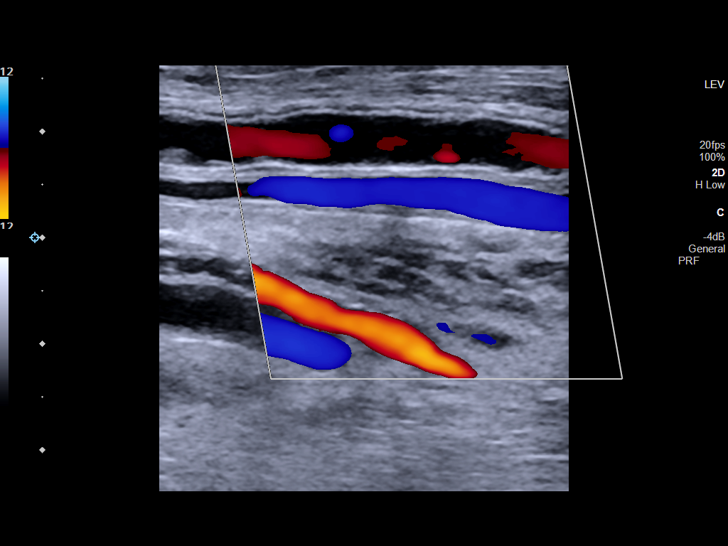
[im 58/71]
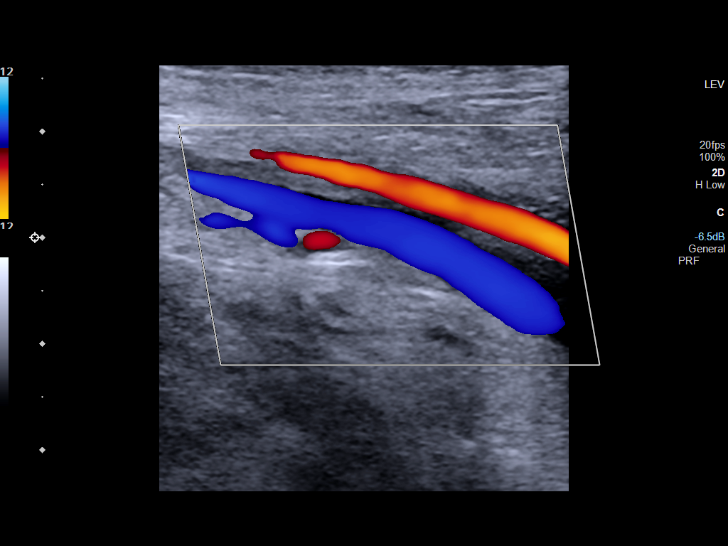
[im 64/71]
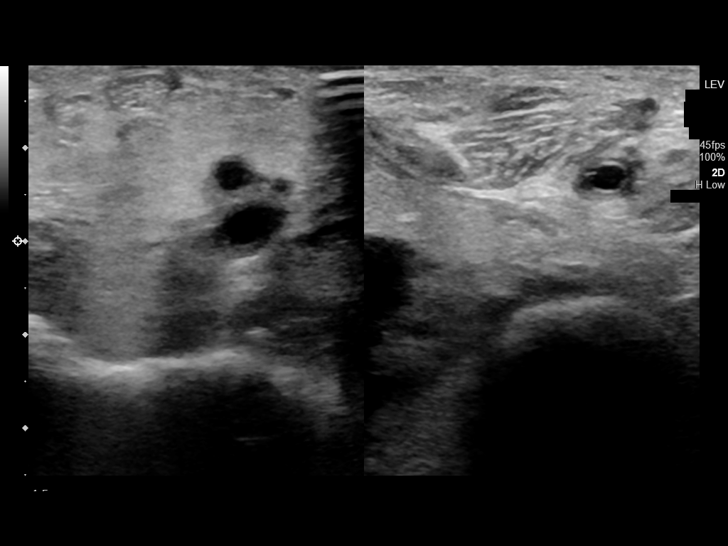
[im 71/71]
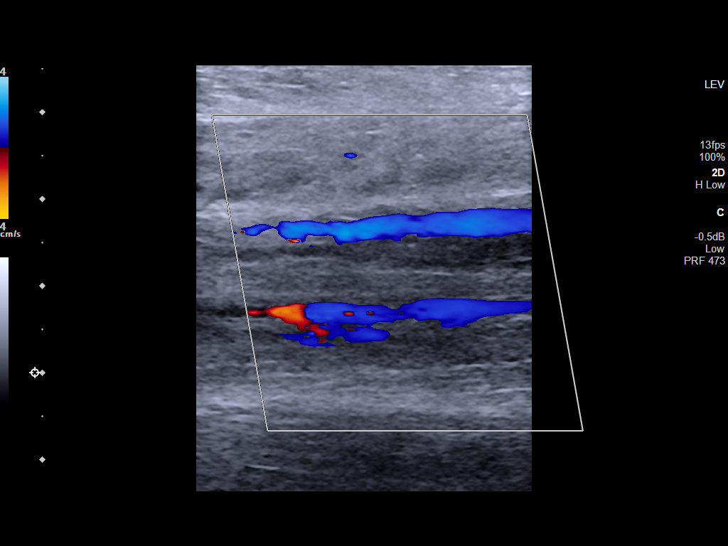

[13 of 24 positions shown; findings below may reference images not displayed]

FINDINGS: RIGHT LOWER EXTREMITY

Common Femoral Vein: No evidence of thrombus. Normal
compressibility, respiratory phasicity and response to augmentation.

Saphenofemoral Junction: No evidence of thrombus. Normal
compressibility and flow on color Doppler imaging.

Profunda Femoral Vein: No evidence of thrombus. Normal
compressibility and flow on color Doppler imaging.

Femoral Vein: No evidence of thrombus. Normal compressibility,
respiratory phasicity and response to augmentation.

Popliteal Vein: No evidence of thrombus. Normal compressibility,
respiratory phasicity and response to augmentation.

Calf Veins: No evidence of thrombus. Normal compressibility and flow
on color Doppler imaging.

Venous Reflux:  None.

Other Findings:  None.

LEFT LOWER EXTREMITY

Common Femoral Vein: No evidence of thrombus. Normal
compressibility, respiratory phasicity and response to augmentation.

Saphenofemoral Junction: No evidence of thrombus. Normal
compressibility and flow on color Doppler imaging.

Profunda Femoral Vein: No evidence of thrombus. Normal
compressibility and flow on color Doppler imaging.

Femoral Vein: No evidence of thrombus. Normal compressibility,
respiratory phasicity and response to augmentation.

Popliteal Vein: No evidence of thrombus. Normal compressibility,
respiratory phasicity and response to augmentation.

Calf Veins: No evidence of thrombus. Normal compressibility and flow
on color Doppler imaging.

Venous Reflux:  None.

Other Findings:  None.
IMPRESSION: No evidence of deep venous thrombosis in either lower extremity.

## 2022-03-13 SURGERY — ECHOCARDIOGRAM, TRANSESOPHAGEAL
Anesthesia: General

## 2022-03-13 MED ORDER — ATORVASTATIN CALCIUM 20 MG PO TABS
40.0000 mg | ORAL_TABLET | Freq: Every day | ORAL | Status: DC
  Administered 2022-03-13 – 2022-03-14 (×2): 40 mg
  Filled 2022-03-13 (×2): qty 2

## 2022-03-13 MED ORDER — VECURONIUM BROMIDE 10 MG IV SOLR
10.0000 mg | Freq: Once | INTRAVENOUS | Status: DC
  Filled 2022-03-13: qty 10

## 2022-03-13 MED ORDER — SODIUM CHLORIDE 0.9 % IV SOLN: INTRAVENOUS | Status: DC

## 2022-03-13 MED ORDER — VECURONIUM BROMIDE 10 MG IV SOLR
10.0000 mg | INTRAVENOUS | Status: DC | PRN
  Administered 2022-03-13: 10 mg via INTRAVENOUS

## 2022-03-13 MED ORDER — LEVETIRACETAM IN NACL 500 MG/100ML IV SOLN
500.0000 mg | Freq: Two times a day (BID) | INTRAVENOUS | Status: DC
  Administered 2022-03-14 – 2022-03-17 (×7): 500 mg via INTRAVENOUS
  Filled 2022-03-13 (×7): qty 100

## 2022-03-13 MED ORDER — LEVETIRACETAM IN NACL 1000 MG/100ML IV SOLN
1000.0000 mg | INTRAVENOUS | Status: AC
  Administered 2022-03-13: 1000 mg via INTRAVENOUS
  Filled 2022-03-13: qty 100

## 2022-03-13 MED ORDER — PROPOFOL 1000 MG/100ML IV EMUL
5.0000 ug/kg/min | INTRAVENOUS | Status: DC
  Administered 2022-03-13: 10 ug/kg/min via INTRAVENOUS
  Administered 2022-03-14: 5 ug/kg/min via INTRAVENOUS
  Filled 2022-03-13: qty 100

## 2022-03-13 NOTE — Progress Notes (Addendum)
Transesophageal Echocardiogram : ? ?Indication: Embolic strokes ?Requesting/ordering  physician: Dr. Mortimer Fries ? ?Procedure: Benzocaine spray x2 and 2 mls x 2 of viscous lidocaine were given orally to provide local anesthesia to the oropharynx. The patient was positioned supine on the left side, bite block provided. The patient was moderately sedated with the doses of versed and fentanyl as detailed below.  Using digital technique an omniplane probe was advanced into the distal esophagus without incident.  ? ?Moderate sedation: ?1. Sedation used: Vecuronium 10 mg IV ?2. Time administered:   1240 time when patient started recovery: 1:20 PM ?Total sedation time 40 minutes ?3. I was face to face during this time ? ?See report in EPIC  for complete details: ?In brief, transgastric imaging revealed normal LV function with no RWMAs and no mural apical thrombus.  .  Estimated ejection fraction was 55%.  Right sided cardiac chambers were normal with no evidence of pulmonary hypertension. ? ?Imaging of the septum showed no ASD or VSD ?Bubble study was negative for shunt ?2D and color flow confirmed no PFO ? ?The LA was well visualized in orthogonal views.  There was no spontaneous contrast and no thrombus in the LA and LA appendage  ? ?Moderate diffuse calcific atherosclerosis plaque in the aortic arch, descending aorta ? ? ?Ida Rogue ?03/13/2022 ?1:44 PM ? ?

## 2022-03-13 NOTE — Progress Notes (Signed)
PT Cancellation Note ? ?Patient Details ?Name: Wendy [name] ?MRN: [id number] ?DOB: 1946-12-13 ? ? ?Cancelled Treatment:    Reason Eval/Treat Not Completed: Medical issues which prohibited therapy ?Patient remains sedated on the ventilator and is not a candidate for PT at this time per secure chart with Dr Mortimer Fries. PT will sign off for now. Please re-order when medically ready.  ? ?Minna Merritts, PT, MPT ? ?[name] ?03/13/2022, 9:47 AM ?

## 2022-03-13 NOTE — Progress Notes (Signed)
Eeg done 

## 2022-03-13 NOTE — Procedures (Signed)
Patient Name: Wendy [name]  ?MRN: [id number]  ?Epilepsy Attending: Lora Havens  ?Referring Physician/Provider: Tyler Pita, MD ?Date: 03/13/2022 ?Duration: 29.11 mins ?  ?Patient history: 76 year old female with altered mental status.  EEG to evaluate for seizure. ?  ?Level of alertness: comatose ?  ?AEDs during EEG study: propofol ?  ?Technical aspects: This EEG study was done with scalp electrodes positioned according to the 10-20 International system of electrode placement. Electrical activity was acquired at a sampling rate of '500Hz'$  and reviewed with a high frequency filter of '70Hz'$  and a low frequency filter of '1Hz'$ . EEG data were recorded continuously and digitally stored.  ?  ?Description: EEG showed continuous generalized 3-'6hz'$  theta-delta slowing. Generalized periodic discharges with triphasic morphology at '2hz'$  were also noted. Hyperventilation and photic stimulation were not performed.    ?  ?ABNORMALITY ?- Periodic discharges with triphasic morphology, generalized ?- Continuous slow, generalized ?  ?IMPRESSION: ?This study showed generalized periodic discharges with triphasic morphology at '2hz'$  which is on the ictal-interictal continuum with higher potential for seizures. This eeg pattern can also be seen due to toxic-metabolic causes, cefepime toxicity, anoxic brain injury.  Additionally there is moderate to severe diffuse encephalopathy, nonspecific etiology. No seizures were seen throughout the recording. ?  ?Lora Havens  ?

## 2022-03-13 NOTE — Progress Notes (Signed)
TEE planned for this afternoon.  I have ordered an EEG which will be done after the TEE. ?I want minimal to no sedation for the spot EEG.  For now, she remains on sedation in preparation for a TEE which will require her to be properly sedated. ?I will follow her up after the procedures are completed.  Plan was discussed with Dr. Mortimer Fries, who also reported that the RN overnight reported that the patient was following some commands of sedation. ?I will try to examine her when sedation has been held for a decent amount of time to get the best exam that I can because my exam from yesterday was not very impressive. ? ? ?-- ?Amie Portland, MD ?Neurologist ?[facility] ?Pager: [phone] ? ?

## 2022-03-13 NOTE — Progress Notes (Signed)
? ? ?  [facility] has been requested to perform a transesophageal echocardiogram on Wendy [name] for stroke.  After careful review of history and examination, the risks and benefits of transesophageal echocardiogram have been explained to the patient's daughter Darnell Level, including risks of esophageal damage, perforation (1:10,000 risk), bleeding, pharyngeal hematoma as well as other potential complications associated with conscious sedation including aspiration, arrhythmia, respiratory failure and death. Alternatives to treatment were discussed, questions were answered. Patient's daughter is willing to proceed. Patient is intubated and sedated. ? ?Suzanne Kho Ninfa Meeker, PA-C  ?03/13/2022 9:37 AM   ?

## 2022-03-13 NOTE — Progress Notes (Addendum)
Neurology Progress Note ? ? ?S:// ?Seen and examined after TEE for which she was given vecuronium ?Limited exam due to paralytic. ?EEG in process ? ? ?O:// ?Current vital signs: ?BP 138/61   Pulse 81   Temp 98.8 ?F (37.1 ?C)   Resp 20   Ht _0  (1.575 m)   Wt 48.7 kg   SpO2 99%   BMI 19.64 kg/m?  ?Vital signs in last 24 hours: ?Temp:  [95.7 ?F (35.4 ?C)-100.6 ?F (38.1 ?C)] 98.8 ?F (37.1 ?C) (03/21 0700) ?Pulse Rate:  [46-98] 81 (03/21 0700) ?Resp:  [16-22] 20 (03/21 0700) ?BP: (78-179)/(40-113) 138/61 (03/21 0700) ?SpO2:  [99 %-100 %] 99 % (03/21 0600) ?FiO2 (%):  [30 %] 30 % (03/21 0600) ?Weight:  [48.7 kg] 48.7 kg (03/21 0500) ?GEN: NAD ?CVS: RRR ?Resp: vented ?Abd: ND NT ?Limited neurological exam-was given rocuronium for TEE ?Pupils equal round sluggishly reactive ?No response to noxious stimulation ?Breathing with the vent ? ? ?Medications ? ?Current Facility-Administered Medications:  ?  0.9 %  sodium chloride infusion, 250 mL, Intravenous, Continuous, Teressa Lower, NP ?  0.9 %  sodium chloride infusion, , Intravenous, Continuous, Furth, Cadence H, PA-C, Last Rate: 20 mL/hr at 03/13/22 0808, New Bag at 03/13/22 0808 ?  acetaminophen (TYLENOL) tablet 650 mg, 650 mg, Per Tube, Q6H PRN, Rust-Chester, Toribio Harbour L, NP ?  albuterol (PROVENTIL) (2.5 MG/3ML) 0.083% nebulizer solution 2.5 mg, 2.5 mg, Nebulization, Q4H PRN, Rust-Chester, Toribio Harbour L, NP ?  chlorhexidine gluconate (MEDLINE KIT) (PERIDEX) 0.12 % solution 15 mL, 15 mL, Mouth Rinse, BID, Tyler Pita, MD, 15 mL at 03/13/22 0750 ?  Chlorhexidine Gluconate Cloth 2 % PADS 6 each, 6 each, Topical, Daily, Tyler Pita, MD, 6 each at 03/13/22 0543 ?  clopidogrel (PLAVIX) tablet 75 mg, 75 mg, Per Tube, Daily, Derek Jack, MD, 75 mg at 03/12/22 1022 ?  docusate (COLACE) 50 MG/5ML liquid 100 mg, 100 mg, Per Tube, BID, Rust-Chester, Britton L, NP, 100 mg at 03/12/22 2210 ?  enoxaparin (LOVENOX) injection 30 mg, 30 mg, Subcutaneous, Q24H,  [name], RPH, 30 mg at 03/12/22 1022 ?  feeding supplement (PROSource TF) liquid 45 mL, 45 mL, Per Tube, BID, Flora Lipps, MD, 45 mL at 03/12/22 2211 ?  feeding supplement (VITAL 1.5 CAL) liquid 1,000 mL, 1,000 mL, Per Tube, Q24H, Flora Lipps, MD, Last Rate: 35 mL/hr at 03/12/22 2000, Infusion Verify at 03/12/22 2000 ?  fentaNYL (SUBLIMAZE) bolus via infusion 25-100 mcg, 25-100 mcg, Intravenous, Q15 min PRN, Rust-Chester, Britton L, NP, 50 mcg at 03/11/22 1613 ?  fentaNYL 2558mg in NS 2555m(1032mml) infusion-PREMIX, 25-200 mcg/hr, Intravenous, Continuous, Rust-Chester, Britton L, NP, Last Rate: 15 mL/hr at 03/13/22 0600, 150 mcg/hr at 03/13/22 0600 ?  free water 30 mL, 30 mL, Per Tube, Q4H, KasFlora LippsD, 30 mL at 03/13/22 0817 ?  insulin aspart (novoLOG) injection 0-15 Units, 0-15 Units, Subcutaneous, Q4H, SmiLucrezia StarchD, 2 Units at 03/11/22 0125 ?  MEDLINE mouth rinse, 15 mL, Mouth Rinse, 10 times per day, GonTyler PitaD, 15 mL at 03/13/22 0543 ?  midazolam (VERSED) injection 1 mg, 1 mg, Intravenous, Q15 min PRN, Rust-Chester, BriToribio Harbour NP ?  midazolam (VERSED) injection 1 mg, 1 mg, Intravenous, Q2H PRN, Rust-Chester, Britton L, NP ?  midodrine (PROAMATINE) tablet 5 mg, 5 mg, Per Tube, TID WC, Kasa, Kurian, MD, 5 mg at 03/13/22 0748 ?  multivitamin liquid 15 mL, 15 mL, Per Tube, Daily, Kasa,  Maretta Bees, MD ?  norepinephrine (LEVOPHED) 37m in 2560m(0.016 mg/mL) premix infusion, 2-10 mcg/min, Intravenous, Titrated, NeTeressa LowerNP, Last Rate: 7.5 mL/hr at 03/13/22 0600, 2 mcg/min at 03/13/22 0600 ?  pantoprazole sodium (PROTONIX) 40 mg/20 mL oral suspension 40 mg, 40 mg, Per Tube, Daily, Rust-Chester, Britton L, NP, 40 mg at 03/12/22 1028 ?  polyethylene glycol (MIRALAX / GLYCOLAX) packet 17 g, 17 g, Per Tube, Daily, Rust-Chester, Britton L, NP, 17 g at 03/12/22 1028 ?  propofol (DIPRIVAN) 1000 MG/100ML infusion, 5-80 mcg/kg/min, Intravenous, Titrated, Kasa, Kurian, MD, Last Rate:  2.92 mL/hr at 03/13/22 0805, 10 mcg/kg/min at 03/13/22 0805 ?  rosuvastatin (CRESTOR) tablet 10 mg, 10 mg, Per Tube, Daily, Rust-Chester, Britton L, NP, 10 mg at 03/12/22 1022 ? ?Labs ?CBC ?   ?Component Value Date/Time  ? WBC 10.3 03/13/2022 0419  ? RBC 3.91 03/13/2022 0419  ? HGB 11.7 (L) 03/13/2022 0419  ? HGB 11.0 (L) 02/22/2022 0853  ? HCT 35.7 (L) 03/13/2022 0419  ? PLT 344 03/13/2022 0419  ? PLT 445 (H) 02/22/2022 0853  ? MCV 91.3 03/13/2022 0419  ? MCH 29.9 03/13/2022 0419  ? MCHC 32.8 03/13/2022 0419  ? RDW 14.6 03/13/2022 0419  ? LYMPHSABS 0.8 03/13/2022 0419  ? MONOABS 0.5 03/13/2022 0419  ? EOSABS 0.0 03/13/2022 0419  ? BASOSABS 0.0 03/13/2022 0419  ? ?CMP  ?   ?Component Value Date/Time  ? NA 134 (L) 03/13/2022 0419  ? NA 123 (L) 11/03/2020 1103  ? K 4.9 03/13/2022 0419  ? CL 98 03/13/2022 0419  ? CO2 27 03/13/2022 0419  ? GLUCOSE 111 (H) 03/13/2022 0419  ? BUN 30 (H) 03/13/2022 0419  ? BUN 10 11/03/2020 1103  ? CREATININE 1.39 (H) 03/13/2022 0419  ? CREATININE 0.85 02/22/2022 0853  ? CALCIUM 8.3 (L) 03/13/2022 0419  ? PROT 5.2 (L) 03/12/2022 0533  ? ALBUMIN 2.8 (L) 03/12/2022 0533  ? AST 15 03/12/2022 0533  ? AST 13 (L) 02/22/2022 0853  ? ALT 10 03/12/2022 0533  ? ALT 7 02/22/2022 0853  ? ALKPHOS 117 03/12/2022 0533  ? BILITOT 0.6 03/12/2022 0533  ? BILITOT 0.4 02/22/2022 0853  ? GFRNONAA 39 (L) 03/13/2022 0419  ? GFRNONAA >60 02/22/2022 0853  ? GFRAA 93 11/03/2020 1103  ? ? ?glycosylated hemoglobin-6.0 ? ?Lipid Panel  ?   ?Component Value Date/Time  ? CHOL 182 03/11/2022 0333  ? TRIG 136 03/11/2022 0333  ? TRIG 134 03/11/2022 0333  ? HDL 57 03/11/2022 0333  ? CHOLHDL 3.2 03/11/2022 0333  ? VLDL 27 03/11/2022 0333  ? LDSaticoy8 03/11/2022 0333  ? LDLDIRECT 86.1 03/06/2012 0841  ?LDL 98 ? ?2D echo ?LVEF 60 to 65%, no regional wall motion abnormalities of the left ventricle.  There is mild left ventricular hypertrophy.  Right ventricle systolic function moderately reduced.  Pericardial effusion in  anterior to the right ventricle and localized near the right atrium.  No evidence of cardiac tamponade.  Large pleural effusion in the left lateral region.  Mitral valve normal.  Aortic valve tricuspid with no evidence of regurgitation visualized.  Left atrial size is normal.  No atrial level shunt detected by color-flow Doppler. ? ? 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 ?CT angiography of the head and neck with no emergent large vessel occlusion or hemodynamically significant stenosis of the intracranial or  cervical vasculature.  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  Medium size pleural effusions with innumerable bilateral pulmonary nodules-too many to count-most consistent with metastatic disease. ? ?CT angio chest: report reviewed. ?IMPRESSION: ?-No pulmonary embolism. ?-Enlarging bilateral pleural effusions, interval development of ?diffuse ground-glass pulmonary infiltrate most suggestive of ?alveolar pulmonary edema, and development of diffuse body wall and ?retroperitoneal edema in keeping with anasarca. Together, these ?findings may relate to changes of progressive cardiogenic failure ?and echocardiography may be more helpful for further evaluation. ?-Moderate multi-vessel coronary artery calcification. Mild ?cardiomegaly with left ventricular hypertrophy. ?-Grossly stable diffuse subcentimeter pulmonary nodularity, likely ?benign given its stability over time. See differential ?considerations above. ?-Interval development of a subacute manubrial fracture. 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 ?-Support tubes in appropriate position ? ?Assessment:  ?76 year old history of multiple myeloma  hypertension hyperlipidemia admitted for respiratory distress and intubated for hypoxic respiratory failure in the ED-neurology consultation/code stroke for staring spell which was presumed to be due to encephalopathy versus stro

## 2022-03-13 NOTE — Progress Notes (Signed)
*  PRELIMINARY RESULTS* ?Echocardiogram ?Echocardiogram Transesophageal has been performed. ? ?Wendy [name], [name] ?03/13/2022, 1:09 PM ?

## 2022-03-13 NOTE — Consult Note (Signed)
PHARMACY CONSULT NOTE ? ?Pharmacy Consult for Electrolyte Monitoring and Replacement  ? ?Recent Labs: ?Potassium (mmol/L)  ?Date Value  ?03/13/2022 4.9  ? ?Magnesium (mg/dL)  ?Date Value  ?03/13/2022 2.3  ? ?Calcium (mg/dL)  ?Date Value  ?03/13/2022 8.3 (L)  ? ?Albumin (g/dL)  ?Date Value  ?03/12/2022 2.8 (L)  ? ?Phosphorus (mg/dL)  ?Date Value  ?03/13/2022 3.6  ? ?Sodium (mmol/L)  ?Date Value  ?03/13/2022 134 (L)  ?11/03/2020 123 (L)  ? 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 Patient is currently intubated, sedated, and on mechanical ventilation in the ICU. Pharmacy consulted to assist with electrolyte monitoring and replacement as indicated. ? ?Goal of Therapy:  ?Electrolytes within normal limits ? ?Plan:  ?--Hyponatremia improving. Continue to monitor ?--Hyperkalemia resolved. Lab hemolyzed. Continues on Lokelma 10 g daily. Management per PCCM ?--Follow-up electrolytes with AM labs tomorrow ? ?[name] ?03/13/2022 7:50 AM  ?

## 2022-03-13 NOTE — Progress Notes (Signed)
OT Cancellation Note ? ?Patient Details ?Name: Wendy [name] ?MRN: [id number] ?DOB: Aug 13, 1946 ? ? ?Cancelled Treatment:    Reason Eval/Treat Not Completed: Medical issues which prohibited therapy.Patient remains sedated on the ventilator and is not a candidate for therapeutic intervention at this time per  Dr Mortimer Fries. OT will sign off for now. Please re-order when medically ready.  ? ?Darleen Crocker, MS, OTR/L , CBIS ?ascom [phone]  ?03/13/22, 9:59 AM  ?

## 2022-03-13 NOTE — Progress Notes (Signed)
? ?NAME:  Kilynn L. Thoma, MRN:  [id number], DOB:  30-Jun-1946, LOS: 2 ?ADMISSION DATE:  03/10/2022 ? ?SYNOPSIS ?76 yo F presenting to [facility] ED from home via EMS on 03/10/22 with complaints of respiratory distress. Per ED documentation, EMS found the patient dyspneic and hypoxic with SpO2 88% on RA, she was A&O x 4. She received duo-neb, solu medrol, Zofran & magnesium. 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 She reports since her mother switched to immunotherapy she had been doing better, no recent complaints of any kind. Of note, patient was on Eliquis for un-provoked bilateral DVT's until 02/22/22 when this medication was stopped. ? ? ?  ?ED course: ?Upon arrival to ED, as EMS was wheeling her into triage she appeared to have an abrupt change in mental status: staring off into space, nonverbal/unable to follow commands/unresponsive except for eyes being open and blinking to threat. CODE STROKE was initiated. When the patient returned from the [facility] wo contrast her respiratory status declined and her mentation had not improved, EDP made the decision to emergently intubate the patient placing her on mechanical ventilatory support. Tele-neurology assessed the patient and imaging deeming the patient NOT a candidate for TNKase and there was no LVO. ? ?PCCM consulted for admission due to acute respiratory failure in the setting of suspected acute ischemic stroke with emergent intubation requiring mechanical ventilatory support. In discussions bedside with the patient's daughter and EDP, the patient's DNR status was confirmed. Of note, the patient has become responsive, able to follow commands and nod somewhat appropriately. Amy, the patient's daughter would like to leave her mother on mechanical ventilatory support overnight. If she is unable to wean off mechanical ventilation in the morning, she would like to wait until a  follow up MRI can be performed before making the decision to extubate. ? ? ? ?Significant labs:  ?Hematology: WBC: 12.8, Hgb: 12.3,  ?Troponin: 46, BNP: 1562, PCT: pending, ?ABG: 7.37/ 41/ 65/ 23.7 ?CXR 03/10/22: Progressive peribronchial thickening suggestive of pulmonary edema. Bilateral pleural effusions ?CT head wo contrast 03/10/22: no acute intracranial abnormality. ASPECTS 10. ?CTa head/neck 03/11/22: No emergent large vessel occlusion or hemodynamically significant stenosis of the intracranial or cervical arteries. 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 Medium-sized pleural effusions with innumerable bilateral pulmonary nodules, too many to count.  ?CT angio chest 03/11/22: negative for PE. Enlarging bilateral pleural effusions, interval development of diffuse ground-glass pulmonary infiltrate most suggestive of alveolar pulmonary edema & development of diffuse body wall and retroperitoneal edema in keeping with anasarca. Moderate multi-vessel coronary artery calcification. Mild cardiomegaly with left ventricular hypertrophy. Grossly stable diffuse subcentimeter pulmonary nodularity. Interval development of a subacute manubrial fracture. Osteopenia. ?  ?MRI BRAIN +multiple acute ischemic infarcts frontal and parietal   ? ? ? ? ?Significant Hospital Events: ?Including procedures, antibiotic start and stop dates in addition to other pertinent events   ?2/59 acute metabolic encephalopathy and severe resp distress ?Admit to ICU with suspected TIA/ischemic CVA and acute hypoxic respiratory failure in the setting of pulmonary edema on mechanical ventilatory support ?3/20 remains critically ill, on vent, EEG completed, TEE pending ? ? ? ? ? ?Micro Data:  ?COVID-19 & Influenza A/B: negative ? ?Antimicrobials:  ? ?Antibiotics Given (last 72 hours)   ? ? None  ? ?  ? ? ? ? ? ? ? ?Interim History / Subjective:  ?Remains intubated,sedated ?On pressors ?Remains critically ill ? ?EEG-This study is  suggestive of moderate  diffuse encephalopathy, nonspecific etiology. No seizures or definite epileptiform discharges were seen throughout the recording. ? ? ?CBC ?   ?Component Value Date/Time  ? WBC 10.3 03/13/2022 0419  ? RBC 3.91 03/13/2022 0419  ? HGB 11.7 (L) 03/13/2022 0419  ? HGB 11.0 (L) 02/22/2022 0853  ? HCT 35.7 (L) 03/13/2022 0419  ? PLT 344 03/13/2022 0419  ? PLT 445 (H) 02/22/2022 0853  ? MCV 91.3 03/13/2022 0419  ? MCH 29.9 03/13/2022 0419  ? MCHC 32.8 03/13/2022 0419  ? RDW 14.6 03/13/2022 0419  ? LYMPHSABS 0.8 03/13/2022 0419  ? MONOABS 0.5 03/13/2022 0419  ? EOSABS 0.0 03/13/2022 0419  ? BASOSABS 0.0 03/13/2022 0419  ? ?BMP Latest Ref Rng & Units 03/13/2022 03/12/2022 03/11/2022  ?Glucose 70 - 99 mg/dL 111(H) 93 152(H)  ?BUN 8 - 23 mg/dL 30(H) 29(H) 20  ?Creatinine 0.44 - 1.00 mg/dL 1.39(H) 1.32(H) 0.90  ?BUN/Creat Ratio 12 - 28 - - -  ?Sodium 135 - 145 mmol/L 134(L) 131(L) 132(L)  ?Potassium 3.5 - 5.1 mmol/L 4.9 5.3(H) 3.4(L)  ?Chloride 98 - 111 mmol/L 98 98 93(L)  ?CO2 22 - 32 mmol/L '27 25 25  ' ?Calcium 8.9 - 10.3 mg/dL 8.3(L) 8.0(L) 9.1  ? ? ? ? ? ? ? ?Objective   ?Blood pressure (!) 172/60, pulse 97, temperature 99.1 ?F (37.3 ?C), resp. rate 20, height '5\' 2"'  (1.575 m), weight 48.7 kg, SpO2 99 %. ?   ?Vent Mode: PRVC ?FiO2 (%):  [30 %] 30 % ?Set Rate:  [20 bmp] 20 bmp ?Vt Set:  [420 mL] 420 mL ?PEEP:  [5 cmH20] 5 cmH20 ?Plateau Pressure:  [18 cmH20-22 cmH20] 19 cmH20  ? ?Intake/Output Summary (Last 24 hours) at 03/13/2022 5093 ?Last data filed at 03/13/2022 0600 ?Gross per 24 hour  ?Intake 1804.13 ml  ?Output 980 ml  ?Net 824.13 ml  ? ? ?Filed Weights  ? 03/11/22 0428 03/12/22 0400 03/13/22 0500  ?Weight: 48.2 kg 47 kg 48.7 kg  ? ? ?REVIEW OF SYSTEMS ? ?PATIENT IS UNABLE TO PROVIDE COMPLETE REVIEW OF SYSTEMS DUE TO SEVERE CRITICAL ILLNESS AND TOXIC METABOLIC ENCEPHALOPATHY ? ? ? ?PHYSICAL EXAMINATION: ? ?GENERAL:critically ill appearing, +resp distress ?EYES: Pupils equal, round, reactive to light.  No scleral icterus.   ?MOUTH: Moist mucosal membrane. INTUBATED ?NECK: Supple.  ?PULMONARY: +rhonchi, +wheezing ?CARDIOVASCULAR: S1 and S2.  No murmurs  ?GASTROINTESTINAL: Soft, nontender, -distended. Positive bowel sounds.  ?MUSCULOSKELETAL: No swelling, clubbing, or edema.  ?NEUROLOGIC: obtunded ?SKIN:intact,warm,dry ? ? ?Labs/imaging that I havepersonally reviewed  ?(right click and "Reselect all SmartList Selections" daily)  ? ? ?ASSESSMENT AND PLAN ?SYNOPSIS ? ?76 yo WF admitted to ICU for acute hypoxic resp failure with acute CHF HFpEF exacerbation and inability to protect airway with acute CVA ? ? ?Severe ACUTE Hypoxic and Hypercapnic Respiratory Failure ?-continue Mechanical Ventilator support ?-Wean Fio2 and PEEP as tolerated ?-VAP/VENT bundle implementation ?- Wean PEEP & FiO2 as tolerated, maintain SpO2 > 88% ?- Head of bed elevated 30 degrees, VAP protocol in place ?- Plateau pressures less than 30 cm H20  ?- Intermittent chest x-ray & ABG PRN ?- Ensure adequate pulmonary hygiene  ?-will perform SAT/SBT when respiratory parameters are met ? ? ?Vent Mode: PRVC ?FiO2 (%):  [30 %] 30 % ?Set Rate:  [20 bmp] 20 bmp ?Vt Set:  [420 mL] 420 mL ?PEEP:  [5 cmH20] 5 cmH20 ?Plateau Pressure:  [18 cmH20-22 cmH20] 19 cmH20 ? ? ?CARDIAC FAILURE-HFpEF with pulmonary edema ?-oxygen  as needed ?-Lasix as tolerated ?-follow up cardiac enzymes as indicated ?-follow up cardiology recs ? ? ?CARDIAC ?ICU monitoring ? ?ACUTE KIDNEY INJURY/Renal Failure ?-continue Foley Catheter-assess need ?-Avoid nephrotoxic agents ?-Follow urine output, BMP ?-Ensure adequate renal perfusion, optimize oxygenation ?-Renal dose medications ? ? ?Intake/Output Summary (Last 24 hours) at 03/13/2022 0714 ?Last data filed at 03/13/2022 0600 ?Gross per 24 hour  ?Intake 1804.13 ml  ?Output 980 ml  ?Net 824.13 ml  ? ? ? ? ?NEUROLOGY ?Acute toxic metabolic encephalopathy due to Acute CVA based on MRI results ?need for sedation,Goal RASS 0 ?Not a candidate for TNKase ?CTH:  negative, ASPECTS 10, CTangio head/neck/perfusion: negative for LVO, incidental aneurysm.  ?- Neuro checks Q 2 x 24 hours ?- NIHSS Q shift ?- Neurology consulted, appreciate input ?-Lovenox for VTE prophylaxis ?EEG NE

## 2022-03-13 NOTE — Plan of Care (Signed)
? ?  Interdisciplinary Goals of Care Family Meeting ? ? ?Date carried out: 03/13/2022 ? ?Location of the meeting: Phone conference ? ?Member's involved: Physician and Family Member or next of kin ? ?Durable Power of Tour manager: Daughter Amy   ? ? ?Code status: Full DNR ? ?Disposition: Continue current acute care ? ? ?GOALS OF CARE DISCUSSION ? ?The Clinical status was relayed to family in detail- ? ?Updated and notified of patients medical condition- ?Patient remains unresponsive and will not open eyes to command.   ?Patient is having a weak cough and struggling to remove secretions.   ?Patient with increased WOB and using accessory muscles to breathe ?Explained to family course of therapy and the modalities  ? ?Patient with Progressive multiorgan failure with a very high probablity of a very minimal chance of meaningful recovery despite all aggressive and optimal medical therapy.  ? ?Family understands the situation. ? ? ?Family are satisfied with Plan of action and management. All questions answered ? ?Additional CC time 30 mins ? ? ?Corrin Parker, M.D.  ?[facility]  ?Medical Director [place] ?Medical Director [facility] Cardio-Pulmonary Department  ? ? ?
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
## 2022-03-13 NOTE — Progress Notes (Signed)
SLP Cancellation Note ? ?Patient Details ?Name: Wendy [name]. Wendy [name] ?MRN: [id number] ?DOB: Jun 09, 1946 ? ? ?Cancelled treatment:       Reason Eval/Treat Not Completed: Patient not medically ready;Medical issues which prohibited therapy (pt remains orally intubated; sedated currently.) ?ST services will f/u w/ assessment when pt is medically appropriate.  ? ? ? ? ? ?Orinda Kenner, MS, CCC-SLP ?Speech Language Pathologist ?Rehab Services; [place] ?[phone] (ascom) ?[name] ?03/13/2022, 9:31 AM ?

## 2022-03-14 ENCOUNTER — Inpatient Hospital Stay: Payer: Medicare Other

## 2022-03-14 DIAGNOSIS — I5021 Acute systolic (congestive) heart failure: Secondary | ICD-10-CM

## 2022-03-14 DIAGNOSIS — Z87891 Personal history of nicotine dependence: Secondary | ICD-10-CM

## 2022-03-14 DIAGNOSIS — D6859 Other primary thrombophilia: Secondary | ICD-10-CM

## 2022-03-14 DIAGNOSIS — E8581 Light chain (AL) amyloidosis: Secondary | ICD-10-CM | POA: Diagnosis not present

## 2022-03-14 DIAGNOSIS — R918 Other nonspecific abnormal finding of lung field: Secondary | ICD-10-CM

## 2022-03-14 DIAGNOSIS — J9601 Acute respiratory failure with hypoxia: Secondary | ICD-10-CM | POA: Diagnosis not present

## 2022-03-14 DIAGNOSIS — R4182 Altered mental status, unspecified: Secondary | ICD-10-CM | POA: Diagnosis not present

## 2022-03-14 DIAGNOSIS — J90 Pleural effusion, not elsewhere classified: Secondary | ICD-10-CM

## 2022-03-14 DIAGNOSIS — J810 Acute pulmonary edema: Secondary | ICD-10-CM

## 2022-03-14 DIAGNOSIS — E43 Unspecified severe protein-calorie malnutrition: Secondary | ICD-10-CM | POA: Diagnosis not present

## 2022-03-14 DIAGNOSIS — Z7901 Long term (current) use of anticoagulants: Secondary | ICD-10-CM

## 2022-03-14 DIAGNOSIS — I639 Cerebral infarction, unspecified: Secondary | ICD-10-CM | POA: Diagnosis not present

## 2022-03-14 LAB — GLUCOSE, CAPILLARY
Glucose-Capillary: 104 mg/dL — ABNORMAL HIGH (ref 70–99)
Glucose-Capillary: 105 mg/dL — ABNORMAL HIGH (ref 70–99)
Glucose-Capillary: 71 mg/dL (ref 70–99)
Glucose-Capillary: 81 mg/dL (ref 70–99)
Glucose-Capillary: 83 mg/dL (ref 70–99)
Glucose-Capillary: 87 mg/dL (ref 70–99)
Glucose-Capillary: 89 mg/dL (ref 70–99)

## 2022-03-14 LAB — TSH: TSH: 8.303 u[IU]/mL — ABNORMAL HIGH (ref 0.350–4.500)

## 2022-03-14 LAB — VITAMIN B12: Vitamin B-12: 262 pg/mL (ref 180–914)

## 2022-03-14 LAB — BASIC METABOLIC PANEL
Anion gap: 5 (ref 5–15)
BUN: 31 mg/dL — ABNORMAL HIGH (ref 8–23)
CO2: 28 mmol/L (ref 22–32)
Calcium: 7.9 mg/dL — ABNORMAL LOW (ref 8.9–10.3)
Chloride: 99 mmol/L (ref 98–111)
Creatinine, Ser: 1.05 mg/dL — ABNORMAL HIGH (ref 0.44–1.00)
GFR, Estimated: 55 mL/min — ABNORMAL LOW (ref >60)
Glucose, Bld: 107 mg/dL — ABNORMAL HIGH (ref 70–99)
Potassium: 4.3 mmol/L (ref 3.5–5.1)
Sodium: 132 mmol/L — ABNORMAL LOW (ref 135–145)

## 2022-03-14 LAB — TRIGLYCERIDES: Triglycerides: 131 mg/dL (ref <150)

## 2022-03-14 LAB — T4, FREE: Free T4: 1.28 ng/dL — ABNORMAL HIGH (ref 0.61–1.12)

## 2022-03-14 LAB — AMMONIA: Ammonia: 15 umol/L (ref 9–35)

## 2022-03-14 LAB — VITAMIN D 25 HYDROXY (VIT D DEFICIENCY, FRACTURES): Vit D, 25-Hydroxy: 66.36 ng/mL (ref 30–100)

## 2022-03-14 IMAGING — DX DG CHEST 1V PORT
1 series · 1 of 1 positions shown · non-contrast
Comparison: [DATE] and chest CT [DATE]

CLINICAL DATA: Acute respiratory failure.

EXAM:
PORTABLE CHEST 1 VIEW

[chest ap]
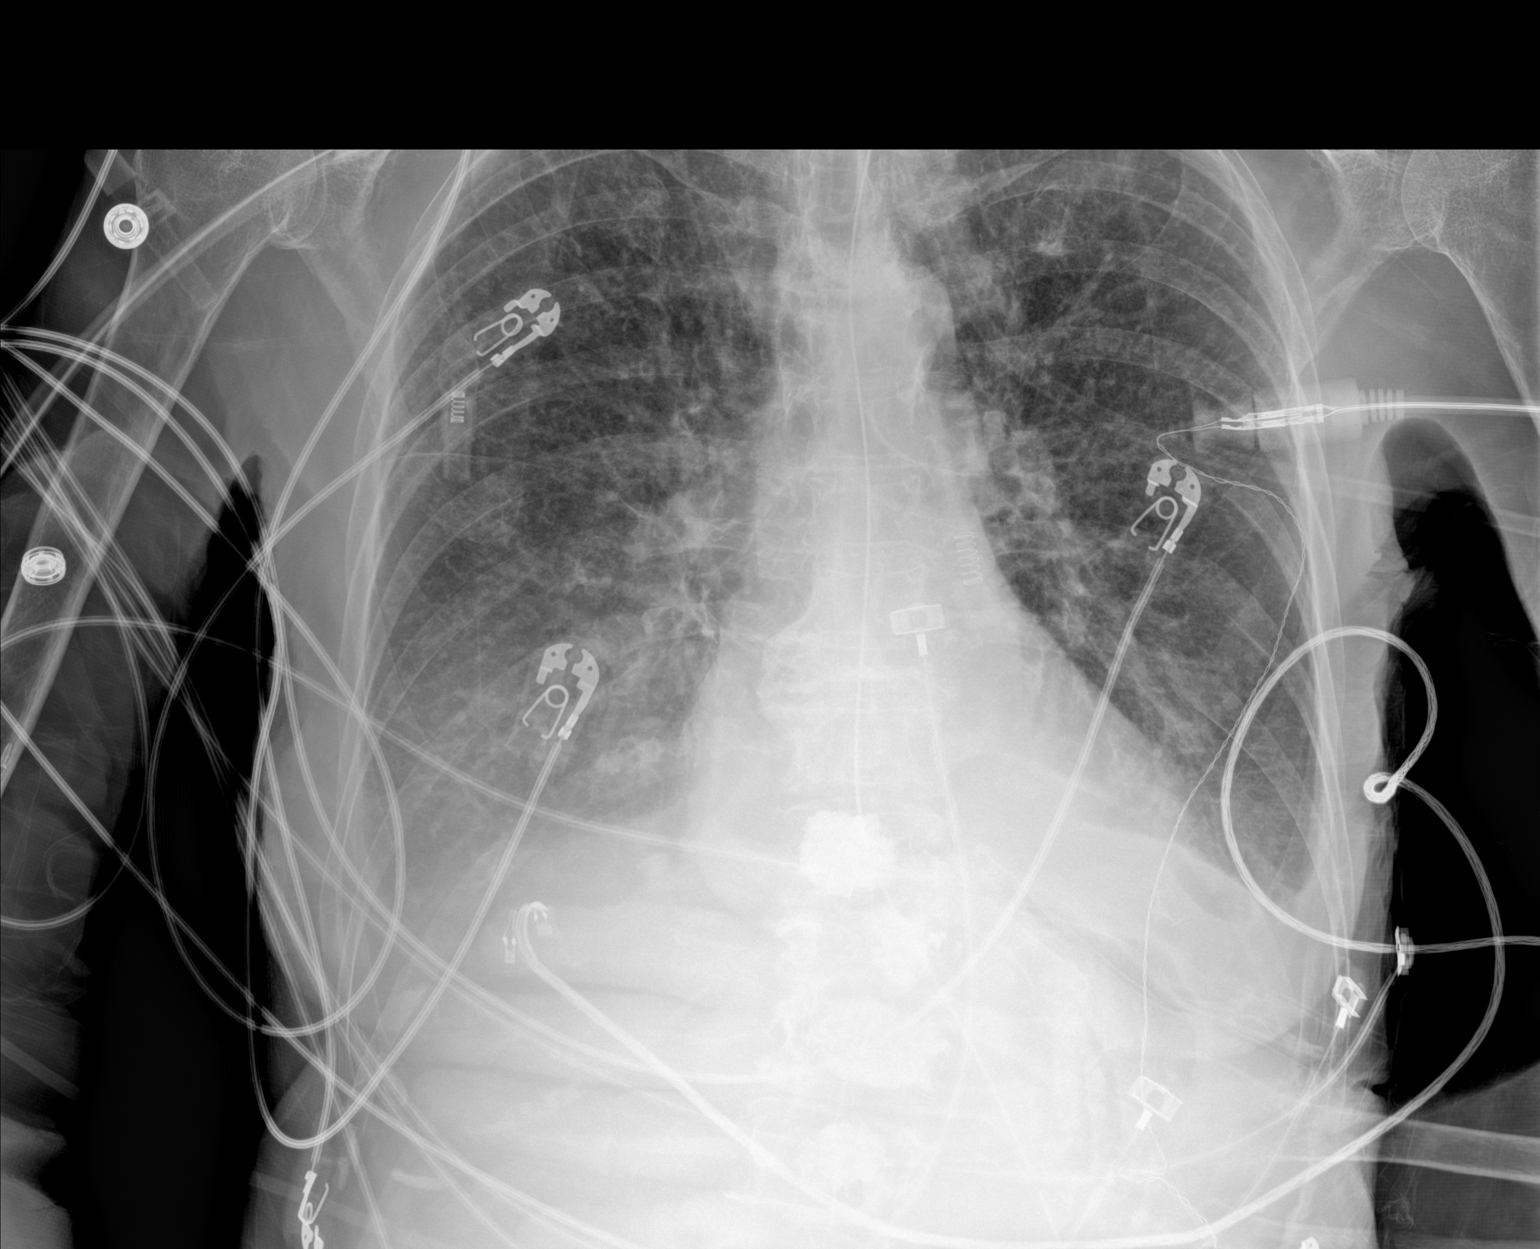

[1 of 1 positions shown; findings below may reference images not displayed]

FINDINGS: Endotracheal tube is 6.1 cm above the carina. Evidence for bilateral
pleural effusions with hazy densities in the right lower chest.
Prominent interstitial lung markings bilaterally. Lung findings have
minimally changed. Heart size is normal. Nasogastric tube extends
into the left upper abdomen. Multiple levels of vertebral body
augmentation in the lower thoracic and upper lumbar spine.
IMPRESSION: 1. Persistent bilateral pleural effusions with hazy densities in the
right lower chest. Findings have minimally changed since [DATE].
[DATE]. Prominent interstitial lung markings have minimally changed.
3.  Endotracheal tube is appropriately positioned.

## 2022-03-14 MED ORDER — THIAMINE HCL 100 MG/ML IJ SOLN: 100.0000 mg | Freq: Every day | INTRAMUSCULAR | Status: DC

## 2022-03-14 MED ORDER — THIAMINE HCL 100 MG/ML IJ SOLN
250.0000 mg | Freq: Every day | INTRAVENOUS | Status: AC
  Administered 2022-03-18 – 2022-03-19 (×2): 250 mg via INTRAVENOUS
  Filled 2022-03-14 (×2): qty 2.5

## 2022-03-14 MED ORDER — ASPIRIN 81 MG PO CHEW
81.0000 mg | CHEWABLE_TABLET | Freq: Every day | ORAL | Status: DC
  Administered 2022-03-14: 81 mg
  Filled 2022-03-14: qty 1

## 2022-03-14 MED ORDER — THIAMINE HCL 100 MG/ML IJ SOLN
500.0000 mg | Freq: Three times a day (TID) | INTRAVENOUS | Status: AC
  Administered 2022-03-14 – 2022-03-17 (×9): 500 mg via INTRAVENOUS
  Filled 2022-03-14 (×9): qty 5

## 2022-03-14 NOTE — Progress Notes (Signed)
? ?NAME:  Wendy [name], MRN:  [id number], DOB:  May 15, 1946, LOS: 3 ?ADMISSION DATE:  03/10/2022 ? ?SYNOPSIS ?76 yo F presenting to [facility] ED from home via EMS on 03/10/22 with complaints of respiratory distress. Per ED documentation, EMS found the patient dyspneic and hypoxic with SpO2 88% on RA, she was A&O x 4. She received duo-neb, solu medrol, Zofran & magnesium. 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 She reports since her mother switched to immunotherapy she had been doing better, no recent complaints of any kind. Of note, patient was on Eliquis for un-provoked bilateral DVT's until 02/22/22 when this medication was stopped. ? ? ?  ?ED course: ?Upon arrival to ED, as EMS was wheeling her into triage she appeared to have an abrupt change in mental status: staring off into space, nonverbal/unable to follow commands/unresponsive except for eyes being open and blinking to threat. CODE STROKE was initiated. When the patient returned from the [facility] wo contrast her respiratory status declined and her mentation had not improved, EDP made the decision to emergently intubate the patient placing her on mechanical ventilatory support. Tele-neurology assessed the patient and imaging deeming the patient NOT a candidate for TNKase and there was no LVO. ? ?PCCM consulted for admission due to acute respiratory failure in the setting of suspected acute ischemic stroke with emergent intubation requiring mechanical ventilatory support. In discussions bedside with the patient's daughter and EDP, the patient's DNR status was confirmed. Of note, the patient has become responsive, able to follow commands and nod somewhat appropriately. Amy, the patient's daughter would like to leave her mother on mechanical ventilatory support overnight. If she is unable to wean off mechanical ventilation in the morning, she would like to wait until a  follow up MRI can be performed before making the decision to extubate. ? ? ? ?Significant labs:  ?Hematology: WBC: 12.8, Hgb: 12.3,  ?Troponin: 46, BNP: 1562, PCT: pending, ?ABG: 7.37/ 41/ 65/ 23.7 ?CXR 03/10/22: Progressive peribronchial thickening suggestive of pulmonary edema. Bilateral pleural effusions ?CT head wo contrast 03/10/22: no acute intracranial abnormality. ASPECTS 10. ?CTa head/neck 03/11/22: No emergent large vessel occlusion or hemodynamically significant stenosis of the intracranial or cervical arteries. 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 Medium-sized pleural effusions with innumerable bilateral pulmonary nodules, too many to count.  ?CT angio chest 03/11/22: negative for PE. Enlarging bilateral pleural effusions, interval development of diffuse ground-glass pulmonary infiltrate most suggestive of alveolar pulmonary edema & development of diffuse body wall and retroperitoneal edema in keeping with anasarca. Moderate multi-vessel coronary artery calcification. Mild cardiomegaly with left ventricular hypertrophy. Grossly stable diffuse subcentimeter pulmonary nodularity. Interval development of a subacute manubrial fracture. Osteopenia. ?  ?MRI BRAIN +multiple acute ischemic infarcts frontal and parietal   ? ? ? ? ?Significant Hospital Events: ?Including procedures, antibiotic start and stop dates in addition to other pertinent events   ?0/86 acute metabolic encephalopathy and severe resp distress ?Admit to ICU with suspected TIA/ischemic CVA and acute hypoxic respiratory failure in the setting of pulmonary edema on mechanical ventilatory support ?3/20 remains critically ill, on vent, EEG completed,  ?3/20 TEE shows severe plaque disease in arch or aorta ?3/21 remains intubated,sedated ? ? ? ? ? ?Micro Data:  ?COVID-19 & Influenza A/B: negative ? ?Antimicrobials:  ? ?Antibiotics Given (last 72 hours)   ? ? None  ? ?  ? ? ? ? ? ? ? ?Interim History / Subjective:  ?  Remains critically ill ?On  pressors ?EEG reviewed by neurology ?Keppra adjusted ?Remains encephalopathic ? ?Vent Mode: PRVC ?FiO2 (%):  [24 %-30 %] 25 % ?Set Rate:  [20 bmp] 20 bmp ?Vt Set:  [420 mL] 420 mL ?PEEP:  [5 cmH20] 5 cmH20 ?Plateau Pressure:  [25 cmH20] 25 cmH20 ? ? ? ?CBC ?   ?Component Value Date/Time  ? WBC 10.3 03/13/2022 0419  ? RBC 3.91 03/13/2022 0419  ? HGB 11.7 (L) 03/13/2022 0419  ? HGB 11.0 (L) 02/22/2022 0853  ? HCT 35.7 (L) 03/13/2022 0419  ? PLT 344 03/13/2022 0419  ? PLT 445 (H) 02/22/2022 0853  ? MCV 91.3 03/13/2022 0419  ? MCH 29.9 03/13/2022 0419  ? MCHC 32.8 03/13/2022 0419  ? RDW 14.6 03/13/2022 0419  ? LYMPHSABS 0.8 03/13/2022 0419  ? MONOABS 0.5 03/13/2022 0419  ? EOSABS 0.0 03/13/2022 0419  ? BASOSABS 0.0 03/13/2022 0419  ? ?BMP Latest Ref Rng & Units 03/14/2022 03/13/2022 03/12/2022  ?Glucose 70 - 99 mg/dL 107(H) 111(H) 93  ?BUN 8 - 23 mg/dL 31(H) 30(H) 29(H)  ?Creatinine 0.44 - 1.00 mg/dL 1.05(H) 1.39(H) 1.32(H)  ?BUN/Creat Ratio 12 - 28 - - -  ?Sodium 135 - 145 mmol/L 132(L) 134(L) 131(L)  ?Potassium 3.5 - 5.1 mmol/L 4.3 4.9 5.3(H)  ?Chloride 98 - 111 mmol/L 99 98 98  ?CO2 22 - 32 mmol/L _0 ?Calcium 8.9 - 10.3 mg/dL 7.9(L) 8.3(L) 8.0(L)  ? ? ? ? ? ? ? ?Objective   ?Blood pressure (!) 106/55, pulse 63, temperature 99.9 ?F (37.7 ?C), resp. rate 20, height _1  (1.575 m), weight 47.2 kg, SpO2 99 %. ?   ?Vent Mode: PRVC ?FiO2 (%):  [24 %-30 %] 25 % ?Set Rate:  [20 bmp] 20 bmp ?Vt Set:  [420 mL] 420 mL ?PEEP:  [5 cmH20] 5 cmH20 ?Plateau Pressure:  [25 cmH20] 25 cmH20  ? ?Intake/Output Summary (Last 24 hours) at 03/14/2022 0716 ?Last data filed at 03/14/2022 0600 ?Gross per 24 hour  ?Intake 3822.46 ml  ?Output 1365 ml  ?Net 2457.46 ml  ? ? ?Filed Weights  ? 03/12/22 0400 03/13/22 0500 03/14/22 0500  ?Weight: 47 kg 48.7 kg 47.2 kg  ? ? ?REVIEW OF SYSTEMS ? ?PATIENT IS UNABLE TO PROVIDE COMPLETE REVIEW OF SYSTEMS DUE TO SEVERE CRITICAL ILLNESS AND TOXIC METABOLIC ENCEPHALOPATHY ? ? ? ?PHYSICAL  EXAMINATION: ? ?GENERAL:critically ill appearing, +resp distress ?EYES: Pupils equal, round, reactive to light.  No scleral icterus.  ?MOUTH: Moist mucosal membrane. INTUBATED ?NECK: Supple.  ?PULMONARY: +rhonchi, +wheezing ?CARDIOVASCULAR: S1 and S2.  No murmurs  ?GASTROINTESTINAL: Soft, nontender, -distended. Positive bowel sounds.  ?MUSCULOSKELETAL: No swelling, clubbing, or edema.  ?NEUROLOGIC: obtunded ?SKIN:intact,warm,dry ? ? ? ?Labs/imaging that I havepersonally reviewed  ?(right click and "Reselect all SmartList Selections" daily)  ? ? ?ASSESSMENT AND PLAN ?SYNOPSIS ? ?76 yo WF admitted to ICU for acute hypoxic resp failure with acute CHF HFpEF exacerbation and inability to protect airway with acute CVA, remains encephalopathic ? ?Severe ACUTE Hypoxic and Hypercapnic Respiratory Failure ?-continue Mechanical Ventilator support ?-Wean Fio2 and PEEP as tolerated ?-VAP/VENT bundle implementation ?- Wean PEEP & FiO2 as tolerated, maintain SpO2 > 88% ?- Head of bed elevated 30 degrees, VAP protocol in place ?- Plateau pressures less than 30 cm H20  ?- Intermittent chest x-ray & ABG PRN ?- Ensure adequate pulmonary hygiene  ?-will perform SAT/SBT when respiratory parameters are met ? ? ?Vent Mode: PRVC ?FiO2 (%):  [24 %-30 %]  25 % ?Set Rate:  [20 bmp] 20 bmp ?Vt Set:  [420 mL] 420 mL ?PEEP:  [5 cmH20] 5 cmH20 ?Plateau Pressure:  [25 cmH20] 25 cmH20 ? ?CARDIAC FAILURE- HFpEF with pulmonary edema ?-oxygen as needed ?-Lasix as tolerated ?-follow up cardiac enzymes as indicated ? ?CARDIAC ?ICU monitoring ? ? ?ACUTE KIDNEY INJURY/Renal Failure ?-continue Foley Catheter-assess need ?-Avoid nephrotoxic agents ?-Follow urine output, BMP ?-Ensure adequate renal perfusion, optimize oxygenation ?-Renal dose medications ? ? ?Intake/Output Summary (Last 24 hours) at 03/14/2022 0719 ?Last data filed at 03/14/2022 0600 ?Gross per 24 hour  ?Intake 3822.46 ml  ?Output 1365 ml  ?Net 2457.46 ml  ? ? ? ?NEUROLOGY ?Acute toxic  metabolic encephalopathy due to Acute CVA based on MRI results ?need for sedation,Goal RASS 0 ?Was Not a candidate for TNKase ?CTH: negative, ASPECTS 10, CTangio head/neck/perfusion: negative for LVO, incidental aneurysm.  ?- Neuro checks

## 2022-03-14 NOTE — Consult Note (Signed)
PHARMACY CONSULT NOTE ? ?Pharmacy Consult for Electrolyte Monitoring and Replacement  ? ?Recent Labs: ?Potassium (mmol/L)  ?Date Value  ?03/14/2022 4.3  ? ?Magnesium (mg/dL)  ?Date Value  ?03/13/2022 2.3  ? ?Calcium (mg/dL)  ?Date Value  ?03/14/2022 7.9 (L)  ? ?Albumin (g/dL)  ?Date Value  ?03/12/2022 2.8 (L)  ? ?Phosphorus (mg/dL)  ?Date Value  ?03/13/2022 3.6  ? ?Sodium (mmol/L)  ?Date Value  ?03/14/2022 132 (L)  ?11/03/2020 123 (L)  ? 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 Patient is currently intubated, sedated, and on mechanical ventilation in the ICU. Pharmacy consulted to assist with electrolyte monitoring and replacement as indicated. ? ?Nutrition: Tube feeds ? ?Goal of Therapy:  ?Electrolytes within normal limits ? ?Plan:  ?--Stable hyponatremia. Continue to monitor  ?--AKI resolving ?--Follow-up electrolytes with AM labs tomorrow ? ?[name] ?03/14/2022 7:42 AM  ?

## 2022-03-14 NOTE — Plan of Care (Signed)
? ?  Interdisciplinary Goals of Care Family Meeting ? ? ?Date carried out: 03/14/2022 ? ?Location of the meeting: Bedside ? ?Member's involved: Physician, Bedside Registered Nurse, and Family Member or next of kin ? ?Durable Power of Tour manager: Daughter AMy ? ? ? ?Code status: Full DNR ? ?Disposition: Continue current acute care ? ? ? ? ?GOALS OF CARE DISCUSSION ? ?The Clinical status was relayed to family in detail- ? ?Updated and notified of patients medical condition- ?Explained to family course of therapy and the modalities  ? ?Patient with Progressive multiorgan failure with a very high probablity of a very minimal chance of meaningful recovery despite all aggressive and optimal medical therapy.  ?Plan for SAT/SBT ?Plan for repeat EEG ?Plan for one way extubation if possible ?Patient remains DNR ? ? ?Family understands the situation. ? ? ?Family are satisfied with Plan of action and management. All questions answered ? ?Additional CC time 35 mins ? ? ?Corrin Parker, M.D.  ?[facility]  ?Medical Director [place] ?Medical Director [facility] Cardio-Pulmonary Department  ? ? ?

## 2022-03-14 NOTE — Consult Note (Addendum)
WOC Nurse Consult Note: ?Reason for Consult: Consult requested for bilat arms. According to the EMR, Pt has Amyloidosis, which is a complex immunosuppressive skin condition.  Pt is critically ill with multiple systemic factors which can impair healing. Her skin is very thin and fragile with multiple dark red purple bruises and scattered areas of red moist partial thickness skin tears to bilat upper and lower arms. Left anterior arm with a laceration and steristrips have been previously placed over the affected area. ?Coccyx, near rectum, is black and appearance is consistent with a deep tissue pressure injury; .5X.5cm ?Inner right labia/vagina fold is also has a deep tissue pressure injury; .3X.3cm  ?Pressure Injury POA: No ?Chin with red rash and dry crusted partial thickness skin loss; best practice recommends steroid cream for Amyloidosis.  Discussed plan of care with critical care team; they will order if desired.  ?Dressing procedure/placement/frequency: Topical treatment orders provided for bedside nurses to promote healing: Apply double-folded xerform gauze to bilat arms Q day, then cover with kerlex. Foam dressing to sacrum, change Q 3 days or PRN soiling. ?Please re-consult if further assistance is needed.  Thank-you,  ?Julien Girt MSN, RN, [place], [place], CNS ?[phone]  ? ?  ?

## 2022-03-14 NOTE — Progress Notes (Signed)
Eeg done 

## 2022-03-14 NOTE — Procedures (Addendum)
Patient Name: Wendy [name]. Kinne  ?MRN: [id number]  ?Epilepsy Attending: Lora Havens  ?Referring Physician/Provider: Dr Amie Portland ?Date: 03/14/2022 ?Duration: 27.28 mins ?  ?Patient history: 76 year old female with altered mental status.  EEG to evaluate for seizure. ?  ?Level of alertness: lethargic ?  ?AEDs during EEG study: LEV, propofol ?  ?Technical aspects: This EEG study was done with scalp electrodes positioned according to the 10-20 International system of electrode placement. Electrical activity was acquired at a sampling rate of '500Hz'$  and reviewed with a high frequency filter of '70Hz'$  and a low frequency filter of '1Hz'$ . EEG data were recorded continuously and digitally stored.  ?  ?Description: EEG showed continuous generalized predominantly 6-'7Hz'$  theta slowing admixed with intermittent generalized 2 to 3 Hz delta slowing. Hyperventilation and photic stimulation were not performed.    ?  ?ABNORMALITY ?- Continuous slow, generalized ?  ?IMPRESSION: ?This study is suggestive of moderate diffuse encephalopathy, nonspecific etiology. No seizures or definite epileptiform discharges were seen throughout the recording. ? ?Lora Havens  ?

## 2022-03-14 NOTE — Progress Notes (Addendum)
Neurology Progress Note ? ? ?S:// ?Seen and examined off of propofol. ?Discussed with heme-onc-no indication for anticoagulation-was on anticoagulation for Eliquis due to a provoked DVT.  Most recent lower extremity Doppler negative for DVT. ?Heme-onc consultant recommended checking antiphospholipid antibody for concern for arterial thrombi but that is low on the differential. ?No indication from a heme-onc perspective for anticoagulation ? ? ?O:// ?Current vital signs: ?BP (!) 106/55   Pulse 63   Temp 99.9 ?F (37.7 ?C)   Resp 20   Ht '5\' 2"'  (1.575 m)   Wt 47.2 kg   SpO2 99%   BMI 19.03 kg/m?  ?Vital signs in last 24 hours: ?Temp:  [93.6 ?F (34.2 ?C)-99.9 ?F (37.7 ?C)] 99.9 ?F (37.7 ?C) (03/22 0645) ?Pulse Rate:  [35-108] 63 (03/22 0615) ?Resp:  [16-21] 20 (03/22 0645) ?BP: (68-187)/(36-96) 106/55 (03/22 0645) ?SpO2:  [97 %-100 %] 99 % (03/22 0600) ?FiO2 (%):  [24 %-25 %] 25 % (03/22 0600) ?Weight:  [47.2 kg] 47.2 kg (03/22 0500) ?General: Intubated, on sedation which was held for the exam ?HEENT: Normocephalic atraumatic ?CVS: Regular rhythm ?Respiratory: Vented ?Abdomen nondistended nontender ?Extremities warm well perfused ?Neurological exam ?She is awake and alert off of sedation. ?She is following commands-was able to blink her eyes twice to command, was able to give me thumbs up on both hands and wiggle toes to command. ?Verbal output cannot be assessed due to the tube. ?Cranial nerves: Her pupils are equal round reactive light, she blinks to threat from both sides, no ptosis or lid lag, extraocular eye movements seem restricted in all directions-she is able to look some to the right but not to the left-gaze is midline.  Face appears symmetric with the limitation of the endotracheal tube in place.  Shoulder shrug is intact. ?Motor examination: She has increased tone in the left upper extremity which she keeps her shoulder and elbow flexed.  The tone at the wrist is normal.  She is able to squeeze and let  go on command on the left hand.  Right upper extremity-has mild increased tone but she is able to move it up and down-not so much antigravity as it falls to the bed and a second or so.  She is able to give a thumbs up on that hand as well.  Both her legs are antigravity with mild increased tone but she is able to wiggle toes to command. ?Sensory exam: Intact to touch all over ?Coordination: Difficult to assess given the above motor and sensory findings ?DTRs: Difficult to assess-I suspect that she has more paratonia than hypertonia. ?Gait testing cannot be performed due to her intubated status ? ? ? ?Medications ? ?Current Facility-Administered Medications:  ?  0.9 %  sodium chloride infusion, 250 mL, Intravenous, Continuous, Teressa Lower, NP ?  0.9 %  sodium chloride infusion, , Intravenous, Continuous, Furth, Cadence H, PA-C, Last Rate: 20 mL/hr at 03/14/22 0600, Infusion Verify at 03/14/22 0600 ?  acetaminophen (TYLENOL) tablet 650 mg, 650 mg, Per Tube, Q6H PRN, Rust-Chester, Toribio Harbour L, NP ?  albuterol (PROVENTIL) (2.5 MG/3ML) 0.083% nebulizer solution 2.5 mg, 2.5 mg, Nebulization, Q4H PRN, Rust-Chester, Britton L, NP ?  atorvastatin (LIPITOR) tablet 40 mg, 40 mg, Per Tube, Daily, [name], RPH, 40 mg at 03/13/22 1439 ?  chlorhexidine gluconate (MEDLINE KIT) (PERIDEX) 0.12 % solution 15 mL, 15 mL, Mouth Rinse, BID, Tyler Pita, MD, 15 mL at 03/13/22 1946 ?  Chlorhexidine Gluconate Cloth 2 % PADS 6 each,  6 each, Topical, Daily, Tyler Pita, MD, 6 each at 03/14/22 [phone]  clopidogrel (PLAVIX) tablet 75 mg, 75 mg, Per Tube, Daily, Derek Jack, MD, 75 mg at 03/13/22 1440 ?  docusate (COLACE) 50 MG/5ML liquid 100 mg, 100 mg, Per Tube, BID, Rust-Chester, Britton L, NP, 100 mg at 03/13/22 2211 ?  enoxaparin (LOVENOX) injection 30 mg, 30 mg, Subcutaneous, Q24H, [name], RPH, 30 mg at 03/13/22 1033 ?  feeding supplement (PROSource TF) liquid 45 mL, 45 mL, Per Tube, BID, Flora Lipps,  MD, 45 mL at 03/13/22 2211 ?  feeding supplement (VITAL 1.5 CAL) liquid 1,000 mL, 1,000 mL, Per Tube, Q24H, Kasa, Kurian, MD, Last Rate: 35 mL/hr at 03/14/22 0807, 1,000 mL at 03/14/22 0807 ?  fentaNYL (SUBLIMAZE) bolus via infusion 25-100 mcg, 25-100 mcg, Intravenous, Q15 min PRN, Rust-Chester, Britton L, NP, 50 mcg at 03/11/22 1613 ?  fentaNYL 2548mg in NS 2544m(1034mml) infusion-PREMIX, 25-200 mcg/hr, Intravenous, Continuous, Rust-Chester, Britton L, NP, Last Rate: 15 mL/hr at 03/14/22 0806, 150 mcg/hr at 03/14/22 0806 ?  free water 30 mL, 30 mL, Per Tube, Q4H, Kasa, Kurian, MD, 30 mL at 03/14/22 0505 ?  insulin aspart (novoLOG) injection 0-15 Units, 0-15 Units, Subcutaneous, Q4H, SmiLucrezia StarchD, 2 Units at 03/11/22 0125 ?  levETIRAcetam (KEPPRA) IVPB 500 mg/100 mL premix, 500 mg, Intravenous, Q12H, AroAmie PortlandD, Stopped at 03/14/22 [phone] MEDLINE mouth rinse, 15 mL, Mouth Rinse, 10 times per day, GonTyler PitaD, 15 mL at 03/14/22 [id number] midazolam (VERSED) injection 1 mg, 1 mg, Intravenous, Q15 min PRN, Rust-Chester, BriToribio Harbour NP ?  midazolam (VERSED) injection 1 mg, 1 mg, Intravenous, Q2H PRN, Rust-Chester, Britton L, NP ?  midodrine (PROAMATINE) tablet 5 mg, 5 mg, Per Tube, TID WC, Kasa, Kurian, MD, 5 mg at 03/13/22 1721 ?  multivitamin liquid 15 mL, 15 mL, Per Tube, Daily, Kasa, Kurian, MD, 15 mL at 03/13/22 1441 ?  norepinephrine (LEVOPHED) 4mg23m 250mL16m016 mg/mL) premix infusion, 2-10 mcg/min, Intravenous, Titrated, NelsoTeressa Lower Last Rate: 7.5 mL/hr at 03/14/22 0806, 2 mcg/min at 03/14/22 0806 ?  pantoprazole sodium (PROTONIX) 40 mg/20 mL oral suspension 40 mg, 40 mg, Per Tube, Daily, Rust-Chester, Britton L, NP, 40 mg at 03/13/22 1440 ?  polyethylene glycol (MIRALAX / GLYCOLAX) packet 17 g, 17 g, Per Tube, Daily, Rust-Chester, Britton L, NP, 17 g at 03/13/22 1440 ?  propofol (DIPRIVAN) 1000 MG/100ML infusion, 5-80 mcg/kg/min, Intravenous, Titrated, Kasa, Kurian, MD, Last  Rate: 1.46 mL/hr at 03/14/22 0805, 5 mcg/kg/min at 03/14/22 0805 ?  vecuronium (NORCURON) injection 10 mg, 10 mg, Intravenous, Q1H PRN, Kasa,Flora Lipps 10 mg at 03/13/22 1254 ? ?Labs ?CBC ?   ?Component Value Date/Time  ? WBC 10.3 03/13/2022 0419  ? RBC 3.91 03/13/2022 0419  ? HGB 11.7 (L) 03/13/2022 0419  ? HGB 11.0 (L) 02/22/2022 0853  ? HCT 35.7 (L) 03/13/2022 0419  ? PLT 344 03/13/2022 0419  ? PLT 445 (H) 02/22/2022 0853  ? MCV 91.3 03/13/2022 0419  ? MCH 29.9 03/13/2022 0419  ? MCHC 32.8 03/13/2022 0419  ? RDW 14.6 03/13/2022 0419  ? LYMPHSABS 0.8 03/13/2022 0419  ? MONOABS 0.5 03/13/2022 0419  ? EOSABS 0.0 03/13/2022 0419  ? BASOSABS 0.0 03/13/2022 0419  ? ?CMP  ?   ?Component Value Date/Time  ? NA 132 (L) 03/14/2022 0334  ? NA 123 (L) 11/03/2020 1103  ? K 4.3 03/14/2022 0334  ?  CL 99 03/14/2022 0334  ? CO2 28 03/14/2022 0334  ? GLUCOSE 107 (H) 03/14/2022 0334  ? BUN 31 (H) 03/14/2022 0334  ? BUN 10 11/03/2020 1103  ? CREATININE 1.05 (H) 03/14/2022 0334  ? CREATININE 0.85 02/22/2022 0853  ? CALCIUM 7.9 (L) 03/14/2022 0334  ? PROT 5.2 (L) 03/12/2022 0533  ? ALBUMIN 2.8 (L) 03/12/2022 0533  ? AST 15 03/12/2022 0533  ? AST 13 (L) 02/22/2022 0853  ? ALT 10 03/12/2022 0533  ? ALT 7 02/22/2022 0853  ? ALKPHOS 117 03/12/2022 0533  ? BILITOT 0.6 03/12/2022 0533  ? BILITOT 0.4 02/22/2022 0853  ? GFRNONAA 55 (L) 03/14/2022 0334  ? GFRNONAA >60 02/22/2022 0853  ? GFRAA 93 11/03/2020 1103  ? ? ?glycosylated hemoglobin-6.0 ? ?Lipid Panel  ?   ?Component Value Date/Time  ? CHOL 182 03/11/2022 0333  ? TRIG 131 03/14/2022 0334  ? HDL 57 03/11/2022 0333  ? CHOLHDL 3.2 03/11/2022 0333  ? VLDL 27 03/11/2022 0333  ? [place] 98 03/11/2022 0333  ? LDLDIRECT 86.1 03/06/2012 0841  ?LDL 98 ?TSH elevated and so is T4  ? ?2D echo ?LVEF 60 to 65%, no regional wall motion abnormalities of the left ventricle.  There is mild left ventricular hypertrophy.  Right ventricle systolic function moderately reduced.  Pericardial effusion in  anterior to the right ventricle and localized near the right atrium.  No evidence of cardiac tamponade.  Large pleural effusion in the left lateral region.  Mitral valve normal.  Aortic valve tricuspid with no ev

## 2022-03-15 DIAGNOSIS — I639 Cerebral infarction, unspecified: Secondary | ICD-10-CM | POA: Diagnosis not present

## 2022-03-15 DIAGNOSIS — I509 Heart failure, unspecified: Secondary | ICD-10-CM | POA: Diagnosis not present

## 2022-03-15 DIAGNOSIS — E43 Unspecified severe protein-calorie malnutrition: Secondary | ICD-10-CM | POA: Diagnosis not present

## 2022-03-15 DIAGNOSIS — J9601 Acute respiratory failure with hypoxia: Secondary | ICD-10-CM | POA: Diagnosis not present

## 2022-03-15 LAB — BASIC METABOLIC PANEL
Anion gap: 10 (ref 5–15)
BUN: 30 mg/dL — ABNORMAL HIGH (ref 8–23)
CO2: 22 mmol/L (ref 22–32)
Calcium: 8 mg/dL — ABNORMAL LOW (ref 8.9–10.3)
Chloride: 101 mmol/L (ref 98–111)
Creatinine, Ser: 0.99 mg/dL (ref 0.44–1.00)
GFR, Estimated: 59 mL/min — ABNORMAL LOW (ref >60)
Glucose, Bld: 115 mg/dL — ABNORMAL HIGH (ref 70–99)
Potassium: 4.5 mmol/L (ref 3.5–5.1)
Sodium: 133 mmol/L — ABNORMAL LOW (ref 135–145)

## 2022-03-15 LAB — LUPUS ANTICOAGULANT PANEL
DRVVT: 36.7 s (ref 0.0–47.0)
PTT Lupus Anticoagulant: 43.5 s (ref 0.0–43.5)

## 2022-03-15 LAB — GLUCOSE, CAPILLARY
Glucose-Capillary: 91 mg/dL (ref 70–99)
Glucose-Capillary: 128 mg/dL — ABNORMAL HIGH (ref 70–99)
Glucose-Capillary: 154 mg/dL — ABNORMAL HIGH (ref 70–99)
Glucose-Capillary: 124 mg/dL — ABNORMAL HIGH (ref 70–99)
Glucose-Capillary: 128 mg/dL — ABNORMAL HIGH (ref 70–99)
Glucose-Capillary: 107 mg/dL — ABNORMAL HIGH (ref 70–99)

## 2022-03-15 MED ORDER — ORAL CARE MOUTH RINSE
15.0000 mL | Freq: Two times a day (BID) | OROMUCOSAL | Status: DC
  Administered 2022-03-16 – 2022-03-19 (×6): 15 mL via OROMUCOSAL

## 2022-03-15 MED ORDER — PANTOPRAZOLE SODIUM 40 MG PO TBEC
40.0000 mg | DELAYED_RELEASE_TABLET | Freq: Every day | ORAL | Status: DC
  Administered 2022-03-16 – 2022-03-17 (×2): 40 mg via ORAL
  Filled 2022-03-15 (×2): qty 1

## 2022-03-15 MED ORDER — ASPIRIN 81 MG PO CHEW
81.0000 mg | CHEWABLE_TABLET | Freq: Every day | ORAL | Status: DC
Start: 2022-03-15 — End: 2022-03-19
  Administered 2022-03-16 – 2022-03-19 (×4): 81 mg via ORAL
  Filled 2022-03-15 (×4): qty 1

## 2022-03-15 MED ORDER — CLOPIDOGREL BISULFATE 75 MG PO TABS
75.0000 mg | ORAL_TABLET | Freq: Every day | ORAL | Status: DC
  Administered 2022-03-16 – 2022-03-19 (×4): 75 mg via ORAL
  Filled 2022-03-15 (×4): qty 1

## 2022-03-15 MED ORDER — DOCUSATE SODIUM 100 MG PO CAPS
100.0000 mg | ORAL_CAPSULE | Freq: Two times a day (BID) | ORAL | Status: DC
  Administered 2022-03-16 – 2022-03-19 (×7): 100 mg via ORAL
  Filled 2022-03-15 (×7): qty 1

## 2022-03-15 MED ORDER — INSULIN ASPART 100 UNIT/ML IJ SOLN: 0.0000 [IU] | Freq: Every day | INTRAMUSCULAR | Status: DC

## 2022-03-15 MED ORDER — CYANOCOBALAMIN 1000 MCG/ML IJ SOLN
1000.0000 ug | Freq: Once | INTRAMUSCULAR | Status: AC
  Administered 2022-03-15: 1000 ug via INTRAMUSCULAR
  Filled 2022-03-15: qty 1

## 2022-03-15 MED ORDER — METHYLPREDNISOLONE SODIUM SUCC 40 MG IJ SOLR
40.0000 mg | Freq: Once | INTRAMUSCULAR | Status: AC
  Administered 2022-03-15: 40 mg via INTRAVENOUS
  Filled 2022-03-15: qty 1

## 2022-03-15 MED ORDER — HYDROCORTISONE 1 % EX OINT
TOPICAL_OINTMENT | Freq: Every day | CUTANEOUS | Status: DC
  Administered 2022-03-15: 1 via TOPICAL
  Filled 2022-03-15 (×2): qty 28.35

## 2022-03-15 MED ORDER — INSULIN ASPART 100 UNIT/ML IJ SOLN
0.0000 [IU] | Freq: Three times a day (TID) | INTRAMUSCULAR | Status: DC
  Filled 2022-03-15: qty 1

## 2022-03-15 MED ORDER — VITAMIN B-12 1000 MCG PO TABS
1000.0000 ug | ORAL_TABLET | Freq: Every day | ORAL | Status: DC
  Administered 2022-03-16 – 2022-03-19 (×4): 1000 ug via ORAL
  Filled 2022-03-15 (×4): qty 1

## 2022-03-15 MED ORDER — ATORVASTATIN CALCIUM 20 MG PO TABS
40.0000 mg | ORAL_TABLET | Freq: Every day | ORAL | Status: DC
  Administered 2022-03-16 – 2022-03-19 (×4): 40 mg via ORAL
  Filled 2022-03-15 (×4): qty 2

## 2022-03-15 MED ORDER — POLYETHYLENE GLYCOL 3350 17 G PO PACK
17.0000 g | PACK | Freq: Every day | ORAL | Status: DC
  Administered 2022-03-16 – 2022-03-19 (×3): 17 g via ORAL
  Filled 2022-03-15 (×3): qty 1

## 2022-03-15 MED ORDER — ADULT MULTIVITAMIN W/MINERALS CH
1.0000 | ORAL_TABLET | Freq: Every day | ORAL | Status: DC
  Administered 2022-03-16 – 2022-03-19 (×4): 1 via ORAL
  Filled 2022-03-15 (×4): qty 1

## 2022-03-15 MED ORDER — FLUOCINONIDE 0.05 % EX OINT
TOPICAL_OINTMENT | Freq: Every day | CUTANEOUS | Status: DC
  Filled 2022-03-15: qty 15

## 2022-03-15 NOTE — Progress Notes (Signed)
Patient successfully extubated ?Will obtain Speech/PT/OT. ? ?Now SD status ?DNR/DNI ?Transfer care to [facility] ?

## 2022-03-15 NOTE — Progress Notes (Signed)
Neurology Progress Note ? ? ?S:// ?Seen and examined this morning ?Failed extubation trial yesterday but looking like she might be able to be extubated today. ? ?O:// ?Current vital signs: ?BP (!) 141/53 (BP Location: Right Leg)   Pulse 91   Temp 98.9 ?F (37.2 ?C) (Axillary)   Resp 20   Ht '5\' 2"'  (1.575 m)   Wt 46.5 kg   SpO2 98%   BMI 18.75 kg/m?  ?Vital signs in last 24 hours: ?Temp:  [97.9 ?F (36.6 ?C)-100 ?F (37.8 ?C)] 98.9 ?F (37.2 ?C) (03/23 0800) ?Pulse Rate:  [81-106] 91 (03/23 0600) ?Resp:  [16-21] 20 (03/23 0600) ?BP: (109-157)/(45-77) 141/53 (03/23 0600) ?SpO2:  [98 %-100 %] 98 % (03/23 0932) ?FiO2 (%):  [25 %] 25 % (03/23 0737) ?Weight:  [46.5 kg] 46.5 kg (03/23 0500) ?General: Intubated on no sedation, fairly awake ?HEENT: Normocephalic atraumatic with some dried blood around the mouth ?CVS: Regular rhythm ?Abdomen nondistended nontender ?Respiratory: Vented ?Extremities warm well perfused ?Neurologic exam ?Intubated ?Not sedated ?Awake ?Able to follow commands consistently ?Able to nod yes and no appropriately. ?Cranial nerves: Pupils equal round react light, extraocular movements are smooth today, blinks to threat from both sides, facial symmetry difficult to ascertain due to the tube. ?Motor examination: No drift in bilateral upper extremities with mild increased tone in the left upper extremity.  Right lower extremity is much weaker than the left and is unable to be lifted antigravity.  Left lower extremity has antigravity strength although she has a lot of generalized weakness and deconditioning as well. ?Sensation: Intact to touch ?Coordination difficult to assess but no gross incoordination ? ?Medications ? ?Current Facility-Administered Medications:  ?  0.9 %  sodium chloride infusion, 250 mL, Intravenous, Continuous, Teressa Lower, NP ?  0.9 %  sodium chloride infusion, , Intravenous, Continuous, Furth, Cadence H, PA-C, Last Rate: 10 mL/hr at 03/15/22 0600, Infusion Verify at 03/15/22  0600 ?  acetaminophen (TYLENOL) tablet 650 mg, 650 mg, Per Tube, Q6H PRN, Rust-Chester, Toribio Harbour L, NP ?  albuterol (PROVENTIL) (2.5 MG/3ML) 0.083% nebulizer solution 2.5 mg, 2.5 mg, Nebulization, Q4H PRN, Rust-Chester, Britton L, NP ?  aspirin chewable tablet 81 mg, 81 mg, Per Tube, Daily, Kasa, Kurian, MD, 81 mg at 03/14/22 1256 ?  atorvastatin (LIPITOR) tablet 40 mg, 40 mg, Per Tube, Daily, [name], RPH, 40 mg at 03/14/22 6301 ?  chlorhexidine gluconate (MEDLINE KIT) (PERIDEX) 0.12 % solution 15 mL, 15 mL, Mouth Rinse, BID, Tyler Pita, MD, 15 mL at 03/15/22 0757 ?  Chlorhexidine Gluconate Cloth 2 % PADS 6 each, 6 each, Topical, Daily, Tyler Pita, MD, 6 each at 03/15/22 0600 ?  clopidogrel (PLAVIX) tablet 75 mg, 75 mg, Per Tube, Daily, Derek Jack, MD, 75 mg at 03/14/22 6010 ?  docusate (COLACE) 50 MG/5ML liquid 100 mg, 100 mg, Per Tube, BID, Rust-Chester, Britton L, NP, 100 mg at 03/14/22 2232 ?  enoxaparin (LOVENOX) injection 30 mg, 30 mg, Subcutaneous, Q24H, [name], RPH, 30 mg at 03/14/22 9323 ?  feeding supplement (PROSource TF) liquid 45 mL, 45 mL, Per Tube, BID, Flora Lipps, MD, 45 mL at 03/14/22 2237 ?  feeding supplement (VITAL 1.5 CAL) liquid 1,000 mL, 1,000 mL, Per Tube, Q24H, Flora Lipps, MD, Stopped at 03/15/22 0800 ?  free water 30 mL, 30 mL, Per Tube, Q4H, Kasa, Kurian, MD, 30 mL at 03/15/22 0500 ?  levETIRAcetam (KEPPRA) IVPB 500 mg/100 mL premix, 500 mg, Intravenous, Q12H, Amie Portland,  MD, Paused at 03/15/22 0430 ?  MEDLINE mouth rinse, 15 mL, Mouth Rinse, 10 times per day, Tyler Pita, MD, 15 mL at 03/15/22 0603 ?  midodrine (PROAMATINE) tablet 5 mg, 5 mg, Per Tube, TID WC, Kasa, Kurian, MD, 5 mg at 03/15/22 0757 ?  multivitamin liquid 15 mL, 15 mL, Per Tube, Daily, Flora Lipps, MD, 15 mL at 03/14/22 3009 ?  pantoprazole sodium (PROTONIX) 40 mg/20 mL oral suspension 40 mg, 40 mg, Per Tube, Daily, Rust-Chester, Britton L, NP, 40 mg at 03/14/22  2330 ?  polyethylene glycol (MIRALAX / GLYCOLAX) packet 17 g, 17 g, Per Tube, Daily, Rust-Chester, Britton L, NP, 17 g at 03/14/22 0762 ?  propofol (DIPRIVAN) 1000 MG/100ML infusion, 5-80 mcg/kg/min, Intravenous, Titrated, Flora Lipps, MD, Stopped at 03/14/22 0809 ?  thiamine 550m in normal saline (598m IVPB, 500 mg, Intravenous, Q8H, Last Rate: 100 mL/hr at 03/15/22 0601, 500 mg at 03/15/22 0601 **FOLLOWED BY** [START ON 03/18/2022] thiamine (B-1) 250 mg in sodium chloride 0.9 % 50 mL IVPB, 250 mg, Intravenous, Daily **FOLLOWED BY** [START ON 03/20/2022] thiamine (B-1) injection 100 mg, 100 mg, Intravenous, Daily, KaFlora LippsMD ? ?Labs ?CBC ?   ?Component Value Date/Time  ? WBC 10.3 03/13/2022 0419  ? RBC 3.91 03/13/2022 0419  ? HGB 11.7 (L) 03/13/2022 0419  ? HGB 11.0 (L) 02/22/2022 0853  ? HCT 35.7 (L) 03/13/2022 0419  ? PLT 344 03/13/2022 0419  ? PLT 445 (H) 02/22/2022 0853  ? MCV 91.3 03/13/2022 0419  ? MCH 29.9 03/13/2022 0419  ? MCHC 32.8 03/13/2022 0419  ? RDW 14.6 03/13/2022 0419  ? LYMPHSABS 0.8 03/13/2022 0419  ? MONOABS 0.5 03/13/2022 0419  ? EOSABS 0.0 03/13/2022 0419  ? BASOSABS 0.0 03/13/2022 0419  ? ?CMP  ?   ?Component Value Date/Time  ? NA 133 (L) 03/15/2022 0405  ? NA 123 (L) 11/03/2020 1103  ? K 4.5 03/15/2022 0405  ? CL 101 03/15/2022 0405  ? CO2 22 03/15/2022 0405  ? GLUCOSE 115 (H) 03/15/2022 0405  ? BUN 30 (H) 03/15/2022 0405  ? BUN 10 11/03/2020 1103  ? CREATININE 0.99 03/15/2022 0405  ? CREATININE 0.85 02/22/2022 0853  ? CALCIUM 8.0 (L) 03/15/2022 0405  ? PROT 5.2 (L) 03/12/2022 0533  ? ALBUMIN 2.8 (L) 03/12/2022 0533  ? AST 15 03/12/2022 0533  ? AST 13 (L) 02/22/2022 0853  ? ALT 10 03/12/2022 0533  ? ALT 7 02/22/2022 0853  ? ALKPHOS 117 03/12/2022 0533  ? BILITOT 0.6 03/12/2022 0533  ? BILITOT 0.4 02/22/2022 0853  ? GFRNONAA 59 (L) 03/15/2022 0405  ? GFRNONAA >60 02/22/2022 0853  ? GFRAA 93 11/03/2020 1103  ? ? ?glycosylated hemoglobin-6.0 ? ?Lipid Panel  ?   ?Component Value Date/Time   ? CHOL 182 03/11/2022 0333  ? TRIG 131 03/14/2022 0334  ? HDL 57 03/11/2022 0333  ? CHOLHDL 3.2 03/11/2022 0333  ? VLDL 27 03/11/2022 0333  ? LDPlatinum8 03/11/2022 0333  ? LDLDIRECT 86.1 03/06/2012 0841  ?LDL 98 ?TSH elevated and so is T4  ? ?2D echo ?LVEF 60 to 65%, no regional wall motion abnormalities of the left ventricle.  There is mild left ventricular hypertrophy.  Right ventricle systolic function moderately reduced.  Pericardial effusion in anterior to the right ventricle and localized near the right atrium.  No evidence of cardiac tamponade.  Large pleural effusion in the left lateral region.  Mitral valve normal.  Aortic valve tricuspid with no evidence of regurgitation  visualized.  Left atrial size is normal.  No atrial level shunt detected by color-flow Doppler. ? ? 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 ?CT angiography of the head and neck with no emergent large vessel occlusion or hemodynamically significant stenosis of the intracranial or cervical vasculature.  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  Medium size pleural effusions with innumerable bilateral pulmonary nodules-too many to count-most consistent with metastatic disease. ? ?CT angio chest: report reviewed. ?IMPRESSION: ?-No pulmonary embolism. ?-Enlarging bilateral pleural effusions, interval development of ?diffuse ground-glass pulmonary infiltrate most suggestive of ?alveolar pulmonary edema, and development of diffuse body wall and ?retroperitoneal edema in keeping with anasarca. Together, these ?findings may relate to changes of progressive cardiogenic failure ?and echocardiography may be more helpful for further evaluation. ?-Moderate multi-vessel coronary artery calcification. Mild ?cardiomegaly with left ventricular hypertrophy. ?-Grossly stable diffuse subcentimeter  pulmonary nodularity, likely ?benign given its stability over time. See differential ?considerations above. ?-Interval development of a subacute manubrial fracture. ?-Osteopenia with stable changes of a vertebroplas

## 2022-03-15 NOTE — Progress Notes (Signed)
? ?NAME:  Wendy [name], MRN:  [id number], DOB:  May 28, 1946, LOS: 4 ?ADMISSION DATE:  03/10/2022 ? ?SYNOPSIS ?76 yo F presenting to [facility] ED from home via EMS on 03/10/22 with complaints of respiratory distress. Per ED documentation, EMS found the patient dyspneic and hypoxic with SpO2 88% on RA, she was A&O x 4. She received duo-neb, solu medrol, Zofran & magnesium. 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 She reports since her mother switched to immunotherapy she had been doing better, no recent complaints of any kind. Of note, patient was on Eliquis for un-provoked bilateral DVT's until 02/22/22 when this medication was stopped. ? ? ?  ?ED course: ?Upon arrival to ED, as EMS was wheeling her into triage she appeared to have an abrupt change in mental status: staring off into space, nonverbal/unable to follow commands/unresponsive except for eyes being open and blinking to threat. CODE STROKE was initiated. When the patient returned from the [facility] wo contrast her respiratory status declined and her mentation had not improved, EDP made the decision to emergently intubate the patient placing her on mechanical ventilatory support. Tele-neurology assessed the patient and imaging deeming the patient NOT a candidate for TNKase and there was no LVO. ? ?PCCM consulted for admission due to acute respiratory failure in the setting of suspected acute ischemic stroke with emergent intubation requiring mechanical ventilatory support. In discussions bedside with the patient's daughter and EDP, the patient's DNR status was confirmed. Of note, the patient has become responsive, able to follow commands and nod somewhat appropriately. Amy, the patient's daughter would like to leave her mother on mechanical ventilatory support overnight. If she is unable to wean off mechanical ventilation in the morning, she would like to wait until a  follow up MRI can be performed before making the decision to extubate. ? ? ? ?Significant labs:  ?Hematology: WBC: 12.8, Hgb: 12.3,  ?Troponin: 46, BNP: 1562, PCT: pending, ?ABG: 7.37/ 41/ 65/ 23.7 ?CXR 03/10/22: Progressive peribronchial thickening suggestive of pulmonary edema. Bilateral pleural effusions ?CT head wo contrast 03/10/22: no acute intracranial abnormality. ASPECTS 10. ?CTa head/neck 03/11/22: No emergent large vessel occlusion or hemodynamically significant stenosis of the intracranial or cervical arteries. 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 Medium-sized pleural effusions with innumerable bilateral pulmonary nodules, too many to count.  ?CT angio chest 03/11/22: negative for PE. Enlarging bilateral pleural effusions, interval development of diffuse ground-glass pulmonary infiltrate most suggestive of alveolar pulmonary edema & development of diffuse body wall and retroperitoneal edema in keeping with anasarca. Moderate multi-vessel coronary artery calcification. Mild cardiomegaly with left ventricular hypertrophy. Grossly stable diffuse subcentimeter pulmonary nodularity. Interval development of a subacute manubrial fracture. Osteopenia. ?  ?MRI BRAIN +multiple acute ischemic infarcts frontal and parietal   ? ? ? ? ?Significant Hospital Events: ?Including procedures, antibiotic start and stop dates in addition to other pertinent events   ?0/08 acute metabolic encephalopathy and severe resp distress ?Admit to ICU with suspected TIA/ischemic CVA and acute hypoxic respiratory failure in the setting of pulmonary edema on mechanical ventilatory support ?3/20 remains critically ill, on vent, EEG completed,  ?3/20 TEE shows severe plaque disease in arch or aorta ?3/21 remains intubated,sedated ?3/22 failed weaning trials ? ? ? ? ? ?Micro Data:  ?COVID-19 & Influenza A/B: negative ? ?Antimicrobials:  ? ?Antibiotics Given (last 72 hours)   ? ? None  ? ?  ? ? ? ? ? ? ? ?  Interim History / Subjective:   ?Remains critically ill ?Follows commands this AM ?Weaning off pressors ? ? ?Vent Mode: PRVC ?FiO2 (%):  [25 %] 25 % ?Set Rate:  [20 bmp] 20 bmp ?Vt Set:  [420 mL] 420 mL ?PEEP:  [5 cmH20] 5 cmH20 ?Pressure Support:  [5 cmH20] 5 cmH20 ? ? ? ?CBC ?   ?Component Value Date/Time  ? WBC 10.3 03/13/2022 0419  ? RBC 3.91 03/13/2022 0419  ? HGB 11.7 (L) 03/13/2022 0419  ? HGB 11.0 (L) 02/22/2022 0853  ? HCT 35.7 (L) 03/13/2022 0419  ? PLT 344 03/13/2022 0419  ? PLT 445 (H) 02/22/2022 0853  ? MCV 91.3 03/13/2022 0419  ? MCH 29.9 03/13/2022 0419  ? MCHC 32.8 03/13/2022 0419  ? RDW 14.6 03/13/2022 0419  ? LYMPHSABS 0.8 03/13/2022 0419  ? MONOABS 0.5 03/13/2022 0419  ? EOSABS 0.0 03/13/2022 0419  ? BASOSABS 0.0 03/13/2022 0419  ? ? ?  Latest Ref Rng & Units 03/15/2022  ?  4:05 AM 03/14/2022  ?  3:34 AM 03/13/2022  ?  4:19 AM  ?BMP  ?Glucose 70 - 99 mg/dL 115   107   111    ?BUN 8 - 23 mg/dL _0 ?Creatinine 0.44 - 1.00 mg/dL 0.99   1.05   1.39    ?Sodium 135 - 145 mmol/L 133   132   134    ?Potassium 3.5 - 5.1 mmol/L 4.5   4.3   4.9    ?Chloride 98 - 111 mmol/L 101   99   98    ?CO2 22 - 32 mmol/L _1 ?Calcium 8.9 - 10.3 mg/dL 8.0   7.9   8.3    ? ? ? ? ? ? ? ?Objective   ?Blood pressure (!) 141/53, pulse 91, temperature 99.6 ?F (37.6 ?C), temperature source Axillary, resp. rate 20, height 5' 2" (1.575 m), weight 46.5 kg, SpO2 100 %. ?   ?Vent Mode: PRVC ?FiO2 (%):  [25 %] 25 % ?Set Rate:  [20 bmp] 20 bmp ?Vt Set:  [420 mL] 420 mL ?PEEP:  [5 cmH20] 5 cmH20 ?Pressure Support:  [5 cmH20] 5 cmH20  ? ?Intake/Output Summary (Last 24 hours) at 03/15/2022 0715 ?Last data filed at 03/15/2022 0600 ?Gross per 24 hour  ?Intake 1471.63 ml  ?Output 865 ml  ?Net 606.63 ml  ? ? ?Filed Weights  ? 03/13/22 0500 03/14/22 0500 03/15/22 0500  ?Weight: 48.7 kg 47.2 kg 46.5 kg  ? ? ?REVIEW OF SYSTEMS ? ?PATIENT IS UNABLE TO PROVIDE COMPLETE REVIEW OF SYSTEMS DUE TO SEVERE CRITICAL ILLNESS AND INTUBATED ? ? ? ?PHYSICAL  EXAMINATION: ? ?GENERAL:critically ill appearing,  ?EYES: Pupils equal, round, reactive to light.  No scleral icterus.  ?MOUTH: Moist mucosal membrane. INTUBATED ?NECK: Supple.  ?PULMONARY: +rhonchi, +wheezing ?CARDIOVASCULAR: S1 and S2.  No murmurs  ?GASTROINTESTINAL: Soft, nontender, -distended. Positive bowel sounds.  ?MUSCULOSKELETAL: No swelling, clubbing, or edema.  ?NEUROLOGIC: awake ?SKIN:intact,warm,dry ? ? ? ? ?Labs/imaging that I havepersonally reviewed  ?(right click and "Reselect all SmartList Selections" daily)  ? ? ?ASSESSMENT AND PLAN ?SYNOPSIS ? ?76 yo WF admitted to ICU for acute hypoxic resp failure with acute CHF HFpEF exacerbation and inability to protect airway with acute CVA, remains encephalopathic ? ? ?Severe ACUTE Hypoxic and Hypercapnic Respiratory Failure ?-continue Mechanical Ventilator support ?-Wean Fio2 and PEEP as tolerated ?-VAP/VENT bundle implementation ?- Wean  PEEP & FiO2 as tolerated, maintain SpO2 > 88% ?- Head of bed elevated 30 degrees, VAP protocol in place ?- Plateau pressures less than 30 cm H20  ?- Intermittent chest x-ray & ABG PRN ?- Ensure adequate pulmonary hygiene  ?-will perform SAT/SBT when respiratory parameters are met ? ?Vent Mode: PRVC ?FiO2 (%):  [25 %] 25 % ?Set Rate:  [20 bmp] 20 bmp ?Vt Set:  [420 mL] 420 mL ?PEEP:  [5 cmH20] 5 cmH20 ?Pressure Support:  [5 cmH20] 5 cmH20 ? ?CARDIAC FAILURE- HFpEF with pulmonary edema ?-oxygen as needed ?-Lasix as tolerated ?-follow up cardiac enzymes as indicated ? ?CARDIAC ?ICU monitoring ? ? ?ACUTE KIDNEY INJURY/Renal Failure ?-continue Foley Catheter-assess need ?-Avoid nephrotoxic agents ?-Follow urine output, BMP ?-Ensure adequate renal perfusion, optimize oxygenation ?-Renal dose medications ? ? ?Intake/Output Summary (Last 24 hours) at 03/15/2022 0717 ?Last data filed at 03/15/2022 0600 ?Gross per 24 hour  ?Intake 1471.63 ml  ?Output 865 ml  ?Net 606.63 ml  ? ? ? ?INFECTIOUS DISEASE ?COVID NEG ? ?NEUROLOGY ?Acute toxic  metabolic encephalopathy due to Acute CVA based on MRI results ?need for sedation,Goal RASS 0 ?Was Not a candidate for TNKase ?CTH: negative, ASPECTS 10, CTangio head/neck/perfusion: negative for LVO, incidental aneurysm.

## 2022-03-15 NOTE — TOC Progression Note (Signed)
Transition of Care (TOC) - Progression Note  ? ? ?Patient Details  ?Name: Wendy [name]. Delude ?MRN: [id number] ?Date of Birth: 08-25-1946 ? ?Transition of Care (TOC) CM/SW Contact  ?Shelbie Hutching, RN ?Phone Number: ?03/15/2022, 11:03 AM ? ?Clinical Narrative:    ?Patient extubated and tolerating Fitzgerald at 2L currently.  RNCM met with patient at the bedside, she is only able to whisper and is tired.  RNCM will come back to see patient tomorrow.   ? ? ?Expected Discharge Plan: [place] ?Barriers to Discharge: Continued Medical Work up ? ?Expected Discharge Plan and Services ?Expected Discharge Plan: [place] ?  ?Discharge Planning Services: CM Consult ?  ?Living arrangements for the past 2 months: [place] ?                ?  ?  ?  ?  ?  ?  ?  ?  ?  ?  ? ? ?Social Determinants of Health (SDOH) Interventions ?  ? ?Readmission Risk Interventions ?   ? View : No data to display.  ?  ?  ?  ? ? ?

## 2022-03-15 NOTE — Progress Notes (Signed)
Patient unable to [place] at this time with this RN and RN Herschel Senegal at bedside post extubation this morning. Coughed after every sip of water. Speech eval placed.  ?

## 2022-03-15 NOTE — Plan of Care (Addendum)
? ?  Interdisciplinary Goals of Care Family Meeting ? ? ?Date carried out: 03/15/2022 ? ?Location of the meeting: Bedside ? ?Member's involved: Physician, Bedside Registered Nurse, and Family Member or next of kin ? ? ?Code status: Full DNR ? ?Disposition: Continue current acute care ? ? ? ?GOALS OF CARE DISCUSSION ? ?The Clinical status was relayed to family in detail- ? ?Updated and notified of patients medical condition- ?Explained to family course of therapy and the modalities  ? ? ?Family understands the situation. ? ?They have consented and agreed to DNR/DNI  ?Plan for one way extubation-we WILL NOT re-intubate ? ?Family are satisfied with Plan of action and management. All questions answered ? ?Additional CC time 15 mins ? ? ?Corrin Parker, M.D.  ?[facility]  ?Medical Director [place] ?Medical Director [facility] Cardio-Pulmonary Department  ? ? ?

## 2022-03-15 NOTE — Progress Notes (Signed)
Nutrition Follow Up Note  ? ?DOCUMENTATION CODES:  ? ?Severe malnutrition in context of chronic illness ? ?INTERVENTION:  ? ?RD will add supplements once pt's diet is advanced  ? ?Recommend MVI po daily  ? ?Recommend Vitamin C 570m po BID  ? ?NUTRITION DIAGNOSIS:  ? ?Severe Malnutrition related to chronic illness (CHF, poor oral intake) as evidenced by severe fat depletion, severe muscle depletion. ? ?GOAL:  ? ?Patient will meet greater than or equal to 90% of their needs ? ?MONITOR:  ? ?Diet advancement, Labs, Weight trends, Skin, I & O's ? ?ASSESSMENT:  ? 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 ? ?Pt s/p one-way extubation today. SLP evaluation pending. RD will add supplements once pt's diet is advanced. Recommend vitamin C to support wound healing. Per chart, pt is down 8lbs since admission. Pt +1.9L on her I & Os.  ? ?Medications reviewed and include: aspirin, plavix, colace, lovenox, MVI, protonix, miralax, thiamine, B12 ? ?Labs reviewed: Na 133(L), K 4.5 wnl, BUN 30(H), P 3.6 wnl, Mg 2.3 wnl ?154, 128, 91 x 24 hrs ? ?Diet Order:   ?Diet Order   ? ?       ?  Diet NPO time specified  Diet effective midnight       ?  ? ?  ?  ? ?  ? ?EDUCATION NEEDS:  ? ?No education needs have been identified at this time ? ?Skin:  Skin Assessment: Reviewed RN Assessment (ecchymosis, Amyloidosis, Coccyx 0.5X.5cm, inner right labia/vagina fold is also has a deep tissue pressure injury; .3X.3cm) ? ?Last BM:  3/19 ? ?Height:  ? ?Ht Readings from Last 1 Encounters:  ?03/10/22 '5\' 2"'  (1.575 m)  ? ? ?Weight:  ? ?Wt Readings from Last 1 Encounters:  ?03/15/22 46.5 kg  ? ? ?Ideal Body Weight:  50 kg ? ?BMI:  Body mass index is 18.75 kg/m?. ? ?Estimated Nutritional Needs:  ? ?Kcal:  1400-1600kcal/day ? ?Protein:  70-80g/day ? ?Fluid:  1.2-1.4L/day ? ?CKoleen DistanceMS, RD, LDN ?Please refer to AMION for RD and/or RD on-call/weekend/after  hours pager ? ?

## 2022-03-15 NOTE — Consult Note (Addendum)
PHARMACY CONSULT NOTE ? ?Pharmacy Consult for Electrolyte Monitoring and Replacement  ? ?Recent Labs: ?Potassium (mmol/L)  ?Date Value  ?03/15/2022 4.5  ? ?Magnesium (mg/dL)  ?Date Value  ?03/13/2022 2.3  ? ?Calcium (mg/dL)  ?Date Value  ?03/15/2022 8.0 (L)  ? ?Albumin (g/dL)  ?Date Value  ?03/12/2022 2.8 (L)  ? ?Phosphorus (mg/dL)  ?Date Value  ?03/13/2022 3.6  ? ?Sodium (mmol/L)  ?Date Value  ?03/15/2022 133 (L)  ?11/03/2020 123 (L)  ? 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 Patient is currently intubated, sedated, and on mechanical ventilation in the ICU. Pharmacy consulted to assist with electrolyte monitoring and replacement as indicated. ? ?Nutrition: Tube feeds ? ?Goal of Therapy:  ?Electrolytes within normal limits ? ?Plan:  ?--Stable hyponatremia. Continue to monitor  ?--AKI resolved ?--Follow-up electrolytes with periodic labs. Given stability, daily monitoring likely unnecessary  ? ?[name] ?03/15/2022 8:02 AM  ?
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
## 2022-03-15 NOTE — Progress Notes (Signed)
Pt was suctioned for a small amount of thick tan secretions. Per Dr. Zoila Shutter order, she was extubated and placed on a 2L nasal cannula. She has a whispered voice and is without stridor. Her Sa02 is 98%. ?

## 2022-03-16 ENCOUNTER — Telehealth: Payer: Self-pay | Admitting: *Deleted

## 2022-03-16 DIAGNOSIS — I639 Cerebral infarction, unspecified: Secondary | ICD-10-CM

## 2022-03-16 DIAGNOSIS — R569 Unspecified convulsions: Secondary | ICD-10-CM

## 2022-03-16 LAB — GLUCOSE, CAPILLARY
Glucose-Capillary: 91 mg/dL (ref 70–99)
Glucose-Capillary: 76 mg/dL (ref 70–99)
Glucose-Capillary: 91 mg/dL (ref 70–99)
Glucose-Capillary: 131 mg/dL — ABNORMAL HIGH (ref 70–99)

## 2022-03-16 LAB — CARDIOLIPIN ANTIBODIES, IGG, IGM, IGA
Anticardiolipin IgA: <9 APL U/mL (ref 0–11)
Anticardiolipin IgG: <9 GPL U/mL (ref 0–14)
Anticardiolipin IgM: <9 MPL U/mL (ref 0–12)

## 2022-03-16 LAB — BASIC METABOLIC PANEL
Anion gap: 10 (ref 5–15)
BUN: 33 mg/dL — ABNORMAL HIGH (ref 8–23)
CO2: 21 mmol/L — ABNORMAL LOW (ref 22–32)
Calcium: 7.7 mg/dL — ABNORMAL LOW (ref 8.9–10.3)
Chloride: 102 mmol/L (ref 98–111)
Creatinine, Ser: 1.04 mg/dL — ABNORMAL HIGH (ref 0.44–1.00)
GFR, Estimated: 55 mL/min — ABNORMAL LOW (ref >60)
Glucose, Bld: 105 mg/dL — ABNORMAL HIGH (ref 70–99)
Potassium: 4.9 mmol/L (ref 3.5–5.1)
Sodium: 133 mmol/L — ABNORMAL LOW (ref 135–145)

## 2022-03-16 LAB — BETA-2-GLYCOPROTEIN I ABS, IGG/M/A
Beta-2 Glyco I IgG: <9 GPI IgG units (ref 0–20)
Beta-2-Glycoprotein I IgA: <9 GPI IgA units (ref 0–25)
Beta-2-Glycoprotein I IgM: <9 GPI IgM units (ref 0–32)

## 2022-03-16 LAB — CBC
HCT: 30.7 % — ABNORMAL LOW (ref 36.0–46.0)
Hemoglobin: 10.2 g/dL — ABNORMAL LOW (ref 12.0–15.0)
MCH: 30.5 pg (ref 26.0–34.0)
MCHC: 33.2 g/dL (ref 30.0–36.0)
MCV: 91.9 fL (ref 80.0–100.0)
Platelets: 319 10*3/uL (ref 150–400)
RBC: 3.34 MIL/uL — ABNORMAL LOW (ref 3.87–5.11)
RDW: 14.1 % (ref 11.5–15.5)
WBC: 8.9 10*3/uL (ref 4.0–10.5)
nRBC: 0 % (ref 0.0–0.2)

## 2022-03-16 MED ORDER — METOPROLOL TARTRATE 25 MG PO TABS
12.5000 mg | ORAL_TABLET | Freq: Two times a day (BID) | ORAL | Status: DC
  Administered 2022-03-16 – 2022-03-17 (×4): 12.5 mg via ORAL
  Filled 2022-03-16 (×4): qty 1

## 2022-03-16 MED ORDER — ASCORBIC ACID 500 MG PO TABS
500.0000 mg | ORAL_TABLET | Freq: Two times a day (BID) | ORAL | Status: DC
Start: 2022-03-16 — End: 2022-03-19
  Administered 2022-03-16 – 2022-03-19 (×6): 500 mg via ORAL
  Filled 2022-03-16 (×6): qty 1

## 2022-03-16 MED ORDER — VITAMIN D 25 MCG (1000 UNIT) PO TABS
2000.0000 [IU] | ORAL_TABLET | Freq: Every day | ORAL | Status: DC
  Administered 2022-03-16 – 2022-03-19 (×4): 2000 [IU] via ORAL
  Filled 2022-03-16 (×5): qty 2

## 2022-03-16 MED ORDER — ENOXAPARIN SODIUM 40 MG/0.4ML IJ SOSY
40.0000 mg | PREFILLED_SYRINGE | INTRAMUSCULAR | Status: DC
  Administered 2022-03-16 – 2022-03-19 (×4): 40 mg via SUBCUTANEOUS
  Filled 2022-03-16 (×4): qty 0.4

## 2022-03-16 MED ORDER — CALCIUM CARBONATE ANTACID 500 MG PO CHEW: 400.0000 mg | CHEWABLE_TABLET | Freq: Every day | ORAL | Status: DC | PRN

## 2022-03-16 MED ORDER — HYDRALAZINE HCL 50 MG PO TABS
25.0000 mg | ORAL_TABLET | Freq: Four times a day (QID) | ORAL | Status: DC | PRN
  Administered 2022-03-17 (×2): 25 mg via ORAL
  Filled 2022-03-16 (×2): qty 1

## 2022-03-16 MED ORDER — BOOST / RESOURCE BREEZE PO LIQD CUSTOM
1.0000 | Freq: Three times a day (TID) | ORAL | Status: DC
Start: 2022-03-16 — End: 2022-03-19
  Administered 2022-03-17 – 2022-03-19 (×5): 1 via ORAL

## 2022-03-16 MED ORDER — ZINC SULFATE 220 (50 ZN) MG PO CAPS
220.0000 mg | ORAL_CAPSULE | Freq: Every day | ORAL | Status: DC
  Administered 2022-03-16 – 2022-03-19 (×4): 220 mg via ORAL
  Filled 2022-03-16 (×4): qty 1

## 2022-03-16 NOTE — Evaluation (Signed)
Physical Therapy Evaluation ?Patient Details ?Name: Wendy [name] ?MRN: [id number] ?DOB: [birth year] ?Today's Date: 03/16/2022 ? ?History of Present Illness ? Pt is a 76 yo F with PMH that includes: multiple myeloma, HTN, hep-A, osteoporosis, CHF, DVTs, and CAD.  Pt presenting to [facility] ED from home via EMS on 03/10/22 with complaints of respiratory distress. MD assessment includes: Acute respiratory failure with pt intubated now extubated, AMS, acute CVA including multiple small infarcts involving parietal and frontal lobes bilaterally, concern of seizure activity, and severe protein-calorie malnutrition. ?  ?Clinical Impression ? Pt was pleasant and motivated to participate during the session and put forth good effort throughout. Pt did have general BLE weakness but with RLE presenting with increased weakness compared to the LLE, most notably with ankle PF/DF.  Of note pt pushed to the R side with standing activities and required frequent cuing to weight shift back to the left. Pt required physical assistance with all functional tasks most notably to come to standing and to maintain standing balance once up.  Pt was ultimately able to take several small, shuffling steps at the EOB but required heavy assistance to prevent R lateral LOB. No adverse symptoms noted during the session with SpO2 and HR WNL on room air.  Pt will benefit from PT services in a SNF setting upon discharge to safely address deficits listed in patient problem list for decreased caregiver assistance and eventual return to PLOF. ?   ?   ?   ? ?Recommendations for follow up therapy are one component of a multi-disciplinary discharge planning process, led by the attending physician.  Recommendations may be updated based on patient status, additional functional criteria and insurance authorization. ? ?Follow Up Recommendations Skilled nursing-short term rehab (<3 hours/day) ? ?  ?Assistance Recommended at Discharge Frequent or constant  Supervision/Assistance  ?Patient can return home with the following ? Two people to help with walking and/or transfers;Two people to help with bathing/dressing/bathroom;Assistance with cooking/housework;Direct supervision/assist for financial management;Assist for transportation;Help with stairs or ramp for entrance ? ?  ?Equipment Recommendations None recommended by PT  ?Recommendations for Other Services ?    ?  ?Functional Status Assessment Patient has had a recent decline in their functional status and demonstrates the ability to make significant improvements in function in a reasonable and predictable amount of time.  ? ?  ?Precautions / Restrictions Precautions ?Precautions: Fall ?Restrictions ?Weight Bearing Restrictions: No ?Other Position/Activity Restrictions: HOB > 30 deg  ? ?  ? ?Mobility ? Bed Mobility ?Overal bed mobility: Needs Assistance ?Bed Mobility: Supine to Sit, Sit to Supine ?  ?  ?Supine to sit: Min assist ?Sit to supine: Min assist ?  ?General bed mobility comments: Min A for BLE control ?  ? ?Transfers ?Overall transfer level: Needs assistance ?Equipment used: Rolling walker (2 wheels) ?Transfers: Sit to/from Stand ?Sit to Stand: +2 physical assistance, Mod assist ?  ?  ?  ?  ?  ?General transfer comment: Assist to come to standing and to prevent R lateral and posterior LOB while in standing ?  ? ?Ambulation/Gait ?Ambulation/Gait assistance: Mod assist, +2 physical assistance ?Gait Distance (Feet): 1 Feet ?Assistive device: Rolling walker (2 wheels) ?Gait Pattern/deviations: Trunk flexed, Step-to pattern, Decreased step length - right, Decreased step length - left, Shuffle ?Gait velocity: decreased ?  ?  ?General Gait Details: Pt able to take several very small, shuffling steps at the EOB with +2 assist for stability and to advance the RW; pt required heavy cuing for  sequencing to initiate movement ? ?Stairs ?  ?  ?  ?  ?  ? ?Wheelchair Mobility ?  ? ?Modified Rankin (Stroke Patients Only) ?   ? ?  ? ?Balance Overall balance assessment: Needs assistance ?  ?Sitting balance-Leahy Scale: Poor ?Sitting balance - Comments: Generally poor trunk control in sitting with heavy cuing needed for upright posture ?  ?Standing balance support: Bilateral upper extremity supported, During functional activity ?Standing balance-Leahy Scale: Poor ?Standing balance comment: constant +2 Mod A in standing for stability ?  ?  ?  ?  ?  ?  ?  ?  ?  ?  ?  ?   ? ? ? ?Pertinent Vitals/Pain Pain Assessment ?Pain Assessment: No/denies pain  ? ? ?Home Living Family/patient expects to be discharged to:: Private residence ?Living Arrangements: Alone ?Available Help at Discharge: Family;Available PRN/intermittently ?Type of Home: House ?Home Access: Stairs to enter ?Entrance Stairs-Rails: Right;Left ?Entrance Stairs-Number of Steps: 5 ?Alternate Level Stairs-Number of Steps: 14-16 ?Home Layout: Two level;Able to live on main level with bedroom/bathroom ?Home Equipment: Conservation officer, nature (2 wheels);Cane - quad;Shower seat;BSC/3in1 ?   ?  ?Prior Function Prior Level of Function : Independent/Modified Independent ?  ?  ?  ?  ?  ?  ?Mobility Comments: Mod Ind amb limited community distances with a RW, one fall in last 6 months, uses a QC and one rail to go up stairs ?ADLs Comments: Ind with ADLs ?  ? ? ?Hand Dominance  ? Dominant Hand: Right ? ?  ?Extremity/Trunk Assessment  ? Upper Extremity Assessment ?Upper Extremity Assessment: Generalized weakness ?  ? ?Lower Extremity Assessment ?Lower Extremity Assessment: Generalized weakness ?  ? ?   ?Communication  ? Communication: Expressive difficulties  ?Cognition Arousal/Alertness: Awake/alert ?Behavior During Therapy: [facility] for tasks assessed/performed ?Overall Cognitive Status: Within Functional Limits for tasks assessed ?  ?  ?  ?  ?  ?  ?  ?  ?  ?  ?  ?  ?  ?  ?  ?  ?General Comments: A&O to self, date, and location; initially stated 2022 but corrected to 2023 with min cues ?  ?  ? ?  ?General  Comments   ? ?  ?Exercises Total Joint Exercises ?Ankle Circles/Pumps: AROM, Strengthening, Both, 10 reps, 5 reps (limited AROM on RLE) ?Quad Sets: Strengthening, Both, 5 reps, 10 reps ?Gluteal Sets: Strengthening, Both, 10 reps ?Heel Slides: AAROM, Strengthening, Both, 5 reps ?Hip ABduction/ADduction: AAROM, Strengthening, Both, 5 reps ?Straight Leg Raises: AAROM, Strengthening, Both, 5 reps ?Bridges: Strengthening, Both, 5 reps (limited amplitude) ?Other Exercises ?Other Exercises: HEP education for BLE APs, QS, and GS x 10 each every 1-2 hours daily  ? ?Assessment/Plan  ?  ?PT Assessment Patient needs continued PT services  ?PT Problem List Decreased strength;Decreased activity tolerance;Decreased balance;Decreased mobility;Decreased knowledge of use of DME ? ?   ?  ?PT Treatment Interventions DME instruction;Gait training;Stair training;Functional mobility training;Therapeutic activities;Therapeutic exercise;Patient/family education;Balance training;Neuromuscular re-education   ? ?PT Goals (Current goals can be found in the Care Plan section)  ?Acute Rehab PT Goals ?Patient Stated Goal: "To get up" ?PT Goal Formulation: With patient ?Time For Goal Achievement: 03/29/22 ?Potential to Achieve Goals: Good ? ?  ?Frequency 7X/week ?  ? ? ?Co-evaluation PT/OT/SLP Co-Evaluation/Treatment: Yes ?Reason for Co-Treatment: For patient/therapist safety;To address functional/ADL transfers ?PT goals addressed during session: Mobility/safety with mobility ?OT goals addressed during session: ADL's and self-care ?  ? ? ?  ?AM-PAC PT "6 Clicks" Mobility  ?  Outcome Measure Help needed turning from your back to your side while in a flat bed without using bedrails?: A Lot ?Help needed moving from lying on your back to sitting on the side of a flat bed without using bedrails?: A Lot ?Help needed moving to and from a bed to a chair (including a wheelchair)?: A Lot ?Help needed standing up from a chair using your arms (e.g., wheelchair  or bedside chair)?: A Lot ?Help needed to walk in hospital room?: Total ?Help needed climbing 3-5 steps with a railing? : Total ?6 Click Score: 10 ? ?  ?End of Session Equipment Utilized During Treatment: Gait belt ?Act

## 2022-03-16 NOTE — Progress Notes (Signed)
?Progress Note ? ? ?Patient: Wendy [name]. [name] YKD:[id number] DOB: September 04, 1946 DOA: 03/10/2022     5 ?DOS: the patient was seen and examined on 03/16/2022 ?  ?Brief hospital course: ?ICU transfer. ? ?Taken from prior notes. ? ?76 yo F presenting to [facility] ED from home via EMS on 03/10/22 with complaints of respiratory distress. Per ED documentation, EMS found the patient dyspneic and hypoxic with SpO2 88% on RA, she was A&O x 4. She received duo-neb, solu medrol, Zofran & magnesium. 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 She reports since her mother switched to immunotherapy she had been doing better, no recent complaints of any kind. Of note, patient was on Eliquis for un-provoked bilateral DVT's until 02/22/22 when this medication was stopped. ? ?Upon arrival to ED, as EMS was wheeling her into triage she appeared to have an abrupt change in mental status: staring off into space, nonverbal/unable to follow commands/unresponsive except for eyes being open and blinking to threat. CODE STROKE was initiated. When the patient returned from the [facility] wo contrast her respiratory status declined and her mentation had not improved, EDP made the decision to emergently intubate the patient placing her on mechanical ventilatory support. Tele-neurology assessed the patient and imaging deeming the patient NOT a candidate for TNKase and there was no LVO. ? ?CXR 03/10/22: Progressive peribronchial thickening suggestive of pulmonary edema. Bilateral pleural effusions ?CT head wo contrast 03/10/22: no acute intracranial abnormality. ASPECTS 10. ?CTa head/neck 03/11/22: No emergent large vessel occlusion or hemodynamically significant stenosis of the intracranial or cervical arteries. 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 Medium-sized pleural effusions with innumerable bilateral pulmonary nodules, too many to count.  ?CT angio chest 03/11/22:  negative for PE. Enlarging bilateral pleural effusions, interval development of diffuse ground-glass pulmonary infiltrate most suggestive of alveolar pulmonary edema & development of diffuse body wall and retroperitoneal edema in keeping with anasarca. Moderate multi-vessel coronary artery calcification. Mild cardiomegaly with left ventricular hypertrophy. Grossly stable diffuse subcentimeter pulmonary nodularity. Interval development of a subacute manubrial fracture. Osteopenia. ? ?After emergent intubation, case was discussed with patient's daughter who confirmed DNR status but would like to continue with mechanical ventilation for another day and make decision after follow-up MRI.  MRI brain with multiple acute small ischemic infarcts involving bilateral frontal and parietal lobe. ? ?Neurology is on board.  No large vessel occlusion on CTA, echocardiogram with no cardiac source of embolization identified.  No PFO.  No LA appendage thrombus.  Patient was also noted to have transient hypotension which could have caused infarct in watershed area per neurology. ?Patient is without any significant neurologic deficit.  Neurology is recommending Plavix and statin.  No indication for anticoagulation due to no evidence of atrial fibrillation.  Antiphospholipid antibody syndrome was on differential-beta-2 glycoprotein labs and antilupus labs were within normal limit.  Cardiolipin antibodies are pending. ? ?Patient has EEG twice which shows moderate diffuse encephalopathy, she did show generalized periodic discharges with triphasic morphology which can be on the ictal interictal continuum with hypertension for seizures.  She was loaded with Keppra and started on twice daily dose.  Most likely seizures like episode with decreased responsiveness and hypotension.  She might not need Keppra lifelong but neurology is hesitant to take her off for the next few weeks to months.  She needs to have a close follow-up with neurology as  an outpatient to determine the further need. ? ?Patient  was successfully extubated on 03/15/2022.  Currently saturating well on room air. ?Passed swallow evaluation and started on diet.  Foley was removed. ?PT/OT ordered. ?Patient can be transferred out of ICU to [place]. ? ? ? ?Assessment and Plan: ?* Acute respiratory failure ([place]) ?Resolved.  Patient successfully extubated and currently saturating well on room air. ?-Continue to monitor ? ?Acute congestive heart failure ([place]) ?Echocardiogram with normal EF and mild left ventricular hypertrophy. ?Right ventricular dysfunction and dilatation. ?Appears euvolemic. ?-Continue to monitor ? ?Altered mental status ?Most likely with underlying multiple small cerebral infarcts. ?Resolved and now she appears at baseline. ?-Continue to monitor ? ?Acute CVA (cerebrovascular accident) ([place]) ?MRI concerning for multiple small infarcts involving parietal and frontal lobes bilaterally.  Concern of watershed infarcts with sudden hypotension.  No large vessel occlusion.  Echocardiogram was without any source of emboli and no PFO. ?Passed swallow screen. ?Neurology is on board. ?-Continue aspirin, Plavix and Lipitor. ?-Patient will need outpatient neurology follow-up ?-PT/OT evaluation ? ?Seizure ([place]) ?Concern of seizure on first EEG.  Can be due to brain anoxia with hypotension. ?No history of prior seizures. ?She was loaded with Keppra followed by twice daily Keppra dose. ?Per neurology she might not need lifelong Keppra but they are recommending to continue for next few weeks to months.  She will need outpatient neurology follow-up for further recommendations. ?-Continue twice daily Keppra-switch to p.o. ? ?Multiple myeloma ([place]) ?- Outpatient follow-up ? ?Essential hypertension ?Blood pressure elevated. ?-Restart home dose of metoprolol ?-As needed hydralazine for systolic above 161 ? ?Protein-calorie malnutrition, severe ?Estimated body mass index is 20.4 kg/m? as calculated  from the following: ?  Height as of this encounter: _0  (1.575 m). ?  Weight as of this encounter: 50.6 kg.  ? ?-Dietitian consult ? ?  ? ?Subjective: Patient was seen and examined today.  Denies any pain or shortness of breath.  Appears to be at baseline.  Able to communicate well, voice is little muffled. ? ?Physical Exam: ?Vitals:  ? 03/16/22 0600 03/16/22 0700 03/16/22 0800 03/16/22 1200  ?BP: (!) 149/77   (!) 178/102  ?Pulse: 88 90  92  ?Resp: _1 ?Temp:   97.8 ?F (36.6 ?C)   ?TempSrc:   Oral   ?SpO2:  100%  100%  ?Weight:      ?Height:      ? ?General.  Frail elderly lady, in no acute distress. ?Pulmonary.  Lungs clear bilaterally, normal respiratory effort. ?CV.  Regular rate and rhythm, no JVD, rub or murmur. ?Abdomen.  Soft, nontender, nondistended, BS positive. ?CNS.  Alert and oriented .  No focal neurologic deficit. ?Extremities.  No edema, no cyanosis, pulses intact and symmetrical. ?Psychiatry.  Judgment and insight appears normal. ? ?Data Reviewed: ?Prior notes, labs and images reviewed ? ?Family Communication: Daughter on phone. ? ?Disposition: ?Status is: Inpatient ?Remains inpatient appropriate because: Severity of illness ? ? Planned Discharge Destination: To be determined.  Patient seems medically stable now, pending PT and OT evaluation as she was extubated yesterday. ? ?DVT prophylaxis.  Lovenox ? ?Time spent: 55 minutes ? ?This record has been created using Systems analyst. Errors have been sought and corrected,but may not always be located. Such creation errors do not reflect on the standard of care. ? ?Author: ?Lorella Nimrod, MD ?03/16/2022 2:04 PM ? ?For on call review [email or web address].  ?

## 2022-03-16 NOTE — Progress Notes (Addendum)
Neurology Progress Note ? ? ?S:// ?Seen and examined this morning ?Extubated, following commands. ? ?O:// ?Current vital signs: ?BP (!) 149/77   Pulse 90   Temp 97.8 ?F (36.6 ?C) (Oral)   Resp 18   Ht _0  (1.575 m)   Wt 50.6 kg   SpO2 100%   BMI 20.40 kg/m?  ?Vital signs in last 24 hours: ?Temp:  [97.5 ?F (36.4 ?C)-98.6 ?F (37 ?C)] 97.8 ?F (36.6 ?C) (03/24 0800) ?Pulse Rate:  [84-115] 90 (03/24 0700) ?Resp:  [16-23] 18 (03/24 0700) ?BP: (128-159)/(66-92) 149/77 (03/24 0600) ?SpO2:  [94 %-100 %] 100 % (03/24 0700) ?Weight:  [50.6 kg] 50.6 kg (03/24 0329) ?General: Extubated, comfortably sitting in bed in no acute distress ?HEENT: Normocephalic atraumatic ?CVs: Regular rhythm ?Respiratory: Breathing well saturating normally on room air ?Abdomen nondistended nontender ?Neurological exam ?Awake alert oriented x2 ?Speech is hypophonic ?No evidence of aphasia ?Cranial nerves: Pupils are equal round react light, extract movements intact, visual fields full, face appears grossly symmetric, tongue and palate midline. ?Motor examination with mild drift in the right upper extremity and mild drift in the right lower extremity.  Left side appears stronger.  Difficult examination given a lot of generalized weakness and deconditioning. ?Sensation: Intact to touch without extinction ?Coordination difficult to assess but no gross incoordination ? ?Medications ? ?Current Facility-Administered Medications:  ?  0.9 %  sodium chloride infusion, 250 mL, Intravenous, Continuous, Teressa Lower, NP ?  0.9 %  sodium chloride infusion, , Intravenous, Continuous, Furth, Cadence H, PA-C, Stopped at 03/15/22 1975 ?  acetaminophen (TYLENOL) tablet 650 mg, 650 mg, Per Tube, Q6H PRN, Rust-Chester, Toribio Harbour L, NP ?  albuterol (PROVENTIL) (2.5 MG/3ML) 0.083% nebulizer solution 2.5 mg, 2.5 mg, Nebulization, Q4H PRN, Rust-Chester, Britton L, NP ?  aspirin chewable tablet 81 mg, 81 mg, Oral, Daily, [name], RPH ?  atorvastatin  (LIPITOR) tablet 40 mg, 40 mg, Oral, Daily, [name], RPH ?  Chlorhexidine Gluconate Cloth 2 % PADS 6 each, 6 each, Topical, Daily, Tyler Pita, MD, 6 each at 03/15/22 2000 ?  clopidogrel (PLAVIX) tablet 75 mg, 75 mg, Oral, Daily, [name], RPH ?  docusate sodium (COLACE) capsule 100 mg, 100 mg, Oral, BID, [name], RPH ?  enoxaparin (LOVENOX) injection 40 mg, 40 mg, Subcutaneous, Q24H, Belue, Alver Sorrow, RPH ?  hydrocortisone 1 % ointment, , Topical, Daily, Flora Lipps, MD, 1 application. at 03/15/22 1419 ?  insulin aspart (novoLOG) injection 0-5 Units, 0-5 Units, Subcutaneous, QHS, Ouma, Bing Neighbors, NP ?  insulin aspart (novoLOG) injection 0-9 Units, 0-9 Units, Subcutaneous, TID WC, Ouma, Bing Neighbors, NP ?  levETIRAcetam (KEPPRA) IVPB 500 mg/100 mL premix, 500 mg, Intravenous, Q12H, Amie Portland, MD, Last Rate: 400 mL/hr at 03/16/22 0743, 500 mg at 03/16/22 0743 ?  MEDLINE mouth rinse, 15 mL, Mouth Rinse, BID, Ouma, Bing Neighbors, NP ?  multivitamin with minerals tablet 1 tablet, 1 tablet, Oral, Daily, [name], RPH ?  pantoprazole (PROTONIX) EC tablet 40 mg, 40 mg, Oral, Daily, [name], RPH ?  polyethylene glycol (MIRALAX / GLYCOLAX) packet 17 g, 17 g, Oral, Daily, [name], RPH ?  thiamine 572m in normal saline (5110m IVPB, 500 mg, Intravenous, Q8H, Last Rate: 100 mL/hr at 03/16/22 0516, 500 mg at 03/16/22 0516 **FOLLOWED BY** [START ON 03/18/2022] thiamine (B-1) 250 mg in sodium chloride 0.9 % 50 mL IVPB, 250 mg, Intravenous, Daily **FOLLOWED BY** [START ON 03/20/2022] thiamine (B-1)  injection 100 mg, 100 mg, Intravenous, Daily, Kasa, Kurian, MD ?  vitamin B-12 (CYANOCOBALAMIN) tablet 1,000 mcg, 1,000 mcg, Oral, Daily, Flora Lipps, MD ? ?Labs ?CBC ?   ?Component Value Date/Time  ? WBC 8.9 03/16/2022 0310  ? RBC 3.34 (L) 03/16/2022 0310  ? HGB 10.2 (L) 03/16/2022 0310  ? HGB 11.0 (L) 02/22/2022 0853  ? HCT 30.7 (L) 03/16/2022 0310  ? PLT  319 03/16/2022 0310  ? PLT 445 (H) 02/22/2022 0853  ? MCV 91.9 03/16/2022 0310  ? MCH 30.5 03/16/2022 0310  ? MCHC 33.2 03/16/2022 0310  ? RDW 14.1 03/16/2022 0310  ? LYMPHSABS 0.8 03/13/2022 0419  ? MONOABS 0.5 03/13/2022 0419  ? EOSABS 0.0 03/13/2022 0419  ? BASOSABS 0.0 03/13/2022 0419  ? ?CMP  ?   ?Component Value Date/Time  ? NA 133 (L) 03/16/2022 0310  ? NA 123 (L) 11/03/2020 1103  ? K 4.9 03/16/2022 0310  ? CL 102 03/16/2022 0310  ? CO2 21 (L) 03/16/2022 0310  ? GLUCOSE 105 (H) 03/16/2022 0310  ? BUN 33 (H) 03/16/2022 0310  ? BUN 10 11/03/2020 1103  ? CREATININE 1.04 (H) 03/16/2022 0310  ? CREATININE 0.85 02/22/2022 0853  ? CALCIUM 7.7 (L) 03/16/2022 0310  ? PROT 5.2 (L) 03/12/2022 0533  ? ALBUMIN 2.8 (L) 03/12/2022 0533  ? AST 15 03/12/2022 0533  ? AST 13 (L) 02/22/2022 0853  ? ALT 10 03/12/2022 0533  ? ALT 7 02/22/2022 0853  ? ALKPHOS 117 03/12/2022 0533  ? BILITOT 0.6 03/12/2022 0533  ? BILITOT 0.4 02/22/2022 0853  ? GFRNONAA 55 (L) 03/16/2022 0310  ? GFRNONAA >60 02/22/2022 0853  ? GFRAA 93 11/03/2020 1103  ? ? ?glycosylated hemoglobin-6.0 ? ?Lipid Panel  ?   ?Component Value Date/Time  ? CHOL 182 03/11/2022 0333  ? TRIG 131 03/14/2022 0334  ? HDL 57 03/11/2022 0333  ? CHOLHDL 3.2 03/11/2022 0333  ? VLDL 27 03/11/2022 0333  ? [place] 98 03/11/2022 0333  ? LDLDIRECT 86.1 03/06/2012 0841  ?LDL 98 ?TSH elevated and so is T4  ? ?2D echo ?LVEF 60 to 65%, no regional wall motion abnormalities of the left ventricle.  There is mild left ventricular hypertrophy.  Right ventricle systolic function moderately reduced.  Pericardial effusion in anterior to the right ventricle and localized near the right atrium.  No evidence of cardiac tamponade.  Large pleural effusion in the left lateral region.  Mitral valve normal.  Aortic valve tricuspid with no evidence of regurgitation visualized.  Left atrial size is normal.  No atrial level shunt detected by color-flow Doppler. ? ? ?Imaging ?I have reviewed images in epic and  the results pertinent to this consultation are: ?MRI brain with scattered embolic looking infarcts in bilateral high parietal and frontal lobes. ?CT angiography of the head and neck with no emergent large vessel occlusion or hemodynamically significant stenosis of the intracranial or cervical vasculature.  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  Medium size pleural effusions with innumerable bilateral pulmonary nodules-too many to count-most consistent with metastatic disease. ? ?CT angio chest: report reviewed. ?IMPRESSION: ?-No pulmonary embolism. ?-Enlarging bilateral pleural effusions, interval development of ?diffuse ground-glass pulmonary infiltrate most suggestive of ?alveolar pulmonary edema, and development of diffuse body wall and ?retroperitoneal edema in keeping with anasarca. Together, these ?findings may relate to changes of progressive cardiogenic failure ?and echocardiography may be more helpful for further evaluation. ?-Moderate multi-vessel coronary artery calcification. Mild ?cardiomegaly with left ventricular hypertrophy. ?-  Grossly stable diffuse subcentimeter pulmonary nodularity, likely ?benign given its stability over time. See differential ?considerations above. ?-Interval development of a subacute manubrial fracture. 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 ?-Support tubes in appropriate position ? ? ?Transesophageal echocardiogram-no cardiac source of embolization identified.  No PFO.  No LA appendage thrombus. ? ?Repeat EEG yesterday with moderate diffuse encephalopathy-continuous generalized slowing.  This is improved from the EEG from 03/13/2022 which showed generalized periodic discharges with triphasic morphology at 2 Hz which can be on the ictal interictal continuum with hypotension for seizures. ? ?Assessment:  ?76 year old  woman with a history of multiple myeloma, systemic amyloidosis, hypertension, hyperlipidemia, admitted for altered mental status with shortness of breath-code stroke called for staring episode during the time of respir

## 2022-03-16 NOTE — TOC Progression Note (Signed)
Transition of Care (TOC) - Progression Note  ? ? ?Patient Details  ?Name: Wendy [name]. Forget ?MRN: [id number] ?Date of Birth: 06-07-1946 ? ?Transition of Care (TOC) CM/SW Contact  ?Shelbie Hutching, RN ?Phone Number: ?03/16/2022, 10:37 AM ? ?Clinical Narrative:    ?RNCM met with patient at the bedside this morning.  Patient reports that she is doing much better, her voice is stronger but still not much more than a whisper.  Patient is from home where she lives alone, she walks with a walker.  She is currently open with [facility] for PT and OT.  PT and OT evals ordered, patient is agreeable to SNF if that is the recommendation.    ? ? ?Expected Discharge Plan: [place] ?Barriers to Discharge: Continued Medical Work up ? ?Expected Discharge Plan and Services ?Expected Discharge Plan: [place] ?  ?Discharge Planning Services: CM Consult ?  ?Living arrangements for the past 2 months: [place] ?                ?DME Arranged: N/A ?DME Agency: NA ?  ?  ?  ?  ?[place] Agency: [facility] ?Date HH Agency Contacted: 03/16/22 ?Time [place]: [id number] ?Representative spoke with at [place]: Rema Jasmine ? ? ?Social Determinants of Health (SDOH) Interventions ?  ? ?Readmission Risk Interventions ?   ? View : No data to display.  ?  ?  ?  ? ? ?

## 2022-03-16 NOTE — Assessment & Plan Note (Signed)
Most likely with underlying multiple small cerebral infarcts. ?Resolved and now she appears at baseline. ?-Continue to monitor ?

## 2022-03-16 NOTE — Consult Note (Addendum)
PHARMACY CONSULT NOTE ? ?Pharmacy Consult for Electrolyte Monitoring and Replacement  ? ?Recent Labs: ?Potassium (mmol/L)  ?Date Value  ?03/16/2022 4.9  ? ?Magnesium (mg/dL)  ?Date Value  ?03/13/2022 2.3  ? ?Calcium (mg/dL)  ?Date Value  ?03/16/2022 7.7 (L)  ? ?Albumin (g/dL)  ?Date Value  ?03/12/2022 2.8 (L)  ? ?Phosphorus (mg/dL)  ?Date Value  ?03/13/2022 3.6  ? ?Sodium (mmol/L)  ?Date Value  ?03/16/2022 133 (L)  ?11/03/2020 123 (L)  ? 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 Patient is currently intubated, sedated, and on mechanical ventilation in the ICU. Pharmacy consulted to assist with electrolyte monitoring and replacement as indicated. ? ?Patient was extubated 3/23. Passed bedside swallow evaluation 3/24. AKI resolved. ? ?Goal of Therapy:  ?Electrolytes within normal limits ? ?Plan:  ?--Stable hyponatremia. Potassium at ULN. Continue to monitor  ?--Patient care transferred from PCCM to [facility]. Will discontinue electrolyte consult at this time. Defer further ordering of labs and electrolyte replacement to primary team. Pharmacy will continue to follow along peripherally ? ?[name] ?03/16/2022 10:35 AM  ?

## 2022-03-16 NOTE — Assessment & Plan Note (Signed)
Estimated body mass index is 20.4 kg/m? as calculated from the following: ?  Height as of this encounter: '5\' 2"'$  (1.575 m). ?  Weight as of this encounter: 50.6 kg.  ? ?-Dietitian consult ?

## 2022-03-16 NOTE — Evaluation (Signed)
Occupational Therapy Evaluation ?Patient Details ?Name: Wendy [name]. [place] ?MRN: [id number] ?DOB: Aug 28, 1946 ?Today's Date: 03/16/2022 ? ? ?History of Present Illness Pt is a 76 yo F with PMH that includes: multiple myeloma, HTN, hep-A, osteoporosis, CHF, DVTs, and CAD.  Pt presenting to [facility] ED from home via EMS on 03/10/22 with complaints of respiratory distress. MD assessment includes: Acute respiratory failure with pt intubated now extubated, AMS, acute CVA including multiple small infarcts involving parietal and frontal lobes bilaterally, concern of seizure activity, and severe protein-calorie malnutrition.  ? ?Clinical Impression ?  ?Wendy [name] was seen for OT/PT co- evaluation this date. Prior to hospital admission, pt was MOD I using RW for mobility and ADLs. Pt lives alone. Pt presents to acute OT demonstrating impaired ADL performance and functional mobility 2/2 decreased activity tolerance and functional strength/ROM/balance deficits. Pt currently requires MAX A x2 + RW for pericare in standing. SBA grooming seated EOB. SETUP meals at bed level. Pt eager and motivated to improve, instructed in bed level therex.  Pt would benefit from skilled OT to address noted impairments and functional limitations (see below for any additional details). Upon hospital discharge, recommend STR to maximize pt safety and return to PLOF.  ?   ? ?Recommendations for follow up therapy are one component of a multi-disciplinary discharge planning process, led by the attending physician.  Recommendations may be updated based on patient status, additional functional criteria and insurance authorization.  ? ?Follow Up Recommendations ? Skilled nursing-short term rehab (<3 hours/day) (likely improve to CIR with increased tolerance)  ?  ?Assistance Recommended at Discharge Frequent or constant Supervision/Assistance  ?Patient can return home with the following Two people to help with walking and/or transfers;Two people to help with  bathing/dressing/bathroom;Help with stairs or ramp for entrance ? ?  ?Functional Status Assessment ? Patient has had a recent decline in their functional status and demonstrates the ability to make significant improvements in function in a reasonable and predictable amount of time.  ?Equipment Recommendations ? Other (comment) (defer to next venue of care)  ?  ?Recommendations for Other Services   ? ? ?  ?Precautions / Restrictions Precautions ?Precautions: Fall ?Restrictions ?Weight Bearing Restrictions: No ?Other Position/Activity Restrictions: HOB > 30 deg  ? ?  ? ?Mobility Bed Mobility ?Overal bed mobility: Needs Assistance ?Bed Mobility: Supine to Sit, Sit to Supine ?  ?  ?Supine to sit: Min assist ?Sit to supine: Min assist ?  ?  ?  ? ?Transfers ?Overall transfer level: Needs assistance ?Equipment used: Rolling walker (2 wheels) ?Transfers: Sit to/from Stand ?Sit to Stand: Mod assist, +2 physical assistance, From elevated surface ?  ?  ?  ?  ?  ?General transfer comment: Assist to come to standing and to prevent R lateral and posterior LOB while in standing ?  ? ?  ?Balance Overall balance assessment: Needs assistance ?Sitting-balance support: Feet supported, Single extremity supported ?Sitting balance-Leahy Scale: Poor ?  ?  ?Standing balance support: Bilateral upper extremity supported, During functional activity ?Standing balance-Leahy Scale: Poor ?  ?  ?  ?  ?  ?  ?  ?  ?  ?  ?  ?  ?   ? ?ADL either performed or assessed with clinical judgement  ? ?ADL Overall ADL's : Needs assistance/impaired ?  ?  ?  ?  ?  ?  ?  ?  ?  ?  ?  ?  ?  ?  ?  ?  ?  ?  ?  ?  General ADL Comments: MAX A x2 + RW for pericare in standing. SBA grooming seated EOB.  ? ? ? ? ?Pertinent Vitals/Pain Pain Assessment ?Pain Assessment: No/denies pain  ? ? ? ?Hand Dominance Right ?  ?Extremity/Trunk Assessment Upper Extremity Assessment ?Upper Extremity Assessment: Generalized weakness ?  ?Lower Extremity Assessment ?Lower Extremity  Assessment: Generalized weakness ?  ?  ?  ?Communication Communication ?Communication: Expressive difficulties ?  ?Cognition Arousal/Alertness: Awake/alert ?Behavior During Therapy: [facility] for tasks assessed/performed ?Overall Cognitive Status: Within Functional Limits for tasks assessed ?  ?  ?  ?  ?  ?  ?  ?  ?  ?  ?  ?  ?  ?  ?  ?  ?General Comments: initially states 2022\ ?  ?  ?General Comments    ? ?  ?Exercises Exercises: General Upper Extremity ?General Exercises - Upper Extremity ?Shoulder Flexion: AROM, Strengthening, Both, 10 reps, Supine ?Shoulder ABduction: AROM, Strengthening, Both, 10 reps, Supine ?Shoulder ADduction: AROM, Strengthening, Both, 10 reps, Supine ?Elbow Flexion: AROM, Strengthening, Both, 10 reps, Supine ?Elbow Extension: AROM, Strengthening, Both, 10 reps, Supine ?  ?Shoulder Instructions    ? ? ?Home Living Family/patient expects to be discharged to:: Private residence ?Living Arrangements: Alone ?Available Help at Discharge: Family;Available PRN/intermittently ?Type of Home: House ?Home Access: Stairs to enter ?Entrance Stairs-Number of Steps: 5 ?Entrance Stairs-Rails: Right;Left ?Home Layout: Two level;Able to live on main level with bedroom/bathroom ?Alternate Level Stairs-Number of Steps: 14-16 ?Alternate Level Stairs-Rails: Right;Left ?Bathroom Shower/Tub: Tub/shower unit ?  ?Bathroom Toilet: Standard ?  ?  ?Home Equipment: Conservation officer, nature (2 wheels);Cane - quad;Shower seat;BSC/3in1 ?  ?  ?  ? ?  ?Prior Functioning/Environment Prior Level of Function : Independent/Modified Independent ?  ?  ?  ?  ?  ?  ?Mobility Comments: Mod Ind amb limited community distances with a RW, one fall in last 6 months, uses a QC and one rail to go up stairs ?ADLs Comments: Ind with ADLs ?  ? ?  ?  ?OT Problem List: Decreased strength;Decreased activity tolerance;Impaired balance (sitting and/or standing);Decreased safety awareness ?  ?   ?OT Treatment/Interventions: Self-care/ADL training;Therapeutic  exercise;Energy conservation;DME and/or AE instruction;Therapeutic activities;Patient/family education;Balance training  ?  ?OT Goals(Current goals can be found in the care plan section) Acute Rehab OT Goals ?Patient Stated Goal: to get up ?OT Goal Formulation: With patient ?Time For Goal Achievement: 03/30/22 ?Potential to Achieve Goals: Good ?ADL Goals ?Pt Will Perform Grooming: standing;with min guard assist ?Pt Will Perform Lower Body Dressing: with min assist;sit to/from stand ?Pt Will Transfer to Toilet: with min assist;ambulating;bedside commode ?Pt Will Perform Toileting - Clothing Manipulation and hygiene: with min assist;sitting/lateral leans  ?OT Frequency: Min 3X/week ?  ? ?Co-evaluation PT/OT/SLP Co-Evaluation/Treatment: Yes ?Reason for Co-Treatment: For patient/therapist safety;To address functional/ADL transfers ?PT goals addressed during session: Mobility/safety with mobility ?OT goals addressed during session: ADL's and self-care ?  ? ?  ?AM-PAC OT "6 Clicks" Daily Activity     ?Outcome Measure Help from another person eating meals?: None ?Help from another person taking care of personal grooming?: A Little ?Help from another person toileting, which includes using toliet, bedpan, or urinal?: A Lot ?Help from another person bathing (including washing, rinsing, drying)?: A Lot ?Help from another person to put on and taking off regular upper body clothing?: A Little ?Help from another person to put on and taking off regular lower body clothing?: A Lot ?6 Click Score: 16 ?  ?End of Session Equipment Utilized  During Treatment: Rolling walker (2 wheels);Gait belt ?Nurse Communication: Mobility status ? ?Activity Tolerance: Patient tolerated treatment well ?Patient left: in bed;with call bell/phone within reach;with bed alarm set ? ?OT Visit Diagnosis: Unsteadiness on feet (R26.81)  ?              ?Time: 0737-1062 ?OT Time Calculation (min): 24 min ?Charges:  OT General Charges ?$OT Visit: 1 Visit ?OT  Evaluation ?$OT Eval Moderate Complexity: 1 Mod ?OT Treatments ?$Self Care/Home Management : 8-22 mins ? ?Dessie Coma, M.S. OTR/L  ?03/16/22, 3:52 PM  ?ascom [phone] ? ?

## 2022-03-16 NOTE — Progress Notes (Addendum)
Initial Nutrition Assessment ? ?DOCUMENTATION CODES:  ? ?Severe malnutrition in context of chronic illness ? ?INTERVENTION:  ? ?-Boost Breeze po TID, each supplement provides 250 kcal and 9 grams of protein  ?-MVI with minerals daily ?-Magic cup TID with meals, each supplement provides 290 kcal and 9 grams of protein  ?-500 mg vitamin C BID ?-220 mg zinc sulfate daily x 14 days ? ?NUTRITION DIAGNOSIS:  ? ?Severe Malnutrition related to chronic illness (CHF, poor oral intake) as evidenced by severe fat depletion, severe muscle depletion. ? ?Ongoing ? ?GOAL:  ? ?Patient will meet greater than or equal to 90% of their needs ? ?Progressing  ? ?MONITOR:  ? ?Diet advancement, Labs, Weight trends, Skin, I & O's ? ?REASON FOR ASSESSMENT:  ? ?Consult ?Enteral/tube feeding initiation and management ? ?ASSESSMENT:  ? 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 ? ?3/23- extubated ?3/24- s/p BSE- advanced to dysphagia 1 diet with thin liquids ? ?Reviewed I/O's: -717 ml x 24 hours and +1.5 L since admission ? ?UOP: 800 ml x 24 hours  ? ?Case discussed with SLP. Pt has been advanced to a dysphagia 1 diet with thin liquids. SLP reports pt likes Boost Breeze supplements, however, only takes a few sips of supplement. She is easily distractable but does well with "feeding tasks". She consumed an entire container of applesauce per SLP.  ? ?Medications reviewed and include colace, miralax, thiamine, and vitamin B-12.  ? ?Labs reviewed: Na: 133, CBGS: 76-128 (inpatient orders for glycemic control are 0-9 units insulin aspart TID with meals and 0-5 units inuslin aspart daily at bedtime).   ? ?Diet Order:   ?Diet Order   ? ?       ?  DIET - DYS 1 Room service appropriate? Yes with Assist; Fluid consistency: Thin  Diet effective now       ?  ? ?  ?  ? ?  ? ? ?EDUCATION NEEDS:  ? ?No education needs have been identified at this time ? ?Skin:   Skin Assessment: Reviewed RN Assessment (ecchymosis, Amyloidosis, Coccyx 0.5X.5cm, inner right labia/vagina fold is also has a deep tissue pressure injury; .3X.3cm) ? ?Last BM:  03/11/22 ? ?Height:  ? ?Ht Readings from Last 1 Encounters:  ?03/10/22 _0  (1.575 m)  ? ? ?Weight:  ? ?Wt Readings from Last 1 Encounters:  ?03/16/22 50.6 kg  ? ? ?Ideal Body Weight:  50 kg ? ?BMI:  Body mass index is 20.4 kg/m?. ? ?Estimated Nutritional Needs:  ? ?Kcal:  1400-1600kcal/day ? ?Protein:  70-80g/day ? ?Fluid:  1.2-1.4L/day ? ? ? ?Loistine Chance, RD, LDN, CDCES ?Registered Dietitian II ?Certified Diabetes Care and Education Specialist ?Please refer to [facility] for RD and/or RD on-call/weekend/after hours pager  ?

## 2022-03-16 NOTE — Assessment & Plan Note (Signed)
-   Outpatient follow-up °

## 2022-03-16 NOTE — Progress Notes (Signed)
Patient able to pass Swallow Screen at bedside with RN Lysbeth Galas and RN Herschel Senegal. Tolerating ice chips and sips without coughing at this time. Oral care performed.  ?

## 2022-03-16 NOTE — Progress Notes (Signed)
Speech Language Pathology Treatment: Dysphagia  ?Patient Details ?Name: Wendy [name]. [place] ?MRN: [id number] ?DOB: 12-Nov-1946 ?Today's Date: 03/16/2022 ?Time: 2706-2376 ?SLP Time Calculation (min) (ACUTE ONLY): 60 min ? ?Assessment / Plan / Recommendation ?Clinical Impression ? Pt seen for ongoing assessment of swallowing. She was alert, verbally responsive and engaged in conversation w/ SLP; much improved vocal quality and projection of voice today vs at evaluation yesterday. Pt was able to follow through w/ instructions given cues. Noted mild phlegmy cough at Baseline PRIOR to po's.  ? ?Pt explained general aspiration precautions and agreed verbally to the need for following them especially sitting upright for all oral intake -- supported behind the back for full upright sitting. Also discussed the need to take SMALL, SINGLE sips of thin liquids via Cup SLOWLY -- pt agreed and demonstrated such give verbal cues as reminders.  ?She fed herself trials of ice chips, thin liquids then purees w/ No immediate, overt clinical s/s of aspiration noted w/ any consistency; respiratory status remained calm and unlabored, O2 sats 98-99%, vocal quality clear b/t trials, no cough. Pt demonstrated a mild phlegmy throat clearing x2 b/t trials -- similar to noted prior to po's beginning. NSG reported a similar throat clear during ice chips w/ her this morning(per MD ok for the ice chips, but no decline in status). Oral phase appeared grossly [facility] for bolus management and timely A-P transfer for swallowing; oral clearing achieved w/ all consistencies. Mildly distracted intermittently but redirected to po tasks appropriately. ?  ?Pt's medical status appears to be improving overall as per consult w/ NSG. Pt appears at reduced risk for aspriation when following aspiration precautions and using a modified diet consistency initially to best support safe oral intake. Recommend a dysphagia level 1 (puree) w/ gravies added to moisten foods; Thin  liquids VIA CUP. Recommend aspiration precautions; Supervision at meals to monitor status; Pills CRUSHED in Puree; tray setup and positioning assistance for meals. ST services will continue to monitor for appropriateness to upgrade diet consistency next 1-3 days. NSG updated. Precautions posted at bedside.  Family arrived at end of session and given update on pt's therapy session and diet recommendation w/ precautions.  ? ? ? ? ? ?  ?HPI HPI: Pt is a 76 yo F presenting to [facility] ED from home via EMS on 03/10/22 with complaints of respiratory distress. Per ED documentation, EMS found the patient dyspneic and hypoxic with SpO2 88% on RA, though she was A&O x 4. She received duo-neb, solu medrol, Zofran & magnesium.  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 She reports since her mother switched to immunotherapy she had been doing better, no recent complaints of any kind. Of note, patient was on Eliquis for un-provoked bilateral DVT's until 02/22/22 when this medication was stopped.     ED course:  Upon arrival to ED, as EMS was wheeling her into triage she appeared to have an abrupt change in mental status: staring off into space, nonverbal/unable to follow commands/unresponsive except for eyes being open and blinking to threat. CODE STROKE was initiated. When the patient returned from the [facility] wo contrast her respiratory status declined and her mentation had not improved, EDP made the decision to emergently intubate the patient placing her on mechanical ventilatory support. Tele-neurology assessed the patient and imaging deeming the patient NOT a candidate for TNKase.  Even post intubation, pt was able to follow general commands and answer some  questions per NSG notes.  Mitts and meds given d/t agitation.  EEG completed on 03/14/2022 suggestive of "moderate diffuse encephalopathy, nonspecific etiology. No seizures or definite  epileptiform discharges were seen throughout the recording.".   CXR: Persistent bilateral pleural effusions with hazy densities in the  right lower chest. Findings have minimally changed since 03/10/2022.  2. Prominent interstitial lung markings have minimally changed.  MRI: Multiple small acute infarcts in the high frontal and parietal  lobes bilaterally.  2. Chronic microvascular disease and atrophy. ?  ?   ?SLP Plan ? Continue with current plan of care ? ?  ?  ?Recommendations for follow up therapy are one component of a multi-disciplinary discharge planning process, led by the attending physician.  Recommendations may be updated based on patient status, additional functional criteria and insurance authorization. ?  ? ?Recommendations  ?Diet recommendations: Dysphagia 1 (puree);Thin liquid ?Liquids provided via: Cup;No straw ?Medication Administration: Crushed with puree ?Supervision: Patient able to self feed;Staff to assist with self feeding;Intermittent supervision to cue for compensatory strategies ?Compensations: Minimize environmental distractions;Slow rate;Small sips/bites;Lingual sweep for clearance of pocketing;Follow solids with liquid ?Postural Changes and/or Swallow Maneuvers: Out of bed for meals;Seated upright 90 degrees;Upright 30-60 min after meal  ?   ?    ?   ? ? ? ? General recommendations:  (Dietician f/u) ?Oral Care Recommendations: Oral care BID;Oral care before and after PO;Staff/trained caregiver to provide oral care (support pt) ?Follow Up Recommendations: Skilled nursing-short term rehab (<3 hours/day) (TBD) ?Assistance recommended at discharge: Frequent or constant Supervision/Assistance ?SLP Visit Diagnosis: Dysphagia, pharyngeal phase (R13.13) ?Plan: Continue with current plan of care ? ? ? ? ?  ?  ? ? ? ? ? ? ?Orinda Kenner, MS, CCC-SLP ?Speech Language Pathologist ?Rehab Services; [place] ?[phone] (ascom) ?Sitlaly Gudiel ? ?03/16/2022, 3:37 PM ?

## 2022-03-16 NOTE — Telephone Encounter (Signed)
TCT patient's daughter, Wendy [name] in regards to her mom's current condition. We are aware that pt has been hospitalized @ [facility] with CVA and resp issues.  Spoke with Amy. She states that her mom is doing much better. She was extubated yesterday and is breathing well on her own. She has started eating some-applesauce type foods and is speaking. Discharge date is unclear at this time as PT/OT has just started working with her today.  Amy is hopeful that her mom can go to [place] as planned to assisted living at the time of discharge.  Advised that her mom has appts here on 03/22/22. She agrees that we should cancel those.  We will keep the appts in April. Amy states she will keep [place] updated. ?Dr. Lorenso Courier made aware. ?

## 2022-03-16 NOTE — Assessment & Plan Note (Signed)
Resolved.  Patient successfully extubated and currently saturating well on room air. ?-Continue to monitor ?

## 2022-03-16 NOTE — Assessment & Plan Note (Addendum)
MRI concerning for multiple small infarcts involving parietal and frontal lobes bilaterally.  Concern of watershed infarcts with sudden hypotension.  No large vessel occlusion.  Echocardiogram was without any source of emboli and no PFO. ?Passed swallow screen. ?Neurology is on board. ?-Continue aspirin, Plavix and Lipitor. ?-Patient will need outpatient neurology follow-up ?-PT/OT evaluation ?

## 2022-03-16 NOTE — Assessment & Plan Note (Signed)
Blood pressure elevated. ?-Restart home dose of metoprolol ?-As needed hydralazine for systolic above 464 ?

## 2022-03-16 NOTE — Hospital Course (Addendum)
ICU transfer. ? ?Taken from prior notes. ? ?76 yo F presenting to [facility] ED from home via EMS on 03/10/22 with complaints of respiratory distress. Per ED documentation, EMS found the patient dyspneic and hypoxic with SpO2 88% on RA, she was A&O x 4. She received duo-neb, solu medrol, Zofran & magnesium. 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 She reports since her mother switched to immunotherapy she had been doing better, no recent complaints of any kind. Of note, patient was on Eliquis for un-provoked bilateral DVT's until 02/22/22 when this medication was stopped. ? ?Upon arrival to ED, as EMS was wheeling her into triage she appeared to have an abrupt change in mental status: staring off into space, nonverbal/unable to follow commands/unresponsive except for eyes being open and blinking to threat. CODE STROKE was initiated. When the patient returned from the [facility] wo contrast her respiratory status declined and her mentation had not improved, EDP made the decision to emergently intubate the patient placing her on mechanical ventilatory support. Tele-neurology assessed the patient and imaging deeming the patient NOT a candidate for TNKase and there was no LVO. ? ?CXR 03/10/22: Progressive peribronchial thickening suggestive of pulmonary edema. Bilateral pleural effusions ?CT head wo contrast 03/10/22: no acute intracranial abnormality. ASPECTS 10. ?CTa head/neck 03/11/22: No emergent large vessel occlusion or hemodynamically significant stenosis of the intracranial or cervical arteries. 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 Medium-sized pleural effusions with innumerable bilateral pulmonary nodules, too many to count.  ?CT angio chest 03/11/22: negative for PE. Enlarging bilateral pleural effusions, interval development of diffuse ground-glass pulmonary infiltrate most suggestive of alveolar pulmonary edema &  development of diffuse body wall and retroperitoneal edema in keeping with anasarca. Moderate multi-vessel coronary artery calcification. Mild cardiomegaly with left ventricular hypertrophy. Grossly stable diffuse subcentimeter pulmonary nodularity. Interval development of a subacute manubrial fracture. Osteopenia. ? ?After emergent intubation, case was discussed with patient's daughter who confirmed DNR status but would like to continue with mechanical ventilation for another day and make decision after follow-up MRI.  MRI brain with multiple acute small ischemic infarcts involving bilateral frontal and parietal lobe. ? ?Neurology is on board.  No large vessel occlusion on CTA, echocardiogram with no cardiac source of embolization identified.  No PFO.  No LA appendage thrombus.  Patient was also noted to have transient hypotension which could have caused infarct in watershed area per neurology. ?Patient is without any significant neurologic deficit.  Neurology is recommending Plavix and statin.  No indication for anticoagulation due to no evidence of atrial fibrillation.  Antiphospholipid antibody syndrome was on differential-beta-2 glycoprotein labs and antilupus labs were within normal limit.  Cardiolipin antibodies are pending. ? ?Patient has EEG twice which shows moderate diffuse encephalopathy, she did show generalized periodic discharges with triphasic morphology which can be on the ictal interictal continuum with hypertension for seizures.  She was loaded with Keppra and started on twice daily dose.  Most likely seizures like episode with decreased responsiveness and hypotension.  She might not need Keppra lifelong but neurology is hesitant to take her off for the next few weeks to months.  She needs to have a close follow-up with neurology as an outpatient to determine the further need. ? ?Patient was successfully extubated on 03/15/2022.  Currently saturating well on room air. ?Passed swallow evaluation and  started on diet.  Foley was removed. ?PT/OT ordered-recommending SNF ?Patient was transferred to [facility]. ? ?  Palliative care was also consulted to discuss goals of care as appetite remained poor. ? ?Remained stable for discharge to rehab.  Patient was started on multiple new medications which include amlodipine 5 mg daily and increased dose of metoprolol to 25 mg twice daily for persistently elevated blood pressure, Keppra for concern of seizure-like activity and she should continue taking Keppra until she will sees a neurologist as an outpatient and they can determine the further need.  She cannot drive until that time. ? ?For stroke she will stay on aspirin and Plavix together for 1 month, followed by Plavix only. ? ?Patient also had some constipation, please continue bowel regimen and back off if she develops diarrhea. ? ?Please supplement her diet with Ensure or boost. ? ?She will continue current medications as directed and follow-up with her providers. ? ? ?

## 2022-03-16 NOTE — Assessment & Plan Note (Signed)
Echocardiogram with normal EF and mild left ventricular hypertrophy. ?Right ventricular dysfunction and dilatation. ?Appears euvolemic. ?-Continue to monitor ?

## 2022-03-16 NOTE — Assessment & Plan Note (Signed)
Concern of seizure on first EEG.  Can be due to brain anoxia with hypotension. ?No history of prior seizures. ?She was loaded with Keppra followed by twice daily Keppra dose. ?Per neurology she might not need lifelong Keppra but they are recommending to continue for next few weeks to months.  She will need outpatient neurology follow-up for further recommendations. ?-Continue twice daily Keppra-switch to p.o. ?

## 2022-03-16 NOTE — NC FL2 (Signed)
?[place] MEDICAID FL2 LEVEL OF CARE SCREENING TOOL  ?  ? ?IDENTIFICATION  ?Patient Name: ?Wendy [name]. Mohammad Birthdate: 02/20/1946 Sex: female Admission Date (Current Location): ?03/10/2022  ?[place] and [place] Number: ? [place] ?  Facility and Address:  ?[facility], [address], [place], [place] [address] ?     Provider Number: ?[id number]  ?Attending Physician Name and Address:  ?Lorella Nimrod, MD ? Relative Name and Phone Number:  ?Darnell Level daughter- [phone] ?   ?Current Level of Care: ?Hospital Recommended Level of Care: ?[place] Prior Approval Number: ?  ? ?Date Approved/Denied: ?  PASRR Number: ?[id number] A ? ?Discharge Plan: ?SNF ?  ? ?Current Diagnoses: ?Patient Active Problem List  ? Diagnosis Date Noted  ? Protein-calorie malnutrition, severe 03/12/2022  ? Acute congestive heart failure ([place])   ? Altered mental status   ? Acute respiratory failure ([place]) 03/11/2022  ? Pain in buttock 02/09/2022  ? Low back pain 02/09/2022  ? Poor balance 02/09/2022  ? History of fall 02/09/2022  ? Generalized weakness 02/09/2022  ? Stress reaction 12/24/2021  ? Current use of proton pump inhibitor 10/27/2021  ? Full code status 05/31/2021  ? Flash pulmonary edema ([place]) 04/30/2021  ? Heme positive stool 04/30/2021  ? Hyperkalemia 04/30/2021  ? Compression fracture of T10 vertebra ([place]) 03/14/2021  ? History of DVT (deep vein thrombosis) 03/13/2021  ? Multiple myeloma ([place]) 02/10/2021  ? Leucocytosis 02/10/2021  ? 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 Situational anxiety 01/11/2021  ? 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  ? Hyperlipidemia 10/19/2020  ? Hearing loss 06/14/2020  ? Pedal edema 06/01/2020  ? Aortic atherosclerosis ([place]) 04/06/2020  ? CAD (coronary artery disease) 04/06/2020  ? H/O compression fracture of spine 04/06/2020  ? Chronic back pain 04/06/2020  ? Abnormal CT scan of lung 03/17/2020  ? Heartburn 06/02/2018  ? Chronic constipation 12/31/2017  ? Blood  glucose elevated 09/25/2017  ? History of ileus 06/07/2017  ? Right carpal tunnel syndrome 12/07/2016  ? Estrogen deficiency 09/24/2016  ? Routine general medical examination at a health care facility 09/04/2015  ? Colon cancer screening 12/11/2014  ? Encounter for Medicare annual wellness exam 05/17/2013  ? Osteoarthritis 03/28/2011  ? Degenerative disc disease, lumbar 03/28/2011  ? Hyponatremia 02/12/2011  ? Essential hypertension 08/01/2010  ? Osteoporosis 08/01/2010  ? ? ?Orientation RESPIRATION BLADDER Height & Weight   ?  ?Self, Situation, Place ? Normal External catheter Weight: 50.6 kg ?Height:  '5\' 2"'  (157.5 cm)  ?BEHAVIORAL SYMPTOMS/MOOD NEUROLOGICAL BOWEL NUTRITION STATUS  ?    Continent Diet (see discharge summary)  ?AMBULATORY STATUS COMMUNICATION OF NEEDS Skin   ?Extensive Assist Verbally PU Stage and Appropriate Care, Other (Comment) (deep tissue injury cocyxx and vaginal fold- erythema to face- skin tears- arms and legs, brusing to all exremities.) ?  ?  ?  ?    ?     ?     ? ? ?Personal Care Assistance Level of Assistance  ?Bathing, Feeding, Dressing Bathing Assistance: Maximum assistance ?Feeding assistance: Limited assistance ?Dressing Assistance: Maximum assistance ?   ? ?Functional Limitations Info  ?Sight, Hearing, Speech Sight Info: Impaired ?Hearing Info: Adequate ?Speech Info: Impaired  ? ? ?SPECIAL CARE FACTORS FREQUENCY  ?PT (By licensed PT), OT (By licensed OT), Speech therapy   ?  ?  ?  ?  ?  ?  ?   ? ? ?Contractures Contractures Info: Not present  ? ? ?Additional Factors Info  ?Code Status, Allergies  Code Status Info: DNR ?Allergies Info: Bentyl, butalbital, clonidine, linzess, morphine and related, motrin, PCN, sulfa antibiotics, zanaflex, diphenhydramine ?  ?  ?  ?   ? ?Current Medications (03/16/2022):  This is the current hospital active medication list ?Current Facility-Administered Medications  ?Medication Dose Route Frequency Provider Last Rate Last Admin  ? 0.9 %  sodium chloride  infusion  250 mL Intravenous Continuous Teressa Lower, NP      ? 0.9 %  sodium chloride infusion   Intravenous Continuous Furth, Cadence H, PA-C   Stopped at 03/15/22 0748  ? acetaminophen (TYLENOL) tablet 650 mg  650 mg Per Tube Q6H PRN Rust-Chester, Toribio Harbour L, NP      ? albuterol (PROVENTIL) (2.5 MG/3ML) 0.083% nebulizer solution 2.5 mg  2.5 mg Nebulization Q4H PRN Rust-Chester, Toribio Harbour L, NP      ? aspirin chewable tablet 81 mg  81 mg Oral Daily [name], RPH      ? atorvastatin (LIPITOR) tablet 40 mg  40 mg Oral Daily [name], RPH      ? Chlorhexidine Gluconate Cloth 2 % PADS 6 each  6 each Topical Daily Tyler Pita, MD   6 each at 03/15/22 2000  ? clopidogrel (PLAVIX) tablet 75 mg  75 mg Oral Daily [name], RPH      ? docusate sodium (COLACE) capsule 100 mg  100 mg Oral BID [name], RPH      ? enoxaparin (LOVENOX) injection 40 mg  40 mg Subcutaneous Q24H Renda Rolls, RPH      ? hydrocortisone 1 % ointment   Topical Daily Flora Lipps, MD   1 application. at 03/15/22 1419  ? insulin aspart (novoLOG) injection 0-5 Units  0-5 Units Subcutaneous QHS Lang Snow, NP      ? insulin aspart (novoLOG) injection 0-9 Units  0-9 Units Subcutaneous TID WC Lang Snow, NP      ? levETIRAcetam (KEPPRA) IVPB 500 mg/100 mL premix  500 mg Intravenous Q12H Amie Portland, MD 400 mL/hr at 03/16/22 0743 500 mg at 03/16/22 0743  ? MEDLINE mouth rinse  15 mL Mouth Rinse BID Lang Snow, NP      ? multivitamin with minerals tablet 1 tablet  1 tablet Oral Daily [name], RPH      ? pantoprazole (PROTONIX) EC tablet 40 mg  40 mg Oral Daily [name], RPH      ? polyethylene glycol (MIRALAX / GLYCOLAX) packet 17 g  17 g Oral Daily [name], RPH      ? thiamine 532m in normal saline (563m IVPB  500 mg Intravenous Q8Barnabas ListerMD 100 mL/hr at 03/16/22 0516 500 mg at 03/16/22 0516  ? Followed by  ? [START ON 03/18/2022] thiamine  (B-1) 250 mg in sodium chloride 0.9 % 50 mL IVPB  250 mg Intravenous Daily KaFlora LippsMD      ? Followed by  ? [SDerrill MemoN 03/20/2022] thiamine (B-1) injection 100 mg  100 mg Intravenous Daily Kasa, Kurian, MD      ? vitamin B-12 (CYANOCOBALAMIN) tablet 1,000 mcg  1,000 mcg Oral Daily KaFlora LippsMD      ? ? ? ?Discharge Medications: ?Please see discharge summary for a list of discharge medications. ? ?Relevant Imaging Results: ? ?Relevant Lab Results: ? ? ?Additional Information ?SSN [id number] ?JeShelbie HutchingRN ? ? ? ? ?

## 2022-03-17 DIAGNOSIS — J9601 Acute respiratory failure with hypoxia: Secondary | ICD-10-CM | POA: Diagnosis not present

## 2022-03-17 DIAGNOSIS — I509 Heart failure, unspecified: Secondary | ICD-10-CM | POA: Diagnosis not present

## 2022-03-17 LAB — CULTURE, RESPIRATORY W GRAM STAIN: Culture: NORMAL

## 2022-03-17 LAB — GLUCOSE, CAPILLARY
Glucose-Capillary: 89 mg/dL (ref 70–99)
Glucose-Capillary: 93 mg/dL (ref 70–99)
Glucose-Capillary: 86 mg/dL (ref 70–99)
Glucose-Capillary: 88 mg/dL (ref 70–99)

## 2022-03-17 MED ORDER — LEVETIRACETAM 500 MG PO TABS
500.0000 mg | ORAL_TABLET | Freq: Two times a day (BID) | ORAL | Status: DC
  Administered 2022-03-17: 500 mg via ORAL
  Filled 2022-03-17: qty 1

## 2022-03-17 NOTE — TOC Progression Note (Addendum)
Transition of Care (TOC) - Progression Note  ? ? ?Patient Details  ?Name: Wendy [name]. Babin ?MRN: [id number] ?Date of Birth: November 23, 1946 ? ?Transition of Care (TOC) CM/SW Contact  ?[place], LCSWA ?Phone Number: ?03/17/2022, 9:42 AM ? ?Clinical Narrative:    ? ?CSW spoke with patient's daughter Wendy [name](POA) at [phone] to update her about PT's recommendation for SNF. Wendy expressed being agreeable to SNF and then stepping down to [place] afterwards. CSW presented SNFs that accepted patient and encouraged her to research to decide preference. CSW is waiting to hear back from Wendy with decision. TOC will continue to follow.  ? ? ?Expected Discharge Plan: [place] ?Barriers to Discharge: Continued Medical Work up ? ?Expected Discharge Plan and Services ?Expected Discharge Plan: [place] ?  ?Discharge Planning Services: CM Consult ?  ?Living arrangements for the past 2 months: [place] ?                ?DME Arranged: N/A ?DME Agency: NA ?  ?  ?  ?  ?[place] Agency: [facility] ?Date HH Agency Contacted: 03/16/22 ?Time [place]: [phone] ?Representative spoke with at [place]: Rema Jasmine ? ? ?Social Determinants of Health (SDOH) Interventions ?  ? ?Readmission Risk Interventions ?   ? View : No data to display.  ?  ?  ?  ? ? ?

## 2022-03-17 NOTE — TOC Progression Note (Addendum)
Transition of Care (TOC) - Progression Note  ? ? ?Patient Details  ?Name: Georgiann Neider. Brereton ?MRN: [id number] ?Date of Birth: 1946-06-10 ? ?Transition of Care (TOC) CM/SW Contact  ?[place], LCSWA ?Phone Number: ?03/17/2022, 10:35 AM ? ?Clinical Narrative:    ?  ?CSW spoke to patient's daughter Amy Vines(POA) at [phone]. She reported preferring [facility] CSW spoke to [place] at [place] [phone] place to confirm acceptance and inform them patient is ready for discharge today. Louie Casa reported CSW would need to speak to [place] from admissions and he would give her my contact information. CSW waiting to hear back.  ? ?TOC will continue to follow.  ? ? ?Expected Discharge Plan: [place] ?Barriers to Discharge: Continued Medical Work up ? ?Expected Discharge Plan and Services ?Expected Discharge Plan: [place] ?  ?Discharge Planning Services: CM Consult ?  ?Living arrangements for the past 2 months: [place] ?                ?DME Arranged: N/A ?DME Agency: NA ?  ?  ?  ?  ?[place] Agency: [facility] ?Date HH Agency Contacted: 03/16/22 ?Time [place]: [phone] ?Representative spoke with at [place]: Rema Jasmine ? ? ?Social Determinants of Health (SDOH) Interventions ?  ? ?Readmission Risk Interventions ?   ? View : No data to display.  ?  ?  ?  ? ? ?

## 2022-03-17 NOTE — Assessment & Plan Note (Signed)
Estimated body mass index is 19.27 kg/m? as calculated from the following: ?  Height as of this encounter: '5\' 2"'$  (1.575 m). ?  Weight as of this encounter: 47.8 kg.  ? ?-Dietitian consult ?

## 2022-03-17 NOTE — Progress Notes (Signed)
Speech Language Pathology Treatment: Dysphagia  ?Patient Details ?Name: Wendy [name] ?MRN: [id number] ?DOB: Jul 30, 1946 ?Today's Date: 03/17/2022 ?Time: 1325-1400 ?SLP Time Calculation (min) (ACUTE ONLY): 35 min ? ?Assessment / Plan / Recommendation ?Clinical Impression ? Pt seen for ongoing assessment of swallowing; toleration of thin liquids. She was alert, verbally responsive and engaged w/ SLP; continues w/ improved vocal quality and projection of voice today vs at evaluation. Pt was able to follow through w/ instructions given cues, encouragement. Noted mild phlegmy cough at Baseline x1 PRIOR to po's.  ?  ?Pt explained general aspiration precautions and agreed verbally to the need for following them especially sitting upright for all oral intake -- supported behind the back for full upright sitting. Also discussed the need to take SMALL, SINGLE sips of thin liquids via Cup SLOWLY -- pt agreed and demonstrated such give verbal cues as reminders.  ?She fed herself trials of thin liquids via Cup w/ No immediate, overt clinical s/s of aspiration noted w/ any consistency; respiratory status remained calm and unlabored, O2 sats 99%, vocal quality clear b/t trials, no cough. Pt exhibited decreased desire for po trials and required verbal/tactile encouragement. NSG reported a similar presentation w/ meals today; noted her urine was dark as well since stopping fluids. Oral phase revealed adequate bolus management w/ min prolonged A-P transfer for swallowing; oral clearing achieved w/ all trials. Mildly distracted intermittently but redirected to po tasks appropriately. ?  ?Pt's medical status appears to be improving overall as per consult w/ NSG. Pt appears at reduced risk for aspriation when following aspiration precautions and using a modified diet consistency initially to best support safe oral intake. Recommend continue a dysphagia level 1 (puree) w/ gravies added to moisten foods; Thin liquids VIA CUP. Recommend  aspiration precautions; Supervision at meals to monitor status; Pills CRUSHED in Puree; tray setup and positioning assistance for meals. ST services will continue to monitor for appropriateness to upgrade diet consistency next week. NSG updated. Precautions posted at bedside. Recommend a Palliative Care consult to discuss overall status/GOC in setting of pt's decreased oral intake. NSG agreed.  ? ? ?  ?HPI HPI: Pt is a 76 yo F presenting to [facility] ED from home via EMS on 03/10/22 with complaints of respiratory distress. Per ED documentation, EMS found the patient dyspneic and hypoxic with SpO2 88% on RA, though she was A&O x 4. She received duo-neb, solu medrol, Zofran & magnesium.  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 She reports since her mother switched to immunotherapy she had been doing better, no recent complaints of any kind. Of note, patient was on Eliquis for un-provoked bilateral DVT's until 02/22/22 when this medication was stopped.     ED course:  Upon arrival to ED, as EMS was wheeling her into triage she appeared to have an abrupt change in mental status: staring off into space, nonverbal/unable to follow commands/unresponsive except for eyes being open and blinking to threat. CODE STROKE was initiated. When the patient returned from the [facility] wo contrast her respiratory status declined and her mentation had not improved, EDP made the decision to emergently intubate the patient placing her on mechanical ventilatory support. Tele-neurology assessed the patient and imaging deeming the patient NOT a candidate for TNKase.  Even post intubation, pt was able to follow general commands and answer some questions per NSG notes.  Mitts and meds given d/t agitation.  EEG completed on  03/14/2022 suggestive of "moderate diffuse encephalopathy, nonspecific etiology. No seizures or definite epileptiform discharges were  seen throughout the recording.".   CXR: Persistent bilateral pleural effusions with hazy densities in the  right lower chest. Findings have minimally changed since 03/10/2022.  2. Prominent interstitial lung markings have minimally changed.  MRI: Multiple small acute infarcts in the high frontal and parietal  lobes bilaterally.  2. Chronic microvascular disease and atrophy. ?  ?   ?SLP Plan ? Continue with current plan of care ? ?  ?  ?Recommendations for follow up therapy are one component of a multi-disciplinary discharge planning process, led by the attending physician.  Recommendations may be updated based on patient status, additional functional criteria and insurance authorization. ?  ? ?Recommendations  ?Diet recommendations: Dysphagia 1 (puree);Thin liquid ?Liquids provided via: Cup;No straw ?Medication Administration: Crushed with puree ?Supervision: Patient able to self feed;Staff to assist with self feeding;Intermittent supervision to cue for compensatory strategies ?Compensations: Minimize environmental distractions;Slow rate;Small sips/bites;Lingual sweep for clearance of pocketing;Follow solids with liquid ?Postural Changes and/or Swallow Maneuvers: Out of bed for meals;Seated upright 90 degrees;Upright 30-60 min after meal  ?   ?    ?   ? ? ? ? General recommendations:  (Palliative Care consult; Dietician f/u) ?Oral Care Recommendations: Oral care BID;Oral care before and after PO;Staff/trained caregiver to provide oral care ?Follow Up Recommendations: Skilled nursing-short term rehab (<3 hours/day) (TBD) ?Assistance recommended at discharge: Frequent or constant Supervision/Assistance ?SLP Visit Diagnosis: Dysphagia, oropharyngeal phase (R13.12) (Cognitive impact) ?Plan: Continue with current plan of care ? ? ? ? ?  ?  ? ? ? ? ?Orinda Kenner, MS, CCC-SLP ?Speech Language Pathologist ?Rehab Services; [place] ?[phone] (ascom) ?Wendy [name] ? ?03/17/2022, 2:22 PM ?

## 2022-03-17 NOTE — Assessment & Plan Note (Signed)
Resolved.  Patient successfully extubated and currently saturating well on room air. ?-Continue to monitor ?

## 2022-03-17 NOTE — Progress Notes (Signed)
PT Cancellation Note ? ?Patient Details ?Name: Wendy [name]. Sherrer ?MRN: [id number] ?DOB: 30-Mar-1946 ? ? ?Cancelled Treatment:    Reason Eval/Treat Not Completed: Patient declined to participate with PT services this date secondary to fatigue.  Encouragement given with pt again declining.  Will attempt to see pt at a future date/time as medically appropriate.  ? ? ?D. Royetta Asal PT, DPT ?03/17/22, 3:14 PM ? ?

## 2022-03-17 NOTE — TOC Progression Note (Signed)
Transition of Care (TOC) - Progression Note  ? ? ?Patient Details  ?Name: Wendy [name] ?MRN: [id number] ?Date of Birth: 1946-04-22 ? ?Transition of Care (TOC) CM/SW Contact  ?[place], LCSWA ?Phone Number: ?03/17/2022, 2:37 PM ? ?Clinical Narrative:    ? ?CSW called [facility] to follow up and was told they have available beds but do not take new admissions over the weekend. Patient's daughter [name] has been informed.  ? ?TOC will follow up Monday for discharge. ? ? ?Expected Discharge Plan: [place] ?Barriers to Discharge: Continued Medical Work up ? ?Expected Discharge Plan and Services ?Expected Discharge Plan: [place] ?  ?Discharge Planning Services: CM Consult ?  ?Living arrangements for the past 2 months: [place] ?                ?DME Arranged: N/A ?DME Agency: NA ?  ?  ?  ?  ?[place] Agency: [facility] ?Date HH Agency Contacted: 03/16/22 ?Time [place]: 4163 ?Representative spoke with at [place]: Rema Jasmine ? ? ?Social Determinants of Health (SDOH) Interventions ?  ? ?Readmission Risk Interventions ?   ? View : No data to display.  ?  ?  ?  ? ? ?

## 2022-03-17 NOTE — Progress Notes (Signed)
?Progress Note ? ? ?Patient: Wendy [name]. [name] FBX:[id number] DOB: 1946/11/06 DOA: 03/10/2022     6 ?DOS: the patient was seen and examined on 03/17/2022 ?  ?Brief hospital course: ?ICU transfer. ? ?Taken from prior notes. ? ?76 yo F presenting to [facility] ED from home via EMS on 03/10/22 with complaints of respiratory distress. Per ED documentation, EMS found the patient dyspneic and hypoxic with SpO2 88% on RA, she was A&O x 4. She received duo-neb, solu medrol, Zofran & magnesium. 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 She reports since her mother switched to immunotherapy she had been doing better, no recent complaints of any kind. Of note, patient was on Eliquis for un-provoked bilateral DVT's until 02/22/22 when this medication was stopped. ? ?Upon arrival to ED, as EMS was wheeling her into triage she appeared to have an abrupt change in mental status: staring off into space, nonverbal/unable to follow commands/unresponsive except for eyes being open and blinking to threat. CODE STROKE was initiated. When the patient returned from the [facility] wo contrast her respiratory status declined and her mentation had not improved, EDP made the decision to emergently intubate the patient placing her on mechanical ventilatory support. Tele-neurology assessed the patient and imaging deeming the patient NOT a candidate for TNKase and there was no LVO. ? ?CXR 03/10/22: Progressive peribronchial thickening suggestive of pulmonary edema. Bilateral pleural effusions ?CT head wo contrast 03/10/22: no acute intracranial abnormality. ASPECTS 10. ?CTa head/neck 03/11/22: No emergent large vessel occlusion or hemodynamically significant stenosis of the intracranial or cervical arteries. 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 Medium-sized pleural effusions with innumerable bilateral pulmonary nodules, too many to count.  ?CT angio chest 03/11/22:  negative for PE. Enlarging bilateral pleural effusions, interval development of diffuse ground-glass pulmonary infiltrate most suggestive of alveolar pulmonary edema & development of diffuse body wall and retroperitoneal edema in keeping with anasarca. Moderate multi-vessel coronary artery calcification. Mild cardiomegaly with left ventricular hypertrophy. Grossly stable diffuse subcentimeter pulmonary nodularity. Interval development of a subacute manubrial fracture. Osteopenia. ? ?After emergent intubation, case was discussed with patient's daughter who confirmed DNR status but would like to continue with mechanical ventilation for another day and make decision after follow-up MRI.  MRI brain with multiple acute small ischemic infarcts involving bilateral frontal and parietal lobe. ? ?Neurology is on board.  No large vessel occlusion on CTA, echocardiogram with no cardiac source of embolization identified.  No PFO.  No LA appendage thrombus.  Patient was also noted to have transient hypotension which could have caused infarct in watershed area per neurology. ?Patient is without any significant neurologic deficit.  Neurology is recommending Plavix and statin.  No indication for anticoagulation due to no evidence of atrial fibrillation.  Antiphospholipid antibody syndrome was on differential-beta-2 glycoprotein labs and antilupus labs were within normal limit.  Cardiolipin antibodies are pending. ? ?Patient has EEG twice which shows moderate diffuse encephalopathy, she did show generalized periodic discharges with triphasic morphology which can be on the ictal interictal continuum with hypertension for seizures.  She was loaded with Keppra and started on twice daily dose.  Most likely seizures like episode with decreased responsiveness and hypotension.  She might not need Keppra lifelong but neurology is hesitant to take her off for the next few weeks to months.  She needs to have a close follow-up with neurology as  an outpatient to determine the further need. ? ?Patient  was successfully extubated on 03/15/2022.  Currently saturating well on room air. ?Passed swallow evaluation and started on diet.  Foley was removed. ?PT/OT ordered-recommending SNF ?Patient can be transferred out of ICU to [place]. ? ?Palliative care was also consulted to discuss goals of care as appetite remained poor. ?TOC consult for placement. ?Had a bed offer and should be able to go on Monday. ? ? ? ? ?Assessment and Plan: ?* Acute respiratory failure ([place]) ?Resolved.  Patient successfully extubated and currently saturating well on room air. ?-Continue to monitor ? ?Acute congestive heart failure ([place]) ?Echocardiogram with normal EF and mild left ventricular hypertrophy. ?Right ventricular dysfunction and dilatation. ?Appears euvolemic. ?-Continue to monitor ? ?Altered mental status ?Most likely with underlying multiple small cerebral infarcts. ?Resolved and now she appears at baseline. ?-Continue to monitor ? ?Acute CVA (cerebrovascular accident) ([place]) ?MRI concerning for multiple small infarcts involving parietal and frontal lobes bilaterally.  Concern of watershed infarcts with sudden hypotension.  No large vessel occlusion.  Echocardiogram was without any source of emboli and no PFO. ?Passed swallow screen. ?Neurology is on board. ?-Continue aspirin, Plavix and Lipitor. ?-Patient will need outpatient neurology follow-up ?-PT/OT evaluation ? ?Seizure ([place]) ?Concern of seizure on first EEG.  Can be due to brain anoxia with hypotension. ?No history of prior seizures. ?She was loaded with Keppra followed by twice daily Keppra dose. ?Per neurology she might not need lifelong Keppra but they are recommending to continue for next few weeks to months.  She will need outpatient neurology follow-up for further recommendations. ?-Continue twice daily Keppra-switch to p.o. ? ?Multiple myeloma ([place]) ?- Outpatient follow-up ? ?Essential hypertension ?Blood pressure  elevated. ?-Restart home dose of metoprolol ?-As needed hydralazine for systolic above 621 ? ?Protein-calorie malnutrition, severe ?Estimated body mass index is 19.27 kg/m? as calculated from the following: ?  Height as of this encounter: '5\' 2"'  (1.575 m). ?  Weight as of this encounter: 47.8 kg.  ? ?-Dietitian consult ? ?  ? ?Subjective: Patient was seen and examined today.  She was sitting in front of the breakfast tray. ?Appetite remains poor.  No other complaints. ? ?Physical Exam: ?Vitals:  ? 03/17/22 0600 03/17/22 0620 03/17/22 0731 03/17/22 0800  ?BP:  (!) 155/78  (!) 163/84  ?Pulse: 91 82  85  ?Resp: (!) 26 (!) 25  (!) 25  ?Temp:   98 ?F (36.7 ?C)   ?TempSrc:   Oral   ?SpO2:    99%  ?Weight:      ?Height:      ? ?General.  Malnourished elderly lady, in no acute distress. ?Pulmonary.  Lungs clear bilaterally, normal respiratory effort. ?CV.  Regular rate and rhythm, no JVD, rub or murmur. ?Abdomen.  Soft, nontender, nondistended, BS positive. ?CNS.  Alert and oriented .  No focal neurologic deficit. ?Extremities.  No edema, no cyanosis, pulses intact and symmetrical. ?Psychiatry.  Judgment and insight appears normal. ? ?Data Reviewed: ?Prior notes and labs reviewed. ? ?Family Communication: Discuss with daughter on phone. ? ?Disposition: ?Status is: Inpatient ?Remains inpatient appropriate because: Severity of illness ? ? Planned Discharge Destination: Skilled nursing facility ? ?DVT Prophylaxis. Lovenox ? ?Time spent: 45 minutes ? ?This record has been created using Systems analyst. Errors have been sought and corrected,but may not always be located. Such creation errors do not reflect on the standard of care. ? ?Author: ?Lorella Nimrod, MD ?03/17/2022 2:33 PM ? ?For on call review [email or web address].  ?

## 2022-03-17 NOTE — Progress Notes (Signed)
Patient transferred to room 107. Patients daughter notified. Patient was questioning where her phone and purse were, daughter confirmed that she took them with her.  ?

## 2022-03-18 DIAGNOSIS — I509 Heart failure, unspecified: Secondary | ICD-10-CM | POA: Diagnosis not present

## 2022-03-18 DIAGNOSIS — J9601 Acute respiratory failure with hypoxia: Secondary | ICD-10-CM | POA: Diagnosis not present

## 2022-03-18 LAB — GLUCOSE, CAPILLARY
Glucose-Capillary: 77 mg/dL (ref 70–99)
Glucose-Capillary: 87 mg/dL (ref 70–99)
Glucose-Capillary: 62 mg/dL — ABNORMAL LOW (ref 70–99)
Glucose-Capillary: 158 mg/dL — ABNORMAL HIGH (ref 70–99)
Glucose-Capillary: 54 mg/dL — ABNORMAL LOW (ref 70–99)
Glucose-Capillary: 78 mg/dL (ref 70–99)

## 2022-03-18 MED ORDER — AMLODIPINE BESYLATE 5 MG PO TABS
5.0000 mg | ORAL_TABLET | Freq: Every day | ORAL | Status: DC
  Administered 2022-03-18 – 2022-03-19 (×2): 5 mg via ORAL
  Filled 2022-03-18 (×2): qty 1

## 2022-03-18 MED ORDER — PANTOPRAZOLE 2 MG/ML SUSPENSION
40.0000 mg | Freq: Every day | ORAL | Status: DC
  Administered 2022-03-18 – 2022-03-19 (×2): 40 mg via ORAL
  Filled 2022-03-18 (×2): qty 20

## 2022-03-18 MED ORDER — LEVETIRACETAM 100 MG/ML PO SOLN
500.0000 mg | Freq: Two times a day (BID) | ORAL | Status: DC
Start: 2022-03-18 — End: 2022-03-19
  Administered 2022-03-18 – 2022-03-19 (×3): 500 mg via ORAL
  Filled 2022-03-18 (×3): qty 5

## 2022-03-18 MED ORDER — METOPROLOL TARTRATE 25 MG PO TABS
25.0000 mg | ORAL_TABLET | Freq: Two times a day (BID) | ORAL | Status: DC
  Administered 2022-03-18 – 2022-03-19 (×3): 25 mg via ORAL
  Filled 2022-03-18 (×3): qty 1

## 2022-03-18 NOTE — Progress Notes (Signed)
Patients BP 180/90, Hydralazine order PRN for BP over 160. Patient refuses to take hydralazine, wishes to speak to a doctor. Dr Reesa Chew notified.  ?

## 2022-03-18 NOTE — Assessment & Plan Note (Addendum)
Blood pressure remained elevated. ?-Increase the home dose of metoprolol from 12.5 to 25 mg twice daily. ?-Added low-dose amlodipine at 5 mg daily ?-As needed hydralazine for systolic above 158 ?

## 2022-03-18 NOTE — Progress Notes (Signed)
?Progress Note ? ? ?Patient: Wendy [name]. [name] QGB:[id number] DOB: 05/03/1946 DOA: 03/10/2022     7 ?DOS: the patient was seen and examined on 03/18/2022 ?  ?Brief hospital course: ?ICU transfer. ? ?Taken from prior notes. ? ?76 yo F presenting to [facility] ED from home via EMS on 03/10/22 with complaints of respiratory distress. Per ED documentation, EMS found the patient dyspneic and hypoxic with SpO2 88% on RA, she was A&O x 4. She received duo-neb, solu medrol, Zofran & magnesium. 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 She reports since her mother switched to immunotherapy she had been doing better, no recent complaints of any kind. Of note, patient was on Eliquis for un-provoked bilateral DVT's until 02/22/22 when this medication was stopped. ? ?Upon arrival to ED, as EMS was wheeling her into triage she appeared to have an abrupt change in mental status: staring off into space, nonverbal/unable to follow commands/unresponsive except for eyes being open and blinking to threat. CODE STROKE was initiated. When the patient returned from the [facility] wo contrast her respiratory status declined and her mentation had not improved, EDP made the decision to emergently intubate the patient placing her on mechanical ventilatory support. Tele-neurology assessed the patient and imaging deeming the patient NOT a candidate for TNKase and there was no LVO. ? ?CXR 03/10/22: Progressive peribronchial thickening suggestive of pulmonary edema. Bilateral pleural effusions ?CT head wo contrast 03/10/22: no acute intracranial abnormality. ASPECTS 10. ?CTa head/neck 03/11/22: No emergent large vessel occlusion or hemodynamically significant stenosis of the intracranial or cervical arteries. 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 Medium-sized pleural effusions with innumerable bilateral pulmonary nodules, too many to count.  ?CT angio chest 03/11/22:  negative for PE. Enlarging bilateral pleural effusions, interval development of diffuse ground-glass pulmonary infiltrate most suggestive of alveolar pulmonary edema & development of diffuse body wall and retroperitoneal edema in keeping with anasarca. Moderate multi-vessel coronary artery calcification. Mild cardiomegaly with left ventricular hypertrophy. Grossly stable diffuse subcentimeter pulmonary nodularity. Interval development of a subacute manubrial fracture. Osteopenia. ? ?After emergent intubation, case was discussed with patient's daughter who confirmed DNR status but would like to continue with mechanical ventilation for another day and make decision after follow-up MRI.  MRI brain with multiple acute small ischemic infarcts involving bilateral frontal and parietal lobe. ? ?Neurology is on board.  No large vessel occlusion on CTA, echocardiogram with no cardiac source of embolization identified.  No PFO.  No LA appendage thrombus.  Patient was also noted to have transient hypotension which could have caused infarct in watershed area per neurology. ?Patient is without any significant neurologic deficit.  Neurology is recommending Plavix and statin.  No indication for anticoagulation due to no evidence of atrial fibrillation.  Antiphospholipid antibody syndrome was on differential-beta-2 glycoprotein labs and antilupus labs were within normal limit.  Cardiolipin antibodies are pending. ? ?Patient has EEG twice which shows moderate diffuse encephalopathy, she did show generalized periodic discharges with triphasic morphology which can be on the ictal interictal continuum with hypertension for seizures.  She was loaded with Keppra and started on twice daily dose.  Most likely seizures like episode with decreased responsiveness and hypotension.  She might not need Keppra lifelong but neurology is hesitant to take her off for the next few weeks to months.  She needs to have a close follow-up with neurology as  an outpatient to determine the further need. ? ?Patient  was successfully extubated on 03/15/2022.  Currently saturating well on room air. ?Passed swallow evaluation and started on diet.  Foley was removed. ?PT/OT ordered-recommending SNF ?Patient can be transferred out of ICU to [place]. ? ?Palliative care was also consulted to discuss goals of care as appetite remained poor. ?TOC consult for placement. ?Had a bed offer and should be able to go on Monday. ? ? ? ? ?Assessment and Plan: ?* Acute respiratory failure ([place]) ?Resolved.  Patient successfully extubated and currently saturating well on room air. ?-Continue to monitor ? ?Acute congestive heart failure ([place]) ?Echocardiogram with normal EF and mild left ventricular hypertrophy. ?Right ventricular dysfunction and dilatation. ?Appears euvolemic. ?-Continue to monitor ? ?Altered mental status ?Most likely with underlying multiple small cerebral infarcts. ?Resolved and now she appears at baseline. ?-Continue to monitor ? ?Acute CVA (cerebrovascular accident) ([place]) ?MRI concerning for multiple small infarcts involving parietal and frontal lobes bilaterally.  Concern of watershed infarcts with sudden hypotension.  No large vessel occlusion.  Echocardiogram was without any source of emboli and no PFO. ?Passed swallow screen. ?Neurology is on board. ?-Continue aspirin, Plavix and Lipitor. ?-Patient will need outpatient neurology follow-up ?-PT/OT evaluation ? ?Seizure ([place]) ?Concern of seizure on first EEG.  Can be due to brain anoxia with hypotension. ?No history of prior seizures. ?She was loaded with Keppra followed by twice daily Keppra dose. ?Per neurology she might not need lifelong Keppra but they are recommending to continue for next few weeks to months.  She will need outpatient neurology follow-up for further recommendations. ?-Continue twice daily Keppra-switch to p.o. ? ?Multiple myeloma ([place]) ?- Outpatient follow-up ? ?Essential hypertension ?Blood pressure  remained elevated. ?-Increase the home dose of metoprolol from 12.5 to 25 mg twice daily. ?-Added low-dose amlodipine at 5 mg daily ?-As needed hydralazine for systolic above 741 ? ?Protein-calorie malnutrition, severe ?Estimated body mass index is 19.27 kg/m? as calculated from the following: ?  Height as of this encounter: '5\' 2"'  (1.575 m). ?  Weight as of this encounter: 47.8 kg.  ? ?-Dietitian consult ? ?  ? ?Subjective: Patient was seen and examined today.  She ate about half of her breakfast.  Denies any complaints. ? ?Physical Exam: ?Vitals:  ? 03/18/22 0500 03/18/22 0838 03/18/22 1154 03/18/22 1559  ?BP:  (!) 180/90 (!) 154/86 (!) 158/84  ?Pulse:  84 79 83  ?Resp:  '16 17 16  ' ?Temp:  97.9 ?F (36.6 ?C) 98.2 ?F (36.8 ?C) 98.2 ?F (36.8 ?C)  ?TempSrc:  Oral Oral Oral  ?SpO2:  98% 99% 95%  ?Weight: 50.8 kg     ?Height:      ? ?General.  Frail and malnourished elderly lady, in no acute distress. ?Pulmonary.  Lungs clear bilaterally, normal respiratory effort. ?CV.  Regular rate and rhythm, no JVD, rub or murmur. ?Abdomen.  Soft, nontender, nondistended, BS positive. ?CNS.  Alert and oriented .  No focal neurologic deficit. ?Extremities.  No edema, no cyanosis, pulses intact and symmetrical.  Multiple ecchymosis on bilateral upper extremities ?Psychiatry.  Judgment and insight appears normal. ? ?Data Reviewed: ?Prior notes and labs reviewed ? ?Family Communication:  ? ?Disposition: ?Status is: Inpatient ?Remains inpatient appropriate because: Severity of illness ? ? Planned Discharge Destination: Skilled nursing facility ? ?DVT prophylaxis.  Lovenox ? ?Time spent: 42 minutes ? ?This record has been created using Systems analyst. Errors have been sought and corrected,but may not always be located. Such creation errors do not reflect on the standard of  care. ? ?Author: ?Lorella Nimrod, MD ?03/18/2022 5:14 PM ? ?For on call review [email or web address].  ?

## 2022-03-18 NOTE — Progress Notes (Signed)
Physical Therapy Treatment ?Patient Details ?Name: Wendy [name] ?MRN: [id number] ?DOB: 1945-12-31 ?Today's Date: 03/18/2022 ? ? ?History of Present Illness Pt is a 76 yo F with PMH that includes: multiple myeloma, HTN, hep-A, osteoporosis, CHF, DVTs, and CAD.  Pt presenting to [facility] ED from home via EMS on 03/10/22 with complaints of respiratory distress. MD assessment includes: Acute respiratory failure with pt intubated now extubated, AMS, acute CVA including multiple small infarcts involving parietal and frontal lobes bilaterally, concern of seizure activity, and severe protein-calorie malnutrition. ? ?  ?PT Comments  ? ? Pt was pleasant and motivated to participate during the session and put forth good effort throughout. Pt continued to require extensive assist, often +2, to safely perform functional tasks. Pt presented with posterior instability in both sitting and standing and was only able to take a max of 2-3 very small, shuffling steps at the EOB. No adverse symptoms reported during the session with SpO2 and HR WNL.  Pt will benefit from PT services in a SNF setting upon discharge to safely address deficits listed in patient problem list for decreased caregiver assistance and eventual return to PLOF. ?    ?   ?Recommendations for follow up therapy are one component of a multi-disciplinary discharge planning process, led by the attending physician.  Recommendations may be updated based on patient status, additional functional criteria and insurance authorization. ? ?Follow Up Recommendations ? Skilled nursing-short term rehab (<3 hours/day) ?  ?  ?Assistance Recommended at Discharge Frequent or constant Supervision/Assistance  ?Patient can return home with the following Two people to help with walking and/or transfers;Two people to help with bathing/dressing/bathroom;Assistance with cooking/housework;Direct supervision/assist for financial management;Assist for transportation;Help with stairs or ramp for  entrance ?  ?Equipment Recommendations ? None recommended by PT  ?  ?Recommendations for Other Services   ? ? ?  ?Precautions / Restrictions Precautions ?Precautions: Fall ?Restrictions ?Weight Bearing Restrictions: No  ?  ? ?Mobility ? Bed Mobility ?Overal bed mobility: Needs Assistance ?Bed Mobility: Supine to Sit, Sit to Supine ?  ?  ?Supine to sit: Mod assist ?Sit to supine: Mod assist ?  ?General bed mobility comments: Mod A for BLE and trunk control ?  ? ?Transfers ?Overall transfer level: Needs assistance ?Equipment used: Rolling walker (2 wheels) ?Transfers: Sit to/from Stand ?Sit to Stand: Mod assist, +2 physical assistance, From elevated surface ?  ?  ?  ?  ?  ?General transfer comment: Assist to come to standing and to prevent posterior LOB while in standing ?  ? ?Ambulation/Gait ?Ambulation/Gait assistance: Mod assist ?Gait Distance (Feet): 1 Feet ?Assistive device: Rolling walker (2 wheels) ?Gait Pattern/deviations: Trunk flexed, Step-to pattern, Decreased step length - right, Decreased step length - left, Shuffle ?Gait velocity: decreased ?  ?  ?General Gait Details: Pt able to take several very small, shuffling steps at the EOB with Mod A for stability and to advance the RW; pt required heavy cuing for sequencing to initiate movement ? ? ?Stairs ?  ?  ?  ?  ?  ? ? ?Wheelchair Mobility ?  ? ?Modified Rankin (Stroke Patients Only) ?  ? ? ?  ?Balance Overall balance assessment: Needs assistance ?Sitting-balance support: Feet supported, Single extremity supported ?Sitting balance-Leahy Scale: Poor ?Sitting balance - Comments: Generally poor trunk control in sitting with heavy cuing needed for upright posture and anterior weight shift to prevent posterior LOB ?  ?Standing balance support: Bilateral upper extremity supported, During functional activity ?Standing balance-Leahy Scale: Poor ?  Standing balance comment: frequent Mod A in standing for stability ?  ?  ?  ?  ?  ?  ?  ?  ?  ?  ?  ?  ? ?  ?Cognition  Arousal/Alertness: Awake/alert ?Behavior During Therapy: [facility] for tasks assessed/performed ?Overall Cognitive Status: Within Functional Limits for tasks assessed ?  ?  ?  ?  ?  ?  ?  ?  ?  ?  ?  ?  ?  ?  ?  ?  ?  ?  ?  ? ?  ?Exercises Total Joint Exercises ?Ankle Circles/Pumps: AROM, Strengthening, Both, 10 reps, 5 reps ?Quad Sets: Strengthening, Both, 5 reps, 10 reps ?Gluteal Sets: Strengthening, Both, 10 reps, 5 reps ?Heel Slides: AAROM, Strengthening, Both, 5 reps ?Hip ABduction/ADduction: AAROM, Strengthening, Both, 10 reps ?Straight Leg Raises: AAROM, Strengthening, Both, 10 reps ?Long Arc Quad: AROM, Strengthening, Both, 10 reps ?Knee Flexion: AROM, Strengthening, Both, 10 reps ?Bridges: Strengthening, Both, 5 reps (low amplitude) ?Other Exercises: anterior weight shifting activities in sitting to address posterior LOB ?Other Exercises: HEP education review for BLE APs, QS, and GS x 10 each every 1-2 hours daily ? ?  ?General Comments   ?  ?  ? ?Pertinent Vitals/Pain Pain Assessment ?Pain Assessment: No/denies pain  ? ? ?Home Living   ?  ?  ?  ?  ?  ?  ?  ?  ?  ?   ?  ?Prior Function    ?  ?  ?   ? ?PT Goals (current goals can now be found in the care plan section) Progress towards PT goals: PT to reassess next treatment ? ?  ?Frequency ? ? ? 7X/week ? ? ? ?  ?PT Plan Current plan remains appropriate  ? ? ?Co-evaluation   ?  ?  ?  ?  ? ?  ?AM-PAC PT "6 Clicks" Mobility   ?Outcome Measure ? Help needed turning from your back to your side while in a flat bed without using bedrails?: A Lot ?Help needed moving from lying on your back to sitting on the side of a flat bed without using bedrails?: A Lot ?Help needed moving to and from a bed to a chair (including a wheelchair)?: A Lot ?Help needed standing up from a chair using your arms (e.g., wheelchair or bedside chair)?: Total ?Help needed to walk in hospital room?: Total ?Help needed climbing 3-5 steps with a railing? : Total ?6 Click Score: 9 ? ?  ?End of Session  Equipment Utilized During Treatment: Gait belt ?Activity Tolerance: Patient tolerated treatment well ?Patient left: in bed;with call bell/phone within reach;with bed alarm set ?Nurse Communication: Mobility status ?PT Visit Diagnosis: Unsteadiness on feet (R26.81);History of falling (Z91.81);Muscle weakness (generalized) (M62.81);Difficulty in walking, not elsewhere classified (R26.2) ?  ? ? ?Time: 9969-2493 ?PT Time Calculation (min) (ACUTE ONLY): 41 min ? ?Charges:  $Therapeutic Exercise: 23-37 mins ?$Therapeutic Activity: 8-22 mins          ?          ? ?D. Royetta Asal PT, DPT ?03/18/22, 2:52 PM ? ? ?

## 2022-03-19 ENCOUNTER — Telehealth: Payer: Medicare Other

## 2022-03-19 ENCOUNTER — Other Ambulatory Visit: Payer: Self-pay | Admitting: Nurse Practitioner

## 2022-03-19 DIAGNOSIS — C9000 Multiple myeloma not having achieved remission: Secondary | ICD-10-CM

## 2022-03-19 DIAGNOSIS — R4182 Altered mental status, unspecified: Secondary | ICD-10-CM | POA: Diagnosis not present

## 2022-03-19 DIAGNOSIS — J9601 Acute respiratory failure with hypoxia: Secondary | ICD-10-CM | POA: Diagnosis not present

## 2022-03-19 DIAGNOSIS — Z66 Do not resuscitate: Secondary | ICD-10-CM

## 2022-03-19 DIAGNOSIS — Z515 Encounter for palliative care: Secondary | ICD-10-CM

## 2022-03-19 DIAGNOSIS — I509 Heart failure, unspecified: Secondary | ICD-10-CM | POA: Diagnosis not present

## 2022-03-19 DIAGNOSIS — R569 Unspecified convulsions: Secondary | ICD-10-CM

## 2022-03-19 DIAGNOSIS — I10 Essential (primary) hypertension: Secondary | ICD-10-CM

## 2022-03-19 DIAGNOSIS — I639 Cerebral infarction, unspecified: Secondary | ICD-10-CM | POA: Diagnosis not present

## 2022-03-19 LAB — CBC
HCT: 38.7 % (ref 36.0–46.0)
Hemoglobin: 12.6 g/dL (ref 12.0–15.0)
MCH: 30.4 pg (ref 26.0–34.0)
MCHC: 32.6 g/dL (ref 30.0–36.0)
MCV: 93.3 fL (ref 80.0–100.0)
Platelets: 448 10*3/uL — ABNORMAL HIGH (ref 150–400)
RBC: 4.15 MIL/uL (ref 3.87–5.11)
RDW: 14.3 % (ref 11.5–15.5)
WBC: 6 10*3/uL (ref 4.0–10.5)
nRBC: 0 % (ref 0.0–0.2)

## 2022-03-19 LAB — BASIC METABOLIC PANEL
Anion gap: 11 (ref 5–15)
BUN: 26 mg/dL — ABNORMAL HIGH (ref 8–23)
CO2: 20 mmol/L — ABNORMAL LOW (ref 22–32)
Calcium: 8.3 mg/dL — ABNORMAL LOW (ref 8.9–10.3)
Chloride: 102 mmol/L (ref 98–111)
Creatinine, Ser: 0.87 mg/dL (ref 0.44–1.00)
GFR, Estimated: >60 mL/min (ref >60)
Glucose, Bld: 108 mg/dL — ABNORMAL HIGH (ref 70–99)
Potassium: 4 mmol/L (ref 3.5–5.1)
Sodium: 133 mmol/L — ABNORMAL LOW (ref 135–145)

## 2022-03-19 LAB — GLUCOSE, CAPILLARY
Glucose-Capillary: 100 mg/dL — ABNORMAL HIGH (ref 70–99)
Glucose-Capillary: 124 mg/dL — ABNORMAL HIGH (ref 70–99)

## 2022-03-19 MED ORDER — METOPROLOL TARTRATE 25 MG PO TABS: 25.0000 mg | ORAL_TABLET | Freq: Two times a day (BID) | ORAL | Status: DC

## 2022-03-19 MED ORDER — ADULT MULTIVITAMIN W/MINERALS CH: 1.0000 | ORAL_TABLET | Freq: Every day | ORAL | Status: DC

## 2022-03-19 MED ORDER — ATORVASTATIN CALCIUM 40 MG PO TABS: 40.0000 mg | ORAL_TABLET | Freq: Every day | ORAL | Status: AC

## 2022-03-19 MED ORDER — POLYETHYLENE GLYCOL 3350 17 G PO PACK: 17.0000 g | PACK | Freq: Every day | ORAL | 0 refills | Status: DC

## 2022-03-19 MED ORDER — DOCUSATE SODIUM 100 MG PO CAPS: 100.0000 mg | ORAL_CAPSULE | Freq: Two times a day (BID) | ORAL | 0 refills | Status: AC

## 2022-03-19 MED ORDER — LEVETIRACETAM 100 MG/ML PO SOLN
500.0000 mg | Freq: Two times a day (BID) | ORAL | 12 refills | Status: AC
Start: 2022-03-19 — End: ?

## 2022-03-19 MED ORDER — ZINC SULFATE 220 (50 ZN) MG PO CAPS: 220.0000 mg | ORAL_CAPSULE | Freq: Every day | ORAL | Status: AC

## 2022-03-19 MED ORDER — AMLODIPINE BESYLATE 5 MG PO TABS: 5.0000 mg | ORAL_TABLET | Freq: Every day | ORAL | Status: DC

## 2022-03-19 MED ORDER — ASPIRIN 81 MG PO CHEW: 81.0000 mg | CHEWABLE_TABLET | Freq: Every day | ORAL | Status: AC

## 2022-03-19 MED ORDER — CLOPIDOGREL BISULFATE 75 MG PO TABS: 75.0000 mg | ORAL_TABLET | Freq: Every day | ORAL | Status: AC

## 2022-03-19 MED ORDER — CYANOCOBALAMIN 1000 MCG PO TABS: 1000.0000 ug | ORAL_TABLET | Freq: Every day | ORAL | Status: AC

## 2022-03-19 MED ORDER — ASCORBIC ACID 500 MG PO TABS: 500.0000 mg | ORAL_TABLET | Freq: Two times a day (BID) | ORAL | Status: AC

## 2022-03-19 MED ORDER — PANTOPRAZOLE SODIUM 40 MG PO PACK: 40.0000 mg | PACK | Freq: Every day | ORAL | Status: AC

## 2022-03-19 NOTE — Discharge Summary (Signed)
?Physician Discharge Summary ?  ?Patient: Wendy [name]. [name] MRN: [id number] DOB: [birth year]  ?Admit date:     03/10/2022  ?Discharge date: 03/19/22  ?Discharge Physician: Lorella Nimrod  ? ?PCP: Abner Greenspan, MD  ? ?Recommendations at discharge:  ?1.  Please check CBC and BMP in 1 week ?2. continue aspirin and Plavix together for 1 month then followed by Plavix only. ?3.  Follow-up with outpatient neurology within a month. ?4.  Patient will continue [place] until her neurologist visit and they can determine the further need. ?5.  Encourage p.o. intake including hydration. ?6.  Supplement diet with Ensure or boost ?7.  Follow-up with primary care provider for further recommendations. ?8.  Follow-up with oncology for her multiple myeloma ?9.  Patient has very fragile skin, need very extra care for dressing removal.  Multiple ecchymosis involving upper extremities. ? ?Discharge Diagnoses: ?Principal Problem: ?  Acute respiratory failure ([place]) ?Active Problems: ?  Acute congestive heart failure ([place]) ?  Altered mental status ?  Acute CVA (cerebrovascular accident) ([place]) ?  Seizure ([place]) ?  Essential hypertension ?  Multiple myeloma ([place]) ?  Protein-calorie malnutrition, severe ? ?Hospital Course: ?ICU transfer. ? ?Taken from prior notes. ? ?76 yo F presenting to [facility] ED from home via EMS on 03/10/22 with complaints of respiratory distress. Per ED documentation, EMS found the patient dyspneic and hypoxic with SpO2 88% on RA, she was A&O x 4. She received duo-neb, solu medrol, Zofran & magnesium. 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 She reports since her mother switched to immunotherapy she had been doing better, no recent complaints of any kind. Of note, patient was on Eliquis for un-provoked bilateral DVT's until 02/22/22 when this medication was stopped. ? ?Upon arrival to ED, as EMS was wheeling her into triage she appeared  to have an abrupt change in mental status: staring off into space, nonverbal/unable to follow commands/unresponsive except for eyes being open and blinking to threat. CODE STROKE was initiated. When the patient returned from the [facility] wo contrast her respiratory status declined and her mentation had not improved, EDP made the decision to emergently intubate the patient placing her on mechanical ventilatory support. Tele-neurology assessed the patient and imaging deeming the patient NOT a candidate for TNKase and there was no LVO. ? ?CXR 03/10/22: Progressive peribronchial thickening suggestive of pulmonary edema. Bilateral pleural effusions ?CT head wo contrast 03/10/22: no acute intracranial abnormality. ASPECTS 10. ?CTa head/neck 03/11/22: No emergent large vessel occlusion or hemodynamically significant stenosis of the intracranial or cervical arteries. 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 Medium-sized pleural effusions with innumerable bilateral pulmonary nodules, too many to count.  ?CT angio chest 03/11/22: negative for PE. Enlarging bilateral pleural effusions, interval development of diffuse ground-glass pulmonary infiltrate most suggestive of alveolar pulmonary edema & development of diffuse body wall and retroperitoneal edema in keeping with anasarca. Moderate multi-vessel coronary artery calcification. Mild cardiomegaly with left ventricular hypertrophy. Grossly stable diffuse subcentimeter pulmonary nodularity. Interval development of a subacute manubrial fracture. Osteopenia. ? ?After emergent intubation, case was discussed with patient's daughter who confirmed DNR status but would like to continue with mechanical ventilation for another day and make decision after follow-up MRI.  MRI brain with multiple acute small ischemic infarcts involving bilateral frontal and parietal lobe. ? ?Neurology is on board.  No large vessel occlusion on CTA, echocardiogram with no cardiac source of embolization identified.   No  PFO.  No LA appendage thrombus.  Patient was also noted to have transient hypotension which could have caused infarct in watershed area per neurology. ?Patient is without any significant neurologic deficit.  Neurology is recommending Plavix and statin.  No indication for anticoagulation due to no evidence of atrial fibrillation.  Antiphospholipid antibody syndrome was on differential-beta-2 glycoprotein labs and antilupus labs were within normal limit.  Cardiolipin antibodies are pending. ? ?Patient has EEG twice which shows moderate diffuse encephalopathy, she did show generalized periodic discharges with triphasic morphology which can be on the ictal interictal continuum with hypertension for seizures.  She was loaded with Keppra and started on twice daily dose.  Most likely seizures like episode with decreased responsiveness and hypotension.  She might not need Keppra lifelong but neurology is hesitant to take her off for the next few weeks to months.  She needs to have a close follow-up with neurology as an outpatient to determine the further need. ? ?Patient was successfully extubated on 03/15/2022.  Currently saturating well on room air. ?Passed swallow evaluation and started on diet.  Foley was removed. ?PT/OT ordered-recommending SNF ?Patient was transferred to [place]. ? ?Palliative care was also consulted to discuss goals of care as appetite remained poor. ? ?Remained stable for discharge to rehab.  Patient was started on multiple new medications which include amlodipine 5 mg daily and increased dose of metoprolol to 25 mg twice daily for persistently elevated blood pressure, Keppra for concern of seizure-like activity and she should continue taking Keppra until she will sees a neurologist as an outpatient and they can determine the further need.  She cannot drive until that time. ? ?For stroke she will stay on aspirin and Plavix together for 1 month, followed by Plavix only. ? ?Patient also had some  constipation, please continue bowel regimen and back off if she develops diarrhea. ? ?Please supplement her diet with Ensure or boost. ? ?She will continue current medications as directed and follow-up with her providers. ? ? ? ?Assessment and Plan: ?* Acute respiratory failure ([place]) ?Resolved.  Patient successfully extubated and currently saturating well on room air. ?-Continue to monitor ? ?Acute congestive heart failure ([place]) ?Echocardiogram with normal EF and mild left ventricular hypertrophy. ?Right ventricular dysfunction and dilatation. ?Appears euvolemic. ?-Continue to monitor ? ?Altered mental status ?Most likely with underlying multiple small cerebral infarcts. ?Resolved and now she appears at baseline. ?-Continue to monitor ? ?Acute CVA (cerebrovascular accident) ([place]) ?MRI concerning for multiple small infarcts involving parietal and frontal lobes bilaterally.  Concern of watershed infarcts with sudden hypotension.  No large vessel occlusion.  Echocardiogram was without any source of emboli and no PFO. ?Passed swallow screen. ?Neurology is on board. ?-Continue aspirin, Plavix and Lipitor. ?-Patient will need outpatient neurology follow-up ?-PT/OT evaluation ? ?Seizure ([place]) ?Concern of seizure on first EEG.  Can be due to brain anoxia with hypotension. ?No history of prior seizures. ?She was loaded with Keppra followed by twice daily Keppra dose. ?Per neurology she might not need lifelong Keppra but they are recommending to continue for next few weeks to months.  She will need outpatient neurology follow-up for further recommendations. ?-Continue twice daily Keppra-switch to p.o. ? ?Multiple myeloma ([place]) ?- Outpatient follow-up ? ?Essential hypertension ?Blood pressure remained elevated. ?-Increase the home dose of metoprolol from 12.5 to 25 mg twice daily. ?-Added low-dose amlodipine at 5 mg daily ?-As needed hydralazine for systolic above 355 ? ?Protein-calorie malnutrition, severe ?Estimated body  mass index is 19.27  kg/m? as calculated from the following: ?  Height as of this encounter: '5\' 2"'  (1.575 m). ?  Weight as of this encounter: 47.8 kg.  ? ?-Dietitian consult ? ? ? ? ?Consultants: PCCM, neu

## 2022-03-19 NOTE — Consult Note (Signed)
? ?                                                                                ?Consultation Note ?Date: 03/19/2022  ? ?Patient Name: Wendy [name]  ?DOB: October 22, 1946  MRN: [id number]  Age / Sex: 76 y.o., female  ?PCP: Abner Greenspan, MD ?Referring Physician: Lorella Nimrod, MD ? ?Reason for Consultation: Establishing goals of care ? ?HPI/Patient Profile: 76 y.o. female  with past medical history of HTN, malnutrition, and multiple myeloma (did not do well on chemo, switched to immunotherapy) admitted on 03/10/2022 with acute respiratory failure.  Patient was being moved to triage and emergency room for treatment dyspnea and hypoxia when she had an abrupt change in mental status.  Code stroke was initiated.  CT without contrast showed no acute intracranial abnormalities.  During this time, patient's respiratory status declined and patient was emergently intubated in the emergency department.   ? 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 ? ?On 3/19 CT angiogram of the chest was negative for PE but revealed enlarging bilateral pleural effusions and groundglass pulmonary infiltrates suggestive of a villain pulmonary edema and likely anasarca. ? ?On 3/23, patient was successfully extubated to room air.  She passed her swallow evaluation and was resumed on a diet.  Foley removed without complication.  Evaluation of PT and OT recommending skilled nursing facility. ? ?Plan is set for patient to discharge to [facility] for rehab today.  DNR remains. Patient will continue outpatient treatments at [facility].  ? ?Clinical Assessment and Goals of Care: ?I have reviewed medical records including EPIC notes, labs and imaging, assessed the patient and then met with patient at bedside to discuss diagnosis prognosis, GOC, disposition and options. ? ?I introduced Palliative Medicine as  specialized medical care for people living with serious illness. It focuses on providing relief from the symptoms and stress of a serious illness. The goal is to improve quality of life for both the patient and the family. ? ?We discussed a brief life review of the patient.  Patient was married and has 1 daughter Amy who lives nearby in [place].  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  She is widowed and lives in [place] near her daughter so that she can often visit with her twin 11 year old grandson. ? ?As far as functional and nutritional status patient jokingly shares that prior to admission it was not going well.  She reports she does not have an appetite and everyone is always telling her to eat too much and too often.  She says she does but she can that she does not want to consume the amount of food that everyone is asking of her.  She endorses she was doing well as far as completing her ADLs independently without complication prior to admission. ? ?We discussed patient's current illness and what it means in the larger context of patient's on-going co-morbidities. I attempted to elicit values and goals of care important to the patient.  She says she is  open to going to rehab or to take it 1 day at a time.  She does not want to speak of outcomes and plans to be on rehab because she is not sure how strong her healthy she will get.  She shares she had reserved an independent living apartment in [place], but says that she does not think she will be able to return to that level of independence. ? ?As far as symptom management, patient endorses chronic low back pain.  She says she takes Tylenol and it "does nothing".  She says that her pain is relieved when she lies flat in the bed.  Discussed Tylenol as base of pain management.  Reviewed that additional medications will have a synergistic effect with Tylenol already on board.  Patient endorses she understands.  She also  shares she is started Vitapro back to bed.   ? ?Nursing notified that patient would like to return back to bed.  Patient was appreciative of our visit and felt that she looks forward to talking to my colleague once she leaves. ? ?Discussed with patient the importance of continued conversation with family and the medical providers regarding overall plan of care and treatment options, ensuring decisions are within the context of the patient?s values and GOCs.  Palliative Care services at the [facility] were explained and offered. Pt receptive to meeting with my colleague NP [place] at [facility] as adjunct to her cancer center visits.  ? ?Questions and concerns were addressed. The patient was encouraged to call with questions or concerns.  ? ?Primary Decision Maker ?PATIENT ? ?Code Status/Advance Care Planning: ?DNR ? ?Prognosis:   ?Unable to determine ? ?Discharge Planning: [place] for rehab with Palliative care service follow-up ? ?Primary Diagnoses: ?Present on Admission: ? Acute respiratory failure ([place]) ? Altered mental status ? Protein-calorie malnutrition, severe ? Essential hypertension ? Multiple myeloma ([place]) ? ? ?Physical Exam ?Vitals and nursing note reviewed.  ?Constitutional:   ?   Comments: frail  ?HENT:  ?   Head: Normocephalic.  ?   Mouth/Throat:  ?   Mouth: Mucous membranes are moist.  ?Eyes:  ?   Pupils: Pupils are equal, round, and reactive to light.  ?Cardiovascular:  ?   Rate and Rhythm: Normal rate.  ?   Pulses: Normal pulses.  ?Pulmonary:  ?   Effort: Pulmonary effort is normal.  ?Musculoskeletal:  ?   Comments: Generalized weakness  ?Skin: ?   General: Skin is warm and dry.  ?   Comments: Bilateral UE ecchymosis  ?Neurological:  ?   Mental Status: She is alert. Mental status is at baseline.  ?Psychiatric:     ?   Mood and Affect: Mood normal.     ?   Behavior: Behavior normal.     ?   Thought Content: Thought content normal.     ?   Judgment: Judgment normal.   ? ? ?Palliative Assessment/Data: 50% ? ? ? ? ?I discussed this patient's plan of care with patient. ? ?Thank you for this consult. Palliative medicine will continue to follow and assist holistically.  ? ?Time Total: 75 minutes ?Greater than 50%  of this time was spent counseling and coordinating care related to the above assessment and plan. ? ?Signed by: ?Jordan Hawks, DNP, FNP-BC ?Palliative Medicine ? ?  ?Please contact Palliative Medicine Team phone at [phone] for questions and concerns.  ?For individual provider: See Amion ? ? ? ? ? ? ? ? ? ? ? ? ?  ?

## 2022-03-19 NOTE — Progress Notes (Signed)
Physical Therapy Treatment ?Patient Details ?Name: Wendy [name]. Pellegrin ?MRN: [id number] ?DOB: [birth year] ?Today's Date: 03/19/2022 ? ? ?History of Present Illness Pt is a 76 yo F with PMH that includes: multiple myeloma, HTN, hep-A, osteoporosis, CHF, DVTs, and CAD.  Pt presenting to [facility] ED from home via EMS on 03/10/22 with complaints of respiratory distress. MD assessment includes: Acute respiratory failure with pt intubated now extubated, AMS, acute CVA including multiple small infarcts involving parietal and frontal lobes bilaterally, concern of seizure activity, and severe protein-calorie malnutrition. ? ?  ?PT Comments  ? ? Pt was pleasant and motivated to participate during the session and put forth good effort throughout. ?Pt continued to require physical assistance with all functional tasks but grossly less so than during the prior session.  Pt's posterior instability in sitting and standing also improved this date.  Pt was able to take several very small, shuffling steps and to perform a stand pivot transfer to the chair with mod A and heavy cuing for sequencing.  Pt will benefit from PT services in a SNF setting upon discharge to safely address deficits listed in patient problem list for decreased caregiver assistance and eventual return to PLOF. ?   ?Recommendations for follow up therapy are one component of a multi-disciplinary discharge planning process, led by the attending physician.  Recommendations may be updated based on patient status, additional functional criteria and insurance authorization. ? ?Follow Up Recommendations ? Skilled nursing-short term rehab (<3 hours/day) ?  ?  ?Assistance Recommended at Discharge Frequent or constant Supervision/Assistance  ?Patient can return home with the following Two people to help with walking and/or transfers;Two people to help with bathing/dressing/bathroom;Assistance with cooking/housework;Direct supervision/assist for financial management;Assist for  transportation;Help with stairs or ramp for entrance ?  ?Equipment Recommendations ? None recommended by PT  ?  ?Recommendations for Other Services   ? ? ?  ?Precautions / Restrictions Precautions ?Precautions: Fall ?Restrictions ?Weight Bearing Restrictions: No  ?  ? ?Mobility ? Bed Mobility ?Overal bed mobility: Needs Assistance ?  ?  ?  ?Supine to sit: Min assist ?  ?  ?General bed mobility comments: Min A for BLE and trunk control ?  ? ?Transfers ?Overall transfer level: Needs assistance ?Equipment used: Rolling walker (2 wheels) ?Transfers: Bed to chair/wheelchair/BSC, Sit to/from Stand ?Sit to Stand: Mod assist, From elevated surface ?Stand pivot transfers: Mod assist, From elevated surface ?  ?  ?  ?  ?General transfer comment: Max verbal cues for sequencing for increased trunk flexion and hand placement ?  ? ?Ambulation/Gait ?Ambulation/Gait assistance: Mod assist ?Gait Distance (Feet): 1 Feet ?Assistive device: Rolling walker (2 wheels) ?Gait Pattern/deviations: Trunk flexed, Step-to pattern, Decreased step length - right, Decreased step length - left, Shuffle ?Gait velocity: decreased ?  ?  ?General Gait Details: Pt able to take several very small, shuffling steps at the EOB with Mod A for stability and to advance the RW; pt required heavy cuing for sequencing to initiate movement ? ? ?Stairs ?  ?  ?  ?  ?  ? ? ?Wheelchair Mobility ?  ? ?Modified Rankin (Stroke Patients Only) ?  ? ? ?  ?Balance Overall balance assessment: Needs assistance ?Sitting-balance support: Feet supported, Single extremity supported ?Sitting balance-Leahy Scale: Poor ?Sitting balance - Comments: Generally poor trunk control in sitting with heavy cuing needed for upright posture and anterior weight shift to prevent posterior LOB but grossly improved from prior session ?  ?Standing balance support: Bilateral upper extremity supported, During  functional activity ?Standing balance-Leahy Scale: Poor ?Standing balance comment: frequent  Mod A in standing for stability ?  ?  ?  ?  ?  ?  ?  ?  ?  ?  ?  ?  ? ?  ?Cognition Arousal/Alertness: Awake/alert ?Behavior During Therapy: [facility] for tasks assessed/performed ?Overall Cognitive Status: Within Functional Limits for tasks assessed ?  ?  ?  ?  ?  ?  ?  ?  ?  ?  ?  ?  ?  ?  ?  ?  ?  ?  ?  ? ?  ?Exercises Total Joint Exercises ?Hip ABduction/ADduction: AAROM, Strengthening, Both, 10 reps ?Straight Leg Raises: AAROM, Strengthening, Both, 10 reps ?Long Arc Quad: AROM, Strengthening, Both, 10 reps ?Knee Flexion: AROM, Strengthening, Both, 10 reps ? ?  ?General Comments   ?  ?  ? ?Pertinent Vitals/Pain Pain Assessment ?Pain Assessment: No/denies pain  ? ? ?Home Living   ?  ?  ?  ?  ?  ?  ?  ?  ?  ?   ?  ?Prior Function    ?  ?  ?   ? ?PT Goals (current goals can now be found in the care plan section) Progress towards PT goals: Progressing toward goals ? ?  ?Frequency ? ? ? 7X/week ? ? ? ?  ?PT Plan Current plan remains appropriate  ? ? ?Co-evaluation   ?  ?  ?  ?  ? ?  ?AM-PAC PT "6 Clicks" Mobility   ?Outcome Measure ? Help needed turning from your back to your side while in a flat bed without using bedrails?: A Lot ?Help needed moving from lying on your back to sitting on the side of a flat bed without using bedrails?: A Little ?Help needed moving to and from a bed to a chair (including a wheelchair)?: A Lot ?Help needed standing up from a chair using your arms (e.g., wheelchair or bedside chair)?: A Lot ?Help needed to walk in hospital room?: Total ?Help needed climbing 3-5 steps with a railing? : Total ?6 Click Score: 11 ? ?  ?End of Session Equipment Utilized During Treatment: Gait belt ?Activity Tolerance: Patient tolerated treatment well ?Patient left: in chair;with call bell/phone within reach;with chair alarm set;with SCD's reapplied ?Nurse Communication: Mobility status ?PT Visit Diagnosis: Unsteadiness on feet (R26.81);History of falling (Z91.81);Muscle weakness (generalized) (M62.81);Difficulty in  walking, not elsewhere classified (R26.2) ?  ? ? ?Time: [id number] ?PT Time Calculation (min) (ACUTE ONLY): 29 min ? ?Charges:  $Therapeutic Exercise: 8-22 mins ?$Therapeutic Activity: 8-22 mins          ?          ? ?D. Royetta Asal PT, DPT ?03/19/22, 11:29 AM ? ? ?

## 2022-03-19 NOTE — Care Management Important Message (Signed)
Important Message ? ?Patient Details  ?Name: Wendy [name] ?MRN: [id number] ?Date of Birth: 26-May-1946 ? ? ?Medicare Important Message Given:  Yes ? ? ? ? ?Juliann Pulse A Harris Penton ?03/19/2022, 11:38 AM ?

## 2022-03-19 NOTE — TOC Progression Note (Signed)
Transition of Care (TOC) - Progression Note  ? ? ?Patient Details  ?Name: Wendy [name] ?MRN: [id number] ?Date of Birth: [birth year] ? ?Transition of Care (TOC) CM/SW Contact  ?Pete Pelt, RN ?Phone Number: ?03/19/2022, 10:14 AM ? ?Clinical Narrative:   Patient chooses [facility]  As per Sharyn Lull they can accept her this afternoon.  EMS set up for 1400 hours.  Patient and care team aware. ? ? ? ?Expected Discharge Plan: [place] ?Barriers to Discharge: Continued Medical Work up ? ?Expected Discharge Plan and Services ?Expected Discharge Plan: [place] ?  ?Discharge Planning Services: CM Consult ?  ?Living arrangements for the past 2 months: [place] ?                ?DME Arranged: N/A ?DME Agency: NA ?  ?  ?  ?  ?[place] Agency: [facility] ?Date HH Agency Contacted: 03/16/22 ?Time [place]: 1093 ?Representative spoke with at [place]: Rema Jasmine ? ? ?Social Determinants of Health (SDOH) Interventions ?  ? ?Readmission Risk Interventions ?   ? View : No data to display.  ?  ?  ?  ? ? ?

## 2022-03-22 ENCOUNTER — Other Ambulatory Visit: Payer: Medicare Other

## 2022-03-22 ENCOUNTER — Telehealth: Payer: Self-pay | Admitting: *Deleted

## 2022-03-22 ENCOUNTER — Other Ambulatory Visit: Payer: Self-pay | Admitting: *Deleted

## 2022-03-22 ENCOUNTER — Ambulatory Visit: Payer: Medicare Other | Admitting: Hematology and Oncology

## 2022-03-22 ENCOUNTER — Ambulatory Visit: Payer: Medicare Other

## 2022-03-22 NOTE — Telephone Encounter (Signed)
Wendy [name], Wendy Fanny, MD  Earleen Reaper M, CMA ?Please check on her tomorrow and schedule f/u when able  ?

## 2022-03-22 NOTE — Telephone Encounter (Signed)
Left VM requesting pt to call the office back 

## 2022-03-22 NOTE — Patient Outreach (Signed)
Per [place] eligible member currently resides in [facility].  Screened for potential  [facility] care coordination services as a benefit of [employer] plan. ? ?Member's PCP at [facility] at [facility] has [place] care coordination services available.Marland Kitchen CCM services will need to be ordered by PCP if appropriate. ? ?Wendy [name] admitted to SNF on 03/19/22 after hospitalization. ? ?Secure communication sent to facility SW to make aware writer is following for transition plans and [facility] needs. ? ? ?Marthenia Rolling, MSN, RN,BSN ?[place] Coordinator ?[phone] [facility]) ?[phone]  (Toll free office)   ?

## 2022-03-26 ENCOUNTER — Other Ambulatory Visit: Payer: Self-pay | Admitting: *Deleted

## 2022-03-26 NOTE — Patient Outreach (Signed)
THN Post- Acute Care Coordinator follow up. Per [place] eligible member currently resides in [facility].  Screening for potential [facility] care coordination services as a benefit of [employer] plan. ? ?Facility site visit to [facility] skilled nursing facility. Met with [place], SNF SW concerning  transition plan and potential [facility] needs. Anticipated transition plan is [facility] post SNF.  ? ?Will continue to follow. ? ? ?Marthenia Rolling, MSN, RN,BSN ?[place] Coordinator ?[phone] [facility]) ?[phone]  (Toll free office)  ? ? ? ? ?  ?

## 2022-04-06 NOTE — Telephone Encounter (Signed)
Left VM requesting pt to call the office back 

## 2022-04-13 ENCOUNTER — Telehealth: Payer: Self-pay | Admitting: *Deleted

## 2022-04-13 NOTE — Telephone Encounter (Signed)
?  Chronic Care Management  ? ?Note ? ?04/13/2022 ?Name: Wendy [name]. Antosh MRN: [id number] DOB: Jun 26, 1946 ? ? ? ?Follow up plan: ?No further follow up required: pt currently in SNF and to be transferred to ALF for long term care  ? ?Eduard Clos MSW, LCSW ?Licensed Clinical Social Worker ?[place]   ?[phone]  ?

## 2022-04-17 ENCOUNTER — Encounter: Payer: Self-pay | Admitting: Hematology and Oncology

## 2022-04-18 ENCOUNTER — Telehealth: Payer: Self-pay | Admitting: *Deleted

## 2022-04-18 NOTE — Telephone Encounter (Signed)
TCT patient's daughter this morning. Her daughter had sent a MyChart message yesterday regarding her mother's current condition ?Spoke with Amy and she states her mother is very frail after her stroke, weighs only 84 lbs, is very weak, not eating, incontinent.  Her mom will be transferred to [facility] on May 1st. She will call to reschedule after her mom has been there for awhile to see if she gets stronger.  Dr. Lorenso Courier aware of the above. ? ?Scheduling message sent to cancel and to re-schedule in 1 month (for a place holder) ?

## 2022-04-19 ENCOUNTER — Telehealth: Payer: Self-pay | Admitting: Hematology and Oncology

## 2022-04-19 ENCOUNTER — Ambulatory Visit: Payer: Medicare Other

## 2022-04-19 ENCOUNTER — Encounter: Payer: Self-pay | Admitting: Hematology and Oncology

## 2022-04-19 ENCOUNTER — Other Ambulatory Visit: Payer: Medicare Other

## 2022-04-19 ENCOUNTER — Ambulatory Visit: Payer: Medicare Other | Admitting: Hematology and Oncology

## 2022-04-19 ENCOUNTER — Encounter: Payer: Medicare Other | Admitting: Nurse Practitioner

## 2022-04-19 ENCOUNTER — Non-Acute Institutional Stay: Payer: Medicare Other | Admitting: Student

## 2022-04-19 DIAGNOSIS — Z515 Encounter for palliative care: Secondary | ICD-10-CM

## 2022-04-19 DIAGNOSIS — R110 Nausea: Secondary | ICD-10-CM

## 2022-04-19 DIAGNOSIS — C9000 Multiple myeloma not having achieved remission: Secondary | ICD-10-CM

## 2022-04-19 DIAGNOSIS — R531 Weakness: Secondary | ICD-10-CM

## 2022-04-19 DIAGNOSIS — E43 Unspecified severe protein-calorie malnutrition: Secondary | ICD-10-CM

## 2022-04-19 NOTE — Telephone Encounter (Signed)
.  Called patient to schedule appointment per 4/26 inbasket, patient is aware of date and time.   ?

## 2022-04-19 NOTE — Progress Notes (Signed)
? ? ?Manufacturing engineer ?Community Palliative Care Consult Note ?Telephone: [phone]  ?Fax: [phone]  ? ?Date of encounter: 04/19/22 ?10:30 PM ?PATIENT NAME: Wendy L. [address] ?[address] ?[place] [address]   ?[phone] (home)  ?DOB: [birth year] ?MRN: [id number] ?PRIMARY CARE PROVIDER:    ?Tower, Wynelle Fanny, MD,  ?[address] [place] [address] ?[phone] ? ?REFERRING PROVIDER:   ?Vilinda Boehringer, NP ? ?RESPONSIBLE PARTY:    ?Contact Information   ? ? Name Relation Home Work Mobile  ? Darnell Level (POA) Daughter [phone]  [phone]  ? VanHasselt,Martin Relative [phone]  [phone]  ? ?  ? ? ? ?I met face to face with patient and family in the facility. Palliative Care was asked to follow this patient by consultation request of  Vilinda Boehringer, NP to address advance care planning and complex medical decision making. This is the initial visit.  ? ? ?                                 ASSESSMENT AND PLAN / RECOMMENDATIONS:  ? ?Advance Care Planning/Goals of Care: Goals include to maximize quality of life and symptom management. Patient/health care surrogate gave his/her permission to discuss.Our advance care planning conversation included a discussion about:    ?The value and importance of advance care planning  ?Experiences with loved ones who have been seriously ill or have died  ?Exploration of personal, cultural or spiritual beliefs that might influence medical decisions  ?Exploration of goals of care in the event of a sudden injury or illness  ?CODE STATUS: Full Code ? ?Patient states she rescinded her DNR. She is open to hospitalization. We will continue to discuss code status and wishes going forward. Palliative medicine will continue to provide support, symptom management. Daughter would like for palliative to continue once she moves to [facility]. ? ?Symptom Management/Plan: ? ?Multiple Myeloma-would like to regain strength, meet with oncology when feeling  better to discuss plan going forward regarding immunotherapy.  ? ?Protein calorie malnutrition, weight loss-patient with poor appetite. She states she does not like food at facility and does not always drink supplements. Encourage patient to eat foods she enjoys, start drinking supplements daily. We discussed medication to help stimulate appetite; she would like to move first and see how she does. Albumin 2.8. Weight 84 pounds, > 10% loss in past 2-3 months.  ? ?Generalized weakness-secondary to CVA, HF. Patient would like to continue therapy once she moves. Education provided on nutritional intake. Use walker for ambulation.  ? ?Nausea-start ondansetron 4 mg every 8 hours PRN.  ? ?Follow up Palliative Care Visit: Palliative care will continue to follow for complex medical decision making, advance care planning, and clarification of goals. Return in 2-4 weeks or prn. ? ? ?This visit was coded based on medical decision making (MDM). ? ?PPS: 40% ? ?HOSPICE ELIGIBILITY/DIAGNOSIS: TBD ? ?Chief Complaint: Palliative Medicine initial consult.  ? ?HISTORY OF PRESENT ILLNESS:  Wendy [name] is a 76 y.o. year old female  with CVA, essential hypertension, hyperlipidemia, seizure disorder. Patient hospitalized 3/18-3/27/23 due to acute respiratory failure, acute CHF, AMS, CVA, seizure, protein calorie malnutrition. ? ?Patient currently at [facility]. She has been receiving therapy and will transition to [facility] on Monday. Endorses weakness, nausea. Denies shortness of breath. She denies pain at present; hx of chronic back pain, osteoporosis, spinal fusion. Takes tramadol PRN. Patient is  ambulating with walker, also uses wheelchair for locomotion. Prior to hospitalization, she lived alone and fairly independent. Hospital bed ordered; she has DME such as w/c bedside commode.  ? ?History obtained from review of EMR, discussion with primary team, and interview with family, facility staff/caregiver and/or  Ms. Grimsley.  ?I reviewed available labs, medications, imaging, studies and related documents from the EMR.  Records reviewed and summarized above.  ? ?ROS ? ?General: NAD ?EYES: denies vision changes ?ENMT: denies dysphagia ?Cardiovascular: denies chest pain, denies DOE ?Pulmonary: denies cough, denies increased SOB ?Abdomen: endorses poor appetite, incontinence of bowel ?GU: denies dysuria, incontinence of urine ?MSK: increased weakness ?Skin: denies rashes or wounds ?Neurological: denies insomnia ?Psych: Endorses stable mood ?Heme/lymph/immuno: denies bruises, abnormal bleeding ? ?Physical Exam: ?Weight: 84pounds ?Pulse 72, resp 16, sats 97% on room air ?Constitutional: NAD ?General: frail appearing, thin ?EYES: anicteric sclera, lids intact, no discharge  ?ENMT: intact hearing, oral mucous membranes moist ?CV: S1S2, RRR, 1-2 + LE edema, R > L ?Pulmonary: LCTA, no increased work of breathing, no cough, room air ?Abdomen: normo-active BS + 4 quadrants, soft and non tender, no ascites ?GU: deferred ?MSK: + sarcopenia, moves all extremities, ambulatory ?Skin: warm and dry, no rashes or wounds on visible skin ?Neuro: generalized weakness, right upper extremity weakness ?Psych: non-anxious affect, A and O x 3 ?Hem/lymph/immuno: no widespread bruising ?CURRENT PROBLEM LIST:  ?Patient Active Problem List  ? Diagnosis Date Noted  ? Acute CVA (cerebrovascular accident) ([place]) 03/16/2022  ? Seizure ([place]) 03/16/2022  ? Protein-calorie malnutrition, severe 03/12/2022  ? Acute congestive heart failure ([place])   ? Altered mental status   ? Acute respiratory failure ([place]) 03/11/2022  ? Pain in buttock 02/09/2022  ? Low back pain 02/09/2022  ? Poor balance 02/09/2022  ? History of fall 02/09/2022  ? Generalized weakness 02/09/2022  ? Stress reaction 12/24/2021  ? Current use of proton pump inhibitor 10/27/2021  ? Full code status 05/31/2021  ? Flash pulmonary edema ([place]) 04/30/2021  ? Heme positive stool 04/30/2021  ? Hyperkalemia  04/30/2021  ? Compression fracture of T10 vertebra ([place]) 03/14/2021  ? History of DVT (deep vein thrombosis) 03/13/2021  ? Multiple myeloma ([place]) 02/10/2021  ? Leucocytosis 02/10/2021  ? 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 Situational anxiety 01/11/2021  ? 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  ? Hyperlipidemia 10/19/2020  ? Hearing loss 06/14/2020  ? Pedal edema 06/01/2020  ? Aortic atherosclerosis ([place]) 04/06/2020  ? CAD (coronary artery disease) 04/06/2020  ? H/O compression fracture of spine 04/06/2020  ? Chronic back pain 04/06/2020  ? Abnormal CT scan of lung 03/17/2020  ? Heartburn 06/02/2018  ? Chronic constipation 12/31/2017  ? Blood glucose elevated 09/25/2017  ? History of ileus 06/07/2017  ? Right carpal tunnel syndrome 12/07/2016  ? Estrogen deficiency 09/24/2016  ? Routine general medical examination at a health care facility 09/04/2015  ? Colon cancer screening 12/11/2014  ? Encounter for Medicare annual wellness exam 05/17/2013  ? Osteoarthritis 03/28/2011  ? Degenerative disc disease, lumbar 03/28/2011  ? Hyponatremia 02/12/2011  ? Essential hypertension 08/01/2010  ? Osteoporosis 08/01/2010  ? ?PAST MEDICAL HISTORY:  ?Active Ambulatory Problems  ?  Diagnosis Date Noted  ? Essential hypertension 08/01/2010  ? Osteoporosis 08/01/2010  ? Hyponatremia 02/12/2011  ? Osteoarthritis 03/28/2011  ? Degenerative disc disease, lumbar 03/28/2011  ? Encounter for Medicare annual wellness exam 05/17/2013  ? Colon cancer screening 12/11/2014  ? Routine general medical examination at a health care facility 09/04/2015  ?  Estrogen deficiency 09/24/2016  ? Right carpal tunnel syndrome 12/07/2016  ? History of ileus 06/07/2017  ? Blood glucose elevated 09/25/2017  ? Chronic constipation 12/31/2017  ? Heartburn 06/02/2018  ? Abnormal CT scan of lung 03/17/2020  ? Aortic atherosclerosis ([place]) 04/06/2020  ? CAD (coronary artery disease) 04/06/2020  ? H/O compression fracture of spine 04/06/2020  ? Chronic back  pain 04/06/2020  ? Pedal edema 06/01/2020  ? Hearing loss 06/14/2020  ? Hyperlipidemia 10/19/2020  ? Situational anxiety 01/11/2021  ? 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  ? 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

## 2022-04-20 ENCOUNTER — Telehealth: Payer: Self-pay | Admitting: Pulmonary Disease

## 2022-04-20 NOTE — Telephone Encounter (Signed)
Called and spoke with patient who states that she had a stoke in March and was admitted to [facility]. When [facility] called to give her information about CT scan She said she was not going to go. Will forward to Dr. Loanne Drilling. ?

## 2022-04-23 ENCOUNTER — Other Ambulatory Visit: Payer: Self-pay | Admitting: *Deleted

## 2022-04-23 NOTE — Patient Outreach (Signed)
THN Post- Acute Care Coordinator follow up. Update received from [place], [place] and [place] indicating Ms. Deist transitioned to [facility] today 04/23/22. ? ?Will make [facility]ed[facility] care coordination team aware. ? ?Will sign off. ? ? ?Marthenia Rolling, MSN, RN,BSN ?[place] Coordinator ?[phone] [facility]) ?[phone]  (Toll free office)  ?

## 2022-04-23 NOTE — Patient Outreach (Addendum)
THN Post- Acute Care Coordinator follow up. Per [place] eligible member currently resides in [facility].   ? ?Member's PCP at [facility] has [facility] care coordination services. Ms. Vanderstelt was active with RNCM and LCSW CCM services prior to admission.  ? ?Secure communication sent to facility SW to inquire about transition plans/date. ? ?Will continue to follow. ? ? ? ?Marthenia Rolling, MSN, RN,BSN ?[place] Coordinator ?[phone] [facility]) ?[phone]  (Toll free office)   ?

## 2022-04-24 ENCOUNTER — Telehealth: Payer: Self-pay

## 2022-04-24 ENCOUNTER — Telehealth: Payer: Self-pay | Admitting: *Deleted

## 2022-04-24 NOTE — Chronic Care Management (AMB) (Signed)
  Chronic Care Management Note  04/24/2022 Name: Wendy [name]. Gaertner MRN: [id number] DOB: 01-15-1946  [name]. Pain is a 76 y.o. year old female who is a primary care patient of Tower, Wynelle Fanny, MD and is actively engaged with the care management team. I reached out to [place]. Notz by phone today to assist with scheduling a follow up visit with the RN Case Manager  Follow up plan: Unsuccessful telephone outreach attempt made. A HIPAA compliant phone message was left for the patient providing contact information and requesting a return call.   Julian Hy, [place] Management  Direct Dial: [phone]

## 2022-04-24 NOTE — Telephone Encounter (Signed)
Benefits submitted ?Next injection due 05/15/22 ?

## 2022-05-06 ENCOUNTER — Encounter (HOSPITAL_COMMUNITY): Payer: Self-pay

## 2022-05-06 ENCOUNTER — Emergency Department (HOSPITAL_COMMUNITY): Payer: Medicare Other

## 2022-05-06 ENCOUNTER — Inpatient Hospital Stay (HOSPITAL_COMMUNITY)
Admission: EM | Admit: 2022-05-06 | Discharge: 2022-05-11 | DRG: 291 | Disposition: A | Discharge status: Nursing Home (Includes ICF) | Payer: Medicare Other | Source: Skilled Nursing Facility | Attending: Internal Medicine | Admitting: Internal Medicine

## 2022-05-06 ENCOUNTER — Other Ambulatory Visit: Payer: Self-pay

## 2022-05-06 DIAGNOSIS — J90 Pleural effusion, not elsewhere classified: Secondary | ICD-10-CM

## 2022-05-06 DIAGNOSIS — I5033 Acute on chronic diastolic (congestive) heart failure: Secondary | ICD-10-CM | POA: Diagnosis present

## 2022-05-06 DIAGNOSIS — M810 Age-related osteoporosis without current pathological fracture: Secondary | ICD-10-CM | POA: Diagnosis present

## 2022-05-06 DIAGNOSIS — E871 Hypo-osmolality and hyponatremia: Secondary | ICD-10-CM | POA: Diagnosis present

## 2022-05-06 DIAGNOSIS — Z888 Allergy status to other drugs, medicaments and biological substances status: Secondary | ICD-10-CM

## 2022-05-06 DIAGNOSIS — G40909 Epilepsy, unspecified, not intractable, without status epilepticus: Secondary | ICD-10-CM | POA: Diagnosis present

## 2022-05-06 DIAGNOSIS — J9601 Acute respiratory failure with hypoxia: Secondary | ICD-10-CM | POA: Diagnosis not present

## 2022-05-06 DIAGNOSIS — M549 Dorsalgia, unspecified: Secondary | ICD-10-CM | POA: Diagnosis present

## 2022-05-06 DIAGNOSIS — I2510 Atherosclerotic heart disease of native coronary artery without angina pectoris: Secondary | ICD-10-CM | POA: Diagnosis present

## 2022-05-06 DIAGNOSIS — R54 Age-related physical debility: Secondary | ICD-10-CM | POA: Diagnosis present

## 2022-05-06 DIAGNOSIS — G8929 Other chronic pain: Secondary | ICD-10-CM | POA: Diagnosis present

## 2022-05-06 DIAGNOSIS — I10 Essential (primary) hypertension: Secondary | ICD-10-CM | POA: Diagnosis not present

## 2022-05-06 DIAGNOSIS — K219 Gastro-esophageal reflux disease without esophagitis: Secondary | ICD-10-CM | POA: Diagnosis present

## 2022-05-06 DIAGNOSIS — R911 Solitary pulmonary nodule: Secondary | ICD-10-CM | POA: Diagnosis present

## 2022-05-06 DIAGNOSIS — I509 Heart failure, unspecified: Secondary | ICD-10-CM

## 2022-05-06 DIAGNOSIS — E8581 Light chain (AL) amyloidosis: Secondary | ICD-10-CM | POA: Diagnosis present

## 2022-05-06 DIAGNOSIS — Z885 Allergy status to narcotic agent status: Secondary | ICD-10-CM

## 2022-05-06 DIAGNOSIS — Z7982 Long term (current) use of aspirin: Secondary | ICD-10-CM

## 2022-05-06 DIAGNOSIS — I5031 Acute diastolic (congestive) heart failure: Secondary | ICD-10-CM | POA: Diagnosis not present

## 2022-05-06 DIAGNOSIS — Z86718 Personal history of other venous thrombosis and embolism: Secondary | ICD-10-CM

## 2022-05-06 DIAGNOSIS — Z882 Allergy status to sulfonamides status: Secondary | ICD-10-CM

## 2022-05-06 DIAGNOSIS — Z886 Allergy status to analgesic agent status: Secondary | ICD-10-CM

## 2022-05-06 DIAGNOSIS — J9600 Acute respiratory failure, unspecified whether with hypoxia or hypercapnia: Secondary | ICD-10-CM | POA: Diagnosis present

## 2022-05-06 DIAGNOSIS — M8458XA Pathological fracture in neoplastic disease, other specified site, initial encounter for fracture: Secondary | ICD-10-CM | POA: Diagnosis present

## 2022-05-06 DIAGNOSIS — I110 Hypertensive heart disease with heart failure: Principal | ICD-10-CM | POA: Diagnosis present

## 2022-05-06 DIAGNOSIS — Z66 Do not resuscitate: Secondary | ICD-10-CM | POA: Diagnosis present

## 2022-05-06 DIAGNOSIS — J918 Pleural effusion in other conditions classified elsewhere: Secondary | ICD-10-CM | POA: Diagnosis present

## 2022-05-06 DIAGNOSIS — C9000 Multiple myeloma not having achieved remission: Secondary | ICD-10-CM | POA: Diagnosis present

## 2022-05-06 DIAGNOSIS — Z880 Allergy status to penicillin: Secondary | ICD-10-CM

## 2022-05-06 DIAGNOSIS — Z20822 Contact with and (suspected) exposure to covid-19: Secondary | ICD-10-CM | POA: Diagnosis present

## 2022-05-06 DIAGNOSIS — E785 Hyperlipidemia, unspecified: Secondary | ICD-10-CM | POA: Diagnosis present

## 2022-05-06 DIAGNOSIS — Z8673 Personal history of transient ischemic attack (TIA), and cerebral infarction without residual deficits: Secondary | ICD-10-CM

## 2022-05-06 DIAGNOSIS — E43 Unspecified severe protein-calorie malnutrition: Secondary | ICD-10-CM | POA: Diagnosis present

## 2022-05-06 DIAGNOSIS — Z8249 Family history of ischemic heart disease and other diseases of the circulatory system: Secondary | ICD-10-CM

## 2022-05-06 DIAGNOSIS — Z87891 Personal history of nicotine dependence: Secondary | ICD-10-CM

## 2022-05-06 DIAGNOSIS — Z681 Body mass index (BMI) 19 or less, adult: Secondary | ICD-10-CM

## 2022-05-06 DIAGNOSIS — Z7902 Long term (current) use of antithrombotics/antiplatelets: Secondary | ICD-10-CM

## 2022-05-06 DIAGNOSIS — Z9221 Personal history of antineoplastic chemotherapy: Secondary | ICD-10-CM

## 2022-05-06 DIAGNOSIS — Z79899 Other long term (current) drug therapy: Secondary | ICD-10-CM

## 2022-05-06 LAB — COMPREHENSIVE METABOLIC PANEL
ALT: 21 U/L (ref 0–44)
AST: 28 U/L (ref 15–41)
Albumin: 2.7 g/dL — ABNORMAL LOW (ref 3.5–5.0)
Alkaline Phosphatase: 286 U/L — ABNORMAL HIGH (ref 38–126)
Anion gap: 8 (ref 5–15)
BUN: 12 mg/dL (ref 8–23)
CO2: 26 mmol/L (ref 22–32)
Calcium: 8.4 mg/dL — ABNORMAL LOW (ref 8.9–10.3)
Chloride: 100 mmol/L (ref 98–111)
Creatinine, Ser: 0.7 mg/dL (ref 0.44–1.00)
GFR, Estimated: >60 mL/min (ref >60)
Glucose, Bld: 130 mg/dL — ABNORMAL HIGH (ref 70–99)
Potassium: 3.8 mmol/L (ref 3.5–5.1)
Sodium: 134 mmol/L — ABNORMAL LOW (ref 135–145)
Total Bilirubin: 0.9 mg/dL (ref 0.3–1.2)
Total Protein: 5.6 g/dL — ABNORMAL LOW (ref 6.5–8.1)

## 2022-05-06 LAB — CBC WITH DIFFERENTIAL/PLATELET
Abs Immature Granulocytes: 0.05 10*3/uL (ref 0.00–0.07)
Basophils Absolute: 0.1 10*3/uL (ref 0.0–0.1)
Basophils Relative: 1 %
Eosinophils Absolute: 0.1 10*3/uL (ref 0.0–0.5)
Eosinophils Relative: 1 %
HCT: 34.3 % — ABNORMAL LOW (ref 36.0–46.0)
Hemoglobin: 11.2 g/dL — ABNORMAL LOW (ref 12.0–15.0)
Immature Granulocytes: 1 %
Lymphocytes Relative: 9 %
Lymphs Abs: 0.9 10*3/uL (ref 0.7–4.0)
MCH: 30.1 pg (ref 26.0–34.0)
MCHC: 32.7 g/dL (ref 30.0–36.0)
MCV: 92.2 fL (ref 80.0–100.0)
Monocytes Absolute: 0.8 10*3/uL (ref 0.1–1.0)
Monocytes Relative: 8 %
Neutro Abs: 8.4 10*3/uL — ABNORMAL HIGH (ref 1.7–7.7)
Neutrophils Relative %: 80 %
Platelets: 496 10*3/uL — ABNORMAL HIGH (ref 150–400)
RBC: 3.72 MIL/uL — ABNORMAL LOW (ref 3.87–5.11)
RDW: 15.5 % (ref 11.5–15.5)
WBC: 10.3 10*3/uL (ref 4.0–10.5)
nRBC: 0 % (ref 0.0–0.2)

## 2022-05-06 LAB — RESP PANEL BY RT-PCR (FLU A&B, COVID) ARPGX2
Influenza A by PCR: NEGATIVE
Influenza B by PCR: NEGATIVE
SARS Coronavirus 2 by RT PCR: NEGATIVE

## 2022-05-06 LAB — TROPONIN I (HIGH SENSITIVITY)
Troponin I (High Sensitivity): 30 ng/L — ABNORMAL HIGH (ref <18)
Troponin I (High Sensitivity): 31 ng/L — ABNORMAL HIGH (ref <18)

## 2022-05-06 LAB — I-STAT VENOUS BLOOD GAS, ED
Acid-Base Excess: 2 mmol/L (ref 0.0–2.0)
Bicarbonate: 29.1 mmol/L — ABNORMAL HIGH (ref 20.0–28.0)
Calcium, Ion: 1.12 mmol/L — ABNORMAL LOW (ref 1.15–1.40)
HCT: 35 % — ABNORMAL LOW (ref 36.0–46.0)
Hemoglobin: 11.9 g/dL — ABNORMAL LOW (ref 12.0–15.0)
O2 Saturation: 98 %
Potassium: 3.8 mmol/L (ref 3.5–5.1)
Sodium: 134 mmol/L — ABNORMAL LOW (ref 135–145)
TCO2: 31 mmol/L (ref 22–32)
pCO2, Ven: 55.2 mmHg (ref 44–60)
pH, Ven: 7.33 (ref 7.25–7.43)
pO2, Ven: 117 mmHg — ABNORMAL HIGH (ref 32–45)

## 2022-05-06 LAB — BRAIN NATRIURETIC PEPTIDE: B Natriuretic Peptide: 2404.2 pg/mL — ABNORMAL HIGH (ref 0.0–100.0)

## 2022-05-06 LAB — MAGNESIUM: Magnesium: 1.8 mg/dL (ref 1.7–2.4)

## 2022-05-06 IMAGING — DX DG CHEST 1V PORT
1 series · 1 of 1 positions shown · non-contrast
Comparison: [DATE]

CLINICAL DATA: Short of breath, hypoxia

EXAM:
PORTABLE CHEST 1 VIEW

[chest]
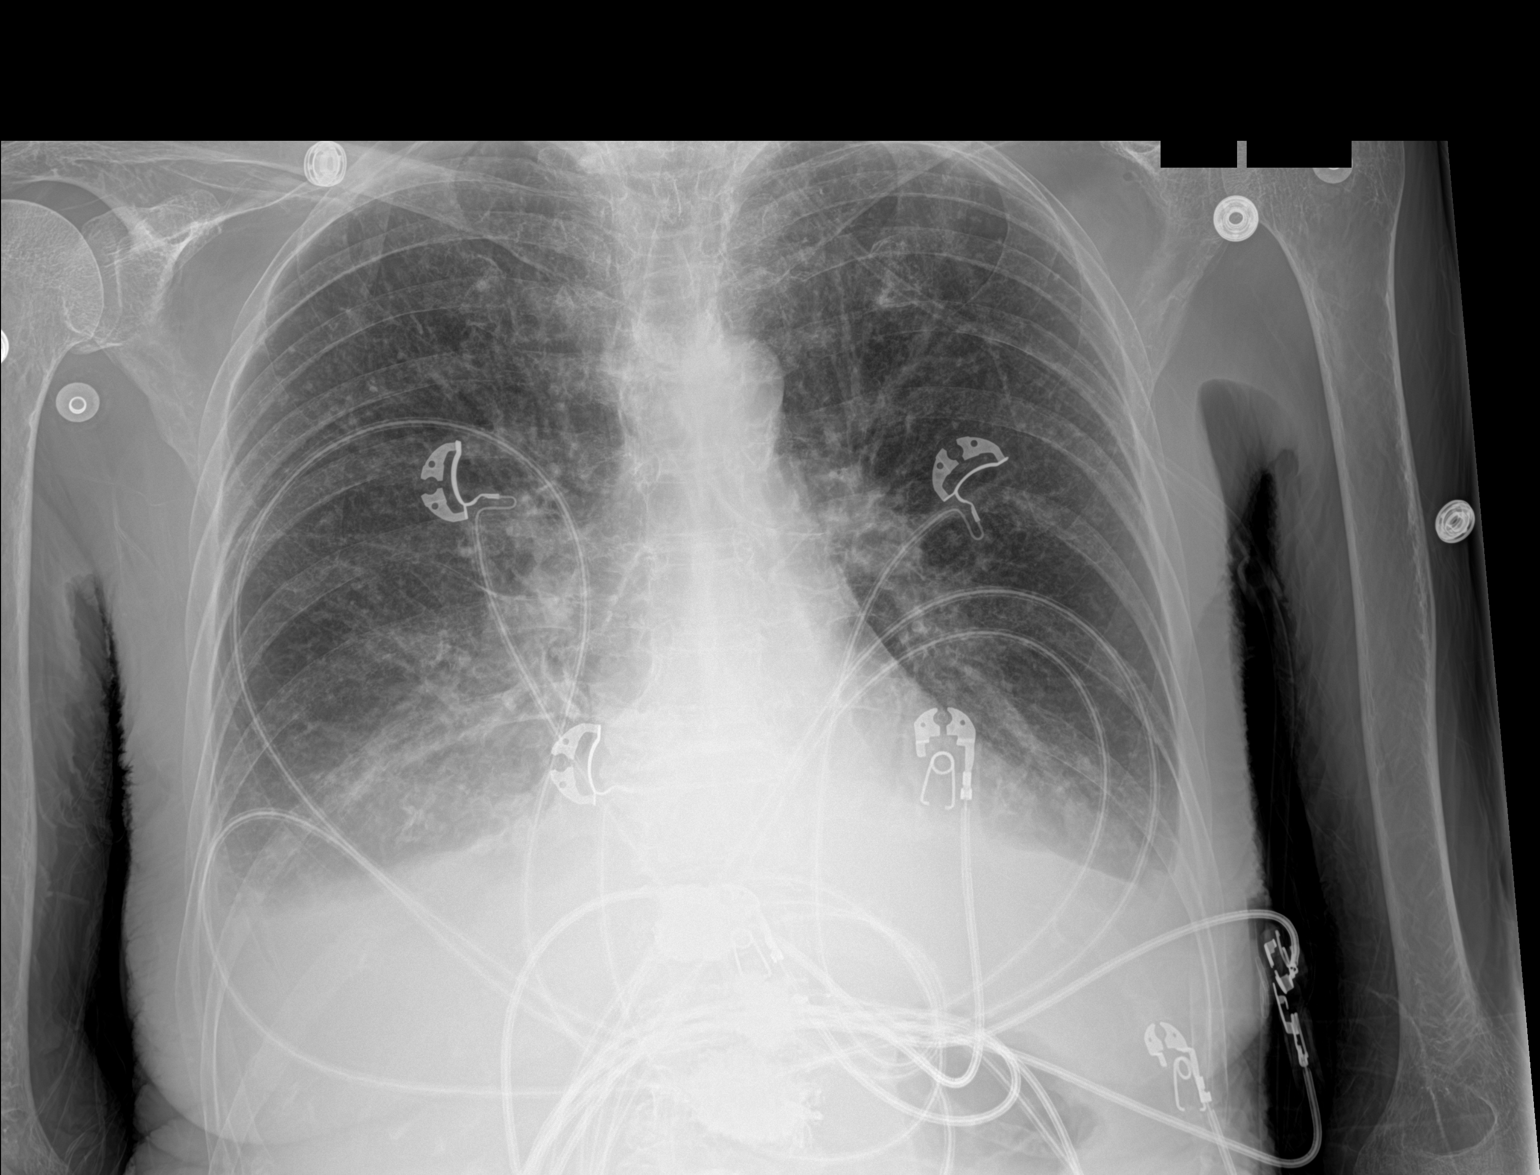

[1 of 1 positions shown; findings below may reference images not displayed]

FINDINGS: Single frontal view of the chest demonstrates a stable cardiac
silhouette. Chronic central vascular congestion. Bibasilar veiling
opacities are unchanged, consistent with effusions and/or basilar
consolidation. No pneumothorax. No acute bony abnormalities.
IMPRESSION: 1. Bibasilar veiling opacities consistent with effusions and/or
lower lobe consolidation.
2. Stable central vascular congestion.

## 2022-05-06 IMAGING — CT CT ANGIO CHEST
2 of 7 series · 18 of 46 positions shown · IV contrast (agent unspecified)
Comparison: [DATE], [DATE]

CLINICAL DATA: Short of breath, cough, hypoxia

EXAM:
CT ANGIOGRAPHY CHEST WITH CONTRAST
TECHNIQUE: Multidetector CT imaging of the chest was performed using the
standard protocol during bolus administration of intravenous
contrast. Multiplanar CT image reconstructions and MIPs were
obtained to evaluate the vascular anatomy.

[Series 8: thins · axial · 0.64mm/px · z∈[-519,-241]mm · 15 of 314 slices shown]
[im 18/314  lung]
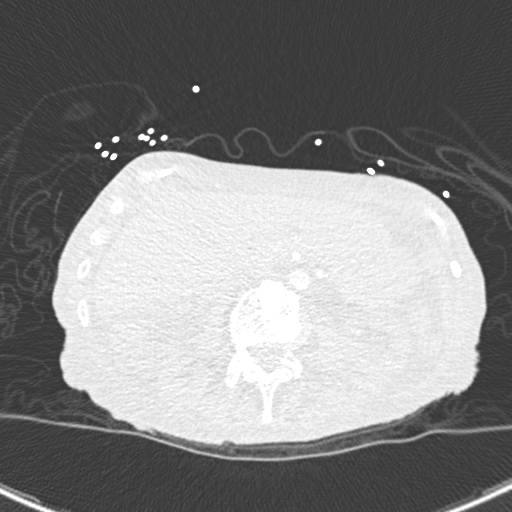
[im 35/314  soft-tissue]
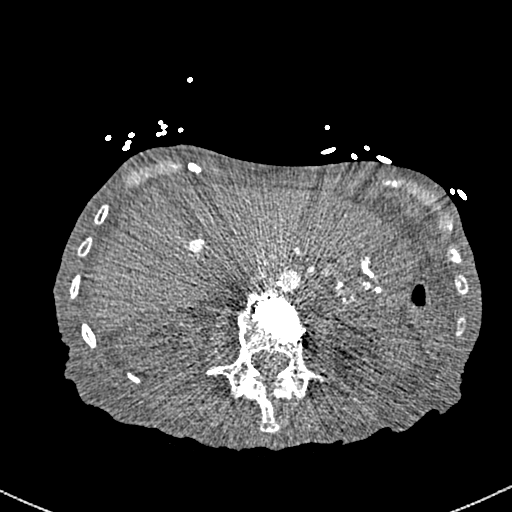
[im 53/314  lung]
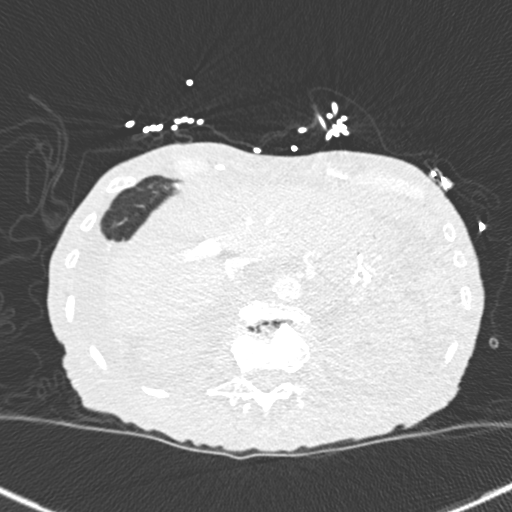
[im 70/314  soft-tissue]
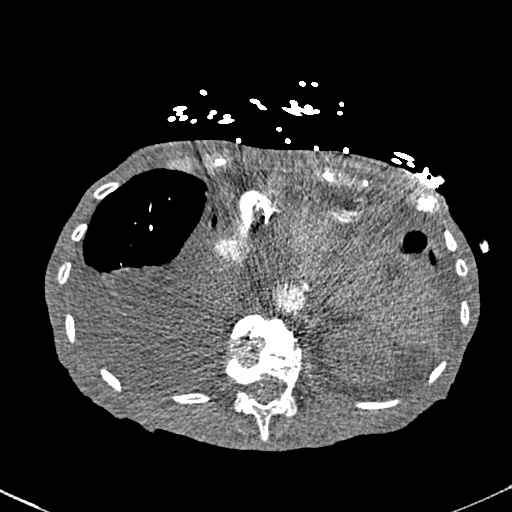
[im 105/314  lung]
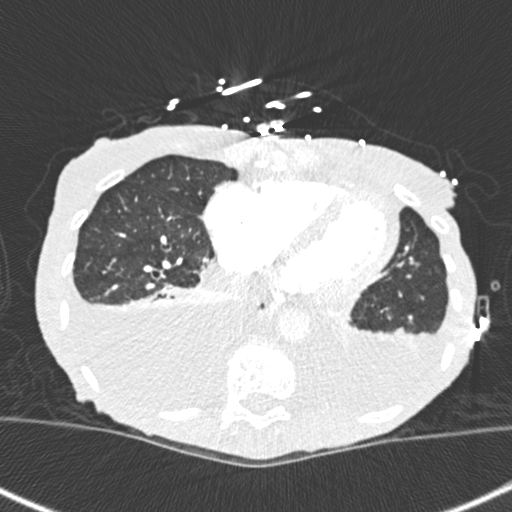
[im 122/314  soft-tissue]
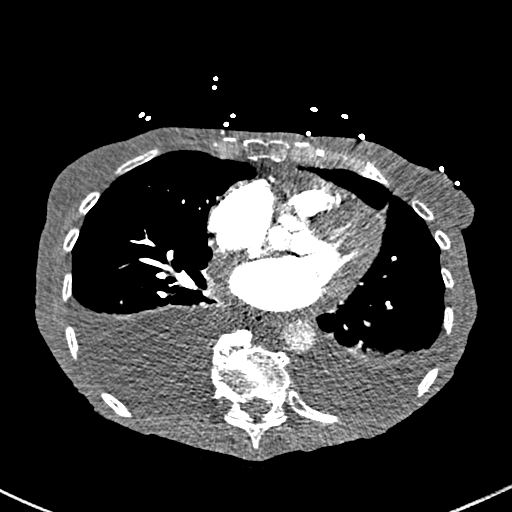
[im 140/314  lung]
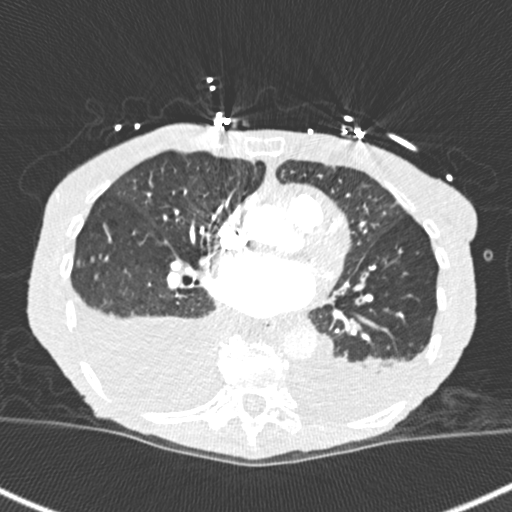
[im 157/314  soft-tissue]
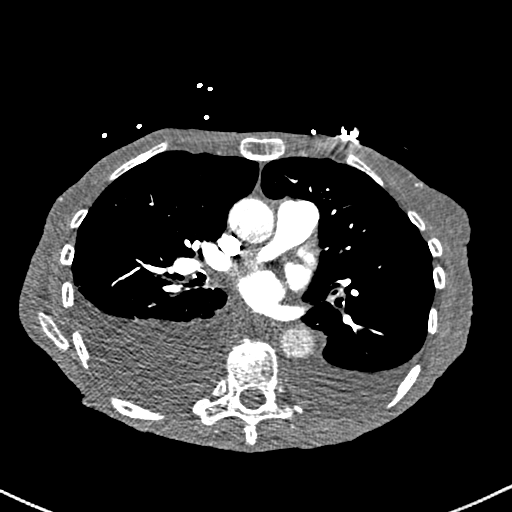
[im 174/314  lung]
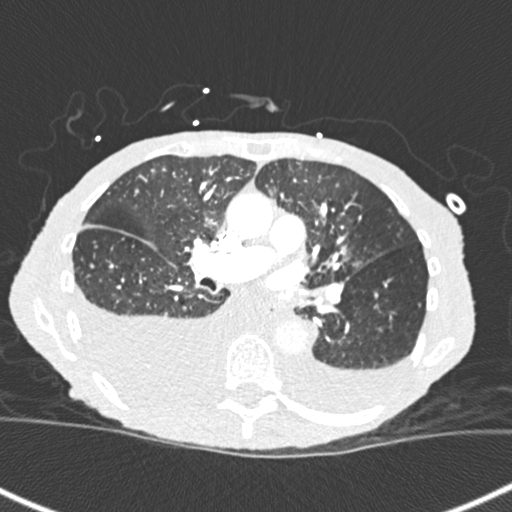
[im 192/314  soft-tissue]
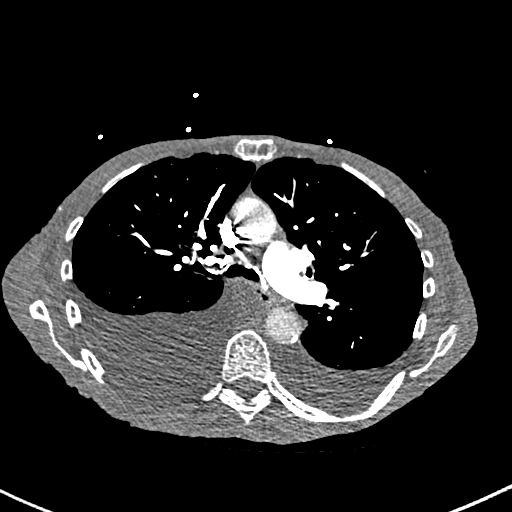
[im 209/314  lung]
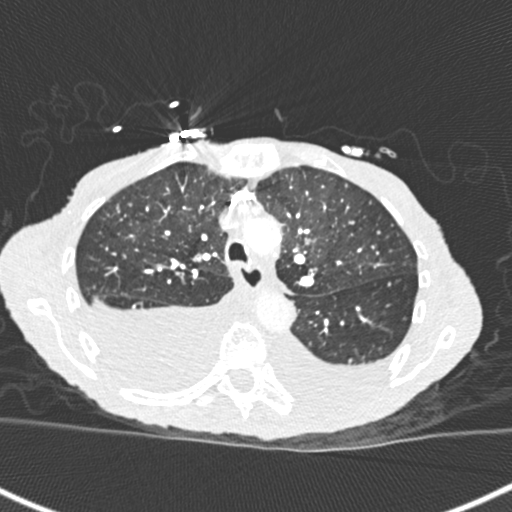
[im 244/314  soft-tissue]
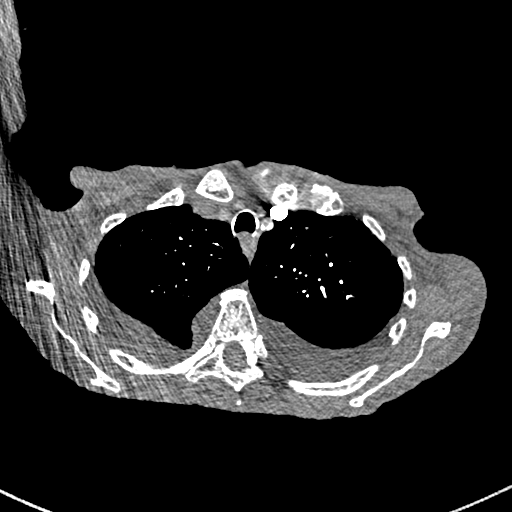
[im 261/314  lung]
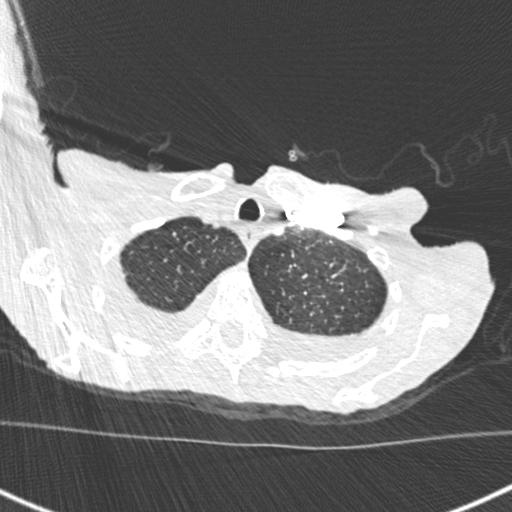
[im 279/314  soft-tissue]
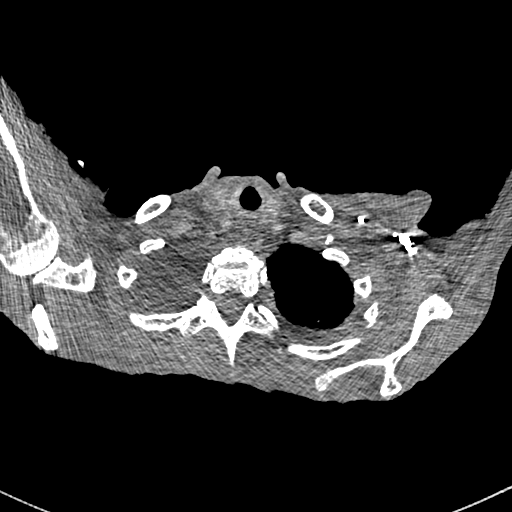
[im 296/314  lung]
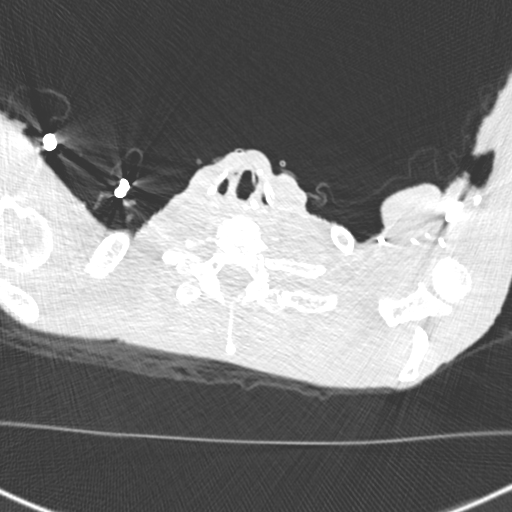

[Series 10: coronal mpr · coronal · 0.66mm/px · 3 of 109 slices shown]
[im 28/109  soft-tissue]
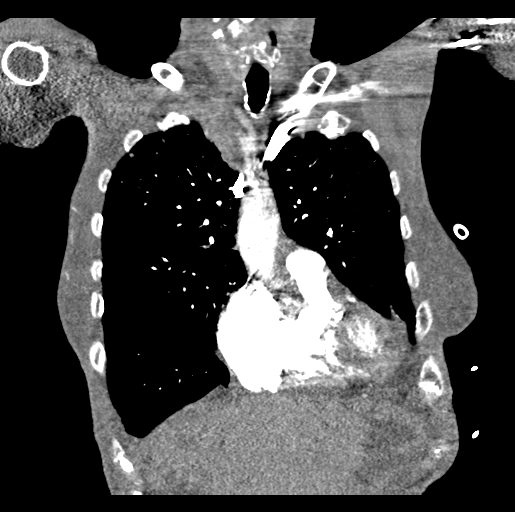
[im 55/109  soft-tissue]
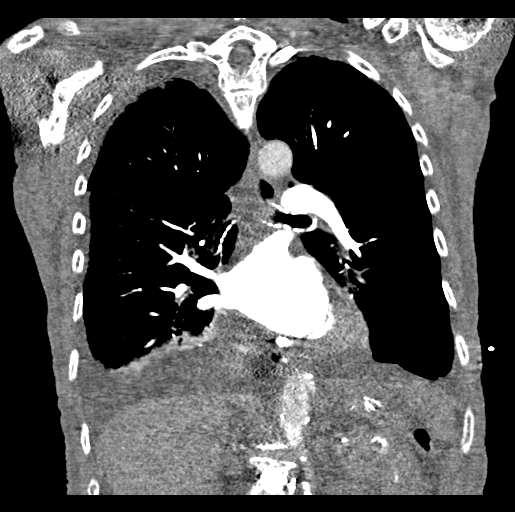
[im 82/109  soft-tissue]
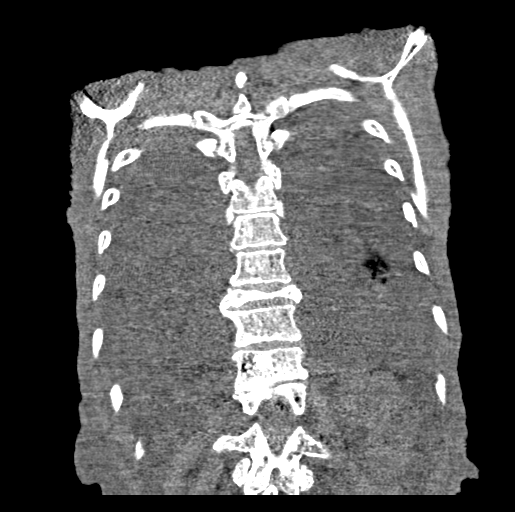

[18 of 46 positions shown; findings below may reference images not displayed]

RADIATION DOSE REDUCTION: This exam was performed according to the
departmental dose-optimization program which includes automated
exposure control, adjustment of the mA and/or kV according to
patient size and/or use of iterative reconstruction technique.

CONTRAST:  55mL OMNIPAQUE IOHEXOL 350 MG/ML SOLN
FINDINGS: Cardiovascular: This is a technically adequate evaluation of the
pulmonary vasculature. No filling defects or pulmonary emboli.

The heart is not enlarged. No pericardial effusion. Continued
biatrial dilation. Normal caliber of the thoracic aorta. Stable
atherosclerosis of the aorta and coronary vasculature.

Mediastinum/Nodes: No enlarged mediastinal, hilar, or axillary lymph
nodes. Thyroid gland, trachea, and esophagus demonstrate no
significant findings.

Lungs/Pleura: Moderate bilateral pleural effusions, right greater
than left, not appreciably changed. Dependent lower lobe atelectasis
is again noted. No acute airspace disease or pneumothorax.
Innumerable sub 3 mm bilateral pulmonary nodules are again seen,
likely post infectious or post inflammatory and benign given
long-term stability. Central airways are patent.

Upper Abdomen: Diffuse mesenteric and retroperitoneal edema. No
acute findings.

Musculoskeletal: Patient is cachectic. There is diffuse subcutaneous
edema consistent with anasarca. Stable vertebra plana at T10.
Chronic compression deformities at T5, T12, L1, L2, and L3 are again
noted with evidence of vertebral augmentation. Mottled lucency of
the manubrium with stable fracture likely sequela of known multiple
myeloma. No new fractures. Reconstructed images demonstrate no
additional findings.

Review of the MIP images confirms the above findings.
IMPRESSION: 1. No evidence of pulmonary embolus.
2. Stable moderate bilateral pleural effusions and dependent lower
lobe atelectasis, right greater than left.
3. Stable biatrial dilatation.
4. Pathologic manubrial fracture consistent with underlying multiple
myeloma. Multiple thoracolumbar compression deformities and
vertebral augmentation as above.
5. Innumerable sub 3 mm bilateral pulmonary nodules, likely sequela
of prior infection or inflammation and benign given long-term
stability.
6. Aortic Atherosclerosis ([A0]-[A0]). Coronary artery
atherosclerosis.

## 2022-05-06 MED ORDER — ONDANSETRON HCL 4 MG/2ML IJ SOLN: 4.0000 mg | Freq: Four times a day (QID) | INTRAMUSCULAR | Status: DC | PRN

## 2022-05-06 MED ORDER — ENOXAPARIN SODIUM 40 MG/0.4ML IJ SOSY
40.0000 mg | PREFILLED_SYRINGE | INTRAMUSCULAR | Status: DC
  Administered 2022-05-07 – 2022-05-11 (×5): 40 mg via SUBCUTANEOUS
  Filled 2022-05-06 (×5): qty 0.4

## 2022-05-06 MED ORDER — POTASSIUM CHLORIDE CRYS ER 20 MEQ PO TBCR
30.0000 meq | EXTENDED_RELEASE_TABLET | Freq: Once | ORAL | Status: AC
  Administered 2022-05-06: 30 meq via ORAL
  Filled 2022-05-06: qty 1

## 2022-05-06 MED ORDER — SODIUM CHLORIDE 0.9% FLUSH: 3.0000 mL | INTRAVENOUS | Status: DC | PRN

## 2022-05-06 MED ORDER — FUROSEMIDE 10 MG/ML IJ SOLN
40.0000 mg | Freq: Once | INTRAMUSCULAR | Status: AC
  Administered 2022-05-06: 40 mg via INTRAVENOUS
  Filled 2022-05-06: qty 4

## 2022-05-06 MED ORDER — SODIUM CHLORIDE 0.9% FLUSH
3.0000 mL | Freq: Two times a day (BID) | INTRAVENOUS | Status: DC
  Administered 2022-05-07 – 2022-05-11 (×8): 3 mL via INTRAVENOUS

## 2022-05-06 MED ORDER — SODIUM CHLORIDE 0.9 % IV SOLN: 250.0000 mL | INTRAVENOUS | Status: DC | PRN

## 2022-05-06 MED ORDER — MAGNESIUM SULFATE IN D5W 1-5 GM/100ML-% IV SOLN
1.0000 g | Freq: Once | INTRAVENOUS | Status: AC
  Administered 2022-05-06: 1 g via INTRAVENOUS
  Filled 2022-05-06: qty 100

## 2022-05-06 MED ORDER — IOHEXOL 350 MG/ML SOLN
55.0000 mL | Freq: Once | INTRAVENOUS | Status: AC | PRN
  Administered 2022-05-06: 55 mL via INTRAVENOUS

## 2022-05-06 MED ORDER — ACETAMINOPHEN 325 MG PO TABS
650.0000 mg | ORAL_TABLET | ORAL | Status: DC | PRN
  Filled 2022-05-06: qty 2

## 2022-05-06 MED ORDER — FUROSEMIDE 10 MG/ML IJ SOLN
40.0000 mg | Freq: Every day | INTRAMUSCULAR | Status: DC
  Administered 2022-05-07 – 2022-05-09 (×3): 40 mg via INTRAVENOUS
  Filled 2022-05-06 (×3): qty 4
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
## 2022-05-06 NOTE — Assessment & Plan Note (Addendum)
Mod PAH, EF 50-55% as of March. ?Pt with known light chain amyloidosis disease. ?Not sure if shes had confirmed cardiac amyloid yet though? ?1. CHF pathway ?2. Lasix '40mg'$  IV x1 in ED then '40mg'$  IV daily ?3. BMP daily ?4. Strict intake and output ?5. 2 admits in 2 months, ? Cardiac amyloid? probably time to get the CHF team involved I think, likely warrants cards consult in AM, will send message to p.trent. ?1. Not previously known to have cardiac amyloid, previous TEE was negative for this per early march office note from onc... ?2. Will also curbside cards fellow and see if they want me to order any tests in the meantime. ?1. Cards fellow actually wants to see tonight, could be cardiac amyloid or could just be diastolic dysfunction. ?

## 2022-05-06 NOTE — Assessment & Plan Note (Signed)
Cont home BP meds ?

## 2022-05-06 NOTE — ED Triage Notes (Signed)
Pt BIB [facility] from [facility]. Per EMS, Pt was laying flat in bed\, and had an episode of SOB where her sat was in the 80s on RA. Fire department had her on 4L, pt's O2 has been able to maintain in the 90s en route. '125mg'$  solumedrol given by EMS. Pt is A&O X4.  ?

## 2022-05-06 NOTE — Assessment & Plan Note (Addendum)
Following outpt with oncology ?Pathologic fx of manubrium ?

## 2022-05-06 NOTE — Assessment & Plan Note (Addendum)
Acute hypoxic resp failure, needing 4L O2 to maintain sats. ?Believed to be due to CHF / fluid overload given: ?1) B pleural effusions once again as in March admit ?2) BNP elevation ?

## 2022-05-06 NOTE — Assessment & Plan Note (Signed)
Following with oncology. 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 ?Not previously known to have cardiac involvement. ?MAB therapy, looks like most recent dose was in March. ?

## 2022-05-06 NOTE — ED Provider Notes (Signed)
?[facility] ?Provider Note ? ? ?CSN: [id number] ?Arrival date & time: 05/06/22  1619 ? ?  ? ?History ? ?Chief Complaint  ?Patient presents with  ? Shortness of Breath  ? ? ?Wendy [name] is a 76 y.o. female. ? ?HPI ?Patient presents with shortness of breath.  Medical history includes HTN, arthritis, chronic constipation, CAD, GERD, chronic back pain, HLD, anxiety, multiple myeloma, history of DVT, seizures, and CVA.  She had a 9-day hospitalization 2 months ago.  At that time, she had respiratory distress, hypoxic respiratory failure, altered mental status, and was intubated.  She was found to have multiple acute small brain infarcts.  She was discharged to skilled nursing facility.  Since her discharge, she has come off of blood thinners.  She currently does not take Lasix.  She is not on oxygen at baseline.  She states that she has had progressive shortness of breath over the past several days.  Currently, she is working with physical therapy, but has not gotten back to walking yet.  Earlier today, she was laid flat and this caused an acute worsening of her shortness of breath.  When EMS came on scene, patient was hypoxic.  She was sat up and placed on supplemental oxygen.  He was noted to have diffuse Rales throughout her lungs on auscultation.  She was given 125 of Solu-Medrol.  She does report improved symptoms with supplemental oxygen. ?  ? ?Home Medications ?Prior to Admission medications   ?Medication Sig Start Date End Date Taking? Authorizing Provider  ?acetaminophen (TYLENOL) 325 MG tablet Take 2 tablets (650 mg total) by mouth every 6 (six) hours as needed for mild pain, fever or headache. 03/20/21   Cherene Altes, MD  ?acyclovir (ZOVIRAX) 400 MG tablet TAKE ONE TABLET BY MOUTH TWICE DAILY 01/15/22   Orson Slick, MD  ?albuterol (VENTOLIN HFA) 108 (90 Base) MCG/ACT inhaler Inhale 2 puffs by mouth into the lungs every 6 hours as needed for wheezing or  shortness of breath 09/25/21   Orson Slick, MD  ?alclomethasone (ACLOVATE) 0.05 % ointment apply topically to the lips once daily as needed 12/05/21   Tower, Wynelle Fanny, MD  ?ALPRAZolam Duanne Moron) 0.5 MG tablet TAKE 1/2 TABLETS (0.25 MG TOTAL) BY MOUTH DAILY AS NEEDED FOR ANXIETY OR SLEEP. 11/27/21   Tower, Wynelle Fanny, MD  ?amLODipine (NORVASC) 5 MG tablet Take 1 tablet (5 mg total) by mouth daily. 03/19/22   Lorella Nimrod, MD  ?ascorbic acid (VITAMIN C) 500 MG tablet Take 1 tablet (500 mg total) by mouth 2 (two) times daily. 03/19/22   Lorella Nimrod, MD  ?aspirin 81 MG chewable tablet Chew 1 tablet (81 mg total) by mouth daily. 03/19/22   Lorella Nimrod, MD  ?atorvastatin (LIPITOR) 40 MG tablet Take 1 tablet (40 mg total) by mouth daily. 03/19/22   Lorella Nimrod, MD  ?Calcium Carbonate Antacid (TUMS PO) Take 2-4 capsules by mouth daily as needed (heartburn).    [provider]  ?Cholecalciferol (VITAMIN D) 2000 UNITS tablet Take 2,000 Units by mouth daily.    [provider]  ?clopidogrel (PLAVIX) 75 MG tablet Take 1 tablet (75 mg total) by mouth daily. 03/19/22   Lorella Nimrod, MD  ?diphenoxylate-atropine (LOMOTIL) 2.5-0.025 MG tablet Take 1 tablet by mouth 4 (four) times daily as needed for diarrhea or loose stools. 08/08/21   Orson Slick, MD  ?docusate sodium (COLACE) 100 MG capsule Take 1 capsule (100 mg  total) by mouth 2 (two) times daily. 03/19/22   Lorella Nimrod, MD  ?levETIRAcetam (KEPPRA) 100 MG/ML solution Take 5 mLs (500 mg total) by mouth 2 (two) times daily. 03/19/22   Lorella Nimrod, MD  ?Melatonin 10 MG TABS Take 10 mg by mouth at bedtime.    [provider]  ?metoprolol tartrate (LOPRESSOR) 25 MG tablet Take 1 tablet (25 mg total) by mouth 2 (two) times daily. 03/19/22   Lorella Nimrod, MD  ?Multiple Vitamin (MULTIVITAMIN WITH MINERALS) TABS tablet Take 1 tablet by mouth daily. 03/19/22   Lorella Nimrod, MD  ?pantoprazole sodium (PROTONIX) 40 mg Take 40 mg by mouth daily. 03/19/22    Lorella Nimrod, MD  ?polyethylene glycol (MIRALAX / GLYCOLAX) 17 g packet Take 17 g by mouth daily. 03/19/22   Lorella Nimrod, MD  ?Propylene Glycol (SYSTANE BALANCE OP) Place 1 drop into both eyes 2 (two) times daily.    [provider]  ?traMADol (ULTRAM) 50 MG tablet Take 1 tablet (50 mg total) by mouth every 12 (twelve) hours as needed for severe pain. 02/28/22   Tower, Wynelle Fanny, MD  ?vitamin B-12 1000 MCG tablet Take 1 tablet (1,000 mcg total) by mouth daily. 03/19/22   Lorella Nimrod, MD  ?zinc sulfate 220 (50 Zn) MG capsule Take 1 capsule (220 mg total) by mouth daily. 03/19/22   Lorella Nimrod, MD  ?zolpidem (AMBIEN) 10 MG tablet Take 1 tablet (10 mg total) by mouth at bedtime as needed. for sleep 02/26/22   Tower, Wynelle Fanny, MD  ?   ? ?Allergies    ?Bentyl [dicyclomine hcl], Butalbital-aspirin-caffeine, Clonidine derivatives, Linzess [linaclotide], Morphine and related, Motrin [ibuprofen], Penicillins, Sulfa antibiotics, Zanaflex [tizanidine hcl], and Diphenhydramine hcl   ? ?Review of Systems   ?Review of Systems  ?Respiratory:  Positive for cough and shortness of breath.   ?Cardiovascular:  Positive for leg swelling.  ?Musculoskeletal:  Positive for back pain (Chronic).  ?All other systems reviewed and are negative. ? ?Physical Exam ?Updated Vital Signs ?BP 121/69   Pulse 77   Temp 98.1 ?F (36.7 ?C) (Oral)   Resp 19   SpO2 100%  ?Physical Exam ?Vitals and nursing note reviewed.  ?Constitutional:   ?   General: She is not in acute distress. ?   Appearance: Normal appearance. She is well-developed. She is ill-appearing (Chronically). She is not toxic-appearing or diaphoretic.  ?HENT:  ?   Head: Normocephalic and atraumatic.  ?   Right Ear: External ear normal.  ?   Left Ear: External ear normal.  ?   Nose: Nose normal.  ?   Mouth/Throat:  ?   Mouth: Mucous membranes are moist.  ?   Pharynx: Oropharynx is clear.  ?Eyes:  ?   Extraocular Movements: Extraocular movements intact.  ?   Conjunctiva/sclera:  Conjunctivae normal.  ?Cardiovascular:  ?   Rate and Rhythm: Normal rate and regular rhythm.  ?   Heart sounds: No murmur heard. ?Pulmonary:  ?   Effort: Pulmonary effort is normal. No respiratory distress.  ?   Breath sounds: Examination of the right-lower field reveals decreased breath sounds. Examination of the left-lower field reveals decreased breath sounds. Decreased breath sounds and rales present.  ?Chest:  ?   Chest wall: No tenderness.  ?Abdominal:  ?   Palpations: Abdomen is soft.  ?   Tenderness: There is no abdominal tenderness.  ?Musculoskeletal:     ?   General: No swelling or deformity.  ?   Cervical back:  Normal range of motion and neck supple. No rigidity.  ?   Right lower leg: Edema present.  ?   Left lower leg: Edema present.  ?Skin: ?   General: Skin is warm and dry.  ?   Capillary Refill: Capillary refill takes less than 2 seconds.  ?   Coloration: Skin is not jaundiced or pale.  ?Neurological:  ?   General: No focal deficit present.  ?   Mental Status: She is alert and oriented to person, place, and time.  ?Psychiatric:     ?   Mood and Affect: Mood normal.     ?   Behavior: Behavior normal.     ?   Thought Content: Thought content normal.     ?   Judgment: Judgment normal.  ? ? ?ED Results / Procedures / Treatments   ?Labs ?(all labs ordered are listed, but only abnormal results are displayed) ?Labs Reviewed  ?COMPREHENSIVE METABOLIC PANEL - Abnormal; Notable for the following components:  ?    Result Value  ? Sodium 134 (*)   ? Glucose, Bld 130 (*)   ? Calcium 8.4 (*)   ? Total Protein 5.6 (*)   ? Albumin 2.7 (*)   ? Alkaline Phosphatase 286 (*)   ? All other components within normal limits  ?CBC WITH DIFFERENTIAL/PLATELET - Abnormal; Notable for the following components:  ? RBC 3.72 (*)   ? Hemoglobin 11.2 (*)   ? HCT 34.3 (*)   ? Platelets 496 (*)   ? Neutro Abs 8.4 (*)   ? All other components within normal limits  ?BRAIN NATRIURETIC PEPTIDE - Abnormal; Notable for the following  components:  ? B Natriuretic Peptide 2,404.2 (*)   ? All other components within normal limits  ?I-STAT VENOUS BLOOD GAS, ED - Abnormal; Notable for the following components:  ? pO2, Ven 117 (*)   ? Bicarbonate 29.1 (*)   ? Sodium
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
## 2022-05-07 ENCOUNTER — Encounter (HOSPITAL_COMMUNITY): Payer: Self-pay | Admitting: Internal Medicine

## 2022-05-07 ENCOUNTER — Observation Stay (HOSPITAL_COMMUNITY): Payer: Medicare Other

## 2022-05-07 DIAGNOSIS — Z20822 Contact with and (suspected) exposure to covid-19: Secondary | ICD-10-CM | POA: Diagnosis present

## 2022-05-07 DIAGNOSIS — Z885 Allergy status to narcotic agent status: Secondary | ICD-10-CM | POA: Diagnosis not present

## 2022-05-07 DIAGNOSIS — Z880 Allergy status to penicillin: Secondary | ICD-10-CM | POA: Diagnosis not present

## 2022-05-07 DIAGNOSIS — J918 Pleural effusion in other conditions classified elsewhere: Secondary | ICD-10-CM | POA: Diagnosis present

## 2022-05-07 DIAGNOSIS — I5033 Acute on chronic diastolic (congestive) heart failure: Secondary | ICD-10-CM | POA: Diagnosis present

## 2022-05-07 DIAGNOSIS — I10 Essential (primary) hypertension: Secondary | ICD-10-CM | POA: Diagnosis not present

## 2022-05-07 DIAGNOSIS — E871 Hypo-osmolality and hyponatremia: Secondary | ICD-10-CM | POA: Diagnosis present

## 2022-05-07 DIAGNOSIS — Z888 Allergy status to other drugs, medicaments and biological substances status: Secondary | ICD-10-CM | POA: Diagnosis not present

## 2022-05-07 DIAGNOSIS — M8458XA Pathological fracture in neoplastic disease, other specified site, initial encounter for fracture: Secondary | ICD-10-CM | POA: Diagnosis present

## 2022-05-07 DIAGNOSIS — C9000 Multiple myeloma not having achieved remission: Secondary | ICD-10-CM | POA: Diagnosis present

## 2022-05-07 DIAGNOSIS — I509 Heart failure, unspecified: Secondary | ICD-10-CM

## 2022-05-07 DIAGNOSIS — Z8249 Family history of ischemic heart disease and other diseases of the circulatory system: Secondary | ICD-10-CM | POA: Diagnosis not present

## 2022-05-07 DIAGNOSIS — Z8673 Personal history of transient ischemic attack (TIA), and cerebral infarction without residual deficits: Secondary | ICD-10-CM | POA: Diagnosis not present

## 2022-05-07 DIAGNOSIS — E43 Unspecified severe protein-calorie malnutrition: Secondary | ICD-10-CM | POA: Diagnosis present

## 2022-05-07 DIAGNOSIS — Z886 Allergy status to analgesic agent status: Secondary | ICD-10-CM | POA: Diagnosis not present

## 2022-05-07 DIAGNOSIS — Z79899 Other long term (current) drug therapy: Secondary | ICD-10-CM | POA: Diagnosis not present

## 2022-05-07 DIAGNOSIS — J9601 Acute respiratory failure with hypoxia: Secondary | ICD-10-CM | POA: Diagnosis present

## 2022-05-07 DIAGNOSIS — Z87891 Personal history of nicotine dependence: Secondary | ICD-10-CM | POA: Diagnosis not present

## 2022-05-07 DIAGNOSIS — M810 Age-related osteoporosis without current pathological fracture: Secondary | ICD-10-CM | POA: Diagnosis present

## 2022-05-07 DIAGNOSIS — E8581 Light chain (AL) amyloidosis: Secondary | ICD-10-CM | POA: Diagnosis present

## 2022-05-07 DIAGNOSIS — I110 Hypertensive heart disease with heart failure: Secondary | ICD-10-CM | POA: Diagnosis present

## 2022-05-07 DIAGNOSIS — I361 Nonrheumatic tricuspid (valve) insufficiency: Secondary | ICD-10-CM | POA: Diagnosis not present

## 2022-05-07 DIAGNOSIS — J90 Pleural effusion, not elsewhere classified: Secondary | ICD-10-CM | POA: Diagnosis not present

## 2022-05-07 DIAGNOSIS — K219 Gastro-esophageal reflux disease without esophagitis: Secondary | ICD-10-CM | POA: Diagnosis present

## 2022-05-07 DIAGNOSIS — Z66 Do not resuscitate: Secondary | ICD-10-CM | POA: Diagnosis present

## 2022-05-07 DIAGNOSIS — Z681 Body mass index (BMI) 19 or less, adult: Secondary | ICD-10-CM | POA: Diagnosis not present

## 2022-05-07 DIAGNOSIS — Z882 Allergy status to sulfonamides status: Secondary | ICD-10-CM | POA: Diagnosis not present

## 2022-05-07 DIAGNOSIS — G40909 Epilepsy, unspecified, not intractable, without status epilepticus: Secondary | ICD-10-CM | POA: Diagnosis present

## 2022-05-07 LAB — BASIC METABOLIC PANEL
Anion gap: 10 (ref 5–15)
BUN: 12 mg/dL (ref 8–23)
CO2: 25 mmol/L (ref 22–32)
Calcium: 8.2 mg/dL — ABNORMAL LOW (ref 8.9–10.3)
Chloride: 100 mmol/L (ref 98–111)
Creatinine, Ser: 0.78 mg/dL (ref 0.44–1.00)
GFR, Estimated: >60 mL/min (ref >60)
Glucose, Bld: 170 mg/dL — ABNORMAL HIGH (ref 70–99)
Potassium: 4.4 mmol/L (ref 3.5–5.1)
Sodium: 135 mmol/L (ref 135–145)

## 2022-05-07 LAB — ECHOCARDIOGRAM LIMITED
AR max vel: 1.63 cm2
AV Peak grad: 9 mmHg
Ao pk vel: 1.5 m/s
Area-P 1/2: 4.57 cm2
Calc EF: 63.8 %
MV M vel: 4.12 m/s
MV Peak grad: 67.9 mmHg
S' Lateral: 2.4 cm
Single Plane A2C EF: 63.2 %
Single Plane A4C EF: 63.3 %

## 2022-05-07 MED ORDER — SERTRALINE HCL 50 MG PO TABS
50.0000 mg | ORAL_TABLET | Freq: Every day | ORAL | Status: DC
Start: 2022-05-07 — End: 2022-05-11
  Administered 2022-05-07 – 2022-05-11 (×5): 50 mg via ORAL
  Filled 2022-05-07 (×6): qty 1

## 2022-05-07 MED ORDER — ATORVASTATIN CALCIUM 40 MG PO TABS
40.0000 mg | ORAL_TABLET | Freq: Every evening | ORAL | Status: DC
Start: 2022-05-07 — End: 2022-05-11
  Administered 2022-05-07 – 2022-05-10 (×4): 40 mg via ORAL
  Filled 2022-05-07 (×4): qty 1

## 2022-05-07 MED ORDER — POLYETHYLENE GLYCOL 3350 17 G PO PACK: 17.0000 g | PACK | Freq: Every day | ORAL | Status: DC | PRN

## 2022-05-07 MED ORDER — HYDRALAZINE HCL 20 MG/ML IJ SOLN: 10.0000 mg | INTRAMUSCULAR | Status: DC | PRN

## 2022-05-07 MED ORDER — MIRTAZAPINE 15 MG PO TABS
15.0000 mg | ORAL_TABLET | Freq: Every day | ORAL | Status: DC
  Administered 2022-05-09 – 2022-05-10 (×2): 15 mg via ORAL
  Filled 2022-05-07 (×3): qty 1

## 2022-05-07 MED ORDER — CLOPIDOGREL BISULFATE 75 MG PO TABS
75.0000 mg | ORAL_TABLET | Freq: Every day | ORAL | Status: DC
Start: 2022-05-07 — End: 2022-05-11
  Administered 2022-05-07 – 2022-05-11 (×5): 75 mg via ORAL
  Filled 2022-05-07 (×5): qty 1

## 2022-05-07 MED ORDER — DOCUSATE SODIUM 100 MG PO CAPS
100.0000 mg | ORAL_CAPSULE | Freq: Two times a day (BID) | ORAL | Status: DC
Start: 2022-05-07 — End: 2022-05-11
  Administered 2022-05-07 – 2022-05-11 (×4): 100 mg via ORAL
  Filled 2022-05-07 (×6): qty 1

## 2022-05-07 MED ORDER — CALCIUM CARBONATE ANTACID 500 MG PO CHEW
1.0000 | CHEWABLE_TABLET | ORAL | Status: DC | PRN
  Administered 2022-05-07: 200 mg via ORAL
  Filled 2022-05-07: qty 1

## 2022-05-07 MED ORDER — CALCIUM CARBONATE ANTACID 500 MG PO CHEW
2.0000 | CHEWABLE_TABLET | Freq: Two times a day (BID) | ORAL | Status: DC | PRN
  Administered 2022-05-07: 200 mg via ORAL
  Filled 2022-05-07: qty 2

## 2022-05-07 MED ORDER — IPRATROPIUM-ALBUTEROL 0.5-2.5 (3) MG/3ML IN SOLN
3.0000 mL | RESPIRATORY_TRACT | Status: DC | PRN
Start: 2022-05-07 — End: 2022-05-11

## 2022-05-07 MED ORDER — TRAZODONE HCL 50 MG PO TABS
50.0000 mg | ORAL_TABLET | Freq: Every evening | ORAL | Status: DC | PRN
Start: 2022-05-07 — End: 2022-05-11

## 2022-05-07 MED ORDER — PANTOPRAZOLE SODIUM 40 MG PO TBEC
40.0000 mg | DELAYED_RELEASE_TABLET | Freq: Every day | ORAL | Status: DC
  Administered 2022-05-07 – 2022-05-11 (×5): 40 mg via ORAL
  Filled 2022-05-07 (×5): qty 1

## 2022-05-07 MED ORDER — POLYVINYL ALCOHOL 1.4 % OP SOLN
1.0000 [drp] | Freq: Every day | OPHTHALMIC | Status: DC
  Administered 2022-05-08 – 2022-05-11 (×4): 1 [drp] via OPHTHALMIC
  Filled 2022-05-07: qty 15

## 2022-05-07 MED ORDER — METOPROLOL TARTRATE 5 MG/5ML IV SOLN: 5.0000 mg | INTRAVENOUS | Status: DC | PRN

## 2022-05-07 MED ORDER — ALBUTEROL SULFATE (2.5 MG/3ML) 0.083% IN NEBU: 3.0000 mL | INHALATION_SOLUTION | Freq: Four times a day (QID) | RESPIRATORY_TRACT | Status: DC | PRN

## 2022-05-07 MED ORDER — TRAMADOL HCL 50 MG PO TABS
50.0000 mg | ORAL_TABLET | Freq: Two times a day (BID) | ORAL | Status: DC | PRN
  Administered 2022-05-10: 50 mg via ORAL
  Filled 2022-05-07: qty 1

## 2022-05-07 MED ORDER — ALPRAZOLAM 0.25 MG PO TABS: 0.2500 mg | ORAL_TABLET | Freq: Two times a day (BID) | ORAL | Status: DC | PRN

## 2022-05-07 MED ORDER — ASPIRIN 81 MG PO CHEW
81.0000 mg | CHEWABLE_TABLET | Freq: Every day | ORAL | Status: DC
  Administered 2022-05-07 – 2022-05-11 (×5): 81 mg via ORAL
  Filled 2022-05-07 (×5): qty 1

## 2022-05-07 MED ORDER — AMLODIPINE BESYLATE 5 MG PO TABS
5.0000 mg | ORAL_TABLET | Freq: Every day | ORAL | Status: DC
Start: 2022-05-07 — End: 2022-05-08
  Administered 2022-05-07 – 2022-05-08 (×2): 5 mg via ORAL
  Filled 2022-05-07 (×2): qty 1

## 2022-05-07 MED ORDER — GUAIFENESIN 100 MG/5ML PO LIQD
5.0000 mL | ORAL | Status: DC | PRN
Start: 2022-05-07 — End: 2022-05-11
  Filled 2022-05-07: qty 5

## 2022-05-07 MED ORDER — ZOLPIDEM TARTRATE 5 MG PO TABS
5.0000 mg | ORAL_TABLET | Freq: Every evening | ORAL | Status: DC | PRN
  Administered 2022-05-07 – 2022-05-09 (×3): 5 mg via ORAL
  Filled 2022-05-07 (×3): qty 1

## 2022-05-07 MED ORDER — METOPROLOL TARTRATE 25 MG PO TABS
25.0000 mg | ORAL_TABLET | Freq: Two times a day (BID) | ORAL | Status: AC
  Administered 2022-05-07 – 2022-05-08 (×4): 25 mg via ORAL
  Filled 2022-05-07 (×4): qty 1

## 2022-05-07 MED ORDER — LEVETIRACETAM 100 MG/ML PO SOLN
500.0000 mg | Freq: Two times a day (BID) | ORAL | Status: DC
  Administered 2022-05-07 – 2022-05-10 (×8): 500 mg via ORAL
  Filled 2022-05-07 (×10): qty 5

## 2022-05-07 MED ORDER — MELATONIN 5 MG PO TABS
10.0000 mg | ORAL_TABLET | Freq: Every day | ORAL | Status: DC
  Administered 2022-05-07 – 2022-05-10 (×4): 10 mg via ORAL
  Filled 2022-05-07 (×4): qty 2

## 2022-05-07 NOTE — Evaluation (Signed)
Physical Therapy Evaluation ?Patient Details ?Name: Wendy [name]. [place] ?MRN: [id number] ?DOB: 1946/01/07 ?Today's Date: 05/07/2022 ? ?History of Present Illness ? Pt is a 76 y/o female admitted secondary to CHF exacerbation. PMH includes HTN, multiple myeloma, CVA, DVT, and hep A.  ?Clinical Impression ? Pt admitted secondary to problem above with deficits below. Pt requiring min to mod A to transfer to chair this session. Mild posterior lean noted. Pt reports she is currently at ALF and had started PT/OT. Recommend continuing PT at d/c to address current deficits. Will continue to follow acutely.    ?   ? ?Recommendations for follow up therapy are one component of a multi-disciplinary discharge planning process, led by the attending physician.  Recommendations may be updated based on patient status, additional functional criteria and insurance authorization. ? ?Follow Up Recommendations Home health PT (resume PT at ALF) ? ?  ?Assistance Recommended at Discharge Frequent or constant Supervision/Assistance  ?Patient can return home with the following ? A little help with walking and/or transfers;A little help with bathing/dressing/bathroom;Assistance with cooking/housework;Help with stairs or ramp for entrance;Assist for transportation;Direct supervision/assist for financial management;Direct supervision/assist for medications management ? ?  ?Equipment Recommendations None recommended by PT  ?Recommendations for Other Services ?    ?  ?Functional Status Assessment Patient has had a recent decline in their functional status and demonstrates the ability to make significant improvements in function in a reasonable and predictable amount of time.  ? ?  ?Precautions / Restrictions Precautions ?Precautions: Fall ?Restrictions ?Weight Bearing Restrictions: No  ? ?  ? ?Mobility ? Bed Mobility ?Overal bed mobility: Needs Assistance ?Bed Mobility: Supine to Sit ?  ?  ?Supine to sit: Min assist ?  ?  ?General bed mobility comments:  Min A for trunk and LE assist. Required assist with scooting hips forward ?  ? ?Transfers ?Overall transfer level: Needs assistance ?Equipment used: 1 person hand held assist ?Transfers: Sit to/from Stand, Bed to chair/wheelchair/BSC ?Sit to Stand: Min assist ?Stand pivot transfers: Min assist, Mod assist ?  ?  ?  ?  ?General transfer comment: Had pt to hold to PT arms to transfer. Min A to stand and min to light mod for balance during transfer to chair. ?  ? ?Ambulation/Gait ?  ?  ?  ?  ?  ?  ?  ?  ? ?Stairs ?  ?  ?  ?  ?  ? ?Wheelchair Mobility ?  ? ?Modified Rankin (Stroke Patients Only) ?  ? ?  ? ?Balance Overall balance assessment: Needs assistance ?Sitting-balance support: No upper extremity supported, Feet supported ?Sitting balance-Leahy Scale: Fair ?  ?  ?Standing balance support: Bilateral upper extremity supported ?Standing balance-Leahy Scale: Poor ?Standing balance comment: Reliant on BUE support and external support ?  ?  ?  ?  ?  ?  ?  ?  ?  ?  ?  ?   ? ? ? ?Pertinent Vitals/Pain Pain Assessment ?Pain Assessment: No/denies pain  ? ? ?Home Living Family/patient expects to be discharged to:: Assisted living ?Living Arrangements: Alone ?  ?  ?  ?  ?  ?  ?  ?Home Equipment: Conservation officer, nature (2 wheels);Wheelchair - manual ?Additional Comments: [place]  ?  ?Prior Function Prior Level of Function : Needs assist ?  ?  ?  ?  ?  ?  ?Mobility Comments: Staff assists with transfers to [facility] and short distance ambulation ?ADLs Comments: ASsists with ADL tasks ?  ? ? ?Hand  Dominance  ?   ? ?  ?Extremity/Trunk Assessment  ? Upper Extremity Assessment ?Upper Extremity Assessment: Defer to OT evaluation ?  ? ?Lower Extremity Assessment ?Lower Extremity Assessment: Generalized weakness (bilateral foot drop) ?  ? ?Cervical / Trunk Assessment ?Cervical / Trunk Assessment: Kyphotic  ?Communication  ? Communication: No difficulties  ?Cognition Arousal/Alertness: Awake/alert ?Behavior During Therapy: [facility] for tasks  assessed/performed ?Overall Cognitive Status: No family/caregiver present to determine baseline cognitive functioning ?  ?  ?  ?  ?  ?  ?  ?  ?  ?  ?  ?  ?  ?  ?  ?  ?  ?  ?  ? ?  ?General Comments   ? ?  ?Exercises    ? ?Assessment/Plan  ?  ?PT Assessment Patient needs continued PT services  ?PT Problem List Decreased strength;Decreased activity tolerance;Decreased balance;Decreased mobility ? ?   ?  ?PT Treatment Interventions DME instruction;Gait training;Functional mobility training;Therapeutic activities;Therapeutic exercise;Balance training;Patient/family education   ? ?PT Goals (Current goals can be found in the Care Plan section)  ?Acute Rehab PT Goals ?Patient Stated Goal: to get stronger ?PT Goal Formulation: With patient ?Time For Goal Achievement: 05/21/22 ?Potential to Achieve Goals: Good ? ?  ?Frequency Min 3X/week ?  ? ? ?Co-evaluation   ?  ?  ?  ?  ? ? ?  ?AM-PAC PT "6 Clicks" Mobility  ?Outcome Measure Help needed turning from your back to your side while in a flat bed without using bedrails?: A Little ?Help needed moving from lying on your back to sitting on the side of a flat bed without using bedrails?: A Little ?Help needed moving to and from a bed to a chair (including a wheelchair)?: A Lot ?Help needed standing up from a chair using your arms (e.g., wheelchair or bedside chair)?: A Little ?Help needed to walk in hospital room?: A Lot ?Help needed climbing 3-5 steps with a railing? : A Lot ?6 Click Score: 15 ? ?  ?End of Session   ?Activity Tolerance: Patient tolerated treatment well ?Patient left: in chair;with call bell/phone within reach;with nursing/sitter in room ?Nurse Communication: Mobility status ?PT Visit Diagnosis: Unsteadiness on feet (R26.81);Muscle weakness (generalized) (M62.81);Difficulty in walking, not elsewhere classified (R26.2) ?  ? ?Time: 7858-8502 ?PT Time Calculation (min) (ACUTE ONLY): 18 min ? ? ?Charges:   PT Evaluation ?$PT Eval Low Complexity: 1 Low ?  ?  ?    ? ? ?Reuel Derby, PT, DPT  ?Acute Rehabilitation Services  ?Pager: [phone] ?Office: [phone] ? ? ?[place] ?05/07/2022, 1:25 PM ? ?

## 2022-05-07 NOTE — Progress Notes (Signed)
? ?Progress Note ? ?Patient Name: Wendy [name] ?Date of Encounter: 05/07/2022 ? ?[facility] Cardiologist: None  ? ?Subjective  ? ?Reports dyspnea improved.   ? ?Inpatient Medications  ?  ?Scheduled Meds: ? amLODipine  5 mg Oral Daily  ? aspirin  81 mg Oral Daily  ? atorvastatin  40 mg Oral QPM  ? clopidogrel  75 mg Oral Daily  ? docusate sodium  100 mg Oral BID  ? enoxaparin (LOVENOX) injection  40 mg Subcutaneous Q24H  ? furosemide  40 mg Intravenous Daily  ? levETIRAcetam  500 mg Oral BID  ? melatonin  10 mg Oral QHS  ? metoprolol tartrate  25 mg Oral BID  ? mirtazapine  15 mg Oral QHS  ? pantoprazole  40 mg Oral Daily  ? polyvinyl alcohol  1 drop Both Eyes Daily  ? sertraline  50 mg Oral Daily  ? sodium chloride flush  3 mL Intravenous Q12H  ? ?Continuous Infusions: ? sodium chloride    ? ?PRN Meds: ?sodium chloride, acetaminophen, ALPRAZolam, guaiFENesin, hydrALAZINE, ipratropium-albuterol, metoprolol tartrate, ondansetron (ZOFRAN) IV, polyethylene glycol, sodium chloride flush, traMADol, traZODone, zolpidem  ? ?Vital Signs  ?  ?Vitals:  ? 05/07/22 0530 05/07/22 0600 05/07/22 0746 05/07/22 0749  ?BP: 121/67 117/67    ?Pulse: 74 72 83   ?Resp: _0 ?Temp:   (!) 97.4 ?F (36.3 ?C)   ?TempSrc:   Oral   ?SpO2: 100% 100% 100% 100%  ? ? ?Intake/Output Summary (Last 24 hours) at 05/07/2022 0814 ?Last data filed at 05/06/2022 2233 ?Gross per 24 hour  ?Intake 100 ml  ?Output --  ?Net 100 ml  ? ? ?  03/19/2022  ?  3:55 AM 03/18/2022  ?  5:00 AM 03/17/2022  ?  5:00 AM  ?Last 3 Weights  ?Weight (lbs) 111 lb 1.8 oz 111 lb 15.9 oz 105 lb 6.1 oz  ?Weight (kg) 50.4 kg 50.8 kg 47.8 kg  ?   ? ?Telemetry  ?  ?Normal sinus rhythm in 80s- Personally Reviewed ? ?ECG  ?  ?No new EKG- Personally Reviewed ? ?Physical Exam  ? ?GEN: No acute distress.   ?Neck: + JVD ?Cardiac: RRR, no murmurs, rubs, or gallops.  ?Respiratory: Diminished at bases ?GI: Soft, nontender, non-distended  ?MS: 1+ edema; ?Neuro:  Nonfocal  ?Psych: Normal  affect  ? ?Labs  ?  ?High Sensitivity Troponin:   ?Recent Labs  ?Lab 05/06/22 ?1649 05/06/22 ?1838  ?TROPONINIHS 30* 31*  ?   ?Chemistry ?Recent Labs  ?Lab 05/06/22 ?1649 05/06/22 ?1721 05/07/22 ?0415  ?NA 134* 134* 135  ?K 3.8 3.8 4.4  ?CL 100  --  100  ?CO2 26  --  25  ?GLUCOSE 130*  --  170*  ?BUN 12  --  12  ?CREATININE 0.70  --  0.78  ?CALCIUM 8.4*  --  8.2*  ?MG 1.8  --   --   ?PROT 5.6*  --   --   ?ALBUMIN 2.7*  --   --   ?AST 28  --   --   ?ALT 21  --   --   ?ALKPHOS 286*  --   --   ?BILITOT 0.9  --   --   ?GFRNONAA >60  --  >60  ?ANIONGAP 8  --  10  ?  ?Lipids No results for input(s): CHOL, TRIG, HDL, LABVLDL, LDLCALC, CHOLHDL in the last 168 hours.  ?Hematology ?Recent Labs  ?Lab 05/06/22 ?1649 05/06/22 ?  1721  ?WBC 10.3  --   ?RBC 3.72*  --   ?HGB 11.2* 11.9*  ?HCT 34.3* 35.0*  ?MCV 92.2  --   ?MCH 30.1  --   ?MCHC 32.7  --   ?RDW 15.5  --   ?PLT 496*  --   ? ?Thyroid No results for input(s): TSH, FREET4 in the last 168 hours.  ?BNP ?Recent Labs  ?Lab 05/06/22 ?1635  ?BNP 2,404.2*  ?  ?DDimer No results for input(s): DDIMER in the last 168 hours.  ? ?Radiology  ?  ?CT Angio Chest PE W and/or Wo Contrast ? ?Result Date: 05/06/2022 ?CLINICAL DATA:  Short of breath, cough, hypoxia EXAM: CT ANGIOGRAPHY CHEST WITH CONTRAST TECHNIQUE: Multidetector CT imaging of the chest was performed using the standard protocol during bolus administration of intravenous contrast. Multiplanar CT image reconstructions and MIPs were obtained to evaluate the vascular anatomy. RADIATION DOSE REDUCTION: This exam was performed according to the departmental dose-optimization program which includes automated exposure control, adjustment of the mA and/or kV according to patient size and/or use of iterative reconstruction technique. CONTRAST:  72m OMNIPAQUE IOHEXOL 350 MG/ML SOLN COMPARISON:  05/06/2022, 03/11/2022 FINDINGS: Cardiovascular: This is a technically adequate evaluation of the pulmonary vasculature. No filling defects or  pulmonary emboli. The heart is not enlarged. No pericardial effusion. Continued biatrial dilation. Normal caliber of the thoracic aorta. Stable atherosclerosis of the aorta and coronary vasculature. Mediastinum/Nodes: No enlarged mediastinal, hilar, or axillary lymph nodes. Thyroid gland, trachea, and esophagus demonstrate no significant findings. Lungs/Pleura: Moderate bilateral pleural effusions, right greater than left, not appreciably changed. Dependent lower lobe atelectasis is again noted. No acute airspace disease or pneumothorax. Innumerable sub 3 mm bilateral pulmonary nodules are again seen, likely post infectious or post inflammatory and benign given long-term stability. Central airways are patent. Upper Abdomen: Diffuse mesenteric and retroperitoneal edema. No acute findings. Musculoskeletal: Patient is cachectic. There is diffuse subcutaneous edema consistent with anasarca. Stable vertebra plana at T10. Chronic compression deformities at T5, T12, L1, L2, and L3 are again noted with evidence of vertebral augmentation. Mottled lucency of the manubrium with stable fracture likely sequela of known multiple myeloma. No new fractures. Reconstructed images demonstrate no additional findings. Review of the MIP images confirms the above findings. IMPRESSION: 1. No evidence of pulmonary embolus. 2. Stable moderate bilateral pleural effusions and dependent lower lobe atelectasis, right greater than left. 3. Stable biatrial dilatation. 4. Pathologic manubrial fracture consistent with underlying multiple myeloma. Multiple thoracolumbar compression deformities and vertebral augmentation as above. 5. Innumerable sub 3 mm bilateral pulmonary nodules, likely sequela of prior infection or inflammation and benign given long-term stability. 6. Aortic Atherosclerosis (ICD10-I70.0). Coronary artery atherosclerosis. Electronically Signed   By: MRanda NgoM.D.   On: 05/06/2022 20:07  ? ?DG Chest Port 1 View ? ?Result Date:  05/06/2022 ?CLINICAL DATA:  Short of breath, hypoxia EXAM: PORTABLE CHEST 1 VIEW COMPARISON:  03/14/2022 FINDINGS: Single frontal view of the chest demonstrates a stable cardiac silhouette. Chronic central vascular congestion. Bibasilar veiling opacities are unchanged, consistent with effusions and/or basilar consolidation. No pneumothorax. No acute bony abnormalities. IMPRESSION: 1. Bibasilar veiling opacities consistent with effusions and/or lower lobe consolidation. 2. Stable central vascular congestion. Electronically Signed   By: MRanda NgoM.D.   On: 05/06/2022 17:22   ? ?Cardiac Studies  ? ? ? ?Patient Profile  ?   ?76y.o. female with history of AL amyloidosis, CAD, hypertension, DVT, seizures, CVA who we are consulted for evaluation of  suspected heart failure ? ?Assessment & Plan  ?  ?Suspected heart failure: Presented with shortness of breath, initial SPO2 in 80s.  CTPA showed no PE, moderate bilateral pleural effusions.  BNP 2404.  Most recent echocardiogram 03/11/2022 showed EF 60 to 65%, mild LVH, moderate RV dysfunction, mild MR. ?-Strict I/O's and daily weight ?-Continue IV Lasix ?-Check limited echocardiogram to evaluate for systolic dysfunction ?-Given known AL amyloidosis, concerned for cardiac amyloid.  Plan cardiac MRI once more euvolemic ? ?Acute hypoxic respiratory failure: Required 4 L [place] on admission.  Likely due to heart failure as above. ? ?Troponin elevation: 30 > 31.  Not consistent with acute coronary syndrome.  Suspect due to demand ischemia in setting of decompensated heart failure as above. ? 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 ?Hypertension: On amlodipine 5 mg daily and metoprolol 25 mg twice daily ? ?CVA: In 02/2022.  On aspirin, Plavix, statin ? ?Hyperlipidemia: On atorvastatin 40 mg daily ? ?For questions or updates, please contact [place] ?Please consult [email or web address] for contact info under  ? ?  ?   ?Signed, ?Donato Heinz, MD   ?05/07/2022, 8:14 AM   ? ?

## 2022-05-07 NOTE — Evaluation (Addendum)
Occupational Therapy Evaluation ?Patient Details ?Name: Wendy [name]. Holz ?MRN: [id number] ?DOB: 1946/03/26 ?Today's Date: 05/07/2022 ? ? ?History of Present Illness Pt is a 76 y/o female admitted secondary to CHF exacerbation. PMH includes HTN, multiple myeloma, CVA, DVT, and hep A.  ? ?Clinical Impression ?  ?This 76 yo female with above presents to acute OT with focus of today's session to be evaluation, but pt's goal was to be able to text on her phone. We were successful in making this happen. Pt will continue to benefit from acute OT to focus on ADLs and any other functional goals she may have.  ?   ? ?Recommendations for follow up therapy are one component of a multi-disciplinary discharge planning process, led by the attending physician.  Recommendations may be updated based on patient status, additional functional criteria and insurance authorization.  ? ?Follow Up Recommendations ? Home health OT (at ALF)  ?  ?Assistance Recommended at Discharge Frequent or constant Supervision/Assistance  ?Patient can return home with the following A lot of help with walking and/or transfers;A lot of help with bathing/dressing/bathroom;Assist for transportation;Assistance with cooking/housework ? ?  ?Functional Status Assessment ? Patient has had a recent decline in their functional status and demonstrates the ability to make significant improvements in function in a reasonable and predictable amount of time.  ?Equipment Recommendations ? None recommended by OT  ?  ?   ?Precautions / Restrictions Precautions ?Precautions: Fall ?Restrictions ?Weight Bearing Restrictions: No  ? ?  ? ?   ?   ? ?ADL either performed or assessed with clinical judgement  ? ?ADL   ?  ?  ?  ?  ?  ?  ?  ?  ?  ?  ?  ?  ?  ?  ?  ?  ?  ?  ?  ?General ADL Comments: Pt reported she had just gotten back to bed and did feel up to getting up again just now. Then she said there was one thing she really wanted OT to help her work on---"texting on my phone has been  more difficult since my stroke". So that is what we did. We found out that if she turns her phone sideways (horizontal) that the keys are wider thus allowing her to hit the keys with increased accuracy but still at times her other fingers may get in the way. So I ended up providing her with her a stylus pen and this worked perfectly in addition to her using the word prediction option.  ? ? ? ?Vision Baseline Vision/History: 1 Wears glasses ?Ability to See in Adequate Light: 0 Adequate ?Patient Visual Report: No change from baseline ?   ?   ?   ?   ? ?Pertinent Vitals/Pain Pain Assessment ?Pain Assessment: No/denies pain  ? ? ? ?Hand Dominance Right ?  ?Extremity/Trunk Assessment Upper Extremity Assessment ?Upper Extremity Assessment: Generalized weakness ?  ? ?  ?Communication Communication ?Communication: No difficulties ?  ?Cognition Arousal/Alertness: Awake/alert ?Behavior During Therapy: [facility] for tasks assessed/performed ?Overall Cognitive Status: Within Functional Limits for tasks assessed ?  ?  ?  ?  ?  ?  ?  ?  ?  ?  ?  ?  ?  ?  ?  ?  ?  ?  ?  ?   ?   ?   ? ? ?Home Living Family/patient expects to be discharged to:: Assisted living ?Living Arrangements: Alone ?  ?  ?  ?  ?  ?  ?  ?  ?  ?  ?  ?  ?  ?  Home Equipment: Conservation officer, nature (2 wheels);Wheelchair - manual ?  ?Additional Comments: [facility] ?  ? ?  ?Prior Functioning/Environment Prior Level of Function : Needs assist ?  ?  ?  ?  ?  ?  ?Mobility Comments: Staff assists with transfers to [facility] and short distance ambulation ?ADLs Comments: ASsists with ADL tasks (reports she does about 1/2 and they do about 1/2) ?  ? ?  ?  ?OT Problem List: Impaired balance (sitting and/or standing);Decreased coordination ?  ?   ?OT Treatment/Interventions: Self-care/ADL training;DME and/or AE instruction;Patient/family education;Balance training  ?  ?OT Goals(Current goals can be found in the care plan section) Acute Rehab OT Goals ?Patient Stated Goal: to be able to text ?OT  Goal Formulation: With patient ?Time For Goal Achievement: 05/21/22 ?Potential to Achieve Goals: Good ?ADL Goals ?Pt Will Perform Grooming: with supervision;standing ?Pt Will Perform Lower Body Dressing: with supervision;sit to/from stand ?Pt Will Transfer to Toilet: with supervision;ambulating;bedside commode (over toilet) ?Pt Will Perform Toileting - Clothing Manipulation and hygiene: with supervision;sit to/from stand  ?OT Frequency: Min 2X/week ?  ? ?   ?   ?End of Session   ? ?Activity Tolerance: Patient tolerated treatment well ?Patient left: in bed;with call bell/phone within reach;with bed alarm set ? ?OT Visit Diagnosis: Other abnormalities of gait and mobility (R26.89);Unsteadiness on feet (R26.81);Muscle weakness (generalized) (M62.81) (per how she did with PT eariler and per her report of needing A)  ?              ?Time: 3567-0141 ?OT Time Calculation (min): 25 min ?Charges:  OT General Charges ?$OT Visit: 1 Visit ?OT Evaluation ?$OT Eval Moderate Complexity: 1 Mod ?OT Treatments ?$Therapeutic Activity: 8-22 mins ?[place], OTR/L ?Acute Rehab Services ?Pager [phone] ?Office [phone] ? ? ? ?Almon Register ?05/07/2022, 3:03 PM ?

## 2022-05-07 NOTE — Progress Notes (Signed)
Heart Failure Navigator Progress Note  Following this hospitalization to assess for HV TOC readiness.   Echo pending?  Vitor Overbaugh, BSN, RN Heart Failure Nurse Navigator Secure Chat Only  

## 2022-05-07 NOTE — Progress Notes (Signed)
?PROGRESS NOTE ? ? ? ?Wendy [name]  JKD:[id number] DOB: 01-28-1946 DOA: 05/06/2022 ?PCP: Abner Greenspan, MD  ? ?Brief Narrative:  ?76 year old with history of HTN, multiple myeloma, AL amyloidosis seeing outpatient oncology undergoing immunotherapy comes to the hospital with progressive dyspnea on exertion found to be in CHF.  Patient was admitted recently to the hospital for similar reason complicated by multifocal CVA. ? ? ?Assessment & Plan: ? Principal Problem: ?  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  Acute respiratory failure ([place]) ?  Light chain (AL) amyloidosis (H[facility]) ?  Essential hypertension ?  Multiple myeloma ([place]) ?  ? ? ?Assessment and Plan: ?* Acute on chronic heart failure with preserved ejection fraction (HFpEF) ([place]) ?Echocardiogram March 2023-EF 50%, PAH ?Pt with known light chain amyloidosis disease. ?-Cardiology team consulted, on IV Lasix daily. Plans for Cardiac MRI once euvolemic ?- Home meds-aspirin/Plavix, metoprolol twice daily ?be diastolic dysfunction. ? ?Acute respiratory failure ([place]) ?Moderate bilateral pleural effusion ?Stable bilateral pulmonary nodule ?-Secondary to CHF exacerbation ?- CTA chest negative for PE ? ?Light chain (AL) amyloidosis (HCC) ?Jearld Shines with outpatient oncology team ? ?Essential hypertension ?Cont home BP meds-Norvasc, metoprolol.  Additionally getting IV Lasix. ? ?Multiple myeloma ([place]) ?-Pathologic fracture of manubrium.  Follows outpatient oncology ? ?History of CVA ?- CVA in March.  Currently on aspirin Plavix and statin ? ?History of seizure ?- On Keppra 500 mg twice daily ? ?Severe protein calorie malnutrition ?- Dietitian consult ? ?DVT prophylaxis: Lovenox ?Code Status: Full code ?Family Communication:  Amy updated.  ? ?Patient is still hypoxic and volume overloaded requiring IV Lasix.  Maintain hospital stay. ? ?Subjective: ? ?SOB improved, O2 levels. No chest pain.   ? ? ?Examination: ? ?General exam: Appears calm and comfortable  ?Respiratory system: bibasilar crackles.  ?Cardiovascular system: S1 & S2 heard, RRR. No JVD, murmurs, rubs, gallops or clicks. No pedal edema. ?Gastrointestinal system: Abdomen is nondistended, soft and nontender. No organomegaly or masses felt. Normal bowel sounds heard. ?Central nervous system: Alert and oriented. No focal neurological deficits. ?Extremities: Symmetric 5 x 5 power. ?Skin: No rashes, lesions or ulcers ?Psychiatry: Judgement and insight appear normal. Mood & affect appropriate.  ? ? ? ?Objective: ?Vitals:  ? 05/07/22 0430 05/07/22 0500 05/07/22 0530 05/07/22 0600  ?BP: 109/63 117/66 121/67 117/67  ?Pulse: 77 79 74 72  ?Resp: _0 ?Temp:      ?TempSrc:      ?SpO2: 100% 100% 100% 100%  ? ? ?Intake/Output Summary (Last 24 hours) at 05/07/2022 0744 ?Last data filed at 05/06/2022 2233 ?Gross per 24 hour  ?Intake 100 ml  ?Output --  ?Net 100 ml  ? ?There were no vitals filed for this visit. ? ? ?Data Reviewed:  ? ?CBC: ?Recent Labs  ?Lab 05/06/22 ?1649 05/06/22 ?1721  ?WBC 10.3  --   ?NEUTROABS 8.4*  --   ?HGB 11.2* 11.9*  ?HCT 34.3* 35.0*  ?MCV 92.2  --   ?PLT 496*  --   ? ?Basic Metabolic Panel: ?Recent Labs  ?Lab 05/06/22 ?1649 05/06/22 ?1721 05/07/22 ?0415  ?NA 134* 134* 135  ?K 3.8 3.8 4.4  ?CL 100  --  100  ?CO2 26  --  25  ?GLUCOSE 130*  --  170*  ?BUN 12  --  12  ?CREATININE 0.70  --  0.78  ?CALCIUM 8.4*  --  8.2*  ?MG 1.8  --   --   ? ?  GFR: ?CrCl cannot be calculated (Unknown ideal weight.). ?Liver Function Tests: ?Recent Labs  ?Lab 05/06/22 ?1649  ?AST 28  ?ALT 21  ?ALKPHOS 286*  ?BILITOT 0.9  ?PROT 5.6*  ?ALBUMIN 2.7*  ? ?No results for input(s): LIPASE, AMYLASE in the last 168 hours. ?No results for input(s): AMMONIA in the last 168 hours. ?Coagulation Profile: ?No results for input(s): INR, PROTIME in the last 168 hours. ?Cardiac Enzymes: ?No results for input(s): CKTOTAL, CKMB, CKMBINDEX, TROPONINI in the last 168  hours. ?BNP (last 3 results) ?No results for input(s): PROBNP in the last 8760 hours. ?HbA1C: ?No results for input(s): HGBA1C in the last 72 hours. ?CBG: ?No results for input(s): GLUCAP in the last 168 hours. ?Lipid Profile: ?No results for input(s): CHOL, HDL, LDLCALC, TRIG, CHOLHDL, LDLDIRECT in the last 72 hours. ?Thyroid Function Tests: ?No results for input(s): TSH, T4TOTAL, FREET4, T3FREE, THYROIDAB in the last 72 hours. ?Anemia Panel: ?No results for input(s): VITAMINB12, FOLATE, FERRITIN, TIBC, IRON, RETICCTPCT in the last 72 hours. ?Sepsis Labs: ?No results for input(s): PROCALCITON, LATICACIDVEN in the last 168 hours. ? ?Recent Results (from the past 240 hour(s))  ?Resp Panel by RT-PCR (Flu A&B, Covid) Nasopharyngeal Swab     Status: None  ? Collection Time: 05/06/22  4:37 PM  ? Specimen: Nasopharyngeal Swab; Nasopharyngeal(NP) swabs in vial transport medium  ?Result Value Ref Range Status  ? SARS Coronavirus 2 by RT PCR NEGATIVE NEGATIVE Final  ?  Comment: (NOTE) ?SARS-CoV-2 target nucleic acids are NOT DETECTED. ? ?The SARS-CoV-2 RNA is generally detectable in upper respiratory ?specimens during the acute phase of infection. The lowest ?concentration of SARS-CoV-2 viral copies this assay can detect is ?138 copies/mL. A negative result does not preclude SARS-Cov-2 ?infection and should not be used as the sole basis for treatment or ?other patient management decisions. A negative result may occur with  ?improper specimen collection/handling, submission of specimen other ?than nasopharyngeal swab, presence of viral mutation(s) within the ?areas targeted by this assay, and inadequate number of viral ?copies(<138 copies/mL). A negative result must be combined with ?clinical observations, patient history, and epidemiological ?information. The expected result is Negative. ? ?Fact Sheet for Patients:  ?EntrepreneurPulse.com.au ? ?Fact Sheet for Healthcare Providers:   ?IncredibleEmployment.be ? ?This test is no t yet approved or cleared by the [place] FDA and  ?has been authorized for detection and/or diagnosis of SARS-CoV-2 by ?FDA under an Emergency Use Authorization (EUA). This EUA will remain  ?in effect (meaning this test can be used) for the duration of the ?COVID-19 declaration under Section 564(b)(1) of the Act, 21 ?U.S.C.section 360bbb-3(b)(1), unless the authorization is terminated  ?or revoked sooner.  ? ? ?  ? Influenza A by PCR NEGATIVE NEGATIVE Final  ? Influenza B by PCR NEGATIVE NEGATIVE Final  ?  Comment: (NOTE) ?The Xpert Xpress SARS-CoV-2/FLU/RSV plus assay is intended as an aid ?in the diagnosis of influenza from Nasopharyngeal swab specimens and ?should not be used as a sole basis for treatment. Nasal washings and ?aspirates are unacceptable for Xpert Xpress SARS-CoV-2/FLU/RSV ?testing. ? ?Fact Sheet for Patients: ?EntrepreneurPulse.com.au ? ?Fact Sheet for Healthcare Providers: ?IncredibleEmployment.be ? ?This test is not yet approved or cleared by the [place] FDA and ?has been authorized for detection and/or diagnosis of SARS-CoV-2 by ?FDA under an Emergency Use Authorization (EUA). This EUA will remain ?in effect (meaning this test can be used) for the duration of the ?COVID-19 declaration under Section 564(b)(1) of the Act, 21 U.S.C. ?section 360bbb-3(b)(1), unless  the authorization is terminated or ?revoked. ? ?Performed at [facility], [place] [address], [place], [place] ?[address] ?  ?  ? ? ? ? ? ?Radiology Studies: ?CT Angio Chest PE W and/or Wo Contrast ? ?Result Date: 05/06/2022 ?CLINICAL DATA:  Short of breath, cough, hypoxia EXAM: CT ANGIOGRAPHY CHEST WITH CONTRAST TECHNIQUE: Multidetector CT imaging of the chest was performed using the standard protocol during bolus administration of intravenous contrast. Multiplanar CT image reconstructions and MIPs were obtained to evaluate  the vascular anatomy. RADIATION DOSE REDUCTION: This exam was performed according to the departmental dose-optimization program which includes automated exposure control, adjustment of the mA and/or kV according to patient size and/or use of iterative reconstruc

## 2022-05-07 NOTE — ED Notes (Signed)
Breakfast order placed ?

## 2022-05-08 ENCOUNTER — Other Ambulatory Visit (HOSPITAL_COMMUNITY): Payer: Self-pay

## 2022-05-08 ENCOUNTER — Encounter (HOSPITAL_COMMUNITY): Payer: Self-pay | Admitting: Internal Medicine

## 2022-05-08 ENCOUNTER — Encounter: Payer: Self-pay | Admitting: Hematology and Oncology

## 2022-05-08 DIAGNOSIS — E8581 Light chain (AL) amyloidosis: Secondary | ICD-10-CM | POA: Diagnosis not present

## 2022-05-08 DIAGNOSIS — J9601 Acute respiratory failure with hypoxia: Secondary | ICD-10-CM | POA: Diagnosis not present

## 2022-05-08 DIAGNOSIS — I5033 Acute on chronic diastolic (congestive) heart failure: Secondary | ICD-10-CM | POA: Diagnosis not present

## 2022-05-08 LAB — BASIC METABOLIC PANEL
Anion gap: 10 (ref 5–15)
BUN: 13 mg/dL (ref 8–23)
CO2: 29 mmol/L (ref 22–32)
Calcium: 7.9 mg/dL — ABNORMAL LOW (ref 8.9–10.3)
Chloride: 94 mmol/L — ABNORMAL LOW (ref 98–111)
Creatinine, Ser: 0.77 mg/dL (ref 0.44–1.00)
GFR, Estimated: >60 mL/min (ref >60)
Glucose, Bld: 97 mg/dL (ref 70–99)
Potassium: 3.6 mmol/L (ref 3.5–5.1)
Sodium: 133 mmol/L — ABNORMAL LOW (ref 135–145)

## 2022-05-08 LAB — MAGNESIUM: Magnesium: 1.7 mg/dL (ref 1.7–2.4)

## 2022-05-08 MED ORDER — MAGNESIUM SULFATE 4 GM/100ML IV SOLN
4.0000 g | Freq: Once | INTRAVENOUS | Status: AC
  Administered 2022-05-08: 4 g via INTRAVENOUS
  Filled 2022-05-08: qty 100

## 2022-05-08 MED ORDER — BOOST / RESOURCE BREEZE PO LIQD CUSTOM
1.0000 | Freq: Three times a day (TID) | ORAL | Status: DC
  Administered 2022-05-08 – 2022-05-11 (×5): 1 via ORAL
  Filled 2022-05-08: qty 1

## 2022-05-08 MED ORDER — METOPROLOL SUCCINATE ER 50 MG PO TB24
50.0000 mg | ORAL_TABLET | Freq: Every day | ORAL | Status: DC
  Administered 2022-05-09 – 2022-05-11 (×2): 50 mg via ORAL
  Filled 2022-05-08 (×3): qty 1

## 2022-05-08 MED ORDER — MAGNESIUM OXIDE -MG SUPPLEMENT 400 (240 MG) MG PO TABS
800.0000 mg | ORAL_TABLET | Freq: Once | ORAL | Status: AC
  Administered 2022-05-08: 800 mg via ORAL
  Filled 2022-05-08: qty 2

## 2022-05-08 MED ORDER — LOSARTAN POTASSIUM 25 MG PO TABS
25.0000 mg | ORAL_TABLET | Freq: Every day | ORAL | Status: DC
  Administered 2022-05-09 – 2022-05-11 (×2): 25 mg via ORAL
  Filled 2022-05-08 (×3): qty 1

## 2022-05-08 MED ORDER — IPRATROPIUM-ALBUTEROL 0.5-2.5 (3) MG/3ML IN SOLN
3.0000 mL | Freq: Three times a day (TID) | RESPIRATORY_TRACT | Status: DC
  Administered 2022-05-08 – 2022-05-11 (×7): 3 mL via RESPIRATORY_TRACT
  Filled 2022-05-08 (×8): qty 3

## 2022-05-08 MED ORDER — IPRATROPIUM-ALBUTEROL 0.5-2.5 (3) MG/3ML IN SOLN
3.0000 mL | Freq: Four times a day (QID) | RESPIRATORY_TRACT | Status: DC
  Administered 2022-05-08: 3 mL via RESPIRATORY_TRACT
  Filled 2022-05-08: qty 3

## 2022-05-08 MED ORDER — POTASSIUM CHLORIDE CRYS ER 20 MEQ PO TBCR
40.0000 meq | EXTENDED_RELEASE_TABLET | Freq: Once | ORAL | Status: AC
Start: 2022-05-08 — End: 2022-05-08
  Administered 2022-05-08: 40 meq via ORAL
  Filled 2022-05-08: qty 2

## 2022-05-08 NOTE — Consult Note (Signed)
? ?  [facility] CM Inpatient Consult ? ? ?05/08/2022 ? ?Tinaya L. Burry ?23-Apr-1946 ?[id number] ? ?[facility] [ACO] Patient: Medicare ACO REACH ? ?Primary Care Provider:  Abner Greenspan, MD, [facility], is an embedded provider with a Chronic Care Management team and program, and is listed for the transition of care follow up and appointments. ? ?Patient was screened for Embedded practice service needs for chronic care management/care coordination.  Patient was recently referred for embedded care coordination from SNF transition. Patient is  from [facility] and is currently being recommended for [facility] PT/OT at ALF. ? ?Met with the patient at the bedside.  Patient endorses PCP, explain nature of visit.  Patient states her daughter is also a contact person for her. Patient states her plan is to return to [facility].  She denies any issues at this time. ? ?Plan: Continue to follow for disposition TOC needs for post hospital needs. ? ?Please contact for further questions, ? ?Natividad Brood, RN BSN CCM ?[facility] Liaison ? [phone] business mobile phone ?Toll free office [phone]  ?Fax number: [phone] ?[email or web address] ?[email or web address] ? ? ? ?

## 2022-05-08 NOTE — Progress Notes (Signed)
Initial Nutrition Assessment ? ?DOCUMENTATION CODES:  ? ?Underweight ? ?INTERVENTION:  ?Provide Boost Breeze po TID, each supplement provides 250 kcal and 9 grams of protein. ? ?Encourage adequate PO intake.  ? ?NUTRITION DIAGNOSIS:  ? ?Increased nutrient needs related to chronic illness (COPD) as evidenced by estimated needs. ? ?GOAL:  ? ?Patient will meet greater than or equal to 90% of their needs ? ?MONITOR:  ? ?PO intake, Supplement acceptance, Labs, Weight trends, Skin, I & O's ? ?REASON FOR ASSESSMENT:  ? ?Consult ?Assessment of nutrition requirement/status ? ?ASSESSMENT:  ? 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 Pt with acute on chronic heart failure. ? ?Pt unavailable, busy with nursing care at time of visit. Per previous RD note, 3/24, at last hospitalization pt reported to favor Boost Breeze. RD to order nutritional supplements to aid in caloric and protein needs. Current meal completion has been 30-100%. Unable to complete Nutrition-Focused physical exam at this time.  ?Labs and medications reviewed.  ? ?Diet Order:   ?Diet Order   ? ?       ?  Diet Heart Room service appropriate? Yes; Fluid consistency: Thin; Fluid restriction: 1500 mL Fluid  Diet effective now       ?  ? ?  ?  ? ?  ? ? ?EDUCATION NEEDS:  ? ?Not appropriate for education at this time ? ?Skin:  Skin Assessment: Reviewed RN Assessment ? ?Last BM:  5/15 ? ?Height:  ? ?Ht Readings from Last 1 Encounters:  ?05/07/22 _0  (1.575 m)  ? ? ?Weight:  ? ?Wt Readings from Last 1 Encounters:  ?05/08/22 45.7 kg  ? ?BMI:  Body mass index is 18.43 kg/m?. ? ?Estimated Nutritional Needs:  ? ?Kcal:  1500-1700 ? ?Protein:  70-85 grams ? ?Fluid:  1.5 L/day ? ?Corrin Parker, MS, RD, LDN ?RD pager number/after hours weekend pager number on Amion. ? ?

## 2022-05-08 NOTE — Progress Notes (Addendum)
? ?Progress Note ? ?Patient Name: Wendy [name]. Som ?Date of Encounter: 05/08/2022 ? ?[facility] Cardiologist: None  ? ?Subjective  ? ?Net -1.5 L yesterday.  BP 132/71.  Creatinine stable at 0.8.  She reports dyspnea has significantly improved ? ?Inpatient Medications  ?  ?Scheduled Meds: ? amLODipine  5 mg Oral Daily  ? aspirin  81 mg Oral Daily  ? atorvastatin  40 mg Oral QPM  ? clopidogrel  75 mg Oral Daily  ? docusate sodium  100 mg Oral BID  ? enoxaparin (LOVENOX) injection  40 mg Subcutaneous Q24H  ? furosemide  40 mg Intravenous Daily  ? Ipratropium-Albuterol  1 puff Inhalation Q6H  ? levETIRAcetam  500 mg Oral BID  ? melatonin  10 mg Oral QHS  ? metoprolol tartrate  25 mg Oral BID  ? mirtazapine  15 mg Oral QHS  ? pantoprazole  40 mg Oral Daily  ? polyvinyl alcohol  1 drop Both Eyes Daily  ? sertraline  50 mg Oral Daily  ? sodium chloride flush  3 mL Intravenous Q12H  ? ?Continuous Infusions: ? sodium chloride    ? ?PRN Meds: ?sodium chloride, acetaminophen, ALPRAZolam, calcium carbonate, guaiFENesin, hydrALAZINE, ipratropium-albuterol, metoprolol tartrate, ondansetron (ZOFRAN) IV, polyethylene glycol, sodium chloride flush, traMADol, traZODone, zolpidem  ? ?Vital Signs  ?  ?Vitals:  ? 05/08/22 0442 05/08/22 4481 05/08/22 0731 05/08/22 0948  ?BP: 138/73  120/68 132/71  ?Pulse: 84  85 90  ?Resp: 16  18   ?Temp: 98.1 ?F (36.7 ?C)  98.7 ?F (37.1 ?C)   ?TempSrc: Oral  Oral   ?SpO2: 99%  99%   ?Weight:  45.7 kg    ?Height:      ? ? ?Intake/Output Summary (Last 24 hours) at 05/08/2022 1009 ?Last data filed at 05/08/2022 0443 ?Gross per 24 hour  ?Intake 363 ml  ?Output 1950 ml  ?Net -1587 ml  ? ? ? ?  05/08/2022  ?  6:52 AM 05/07/2022  ?  3:55 PM 03/19/2022  ?  3:55 AM  ?Last 3 Weights  ?Weight (lbs) 100 lb 12 oz 100 lb 8.5 oz 111 lb 1.8 oz  ?Weight (kg) 45.7 kg 45.6 kg 50.4 kg  ?   ? ?Telemetry  ?  ?Normal sinus rhythm in 80s- Personally Reviewed ? ?ECG  ?  ?No new EKG- Personally Reviewed ? ?Physical Exam  ? ?GEN: No  acute distress.   ?Neck: No JVD ?Cardiac: RRR, no murmurs, rubs, or gallops.  ?Respiratory: CTAB ?GI: Soft, nontender, non-distended  ?MS: No edema; ?Neuro:  Nonfocal  ?Psych: Normal affect  ? ?Labs  ?  ?High Sensitivity Troponin:   ?Recent Labs  ?Lab 05/06/22 ?1649 05/06/22 ?1838  ?TROPONINIHS 30* 31*  ? ?   ?Chemistry ?Recent Labs  ?Lab 05/06/22 ?1649 05/06/22 ?1721 05/07/22 ?0415 05/08/22 ?0459  ?NA 134* 134* 135 133*  ?K 3.8 3.8 4.4 3.6  ?CL 100  --  100 94*  ?CO2 26  --  25 29  ?GLUCOSE 130*  --  170* 97  ?BUN 12  --  12 13  ?CREATININE 0.70  --  0.78 0.77  ?CALCIUM 8.4*  --  8.2* 7.9*  ?MG 1.8  --   --  1.7  ?PROT 5.6*  --   --   --   ?ALBUMIN 2.7*  --   --   --   ?AST 28  --   --   --   ?ALT 21  --   --   --   ?  ALKPHOS 286*  --   --   --   ?BILITOT 0.9  --   --   --   ?GFRNONAA >60  --  >60 >60  ?ANIONGAP 8  --  10 10  ? ?  ?Lipids No results for input(s): CHOL, TRIG, HDL, LABVLDL, LDLCALC, CHOLHDL in the last 168 hours.  ?Hematology ?Recent Labs  ?Lab 05/06/22 ?1649 05/06/22 ?1721  ?WBC 10.3  --   ?RBC 3.72*  --   ?HGB 11.2* 11.9*  ?HCT 34.3* 35.0*  ?MCV 92.2  --   ?MCH 30.1  --   ?MCHC 32.7  --   ?RDW 15.5  --   ?PLT 496*  --   ? ? ?Thyroid No results for input(s): TSH, FREET4 in the last 168 hours.  ?BNP ?Recent Labs  ?Lab 05/06/22 ?1635  ?BNP 2,404.2*  ? ?  ?DDimer No results for input(s): DDIMER in the last 168 hours.  ? ?Radiology  ?  ?CT Angio Chest PE W and/or Wo Contrast ? ?Result Date: 05/06/2022 ?CLINICAL DATA:  Short of breath, cough, hypoxia EXAM: CT ANGIOGRAPHY CHEST WITH CONTRAST TECHNIQUE: Multidetector CT imaging of the chest was performed using the standard protocol during bolus administration of intravenous contrast. Multiplanar CT image reconstructions and MIPs were obtained to evaluate the vascular anatomy. RADIATION DOSE REDUCTION: This exam was performed according to the departmental dose-optimization program which includes automated exposure control, adjustment of the mA and/or kV  according to patient size and/or use of iterative reconstruction technique. CONTRAST:  57m OMNIPAQUE IOHEXOL 350 MG/ML SOLN COMPARISON:  05/06/2022, 03/11/2022 FINDINGS: Cardiovascular: This is a technically adequate evaluation of the pulmonary vasculature. No filling defects or pulmonary emboli. The heart is not enlarged. No pericardial effusion. Continued biatrial dilation. Normal caliber of the thoracic aorta. Stable atherosclerosis of the aorta and coronary vasculature. Mediastinum/Nodes: No enlarged mediastinal, hilar, or axillary lymph nodes. Thyroid gland, trachea, and esophagus demonstrate no significant findings. Lungs/Pleura: Moderate bilateral pleural effusions, right greater than left, not appreciably changed. Dependent lower lobe atelectasis is again noted. No acute airspace disease or pneumothorax. Innumerable sub 3 mm bilateral pulmonary nodules are again seen, likely post infectious or post inflammatory and benign given long-term stability. Central airways are patent. Upper Abdomen: Diffuse mesenteric and retroperitoneal edema. No acute findings. Musculoskeletal: Patient is cachectic. There is diffuse subcutaneous edema consistent with anasarca. Stable vertebra plana at T10. Chronic compression deformities at T5, T12, L1, L2, and L3 are again noted with evidence of vertebral augmentation. Mottled lucency of the manubrium with stable fracture likely sequela of known multiple myeloma. No new fractures. Reconstructed images demonstrate no additional findings. Review of the MIP images confirms the above findings. IMPRESSION: 1. No evidence of pulmonary embolus. 2. Stable moderate bilateral pleural effusions and dependent lower lobe atelectasis, right greater than left. 3. Stable biatrial dilatation. 4. Pathologic manubrial fracture consistent with underlying multiple myeloma. Multiple thoracolumbar compression deformities and vertebral augmentation as above. 5. Innumerable sub 3 mm bilateral pulmonary  nodules, likely sequela of prior infection or inflammation and benign given long-term stability. 6. Aortic Atherosclerosis (ICD10-I70.0). Coronary artery atherosclerosis. Electronically Signed   By: MRanda NgoM.D.   On: 05/06/2022 20:07  ? ?DG Chest Port 1 View ? ?Result Date: 05/06/2022 ?CLINICAL DATA:  Short of breath, hypoxia EXAM: PORTABLE CHEST 1 VIEW COMPARISON:  03/14/2022 FINDINGS: Single frontal view of the chest demonstrates a stable cardiac silhouette. Chronic central vascular congestion. Bibasilar veiling opacities are unchanged, consistent with effusions and/or basilar consolidation. No pneumothorax.  No acute bony abnormalities. IMPRESSION: 1. Bibasilar veiling opacities consistent with effusions and/or lower lobe consolidation. 2. Stable central vascular congestion. Electronically Signed   By: Randa Ngo M.D.   On: 05/06/2022 17:22  ? ?ECHOCARDIOGRAM LIMITED ? ?Result Date: 05/07/2022 ?   ECHOCARDIOGRAM LIMITED REPORT   Patient Name:   Anistyn L. Stella Date of Exam: 05/07/2022 Medical Rec #:  [id number]     Height:       62.0 in Accession #:    [id number]    Weight:       111.1 lb Date of Birth:  [birth year]     BSA:          1.489 m? Patient Age:    76 years      BP:           117/67 mmHg Patient Gender: F             HR:           84 bpm. Exam Location:  Inpatient Procedure: 2D Echo, Limited Echo, Cardiac Doppler and Color Doppler Indications:    CHF  History:        Patient has prior history of Echocardiogram examinations. CAD;                 Risk Factors:Hypertension.  Sonographer:    Jyl Heinz Referring Phys: [id number] [place]  1. Left ventricular ejection fraction, by estimation, is 55 to 60%. The left ventricle has normal function. The left ventricle has no regional wall motion abnormalities. There is mild concentric left ventricular hypertrophy. Left ventricular diastolic parameters are consistent with Grade II diastolic dysfunction (pseudonormalization).  2. Right  ventricular systolic function is normal. The right ventricular size is normal. There is moderately elevated pulmonary artery systolic pressure.  3. Left atrial size was mildly dilated.  4. A small pericardi

## 2022-05-08 NOTE — Progress Notes (Signed)
Heart Failure Nurse Navigator Progress Note ? ?PCP: Tower, Wynelle Fanny, MD ?PCP-Cardiologist: [place] ?Admission Diagnosis: Acute on chronic congestive heart failure, Acute respiratory failure with hypoxia.  ?Admitted from: [facility] via EMS ? ?Presentation:   ?Wendy [name] presented with shortness of breath, leg swelling, cough,with extensive medical history,  since SNF admission, off blood thinners. BNP 2,404, Troponin 30, CTA negative for PE, lasix IV and O2 at 4 L.  ? ?Spoke and educated patient on sign and symptoms of heart failure, daily weights. States she takes all her medications that she is given, states SNF weighs her once per week, however will ask them to do daily weights, so she can watch her weight. Spoke about diet/ fluid restrictions, when to call her doctor or come to the ER. She states she currently using a wheel chair since her stroke in March and admission to [facility]. She is working with PT and wants to get more active. Patient stated she would like to come to [facility], will bring her daughter. Scheduled for 05/16/22 @ 2pm.  ? ?ECHO/ LVEF: 55-60% ? ?Clinical Course: ? ?Past Medical History:  ?Diagnosis Date  ? Allergy   ? Arthritis   ? Cataract   ? removed  ? Colon polyps   ? DNR (do not resuscitate) 04/30/2021  ? Foot fracture   ? with surgery  ? GERD (gastroesophageal reflux disease)   ? Hepatitis A   ? Viral - got better  ? History of miscarriage   ? Hypertension   ? Hyponatremia   ? Insomnia   ? Multiple myeloma not having achieved remission ([place])   ? Osteoporosis   ?  ? ?Social History  ? ?Socioeconomic History  ? Marital status: Divorced  ?  Spouse name: Not on file  ? Number of children: 1  ? Years of education: Not on file  ? Highest education level: Bachelor's degree (e.g., BA, AB, BS)  ?Occupational History  ? Occupation: Takes care of Toddlers  ?  Employer: RETIRED  ?Tobacco Use  ? Smoking status: Former  ?  Packs/day: 1.00  ?  Years: 32.00  ?  Pack years: 32.00  ?  Types: Cigarettes  ?   Quit date: 12/24/1993  ?  Years since quitting: 28.3  ? Smokeless tobacco: Never  ?Vaping Use  ? Vaping Use: Never used  ?Substance and Sexual Activity  ? Alcohol use: Not Currently  ?  Alcohol/week: 7.0 - 10.0 standard drinks  ?  Types: 7 - 10 Glasses of wine per week  ?  Comment: 1 glasses of wine per day, none in months  ? Drug use: No  ? Sexual activity: Never  ?Other Topics Concern  ? Not on file  ?Social History Narrative  ? Is divorced for years.  ? Is very active - - works on the house  ? Takes care of Toddlers  ? Twin grandsons - 11 months in [place].  ? Vegetarian  ? ?Social Determinants of Health  ? ?Financial Resource Strain: Low Risk   ? Difficulty of Paying Living Expenses: Not hard at all  ?Food Insecurity: No Food Insecurity  ? Worried About Charity fundraiser in the Last Year: Never true  ? Ran Out of Food in the Last Year: Never true  ?Transportation Needs: No Transportation Needs  ? Lack of Transportation (Medical): No  ? Lack of Transportation (Non-Medical): No  ?Physical Activity: Inactive  ? Days of Exercise per Week: 0 days  ? Minutes  of Exercise per Session: 0 min  ?Stress: No Stress Concern Present  ? Feeling of Stress : Not at all  ?Social Connections: Socially Isolated  ? Frequency of Communication with Friends and Family: Twice a week  ? Frequency of Social Gatherings with Friends and Family: Once a week  ? Attends Religious Services: Never  ? Active Member of Clubs or Organizations: No  ? Attends Archivist Meetings: Never  ? Marital Status: Never married  ? ?Education Assessment and Provision: ? ?Detailed education and instructions provided on heart failure disease management including the following: ? ?Signs and symptoms of Heart Failure ?When to call the physician ?Importance of daily weights ?Low sodium diet ?Fluid restriction ?Medication management ?Anticipated future follow-up appointments ? ?Patient education given on each of the above topics.  Patient acknowledges  understanding via teach back method and acceptance of all instructions. ? ?Education Materials:  "Living Better With Heart Failure" Booklet, HF zone tool, & Daily Weight Tracker Tool. ? ?Patient has scale at home: No, SNF ?Patient has pill box at home: NA   ?High Risk Criteria for Readmission and/or Poor Patient Outcomes: ?Heart failure hospital admissions (last 6 months): 2  ?No Show rate: 0 ?Difficult social situation: No ?Demonstrates medication adherence: yes ?Primary Language: English ?Literacy level: Reading, writing, and comprehension.  ? ?Barriers of Care:   ?Daily weights - lives @ a SNF ?Activity level - wheel chair ? ? ?Considerations/Referrals:  ? ?Referral made to Heart Failure Pharmacist Stewardship: yes ?Referral made to Heart Failure CSW/NCM TOC: No ?Referral made to Heart & Vascular TOC clinic: Yes , 05/16/22 ? ?Items for Follow-up on DC/TOC: ?Daily weights ?New medications? ? ? ? ?Earnestine Leys, BSN, RN ?Heart Failure Nurse Navigator ?Secure Chat Only   ?

## 2022-05-08 NOTE — Progress Notes (Signed)
?PROGRESS NOTE ? ? ? ?Wendy [name]  VFI:[id number] DOB: 05/10/1946 DOA: 05/06/2022 ?PCP: Abner Greenspan, MD  ? ?Brief Narrative:  ?76 year old with history of HTN, multiple myeloma, AL amyloidosis seeing outpatient oncology undergoing immunotherapy comes to the hospital with progressive dyspnea on exertion found to be in CHF.  Patient was admitted recently to the hospital for similar reason complicated by multifocal CVA.  Patient was started on IV diuretics with plans to get cardiac MRI once euvolemic. ? ? ?Assessment & Plan: ? Principal Problem: ?  Acute on chronic heart failure with preserved ejection fraction (HFpEF) ([place]) ?Active Problems: ?  Acute respiratory failure ([place]) ?  Light chain (AL) amyloidosis (H[facility]) ?  Essential hypertension ?  Multiple myeloma ([place]) ?  Acute heart failure ([place]) ?  ? ? ?Assessment and Plan: ?* Acute on chronic heart failure with preserved ejection fraction (HFpEF) ([place]) ?Echocardiogram March 2023-EF 50%, PAH ?Pt with known light chain amyloidosis disease. ?-Cardiology following, continue IV diuresis, once euvolemic plan will be to get cardiac MRI ?- Home meds-aspirin/Plavix, metoprolol twice daily ?be diastolic dysfunction. ? ?Acute respiratory failure ([place]) ?Moderate bilateral pleural effusion ?Stable bilateral pulmonary nodule ?-Secondary to CHF exacerbation ?- CTA chest negative for PE ? ?Light chain (AL) amyloidosis (HCC) ?-Follows with outpatient oncology team.  Cardiac MRI planned once euvolemic ? ?Essential hypertension ?Cont home BP meds-Norvasc, metoprolol.  Additionally getting IV Lasix. ? ?Multiple myeloma ([place]) ?-Pathologic fracture of manubrium.  Follows outpatient oncology ? ?History of CVA ?- CVA in March.  Currently on aspirin Plavix and statin ? ?History of seizure ?- On Keppra 500 mg twice daily ? ?Severe protein calorie malnutrition ?- Dietitian consult ? ?DVT prophylaxis: Lovenox ?Code Status: Full code ?Family Communication:  Amy updated  ? ?Patient is still  hypoxic and volume overloaded requiring IV Lasix.  Maintain hospital stay.  Once euvolemic, plan will be to get cardiac MRI ? ?Subjective: ?Breathing is better with diuresis. No acute events o/v.  ? ?Examination: ? ?Constitutional: Not in acute distress ?Respiratory: bibasilar crackles.  ?Cardiovascular: Normal sinus rhythm, no rubs ?Abdomen: Nontender nondistended good bowel sounds ?Musculoskeletal: No edema noted ?Skin: No rashes seen ?Neurologic: CN 2-12 grossly intact.  And nonfocal ?Psychiatric: Normal judgment and insight. Alert and oriented x 3. Normal mood.  ? ? ? ?Objective: ?Vitals:  ? 05/07/22 1934 05/07/22 2345 05/08/22 0442 05/08/22 4166  ?BP: 139/67 131/70 138/73   ?Pulse: 88 87 84   ?Resp: _0 ?Temp: 98.7 ?F (37.1 ?C) 97.7 ?F (36.5 ?C) 98.1 ?F (36.7 ?C)   ?TempSrc: Oral Oral Oral   ?SpO2: 92% 91% 99%   ?Weight:    45.7 kg  ?Height:      ? ? ?Intake/Output Summary (Last 24 hours) at 05/08/2022 0723 ?Last data filed at 05/08/2022 0443 ?Gross per 24 hour  ?Intake 483 ml  ?Output 1950 ml  ?Net -1467 ml  ? ?Filed Weights  ? 05/07/22 1555 05/08/22 0652  ?Weight: 45.6 kg 45.7 kg  ? ? ? ?Data Reviewed:  ? ?CBC: ?Recent Labs  ?Lab 05/06/22 ?1649 05/06/22 ?1721  ?WBC 10.3  --   ?NEUTROABS 8.4*  --   ?HGB 11.2* 11.9*  ?HCT 34.3* 35.0*  ?MCV 92.2  --   ?PLT 496*  --   ? ?Basic Metabolic Panel: ?Recent Labs  ?Lab 05/06/22 ?1649 05/06/22 ?1721 05/07/22 ?0415 05/08/22 ?0459  ?NA 134* 134* 135 133*  ?K 3.8 3.8 4.4 3.6  ?CL 100  --  100 94*  ?  CO2 26  --  25 29  ?GLUCOSE 130*  --  170* 97  ?BUN 12  --  12 13  ?CREATININE 0.70  --  0.78 0.77  ?CALCIUM 8.4*  --  8.2* 7.9*  ?MG 1.8  --   --  1.7  ? ?GFR: ?Estimated Creatinine Clearance: 42.5 mL/min (by C-G formula based on SCr of 0.77 mg/dL). ?Liver Function Tests: ?Recent Labs  ?Lab 05/06/22 ?1649  ?AST 28  ?ALT 21  ?ALKPHOS 286*  ?BILITOT 0.9  ?PROT 5.6*  ?ALBUMIN 2.7*  ? ?No results for input(s): LIPASE, AMYLASE in the last 168 hours. ?No results for input(s):  AMMONIA in the last 168 hours. ?Coagulation Profile: ?No results for input(s): INR, PROTIME in the last 168 hours. ?Cardiac Enzymes: ?No results for input(s): CKTOTAL, CKMB, CKMBINDEX, TROPONINI in the last 168 hours. ?BNP (last 3 results) ?No results for input(s): PROBNP in the last 8760 hours. ?HbA1C: ?No results for input(s): HGBA1C in the last 72 hours. ?CBG: ?No results for input(s): GLUCAP in the last 168 hours. ?Lipid Profile: ?No results for input(s): CHOL, HDL, LDLCALC, TRIG, CHOLHDL, LDLDIRECT in the last 72 hours. ?Thyroid Function Tests: ?No results for input(s): TSH, T4TOTAL, FREET4, T3FREE, THYROIDAB in the last 72 hours. ?Anemia Panel: ?No results for input(s): VITAMINB12, FOLATE, FERRITIN, TIBC, IRON, RETICCTPCT in the last 72 hours. ?Sepsis Labs: ?No results for input(s): PROCALCITON, LATICACIDVEN in the last 168 hours. ? ?Recent Results (from the past 240 hour(s))  ?Resp Panel by RT-PCR (Flu A&B, Covid) Nasopharyngeal Swab     Status: None  ? Collection Time: 05/06/22  4:37 PM  ? Specimen: Nasopharyngeal Swab; Nasopharyngeal(NP) swabs in vial transport medium  ?Result Value Ref Range Status  ? SARS Coronavirus 2 by RT PCR NEGATIVE NEGATIVE Final  ?  Comment: (NOTE) ?SARS-CoV-2 target nucleic acids are NOT DETECTED. ? ?The SARS-CoV-2 RNA is generally detectable in upper respiratory ?specimens during the acute phase of infection. The lowest ?concentration of SARS-CoV-2 viral copies this assay can detect is ?138 copies/mL. A negative result does not preclude SARS-Cov-2 ?infection and should not be used as the sole basis for treatment or ?other patient management decisions. A negative result may occur with  ?improper specimen collection/handling, submission of specimen other ?than nasopharyngeal swab, presence of viral mutation(s) within the ?areas targeted by this assay, and inadequate number of viral ?copies(<138 copies/mL). A negative result must be combined with ?clinical observations, patient  history, and epidemiological ?information. The expected result is Negative. ? ?Fact Sheet for Patients:  ?EntrepreneurPulse.com.au ? ?Fact Sheet for Healthcare Providers:  ?IncredibleEmployment.be ? ?This test is no t yet approved or cleared by the [place] FDA and  ?has been authorized for detection and/or diagnosis of SARS-CoV-2 by ?FDA under an Emergency Use Authorization (EUA). This EUA will remain  ?in effect (meaning this test can be used) for the duration of the ?COVID-19 declaration under Section 564(b)(1) of the Act, 21 ?U.S.C.section 360bbb-3(b)(1), unless the authorization is terminated  ?or revoked sooner.  ? ? ?  ? Influenza A by PCR NEGATIVE NEGATIVE Final  ? Influenza B by PCR NEGATIVE NEGATIVE Final  ?  Comment: (NOTE) ?The Xpert Xpress SARS-CoV-2/FLU/RSV plus assay is intended as an aid ?in the diagnosis of influenza from Nasopharyngeal swab specimens and ?should not be used as a sole basis for treatment. Nasal washings and ?aspirates are unacceptable for Xpert Xpress SARS-CoV-2/FLU/RSV ?testing. ? ?Fact Sheet for Patients: ?EntrepreneurPulse.com.au ? ?Fact Sheet for Healthcare Providers: ?IncredibleEmployment.be ? ?This test is  not yet approved or cleared by the [place] and ?has been authorized for detection and/or diagnosis of SARS-CoV-2 by ?FDA under an Emergency Use Authorization (EUA). This EUA will remain ?in effect (meaning this test can be used) for the duration of the ?COVID-19 declaration under Section 564(b)(1) of the Act, 21 U.S.C. ?section 360bbb-3(b)(1), unless the authorization is terminated or ?revoked. ? ?Performed at [facility], [place] [address], [place], [place] ?[address] ?  ?  ? ? ? ? ? ?Radiology Studies: ?CT Angio Chest PE W and/or Wo Contrast ? ?Result Date: 05/06/2022 ?CLINICAL DATA:  Short of breath, cough, hypoxia EXAM: CT ANGIOGRAPHY CHEST WITH CONTRAST TECHNIQUE: Multidetector CT  imaging of the chest was performed using the standard protocol during bolus administration of intravenous contrast. Multiplanar CT image reconstructions and MIPs were obtained to evaluate the vascular anatomy. RADI

## 2022-05-08 NOTE — Progress Notes (Addendum)
Heart Failure Stewardship Pharmacist Progress Note ? ? ?PCP: Tower, Wynelle Fanny, MD ?PCP-Cardiologist: None  ? ? 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   ? ?Recent admission in March for acute respiratory failure requiring intubation with incidental finding of new diastolic CHF (LVEF 25-63% on TTE, 50-55% on TEE) with mild LVH and moderate RV dysfunction.   ? ?She presented to ED from [facility] 05/14 complaining of progressive dyspnea with O2 sats in 80s on RA per EMS.  Patient reports that she only weighs herself weekly.  CTA negative for PE, evidence of stable bilateral pleural effusions and biatrial dilation.  CXR with vascular congestion.  Elevated BNP of 2404 along with JVD and 2+ edema on exam all consistent with CHF exacerbation.  Echo this admission with LVEF 55-60% slightly reduced from recent March hospitalization with new G2DD, moderately elevated pulmonary pressures and now normal RV function.  Started on IV diuresis with plans to get a cardiac MRI when euvolemic. ? ?Of note, patient is taking amlodipine 5 mg daily at home.  ? ?Current HF Medications: ?Diuretic: furosemide IV 40 mg daily ?Beta Blocker: metoprolol tartrate 25 mg twice daily ? ?Prior to admission HF Medications: ?Beta blocker: metoprolol tartrate 25 mg twice daily ? ?Pertinent Lab Values: ?Serum creatinine 0.77, BUN 13, Potassium 3.6, Sodium 133, BNP 2402, Magnesium 1.7, A1c 6% (02/2022) ? ?Vital Signs: ?Weight: 100.7 lbs, up- likely not accurate with 2L fluid off (admission weight: 100 lbs) ?Dry weight unclear - discharge weight 111lb in March ?Blood pressure: 130s/70s   ?Heart rate: 70-80s  ?I/O: -1.85L yesterday; net -2.2L ? ?Medication Assistance / Insurance Benefits Check: ?Does the patient have prescription insurance?  Yes ?Type of insurance plan: UHC Medicare - Delene Loll and [place] copays >$100 due to donut  hole ? ?Outpatient Pharmacy:  ?Prior to admission outpatient [facility] ?Is the patient willing to use [facility] at discharge? Pending ?Is the patient willing to transition their outpatient pharmacy to utilize a [facility] outpatient pharmacy?   Pending ?  ? ?Assessment: ?1. Acute on chronic diastolic CHF (LVEF 89-37%). NYHA class IV symptoms. ?- Follow plans for cardiac MRI when euvolemic  ?- Continue IV diuresis with furosemide 40 mg daily - dyspnea significantly improved. Agree with switching to PO tomorrow. ?- Continue beta-blocker ?  ?Plan: ?1) Medication changes recommended at this time: ?- SWITCH metoprolol tartrate 25 mg twice daily to succinate 50 mg daily starting tomorrow ?- STOP amlodipine 5 mg daily ?- START losartan 25 mg daily. Consider switching to Entresto 24-46 mg BID if BP tolerates.  ?- Eventually start SGLT2i and spironolactone as BP permits. Will check copays to ensure affordability. ? ?2) Patient assistance: ?- pending patient discussion ? ?3)  Education  ?- To be completed prior to discharge ?- Reinforce importance of daily weights and fluid restriction ? ?Laurey Arrow, PharmD ?PGY1 Pharmacy Resident ?05/08/2022  12:26 PM ? ?

## 2022-05-09 ENCOUNTER — Inpatient Hospital Stay (HOSPITAL_COMMUNITY): Payer: Medicare Other

## 2022-05-09 DIAGNOSIS — I361 Nonrheumatic tricuspid (valve) insufficiency: Secondary | ICD-10-CM | POA: Diagnosis not present

## 2022-05-09 DIAGNOSIS — J90 Pleural effusion, not elsewhere classified: Secondary | ICD-10-CM

## 2022-05-09 DIAGNOSIS — I5033 Acute on chronic diastolic (congestive) heart failure: Secondary | ICD-10-CM | POA: Diagnosis not present

## 2022-05-09 DIAGNOSIS — E8581 Light chain (AL) amyloidosis: Secondary | ICD-10-CM | POA: Diagnosis not present

## 2022-05-09 DIAGNOSIS — J9601 Acute respiratory failure with hypoxia: Secondary | ICD-10-CM | POA: Diagnosis not present

## 2022-05-09 LAB — LIPID PANEL
Cholesterol: 118 mg/dL (ref 0–200)
HDL: 33 mg/dL — ABNORMAL LOW (ref >40)
LDL Cholesterol: 65 mg/dL (ref 0–99)
Total CHOL/HDL Ratio: 3.6 RATIO
Triglycerides: 101 mg/dL (ref <150)
VLDL: 20 mg/dL (ref 0–40)

## 2022-05-09 LAB — MAGNESIUM: Magnesium: 2.3 mg/dL (ref 1.7–2.4)

## 2022-05-09 LAB — BASIC METABOLIC PANEL
Anion gap: 9 (ref 5–15)
BUN: 14 mg/dL (ref 8–23)
CO2: 28 mmol/L (ref 22–32)
Calcium: 8 mg/dL — ABNORMAL LOW (ref 8.9–10.3)
Chloride: 95 mmol/L — ABNORMAL LOW (ref 98–111)
Creatinine, Ser: 0.74 mg/dL (ref 0.44–1.00)
GFR, Estimated: >60 mL/min (ref >60)
Glucose, Bld: 92 mg/dL (ref 70–99)
Potassium: 4.2 mmol/L (ref 3.5–5.1)
Sodium: 132 mmol/L — ABNORMAL LOW (ref 135–145)

## 2022-05-09 IMAGING — MR MR CARD MORPHOLOGY WO/W CM
45 of 48 series · 45 of 48 positions shown · IV contrast (Contrast agent)
Comparison: none

CLINICAL DATA: Amyloid evaluation

EXAM:
CARDIAC MRI
TECHNIQUE: The patient was scanned on a 1.5 Tesla Siemens magnet. A dedicated
cardiac coil was used. Functional imaging was done using Fiesta
sequences. [DATE], and 4 chamber views were done to assess for RWMA's.
Modified NORFIZAH rule using a short axis stack was used to
calculate an ejection fraction on a dedicated work station using
Circle software. The patient received cc of Gadavist. After 10
minutes inversion recovery sequences were used to assess for
infiltration and scar tissue.
CONTRAST:  cc  of Gadavist

[Series 5: [id number]_bh · axial · 8.0mm · 1.41mm/px · 1 of 16 slices shown]
[im 1/16]
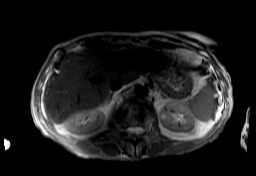

[Series 9: bSSFP · oblique · 8.0mm · 1.61mm/px · 1 of 25 slices shown (1 of 17)]
[im 1/25]
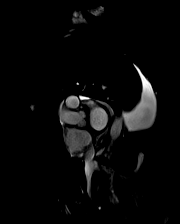

[Series 10: bSSFP · oblique · 8.0mm · 1.61mm/px · 1 of 25 slices shown (2 of 17)]
[im 1/25]
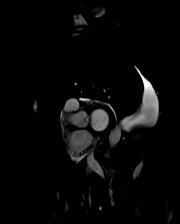

[Series 11: bSSFP · oblique · 8.0mm · 1.61mm/px · 1 of 25 slices shown (3 of 17)]
[im 1/25]
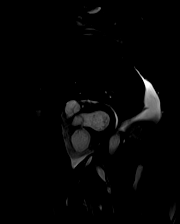

[Series 12: bSSFP · oblique · 8.0mm · 1.61mm/px · 1 of 25 slices shown (4 of 17)]
[im 1/25]
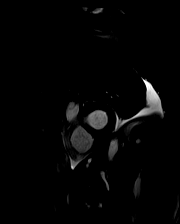

[Series 13: bSSFP · oblique · 8.0mm · 1.61mm/px · 1 of 25 slices shown (5 of 17)]
[im 1/25]
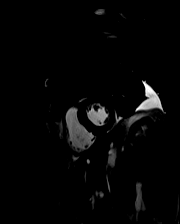

[Series 14: bSSFP · oblique · 8.0mm · 1.61mm/px · 1 of 25 slices shown (6 of 17)]
[im 1/25]
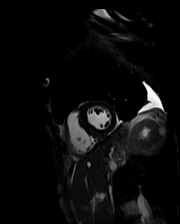

[Series 15: bSSFP · oblique · 8.0mm · 1.61mm/px · 1 of 25 slices shown (7 of 17)]
[im 1/25]
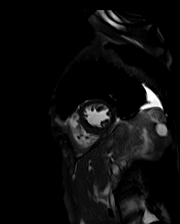

[Series 16: bSSFP · oblique · 8.0mm · 1.61mm/px · 1 of 25 slices shown (8 of 17)]
[im 1/25]
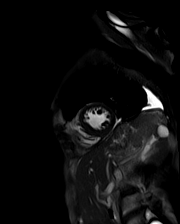

[Series 17: bSSFP · oblique · 8.0mm · 1.61mm/px · 1 of 25 slices shown (9 of 17)]
[im 1/25]
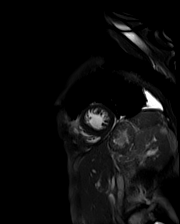

[Series 18: bSSFP · oblique · 8.0mm · 1.61mm/px · 1 of 25 slices shown (10 of 17)]
[im 1/25]
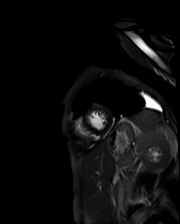

[Series 19: bSSFP · oblique · 8.0mm · 1.61mm/px · 1 of 25 slices shown (11 of 17)]
[im 1/25]
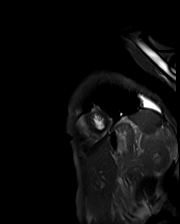

[Series 20: bSSFP · oblique · 8.0mm · 1.61mm/px · 1 of 25 slices shown (12 of 17)]
[im 1/25]
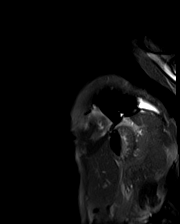

[Series 21: bSSFP · oblique · 8.0mm · 1.61mm/px · 1 of 25 slices shown (13 of 17)]
[im 1/25]
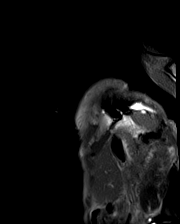

[Series 22: bSSFP · oblique · 8.0mm · 1.61mm/px · 1 of 25 slices shown (14 of 17)]
[im 1/25]
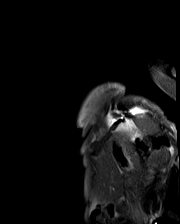

[Series 23: bSSFP · oblique · 6.0mm · 1.41mm/px · 1 of 25 slices shown (15 of 17)]
[im 1/25]
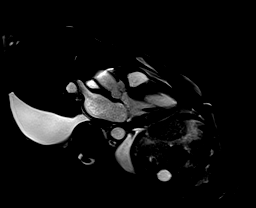

[Series 24: bSSFP · oblique · 6.0mm · 1.41mm/px · 1 of 25 slices shown (16 of 17)]
[im 1/25]
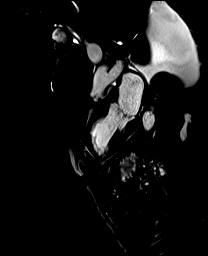

[Series 25: bSSFP · oblique · 6.0mm · 1.41mm/px · 1 of 25 slices shown (17 of 17)]
[im 1/25]
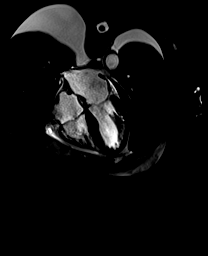

[Series 26: cine_trufi_cs_rt_short axis · oblique · 8.0mm · 1.73mm/px · 1 of 15 slices shown (1 of 14)]
[im 1/15]
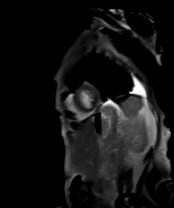

[Series 26: cine_trufi_cs_rt_short axis · oblique · 8.0mm · 1.73mm/px · 1 of 15 slices shown (2 of 14)]
[im 1/15]
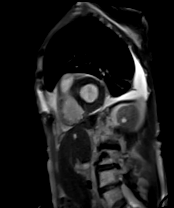

[Series 26: cine_trufi_cs_rt_short axis · oblique · 8.0mm · 1.73mm/px · 1 of 15 slices shown (3 of 14)]
[im 1/15]
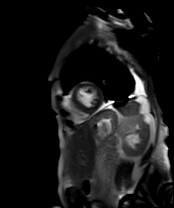

[Series 26: cine_trufi_cs_rt_short axis · oblique · 8.0mm · 1.73mm/px · 1 of 15 slices shown (4 of 14)]
[im 1/15]
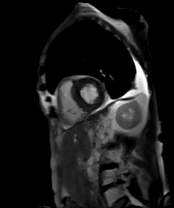

[Series 26: cine_trufi_cs_rt_short axis · oblique · 8.0mm · 1.73mm/px · 1 of 15 slices shown (5 of 14)]
[im 1/15]
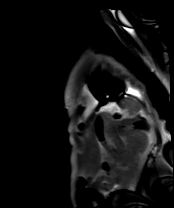

[Series 26: cine_trufi_cs_rt_short axis · oblique · 8.0mm · 1.73mm/px · 1 of 15 slices shown (6 of 14)]
[im 1/15]
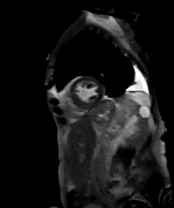

[Series 26: cine_trufi_cs_rt_short axis · oblique · 8.0mm · 1.73mm/px · 1 of 15 slices shown (7 of 14)]
[im 1/15]
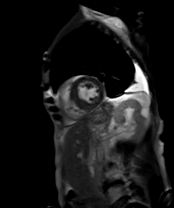

[Series 26: cine_trufi_cs_rt_short axis · oblique · 8.0mm · 1.73mm/px · 1 of 15 slices shown (8 of 14)]
[im 1/15]
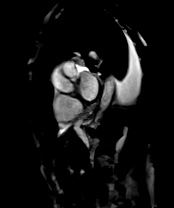

[Series 26: cine_trufi_cs_rt_short axis · oblique · 8.0mm · 1.73mm/px · 1 of 15 slices shown (9 of 14)]
[im 1/15]
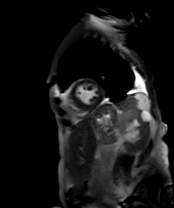

[Series 26: cine_trufi_cs_rt_short axis · oblique · 8.0mm · 1.73mm/px · 1 of 15 slices shown (10 of 14)]
[im 1/15]
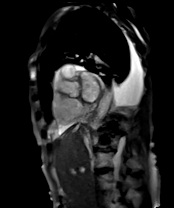

[Series 26: cine_trufi_cs_rt_short axis · oblique · 8.0mm · 1.73mm/px · 1 of 15 slices shown (11 of 14)]
[im 1/15]
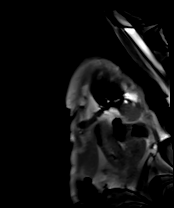

[Series 26: cine_trufi_cs_rt_short axis · oblique · 8.0mm · 1.73mm/px · 1 of 15 slices shown (12 of 14)]
[im 1/15]
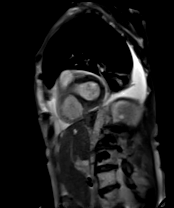

[Series 26: cine_trufi_cs_rt_short axis · oblique · 8.0mm · 1.73mm/px · 1 of 15 slices shown (13 of 14)]
[im 1/15]
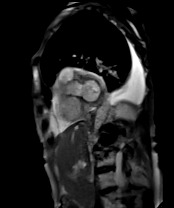

[Series 26: cine_trufi_cs_rt_short axis · oblique · 8.0mm · 1.73mm/px · 1 of 15 slices shown (14 of 14)]
[im 1/15]
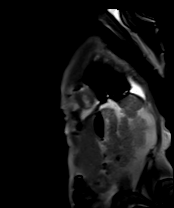

[Series 27: cine_trufi_short axis_cs_2_shot · oblique · 8.0mm · 1.48mm/px · 1 of 25 slices shown (1 of 13)]
[im 1/25]
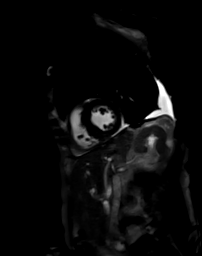

[Series 27: cine_trufi_short axis_cs_2_shot · oblique · 8.0mm · 1.48mm/px · 1 of 25 slices shown (2 of 13)]
[im 1/25]
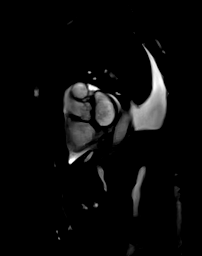

[Series 27: cine_trufi_short axis_cs_2_shot · oblique · 8.0mm · 1.48mm/px · 1 of 25 slices shown (3 of 13)]
[im 1/25]
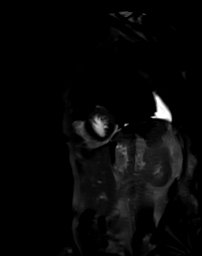

[Series 27: cine_trufi_short axis_cs_2_shot · oblique · 8.0mm · 1.48mm/px · 1 of 25 slices shown (4 of 13)]
[im 1/25]
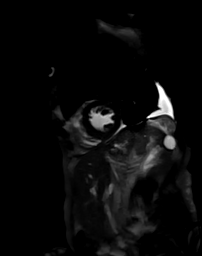

[Series 27: cine_trufi_short axis_cs_2_shot · oblique · 8.0mm · 1.48mm/px · 1 of 25 slices shown (5 of 13)]
[im 1/25]
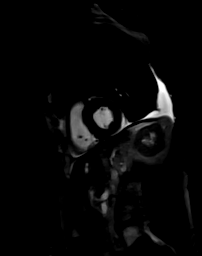

[Series 27: cine_trufi_short axis_cs_2_shot · oblique · 8.0mm · 1.48mm/px · 1 of 25 slices shown (6 of 13)]
[im 1/25]
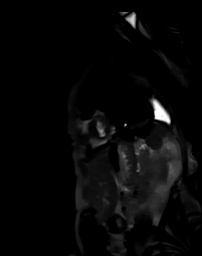

[Series 27: cine_trufi_short axis_cs_2_shot · oblique · 8.0mm · 1.48mm/px · 1 of 25 slices shown (7 of 13)]
[im 1/25]
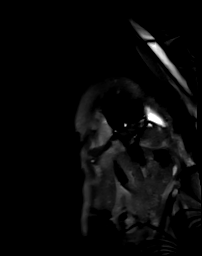

[Series 27: cine_trufi_short axis_cs_2_shot · oblique · 8.0mm · 1.48mm/px · 1 of 25 slices shown (8 of 13)]
[im 1/25]
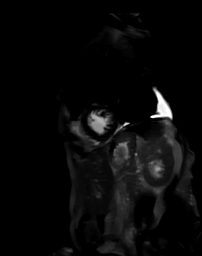

[Series 27: cine_trufi_short axis_cs_2_shot · oblique · 8.0mm · 1.48mm/px · 1 of 25 slices shown (9 of 13)]
[im 1/25]
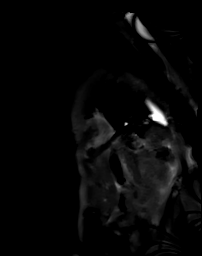

[Series 27: cine_trufi_short axis_cs_2_shot · oblique · 8.0mm · 1.48mm/px · 1 of 25 slices shown (10 of 13)]
[im 1/25]
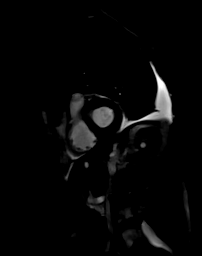

[Series 27: cine_trufi_short axis_cs_2_shot · oblique · 8.0mm · 1.48mm/px · 1 of 25 slices shown (11 of 13)]
[im 1/25]
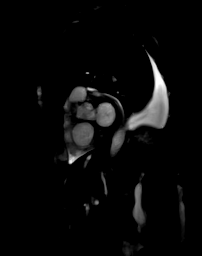

[Series 27: cine_trufi_short axis_cs_2_shot · oblique · 8.0mm · 1.48mm/px · 1 of 25 slices shown (12 of 13)]
[im 1/25]
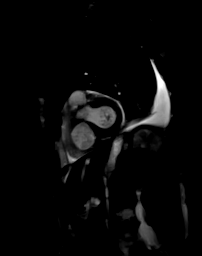

[Series 27: cine_trufi_short axis_cs_2_shot · oblique · 8.0mm · 1.48mm/px · 1 of 25 slices shown (13 of 13)]
[im 1/25]
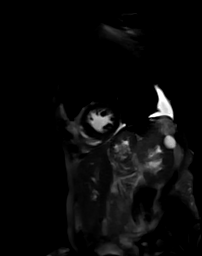

[45 of 48 positions shown; findings below may reference images not displayed]

FINDINGS: Left ventricle:

-Normal size

-Normal systolic function

-Asymmetric hypertrophy measuring up to 13mm in basal septum (7mm in
posterior wall)

-ECV significantly elevated (38%)

-No LGE

LV EF: 56% (Normal 56-78%)

Absolute volumes:

LV EDV: 78mL (Normal 77-195 mL)

LV ESV: 34mL (Normal 19-72 mL)

LV SV: 44mL (Normal 51-133 mL)

CO: 3.0L/min (Normal 2.8-8.8 L/min)

Indexed volumes:

LV EDV: 57mL/sq-m (Normal 47-92 mL/sq-m)

LV ESV: 25mL/sq-m (Normal 13-30 mL/sq-m)

LV SV: 32mL/sq-m (Normal 32-62 mL/sq-m)

CI: 2.2L/min/sq-m (Normal 1.7-4.2 L/min/sq-m)

Right ventricle: Normal size and systolic function

RV EF:  55% (Normal 47-74%)

Absolute volumes:

RV EDV: 77mL (Normal 88-227 mL)

RV ESV: 34mL (Normal 23-103 mL)

RV SV: 42mL (Normal 52-138 mL)

CO: 2.9L/min (Normal 2.8-8.8 L/min)

Indexed volumes:

RV EDV: 56mL/sq-m (Normal 55-105 mL/sq-m)

RV ESV: 25mL/sq-m (Normal 15-43 mL/sq-m)

RV SV: 31mL/sq-m (Normal 32-64 mL/sq-m)

CI: 2.1L/min/sq-m (Normal 1.7-4.2 L/min/sq-m)

Left atrium: Mild enlargement

Right atrium: Normal size

Mitral valve: Trivial regurgitation

Aortic valve: No regurgitation

Tricuspid valve: Mild regurgitation

Pulmonic valve: No regurgitation

Aorta: Normal proximal ascending aorta

Pericardium: Small effusion

Extracardiac structures: Moderate right, small left pleural effusion
IMPRESSION: 1. ECV significantly elevated (38%) with moderate asymmetric LV
hypertrophy. While ECV is not quite in amyloid range (>40%) and
there is no LGE, ECV elevation precedes development of LGE in
cardiac amyloid. Suspect findings consistent with early cardiac
amyloid

2.  Normal LV size and systolic function (56%)

3. Normal RV size and systolic function (55%)

## 2022-05-09 MED ORDER — GADOBUTROL 1 MMOL/ML IV SOLN
5.0000 mL | Freq: Once | INTRAVENOUS | Status: AC | PRN
  Administered 2022-05-09: 5 mL via INTRAVENOUS

## 2022-05-09 MED ORDER — FUROSEMIDE 40 MG PO TABS
40.0000 mg | ORAL_TABLET | Freq: Every day | ORAL | Status: DC
  Administered 2022-05-11: 40 mg via ORAL
  Filled 2022-05-09 (×2): qty 1

## 2022-05-09 NOTE — Progress Notes (Signed)
Physical Therapy Treatment ?Patient Details ?Name: Wendy [name] ?MRN: [id number] ?DOB: 04-28-1946 ?Today's Date: 05/09/2022 ? ? ?History of Present Illness Pt is a 76 y/o female admitted 05/06/22 secondary to CHF exacerbation. PMH includes HTN, multiple myeloma, CVA, DVT, and hep A. ? ?  ?PT Comments  ? ? Pt is making good, steady progress with mobility as she was able to ambulate x2 bouts of ~16 ft distances in the room with a RW and min guard assist today. She continues to fatigue quickly and did display decreased R step length and a L drift, likely from her prior CVA. Also, focused on progressing pt's safety and independence with transfers with noted success as she progressed from needing minA initially to only needing min guard assist by the end of the session. Will continue to follow acutely. Current recommendations remain appropriate. ?   ?Recommendations for follow up therapy are one component of a multi-disciplinary discharge planning process, led by the attending physician.  Recommendations may be updated based on patient status, additional functional criteria and insurance authorization. ? ?Follow Up Recommendations ? Home health PT (resume PT at ALF) ?  ?  ?Assistance Recommended at Discharge Frequent or constant Supervision/Assistance  ?Patient can return home with the following A little help with walking and/or transfers;A little help with bathing/dressing/bathroom;Assistance with cooking/housework;Help with stairs or ramp for entrance;Assist for transportation;Direct supervision/assist for financial management;Direct supervision/assist for medications management ?  ?Equipment Recommendations ? None recommended by PT  ?  ?Recommendations for Other Services   ? ? ?  ?Precautions / Restrictions Precautions ?Precautions: Fall ?Restrictions ?Weight Bearing Restrictions: No  ?  ? ?Mobility ? Bed Mobility ?Overal bed mobility: Needs Assistance ?Bed Mobility: Supine to Sit, Sit to Supine ?  ?  ?Supine to sit: Min  assist, HOB elevated ?Sit to supine: Min guard, HOB elevated ?  ?General bed mobility comments: Pt initiating legs off EOB well but needed minA to pull trunk up to sit, extra time to scoot to EOB. Min guard for safety with return to supine. ?  ? ?Transfers ?Overall transfer level: Needs assistance ?Equipment used: Rolling walker (2 wheels) ?Transfers: Sit to/from Stand ?Sit to Stand: Min assist, Min guard ?  ?  ?  ?  ?  ?General transfer comment: Cues to push up with one UE on bed for safety, x6 sit <> stand reps progressing from minA to shift weight anteriorly and steady pt first few reps to min guard assist final few reps as pt displayed improved carryover of technique. ?  ? ?Ambulation/Gait ?Ambulation/Gait assistance: Min guard ?Gait Distance (Feet): 16 Feet (x2 bouts of ~16 ft each) ?Assistive device: Rolling walker (2 wheels) ?Gait Pattern/deviations: Step-through pattern, Decreased step length - right, Decreased stride length, Drifts right/left (drifts to L side of RW) ?Gait velocity: reduced ?Gait velocity interpretation: <1.31 ft/sec, indicative of household ambulator ?  ?General Gait Details: Pt with slow gait with kyphotic posture and decreased R step length with tendency to drift to L side of RW (reports hx of CVA affecting R side). Needs cues to find midline momentarily. No LOB, min guard for safety and line management. ? ? ?Stairs ?  ?  ?  ?  ?  ? ? ?Wheelchair Mobility ?  ? ?Modified Rankin (Stroke Patients Only) ?  ? ? ?  ?Balance Overall balance assessment: Needs assistance ?Sitting-balance support: No upper extremity supported, Feet supported ?Sitting balance-Leahy Scale: Fair ?  ?  ?Standing balance support: Bilateral upper extremity supported ?Standing balance-Leahy  Scale: Poor ?Standing balance comment: Reliant on BUE support and external support ?  ?  ?  ?  ?  ?  ?  ?  ?  ?  ?  ?  ? ?  ?Cognition Arousal/Alertness: Awake/alert ?Behavior During Therapy: [facility] for tasks assessed/performed ?Overall  Cognitive Status: Within Functional Limits for tasks assessed ?  ?  ?  ?  ?  ?  ?  ?  ?  ?  ?  ?  ?  ?  ?  ?  ?General Comments: Follows commands appropriately. Needs increased time to process or sequence, but likely baseline. ?  ?  ? ?  ?Exercises Other Exercises ?Other Exercises: sit <> stand x6 reps from EOB ? ?  ?General Comments General comments (skin integrity, edema, etc.): VSS on RA (SpO2 with poor waveforms reading in 80s% at times but 90s% with good waveforms) ?  ?  ? ?Pertinent Vitals/Pain Pain Assessment ?Pain Assessment: Faces ?Faces Pain Scale: Hurts a little bit ?Pain Location: generalized with mobility ?Pain Descriptors / Indicators: Discomfort ?Pain Intervention(s): Limited activity within patient's tolerance, Monitored during session, Repositioned  ? ? ?Home Living   ?  ?  ?  ?  ?  ?  ?  ?  ?  ?   ?  ?Prior Function    ?  ?  ?   ? ?PT Goals (current goals can now be found in the care plan section) Acute Rehab PT Goals ?Patient Stated Goal: to get stronger ?PT Goal Formulation: With patient ?Time For Goal Achievement: 05/21/22 ?Potential to Achieve Goals: Good ?Progress towards PT goals: Progressing toward goals ? ?  ?Frequency ? ? ? Min 3X/week ? ? ? ?  ?PT Plan Current plan remains appropriate  ? ? ?Co-evaluation   ?  ?  ?  ?  ? ?  ?AM-PAC PT "6 Clicks" Mobility   ?Outcome Measure ? Help needed turning from your back to your side while in a flat bed without using bedrails?: A Little ?Help needed moving from lying on your back to sitting on the side of a flat bed without using bedrails?: A Little ?Help needed moving to and from a bed to a chair (including a wheelchair)?: A Little ?Help needed standing up from a chair using your arms (e.g., wheelchair or bedside chair)?: A Little ?Help needed to walk in hospital room?: A Little ?Help needed climbing 3-5 steps with a railing? : A Lot ?6 Click Score: 17 ? ?  ?End of Session Equipment Utilized During Treatment: Gait belt ?Activity Tolerance: Patient  tolerated treatment well ?Patient left: with call bell/phone within reach;in bed;with bed alarm set ?  ?PT Visit Diagnosis: Unsteadiness on feet (R26.81);Muscle weakness (generalized) (M62.81);Difficulty in walking, not elsewhere classified (R26.2);Other abnormalities of gait and mobility (R26.89) ?  ? ? ?Time: 9741-6384 ?PT Time Calculation (min) (ACUTE ONLY): 19 min ? ?Charges:  $Therapeutic Activity: 8-22 mins          ?          ? ?Moishe Spice, PT, DPT ?Acute Rehabilitation Services  ?Pager: [phone] ?Office: [phone] ? ? ? ?Maretta Bees Pettis ?05/09/2022, 4:08 PM ? ?

## 2022-05-09 NOTE — Progress Notes (Signed)
?PROGRESS NOTE ? ? ? ?Wendy [name]  FIE:[id number] DOB: 1946/01/23 DOA: 05/06/2022 ?PCP: Abner Greenspan, MD  ? ?Brief Narrative:  ?76 year old with history of HTN, multiple myeloma, AL amyloidosis seeing outpatient oncology undergoing immunotherapy comes to the hospital with progressive dyspnea on exertion found to be in CHF.  Patient was admitted recently to the hospital for similar reason complicated by multifocal CVA.  Patient was started on IV diuretics with plans to get cardiac MRI on 5/17.   ? ? ?Assessment & Plan: ? Principal Problem: ?  Acute on chronic heart failure with preserved ejection fraction (HFpEF) ([place]) ?Active Problems: ?  Acute respiratory failure ([place]) ?  Light chain (AL) amyloidosis (HCC) ?  Essential hypertension ?  Multiple myeloma ([place]) ?  Acute heart failure ([place]) ?  ? ? ?Assessment and Plan: ?Acute on chronic heart failure with preserved ejection fraction (HFpEF) ([place]) ?Echocardiogram March 2023-EF 50%, PAH ?Pt with known light chain amyloidosis disease. ?-Cardiology following, continue IV diuresis,cardiac MRI ?- Home meds-aspirin/Plavix, metoprolol twice daily ?be diastolic dysfunction. ? ?Acute respiratory failure ([place]) ?Moderate bilateral pleural effusion ?Stable bilateral pulmonary nodule ?-Secondary to CHF exacerbation ?- CTA chest negative for PE ? ?Light chain (AL) amyloidosis (HCC) ?Jearld Shines with outpatient oncology team.  Cardiac MRI pending ? ?Essential hypertension ?Cont home BP meds-Norvasc, metoprolol.  Additionally getting IV Lasix. ? ?Multiple myeloma ([place]) ?-Pathologic fracture of manubrium.  Follows outpatient oncology ? ?History of CVA ?- CVA in March.  Currently on aspirin Plavix and statin ? ?History of seizure ?- On Keppra 500 mg twice daily ? ?Severe protein calorie malnutrition ?- Dietitian consult ? ?DVT prophylaxis: Lovenox ?Code Status: Full code ? ? ? ? ?Subjective: ?Feeling better, does not have any claustrophobia  ? ?Examination: ? ? ?General: Appearance:     Thin female in no acute distress  ?   ?Lungs:      respirations unlabored  ?Heart:    Normal heart rate.  ?MS:   All extremities are intact.   ?Neurologic:   Awake, alert   ?  ? ? ? ?Objective: ?Vitals:  ? 05/08/22 2100 05/09/22 0421 05/09/22 0802 05/09/22 0841  ?BP:  118/73 125/72   ?Pulse: 80 68 76   ?Resp:  '20 18 18  ' ?Temp:  98.9 ?F (37.2 ?C) 98.4 ?F (36.9 ?C)   ?TempSrc:  Oral Oral   ?SpO2: 94% 91% 92%   ?Weight:  43.5 kg    ?Height:      ? ? ?Intake/Output Summary (Last 24 hours) at 05/09/2022 0852 ?Last data filed at 05/09/2022 0423 ?Gross per 24 hour  ?Intake 603.3 ml  ?Output 1150 ml  ?Net -546.7 ml  ? ?Filed Weights  ? 05/07/22 1555 05/08/22 0652 05/09/22 0421  ?Weight: 45.6 kg 45.7 kg 43.5 kg  ? ? ? ?Data Reviewed:  ? ?CBC: ?Recent Labs  ?Lab 05/06/22 ?1649 05/06/22 ?1721  ?WBC 10.3  --   ?NEUTROABS 8.4*  --   ?HGB 11.2* 11.9*  ?HCT 34.3* 35.0*  ?MCV 92.2  --   ?PLT 496*  --   ? ?Basic Metabolic Panel: ?Recent Labs  ?Lab 05/06/22 ?1649 05/06/22 ?1721 05/07/22 ?0415 05/08/22 ?0459 05/09/22 ?0124  ?NA 134* 134* 135 133* 132*  ?K 3.8 3.8 4.4 3.6 4.2  ?CL 100  --  100 94* 95*  ?CO2 26  --  '25 29 28  ' ?GLUCOSE 130*  --  170* 97 92  ?BUN 12  --  '12 13 14  ' ?CREATININE 0.70  --  0.78 0.77 0.74  ?CALCIUM 8.4*  --  8.2* 7.9* 8.0*  ?MG 1.8  --   --  1.7 2.3  ? ?GFR: ?Estimated Creatinine Clearance: 40.4 mL/min (by C-G formula based on SCr of 0.74 mg/dL). ?Liver Function Tests: ?Recent Labs  ?Lab 05/06/22 ?1649  ?AST 28  ?ALT 21  ?ALKPHOS 286*  ?BILITOT 0.9  ?PROT 5.6*  ?ALBUMIN 2.7*  ? ?No results for input(s): LIPASE, AMYLASE in the last 168 hours. ?No results for input(s): AMMONIA in the last 168 hours. ?Coagulation Profile: ?No results for input(s): INR, PROTIME in the last 168 hours. ?Cardiac Enzymes: ?No results for input(s): CKTOTAL, CKMB, CKMBINDEX, TROPONINI in the last 168 hours. ?BNP (last 3 results) ?No results for input(s): PROBNP in the last 8760 hours. ?HbA1C: ?No results for input(s): HGBA1C in the last  72 hours. ?CBG: ?No results for input(s): GLUCAP in the last 168 hours. ?Lipid Profile: ?Recent Labs  ?  05/09/22 ?0124  ?CHOL 118  ?HDL 33*  ?[place] 65  ?TRIG 101  ?CHOLHDL 3.6  ? ?Thyroid Function Tests: ?No results for input(s): TSH, T4TOTAL, FREET4, T3FREE, THYROIDAB in the last 72 hours. ?Anemia Panel: ?No results for input(s): VITAMINB12, FOLATE, FERRITIN, TIBC, IRON, RETICCTPCT in the last 72 hours. ?Sepsis Labs: ?No results for input(s): PROCALCITON, LATICACIDVEN in the last 168 hours. ? ?Recent Results (from the past 240 hour(s))  ?Resp Panel by RT-PCR (Flu A&B, Covid) Nasopharyngeal Swab     Status: None  ? Collection Time: 05/06/22  4:37 PM  ? Specimen: Nasopharyngeal Swab; Nasopharyngeal(NP) swabs in vial transport medium  ?Result Value Ref Range Status  ? SARS Coronavirus 2 by RT PCR NEGATIVE NEGATIVE Final  ?  Comment: (NOTE) ?SARS-CoV-2 target nucleic acids are NOT DETECTED. ? ?The SARS-CoV-2 RNA is generally detectable in upper respiratory ?specimens during the acute phase of infection. The lowest ?concentration of SARS-CoV-2 viral copies this assay can detect is ?138 copies/mL. A negative result does not preclude SARS-Cov-2 ?infection and should not be used as the sole basis for treatment or ?other patient management decisions. A negative result may occur with  ?improper specimen collection/handling, submission of specimen other ?than nasopharyngeal swab, presence of viral mutation(s) within the ?areas targeted by this assay, and inadequate number of viral ?copies(<138 copies/mL). A negative result must be combined with ?clinical observations, patient history, and epidemiological ?information. The expected result is Negative. ? ?Fact Sheet for Patients:  ?EntrepreneurPulse.com.au ? ?Fact Sheet for Healthcare Providers:  ?IncredibleEmployment.be ? ?This test is no t yet approved or cleared by the [place] FDA and  ?has been authorized for detection and/or  diagnosis of SARS-CoV-2 by ?FDA under an Emergency Use Authorization (EUA). This EUA will remain  ?in effect (meaning this test can be used) for the duration of the ?COVID-19 declaration under Section 564(b)(1) of the Act, 21 ?U.S.C.section 360bbb-3(b)(1), unless the authorization is terminated  ?or revoked sooner.  ? ? ?  ? Influenza A by PCR NEGATIVE NEGATIVE Final  ? Influenza B by PCR NEGATIVE NEGATIVE Final  ?  Comment: (NOTE) ?The Xpert Xpress SARS-CoV-2/FLU/RSV plus assay is intended as an aid ?in the diagnosis of influenza from Nasopharyngeal swab specimens and ?should not be used as a sole basis for treatment. Nasal washings and ?aspirates are unacceptable for Xpert Xpress SARS-CoV-2/FLU/RSV ?testing. ? ?Fact Sheet for Patients: ?EntrepreneurPulse.com.au ? ?Fact Sheet for Healthcare Providers: ?IncredibleEmployment.be ? ?This test is not yet approved or cleared by the [place] and ?has been authorized for detection and/or  diagnosis of SARS-CoV-2 by ?FDA under an Emergency Use Authorization (EUA). This EUA will remain ?in effect (meaning this test can be used) for the duration of the ?COVID-19 declaration under Section 564(b)(1) of the Act, 21 U.S.C. ?section 360bbb-3(b)(1), unless the authorization is terminated or ?revoked. ? ?Performed at [facility], [place] [address], [place], [place] ?[address] ?  ?  ? ? ? ? ? ?Radiology Studies: ?ECHOCARDIOGRAM LIMITED ? ?Result Date: 05/07/2022 ?   ECHOCARDIOGRAM LIMITED REPORT   Patient Name:   Shahad L. Isenhower Date of Exam: 05/07/2022 Medical Rec #:  [id number]     Height:       62.0 in Accession #:    [id number]    Weight:       111.1 lb Date of Birth:  1946-08-30     BSA:          1.489 m? Patient Age:    49 years      BP:           117/67 mmHg Patient Gender: F             HR:           84 bpm. Exam Location:  Inpatient Procedure: 2D Echo, Limited Echo, Cardiac Doppler and Color Doppler Indications:    CHF  History:         Patient has prior history of Echocardiogram examinations. CAD;                 Risk Factors:Hypertension.  Sonographer:    Jyl Heinz Referring Phys: [id number] [place]  1. Left ve

## 2022-05-09 NOTE — Progress Notes (Signed)
? ?Progress Note ? ?Patient Name: Wendy [name]. [place] ?Date of Encounter: 05/09/2022 ? ?[facility] Cardiologist: None  ? ?Subjective  ? ?Net -0.5 L yesterday, -1.9 L on admission BP 119/59.  Creatinine stable at 0.7.  She reports dyspnea has significantly improved ? ?Inpatient Medications  ?  ?Scheduled Meds: ? aspirin  81 mg Oral Daily  ? atorvastatin  40 mg Oral QPM  ? clopidogrel  75 mg Oral Daily  ? docusate sodium  100 mg Oral BID  ? enoxaparin (LOVENOX) injection  40 mg Subcutaneous Q24H  ? feeding supplement  1 Container Oral TID BM  ? furosemide  40 mg Intravenous Daily  ? ipratropium-albuterol  3 mL Inhalation TID  ? levETIRAcetam  500 mg Oral BID  ? losartan  25 mg Oral Daily  ? melatonin  10 mg Oral QHS  ? metoprolol succinate  50 mg Oral Daily  ? mirtazapine  15 mg Oral QHS  ? pantoprazole  40 mg Oral Daily  ? polyvinyl alcohol  1 drop Both Eyes Daily  ? sertraline  50 mg Oral Daily  ? sodium chloride flush  3 mL Intravenous Q12H  ? ?Continuous Infusions: ? sodium chloride    ? ?PRN Meds: ?sodium chloride, acetaminophen, ALPRAZolam, calcium carbonate, guaiFENesin, hydrALAZINE, ipratropium-albuterol, metoprolol tartrate, ondansetron (ZOFRAN) IV, polyethylene glycol, sodium chloride flush, traMADol, traZODone, zolpidem  ? ?Vital Signs  ?  ?Vitals:  ? 05/09/22 0421 05/09/22 0802 05/09/22 1017 05/09/22 0939  ?BP: 118/73 125/72  122/66  ?Pulse: 68 76  85  ?Resp: '20 18 18   '$ ?Temp: 98.9 ?F (37.2 ?C) 98.4 ?F (36.9 ?C)    ?TempSrc: Oral Oral    ?SpO2: 91% 92%    ?Weight: 43.5 kg     ?Height:      ? ? ?Intake/Output Summary (Last 24 hours) at 05/09/2022 1021 ?Last data filed at 05/09/2022 0857 ?Gross per 24 hour  ?Intake 663.3 ml  ?Output 1150 ml  ?Net -486.7 ml  ? ? ? ?  05/09/2022  ?  4:21 AM 05/08/2022  ?  6:52 AM 05/07/2022  ?  3:55 PM  ?Last 3 Weights  ?Weight (lbs) 95 lb 14.4 oz 100 lb 12 oz 100 lb 8.5 oz  ?Weight (kg) 43.5 kg 45.7 kg 45.6 kg  ?   ? ?Telemetry  ?  ?Normal sinus rhythm in 80s- Personally  Reviewed ? ?ECG  ?  ?No new EKG- Personally Reviewed ? ?Physical Exam  ? ?GEN: No acute distress.   ?Neck: No JVD ?Cardiac: RRR, no murmurs, rubs, or gallops.  ?Respiratory: CTAB ?GI: Soft, nontender, non-distended  ?MS: No edema; ?Neuro:  Nonfocal  ?Psych: Normal affect  ? ?Labs  ?  ?High Sensitivity Troponin:   ?Recent Labs  ?Lab 05/06/22 ?1649 05/06/22 ?1838  ?TROPONINIHS 30* 31*  ? ?   ?Chemistry ?Recent Labs  ?Lab 05/06/22 ?1649 05/06/22 ?1721 05/07/22 ?0415 05/08/22 ?0459 05/09/22 ?0124  ?NA 134*   < > 135 133* 132*  ?K 3.8   < > 4.4 3.6 4.2  ?CL 100  --  100 94* 95*  ?CO2 26  --  '25 29 28  '$ ?GLUCOSE 130*  --  170* 97 92  ?BUN 12  --  '12 13 14  '$ ?CREATININE 0.70  --  0.78 0.77 0.74  ?CALCIUM 8.4*  --  8.2* 7.9* 8.0*  ?MG 1.8  --   --  1.7 2.3  ?PROT 5.6*  --   --   --   --   ?  ALBUMIN 2.7*  --   --   --   --   ?AST 28  --   --   --   --   ?ALT 21  --   --   --   --   ?ALKPHOS 286*  --   --   --   --   ?BILITOT 0.9  --   --   --   --   ?GFRNONAA >60  --  >60 >60 >60  ?ANIONGAP 8  --  '10 10 9  '$ ? < > = values in this interval not displayed.  ? ?  ?Lipids  ?Recent Labs  ?Lab 05/09/22 ?0124  ?CHOL 118  ?TRIG 101  ?HDL 33*  ?[place] 65  ?CHOLHDL 3.6  ?  ?Hematology ?Recent Labs  ?Lab 05/06/22 ?1649 05/06/22 ?1721  ?WBC 10.3  --   ?RBC 3.72*  --   ?HGB 11.2* 11.9*  ?HCT 34.3* 35.0*  ?MCV 92.2  --   ?MCH 30.1  --   ?MCHC 32.7  --   ?RDW 15.5  --   ?PLT 496*  --   ? ? ?Thyroid No results for input(s): TSH, FREET4 in the last 168 hours.  ?BNP ?Recent Labs  ?Lab 05/06/22 ?1635  ?BNP 2,404.2*  ? ?  ?DDimer No results for input(s): DDIMER in the last 168 hours.  ? ?Radiology  ?  ?ECHOCARDIOGRAM LIMITED ? ?Result Date: 05/07/2022 ?   ECHOCARDIOGRAM LIMITED REPORT   Patient Name:   Wendy [name] Date of Exam: 05/07/2022 Medical Rec #:  [id number]     Height:       62.0 in Accession #:    [id number]    Weight:       111.1 lb Date of Birth:  03-20-1946     BSA:          1.489 m? Patient Age:    76 years      BP:           117/67 mmHg  Patient Gender: F             HR:           84 bpm. Exam Location:  Inpatient Procedure: 2D Echo, Limited Echo, Cardiac Doppler and Color Doppler Indications:    CHF  History:        Patient has prior history of Echocardiogram examinations. CAD;                 Risk Factors:Hypertension.  Sonographer:    Jyl Heinz Referring Phys: [id number] [place]  1. Left ventricular ejection fraction, by estimation, is 55 to 60%. The left ventricle has normal function. The left ventricle has no regional wall motion abnormalities. There is mild concentric left ventricular hypertrophy. Left ventricular diastolic parameters are consistent with Grade II diastolic dysfunction (pseudonormalization).  2. Right ventricular systolic function is normal. The right ventricular size is normal. There is moderately elevated pulmonary artery systolic pressure.  3. Left atrial size was mildly dilated.  4. A small pericardial effusion is present. The pericardial effusion is circumferential.  5. The mitral valve is normal in structure. Mild mitral valve regurgitation. No evidence of mitral stenosis.  6. The aortic valve is normal in structure. Aortic valve regurgitation is trivial. Aortic valve sclerosis/calcification is present, without any evidence of aortic stenosis.  7. The inferior vena cava is normal in size with greater than 50% respiratory variability, suggesting right atrial pressure of 3 mmHg. FINDINGS  Left Ventricle: Left ventricular ejection fraction, by estimation, is 55 to 60%. The left ventricle has normal function. The left ventricle has no regional wall motion abnormalities. The left ventricular internal cavity size was normal in size. There is  mild concentric left ventricular hypertrophy. Left ventricular diastolic parameters are consistent with Grade II diastolic dysfunction (pseudonormalization). Right Ventricle: The right ventricular size is normal. No increase in right ventricular wall thickness.  Right ventricular systolic function is normal. There is moderately elevated pulmonary artery systolic pressure. The tricuspid regurgitant velocity is 3.45 m/s, and with an assumed right atrial pressure of 3 mmHg, the estimated right ventricular systolic pressure is 28.3 mmHg. Left Atrium: Left atrial size was mildly dilated. Right Atrium: Right atrial size was normal in size. Pericardium: A small pericardial effusion is present. The pericardial effusion is circumferential. Mitral Valve: The mitral valve is normal in structure. Mild mitral valve regurgitation. No evidence of mitral valve stenosis. Tricuspid Valve: The tricuspid valve is normal in structure. Tricuspid valve regurgitation is not demonstrated. No evidence of tricuspid stenosis. Aortic Valve: The aortic valve is normal in structure. Aortic valve regurgitation is trivial. Aortic valve sclerosis/calcification is present, without any evidence of aortic stenosis. Aortic valve peak gradient measures 9.0 mmHg. Pulmonic Valve: The pulmonic valve was normal in structure. Pulmonic valve regurgitation is trivial. No evidence of pulmonic stenosis. Aorta: The aortic root is normal in size and structure. Venous: The inferior vena cava is normal in size with greater than 50% respiratory variability, suggesting right atrial pressure of 3 mmHg. IAS/Shunts: No atrial level shunt detected by color flow Doppler. LEFT VENTRICLE PLAX 2D LVIDd:         3.40 cm     Diastology LVIDs:         2.40 cm     LV e' medial:    4.03 cm/s LV PW:         0.90 cm     LV E/e' medial:  19.7 LV IVS:        1.10 cm     LV e' lateral:   5.77 cm/s LVOT diam:     1.80 cm     LV E/e' lateral: 13.8 LV SV:         47 LV SV Index:   32 LVOT Area:     2.54 cm?  LV Volumes (MOD) LV vol d, MOD A2C: 74.7 ml LV vol d, MOD A4C: 59.9 ml LV vol s, MOD A2C: 27.5 ml LV vol s, MOD A4C: 22.0 ml LV SV MOD A2C:     47.2 ml LV SV MOD A4C:     59.9 ml LV SV MOD BP:      43.9 ml RIGHT VENTRICLE            IVC RV S  prime:     9.79 cm/s  IVC diam: 1.40 cm TAPSE (M-mode): 1.5 cm LEFT ATRIUM         Index LA diam:    3.70 cm 2.48 cm/m?  AORTIC VALVE AV Area (Vmax): 1.63 cm? AV Vmax:        150.00 cm/s AV Peak Grad:   9.0 mmHg

## 2022-05-09 NOTE — Progress Notes (Signed)
Heart Failure Stewardship Pharmacist Progress Note ? ? ?PCP: Tower, Wynelle Fanny, MD ?PCP-Cardiologist: None  ? ? 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   ? ?Recent admission in March for acute respiratory failure requiring intubation with incidental finding of new diastolic CHF (LVEF 35-36% on TTE, 50-55% on TEE) with mild LVH and moderate RV dysfunction.   ? ?She presented to ED from [facility]14 complaining of progressive dyspnea with O2 sats in 80s on RA per EMS.  Patient reports that she only weighs herself weekly.  CTA negative for PE, evidence of stable bilateral pleural effusions and biatrial dilation.  CXR with vascular congestion.  Elevated BNP of 2404 along with JVD and 2+ edema on exam all consistent with CHF exacerbation.  Echo this admission with LVEF 55-60% slightly reduced from recent March hospitalization with new G2DD, moderately elevated pulmonary pressures and now normal RV function.  Started on IV diuresis with plans to get a cardiac MRI when euvolemic. ? ?Of note, patient is taking amlodipine 5 mg daily at home.  ? ?Current HF Medications: ?Diuretic: furosemide IV 40 mg daily ?Beta Blocker: metoprolol succinate 50 mg daily ? ?Prior to admission HF Medications: ?Beta blocker: metoprolol tartrate 25 mg twice daily ? ?Pertinent Lab Values: ?Serum creatinine 0.74, BUN 14, Potassium 4.2, Sodium 132, BNP 2402, Magnesium 2.3, A1c 6% (02/2022) ? ?Vital Signs: ?Weight: 95.9 lbs, down 4lb (admission weight: 100 lbs) ?Dry weight unclear - discharge weight 111lb in March ?Blood pressure: 110-20s/60s ?Heart rate: 70s  ?I/O: -1L yesterday; net -3.1L ? ?Medication Assistance / Insurance Benefits Check: ?Does the patient have prescription insurance?  Yes ?Type of insurance plan: UHC Medicare - Delene Loll and [place] copays >$100 due to donut hole ? ?Outpatient Pharmacy:  ?Prior to admission  out[facility] ?Is the patient willing to use [place] at discharge? Pending ?Is the patient willing to transition their outpatient pharmacy to utilize a [facility] outpatient pharmacy?   Pending ?  ? ?Assessment: ?1. Acute on chronic diastolic CHF (LVEF 14-43%). NYHA class IV symptoms. ?- Follow plans for cardiac MRI when euvolemic  ?- Continue IV diuresis with furosemide 40 mg daily - dyspnea significantly improved. Agree with switching to PO tomorrow. ?- Continue metoprolol succinate 50 mg daily ?- Continue losartan 25 mg daily ?- No MRA/SGLTi with labile BP with diuresis ?  ?Plan: ?1) Medication changes recommended at this time: ?- Stop losartan tomorrow and switch to Entresto 24-26 mg twice daily  ?- Eventually start SGLT2i and spironolactone as BP permits.  ? ?2) Patient assistance: ?- pending patient discussion ? ?3)  Education  ?- To be completed prior to discharge ?- Reinforce importance of daily weights and fluid restriction ? ?Laurey Arrow, PharmD ?PGY1 Pharmacy Resident ?05/09/2022  8:17 AM ? ?

## 2022-05-09 NOTE — TOC Initial Note (Signed)
Transition of Care (TOC) - Initial/Assessment Note  ? ? ?Patient Details  ?Name: Wendy [name]. Hirschman ?MRN: [id number] ?Date of Birth: 1946/08/11 ? ?Transition of Care (TOC) CM/SW Contact:    ?Reece Agar, LCSWA ?Phone Number: ?05/09/2022, 2:59 PM ? ?Clinical Narrative:                 ?Pt is from [facility] and will return with [facility] services when medically stable. CSW spoke with Rollene Fare in admissions to provide a update on DC status. ? ?Expected Discharge Plan: Assisted Living ?Barriers to Discharge: Continued Medical Work up ? ? ?Patient Goals and CMS Choice ?Patient states their goals for this hospitalization and ongoing recovery are:: Return to ALF ?CMS Medicare.gov Compare Post Acute Care list provided to:: Patient ?Choice offered to / list presented to : Patient ? ?Expected Discharge Plan and Services ?Expected Discharge Plan: Assisted Living ?In-house Referral: Clinical Social Work ?  ?Post Acute Care Choice: NA ?Living arrangements for the past 2 months: [place] ?                ?  ?  ?  ?  ?  ?  ?  ?  ?  ?  ? ?Prior Living Arrangements/Services ?Living arrangements for the past 2 months: [place] ?Lives with:: Facility Resident ?Patient language and need for interpreter reviewed:: Yes ?Do you feel safe going back to the place where you live?: Yes      ?Need for Family Participation in Patient Care: Yes (Comment) ?Care giver support system in place?: Yes (comment) ?  ?Criminal Activity/Legal Involvement Pertinent to Current Situation/Hospitalization: No - Comment as needed ? ?Activities of Daily Living ?Home Assistive Devices/Equipment: Wheelchair, Environmental consultant (specify type) ?ADL Screening (condition at time of admission) ?Patient's cognitive ability adequate to safely complete daily activities?: Yes ?Is the patient deaf or have difficulty hearing?: No ?Does the patient have difficulty seeing, even when wearing glasses/contacts?: No ?Does the patient have difficulty concentrating,  remembering, or making decisions?: No ?Patient able to express need for assistance with ADLs?: Yes ?Does the patient have difficulty dressing or bathing?: No ?Independently performs ADLs?: Yes (appropriate for developmental age) ?Does the patient have difficulty walking or climbing stairs?: Yes ?Weakness of Legs: Both ?Weakness of Arms/Hands: Both ? ?Permission Sought/Granted ?Permission sought to share information with : Family Supports, Customer service manager ?Permission granted to share information with : Yes, Verbal Permission Granted ? Share Information with NAME: Darnell Level (POA) (Daughter)   [phone] ? Permission granted to share info w AGENCY: [facility] ? Permission granted to share info w Relationship: Darnell Level ([place]) (Daughter)   [phone] ? Permission granted to share info w Contact Information: Darnell Level ([place]) (Daughter)   [phone] ? ?Emotional Assessment ?Appearance:: Appears stated age ?  ?Affect (typically observed): Accepting ?Orientation: : Oriented to Self, Oriented to Place, Oriented to  Time, Oriented to Situation ?Alcohol / Substance Use: Not Applicable ?Psych Involvement: No (comment) ? ?Admission diagnosis:  Acute respiratory failure with hypoxia ([place]) [J96.01] ?Acute on chronic congestive heart failure, unspecified heart failure type ([place]) [I50.9] ?Acute on chronic heart failure with preserved ejection fraction (HFpEF) ([place]) [I50.33] ?Patient Active Problem List  ? Diagnosis Date Noted  ? Acute on chronic heart failure with preserved ejection fraction (HFpEF) ([place]) 05/06/2022  ? Acute CVA (cerebrovascular accident) ([place]) 03/16/2022  ? Seizure ([place]) 03/16/2022  ? Protein-calorie malnutrition, severe 03/12/2022  ? Acute heart failure ([place])   ? Altered mental status   ? Acute respiratory failure (  [place]) 03/11/2022  ? Pain in buttock 02/09/2022  ? Low back pain 02/09/2022  ? Poor balance 02/09/2022  ? History of fall 02/09/2022  ? Generalized weakness 02/09/2022  ? Stress  reaction 12/24/2021  ? Current use of proton pump inhibitor 10/27/2021  ? Full code status 05/31/2021  ? Flash pulmonary edema ([place]) 04/30/2021  ? Heme positive stool 04/30/2021  ? Hyperkalemia 04/30/2021  ? Compression fracture of T10 vertebra ([place]) 03/14/2021  ? History of DVT (deep vein thrombosis) 03/13/2021  ? Multiple myeloma ([place]) 02/10/2021  ? Leucocytosis 02/10/2021  ? 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 Situational anxiety 01/11/2021  ? 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  ? Hyperlipidemia 10/19/2020  ? Hearing loss 06/14/2020  ? Pedal edema 06/01/2020  ? Aortic atherosclerosis ([place]) 04/06/2020  ? CAD (coronary artery disease) 04/06/2020  ? H/O compression fracture of spine 04/06/2020  ? Chronic back pain 04/06/2020  ? Abnormal CT scan of lung 03/17/2020  ? Heartburn 06/02/2018  ? Chronic constipation 12/31/2017  ? Blood glucose elevated 09/25/2017  ? History of ileus 06/07/2017  ? Right carpal tunnel syndrome 12/07/2016  ? Estrogen deficiency 09/24/2016  ? Routine general medical examination at a health care facility 09/04/2015  ? Colon cancer screening 12/11/2014  ? Encounter for Medicare annual wellness exam 05/17/2013  ? Osteoarthritis 03/28/2011  ? Degenerative disc disease, lumbar 03/28/2011  ? Hyponatremia 02/12/2011  ? Essential hypertension 08/01/2010  ? Osteoporosis 08/01/2010  ? ?PCP:  Abner Greenspan, MD ?Pharmacy:  No Pharmacies Listed ? ? ? ?Social Determinants of Health (SDOH) Interventions ?Food Insecurity Interventions: Intervention Not Indicated ?Financial Strain Interventions: Intervention Not Indicated ?Housing Interventions: Intervention Not Indicated ?Transportation Interventions: Intervention Not Indicated ? ?Readmission Risk Interventions ?   ? View : No data to display.  ?  ?  ?  ? ? ? ?

## 2022-05-10 ENCOUNTER — Inpatient Hospital Stay (HOSPITAL_COMMUNITY): Payer: Medicare Other

## 2022-05-10 DIAGNOSIS — J90 Pleural effusion, not elsewhere classified: Secondary | ICD-10-CM

## 2022-05-10 DIAGNOSIS — I5033 Acute on chronic diastolic (congestive) heart failure: Secondary | ICD-10-CM | POA: Diagnosis not present

## 2022-05-10 DIAGNOSIS — E8581 Light chain (AL) amyloidosis: Secondary | ICD-10-CM | POA: Diagnosis not present

## 2022-05-10 DIAGNOSIS — J9601 Acute respiratory failure with hypoxia: Secondary | ICD-10-CM | POA: Diagnosis not present

## 2022-05-10 HISTORY — PX: IR THORACENTESIS ASP PLEURAL SPACE W/IMG GUIDE: IMG5380

## 2022-05-10 LAB — LACTATE DEHYDROGENASE, PLEURAL OR PERITONEAL FLUID: LD, Fluid: 64 U/L — ABNORMAL HIGH (ref 3–23)

## 2022-05-10 LAB — BASIC METABOLIC PANEL
Anion gap: 7 (ref 5–15)
BUN: 11 mg/dL (ref 8–23)
CO2: 33 mmol/L — ABNORMAL HIGH (ref 22–32)
Calcium: 7.9 mg/dL — ABNORMAL LOW (ref 8.9–10.3)
Chloride: 94 mmol/L — ABNORMAL LOW (ref 98–111)
Creatinine, Ser: 0.83 mg/dL (ref 0.44–1.00)
GFR, Estimated: >60 mL/min (ref >60)
Glucose, Bld: 93 mg/dL (ref 70–99)
Potassium: 3.6 mmol/L (ref 3.5–5.1)
Sodium: 134 mmol/L — ABNORMAL LOW (ref 135–145)

## 2022-05-10 LAB — GRAM STAIN: Gram Stain: NONE SEEN

## 2022-05-10 LAB — BODY FLUID CELL COUNT WITH DIFFERENTIAL
Eos, Fluid: 0 %
Lymphs, Fluid: 26 %
Monocyte-Macrophage-Serous Fluid: 55 % (ref 50–90)
Neutrophil Count, Fluid: 19 % (ref 0–25)
Total Nucleated Cell Count, Fluid: 223 cu mm (ref 0–1000)

## 2022-05-10 LAB — PROTEIN, PLEURAL OR PERITONEAL FLUID: Total protein, fluid: <3 g/dL

## 2022-05-10 LAB — MAGNESIUM: Magnesium: 2 mg/dL (ref 1.7–2.4)

## 2022-05-10 IMAGING — DX DG CHEST 1V
1 series · 1 of 1 positions shown · non-contrast
Comparison: [DATE]

CLINICAL DATA: Status post right thoracentesis

EXAM:
CHEST  1 VIEW

[chest ap]
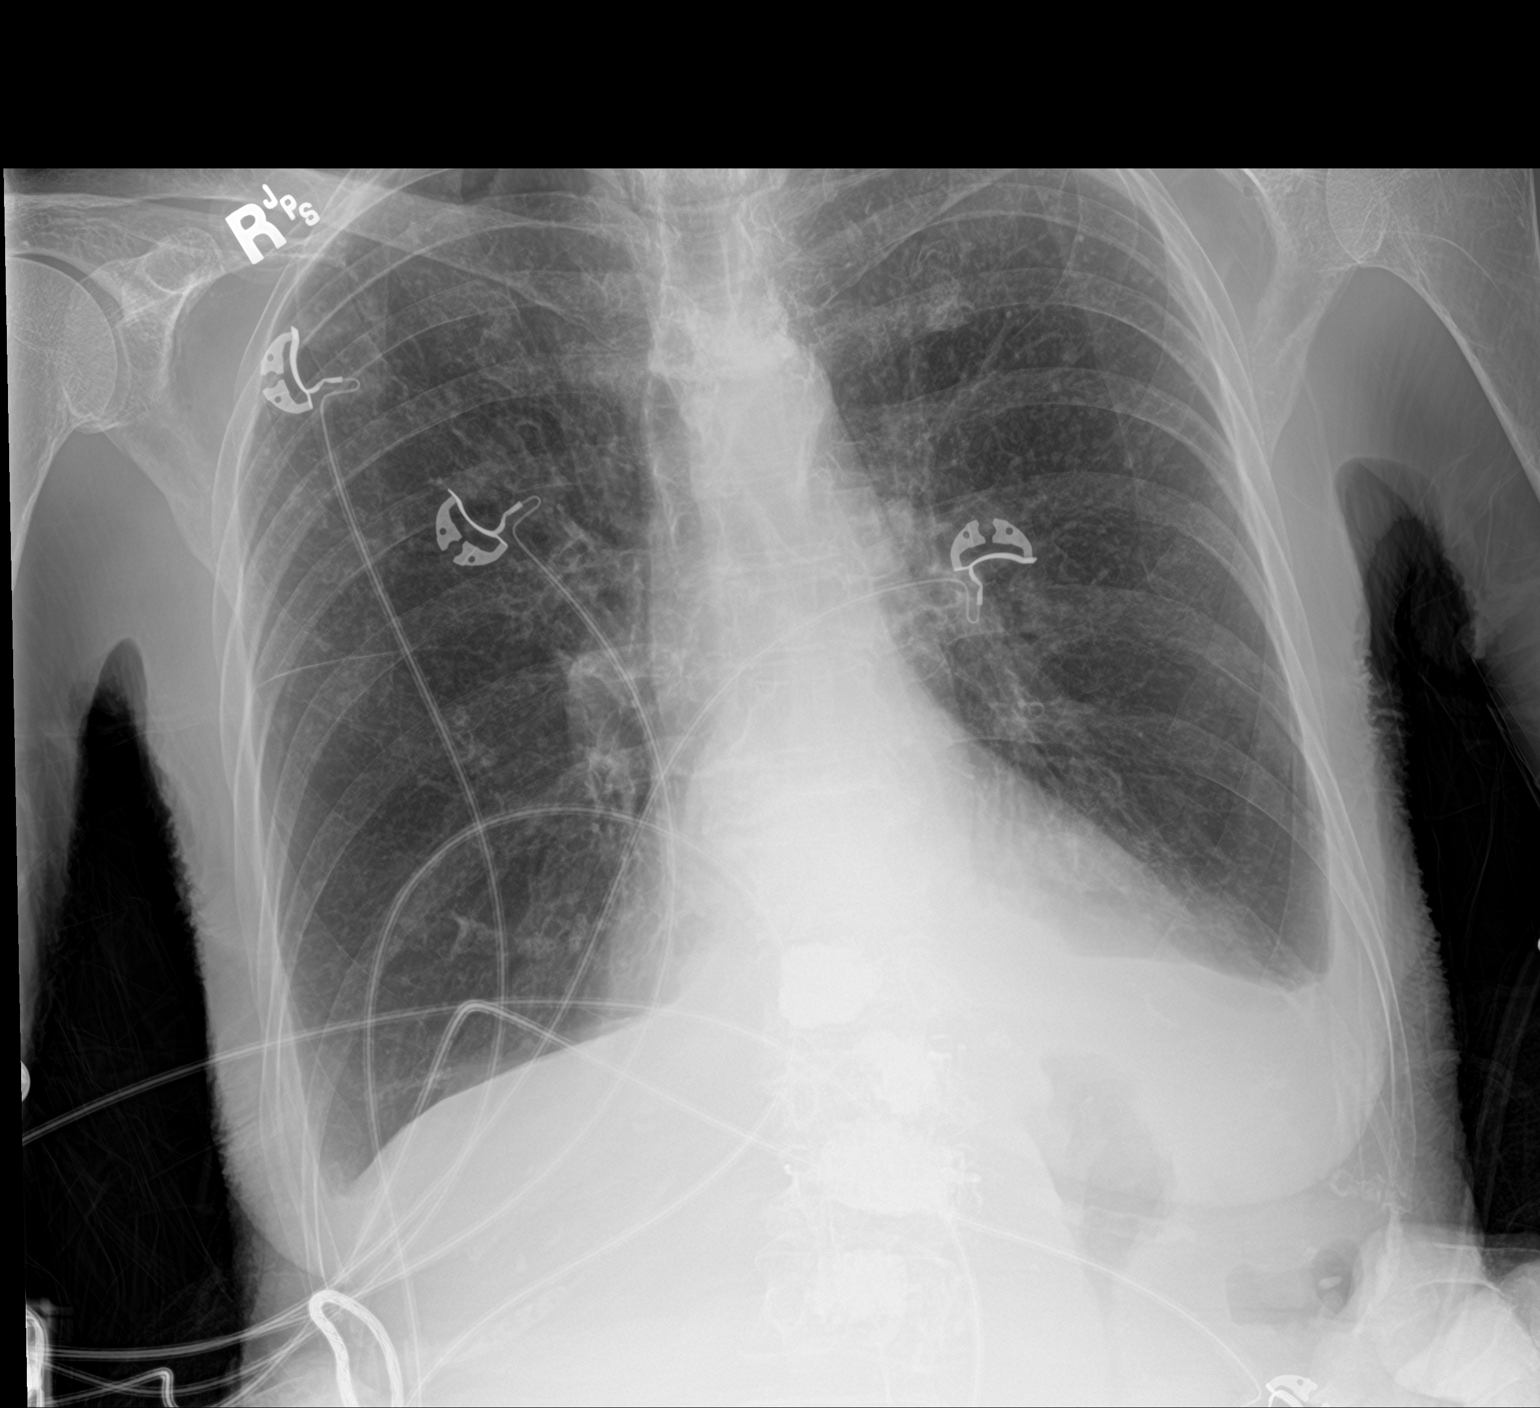

[1 of 1 positions shown; findings below may reference images not displayed]

FINDINGS: Transverse diameter of heart is increased. Central pulmonary vessels
are less prominent. There are no signs of alveolar pulmonary edema.
There is interval decrease in right pleural effusion. Small
bilateral pleural effusions are seen, more so on the left side.
There is no pneumothorax. There is evidence of previous
vertebroplasty in multiple lower thoracic and upper lumbar vertebral
bodies.
IMPRESSION: There is interval decrease in pulmonary vascular congestion. There
is interval decrease in right pleural effusion. There is no
pneumothorax.

## 2022-05-10 IMAGING — US IR THORACENTESIS ASP PLEURAL SPACE W/IMG GUIDE
1 series · 3 of 3 positions shown · non-contrast
Comparison: none

INDICATION: Patient with history of HTN, multiple myeloma, AL amyloidosis with
complaint of progressive dyspnea found to be in CHF. Request for
diagnostic and therapeutic right thoracentesis.

[Series 1: ir (person_name)/(person_name) · 3 of 3 slices shown]
[im 1/3]
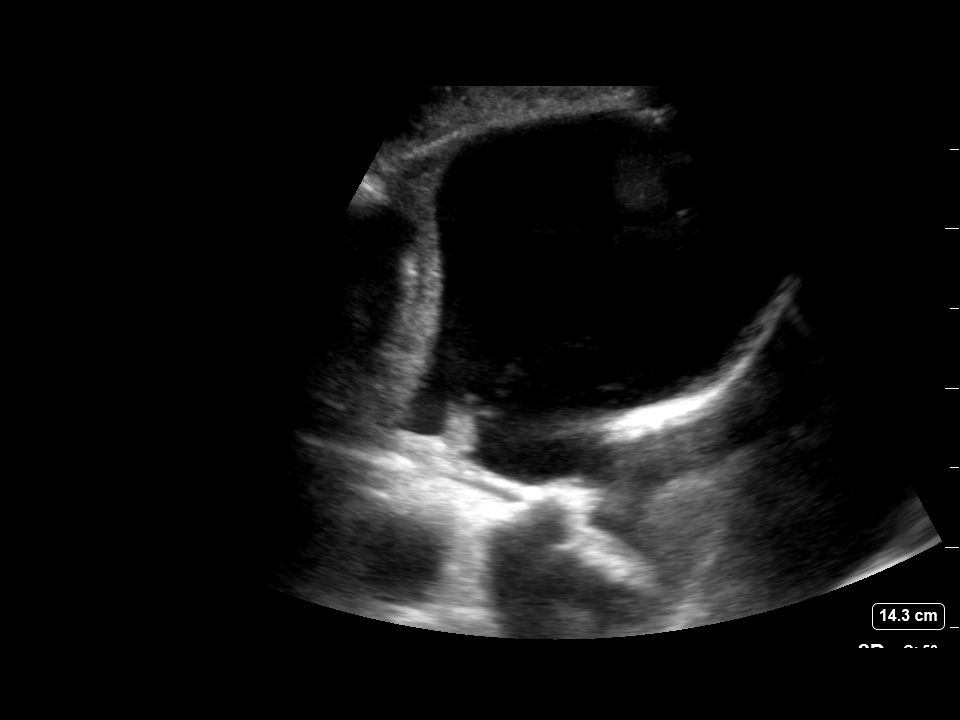
[im 2/3]
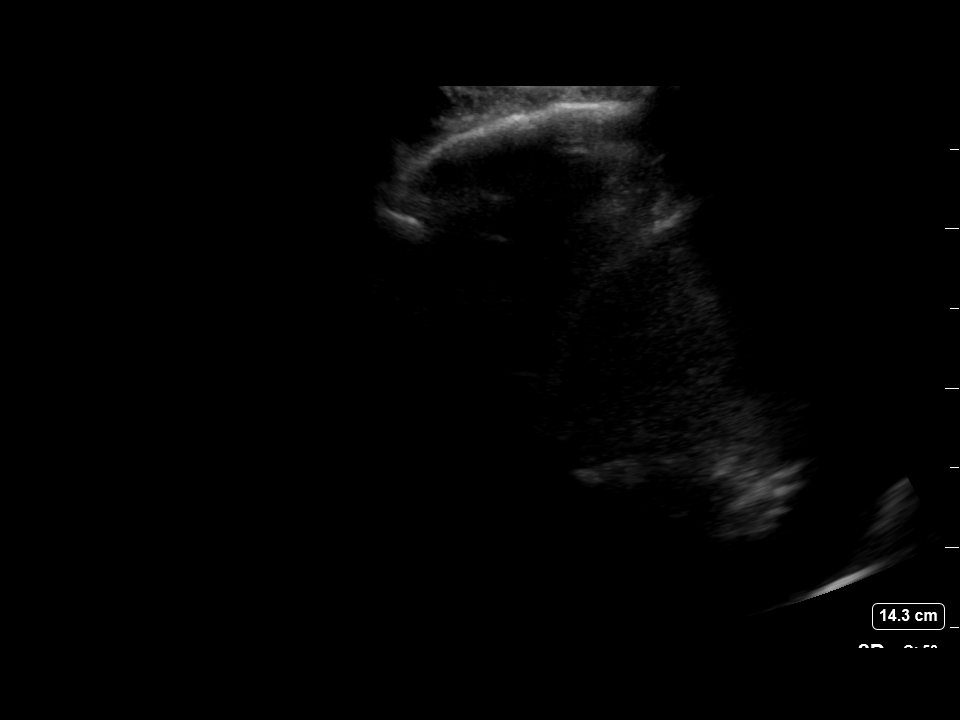
[im 3/3]
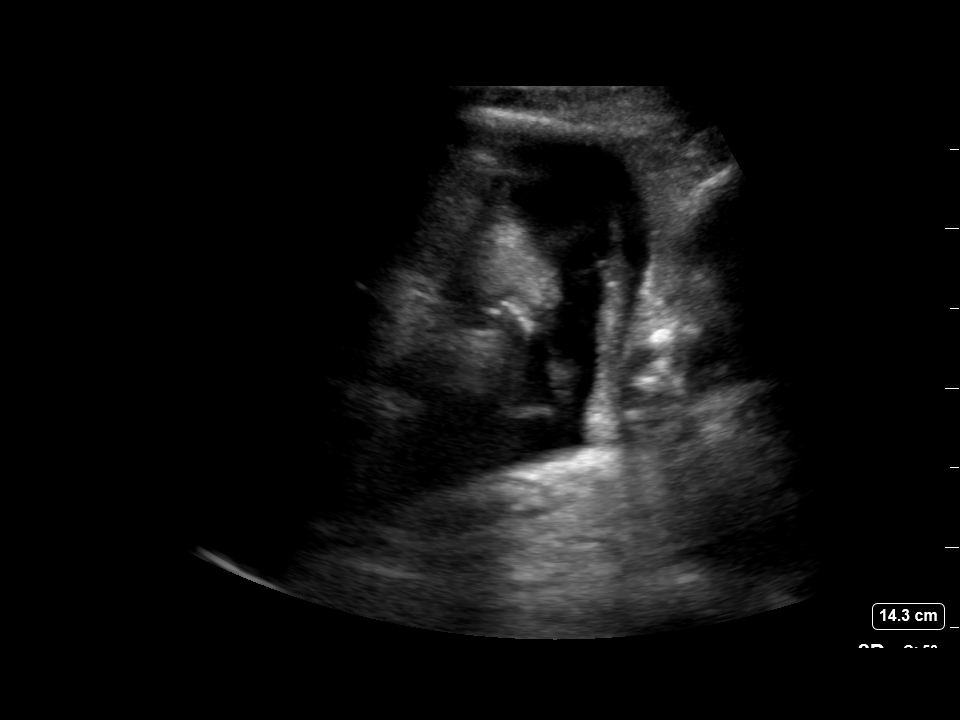

[3 of 3 positions shown; findings below may reference images not displayed]

EXAM:
ULTRASOUND GUIDED DIAGNOSTIC AND THERAPEUTIC RIGHT THORACENTESIS

MEDICATIONS:
10 mL 1 % lidocaine

COMPLICATIONS:
None immediate.

PROCEDURE:
An ultrasound guided thoracentesis was thoroughly discussed with the
patient and questions answered. The benefits, risks, alternatives
and complications were also discussed. The patient understands and
wishes to proceed with the procedure. Written consent was obtained.

Ultrasound was performed to localize and mark an adequate pocket of
fluid in the right chest. The area was then prepped and draped in
the normal sterile fashion. 1% Lidocaine was used for local
anesthesia. Under ultrasound guidance a 6 Fr Safe-T-Centesis
catheter was introduced. Thoracentesis was performed. The catheter
was removed and a dressing applied.
FINDINGS: A total of approximately 400 mL of clear, yellow fluid was removed.
Samples were sent to the laboratory as requested by the clinical
team.
IMPRESSION: Successful ultrasound guided right thoracentesis yielding 400 mL of
pleural fluid.

## 2022-05-10 MED ORDER — POTASSIUM CHLORIDE CRYS ER 20 MEQ PO TBCR
40.0000 meq | EXTENDED_RELEASE_TABLET | Freq: Once | ORAL | Status: AC
  Administered 2022-05-10: 40 meq via ORAL
  Filled 2022-05-10: qty 2

## 2022-05-10 MED ORDER — LIDOCAINE HCL 1 % IJ SOLN
INTRAMUSCULAR | Status: AC
  Administered 2022-05-10: 10 mL
  Filled 2022-05-10: qty 20

## 2022-05-10 NOTE — Procedures (Signed)
PROCEDURE SUMMARY:  Successful US guided diagnostic and therapeutic right thoracentesis. Yielded 400 ml of clear, yellow fluid. Pt tolerated procedure well. No immediate complications.  Specimen was sent for labs. CXR ordered.  EBL < 1 mL  Tyson Alias, AGNP 05/10/2022 12:45 PM

## 2022-05-10 NOTE — Progress Notes (Signed)
Progress Note  Patient Name: Wendy [name]. [name] Date of Encounter: 05/10/2022  [facility] Cardiologist: None   Subjective   Net even yesterday but with unmeasured UOP.  Weight is down 8 pounds since admit.  Creatinine stable at 0.8.  Reports dyspnea improved but did get short of breath with walking  Inpatient Medications    Scheduled Meds:  aspirin  81 mg Oral Daily   atorvastatin  40 mg Oral QPM   clopidogrel  75 mg Oral Daily   docusate sodium  100 mg Oral BID   enoxaparin (LOVENOX) injection  40 mg Subcutaneous Q24H   feeding supplement  1 Container Oral TID BM   furosemide  40 mg Oral Daily   ipratropium-albuterol  3 mL Inhalation TID   levETIRAcetam  500 mg Oral BID   losartan  25 mg Oral Daily   melatonin  10 mg Oral QHS   metoprolol succinate  50 mg Oral Daily   mirtazapine  15 mg Oral QHS   pantoprazole  40 mg Oral Daily   polyvinyl alcohol  1 drop Both Eyes Daily   sertraline  50 mg Oral Daily   sodium chloride flush  3 mL Intravenous Q12H   Continuous Infusions:  sodium chloride     PRN Meds: sodium chloride, acetaminophen, ALPRAZolam, calcium carbonate, guaiFENesin, hydrALAZINE, ipratropium-albuterol, metoprolol tartrate, ondansetron (ZOFRAN) IV, polyethylene glycol, sodium chloride flush, traMADol, traZODone, zolpidem   Vital Signs    Vitals:   05/09/22 2127 05/10/22 0455 05/10/22 0810 05/10/22 0855  BP:  132/79 (!) 119/59   Pulse:  79 77   Resp:  20    Temp:  98.3 F (36.8 C) 99 F (37.2 C)   TempSrc:  Oral Oral   SpO2: 98% 95% 95% 95%  Weight:  42.6 kg    Height:        Intake/Output Summary (Last 24 hours) at 05/10/2022 0939 Last data filed at 05/10/2022 0459 Gross per 24 hour  Intake 477 ml  Output 800 ml  Net -323 ml       05/10/2022    4:55 AM 05/09/2022    4:21 AM 05/08/2022    6:52 AM  Last 3 Weights  Weight (lbs) 93 lb 14.7 oz 95 lb 14.4 oz 100 lb 12 oz  Weight (kg) 42.6 kg 43.5 kg 45.7 kg      Telemetry    Normal sinus  rhythm in 70s- Personally Reviewed  ECG    No new EKG- Personally Reviewed  Physical Exam   GEN: No acute distress.   Neck: No JVD Cardiac: RRR, no murmurs, rubs, or gallops.  Respiratory: CTAB GI: Soft, nontender, non-distended  MS: No edema; Neuro:  Nonfocal  Psych: Normal affect   Labs    High Sensitivity Troponin:   Recent Labs  Lab 05/06/22 1649 05/06/22 1838  TROPONINIHS 30* 31*      Chemistry Recent Labs  Lab 05/06/22 1649 05/06/22 1721 05/08/22 0459 05/09/22 0124 05/10/22 0402  NA 134*   < > 133* 132* 134*  K 3.8   < > 3.6 4.2 3.6  CL 100   < > 94* 95* 94*  CO2 26   < > 29 28 33*  GLUCOSE 130*   < > 97 92 93  BUN 12   < > '13 14 11  '$ CREATININE 0.70   < > 0.77 0.74 0.83  CALCIUM 8.4*   < > 7.9* 8.0* 7.9*  MG 1.8  --  1.7 2.3 2.0  PROT 5.6*  --   --   --   --   ALBUMIN 2.7*  --   --   --   --   AST 28  --   --   --   --   ALT 21  --   --   --   --   ALKPHOS 286*  --   --   --   --   BILITOT 0.9  --   --   --   --   GFRNONAA >60   < > >60 >60 >60  ANIONGAP 8   < > '10 9 7   '$ < > = values in this interval not displayed.     Lipids  Recent Labs  Lab 05/09/22 0124  CHOL 118  TRIG 101  HDL 33*  LDLCALC 65  CHOLHDL 3.6     Hematology Recent Labs  Lab 05/06/22 1649 05/06/22 1721  WBC 10.3  --   RBC 3.72*  --   HGB 11.2* 11.9*  HCT 34.3* 35.0*  MCV 92.2  --   MCH 30.1  --   MCHC 32.7  --   RDW 15.5  --   PLT 496*  --     Thyroid No results for input(s): TSH, FREET4 in the last 168 hours.  BNP Recent Labs  Lab 05/06/22 1635  BNP 2,404.2*     DDimer No results for input(s): DDIMER in the last 168 hours.   Radiology    MR CARDIAC MORPHOLOGY W WO CONTRAST  Result Date: 05/09/2022 CLINICAL DATA:  Amyloid evaluation EXAM: CARDIAC MRI TECHNIQUE: The patient was scanned on a 1.5 Tesla Siemens magnet. A dedicated cardiac coil was used. Functional imaging was done using Fiesta sequences. 2,3, and 4 chamber views were done to assess for  RWMA's. Modified Simpson's rule using a short axis stack was used to calculate an ejection fraction on a dedicated work Conservation officer, nature. The patient received cc of Gadavist. After 10 minutes inversion recovery sequences were used to assess for infiltration and scar tissue. CONTRAST:  cc  of Gadavist FINDINGS: Left ventricle: -Normal size -Normal systolic function -Asymmetric hypertrophy measuring up to 74m in basal septum (769min posterior wall) -ECV significantly elevated (38%) -No LGE LV EF: 56% (Normal 56-78%) Absolute volumes: LV EDV: 7856mNormal 77-195 mL) LV ESV: 46m79mormal 19-72 mL) LV SV: 44mL88mrmal 51-133 mL) CO: 3.0L/min (Normal 2.8-8.8 L/min) Indexed volumes: LV EDV: 57mL/85m (Normal 47-92 mL/sq-m) LV ESV: 25mL/s60m(Normal 13-30 mL/sq-m) LV SV: 32mL/sq33mNormal 32-62 mL/sq-m) CI: 2.2L/min/sq-m (Normal 1.7-4.2 L/min/sq-m) Right ventricle: Normal size and systolic function RV EF:  55% (Normal 47-74%) Absolute volumes: RV EDV: 77mL (No55m 88-227 mL) RV ESV: 46mL (Nor71m23-103 mL) RV SV: 42mL (Norm60m2-138 mL) CO: 2.9L/min (Normal 2.8-8.8 L/min) Indexed volumes: RV EDV: 56mL/sq-m (59mal 55-105 mL/sq-m) RV ESV: 25mL/sq-m (N70ml 15-43 mL/sq-m) RV SV: 31mL/sq-m (No31m 32-64 mL/sq-m) CI: 2.1L/min/sq-m (Normal 1.7-4.2 L/min/sq-m) Left atrium: Mild enlargement Right atrium: Normal size Mitral valve: Trivial regurgitation Aortic valve: No regurgitation Tricuspid valve: Mild regurgitation Pulmonic valve: No regurgitation Aorta: Normal proximal ascending aorta Pericardium: Small effusion Extracardiac structures: Moderate right, small left pleural effusion IMPRESSION: 1. ECV significantly elevated (38%) with moderate asymmetric LV hypertrophy. While ECV is not quite in amyloid range (>40%) and there is no LGE, ECV elevation precedes development of LGE in cardiac amyloid. 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  Normal LV size and systolic function (56%) 3. Normal21% size  and systolic function (86%) Electronically Signed   By: Oswaldo Milian M.D.   On: 05/09/2022 23:07    Cardiac Studies   Echo 05/07/22:  1. Left ventricular ejection fraction, by estimation, is 55 to 60%. The  left ventricle has normal function. The left ventricle has no regional  wall motion abnormalities. There is mild concentric left ventricular  hypertrophy. Left ventricular diastolic  parameters are consistent with Grade II diastolic dysfunction  (pseudonormalization).   2. Right ventricular systolic function is normal. The right ventricular  size is normal. There is moderately elevated pulmonary artery systolic  pressure.   3. Left atrial size was mildly dilated.   4. A small pericardial effusion is present. The pericardial effusion is  circumferential.   5. The mitral valve is normal in structure. Mild mitral valve  regurgitation. No evidence of mitral stenosis.   6. The aortic valve is normal in structure. Aortic valve regurgitation is  trivial. Aortic valve sclerosis/calcification is present, without any  evidence of aortic stenosis.   7. The inferior vena cava is normal in size with greater than 50%  respiratory variability, suggesting right atrial pressure of 3 mmHg.   Patient Profile     76 y.o. female with history of AL amyloidosis, CAD, hypertension, DVT, seizures, CVA who we are consulted for evaluation of suspected heart failure  Assessment & Plan    Acute on chronic diastolic heart failure: Presented with shortness of breath, initial SPO2 in 80s.  CTPA showed no PE, moderate bilateral pleural effusions.  BNP 2404.  Echo 5/15 shows EF 55 to 76%, grade 2 diastolic dysfunction, normal RV function, no significant valvular disease. -Strict I/O's and daily weight -Volume status and symptoms appear improved.  Transition to p.o. Lasix 40 mg daily -Cardiac MRI with findings suggestive of early cardiac amyloidosis  Acute hypoxic respiratory failure: Required 4 L [place] on  admission.  Likely due to heart failure as above.  Now on room air  Pleural effusion: remains moderate on R despite diuresis.  She is having some DOE, would consider thoracentesis  Troponin elevation: 30 > 31.  Not consistent with acute coronary syndrome.  Suspect due to demand ischemia in setting of decompensated heart failure as above.  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  Appears controlled  CVA: In 02/2022.  On aspirin, Plavix, statin  Hyperlipidemia: On atorvastatin 40 mg daily.  LDL 65   For questions or updates, please contact [place] Please consult [email or web address] for contact info under        Signed, Donato Heinz, MD  05/10/2022, 9:39 AM

## 2022-05-10 NOTE — Progress Notes (Signed)
Occupational Therapy Treatment Patient Details Name: Wendy [name]. Wendy [name] MRN: [id number] DOB: July 29, 1946 Today's Date: 05/10/2022   History of present illness Pt is a 76 y/o female admitted 05/06/22 secondary to CHF exacerbation. PMH includes HTN, multiple myeloma, CVA, DVT, and hep A.   OT comments  Pt. Nurse asked to check and report O2 sats without O2. Pt. O2 sitting EOB for 97 and with standing for 2 to 3 min it dropped to 80. Pt. Sat and was able to rebound quickly to 96. Pt. Stood a second time and O2 dropped to 90 on room and after one minute and pt. Asked to sit. She rebounded to 96 quickly. Pt. Was MIn A with stand pivot to recliner chiar. Pt. States she feels weak and does want rehab when she returns to [place]. She states they have OT in house. Acute OT to follow.    Recommendations for follow up therapy are one component of a multi-disciplinary discharge planning process, led by the attending physician.  Recommendations may be updated based on patient status, additional functional criteria and insurance authorization.    Follow Up Recommendations  Home health OT (Pt. is weak and she suggested she wants to go to short term snf prior to dc home.)    Assistance Recommended at Discharge Frequent or constant Supervision/Assistance  Patient can return home with the following  A lot of help with walking and/or transfers;A lot of help with bathing/dressing/bathroom;Assist for transportation;Assistance with cooking/housework   Equipment Recommendations  None recommended by OT    Recommendations for Other Services      Precautions / Restrictions Precautions Precautions: Fall Restrictions Weight Bearing Restrictions: No       Mobility Bed Mobility         Supine to sit: Min assist, HOB elevated          Transfers Overall transfer level: Needs assistance Equipment used: Rolling walker (2 wheels) Transfers: Sit to/from Stand Sit to Stand: Min assist Stand pivot transfers:  Min assist         General transfer comment: cues for proper hand placement. Pt. leans poterior during standing.     Balance     Sitting balance-Leahy Scale: Fair       Standing balance-Leahy Scale: Poor                             ADL either performed or assessed with clinical judgement   ADL Overall ADL's : Needs assistance/impaired Eating/Feeding: Independent   Grooming: Wash/dry hands;Wash/dry face;Supervision/safety;Set up;Sitting   Upper Body Bathing: Minimal assistance;Sitting   Lower Body Bathing: Sit to/from stand;Maximal assistance   Upper Body Dressing : Minimal assistance;Sitting   Lower Body Dressing: Maximal assistance;Sit to/from stand   Toilet Transfer: Minimal assistance;Rolling walker (2 wheels)   Toileting- Clothing Manipulation and Hygiene: Maximal assistance       Functional mobility during ADLs: Minimal assistance (Pt. reports she feels weak today.) General ADL Comments: Pt. was able to sit eob for ADLs today.    Extremity/Trunk Assessment Upper Extremity Assessment Upper Extremity Assessment: Generalized weakness   Lower Extremity Assessment Lower Extremity Assessment: Defer to PT evaluation        Vision   Vision Assessment?: No apparent visual deficits   Perception     Praxis      Cognition Arousal/Alertness: Awake/alert Behavior During Therapy: WFL for tasks assessed/performed Overall Cognitive Status: Within Functional Limits for tasks assessed  General Comments: Follows commands appropriately. Needs increased time to process or sequence, but likely baseline.        Exercises      Shoulder Instructions       General Comments O2 is dropping into 80 with minimal activity. Pt. unable to amb today secondary to pt stating she felf too weak. Pt. nurse informed of O2 sats.    Pertinent Vitals/ Pain       Pain Assessment Pain Assessment: No/denies pain  Home  Living Family/patient expects to be discharged to:: Assisted living Living Arrangements: Alone                           Home Equipment: Conservation officer, nature (2 wheels);Wheelchair - manual   Additional Comments: [place]      Prior Functioning/Environment              Frequency  Min 2X/week        Progress Toward Goals  OT Goals(current goals can now be found in the care plan section)  Progress towards OT goals: Progressing toward goals  Acute Rehab OT Goals Patient Stated Goal: wants to go to rehab prior to dc home. OT Goal Formulation: With patient Time For Goal Achievement: 05/21/22 Potential to Achieve Goals: Good ADL Goals Pt Will Perform Grooming: with supervision;standing Pt Will Perform Lower Body Dressing: with supervision;sit to/from stand Pt Will Transfer to Toilet: with supervision;ambulating;bedside commode Pt Will Perform Toileting - Clothing Manipulation and hygiene: with supervision;sit to/from stand  Plan Discharge plan remains appropriate    Co-evaluation                 AM-PAC OT "6 Clicks" Daily Activity     Outcome Measure   Help from another person eating meals?: None Help from another person taking care of personal grooming?: A Little Help from another person toileting, which includes using toliet, bedpan, or urinal?: A Lot Help from another person bathing (including washing, rinsing, drying)?: A Lot Help from another person to put on and taking off regular upper body clothing?: A Little Help from another person to put on and taking off regular lower body clothing?: A Lot 6 Click Score: 16    End of Session Equipment Utilized During Treatment: Rolling walker (2 wheels)  OT Visit Diagnosis: Other abnormalities of gait and mobility (R26.89);Unsteadiness on feet (R26.81);Muscle weakness (generalized) (M62.81)   Activity Tolerance Patient limited by fatigue;Other (comment) (o2 sats drop with minimal activity.)   Patient  Left with call bell/phone within reach;in chair;with chair alarm set   Nurse Communication  (o2 sats)        Time: 8453-6468 OT Time Calculation (min): 31 min  Charges: OT General Charges $OT Visit: 1 Visit OT Treatments $Self Care/Home Management : 23-37 mins  Reece Packer OT/L   Oland Arquette 05/10/2022, 10:16 AM

## 2022-05-10 NOTE — Progress Notes (Signed)
Patient to weak to attempt oxygen ambulation trial. Please defer to tomorrow

## 2022-05-10 NOTE — Progress Notes (Signed)
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    Recent admission in March for acute respiratory failure requiring intubation with incidental finding of new diastolic CHF (LVEF 66-06% on TTE, 50-55% on TEE) with mild LVH and moderate RV dysfunction.    She presented to ED from [facility] 05/14 complaining of progressive dyspnea with O2 sats in 80s on RA per EMS.  Patient reports that she only weighs herself weekly.  CTA negative for PE, evidence of stable bilateral pleural effusions and biatrial dilation.  CXR with vascular congestion.  Elevated BNP of 2404 along with JVD and 2+ edema on exam all consistent with CHF exacerbation.  Echo this admission with LVEF 55-60% slightly reduced from recent March hospitalization with new G2DD, moderately elevated pulmonary pressures and now normal RV function.  Started on IV diuresis.  Of note, patient is taking amlodipine 5 mg daily at home.   Good response to IV diuresis - dyspnea improving.  Cardiac MRI 05/17 with no LGE but elevated ECV 35%. Findings not indicative of amyloid (ECV > 40%, presence of LGE) but consistent with early cardiac amyloid.  Patient developed orthostasis 05/18 after addition of losartan 25 mg daily  - BP 106/62 sitting > 86/63 standing.  AM BP meds and diuretic held.   Persistent moderate pleural effusion despite diuresis, underwent successful R thoracentesis with yield of 400cc clear yellow fluid.   Current HF Medications: Diuretic: furosemide PO 40 mg daily Beta Blocker: metoprolol succinate 50 mg daily ACE/ARB/ARNI: losartan 25 mg daily  Prior to admission HF Medications: Beta blocker: metoprolol tartrate 25 mg twice daily  Pertinent Lab Values: Serum creatinine 0.83, BUN 11, Potassium 3.6, Sodium 134, BNP 2402,  Magnesium 2, A1c 6% (02/2022)  Vital Signs: Weight: 93 lbs; (admission weight: 100 lbs) Dry weight unclear - discharge weight 111lb in March Blood pressure: 100-20s/50-60s Heart rate: 70s  I/O: -1L yesterday; net -2.1L  Medication Assistance / Insurance Benefits Check: Does the patient have prescription insurance?  Yes Type of insurance plan: UHC Medicare - Delene Loll and [place] copays >$100 due to donut hole  Outpatient Pharmacy:  Prior to admission [facility] Is the patient willing to use [facility] at discharge? Pending Is the patient willing to transition their outpatient pharmacy to utilize a [facility] outpatient pharmacy?   Pending    Assessment: 1. Acute on chronic diastolic CHF (LVEF 30-16%). NYHA class IV symptoms. - Follow plans for cardiac MRI when euvolemic  - Continue diuresis with furosemide PO 40 mg daily - dyspnea significantly improved - Continue metoprolol succinate 50 mg daily - Continue losartan 25 mg daily. Will hold off on switching to [facility] with new orthostasis. - No MRA/SGLTi with labile BP with diuresis   Plan: 1) Medication changes recommended at this time: - Consider switch to Entresto 24-26 mg twice daily if BP improves - Eventually start SGLT2i and spironolactone as BP permits.   2) Patient assistance: - pending patient discussion - briefly spoke with patient before going to MRI. Didn't think she would financially qualify for assistance.  3)  Education  - To be completed prior to discharge - Reinforce importance of daily weights and fluid restriction  Laurey Arrow, PharmD PGY1 Pharmacy Resident 05/10/2022  9:09 AM

## 2022-05-10 NOTE — Plan of Care (Signed)

## 2022-05-10 NOTE — Progress Notes (Signed)
PROGRESS NOTE    Wendy [name]  HWE:[id number] DOB: [birth year] DOA: 05/06/2022 PCP: Abner Greenspan, MD   Brief Narrative:  76 year old with history of HTN, multiple myeloma, AL amyloidosis seeing outpatient oncology undergoing immunotherapy comes to the hospital with progressive dyspnea on exertion found to be in CHF.  Patient was admitted recently to the hospital for similar reason complicated by multifocal CVA.  Patient was started on IV diuretics with plans to get cardiac MRI on 5/17.     Assessment & Plan:  Principal Problem:   Acute on chronic heart failure with preserved ejection fraction (HFpEF) (HCC) Active Problems:   Acute respiratory failure (HCC)   Light chain (AL) amyloidosis (HCC)   Essential hypertension   Multiple myeloma (HCC)   Acute heart failure (HCC)   Pleural effusion     Assessment and Plan: Acute on chronic heart failure with preserved ejection fraction (HFpEF) (HCC) Echocardiogram March 2023-EF 50%, PAH Pt with known light chain amyloidosis disease. -Cardiology following: cardiac MRI- early amyloid - Home meds-aspirin/Plavix, metoprolol twice daily -holding BP meds this AM as BP low  Acute respiratory failure (HCC) Moderate bilateral pleural effusion Stable bilateral pulmonary nodule -Secondary to CHF exacerbation - CTA chest negative for PE -IR consult for thoracentesis   Light chain (AL) amyloidosis ([place]) -Follows with outpatient oncology team.  Cardiac MRI done  Essential hypertension -BP low so medications being held  Multiple myeloma ([place]) -Pathologic fracture of manubrium.  Follows outpatient oncology  History of CVA - CVA in March.  Currently on aspirin Plavix and statin  History of seizure - On Keppra 500 mg twice daily  Severe protein calorie malnutrition - Dietitian consult  DVT prophylaxis: Lovenox Code Status: Full code Back to ALF if BP better and thoracentesis    Subjective: Got very weak getting up this  AM -unsteady on feet and hypoxic  Examination:   General: Appearance:    Thin female in no acute distress     Lungs:      respirations unlabored  Heart:    Normal heart rate.  MS:   All extremities are intact.   Neurologic:   Awake, alert        Objective: Vitals:   05/10/22 0855 05/10/22 0956 05/10/22 1033 05/10/22 1219  BP:  (!) 92/52 (!) 94/56 118/64  Pulse:  79 79   Resp:   16 18  Temp:   98.4 F (36.9 C) 98.3 F (36.8 C)  TempSrc:   Oral Oral  SpO2: 95%  95% 98%  Weight:      Height:        Intake/Output Summary (Last 24 hours) at 05/10/2022 1242 Last data filed at 05/10/2022 1033 Gross per 24 hour  Intake 477 ml  Output 525 ml  Net -48 ml   Filed Weights   05/08/22 0652 05/09/22 0421 05/10/22 0455  Weight: 45.7 kg 43.5 kg 42.6 kg     Data Reviewed:   CBC: Recent Labs  Lab 05/06/22 1649 05/06/22 1721  WBC 10.3  --   NEUTROABS 8.4*  --   HGB 11.2* 11.9*  HCT 34.3* 35.0*  MCV 92.2  --   PLT 496*  --    Basic Metabolic Panel: Recent Labs  Lab 05/06/22 1649 05/06/22 1721 05/07/22 0415 05/08/22 0459 05/09/22 0124 05/10/22 0402  NA 134* 134* 135 133* 132* 134*  K 3.8 3.8 4.4 3.6 4.2 3.6  CL 100  --  100 94* 95* 94*  CO2 26  --  '25 29 28 ' 33*  GLUCOSE 130*  --  170* 97 92 93  BUN 12  --  '12 13 14 11  ' CREATININE 0.70  --  0.78 0.77 0.74 0.83  CALCIUM 8.4*  --  8.2* 7.9* 8.0* 7.9*  MG 1.8  --   --  1.7 2.3 2.0   GFR: Estimated Creatinine Clearance: 38.2 mL/min (by C-G formula based on SCr of 0.83 mg/dL). Liver Function Tests: Recent Labs  Lab 05/06/22 1649  AST 28  ALT 21  ALKPHOS 286*  BILITOT 0.9  PROT 5.6*  ALBUMIN 2.7*   No results for input(s): LIPASE, AMYLASE in the last 168 hours. No results for input(s): AMMONIA in the last 168 hours. Coagulation Profile: No results for input(s): INR, PROTIME in the last 168 hours. Cardiac Enzymes: No results for input(s): CKTOTAL, CKMB, CKMBINDEX, TROPONINI in the last 168 hours. BNP  (last 3 results) No results for input(s): PROBNP in the last 8760 hours. HbA1C: No results for input(s): HGBA1C in the last 72 hours. CBG: No results for input(s): GLUCAP in the last 168 hours. Lipid Profile: Recent Labs    05/09/22 0124  CHOL 118  HDL 33*  LDLCALC 65  TRIG 101  CHOLHDL 3.6   Thyroid Function Tests: No results for input(s): TSH, T4TOTAL, FREET4, T3FREE, THYROIDAB in the last 72 hours. Anemia Panel: No results for input(s): VITAMINB12, FOLATE, FERRITIN, TIBC, IRON, RETICCTPCT in the last 72 hours. Sepsis Labs: No results for input(s): PROCALCITON, LATICACIDVEN in the last 168 hours.  Recent Results (from the past 240 hour(s))  Resp Panel by RT-PCR (Flu A&B, Covid) Nasopharyngeal Swab     Status: None   Collection Time: 05/06/22  4:37 PM   Specimen: Nasopharyngeal Swab; Nasopharyngeal(NP) swabs in vial transport medium  Result Value Ref Range Status   SARS Coronavirus 2 by RT PCR NEGATIVE NEGATIVE Final    Comment: (NOTE) SARS-CoV-2 target nucleic acids are NOT DETECTED.  The SARS-CoV-2 RNA is generally detectable in upper respiratory specimens during the acute phase of infection. The lowest concentration of SARS-CoV-2 viral copies this assay can detect is 138 copies/mL. A negative result does not preclude SARS-Cov-2 infection and should not be used as the sole basis for treatment or other patient management decisions. A negative result may occur with  improper specimen collection/handling, submission of specimen other than nasopharyngeal swab, presence of viral mutation(s) within the areas targeted by this assay, and inadequate number of viral copies(<138 copies/mL). A negative result must be combined with clinical observations, patient history, and epidemiological information. The expected result is Negative.  Fact Sheet for Patients:  EntrepreneurPulse.com.au  Fact Sheet for Healthcare Providers:   IncredibleEmployment.be  This test is no t yet approved or cleared by the [place] FDA and  has been authorized for detection and/or diagnosis of SARS-CoV-2 by FDA under an Emergency Use Authorization (EUA). This EUA will remain  in effect (meaning this test can be used) for the duration of the COVID-19 declaration under Section 564(b)(1) of the Act, 21 U.S.C.section 360bbb-3(b)(1), unless the authorization is terminated  or revoked sooner.       Influenza A by PCR NEGATIVE NEGATIVE Final   Influenza B by PCR NEGATIVE NEGATIVE Final    Comment: (NOTE) The Xpert Xpress SARS-CoV-2/FLU/RSV plus assay is intended as an aid in the diagnosis of influenza from Nasopharyngeal swab specimens and should not be used as a sole basis for treatment. Nasal washings and aspirates are unacceptable for Xpert Xpress SARS-CoV-2/FLU/RSV testing.  Fact Sheet for Patients: EntrepreneurPulse.com.au  Fact Sheet for Healthcare Providers: IncredibleEmployment.be  This test is not yet approved or cleared by the [place] FDA and has been authorized for detection and/or diagnosis of SARS-CoV-2 by FDA under an Emergency Use Authorization (EUA). This EUA will remain in effect (meaning this test can be used) for the duration of the COVID-19 declaration under Section 564(b)(1) of the Act, 21 U.S.C. section 360bbb-3(b)(1), unless the authorization is terminated or revoked.  Performed at [facility], [place] [address], [place], [place] [address]          Radiology Studies: DG Chest 1 View  Result Date: 05/10/2022 CLINICAL DATA:  Status post right thoracentesis EXAM: CHEST  1 VIEW COMPARISON:  05/06/2022 FINDINGS: Transverse diameter of heart is increased. Central pulmonary vessels are less prominent. There are no signs of alveolar pulmonary edema. There is interval decrease in right pleural effusion. Small bilateral pleural effusions are  seen, more so on the left side. There is no pneumothorax. There is evidence of previous vertebroplasty in multiple lower thoracic and upper lumbar vertebral bodies. IMPRESSION: There is interval decrease in pulmonary vascular congestion. There is interval decrease in right pleural effusion. There is no pneumothorax. Electronically Signed   By: Elmer Picker M.D.   On: 05/10/2022 12:19   MR CARDIAC MORPHOLOGY W WO CONTRAST  Result Date: 05/09/2022 CLINICAL DATA:  Amyloid evaluation EXAM: CARDIAC MRI TECHNIQUE: The patient was scanned on a 1.5 Tesla Siemens magnet. A dedicated cardiac coil was used. Functional imaging was done using Fiesta sequences. 2,3, and 4 chamber views were done to assess for RWMA's. Modified Simpson's rule using a short axis stack was used to calculate an ejection fraction on a dedicated work Conservation officer, nature. The patient received cc of Gadavist. After 10 minutes inversion recovery sequences were used to assess for infiltration and scar tissue. CONTRAST:  cc  of Gadavist FINDINGS: Left ventricle: -Normal size -Normal systolic function -Asymmetric hypertrophy measuring up to 20m in basal septum (729min posterior wall) -ECV significantly elevated (38%) -No LGE LV EF: 56% (Normal 56-78%) Absolute volumes: LV EDV: 7866mNormal 77-195 mL) LV ESV: 66m35mormal 19-72 mL) LV SV: 44mL55mrmal 51-133 mL) CO: 3.0L/min (Normal 2.8-8.8 L/min) Indexed volumes: LV EDV: 57mL/43m (Normal 47-92 mL/sq-m) LV ESV: 25mL/s7m(Normal 13-30 mL/sq-m) LV SV: 32mL/sq73mNormal 32-62 mL/sq-m) CI: 2.2L/min/sq-m (Normal 1.7-4.2 L/min/sq-m) Right ventricle: Normal size and systolic function RV EF:  55% (Normal 47-74%) Absolute volumes: RV EDV: 77mL (No56m 88-227 mL) RV ESV: 66mL (Nor64m23-103 mL) RV SV: 42mL (Norm35m2-138 mL) CO: 2.9L/min (Normal 2.8-8.8 L/min) Indexed volumes: RV EDV: 56mL/sq-m (15mal 55-105 mL/sq-m) RV ESV: 25mL/sq-m (N38ml 15-43 mL/sq-m) RV SV: 31mL/sq-m (No47m 32-64  mL/sq-m) CI: 2.1L/min/sq-m (Normal 1.7-4.2 L/min/sq-m) Left atrium: Mild enlargement Right atrium: Normal size Mitral valve: Trivial regurgitation Aortic valve: No regurgitation Tricuspid valve: Mild regurgitation Pulmonic valve: No regurgitation Aorta: Normal proximal ascending aorta Pericardium: Small effusion Extracardiac structures: Moderate right, small left pleural effusion IMPRESSION: 1. ECV significantly elevated (38%) with moderate asymmetric LV hypertrophy. While ECV is not quite in amyloid range (>40%) and there is no LGE, ECV elevation precedes development of LGE in cardiac amyloid. 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  Normal LV size and systolic function (56%) 3. Normal33% size and systolic function (55%) Electroni35%ly Signed   By: Christopher  SOswaldo Milian/17/2023 23:07        Scheduled Meds:  aspirin  81  mg Oral Daily   atorvastatin  40 mg Oral QPM   clopidogrel  75 mg Oral Daily   docusate sodium  100 mg Oral BID   enoxaparin (LOVENOX) injection  40 mg Subcutaneous Q24H   feeding supplement  1 Container Oral TID BM   furosemide  40 mg Oral Daily   ipratropium-albuterol  3 mL Inhalation TID   levETIRAcetam  500 mg Oral BID   losartan  25 mg Oral Daily   melatonin  10 mg Oral QHS   metoprolol succinate  50 mg Oral Daily   mirtazapine  15 mg Oral QHS   pantoprazole  40 mg Oral Daily   polyvinyl alcohol  1 drop Both Eyes Daily   sertraline  50 mg Oral Daily   sodium chloride flush  3 mL Intravenous Q12H   Continuous Infusions:  sodium chloride       LOS: 3 days   Time spent: 35 mins    Geradine Girt, DO [facility]  If 7PM-7AM, please contact night-coverage  05/10/2022, 12:42 PM

## 2022-05-11 ENCOUNTER — Telehealth: Payer: Self-pay

## 2022-05-11 DIAGNOSIS — E8581 Light chain (AL) amyloidosis: Secondary | ICD-10-CM | POA: Diagnosis not present

## 2022-05-11 DIAGNOSIS — I10 Essential (primary) hypertension: Secondary | ICD-10-CM | POA: Diagnosis not present

## 2022-05-11 DIAGNOSIS — I5033 Acute on chronic diastolic (congestive) heart failure: Secondary | ICD-10-CM | POA: Diagnosis not present

## 2022-05-11 LAB — BASIC METABOLIC PANEL
Anion gap: 8 (ref 5–15)
BUN: 14 mg/dL (ref 8–23)
CO2: 30 mmol/L (ref 22–32)
Calcium: 8.1 mg/dL — ABNORMAL LOW (ref 8.9–10.3)
Chloride: 97 mmol/L — ABNORMAL LOW (ref 98–111)
Creatinine, Ser: 0.91 mg/dL (ref 0.44–1.00)
GFR, Estimated: >60 mL/min (ref >60)
Glucose, Bld: 94 mg/dL (ref 70–99)
Potassium: 4.5 mmol/L (ref 3.5–5.1)
Sodium: 135 mmol/L (ref 135–145)

## 2022-05-11 LAB — MAGNESIUM: Magnesium: 2 mg/dL (ref 1.7–2.4)

## 2022-05-11 LAB — PATHOLOGIST SMEAR REVIEW: Path Review: NEGATIVE

## 2022-05-11 MED ORDER — FUROSEMIDE 40 MG PO TABS: 40.0000 mg | ORAL_TABLET | Freq: Every day | ORAL | 0 refills | Status: AC

## 2022-05-11 MED ORDER — LOSARTAN POTASSIUM 25 MG PO TABS: 25.0000 mg | ORAL_TABLET | Freq: Every day | ORAL | 0 refills | Status: AC

## 2022-05-11 MED ORDER — LEVETIRACETAM 500 MG PO TABS
500.0000 mg | ORAL_TABLET | Freq: Two times a day (BID) | ORAL | Status: DC
  Administered 2022-05-11: 500 mg via ORAL
  Filled 2022-05-11: qty 1

## 2022-05-11 MED ORDER — METOPROLOL SUCCINATE ER 50 MG PO TB24: 50.0000 mg | ORAL_TABLET | Freq: Every day | ORAL | 0 refills | Status: AC

## 2022-05-11 NOTE — TOC Transition Note (Addendum)
Transition of Care [facility]) - CM/SW Discharge Note   Patient Details  Name: Wendy [name] MRN: [id number] Date of Birth: Aug 27, 1946  [facility] CM/SW Contact:  Tresa Endo Phone Number: 05/11/2022, 11:11 AM   Clinical Narrative:    Patient will DC to: [facility] Anticipated DC date: 05/11/2022 Family notified: PT Daughter (POA) Transport by: Corey Harold   Per MD patient ready for DC to [facility] RN to call report prior to discharge [phone]). RN, patient, patient's family, and facility notified of DC. Discharge Summary and FL2 sent to facility. DC packet on chart. Ambulance transport requested for patient.   CSW will sign off for now as social work intervention is no longer needed. Please consult [place] again if new needs arise.     Final next level of care: Assisted Living Barriers to Discharge: Continued Medical Work up   Patient Goals and CMS Choice Patient states their goals for this hospitalization and ongoing recovery are:: Return to ALF CMS Medicare.gov Compare Post Acute Care list provided to:: Patient Choice offered to / list presented to : Patient  Discharge Placement                       Discharge Plan and Services In-house Referral: Clinical Social Work   Post Acute Care Choice: NA                               Social Determinants of Health (SDOH) Interventions Food Insecurity Interventions: Intervention Not Indicated Financial Strain Interventions: Intervention Not Indicated Housing Interventions: Intervention Not Indicated Transportation Interventions: Intervention Not Indicated   Readmission Risk Interventions     View : No data to display.

## 2022-05-11 NOTE — NC FL2 (Signed)
[place] LEVEL OF CARE SCREENING TOOL     IDENTIFICATION  Patient Name: Wendy [name] Birthdate: [birth year] Sex: female Admission Date (Current Location): 05/06/2022  [facility] and [place] Number:  Herbalist and Address:  [facility], [place] [address] [place], [place] [address]      Provider Number: [id number]  Attending Physician Name and Address:  Damita Lack, MD  Relative Name and Phone Number:  Darnell Level [facility]) (Daughter)   [phone]    Current Level of Care: Hospital Recommended Level of Care: [place] Prior Approval Number:    Date Approved/Denied:   PASRR Number:    Discharge Plan: Other (Comment) (ALF)    Current Diagnoses: Patient Active Problem List   Diagnosis Date Noted   Pleural effusion    Acute on chronic heart failure with preserved ejection fraction (HFpEF) ([place]) 05/06/2022   Acute CVA (cerebrovascular accident) ([place]) 03/16/2022   Seizure ([place]) 03/16/2022   Protein-calorie malnutrition, severe 03/12/2022   Acute heart failure ([place])    Altered mental status    Acute respiratory failure ([place]) 03/11/2022   Pain in buttock 02/09/2022   Low back pain 02/09/2022   Poor balance 02/09/2022   History of fall 02/09/2022   Generalized weakness 02/09/2022   Stress reaction 12/24/2021   Current use of proton pump inhibitor 10/27/2021   Full code status 05/31/2021   Flash pulmonary edema ([place]) 04/30/2021   Heme positive stool 04/30/2021   Hyperkalemia 04/30/2021   Compression fracture of T10 vertebra ([place]) 03/14/2021   History of DVT (deep vein thrombosis) 03/13/2021   Multiple myeloma ([place]) 02/10/2021   Leucocytosis 02/10/2021   Light chain (AL) amyloidosis ([place]) 02/03/2021   Situational anxiety 01/11/2021   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   Hyperlipidemia 10/19/2020   Hearing loss 06/14/2020   Pedal edema 06/01/2020   Aortic atherosclerosis ([place]) 04/06/2020   CAD (coronary  artery disease) 04/06/2020   H/O compression fracture of spine 04/06/2020   Chronic back pain 04/06/2020   Abnormal CT scan of lung 03/17/2020   Heartburn 06/02/2018   Chronic constipation 12/31/2017   Blood glucose elevated 09/25/2017   History of ileus 06/07/2017   Right carpal tunnel syndrome 12/07/2016   Estrogen deficiency 09/24/2016   Routine general medical examination at a health care facility 09/04/2015   Colon cancer screening 12/11/2014   Encounter for Medicare annual wellness exam 05/17/2013   Osteoarthritis 03/28/2011   Degenerative disc disease, lumbar 03/28/2011   Hyponatremia 02/12/2011   Essential hypertension 08/01/2010   Osteoporosis 08/01/2010    Orientation RESPIRATION BLADDER Height & Weight     Self, Time, Situation, Place  Normal Incontinent, External catheter Weight: 89 lb 11.6 oz (40.7 kg) Height:  _0  (157.5 cm)  BEHAVIORAL SYMPTOMS/MOOD NEUROLOGICAL BOWEL NUTRITION STATUS      Continent Diet (See DC Summary)  AMBULATORY STATUS COMMUNICATION OF NEEDS Skin   Limited Assist Verbally Skin abrasions                       Personal Care Assistance Level of Assistance  Bathing, Feeding, Dressing Bathing Assistance: Limited assistance Feeding assistance: Independent Dressing Assistance: Limited assistance     Functional Limitations Info  Sight, Hearing, Speech Sight Info: Impaired Hearing Info: Adequate Speech Info: Adequate    SPECIAL CARE FACTORS FREQUENCY  PT (By licensed PT)     PT Frequency: 3x a week  Contractures Contractures Info: Not present    Additional Factors Info  Code Status, Allergies Code Status Info: Full Allergies Info: Bentyl (Dicyclomine Hcl)   Butalbital-aspirin-caffeine   Clonidine Derivatives   Linzess (Linaclotide)   Morphine And Related   Motrin (Ibuprofen)   Penicillins   Sulfa Antibiotics   Zanaflex (Tizanidine Hcl)   Diphenhydramine Hcl           Current Medications (05/11/2022):  This  is the current hospital active medication list Current Facility-Administered Medications  Medication Dose Route Frequency Provider Last Rate Last Admin   0.9 %  sodium chloride infusion  250 mL Intravenous PRN Etta Quill, DO       acetaminophen (TYLENOL) tablet 650 mg  650 mg Oral Q4H PRN Etta Quill, DO       ALPRAZolam Duanne Moron) tablet 0.25 mg  0.25 mg Oral BID PRN Etta Quill, DO       aspirin chewable tablet 81 mg  81 mg Oral Daily Jennette Kettle M, DO   81 mg at 05/11/22 0955   atorvastatin (LIPITOR) tablet 40 mg  40 mg Oral QPM Jennette Kettle M, DO   40 mg at 05/10/22 1722   calcium carbonate (TUMS - dosed in mg elemental calcium) chewable tablet 400 mg of elemental calcium  2 tablet Oral BID PRN Damita Lack, MD   200 mg of elemental calcium at 05/07/22 1627   clopidogrel (PLAVIX) tablet 75 mg  75 mg Oral Daily Jennette Kettle M, DO   75 mg at 05/11/22 0955   docusate sodium (COLACE) capsule 100 mg  100 mg Oral BID Jennette Kettle M, DO   100 mg at 05/11/22 0955   enoxaparin (LOVENOX) injection 40 mg  40 mg Subcutaneous Q24H Jennette Kettle M, DO   40 mg at 05/11/22 4562   feeding supplement (BOOST / RESOURCE BREEZE) liquid 1 Container  1 Container Oral TID BM Amin, Jeanella Flattery, MD   1 Container at 05/11/22 1226   furosemide (LASIX) tablet 40 mg  40 mg Oral Daily Donato Heinz, MD   40 mg at 05/11/22 0955   guaiFENesin (ROBITUSSIN) 100 MG/5ML liquid 5 mL  5 mL Oral Q4H PRN Amin, Ankit Chirag, MD       hydrALAZINE (APRESOLINE) injection 10 mg  10 mg Intravenous Q4H PRN Amin, Ankit Chirag, MD       ipratropium-albuterol (DUONEB) 0.5-2.5 (3) MG/3ML nebulizer solution 3 mL  3 mL Nebulization Q4H PRN Amin, Ankit Chirag, MD       ipratropium-albuterol (DUONEB) 0.5-2.5 (3) MG/3ML nebulizer solution 3 mL  3 mL Inhalation TID Amin, Ankit Chirag, MD   3 mL at 05/11/22 0740   levETIRAcetam (KEPPRA) tablet 500 mg  500 mg Oral BID Amin, Ankit Chirag, MD   500 mg at 05/11/22  1224   losartan (COZAAR) tablet 25 mg  25 mg Oral Daily Donato Heinz, MD   25 mg at 05/11/22 0955   melatonin tablet 10 mg  10 mg Oral QHS Jennette Kettle M, DO   10 mg at 05/10/22 2017   metoprolol succinate (TOPROL-XL) 24 hr tablet 50 mg  50 mg Oral Daily Donato Heinz, MD   50 mg at 05/11/22 0954   metoprolol tartrate (LOPRESSOR) injection 5 mg  5 mg Intravenous Q4H PRN Amin, Ankit Chirag, MD       mirtazapine (REMERON) tablet 15 mg  15 mg Oral QHS Jennette Kettle M, DO   15 mg at 05/10/22 2017  ondansetron (ZOFRAN) injection 4 mg  4 mg Intravenous Q6H PRN Etta Quill, DO       pantoprazole (PROTONIX) EC tablet 40 mg  40 mg Oral Daily Jennette Kettle M, DO   40 mg at 05/11/22 0954   polyethylene glycol (MIRALAX / GLYCOLAX) packet 17 g  17 g Oral Daily PRN Etta Quill, DO       polyvinyl alcohol (LIQUIFILM TEARS) 1.4 % ophthalmic solution 1 drop  1 drop Both Eyes Daily Jennette Kettle M, DO   1 drop at 05/11/22 0956   sertraline (ZOLOFT) tablet 50 mg  50 mg Oral Daily Jennette Kettle M, DO   50 mg at 05/11/22 0955   sodium chloride flush (NS) 0.9 % injection 3 mL  3 mL Intravenous Q12H Jennette Kettle M, DO   3 mL at 05/11/22 1004   sodium chloride flush (NS) 0.9 % injection 3 mL  3 mL Intravenous PRN Etta Quill, DO       traMADol Veatrice Bourbon) tablet 50 mg  50 mg Oral Q12H PRN Etta Quill, DO   50 mg at 05/10/22 2022   traZODone (DESYREL) tablet 50 mg  50 mg Oral QHS PRN Amin, Jeanella Flattery, MD       zolpidem (AMBIEN) tablet 5 mg  5 mg Oral QHS PRN Etta Quill, DO   5 mg at 05/09/22 2056     Discharge Medications: Please see discharge summary for a list of discharge medications.  Relevant Imaging Results:  Relevant Lab Results:   Additional Information SSN [id number]; PFIZER Comrnaty(Gray TOP) Covid-19 Vaccine 05/31/2021  [place] COVID-19 Vaccine 09/21/2020 , 01/30/2020 , 01/09/2020  Reece Agar, LCSWA

## 2022-05-11 NOTE — Progress Notes (Signed)
Progress Note  Patient Name: Wendy [name]. [name] Date of Encounter: 05/11/2022  [facility] Cardiologist: None   Subjective   Underwent thoracentesis yesterday, 400 cc removed.  Creatinine stable at 0.9.  Reports feels fatigued but denies any dyspnea   Inpatient Medications    Scheduled Meds:  aspirin  81 mg Oral Daily   atorvastatin  40 mg Oral QPM   clopidogrel  75 mg Oral Daily   docusate sodium  100 mg Oral BID   enoxaparin (LOVENOX) injection  40 mg Subcutaneous Q24H   feeding supplement  1 Container Oral TID BM   furosemide  40 mg Oral Daily   ipratropium-albuterol  3 mL Inhalation TID   levETIRAcetam  500 mg Oral BID   losartan  25 mg Oral Daily   melatonin  10 mg Oral QHS   metoprolol succinate  50 mg Oral Daily   mirtazapine  15 mg Oral QHS   pantoprazole  40 mg Oral Daily   polyvinyl alcohol  1 drop Both Eyes Daily   sertraline  50 mg Oral Daily   sodium chloride flush  3 mL Intravenous Q12H   Continuous Infusions:  sodium chloride     PRN Meds: sodium chloride, acetaminophen, ALPRAZolam, calcium carbonate, guaiFENesin, hydrALAZINE, ipratropium-albuterol, metoprolol tartrate, ondansetron (ZOFRAN) IV, polyethylene glycol, sodium chloride flush, traMADol, traZODone, zolpidem   Vital Signs    Vitals:   05/10/22 2059 05/11/22 0401 05/11/22 0737 05/11/22 0740  BP:  (!) 104/55 (!) 149/65   Pulse:  87    Resp:  18 16   Temp:  98.5 F (36.9 C) 97.8 F (36.6 C)   TempSrc:  Oral Oral   SpO2: 95% 94% 94% 94%  Weight:  40.7 kg    Height:        Intake/Output Summary (Last 24 hours) at 05/11/2022 0943 Last data filed at 05/11/2022 0914 Gross per 24 hour  Intake 510 ml  Output 225 ml  Net 285 ml       05/11/2022    4:01 AM 05/10/2022    4:55 AM 05/09/2022    4:21 AM  Last 3 Weights  Weight (lbs) 89 lb 11.6 oz 93 lb 14.7 oz 95 lb 14.4 oz  Weight (kg) 40.7 kg 42.6 kg 43.5 kg      Telemetry    Normal sinus rhythm in 80s- Personally Reviewed  ECG     No new EKG- Personally Reviewed  Physical Exam   GEN: No acute distress.   Neck: No JVD Cardiac: RRR, no murmurs, rubs, or gallops.  Respiratory: CTAB GI: Soft, nontender, non-distended  MS: No edema; Neuro:  Nonfocal  Psych: Normal affect   Labs    High Sensitivity Troponin:   Recent Labs  Lab 05/06/22 1649 05/06/22 1838  TROPONINIHS 30* 31*      Chemistry Recent Labs  Lab 05/06/22 1649 05/06/22 1721 05/09/22 0124 05/10/22 0402 05/11/22 0319  NA 134*   < > 132* 134* 135  K 3.8   < > 4.2 3.6 4.5  CL 100   < > 95* 94* 97*  CO2 26   < > 28 33* 30  GLUCOSE 130*   < > 92 93 94  BUN 12   < > '14 11 14  ' CREATININE 0.70   < > 0.74 0.83 0.91  CALCIUM 8.4*   < > 8.0* 7.9* 8.1*  MG 1.8   < > 2.3 2.0 2.0  PROT 5.6*  --   --   --   --  ALBUMIN 2.7*  --   --   --   --   AST 28  --   --   --   --   ALT 21  --   --   --   --   ALKPHOS 286*  --   --   --   --   BILITOT 0.9  --   --   --   --   GFRNONAA >60   < > >60 >60 >60  ANIONGAP 8   < > '9 7 8   ' < > = values in this interval not displayed.     Lipids  Recent Labs  Lab 05/09/22 0124  CHOL 118  TRIG 101  HDL 33*  LDLCALC 65  CHOLHDL 3.6     Hematology Recent Labs  Lab 05/06/22 1649 05/06/22 1721  WBC 10.3  --   RBC 3.72*  --   HGB 11.2* 11.9*  HCT 34.3* 35.0*  MCV 92.2  --   MCH 30.1  --   MCHC 32.7  --   RDW 15.5  --   PLT 496*  --     Thyroid No results for input(s): TSH, FREET4 in the last 168 hours.  BNP Recent Labs  Lab 05/06/22 1635  BNP 2,404.2*     DDimer No results for input(s): DDIMER in the last 168 hours.   Radiology    DG Chest 1 View  Result Date: 05/10/2022 CLINICAL DATA:  Status post right thoracentesis EXAM: CHEST  1 VIEW COMPARISON:  05/06/2022 FINDINGS: Transverse diameter of heart is increased. Central pulmonary vessels are less prominent. There are no signs of alveolar pulmonary edema. There is interval decrease in right pleural effusion. Small bilateral pleural  effusions are seen, more so on the left side. There is no pneumothorax. There is evidence of previous vertebroplasty in multiple lower thoracic and upper lumbar vertebral bodies. IMPRESSION: There is interval decrease in pulmonary vascular congestion. There is interval decrease in right pleural effusion. There is no pneumothorax. Electronically Signed   By: Elmer Picker M.D.   On: 05/10/2022 12:19   MR CARDIAC MORPHOLOGY W WO CONTRAST  Result Date: 05/09/2022 CLINICAL DATA:  Amyloid evaluation EXAM: CARDIAC MRI TECHNIQUE: The patient was scanned on a 1.5 Tesla Siemens magnet. A dedicated cardiac coil was used. Functional imaging was done using Fiesta sequences. 2,3, and 4 chamber views were done to assess for RWMA's. Modified Simpson's rule using a short axis stack was used to calculate an ejection fraction on a dedicated work Conservation officer, nature. The patient received cc of Gadavist. After 10 minutes inversion recovery sequences were used to assess for infiltration and scar tissue. CONTRAST:  cc  of Gadavist FINDINGS: Left ventricle: -Normal size -Normal systolic function -Asymmetric hypertrophy measuring up to 20m in basal septum (761min posterior wall) -ECV significantly elevated (38%) -No LGE LV EF: 56% (Normal 56-78%) Absolute volumes: LV EDV: 7836mNormal 77-195 mL) LV ESV: 27m9mormal 19-72 mL) LV SV: 44mL29mrmal 51-133 mL) CO: 3.0L/min (Normal 2.8-8.8 L/min) Indexed volumes: LV EDV: 57mL/62m (Normal 47-92 mL/sq-m) LV ESV: 25mL/s77m(Normal 13-30 mL/sq-m) LV SV: 32mL/sq18mNormal 32-62 mL/sq-m) CI: 2.2L/min/sq-m (Normal 1.7-4.2 L/min/sq-m) Right ventricle: Normal size and systolic function RV EF:  55% (Normal 47-74%) Absolute volumes: RV EDV: 77mL (No67m 88-227 mL) RV ESV: 27mL (Nor27m23-103 mL) RV SV: 42mL (Norm65m2-138 mL) CO: 2.9L/min (Normal 2.8-8.8 L/min) Indexed volumes: RV EDV: 56mL/sq-m (56mal 55-105 mL/sq-m) RV ESV: 25mL/sq-m (N63ml 15-43 mL/sq-m)  RV SV: 90m/sq-m (Normal  32-64 mL/sq-m) CI: 2.1L/min/sq-m (Normal 1.7-4.2 L/min/sq-m) Left atrium: Mild enlargement Right atrium: Normal size Mitral valve: Trivial regurgitation Aortic valve: No regurgitation Tricuspid valve: Mild regurgitation Pulmonic valve: No regurgitation Aorta: Normal proximal ascending aorta Pericardium: Small effusion Extracardiac structures: Moderate right, small left pleural effusion IMPRESSION: 1. ECV significantly elevated (38%) with moderate asymmetric LV hypertrophy. While ECV is not quite in amyloid range (>40%) and there is no LGE, ECV elevation precedes development of LGE in cardiac amyloid. 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  Normal LV size and systolic function (516% 3. Normal RV size and systolic function (507% Electronically Signed   By: COswaldo MilianM.D.   On: 05/09/2022 23:07   IR THORACENTESIS ASP PLEURAL SPACE W/IMG GUIDE  Result Date: 05/10/2022 INDICATION: Patient with history of HTN, multiple myeloma, AL amyloidosis with complaint of progressive dyspnea found to be in CHF. Request for diagnostic and therapeutic right thoracentesis. EXAM: ULTRASOUND GUIDED DIAGNOSTIC AND THERAPEUTIC RIGHT THORACENTESIS MEDICATIONS: 10 mL 1 % lidocaine COMPLICATIONS: None immediate. PROCEDURE: An ultrasound guided thoracentesis was thoroughly discussed with the patient and questions answered. The benefits, risks, alternatives and complications were also discussed. The patient understands and wishes to proceed with the procedure. Written consent was obtained. Ultrasound was performed to localize and mark an adequate pocket of fluid in the right chest. The area was then prepped and draped in the normal sterile fashion. 1% Lidocaine was used for local anesthesia. Under ultrasound guidance a 6 Fr Safe-T-Centesis catheter was introduced. Thoracentesis was performed. The catheter was removed and a dressing applied. FINDINGS: A total of approximately 400 mL of clear, yellow fluid was  removed. Samples were sent to the laboratory as requested by the clinical team. IMPRESSION: Successful ultrasound guided right thoracentesis yielding 400 mL of pleural fluid. Read by: SNarda Rutherford AGNP-BC Electronically Signed   By: FMiachel RouxM.D.   On: 05/10/2022 12:56    Cardiac Studies   Echo 05/07/22:  1. Left ventricular ejection fraction, by estimation, is 55 to 60%. The  left ventricle has normal function. The left ventricle has no regional  wall motion abnormalities. There is mild concentric left ventricular  hypertrophy. Left ventricular diastolic  parameters are consistent with Grade II diastolic dysfunction  (pseudonormalization).   2. Right ventricular systolic function is normal. The right ventricular  size is normal. There is moderately elevated pulmonary artery systolic  pressure.   3. Left atrial size was mildly dilated.   4. A small pericardial effusion is present. The pericardial effusion is  circumferential.   5. The mitral valve is normal in structure. Mild mitral valve  regurgitation. No evidence of mitral stenosis.   6. The aortic valve is normal in structure. Aortic valve regurgitation is  trivial. Aortic valve sclerosis/calcification is present, without any  evidence of aortic stenosis.   7. The inferior vena cava is normal in size with greater than 50%  respiratory variability, suggesting right atrial pressure of 3 mmHg.   Patient Profile     76y.o. female with history of AL amyloidosis, CAD, hypertension, DVT, seizures, CVA who we are consulted for evaluation of suspected heart failure  Assessment & Plan    Acute on chronic diastolic heart failure: Presented with shortness of breath, initial SPO2 in 80s.  CTPA showed no PE, moderate bilateral pleural effusions.  BNP 2404.  Echo 5/15 shows EF 55 to 637% grade 2 diastolic dysfunction, normal RV function, no significant valvular disease. -Volume status and  symptoms appear improved.  Transitioned to p.o. Lasix  40 mg daily -Cardiac MRI with findings suggestive of early cardiac amyloidosis  Acute hypoxic respiratory failure: Required 4 L [place] on admission.  Likely due to heart failure as above.  Resolved, now on room air  Pleural effusion: remained moderate on R despite diuresis.  Status post thoracentesis with 400 cc removed 5/18  Troponin elevation: 30 > 31.  Not consistent with acute coronary syndrome.  Suspect due to demand ischemia in setting of decompensated heart failure as above.  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  Appears controlled  CVA: In 02/2022.  On aspirin, Plavix, statin  Hyperlipidemia: On atorvastatin 40 mg daily.  LDL 65  [facility] will sign off.   Medication Recommendations: Lasix 40 mg daily, Toprol-XL 50 mg daily losartan 25 mg daily (stopped lopressor and amlodipine) Other recommendations (labs, testing, etc): BMET within 1 week Follow up as an outpatient: Scheduled for 5/24    For questions or updates, please contact [place] Please consult [email or web address] for contact info under        Signed, Donato Heinz, MD  05/11/2022, 9:43 AM

## 2022-05-11 NOTE — Progress Notes (Addendum)
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    Recent admission in March for acute respiratory failure requiring intubation with incidental finding of new diastolic CHF (LVEF 48-01% on TTE, 50-55% on TEE) with mild LVH and moderate RV dysfunction.    She presented to ED from [facility] 05/14 complaining of progressive dyspnea with O2 sats in 80s on RA per EMS.  Patient reports that she only weighs herself weekly.  CTA negative for PE, evidence of stable bilateral pleural effusions and biatrial dilation.  CXR with vascular congestion.  Elevated BNP of 2404 along with JVD and 2+ edema on exam all consistent with CHF exacerbation.  Echo this admission with LVEF 55-60% slightly reduced from recent March hospitalization with new G2DD, moderately elevated pulmonary pressures and now normal RV function.  Started on IV diuresis.  Of note, patient is taking amlodipine 5 mg daily at home.   Good response to IV diuresis - dyspnea improving.  Cardiac MRI 05/17 with no LGE but elevated ECV 35%. Findings not indicative of amyloid (ECV > 40%, presence of LGE) but consistent with early cardiac amyloid.  Patient developed orthostasis 05/18 after addition of losartan 25 mg daily  - BP 106/62 sitting > 86/63 standing.  Morning BP meds and diuretic held.   Persistent moderate pleural effusion despite diuresis, underwent successful R thoracentesis 05/18 with yield of 400cc clear yellow fluid.   Current HF Medications: Diuretic: furosemide PO 40 mg daily Beta Blocker: metoprolol succinate 50 mg daily ACE/ARB/ARNI: losartan 25 mg daily  Prior to admission HF Medications: Beta blocker: metoprolol tartrate 25 mg twice daily  Pertinent Lab Values: Serum creatinine 0.91, BUN 11, Potassium 4.5, Sodium 135,  BNP 2402, Magnesium 2, A1c 6% (02/2022)  Vital Signs: Weight: 89 lbs; (admission weight: 100 lbs) Dry weight unclear - discharge weight 111lb in March Blood pressure: 100-20s/50-60s Heart rate: 80-90s  I/O: -275 cc yesterday; net -1.65L  Medication Assistance / Insurance Benefits Check: Does the patient have prescription insurance?  Yes Type of insurance plan: UHC Medicare - Delene Loll and [place] copays >$100 due to donut hole  Outpatient Pharmacy:  Prior to admission [facility] Is the patient willing to use [place] at discharge? Yes Is the patient willing to transition their outpatient pharmacy to utilize a [facility] outpatient pharmacy?   Pending    Assessment: 1. Acute on chronic diastolic CHF (LVEF 65-53%). NYHA class IV symptoms. - Follow plans for cardiac MRI when euvolemic  - Continue diuresis with furosemide PO 40 mg daily - dyspnea significantly improved - Continue metoprolol succinate 50 mg daily - Continue losartan 25 mg daily. Will hold off on switching to [facility] with new orthostasis. - No MRA/SGLTi with labile BP with diuresis   Plan: 1) Medication changes recommended at this time: - Consider switch to Entresto 24-26 mg twice daily if BP improves - Eventually start SGLT2i and spironolactone as BP permits. Can consider at 05/26 [facility] appointment.  2) Patient assistance: - Patient declined saying that she would not qualify financially for assistance.  3)  Education  - Patient has been educated on current HF medications and potential additions to HF medication regimen - Patient verbalizes understanding that over the next few months, these medication doses may change and more medications may be  added to optimize HF regimen - Patient has been educated on basic disease state pathophysiology and goals of therapy - Reinforce importance of daily weights and fluid restriction - Stop PTA amlodipine and metoprolol tartrate  Laurey Arrow, PharmD PGY1  Pharmacy Resident 05/11/2022  1:17 PM

## 2022-05-11 NOTE — Discharge Summary (Signed)
Physician Discharge Summary  Coram [name] YKD:[id number] DOB: [birth year] DOA: 05/06/2022  PCP: Abner Greenspan, MD  Admit date: 05/06/2022 Discharge date: 05/11/2022  Admitted From: ALF Disposition:  ALF  Recommendations for Outpatient Follow-up:  Follow up with PCP in 1-2 weeks Please obtain BMP/CBC in one week your next doctors visit.  Lopressor and amlodipine stopped Lasix 40 mg daily, Toprol-XL 50 mg daily, losartan 25 mg orally daily prescribed Follow-up outpatient hematology/oncology   Discharge Condition: Stable CODE STATUS: Full code Diet recommendation:   Brief/Interim Summary: Heart healthy 76 year old with history of HTN, multiple myeloma, AL amyloidosis seeing outpatient oncology undergoing immunotherapy comes to the hospital with progressive dyspnea on exertion found to be in CHF.  Patient was admitted recently to the hospital for similar reason complicated by multifocal CVA.  Patient was started on IV diuretics with plans to get cardiac MRI on 5/17.  Cardiac MRI showed evidence of early cardiac amyloid.  Medications were adjusted by cardiology team as mentioned above. Patient is medically stable for discharge today.     Assessment & Plan:  Principal Problem:   Acute on chronic heart failure with preserved ejection fraction (HFpEF) (HCC) Active Problems:   Acute respiratory failure (HCC)   Light chain (AL) amyloidosis (HCC)   Essential hypertension   Multiple myeloma (HCC)   Acute heart failure (HCC)   Pleural effusion       Assessment and Plan: Acute on chronic heart failure with preserved ejection fraction (HFpEF) (HCC) Echocardiogram March 2023-EF 50%, PAH Pt with known light chain amyloidosis disease. -Cardiology following: cardiac MRI- early amyloid - Home meds-aspirin/Plavix, normal Toprol-XL   Acute respiratory failure (HCC) Moderate bilateral pleural effusion Stable bilateral pulmonary nodule -Secondary to CHF exacerbation - CTA chest negative for  PE Status post thoracentesis by IR on 5/18.  Breathing is significantly improved.   Light chain (AL) amyloidosis (HCC) -Follows with outpatient oncology team.  Cardiac MRI which is showing cardiac amyloid.  Follow-up outpatient   Essential hypertension -Home Norvasc and Lopressor discontinued.  Now on Toprol-XL, Lasix and losartan.   Multiple myeloma (HCC) -Pathologic fracture of manubrium.  Follows outpatient oncology   History of CVA - CVA in March.  Currently on aspirin Plavix and statin   History of seizure - On Keppra 500 mg twice daily   Severe protein calorie malnutrition - Dietitian consult     Discharge Diagnoses:  Principal Problem:   Acute on chronic heart failure with preserved ejection fraction (HFpEF) (HCC) Active Problems:   Acute respiratory failure (HCC)   Light chain (AL) amyloidosis (HCC)   Essential hypertension   Multiple myeloma ([place])   Acute heart failure ([place])   Pleural effusion      Consultations: Cardiology  Subjective: Feels great no complaints.  Discharge Exam: Vitals:   05/11/22 0954 05/11/22 1117  BP: 110/73 132/65  Pulse: 95 90  Resp:  16  Temp:  98.8 F (37.1 C)  SpO2:  97%   Vitals:   05/11/22 0737 05/11/22 0740 05/11/22 0954 05/11/22 1117  BP: (!) 149/65  110/73 132/65  Pulse:   95 90  Resp: 16   16  Temp: 97.8 F (36.6 C)   98.8 F (37.1 C)  TempSrc: Oral   Oral  SpO2: 94% 94%  97%  Weight:      Height:        General: Pt is alert, awake, not in acute distress Cardiovascular: RRR, S1/S2 +, no rubs, no gallops Respiratory: CTA bilaterally, no wheezing, no  rhonchi Abdominal: Soft, NT, ND, bowel sounds + Extremities: no edema, no cyanosis  Discharge Instructions   Allergies as of 05/11/2022       Reactions   Bentyl [dicyclomine Hcl] Other (See Comments)   Groggy, blurred vision   Butalbital-aspirin-caffeine Other (See Comments)   hallucinations   Clonidine Derivatives Other (See Comments)   dizziness,  lightheadedness, abdominal cramping, dry mouth/throat   Linzess [linaclotide] Diarrhea   Morphine And Related Other (See Comments)   Does not work   Motrin [ibuprofen] Other (See Comments)   GI upset   Penicillins Other (See Comments)   As child; reaction unknown Has patient had a PCN reaction causing immediate rash, facial/tongue/throat swelling, SOB or lightheadedness with hypotension: No Has patient had a PCN reaction causing severe rash involving mucus membranes or skin necrosis: No Has patient had a PCN reaction that required hospitalization: No Has patient had a PCN reaction occurring within the last 10 years: No If all of the above answers are "NO", then may proceed with Cephalosporin use.   Sulfa Antibiotics Other (See Comments)   In childhood   Zanaflex [tizanidine Hcl] Other (See Comments)   Decreased BP   Diphenhydramine Hcl Palpitations   restlessness        Medication List     STOP taking these medications    amLODipine 5 MG tablet Commonly known as: NORVASC   metoprolol tartrate 25 MG tablet Commonly known as: LOPRESSOR       TAKE these medications    acetaminophen 325 MG tablet Commonly known as: TYLENOL Take 2 tablets (650 mg total) by mouth every 6 (six) hours as needed for mild pain, fever or headache.   albuterol 108 (90 Base) MCG/ACT inhaler Commonly known as: VENTOLIN HFA Inhale 2 puffs by mouth into the lungs every 6 hours as needed for wheezing or shortness of breath   alclomethasone 0.05 % ointment Commonly known as: ACLOVATE apply topically to the lips once daily as needed What changed: See the new instructions.   ALPRAZolam 0.25 MG tablet Commonly known as: XANAX Take 0.25 mg by mouth 2 (two) times daily as needed for anxiety.   ascorbic acid 500 MG tablet Commonly known as: VITAMIN C Take 1 tablet (500 mg total) by mouth 2 (two) times daily.   aspirin 81 MG chewable tablet Chew 1 tablet (81 mg total) by mouth daily.    atorvastatin 40 MG tablet Commonly known as: LIPITOR Take 1 tablet (40 mg total) by mouth daily. What changed: when to take this   clopidogrel 75 MG tablet Commonly known as: PLAVIX Take 1 tablet (75 mg total) by mouth daily.   cyanocobalamin 1000 MCG tablet Take 1 tablet (1,000 mcg total) by mouth daily.   diphenoxylate-atropine 2.5-0.025 MG tablet Commonly known as: LOMOTIL Take 1 tablet by mouth 4 (four) times daily as needed for diarrhea or loose stools.   docusate sodium 100 MG capsule Commonly known as: COLACE Take 1 capsule (100 mg total) by mouth 2 (two) times daily.   furosemide 40 MG tablet Commonly known as: LASIX Take 1 tablet (40 mg total) by mouth daily. Start taking on: May 12, 2022   levETIRAcetam 100 MG/ML solution Commonly known as: KEPPRA Take 5 mLs (500 mg total) by mouth 2 (two) times daily.   losartan 25 MG tablet Commonly known as: COZAAR Take 1 tablet (25 mg total) by mouth daily. Start taking on: May 12, 2022   Lubricant Eye Drops 0.4-0.3 % Soln Generic drug: Polyethyl  Glycol-Propyl Glycol Place 1 drop into both eyes daily.   Melatonin 10 MG Tabs Take 10 mg by mouth at bedtime.   metoprolol succinate 50 MG 24 hr tablet Commonly known as: TOPROL-XL Take 1 tablet (50 mg total) by mouth daily. Take with or immediately following a meal. Start taking on: May 12, 2022   mirtazapine 15 MG tablet Commonly known as: REMERON Take 15 mg by mouth at bedtime.   neomycin-bacitracin-polymyxin [phone] ointment Apply 1 application. topically See admin instructions. Cleanse right leg with soap and water, pat dry, apply TAO,cover with non adherent dressing qd until healed   pantoprazole sodium 40 mg Commonly known as: PROTONIX Take 40 mg by mouth daily.   polyethylene glycol powder 17 GM/SCOOP powder Commonly known as: GLYCOLAX/MIRALAX Take 17 g by mouth daily as needed for mild constipation.   sertraline 50 MG tablet Commonly known as:  ZOLOFT Take 50 mg by mouth daily.   traMADol 50 MG tablet Commonly known as: ULTRAM Take 1 tablet (50 mg total) by mouth every 12 (twelve) hours as needed for severe pain.   TUMS PO Take 2 tablets by mouth 4 (four) times daily as needed (heartburn/indigestion).   Vitamin D 50 MCG (2000 UT) tablet Take 2,000 Units by mouth daily.   zinc sulfate 220 (50 Zn) MG capsule Take 1 capsule (220 mg total) by mouth daily.   zolpidem 10 MG tablet Commonly known as: AMBIEN Take 1 tablet (10 mg total) by mouth at bedtime as needed. for sleep        Follow-up Information     [facility]. Go in 8 day(s).   Specialty: Cardiology Why: Hospital Follow up PLEASE bring a current medication list FREE valet parking, Entrance C, off of [employer] information: [address] [id number] [place] [id number] [phone]        Abner Greenspan, MD. Go on 05/22/2022.   Specialties: Family Medicine, Radiology Why: '@11' :30am Contact information: [place] [address] [phone]         Nahser, Wonda Cheng, MD Follow up.   Specialty: Cardiology Why: Hospital follow-up scheduled for 06/11/2022 at 9:40am. Please arrive 15 minutes early for check-in. If this date/time does not work for you, please call our office to reschedule. Contact information: [address] [place] [address] [phone]                Allergies  Allergen Reactions   Bentyl [Dicyclomine Hcl] Other (See Comments)    Groggy, blurred vision   Butalbital-Aspirin-Caffeine Other (See Comments)    hallucinations   Clonidine Derivatives Other (See Comments)    dizziness, lightheadedness, abdominal cramping, dry mouth/throat   Linzess [Linaclotide] Diarrhea   Morphine And Related Other (See Comments)    Does not work   Motrin [Ibuprofen] Other (See Comments)    GI upset   Penicillins Other (See  Comments)    As child; reaction unknown Has patient had a PCN reaction causing immediate rash, facial/tongue/throat swelling, SOB or lightheadedness with hypotension: No Has patient had a PCN reaction causing severe rash involving mucus membranes or skin necrosis: No Has patient had a PCN reaction that required hospitalization: No Has patient had a PCN reaction occurring within the last 10 years: No If all of the above answers are "NO", then may proceed with Cephalosporin use.   Sulfa Antibiotics Other (See Comments)    In childhood  Zanaflex [Tizanidine Hcl] Other (See Comments)    Decreased BP   Diphenhydramine Hcl Palpitations    restlessness    You were cared for by a hospitalist during your hospital stay. If you have any questions about your discharge medications or the care you received while you were in the hospital after you are discharged, you can call the unit and asked to speak with the hospitalist on call if the hospitalist that took care of you is not available. Once you are discharged, your primary care physician will handle any further medical issues. Please note that no refills for any discharge medications will be authorized once you are discharged, as it is imperative that you return to your primary care physician (or establish a relationship with a primary care physician if you do not have one) for your aftercare needs so that they can reassess your need for medications and monitor your lab values.   Procedures/Studies: DG Chest 1 View  Result Date: 05/10/2022 CLINICAL DATA:  Status post right thoracentesis EXAM: CHEST  1 VIEW COMPARISON:  05/06/2022 FINDINGS: Transverse diameter of heart is increased. Central pulmonary vessels are less prominent. There are no signs of alveolar pulmonary edema. There is interval decrease in right pleural effusion. Small bilateral pleural effusions are seen, more so on the left side. There is no pneumothorax. There is evidence of previous  vertebroplasty in multiple lower thoracic and upper lumbar vertebral bodies. IMPRESSION: There is interval decrease in pulmonary vascular congestion. There is interval decrease in right pleural effusion. There is no pneumothorax. Electronically Signed   By: Elmer Picker M.D.   On: 05/10/2022 12:19   CT Angio Chest PE W and/or Wo Contrast  Result Date: 05/06/2022 CLINICAL DATA:  Short of breath, cough, hypoxia EXAM: CT ANGIOGRAPHY CHEST WITH CONTRAST TECHNIQUE: Multidetector CT imaging of the chest was performed using the standard protocol during bolus administration of intravenous contrast. Multiplanar CT image reconstructions and MIPs were obtained to evaluate the vascular anatomy. RADIATION DOSE REDUCTION: This exam was performed according to the departmental dose-optimization program which includes automated exposure control, adjustment of the mA and/or kV according to patient size and/or use of iterative reconstruction technique. CONTRAST:  24m OMNIPAQUE IOHEXOL 350 MG/ML SOLN COMPARISON:  05/06/2022, 03/11/2022 FINDINGS: Cardiovascular: This is a technically adequate evaluation of the pulmonary vasculature. No filling defects or pulmonary emboli. The heart is not enlarged. No pericardial effusion. Continued biatrial dilation. Normal caliber of the thoracic aorta. Stable atherosclerosis of the aorta and coronary vasculature. Mediastinum/Nodes: No enlarged mediastinal, hilar, or axillary lymph nodes. Thyroid gland, trachea, and esophagus demonstrate no significant findings. Lungs/Pleura: Moderate bilateral pleural effusions, right greater than left, not appreciably changed. Dependent lower lobe atelectasis is again noted. No acute airspace disease or pneumothorax. Innumerable sub 3 mm bilateral pulmonary nodules are again seen, likely post infectious or post inflammatory and benign given long-term stability. Central airways are patent. Upper Abdomen: Diffuse mesenteric and retroperitoneal edema. No  acute findings. Musculoskeletal: Patient is cachectic. There is diffuse subcutaneous edema consistent with anasarca. Stable vertebra plana at T10. Chronic compression deformities at T5, T12, L1, L2, and L3 are again noted with evidence of vertebral augmentation. Mottled lucency of the manubrium with stable fracture likely sequela of known multiple myeloma. No new fractures. Reconstructed images demonstrate no additional findings. Review of the MIP images confirms the above findings. IMPRESSION: 1. No evidence of pulmonary embolus. 2. Stable moderate bilateral pleural effusions and dependent lower lobe atelectasis, right greater than left. 3. Stable biatrial  dilatation. 4. Pathologic manubrial fracture consistent with underlying multiple myeloma. Multiple thoracolumbar compression deformities and vertebral augmentation as above. 5. Innumerable sub 3 mm bilateral pulmonary nodules, likely sequela of prior infection or inflammation and benign given long-term stability. 6. Aortic Atherosclerosis (ICD10-I70.0). Coronary artery atherosclerosis. Electronically Signed   By: Randa Ngo M.D.   On: 05/06/2022 20:07   DG Chest Port 1 View  Result Date: 05/06/2022 CLINICAL DATA:  Short of breath, hypoxia EXAM: PORTABLE CHEST 1 VIEW COMPARISON:  03/14/2022 FINDINGS: Single frontal view of the chest demonstrates a stable cardiac silhouette. Chronic central vascular congestion. Bibasilar veiling opacities are unchanged, consistent with effusions and/or basilar consolidation. No pneumothorax. No acute bony abnormalities. IMPRESSION: 1. Bibasilar veiling opacities consistent with effusions and/or lower lobe consolidation. 2. Stable central vascular congestion. Electronically Signed   By: Randa Ngo M.D.   On: 05/06/2022 17:22   MR CARDIAC MORPHOLOGY W WO CONTRAST  Result Date: 05/09/2022 CLINICAL DATA:  Amyloid evaluation EXAM: CARDIAC MRI TECHNIQUE: The patient was scanned on a 1.5 Tesla Siemens magnet. A dedicated  cardiac coil was used. Functional imaging was done using [employer] sequences. 2,3, and 4 chamber views were done to assess for RWMA's. Modified Simpson's rule using a short axis stack was used to calculate an ejection fraction on a dedicated work Conservation officer, nature. The patient received cc of Gadavist. After 10 minutes inversion recovery sequences were used to assess for infiltration and scar tissue. CONTRAST:  cc  of Gadavist FINDINGS: Left ventricle: -Normal size -Normal systolic function -Asymmetric hypertrophy measuring up to 1m in basal septum (765min posterior wall) -ECV significantly elevated (38%) -No LGE LV EF: 56% (Normal 56-78%) Absolute volumes: LV EDV: 7878mNormal 77-195 mL) LV ESV: 92m7mormal 19-72 mL) LV SV: 44mL69mrmal 51-133 mL) CO: 3.0L/min (Normal 2.8-8.8 L/min) Indexed volumes: LV EDV: 57mL/11m (Normal 47-92 mL/sq-m) LV ESV: 25mL/s37m(Normal 13-30 mL/sq-m) LV SV: 32mL/sq2mNormal 32-62 mL/sq-m) CI: 2.2L/min/sq-m (Normal 1.7-4.2 L/min/sq-m) Right ventricle: Normal size and systolic function RV EF:  55% (Normal 47-74%) Absolute volumes: RV EDV: 77mL (No29m 88-227 mL) RV ESV: 92mL (Nor30m23-103 mL) RV SV: 42mL (Norm64m2-138 mL) CO: 2.9L/min (Normal 2.8-8.8 L/min) Indexed volumes: RV EDV: 56mL/sq-m (67mal 55-105 mL/sq-m) RV ESV: 25mL/sq-m (N16ml 15-43 mL/sq-m) RV SV: 31mL/sq-m (No97m 32-64 mL/sq-m) CI: 2.1L/min/sq-m (Normal 1.7-4.2 L/min/sq-m) Left atrium: Mild enlargement Right atrium: Normal size Mitral valve: Trivial regurgitation Aortic valve: No regurgitation Tricuspid valve: Mild regurgitation Pulmonic valve: No regurgitation Aorta: Normal proximal ascending aorta Pericardium: Small effusion Extracardiac structures: Moderate right, small left pleural effusion IMPRESSION: 1. ECV significantly elevated (38%) with moderate asymmetric LV hypertrophy. While ECV is not quite in amyloid range (>40%) and there is no LGE, ECV elevation precedes development of LGE in cardiac  amyloid. 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  Normal LV size and systolic function (56%) 3. Normal03% size and systolic function (55%) Electroni50%ly Signed   By: Christopher  SOswaldo Milian/17/2023 23:07   ECHOCARDIOGRAM LIMITED  Result Date: 05/07/2022    ECHOCARDIOGRAM LIMITED REPORT   Patient Name:   Wendy [name] Date of Exam: 05/07/2022 Medical Rec #:  [id number]     [id number]     62.0 in Accession #:    [id number]    [id number]    111.1 lb Date of Birth:  10/11/44     09/08/1946     1.489 m Patient Age:  75 years      BP:           117/67 mmHg Patient Gender: F             HR:           84 bpm. Exam Location:  Inpatient Procedure: 2D Echo, Limited Echo, Cardiac Doppler and Color Doppler Indications:    CHF  History:        Patient has prior history of Echocardiogram examinations. CAD;                 Risk Factors:Hypertension.  Sonographer:    Jyl Heinz Referring Phys: [id number] [place]  1. Left ventricular ejection fraction, by estimation, is 55 to 60%. The left ventricle has normal function. The left ventricle has no regional wall motion abnormalities. There is mild concentric left ventricular hypertrophy. Left ventricular diastolic parameters are consistent with Grade II diastolic dysfunction (pseudonormalization).  2. Right ventricular systolic function is normal. The right ventricular size is normal. There is moderately elevated pulmonary artery systolic pressure.  3. Left atrial size was mildly dilated.  4. A small pericardial effusion is present. The pericardial effusion is circumferential.  5. The mitral valve is normal in structure. Mild mitral valve regurgitation. No evidence of mitral stenosis.  6. The aortic valve is normal in structure. Aortic valve regurgitation is trivial. Aortic valve sclerosis/calcification is present, without any evidence of aortic stenosis.  7. The inferior vena cava is normal in size with greater than 50%  respiratory variability, suggesting right atrial pressure of 3 mmHg. FINDINGS  Left Ventricle: Left ventricular ejection fraction, by estimation, is 55 to 60%. The left ventricle has normal function. The left ventricle has no regional wall motion abnormalities. The left ventricular internal cavity size was normal in size. There is  mild concentric left ventricular hypertrophy. Left ventricular diastolic parameters are consistent with Grade II diastolic dysfunction (pseudonormalization). Right Ventricle: The right ventricular size is normal. No increase in right ventricular wall thickness. Right ventricular systolic function is normal. There is moderately elevated pulmonary artery systolic pressure. The tricuspid regurgitant velocity is 3.45 m/s, and with an assumed right atrial pressure of 3 mmHg, the estimated right ventricular systolic pressure is 37.1 mmHg. Left Atrium: Left atrial size was mildly dilated. Right Atrium: Right atrial size was normal in size. Pericardium: A small pericardial effusion is present. The pericardial effusion is circumferential. Mitral Valve: The mitral valve is normal in structure. Mild mitral valve regurgitation. No evidence of mitral valve stenosis. Tricuspid Valve: The tricuspid valve is normal in structure. Tricuspid valve regurgitation is not demonstrated. No evidence of tricuspid stenosis. Aortic Valve: The aortic valve is normal in structure. Aortic valve regurgitation is trivial. Aortic valve sclerosis/calcification is present, without any evidence of aortic stenosis. Aortic valve peak gradient measures 9.0 mmHg. Pulmonic Valve: The pulmonic valve was normal in structure. Pulmonic valve regurgitation is trivial. No evidence of pulmonic stenosis. Aorta: The aortic root is normal in size and structure. Venous: The inferior vena cava is normal in size with greater than 50% respiratory variability, suggesting right atrial pressure of 3 mmHg. IAS/Shunts: No atrial level shunt  detected by color flow Doppler. LEFT VENTRICLE PLAX 2D LVIDd:         3.40 cm     Diastology LVIDs:         2.40 cm     LV e' medial:    4.03 cm/s LV PW:  0.90 cm     LV E/e' medial:  19.7 LV IVS:        1.10 cm     LV e' lateral:   5.77 cm/s LVOT diam:     1.80 cm     LV E/e' lateral: 13.8 LV SV:         47 LV SV Index:   32 LVOT Area:     2.54 cm  LV Volumes (MOD) LV vol d, MOD A2C: 74.7 ml LV vol d, MOD A4C: 59.9 ml LV vol s, MOD A2C: 27.5 ml LV vol s, MOD A4C: 22.0 ml LV SV MOD A2C:     47.2 ml LV SV MOD A4C:     59.9 ml LV SV MOD BP:      43.9 ml RIGHT VENTRICLE            IVC RV S prime:     9.79 cm/s  IVC diam: 1.40 cm TAPSE (M-mode): 1.5 cm LEFT ATRIUM         Index LA diam:    3.70 cm 2.48 cm/m  AORTIC VALVE AV Area (Vmax): 1.63 cm AV Vmax:        150.00 cm/s AV Peak Grad:   9.0 mmHg LVOT Vmax:      96.30 cm/s LVOT Vmean:     70.200 cm/s LVOT VTI:       0.185 m  AORTA Ao Root diam: 2.60 cm Ao Asc diam:  2.70 cm MITRAL VALVE               TRICUSPID VALVE MV Area (PHT): 4.57 cm    TR Peak grad:   47.6 mmHg MV Decel Time: 166 msec    TR Vmax:        345.00 cm/s MR Peak grad: 67.9 mmHg MR Vmax:      412.00 cm/s  SHUNTS MV E velocity: 79.50 cm/s  Systemic VTI:  0.18 m MV A velocity: 44.60 cm/s  Systemic Diam: 1.80 cm MV E/A ratio:  1.78 Kardie Tobb DO Electronically signed by Berniece Salines DO Signature Date/Time: 05/07/2022/5:07:17 PM    Final    IR THORACENTESIS ASP PLEURAL SPACE W/IMG GUIDE  Result Date: 05/10/2022 INDICATION: Patient with history of HTN, multiple myeloma, AL amyloidosis with complaint of progressive dyspnea found to be in CHF. Request for diagnostic and therapeutic right thoracentesis. EXAM: ULTRASOUND GUIDED DIAGNOSTIC AND THERAPEUTIC RIGHT THORACENTESIS MEDICATIONS: 10 mL 1 % lidocaine COMPLICATIONS: None immediate. PROCEDURE: An ultrasound guided thoracentesis was thoroughly discussed with the patient and questions answered. The benefits, risks, alternatives and complications  were also discussed. The patient understands and wishes to proceed with the procedure. Written consent was obtained. Ultrasound was performed to localize and mark an adequate pocket of fluid in the right chest. The area was then prepped and draped in the normal sterile fashion. 1% Lidocaine was used for local anesthesia. Under ultrasound guidance a 6 Fr Safe-T-Centesis catheter was introduced. Thoracentesis was performed. The catheter was removed and a dressing applied. FINDINGS: A total of approximately 400 mL of clear, yellow fluid was removed. Samples were sent to the laboratory as requested by the clinical team. IMPRESSION: Successful ultrasound guided right thoracentesis yielding 400 mL of pleural fluid. Read by: Narda Rutherford, AGNP-BC Electronically Signed   By: Miachel Roux M.D.   On: 05/10/2022 12:56     The results of significant diagnostics from this hospitalization (including imaging, microbiology, ancillary and laboratory) are listed below for reference.  Microbiology: Recent Results (from the past 240 hour(s))  Resp Panel by RT-PCR (Flu A&B, Covid) Nasopharyngeal Swab     Status: None   Collection Time: 05/06/22  4:37 PM   Specimen: Nasopharyngeal Swab; Nasopharyngeal(NP) swabs in vial transport medium  Result Value Ref Range Status   SARS Coronavirus 2 by RT PCR NEGATIVE NEGATIVE Final    Comment: (NOTE) SARS-CoV-2 target nucleic acids are NOT DETECTED.  The SARS-CoV-2 RNA is generally detectable in upper respiratory specimens during the acute phase of infection. The lowest concentration of SARS-CoV-2 viral copies this assay can detect is 138 copies/mL. A negative result does not preclude SARS-Cov-2 infection and should not be used as the sole basis for treatment or other patient management decisions. A negative result may occur with  improper specimen collection/handling, submission of specimen other than nasopharyngeal swab, presence of viral mutation(s) within the areas  targeted by this assay, and inadequate number of viral copies(<138 copies/mL). A negative result must be combined with clinical observations, patient history, and epidemiological information. The expected result is Negative.  Fact Sheet for Patients:  EntrepreneurPulse.com.au  Fact Sheet for Healthcare Providers:  IncredibleEmployment.be  This test is no t yet approved or cleared by the [place] FDA and  has been authorized for detection and/or diagnosis of SARS-CoV-2 by FDA under an Emergency Use Authorization (EUA). This EUA will remain  in effect (meaning this test can be used) for the duration of the COVID-19 declaration under Section 564(b)(1) of the Act, 21 U.S.C.section 360bbb-3(b)(1), unless the authorization is terminated  or revoked sooner.       Influenza A by PCR NEGATIVE NEGATIVE Final   Influenza B by PCR NEGATIVE NEGATIVE Final    Comment: (NOTE) The Xpert Xpress SARS-CoV-2/FLU/RSV plus assay is intended as an aid in the diagnosis of influenza from Nasopharyngeal swab specimens and should not be used as a sole basis for treatment. Nasal washings and aspirates are unacceptable for Xpert Xpress SARS-CoV-2/FLU/RSV testing.  Fact Sheet for Patients: EntrepreneurPulse.com.au  Fact Sheet for Healthcare Providers: IncredibleEmployment.be  This test is not yet approved or cleared by the [place] FDA and has been authorized for detection and/or diagnosis of SARS-CoV-2 by FDA under an Emergency Use Authorization (EUA). This EUA will remain in effect (meaning this test can be used) for the duration of the COVID-19 declaration under Section 564(b)(1) of the Act, 21 U.S.C. section 360bbb-3(b)(1), unless the authorization is terminated or revoked.  Performed at [facility], [place] [address], [place], [place] [address]   Culture, body fluid w Gram Stain-bottle     Status: None  (Preliminary result)   Collection Time: 05/10/22 12:58 PM   Specimen: Pleura  Result Value Ref Range Status   Specimen Description PLEURAL  Final   Special Requests NONE  Final   Culture   Final    NO GROWTH < 24 HOURS Performed at [facility], [place] [address] [place], [place] [address]    Report Status PENDING  Incomplete  Gram stain     Status: None   Collection Time: 05/10/22 12:58 PM   Specimen: Pleura  Result Value Ref Range Status   Specimen Description PLEURAL  Final   Special Requests NONE  Final   Gram Stain   Final    NO WBC SEEN NO ORGANISMS SEEN Performed at [facility], [address], [place], [place] [address]    Report Status 05/10/2022 FINAL  Final     Labs: BNP (  last 3 results) Recent Labs    03/10/22 2236 03/12/22 0533 05/06/22 1635  BNP 1,562.0* 721.7* 6,803.2*   Basic Metabolic Panel: Recent Labs  Lab 05/06/22 1649 05/06/22 1721 05/07/22 0415 05/08/22 0459 05/09/22 0124 05/10/22 0402 05/11/22 0319  NA 134*   < > 135 133* 132* 134* 135  K 3.8   < > 4.4 3.6 4.2 3.6 4.5  CL 100  --  100 94* 95* 94* 97*  CO2 26  --  '25 29 28 ' 33* 30  GLUCOSE 130*  --  170* 97 92 93 94  BUN 12  --  '12 13 14 11 14  ' CREATININE 0.70  --  0.78 0.77 0.74 0.83 0.91  CALCIUM 8.4*  --  8.2* 7.9* 8.0* 7.9* 8.1*  MG 1.8  --   --  1.7 2.3 2.0 2.0   < > = values in this interval not displayed.   Liver Function Tests: Recent Labs  Lab 05/06/22 1649  AST 28  ALT 21  ALKPHOS 286*  BILITOT 0.9  PROT 5.6*  ALBUMIN 2.7*   No results for input(s): LIPASE, AMYLASE in the last 168 hours. No results for input(s): AMMONIA in the last 168 hours. CBC: Recent Labs  Lab 05/06/22 1649 05/06/22 1721  WBC 10.3  --   NEUTROABS 8.4*  --   HGB 11.2* 11.9*  HCT 34.3* 35.0*  MCV 92.2  --   PLT 496*  --    Cardiac Enzymes: No results for input(s): CKTOTAL, CKMB, CKMBINDEX, TROPONINI in the last 168 hours. BNP: Invalid input(s): POCBNP CBG: No results for  input(s): GLUCAP in the last 168 hours. D-Dimer No results for input(s): DDIMER in the last 72 hours. Hgb A1c No results for input(s): HGBA1C in the last 72 hours. Lipid Profile Recent Labs    05/09/22 0124  CHOL 118  HDL 33*  LDLCALC 65  TRIG 101  CHOLHDL 3.6   Thyroid function studies No results for input(s): TSH, T4TOTAL, T3FREE, THYROIDAB in the last 72 hours.  Invalid input(s): FREET3 Anemia work up No results for input(s): VITAMINB12, FOLATE, FERRITIN, TIBC, IRON, RETICCTPCT in the last 72 hours. Urinalysis    Component Value Date/Time   COLORURINE COLORLESS (A) 03/11/2022 0300   APPEARANCEUR CLEAR (A) 03/11/2022 0300   LABSPEC 1.013 03/11/2022 0300   PHURINE 7.0 03/11/2022 0300   GLUCOSEU NEGATIVE 03/11/2022 0300   HGBUR SMALL (A) 03/11/2022 0300   BILIRUBINUR NEGATIVE 03/11/2022 0300   BILIRUBINUR negative 02/20/2022 1555   BILIRUBINUR negative 11/09/2021 1009   KETONESUR NEGATIVE 03/11/2022 0300   PROTEINUR 100 (A) 03/11/2022 0300   UROBILINOGEN 0.2 02/20/2022 1555   NITRITE NEGATIVE 03/11/2022 0300   LEUKOCYTESUR NEGATIVE 03/11/2022 0300   Sepsis Labs Invalid input(s): PROCALCITONIN,  WBC,  LACTICIDVEN Microbiology Recent Results (from the past 240 hour(s))  Resp Panel by RT-PCR (Flu A&B, Covid) Nasopharyngeal Swab     Status: None   Collection Time: 05/06/22  4:37 PM   Specimen: Nasopharyngeal Swab; Nasopharyngeal(NP) swabs in vial transport medium  Result Value Ref Range Status   SARS Coronavirus 2 by RT PCR NEGATIVE NEGATIVE Final    Comment: (NOTE) SARS-CoV-2 target nucleic acids are NOT DETECTED.  The SARS-CoV-2 RNA is generally detectable in upper respiratory specimens during the acute phase of infection. The lowest concentration of SARS-CoV-2 viral copies this assay can detect is 138 copies/mL. A negative result does not preclude SARS-Cov-2 infection and should not be used as the sole basis for treatment or other patient  management decisions. A  negative result may occur with  improper specimen collection/handling, submission of specimen other than nasopharyngeal swab, presence of viral mutation(s) within the areas targeted by this assay, and inadequate number of viral copies(<138 copies/mL). A negative result must be combined with clinical observations, patient history, and epidemiological information. The expected result is Negative.  Fact Sheet for Patients:  EntrepreneurPulse.com.au  Fact Sheet for Healthcare Providers:  IncredibleEmployment.be  This test is no t yet approved or cleared by the [place] FDA and  has been authorized for detection and/or diagnosis of SARS-CoV-2 by FDA under an Emergency Use Authorization (EUA). This EUA will remain  in effect (meaning this test can be used) for the duration of the COVID-19 declaration under Section 564(b)(1) of the Act, 21 U.S.C.section 360bbb-3(b)(1), unless the authorization is terminated  or revoked sooner.       Influenza A by PCR NEGATIVE NEGATIVE Final   Influenza B by PCR NEGATIVE NEGATIVE Final    Comment: (NOTE) The Xpert Xpress SARS-CoV-2/FLU/RSV plus assay is intended as an aid in the diagnosis of influenza from Nasopharyngeal swab specimens and should not be used as a sole basis for treatment. Nasal washings and aspirates are unacceptable for Xpert Xpress SARS-CoV-2/FLU/RSV testing.  Fact Sheet for Patients: EntrepreneurPulse.com.au  Fact Sheet for Healthcare Providers: IncredibleEmployment.be  This test is not yet approved or cleared by the [place] FDA and has been authorized for detection and/or diagnosis of SARS-CoV-2 by FDA under an Emergency Use Authorization (EUA). This EUA will remain in effect (meaning this test can be used) for the duration of the COVID-19 declaration under Section 564(b)(1) of the Act, 21 U.S.C. section 360bbb-3(b)(1), unless the authorization  is terminated or revoked.  Performed at [facility], [place] [address], [place], [place] [address]   Culture, body fluid w Gram Stain-bottle     Status: None (Preliminary result)   Collection Time: 05/10/22 12:58 PM   Specimen: Pleura  Result Value Ref Range Status   Specimen Description PLEURAL  Final   Special Requests NONE  Final   Culture   Final    NO GROWTH < 24 HOURS Performed at [facility], [place] [address], [place], [place] [address]    Report Status PENDING  Incomplete  Gram stain     Status: None   Collection Time: 05/10/22 12:58 PM   Specimen: Pleura  Result Value Ref Range Status   Specimen Description PLEURAL  Final   Special Requests NONE  Final   Gram Stain   Final    NO WBC SEEN NO ORGANISMS SEEN Performed at [facility], [address], [place], [place] [address]    Report Status 05/10/2022 FINAL  Final     Time coordinating discharge:  I have spent 35 minutes face to face with the patient and on the ward discussing the patients care, assessment, plan and disposition with other care givers. >50% of the time was devoted counseling the patient about the risks and benefits of treatment/Discharge disposition and coordinating care.   SIGNED:   Damita Lack, MD  [facility] 05/11/2022, 11:59 AM   If 7PM-7AM, please contact night-coverage

## 2022-05-11 NOTE — Progress Notes (Signed)
TAKE these medications     acetaminophen 325 MG tablet Commonly known as: TYLENOL Take 2 tablets (650 mg total) by mouth every 6 (six) hours as needed for mild pain, fever or headache.    albuterol 108 (90 Base) MCG/ACT inhaler Commonly known as: VENTOLIN HFA Inhale 2 puffs by mouth into the lungs every 6 hours as needed for wheezing or shortness of breath    alclomethasone 0.05 % ointment Commonly known as: ACLOVATE apply topically to the lips once daily as needed What changed: See the new instructions.    ALPRAZolam 0.25 MG tablet Commonly known as: XANAX Take 0.25 mg by mouth 2 (two) times daily as needed for anxiety.    ascorbic acid 500 MG tablet Commonly known as: VITAMIN C Take 1 tablet (500 mg total) by mouth 2 (two) times daily.    aspirin 81 MG chewable tablet Chew 1 tablet (81 mg total) by mouth daily.    atorvastatin 40 MG tablet Commonly known as: LIPITOR Take 1 tablet (40 mg total) by mouth daily. What changed: when to take this    clopidogrel 75 MG tablet Commonly known as: PLAVIX Take 1 tablet (75 mg total) by mouth daily.    cyanocobalamin 1000 MCG tablet Take 1 tablet (1,000 mcg total) by mouth daily.    diphenoxylate-atropine 2.5-0.025 MG tablet Commonly known as: LOMOTIL Take 1 tablet by mouth 4 (four) times daily as needed for diarrhea or loose stools.    docusate sodium 100 MG capsule Commonly known as: COLACE Take 1 capsule (100 mg total) by mouth 2 (two) times daily.    furosemide 40 MG tablet Commonly known as: LASIX Take 1 tablet (40 mg total) by mouth daily. Start taking on: May 12, 2022    levETIRAcetam 100 MG/ML solution Commonly known as: KEPPRA Take 5 mLs (500 mg total) by mouth 2 (two) times daily.    losartan 25 MG tablet Commonly known as: COZAAR Take 1 tablet (25 mg total) by mouth daily. Start taking on: May 12, 2022    Lubricant Eye Drops 0.4-0.3 % Soln Generic drug: Polyethyl Glycol-Propyl Glycol Place 1 drop into  both eyes daily.    Melatonin 10 MG Tabs Take 10 mg by mouth at bedtime.    metoprolol succinate 50 MG 24 hr tablet Commonly known as: TOPROL-XL Take 1 tablet (50 mg total) by mouth daily. Take with or immediately following a meal. Start taking on: May 12, 2022    mirtazapine 15 MG tablet Commonly known as: REMERON Take 15 mg by mouth at bedtime.    neomycin-bacitracin-polymyxin [phone] ointment Apply 1 application. topically See admin instructions. Cleanse right leg with soap and water, pat dry, apply TAO,cover with non adherent dressing qd until healed    pantoprazole sodium 40 mg Commonly known as: PROTONIX Take 40 mg by mouth daily.    polyethylene glycol powder 17 GM/SCOOP powder Commonly known as: GLYCOLAX/MIRALAX Take 17 g by mouth daily as needed for mild constipation.    sertraline 50 MG tablet Commonly known as: ZOLOFT Take 50 mg by mouth daily.    traMADol 50 MG tablet Commonly known as: ULTRAM Take 1 tablet (50 mg total) by mouth every 12 (twelve) hours as needed for severe pain.    TUMS PO Take 2 tablets by mouth 4 (four) times daily as needed (heartburn/indigestion).    Vitamin D 50 MCG (2000 UT) tablet Take 2,000 Units by mouth daily.    zinc sulfate 220 (50 Zn) MG capsule Take 1  capsule (220 mg total) by mouth daily.    zolpidem 10 MG tablet Commonly known as: AMBIEN Take 1 tablet (10 mg total) by mouth at bedtime as needed. for sleep

## 2022-05-11 NOTE — Progress Notes (Signed)
Physical Therapy Treatment Patient Details Name: Wendy [name]. Nay MRN: [id number] DOB: 28-Dec-1945 Today's Date: 05/11/2022   History of Present Illness Pt is a 76 y/o female admitted 05/06/22 secondary to CHF exacerbation. PMH includes HTN, multiple myeloma, CVA, DVT, and hep A.    PT Comments    The pt was eager to participate in PT session this morning, reports continued weakness and fatigue. The pt needed increased assist and support to complete sit-stand transfers and gait at this time, but SpO2 remained stable on RA with all activity. The pt required up to modA to complete sit-stand transfers given poor functional power in BLE and BLE, as well as to correct posterior lean. She was limited to max of 25 ft ambulation with RW and up to modA, will benefit from continued skilled PT and return to ALF with increased assistance.     Recommendations for follow up therapy are one component of a multi-disciplinary discharge planning process, led by the attending physician.  Recommendations may be updated based on patient status, additional functional criteria and insurance authorization.  Follow Up Recommendations  Home health PT (resume PT at ALF)     Assistance Recommended at Discharge Frequent or constant Supervision/Assistance  Patient can return home with the following A little help with walking and/or transfers;A little help with bathing/dressing/bathroom;Assistance with cooking/housework;Help with stairs or ramp for entrance;Assist for transportation;Direct supervision/assist for financial management;Direct supervision/assist for medications management   Equipment Recommendations  None recommended by PT    Recommendations for Other Services       Precautions / Restrictions Precautions Precautions: Fall Precaution Comments: on RA with mobility Restrictions Weight Bearing Restrictions: No     Mobility  Bed Mobility Overal bed mobility: Needs Assistance Bed Mobility: Supine to Sit      Supine to sit: Min assist, HOB elevated     General bed mobility comments: pt able to initiate, but unable to complete movement or scoot to EOB, use of bed rail with little movement generated from use of UE    Transfers Overall transfer level: Needs assistance Equipment used: Rolling walker (2 wheels) Transfers: Sit to/from Stand Sit to Stand: Mod assist, Min assist, Min guard           General transfer comment: cues for UE use, ranging from modA to minG with reps and depending on pt fatigue. pt with significant posterior lean with increased time, cues, and assist to shift wt forwards to correct. completed x6 through session    Ambulation/Gait Ambulation/Gait assistance: Min assist, Mod assist Gait Distance (Feet): 25 Feet (+ 8) Assistive device: Rolling walker (2 wheels) Gait Pattern/deviations: Step-through pattern, Decreased step length - right, Decreased stride length, Drifts right/left, Trunk flexed, Narrow base of support (drifts to L side of RW) Gait velocity: reduced Gait velocity interpretation: <1.31 ft/sec, indicative of household ambulator   General Gait Details: pt with small, slow steps with minimal clearance and prolonged static stance between each step to "build her strength" minA to modA at times to manage stability and RW movements, especially with fatigue. SpO2 > 92% with all activity on RA      Balance Overall balance assessment: Needs assistance Sitting-balance support: No upper extremity supported, Feet supported Sitting balance-Leahy Scale: Fair     Standing balance support: Bilateral upper extremity supported Standing balance-Leahy Scale: Poor Standing balance comment: Reliant on BUE support and external support  Cognition Arousal/Alertness: Awake/alert Behavior During Therapy: WFL for tasks assessed/performed Overall Cognitive Status: Within Functional Limits for tasks assessed                                  General Comments: Follows commands appropriately. Needs increased time to process or sequence, but likely baseline.        Exercises Other Exercises Other Exercises: sit <> stand x3 reps from recliner before pt too fatigued to continue    General Comments General comments (skin integrity, edema, etc.): VSS on RA with all mobility      Pertinent Vitals/Pain Pain Assessment Pain Assessment: Faces Faces Pain Scale: No hurt Pain Intervention(s): Limited activity within patient's tolerance, Monitored during session     PT Goals (current goals can now be found in the care plan section) Acute Rehab PT Goals Patient Stated Goal: to get stronger PT Goal Formulation: With patient Time For Goal Achievement: 05/21/22 Potential to Achieve Goals: Good Progress towards PT goals: Progressing toward goals    Frequency    Min 3X/week      PT Plan Current plan remains appropriate       AM-PAC PT "6 Clicks" Mobility   Outcome Measure  Help needed turning from your back to your side while in a flat bed without using bedrails?: A Little Help needed moving from lying on your back to sitting on the side of a flat bed without using bedrails?: A Little Help needed moving to and from a bed to a chair (including a wheelchair)?: A Little Help needed standing up from a chair using your arms (e.g., wheelchair or bedside chair)?: A Little Help needed to walk in hospital room?: A Little Help needed climbing 3-5 steps with a railing? : A Lot 6 Click Score: 17    End of Session Equipment Utilized During Treatment: Gait belt Activity Tolerance: Patient tolerated treatment well Patient left: in chair;with call bell/phone within reach;with chair alarm set Nurse Communication: Mobility status PT Visit Diagnosis: Unsteadiness on feet (R26.81);Muscle weakness (generalized) (M62.81);Difficulty in walking, not elsewhere classified (R26.2);Other abnormalities of gait and mobility  (R26.89)     Time: 6759-1638 PT Time Calculation (min) (ACUTE ONLY): 32 min  Charges:  $Therapeutic Exercise: 23-37 mins                     West Carbo, PT, DPT   Acute Rehabilitation Department Pager #: [phone]   Sandra Cockayne 05/11/2022, 11:25 AM

## 2022-05-11 NOTE — Progress Notes (Signed)
Patient discharged in stable condition. IV and cardiac monitor removed with no complications. All education completed with evidence of understanding. Patient picked up by ambulance and transported back to ALF. Report called to [facility]. All questions and concerns addressed.

## 2022-05-11 NOTE — Telephone Encounter (Signed)
Thanks for the heads up. How is she doing?   Does the letter have to be addressed to anyone in particular?  Does the pt need to sign an  auth giving me permission to share her medical info with the ins co?   I would think they would just be happy to be paid by anyone - unsure why they would need that , seems weird.

## 2022-05-11 NOTE — Telephone Encounter (Signed)
[facility] Night - Client Nonclinical Telephone Record  [facility] Client [facility] Night - Client Client Site [place] Provider Loura Pardon - MD Contact Type Call Who Is Calling Patient / Member / Family / Caregiver Caller Name Azalia Bilis Phone Number [phone] Patient Name Nolie Bignell Patient DOB 20-Jun-1946 Call Type Message Only Information Provided Reason for Call Request for General Office Information Initial Comment Caller states PT recently had a stroke and in back in the hospital. Would like Dr. Glori Bickers to look at [place] to get up to date. PT recently moved into assisted living and is too far away to go to the office. Caller has access to PT MyChart. Caller will be getting a letter from credit card company with instructions to pay PT bills. Will need a letter from the MD to verify PT has had a stroke. Would like a call back stating they can help. Would like to know if this is a normal instance with being POA. Additional Comment Provided office hours Disp. Time Disposition Final User 05/10/2022 5:25:49 PM General Information Provided Yes Ian Malkin Call Closed By: Ian Malkin Transaction Date/Time: 05/10/2022 5:20:22 PM (ET

## 2022-05-11 NOTE — Care Management Important Message (Signed)
Important Message  Patient Details  Name: Wendy [name]. [place] MRN: [id number] Date of Birth: [birth year]   Medicare Important Message Given:  Yes     Orbie Pyo 05/11/2022, 3:34 PM

## 2022-05-11 NOTE — Chronic Care Management (AMB) (Signed)
  Chronic Care Management Note  05/11/2022 Name: Wendy [name] MRN: [id number] DOB: [birth year]  Wendy [name] is a 76 y.o. year old female who is a primary care patient of [facility], Wynelle Fanny, MD and is actively engaged with the care management team. I reached out to [place]. [place] by phone today to assist with re-scheduling a follow up visit with the RN Case Manager  Follow up plan: 2nd Unsuccessful telephone outreach attempt made. A HIPAA compliant phone message was left for the patient providing contact information and requesting a return call.   Julian Hy, [place] Management  Direct Dial: [phone]

## 2022-05-15 LAB — CULTURE, BODY FLUID W GRAM STAIN -BOTTLE: Culture: NO GROWTH

## 2022-05-15 NOTE — Telephone Encounter (Signed)
OOP cost is $0 Patient was in the hospital recently. Calcium was abnormal. Spoke with Dr Glori Bickers, patient has hospital follow up on 05/22/22, hold Prolia until follow up appointment per Dr Glori Bickers

## 2022-05-15 NOTE — Telephone Encounter (Addendum)
Left VM requesting pt's daughter Amy to call the office back

## 2022-05-16 ENCOUNTER — Telehealth (HOSPITAL_COMMUNITY): Payer: Self-pay | Admitting: *Deleted

## 2022-05-16 ENCOUNTER — Ambulatory Visit (HOSPITAL_COMMUNITY)
Admit: 2022-05-16 | Discharge: 2022-05-16 | Disposition: A | Discharge status: Home / Self Care | Payer: Medicare Other | Attending: Cardiology | Admitting: Cardiology

## 2022-05-16 NOTE — Telephone Encounter (Signed)
Call attempted to confirm HV TOC appt 2 pm on 05/16/22. HIPPA appropriate VM left with callback number.    Earnestine Leys, BSN, Clinical cytogeneticist Only

## 2022-05-16 NOTE — Progress Notes (Incomplete)
HEART & VASCULAR TRANSITION OF CARE CONSULT NOTE     Referring Physician: Primary Care: Primary Cardiologist:  HPI: Referred to clinic by *** for heart failure consultation.   Cardiac Testing    Review of Systems: [y] = yes, '[ ]'  = no   General: Weight gain '[ ]' ; Weight loss '[ ]' ; Anorexia '[ ]' ; Fatigue '[ ]' ; Fever '[ ]' ; Chills '[ ]' ; Weakness '[ ]'   Cardiac: Chest pain/pressure '[ ]' ; Resting SOB '[ ]' ; Exertional SOB '[ ]' ; Orthopnea '[ ]' ; Pedal Edema '[ ]' ; Palpitations '[ ]' ; Syncope '[ ]' ; Presyncope '[ ]' ; Paroxysmal nocturnal dyspnea'[ ]'   Pulmonary: Cough '[ ]' ; Wheezing'[ ]' ; Hemoptysis'[ ]' ; Sputum '[ ]' ; Snoring '[ ]'   GI: Vomiting'[ ]' ; Dysphagia'[ ]' ; Melena'[ ]' ; Hematochezia '[ ]' ; Heartburn'[ ]' ; Abdominal pain '[ ]' ; Constipation '[ ]' ; Diarrhea '[ ]' ; BRBPR '[ ]'   GU: Hematuria'[ ]' ; Dysuria '[ ]' ; Nocturia'[ ]'   Vascular: Pain in legs with walking '[ ]' ; Pain in feet with lying flat '[ ]' ; Non-healing sores '[ ]' ; Stroke '[ ]' ; TIA '[ ]' ; Slurred speech '[ ]' ;  Neuro: Headaches'[ ]' ; Vertigo'[ ]' ; Seizures'[ ]' ; Paresthesias'[ ]' ;Blurred vision '[ ]' ; Diplopia '[ ]' ; Vision changes '[ ]'   Ortho/Skin: Arthritis '[ ]' ; Joint pain '[ ]' ; Muscle pain '[ ]' ; Joint swelling '[ ]' ; Back Pain '[ ]' ; Rash '[ ]'   Psych: Depression'[ ]' ; Anxiety'[ ]'   Heme: Bleeding problems '[ ]' ; Clotting disorders '[ ]' ; Anemia '[ ]'   Endocrine: Diabetes '[ ]' ; Thyroid dysfunction'[ ]'    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 achieved remission (HCC)    Osteoporosis     Current Outpatient Medications  Medication Sig Dispense Refill   acetaminophen (TYLENOL) 325 MG tablet Take 2 tablets (650 mg total) by mouth every 6 (six) hours as needed for mild pain, fever or headache.      albuterol (VENTOLIN HFA) 108 (90 Base) MCG/ACT inhaler Inhale 2 puffs by mouth into the lungs every 6 hours as needed for wheezing or shortness of breath 8.5 g 0   alclomethasone (ACLOVATE) 0.05 % ointment apply topically to the lips once daily as needed (Patient taking differently: Apply 1 application. topically 2 (two) times daily as needed (eczema or rash).) 30 g 0   ALPRAZolam (XANAX) 0.25 MG tablet Take 0.25 mg by mouth 2 (two) times daily as needed for anxiety.     ascorbic acid (VITAMIN C) 500 MG tablet Take 1 tablet (500 mg total) by mouth 2 (two) times daily.     aspirin 81 MG chewable tablet Chew 1 tablet (81 mg total) by mouth daily.     atorvastatin (LIPITOR) 40 MG tablet Take 1 tablet (40 mg total) by mouth daily. (Patient taking differently: Take 40 mg by mouth every evening.)     Calcium Carbonate Antacid (TUMS PO) Take 2 tablets by mouth 4 (four) times daily as needed (heartburn/indigestion).     Cholecalciferol (VITAMIN D) 2000 UNITS tablet Take 2,000 Units by mouth daily.     clopidogrel (PLAVIX) 75 MG tablet Take 1 tablet (  75 mg total) by mouth daily.     diphenoxylate-atropine (LOMOTIL) 2.5-0.025 MG tablet Take 1 tablet by mouth 4 (four) times daily as needed for diarrhea or loose stools. 10 tablet 0   docusate sodium (COLACE) 100 MG capsule Take 1 capsule (100 mg total) by mouth 2 (two) times daily. 10 capsule 0   furosemide (LASIX) 40 MG tablet Take 1 tablet (40 mg total) by mouth daily. 30 tablet 0   levETIRAcetam (KEPPRA) 100 MG/ML solution Take 5 mLs (500 mg total) by mouth 2 (two) times daily. 473 mL 12   losartan (COZAAR) 25 MG tablet Take 1 tablet (25 mg total) by mouth daily. 30 tablet 0   Melatonin 10 MG TABS Take 10 mg by mouth at bedtime.     metoprolol succinate (TOPROL-XL) 50 MG 24 hr tablet Take 1 tablet (50 mg total) by mouth daily. Take with or immediately following a meal. 30 tablet 0   mirtazapine (REMERON) 15 MG tablet Take 15 mg by mouth at bedtime.      neomycin-bacitracin-polymyxin (NEOSPORIN) [phone] ointment Apply 1 application. topically See admin instructions. Cleanse right leg with soap and water, pat dry, apply TAO,cover with non adherent dressing qd until healed     pantoprazole sodium (PROTONIX) 40 mg Take 40 mg by mouth daily.     Polyethyl Glycol-Propyl Glycol (LUBRICANT EYE DROPS) 0.4-0.3 % SOLN Place 1 drop into both eyes daily.     polyethylene glycol powder (GLYCOLAX/MIRALAX) 17 GM/SCOOP powder Take 17 g by mouth daily as needed for mild constipation.     sertraline (ZOLOFT) 50 MG tablet Take 50 mg by mouth daily.     traMADol (ULTRAM) 50 MG tablet Take 1 tablet (50 mg total) by mouth every 12 (twelve) hours as needed for severe pain. 30 tablet 0   vitamin B-12 1000 MCG tablet Take 1 tablet (1,000 mcg total) by mouth daily.     zinc sulfate 220 (50 Zn) MG capsule Take 1 capsule (220 mg total) by mouth daily.     zolpidem (AMBIEN) 10 MG tablet Take 1 tablet (10 mg total) by mouth at bedtime as needed. for sleep 30 tablet 3   No current facility-administered medications for this encounter.    Allergies  Allergen Reactions   Bentyl [Dicyclomine Hcl] Other (See Comments)    Groggy, blurred vision   Butalbital-Aspirin-Caffeine Other (See Comments)    hallucinations   Clonidine Derivatives Other (See Comments)    dizziness, lightheadedness, abdominal cramping, dry mouth/throat   Linzess [Linaclotide] Diarrhea   Morphine And Related Other (See Comments)    Does not work   Motrin [Ibuprofen] Other (See Comments)    GI upset   Penicillins Other (See Comments)    As child; reaction unknown Has patient had a PCN reaction causing immediate rash, facial/tongue/throat swelling, SOB or lightheadedness with hypotension: No Has patient had a PCN reaction causing severe rash involving mucus membranes or skin necrosis: No Has patient had a PCN reaction that required hospitalization: No Has patient had a PCN reaction occurring within  the last 10 years: No If all of the above answers are "NO", then may proceed with Cephalosporin use.   Sulfa Antibiotics Other (See Comments)    In childhood   Zanaflex [Tizanidine Hcl] Other (See Comments)    Decreased BP   Diphenhydramine Hcl Palpitations    restlessness      Social History   Socioeconomic History   Marital status: Divorced    Spouse name: Not on  file   Number of children: 1   Years of education: Not on file   Highest education level: Bachelor's degree (e.g., BA, AB, BS)  Occupational History   Occupation: Takes care of Toddlers    Employer: RETIRED  Tobacco Use   Smoking status: Former    Packs/day: 1.00    Years: 32.00    Pack years: 32.00    Types: Cigarettes    Quit date: 12/24/1993    Years since quitting: 28.4   Smokeless tobacco: Never  Vaping Use   Vaping Use: Never used  Substance and Sexual Activity   Alcohol use: Not Currently    Alcohol/week: 7.0 - 10.0 standard drinks    Types: 7 - 10 Glasses of wine per week    Comment: 1 glasses of wine per day, none in months   Drug use: No   Sexual activity: Never  Other Topics Concern   Not on file  Social History Narrative   Is divorced for years.   Is very active - - works on [employer] care of Toddlers   Twin grandsons - 35 months in [place].   Vegetarian   Social Determinants of Radio broadcast assistant Strain: Low Risk    Difficulty of Paying Living Expenses: Not hard at all  Food Insecurity: No Food Insecurity   Worried About Charity fundraiser in the Last Year: Never true   Ran Out of Food in the Last Year: Never true  Transportation Needs: No Transportation Needs   Lack of Transportation (Medical): No   Lack of Transportation (Non-Medical): No  Physical Activity: Inactive   Days of Exercise per Week: 0 days   Minutes of Exercise per Session: 0 min  Stress: No Stress Concern Present   Feeling of Stress : Not at all  Social Connections: Socially Isolated   Frequency of  Communication with Friends and Family: Twice a week   Frequency of Social Gatherings with Friends and Family: Once a week   Attends Religious Services: Never   Marine scientist or Organizations: No   Attends Music therapist: Never   Marital Status: Never married  Human resources officer Violence: Not At Risk   Fear of Current or Ex-Partner: No   Emotionally Abused: No   Physically Abused: No   Sexually Abused: No      Family History  Problem Relation Age of Onset   Alcohol abuse Mother    Lung cancer Mother 78       Lung (not entirely sure), Smoker, Drinker   Alcohol abuse Father    Hyperlipidemia Father    Heart disease Father 38       MI   Birth defects Daughter    Heart disease Paternal Grandfather        MI   Colon cancer Neg Hx    AAA (abdominal aortic aneurysm) Neg Hx    Stomach cancer Neg Hx    Breast cancer Neg Hx    Esophageal cancer Neg Hx    Rectal cancer Neg Hx     There were no vitals filed for this visit.  PHYSICAL EXAM: General:  Well appearing. No respiratory difficulty HEENT: normal Neck: supple. no JVD. Carotids 2+ bilat; no bruits. No lymphadenopathy or thryomegaly appreciated. Cor: PMI nondisplaced. Regular rate & rhythm. No rubs, gallops or murmurs. Lungs: clear Abdomen: soft, nontender, nondistended. No hepatosplenomegaly. No bruits or masses. Good bowel sounds. Extremities: no cyanosis, clubbing, rash, edema Neuro:  alert & oriented x 3, cranial nerves grossly intact. moves all 4 extremities w/o difficulty. Affect pleasant.  ECG:   ASSESSMENT & PLAN:  NYHA *** GDMT  Diuretic- BB- Ace/ARB/ARNI MRA SGLT2i    Referred to HFSW (PCP, Medications, Transportation, ETOH Abuse, Drug Abuse, Insurance, Financial ): Yes or No Refer to Pharmacy: Yes or No Refer to Home Health: Yes on No Refer to Advanced Heart Failure Clinic: Yes or no  Refer to General Cardiology: Yes or No  Follow up

## 2022-05-17 ENCOUNTER — Inpatient Hospital Stay: Payer: Medicare Other | Attending: Hematology and Oncology | Admitting: Nurse Practitioner

## 2022-05-17 ENCOUNTER — Inpatient Hospital Stay: Payer: Medicare Other

## 2022-05-17 ENCOUNTER — Inpatient Hospital Stay: Payer: Medicare Other | Admitting: Hematology and Oncology

## 2022-05-17 ENCOUNTER — Other Ambulatory Visit: Payer: Self-pay | Admitting: Hematology and Oncology

## 2022-05-17 DIAGNOSIS — C9000 Multiple myeloma not having achieved remission: Secondary | ICD-10-CM

## 2022-05-18 ENCOUNTER — Non-Acute Institutional Stay: Payer: Medicare Other | Admitting: Hospice

## 2022-05-18 DIAGNOSIS — E43 Unspecified severe protein-calorie malnutrition: Secondary | ICD-10-CM

## 2022-05-18 DIAGNOSIS — I5033 Acute on chronic diastolic (congestive) heart failure: Secondary | ICD-10-CM

## 2022-05-18 DIAGNOSIS — Z515 Encounter for palliative care: Secondary | ICD-10-CM

## 2022-05-18 DIAGNOSIS — C9000 Multiple myeloma not having achieved remission: Secondary | ICD-10-CM

## 2022-05-18 DIAGNOSIS — R531 Weakness: Secondary | ICD-10-CM

## 2022-05-18 NOTE — Telephone Encounter (Signed)
Daughter never called back but pt has hospital f/u scheduled next week will check then

## 2022-05-18 NOTE — Progress Notes (Signed)
[facility] Pall[facility] Consult Note Telephone: [phone]  Fax: [phone]   Date of encounter: 05/18/22 10:51 AM PATIENT NAME: Wendy Basta L. [address] 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 Palliative Care was asked to follow this patient by consultation request of  Vilinda Boehringer, NP to address advance care planning and complex medical decision making. This is a follow up visit.  NP called Amy and left her a voicemail with callback number.                                    ASSESSMENT AND PLAN / RECOMMENDATIONS:  CODE STATUS: Discussion on ramifications and implications of CODE STATUS.  Patient affirmed she is a full Code.  She is open to reconsider her CODE STATUS in the future Goals of Care: Goals include to maximize quality of life and symptom management.   Palliative medicine will continue to provide support, symptom management. Daughter would like for palliative to continue once she moves to [place].  Symptom Management/Plan: Acute on chronic CHF: Hospitalized 5/14 - 05/11/2022 treated with IV diuretic.  Continue furosemide as ordered.  Adhere to salt/fluid limits.  Monitor weight and report weight increase of 2 pounds in a day or 5 pounds in a week.  Multiple Myeloma: Completed chemo. Plan is to resume monthly immunotherapy shot. Follow up with Oncology as planned.   Protein calorie malnutrition; weight loss, poor appetite.  Current  weight 89 pounds. albumin 2.7 05/06/2022.  Continue mirtazapine as ordered.  Take boost twice daily.  Routine CBC CMP  Generalized weakness: secondary to CVA, HF. PT/OT is ongoing.  Balance of rest and performance activity.  Fall precautions. Follow up: Palliative care will continue to follow for complex medical decision making, advance care planning, and clarification of goals. Return 6 weeks or prn. Encouraged to call provider sooner with any concerns.  PPS: 40%  HOSPICE ELIGIBILITY/DIAGNOSIS: TBD  Chief Complaint: Palliative Medicine initial consult.   HISTORY OF PRESENT ILLNESS:  Wendy [name] is a 76 y.o. year old female with multiple morbidities requiring close monitoring/management with high risk of complications and morbidity: Multiple, protein caloric malnutrition, generalized weakness, CVA, essential hypertension, hyperlipidemia, amyloidosis, seizure disorder, CHF, AMS, CVA, seizure,. History obtained from review of EMR, discussion with primary team, and interview with family, facility staff/caregiver and/or Ms. Schmader.  I reviewed available labs, medications, imaging, studies and related documents from the EMR.  Records reviewed and summarized above.   ROS  General: NAD EYES: denies vision changes ENMT: denies dysphagia Cardiovascular: denies chest pain/discomfort Pulmonary: denies cough, denies increased SOB Abdomen: endorses poor to fair  appetite, incontinence of bowel GU: denies dysuria, incontinence of urine MSK: increased weakness Skin: denies rashes or wounds Neurological: denies insomnia Psych: Endorses stable mood Heme/lymph/immuno: denies bruises, abnormal bleeding  Physical Exam: Weight/height: 89 pounds/5 feet 2 inches Constitutional: NAD  General: frail appearing, thin EYES: anicteric sclera, lids intact, no discharge  ENMT: intact hearing, oral mucous membranes moist CV: S1S2, RRR Pulmonary: LCTA, no increased work of breathing, no cough, room air Abdomen:  normo-active BS + 4 quadrants, soft and non tender, no ascites GU: deferred MSK: + sarcopenia, moves all extremities, ambulatory with rolling walker Skin: warm and dry, no rashes or wounds on visible skin Neuro: Weakness, otherwise nonfocal Psych: non-anxious affect, A and O x 3 Hem/lymph/immuno: no widespread bruising  CURRENT PROBLEM LIST:  Patient Active Problem List   Diagnosis Date Noted   Pleural effusion    Acute on chronic heart failure with preserved ejection fraction (HFpEF) ([place]) 05/06/2022   Acute CVA (cerebrovascular accident) ([place]) 03/16/2022   Seizure ([place]) 03/16/2022   Protein-calorie malnutrition, severe 03/12/2022   Acute heart failure (HCC)    Altered mental status    Acute respiratory failure ([place]) 03/11/2022   Pain in buttock 02/09/2022   Low back pain 02/09/2022   Poor balance 02/09/2022   History of fall 02/09/2022   Generalized weakness 02/09/2022   Stress reaction 12/24/2021   Current use of proton pump inhibitor 10/27/2021   Full code status 05/31/2021   Flash pulmonary edema (HCC) 04/30/2021   Heme positive stool 04/30/2021   Hyperkalemia 04/30/2021   Compression fracture of T10 vertebra ([facility]) 03/14/2021   History of DVT (deep vein thrombosis) 03/13/2021   Multiple myeloma ([place]) 02/10/2021   Leucocytosis 02/10/2021   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   Situational anxiety 01/11/2021   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   Hyperlipidemia 10/19/2020   Hearing loss 06/14/2020   Pedal edema 06/01/2020   Aortic atherosclerosis ([place]) 04/06/2020   CAD (coronary artery disease) 04/06/2020   H/O compression fracture of spine 04/06/2020   Chronic back pain 04/06/2020   Abnormal CT scan of lung 03/17/2020   Heartburn 06/02/2018   Chronic constipation 12/31/2017   Blood glucose elevated 09/25/2017   History of ileus 06/07/2017   Right carpal tunnel syndrome 12/07/2016   Estrogen deficiency 09/24/2016   Routine general medical examination at a  health care facility 09/04/2015   Colon cancer screening 12/11/2014   Encounter for Medicare annual wellness exam 05/17/2013   Osteoarthritis 03/28/2011   Degenerative disc disease, lumbar 03/28/2011   Hyponatremia 02/12/2011   Essential hypertension 08/01/2010   Osteoporosis 08/01/2010   PAST MEDICAL HISTORY:  Active Ambulatory Problems    Diagnosis Date Noted   Essential hypertension 08/01/2010   Osteoporosis 08/01/2010   Hyponatremia 02/12/2011   Osteoarthritis 03/28/2011   Degenerative disc disease, lumbar 03/28/2011   Encounter for Medicare annual wellness exam 05/17/2013   Colon cancer screening 12/11/2014   Routine general medical examination at a health care facility 09/04/2015   Estrogen deficiency 09/24/2016   Right carpal tunnel syndrome 12/07/2016   History of ileus 06/07/2017   Blood glucose elevated 09/25/2017   Chronic constipation 12/31/2017   Heartburn 06/02/2018   Abnormal CT scan of lung 03/17/2020   Aortic atherosclerosis ([place]) 04/06/2020   CAD (coronary artery disease) 04/06/2020   H/O compression fracture of spine 04/06/2020   Chronic back pain 04/06/2020   Pedal edema 06/01/2020   Hearing loss 06/14/2020   Hyperlipidemia 10/19/2020   Situational anxiety 01/11/2021   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   Light chain (AL) amyloidosis ([facility]) 02/03/2021   Multiple myeloma ([place]) 02/10/2021   Leucocytosis 02/10/2021   History of DVT (deep vein thrombosis) 03/13/2021   Compression fracture of T10 vertebra ([facility]) 03/14/2021   Flash pulmonary  edema ([place]) 04/30/2021   Heme positive stool 04/30/2021   Hyperkalemia 04/30/2021   Full code status 05/31/2021   Current use of proton pump inhibitor 10/27/2021   Stress reaction 12/24/2021   Pain in buttock 02/09/2022   Low back pain 02/09/2022   Poor balance 02/09/2022   History of fall 02/09/2022   Generalized weakness 02/09/2022   Acute respiratory failure ([place]) 03/11/2022   Acute heart failure (HCC)    Altered  mental status    Protein-calorie malnutrition, severe 03/12/2022   Acute CVA (cerebrovascular accident) ([facility]) 03/16/2022   Seizure ([facility]) 03/16/2022   Acute on chronic heart failure with preserved ejection fraction (HFpEF) ([place]) 05/06/2022   Pleural effusion    Resolved Ambulatory Problems    Diagnosis Date Noted   Sinusitis, acute 06/10/2013   Bloating 06/02/2018   Respiratory failure, acute ([place]) 02/10/2021   Elevated troponin I level 02/10/2021   Fall at home, initial encounter 03/14/2021   DNR (do not resuscitate) 04/30/2021   Volume overload 05/01/2021   Past Medical History:  Diagnosis Date   Allergy    Arthritis    Cataract    Colon polyps    Foot fracture    GERD (gastroesophageal reflux disease)    Hepatitis A    History of miscarriage    Hypertension    Insomnia    Multiple myeloma not having achieved remission ([facility])    Osteoporosis    SOCIAL HX:  Social History   Tobacco Use   Smoking status: Former    Packs/day: 1.00    Years: 32.00    Pack years: 32.00    Types: Cigarettes    Quit date: 12/24/1993    Years since quitting: 28.4   Smokeless tobacco: Never  Substance Use Topics   Alcohol use: Not Currently    Alcohol/week: 7.0 - 10.0 standard drinks    Types: 7 - 10 Glasses of wine per week    Comment: 1 glasses of wine per day, none in months   FAMILY HX:  Family History  Problem Relation Age of Onset   Alcohol abuse Mother    Lung cancer Mother 20       Lung (not entirely sure), Smoker, Drinker   Alcohol abuse Father    Hyperlipidemia Father    Heart disease Father 56       MI   Birth defects Daughter    Heart disease Paternal Grandfather        MI   Colon cancer Neg Hx    AAA (abdominal aortic aneurysm) Neg Hx    Stomach cancer Neg Hx    Breast cancer Neg Hx    Esophageal cancer Neg Hx    Rectal cancer Neg Hx       ALLERGIES:  Allergies  Allergen Reactions   Bentyl [Dicyclomine Hcl] Other (See Comments)    Groggy, blurred vision    Butalbital-Aspirin-Caffeine Other (See Comments)    hallucinations   Clonidine Derivatives Other (See Comments)    dizziness, lightheadedness, abdominal cramping, dry mouth/throat   Linzess [Linaclotide] Diarrhea   Morphine And Related Other (See Comments)    Does not work   Motrin [Ibuprofen] Other (See Comments)    GI upset   Penicillins Other (See Comments)    As child; reaction unknown Has patient had a PCN reaction causing immediate rash, facial/tongue/throat swelling, SOB or lightheadedness with hypotension: No Has patient had a PCN reaction causing severe rash involving mucus membranes or skin necrosis: No Has patient had  a PCN reaction that required hospitalization: No Has patient had a PCN reaction occurring within the last 10 years: No If all of the above answers are "NO", then may proceed with Cephalosporin use.   Sulfa Antibiotics Other (See Comments)    In childhood   Zanaflex [Tizanidine Hcl] Other (See Comments)    Decreased BP   Diphenhydramine Hcl Palpitations    restlessness     PERTINENT MEDICATIONS:  Outpatient Encounter Medications as of 05/18/2022  Medication Sig   acetaminophen (TYLENOL) 325 MG tablet Take 2 tablets (650 mg total) by mouth every 6 (six) hours as needed for mild pain, fever or headache.   albuterol (VENTOLIN HFA) 108 (90 Base) MCG/ACT inhaler Inhale 2 puffs by mouth into the lungs every 6 hours as needed for wheezing or shortness of breath   alclomethasone (ACLOVATE) 0.05 % ointment apply topically to the lips once daily as needed (Patient taking differently: Apply 1 application. topically 2 (two) times daily as needed (eczema or rash).)   ALPRAZolam (XANAX) 0.25 MG tablet Take 0.25 mg by mouth 2 (two) times daily as needed for anxiety.   ascorbic acid (VITAMIN C) 500 MG tablet Take 1 tablet (500 mg total) by mouth 2 (two) times daily.   aspirin 81 MG chewable tablet Chew 1 tablet (81 mg total) by mouth daily.   atorvastatin (LIPITOR) 40 MG tablet  Take 1 tablet (40 mg total) by mouth daily. (Patient taking differently: Take 40 mg by mouth every evening.)   Calcium Carbonate Antacid (TUMS PO) Take 2 tablets by mouth 4 (four) times daily as needed (heartburn/indigestion).   Cholecalciferol (VITAMIN D) 2000 UNITS tablet Take 2,000 Units by mouth daily.   clopidogrel (PLAVIX) 75 MG tablet Take 1 tablet (75 mg total) by mouth daily.   diphenoxylate-atropine (LOMOTIL) 2.5-0.025 MG tablet Take 1 tablet by mouth 4 (four) times daily as needed for diarrhea or loose stools.   docusate sodium (COLACE) 100 MG capsule Take 1 capsule (100 mg total) by mouth 2 (two) times daily.   furosemide (LASIX) 40 MG tablet Take 1 tablet (40 mg total) by mouth daily.   levETIRAcetam (KEPPRA) 100 MG/ML solution Take 5 mLs (500 mg total) by mouth 2 (two) times daily.   losartan (COZAAR) 25 MG tablet Take 1 tablet (25 mg total) by mouth daily.   Melatonin 10 MG TABS Take 10 mg by mouth at bedtime.   metoprolol succinate (TOPROL-XL) 50 MG 24 hr tablet Take 1 tablet (50 mg total) by mouth daily. Take with or immediately following a meal.   mirtazapine (REMERON) 15 MG tablet Take 15 mg by mouth at bedtime.   neomycin-bacitracin-polymyxin (NEOSPORIN) [phone] ointment Apply 1 application. topically See admin instructions. Cleanse right leg with soap and water, pat dry, apply TAO,cover with non adherent dressing qd until healed   pantoprazole sodium (PROTONIX) 40 mg Take 40 mg by mouth daily.   Polyethyl Glycol-Propyl Glycol (LUBRICANT EYE DROPS) 0.4-0.3 % SOLN Place 1 drop into both eyes daily.   polyethylene glycol powder (GLYCOLAX/MIRALAX) 17 GM/SCOOP powder Take 17 g by mouth daily as needed for mild constipation.   sertraline (ZOLOFT) 50 MG tablet Take 50 mg by mouth daily.   traMADol (ULTRAM) 50 MG tablet Take 1 tablet (50 mg total) by mouth every 12 (twelve) hours as needed for severe pain.   vitamin B-12 1000 MCG tablet Take 1 tablet (1,000 mcg total) by mouth  daily.   zinc sulfate 220 (50 Zn) MG capsule Take 1 capsule (  220 mg total) by mouth daily.   zolpidem (AMBIEN) 10 MG tablet Take 1 tablet (10 mg total) by mouth at bedtime as needed. for sleep   No facility-administered encounter medications on file as of 05/18/2022.  I spent  60 minutes providing this consultation; this includes time spent with patient/family, chart review and documentation. More than 50% of the time in this consultation was spent on counseling and coordinating communication  Thank you for the opportunity to participate in the care of Ms. Wyszynski.  The palliative care team will continue to follow. Please call our office at [phone] if we can be of additional assistance.   Teodoro Spray, NP

## 2022-05-22 ENCOUNTER — Inpatient Hospital Stay: Payer: Medicare Other | Admitting: Family Medicine

## 2022-05-22 ENCOUNTER — Encounter: Payer: Self-pay | Admitting: Family Medicine

## 2022-05-22 NOTE — Progress Notes (Deleted)
   Subjective:    Patient ID: Wendy [name], female    DOB: [birth year], 76 y.o.   MRN: [id number]  HPI Pt presents for f/u of hospitalization for CHF (prior to that CVA)    She presented to ER on 5/14 with sob on exertion  Hosp from 5/14 to 519- disch to ALF Tx with IV abx Had cardiac MRI  On 5/17-had evidence of early cardiac amyloid   A/P Assessment and Plan: Acute on chronic heart failure with preserved ejection fraction (HFpEF) ([place]) Echocardiogram March 2023-EF 50%, PAH Pt with known light chain amyloidosis disease. -Cardiology following: cardiac MRI- early amyloid - Home meds-aspirin/Plavix, normal Toprol-XL   Follow up planned with cardiology on 6/19 Dr Cathie Olden   Acute respiratory failure ([place]) Moderate bilateral pleural effusion Stable bilateral pulmonary nodule -Secondary to CHF exacerbation - CTA chest negative for PE Status post thoracentesis by IR on 5/18.  Breathing is significantly improved.   Light chain (AL) amyloidosis (HCC) -Follows with outpatient oncology team.  Cardiac MRI which is showing cardiac amyloid.  Follow-up outpatient   Essential hypertension -Home Norvasc and Lopressor discontinued.  Now on Toprol-XL, Lasix and losartan.  Losartan 25 mg daily Metoprolol xl 50 mg daily  Lasix 40 mg daily     Multiple myeloma (HCC) -Pathologic fracture of manubrium.  Follows outpatient oncology Getting monthly immunitx shot     History of CVA - CVA in March.  Currently on aspirin Plavix and statin  PT/OT is ongoing    History of seizure - On Keppra 500 mg twice daily   Severe protein calorie malnutrition - Dietitian consult  Taking mirtazapine  Boost supplement    Had a palliative consult in the hospital Still full code   Most recent labs Lab Results  Component Value Date   CREATININE 0.91 05/11/2022   BUN 14 05/11/2022   NA 135 05/11/2022   K 4.5 05/11/2022   CL 97 (L) 05/11/2022   CO2 30 05/11/2022  Ca is 8.1 GFR is over  60 Glucose 94 Lab Results  Component Value Date   ALT 21 05/06/2022   AST 28 05/06/2022   ALKPHOS 286 (H) 05/06/2022   BILITOT 0.9 05/06/2022   Albumin 2.7  Total protein 5.6  Lab Results  Component Value Date   WBC 10.3 05/06/2022   HGB 11.9 (L) 05/06/2022   HCT 35.0 (L) 05/06/2022   MCV 92.2 05/06/2022   PLT 496 (H) 05/06/2022    Lab Results  Component Value Date   TSH 8.303 (H) 03/14/2022  Free T4 elevated at 1.28     Review of Systems     Objective:   Physical Exam        Assessment & Plan:

## 2022-05-23 ENCOUNTER — Encounter: Payer: Self-pay | Admitting: Family Medicine

## 2022-05-25 NOTE — Chronic Care Management (AMB) (Signed)
  Chronic Care Management Note  05/25/2022 Name: [name] MRN: [id number] DOB: [birth year]  Wendy [name]. Matsen is a 76 y.o. year old female who is a primary care patient of [facility], Wynelle Fanny, MD and is actively engaged with the care management team. I reached out to [place]. Wieber by phone today to assist with re-scheduling a follow up visit with the RN Case Manager  Follow up plan: We have been unable to make contact with the patient for follow up. The care management team is available to follow up with the patient after provider conversation with the patient regarding recommendation for care management engagement and subsequent re-referral to the care management team.   Julian Hy, [place] Management  Direct Dial: [phone]

## 2022-05-28 NOTE — Telephone Encounter (Signed)
I have print this out , do I need to forward this message to Amy please advise.

## 2022-05-29 NOTE — Telephone Encounter (Signed)
Printed this off and pt this on your deck

## 2022-06-01 NOTE — Telephone Encounter (Signed)
Spoke with Amy, patient's daughter, she states they will know more in what direction to proceed after today. They are meeting with Hospice and she will sent Dr Glori Bickers a message about the update and what to do with Prolia. FYI

## 2022-06-01 NOTE — Telephone Encounter (Signed)
Thanks for letting me know, if that is the case we can stop the Prolia  Please ask if there is anything I can help with

## 2022-06-01 NOTE — Telephone Encounter (Signed)
Patient no showed on 05/22/22 appointment with Dr Glori Bickers, per last mychart message it is mentioned that patient is at [facility] assisted living and about trying to look for a more local GP. Dr Glori Bickers, how would you like me to proceed in regards to the patients Prolia? Thank you

## 2022-06-01 NOTE — Telephone Encounter (Signed)
If she can get it here, is better not to be late If that is not possible, will need to be with her new pcp

## 2022-06-05 NOTE — Telephone Encounter (Signed)
Spoke with Amy, patient is doing ok but she is enrolled in Hospice now. They will keep [place] posted.

## 2022-06-07 ENCOUNTER — Telehealth: Payer: Self-pay | Admitting: *Deleted

## 2022-06-07 NOTE — Telephone Encounter (Signed)
Received call from pt's daughter, Darnell Level. She is calling with updates on her mother's condition.  She states that Deleah is still at [facility] in [place]. Hospice was started with her mother last week. Amy states her mother is very weak, not eating much. She is awake and alert but essentially stays in bed or on the sofa all day/night. Sher has told Amy that she wants more chemo but Amy doesn't feel that her mother can handle it at this point. Amy is requesting that Dr. Lorenso Courier see Vaughan Basta and have labs done. Advised that Dr. Lorenso Courier can see her on 06/15/22 in the afternoon.Amy is agreeable to this.  Scheduling message sent.  Dr. Lorenso Courier made aware.

## 2022-06-08 NOTE — Telephone Encounter (Signed)
Amy advised and will pick this up next time they coming this way.

## 2022-06-08 NOTE — Telephone Encounter (Signed)
Lvm for pt's daughter/POA, Amy (on dpr), notifying her the letter is ready to pick up.   [Placed letter at front office.]

## 2022-06-10 ENCOUNTER — Encounter: Payer: Self-pay | Admitting: Cardiovascular Disease

## 2022-06-10 NOTE — Progress Notes (Signed)
This encounter was created in error - please disregard.

## 2022-06-11 ENCOUNTER — Encounter: Payer: Medicare Other | Admitting: Cardiovascular Disease

## 2022-06-14 ENCOUNTER — Other Ambulatory Visit: Payer: Medicare Other

## 2022-06-15 ENCOUNTER — Other Ambulatory Visit: Payer: Self-pay

## 2022-06-15 ENCOUNTER — Inpatient Hospital Stay: Attending: Hematology and Oncology | Admitting: Hematology and Oncology

## 2022-06-15 ENCOUNTER — Inpatient Hospital Stay

## 2022-06-15 VITALS — BP 132/49 | HR 73 | Temp 97.7°F | Resp 17 | Ht 62.0 in | Wt 85.1 lb

## 2022-06-15 DIAGNOSIS — E8581 Light chain (AL) amyloidosis: Secondary | ICD-10-CM | POA: Diagnosis present

## 2022-06-15 DIAGNOSIS — Z87891 Personal history of nicotine dependence: Secondary | ICD-10-CM | POA: Diagnosis not present

## 2022-06-15 DIAGNOSIS — Z801 Family history of malignant neoplasm of trachea, bronchus and lung: Secondary | ICD-10-CM | POA: Insufficient documentation

## 2022-06-15 DIAGNOSIS — R803 Bence Jones proteinuria: Secondary | ICD-10-CM | POA: Diagnosis not present

## 2022-06-15 DIAGNOSIS — C9000 Multiple myeloma not having achieved remission: Secondary | ICD-10-CM

## 2022-06-15 DIAGNOSIS — Z86718 Personal history of other venous thrombosis and embolism: Secondary | ICD-10-CM | POA: Diagnosis not present

## 2022-06-15 DIAGNOSIS — Z8673 Personal history of transient ischemic attack (TIA), and cerebral infarction without residual deficits: Secondary | ICD-10-CM | POA: Insufficient documentation

## 2022-06-15 LAB — CBC WITH DIFFERENTIAL (CANCER CENTER ONLY)
Abs Immature Granulocytes: 0.04 10*3/uL (ref 0.00–0.07)
Basophils Absolute: 0.1 10*3/uL (ref 0.0–0.1)
Basophils Relative: 1 %
Eosinophils Absolute: 0.2 10*3/uL (ref 0.0–0.5)
Eosinophils Relative: 1 %
HCT: 29.8 % — ABNORMAL LOW (ref 36.0–46.0)
Hemoglobin: 9.8 g/dL — ABNORMAL LOW (ref 12.0–15.0)
Immature Granulocytes: 0 %
Lymphocytes Relative: 12 %
Lymphs Abs: 1.5 10*3/uL (ref 0.7–4.0)
MCH: 29.6 pg (ref 26.0–34.0)
MCHC: 32.9 g/dL (ref 30.0–36.0)
MCV: 90 fL (ref 80.0–100.0)
Monocytes Absolute: 0.9 10*3/uL (ref 0.1–1.0)
Monocytes Relative: 7 %
Neutro Abs: 9.4 10*3/uL — ABNORMAL HIGH (ref 1.7–7.7)
Neutrophils Relative %: 79 %
Platelet Count: 484 10*3/uL — ABNORMAL HIGH (ref 150–400)
RBC: 3.31 MIL/uL — ABNORMAL LOW (ref 3.87–5.11)
RDW: 15.7 % — ABNORMAL HIGH (ref 11.5–15.5)
WBC Count: 12 10*3/uL — ABNORMAL HIGH (ref 4.0–10.5)
nRBC: 0 % (ref 0.0–0.2)

## 2022-06-15 LAB — CMP (CANCER CENTER ONLY)
ALT: 18 U/L (ref 0–44)
AST: 23 U/L (ref 15–41)
Albumin: 3.2 g/dL — ABNORMAL LOW (ref 3.5–5.0)
Alkaline Phosphatase: 121 U/L (ref 38–126)
Anion gap: 11 (ref 5–15)
BUN: 47 mg/dL — ABNORMAL HIGH (ref 8–23)
CO2: 29 mmol/L (ref 22–32)
Calcium: 9.4 mg/dL (ref 8.9–10.3)
Chloride: 96 mmol/L — ABNORMAL LOW (ref 98–111)
Creatinine: 1.89 mg/dL — ABNORMAL HIGH (ref 0.44–1.00)
GFR, Estimated: 27 mL/min — ABNORMAL LOW (ref >60)
Glucose, Bld: 109 mg/dL — ABNORMAL HIGH (ref 70–99)
Potassium: 4.1 mmol/L (ref 3.5–5.1)
Sodium: 136 mmol/L (ref 135–145)
Total Bilirubin: 0.3 mg/dL (ref 0.3–1.2)
Total Protein: 5.8 g/dL — ABNORMAL LOW (ref 6.5–8.1)

## 2022-06-15 LAB — LACTATE DEHYDROGENASE: LDH: 183 U/L (ref 98–192)

## 2022-06-15 MED ORDER — ZOLPIDEM TARTRATE 10 MG PO TABS: 10.0000 mg | ORAL_TABLET | Freq: Every evening | ORAL | 3 refills | Status: AC | PRN

## 2022-06-15 NOTE — Progress Notes (Signed)
Lengthy conversation with Ciani and her daughter, Amy regarding Hospice Services.  Evanell was saying she didn't like the idea of Hospice, even though Hospice services have already started. Discussed the many benefits of Hospice going forward-a nurse to check on her 2 x a week, Hospice Aides to help with personnel, making possible for her to stay @ [facility], Assisted Living just to name a few.  Pt is able to tolerate any treatments for her Amyloid per Dr. Lorenso Courier and she has several other serious health issues, substantial weight loss, no appetite, By end of conversation pt voiced understanding things better and is in agreement to keep Hospice. Amy, her daughter, grateful for all the information.

## 2022-06-18 LAB — KAPPA/LAMBDA LIGHT CHAINS
Kappa free light chain: 23.9 mg/L — ABNORMAL HIGH (ref 3.3–19.4)
Kappa, lambda light chain ratio: 0.56 (ref 0.26–1.65)
Lambda free light chains: 42.9 mg/L — ABNORMAL HIGH (ref 5.7–26.3)

## 2022-06-20 LAB — MULTIPLE MYELOMA PANEL, SERUM
Albumin SerPl Elph-Mcnc: 2.8 g/dL — ABNORMAL LOW (ref 2.9–4.4)
Albumin/Glob SerPl: 1.2 (ref 0.7–1.7)
Alpha 1: 0.3 g/dL (ref 0.0–0.4)
Alpha2 Glob SerPl Elph-Mcnc: 0.9 g/dL (ref 0.4–1.0)
B-Globulin SerPl Elph-Mcnc: 0.8 g/dL (ref 0.7–1.3)
Gamma Glob SerPl Elph-Mcnc: 0.4 g/dL (ref 0.4–1.8)
Globulin, Total: 2.4 g/dL (ref 2.2–3.9)
IgA: 115 mg/dL (ref 64–422)
IgG (Immunoglobin G), Serum: 432 mg/dL — ABNORMAL LOW (ref 586–1602)
IgM (Immunoglobulin M), Srm: 44 mg/dL (ref 26–217)
Total Protein ELP: 5.2 g/dL — ABNORMAL LOW (ref 6.0–8.5)

## 2022-06-21 ENCOUNTER — Encounter: Payer: Self-pay | Admitting: Hematology and Oncology
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
## 2022-06-25 ENCOUNTER — Telehealth: Payer: Self-pay | Admitting: *Deleted

## 2022-06-25 NOTE — Telephone Encounter (Signed)
TCT to patient's daughter, Darnell Level regarding her mom's recent lab results.  No answer but was able to leave detailed message on her identified vm . Advised that her mom's amyloid protein remains undetectable. But because of her overall condition and decline (for non related reasons), Dr. Lorenso Courier does not recommend resuming treatment. Advised that we are available at any time for questions, concerns. We can see her back in clinic at her request. Pt is currently receiving Hosp[facility] services at [facility].

## 2022-06-25 NOTE — Telephone Encounter (Signed)
-----   Message from Orson Slick, MD sent at 06/21/2022  2:31 PM EDT ----- Please let Wendy [name] and her daughter know that her amyloid protein is undetectable.  This is what we would anticipate given how well she responded to her initial treatment.  At this time I do think it is appropriate to continue holding maintenance Darzalex therapy.  I do not think he would make any meaningful change in her overall prognosis or lifespan moving forward.  We will continue to be available for her if she has any issues, questions or concerns.  We will see her back in clinic on an as-needed basis.  ----- Message ----- From: Interface, Lab In [place] Sent: 06/15/2022   3:49 PM EDT To: Orson Slick, MD

## 2022-07-09 ENCOUNTER — Encounter: Payer: Self-pay | Admitting: Hematology and Oncology

## 2022-07-11 ENCOUNTER — Encounter: Payer: Self-pay | Admitting: Hematology and Oncology

## 2022-07-16 ENCOUNTER — Other Ambulatory Visit: Payer: Self-pay

## 2022-07-24 ENCOUNTER — Other Ambulatory Visit: Payer: Self-pay

## 2022-08-09 ENCOUNTER — Ambulatory Visit: Payer: Self-pay

## 2022-08-09 NOTE — Chronic Care Management (AMB) (Signed)
Care Management    RN Visit Note  08/09/2022 Name: Wendy [name] MRN: [id number] DOB: [birth year]  Subjective: Wendy [name] is a 76 y.o. year old female who is a primary care patient of Tower, Wendy Fanny, MD. The care management team was consulted for assistance with disease management and care coordination needs.    follow up visit in response to provider referral for case management and/or care coordination services.   Consent to Services:   [name] was given information about Care Management services today including:  Care Management services includes personalized support from designated clinical staff supervised by her physician, including individualized plan of care and coordination with other care providers 24/7 contact phone numbers for assistance for urgent and routine care needs. The patient may stop case management services at any time by phone call to the office staff.  Patient agreed to services and consent obtained.   Assessment: Review of patient past medical history, allergies, medications, health status, including review of consultants reports, laboratory and other test data, was performed as part of comprehensive evaluation and provision of chronic care management services.   SDOH (Social Determinants of Health) assessments and interventions performed:    Care Plan  Allergies  Allergen Reactions   Bentyl [Dicyclomine Hcl] Other (See Comments)    Groggy, blurred vision   Butalbital-Aspirin-Caffeine Other (See Comments)    hallucinations   Clonidine Derivatives Other (See Comments)    dizziness, lightheadedness, abdominal cramping, dry mouth/throat   Linzess [Linaclotide] Diarrhea   Morphine And Related Other (See Comments)    Does not work   Motrin [Ibuprofen] Other (See Comments)    GI upset   Penicillins Other (See Comments)    As child; reaction unknown Has patient had a PCN reaction causing immediate rash, facial/tongue/throat swelling, SOB or  lightheadedness with hypotension: No Has patient had a PCN reaction causing severe rash involving mucus membranes or skin necrosis: No Has patient had a PCN reaction that required hospitalization: No Has patient had a PCN reaction occurring within the last 10 years: No If all of the above answers are "NO", then may proceed with Cephalosporin use.   Sulfa Antibiotics Other (See Comments)    In childhood   Zanaflex [Tizanidine Hcl] Other (See Comments)    Decreased BP   Diphenhydramine Hcl Palpitations    restlessness    Outpatient Encounter Medications as of 08/09/2022  Medication Sig   acetaminophen (TYLENOL) 325 MG tablet Take 2 tablets (650 mg total) by mouth every 6 (six) hours as needed for mild pain, fever or headache.   albuterol (VENTOLIN HFA) 108 (90 Base) MCG/ACT inhaler Inhale 2 puffs by mouth into the lungs every 6 hours as needed for wheezing or shortness of breath (Patient not taking: Reported on 06/15/2022)   alclomethasone (ACLOVATE) 0.05 % ointment apply topically to the lips once daily as needed (Patient taking differently: Apply 1 application. topically 2 (two) times daily as needed (eczema or rash).)   ALPRAZolam (XANAX) 0.25 MG tablet Take 0.25 mg by mouth 2 (two) times daily as needed for anxiety.   ascorbic acid (VITAMIN C) 500 MG tablet Take 1 tablet (500 mg total) by mouth 2 (two) times daily.   aspirin 81 MG chewable tablet Chew 1 tablet (81 mg total) by mouth daily.   atorvastatin (LIPITOR) 40 MG tablet Take 1 tablet (40 mg total) by mouth daily. (Patient taking differently: Take 40 mg by mouth every evening.)   Calcium Carbonate Antacid (TUMS PO) Take  2 tablets by mouth 4 (four) times daily as needed (heartburn/indigestion).   Cholecalciferol (VITAMIN D) 2000 UNITS tablet Take 2,000 Units by mouth daily.   clopidogrel (PLAVIX) 75 MG tablet Take 1 tablet (75 mg total) by mouth daily.   diphenoxylate-atropine (LOMOTIL) 2.5-0.025 MG tablet Take 1 tablet by mouth 4  (four) times daily as needed for diarrhea or loose stools.   docusate sodium (COLACE) 100 MG capsule Take 1 capsule (100 mg total) by mouth 2 (two) times daily.   furosemide (LASIX) 40 MG tablet Take 1 tablet (40 mg total) by mouth daily.   levETIRAcetam (KEPPRA) 100 MG/ML solution Take 5 mLs (500 mg total) by mouth 2 (two) times daily.   losartan (COZAAR) 25 MG tablet Take 1 tablet (25 mg total) by mouth daily.   Melatonin 10 MG TABS Take 10 mg by mouth at bedtime.   metoprolol succinate (TOPROL-XL) 50 MG 24 hr tablet Take 1 tablet (50 mg total) by mouth daily. Take with or immediately following a meal.   mirtazapine (REMERON) 15 MG tablet Take 15 mg by mouth at bedtime.   neomycin-bacitracin-polymyxin (NEOSPORIN) [phone] ointment Apply 1 application. topically See admin instructions. Cleanse right leg with soap and water, pat dry, apply TAO,cover with non adherent dressing qd until healed   pantoprazole sodium (PROTONIX) 40 mg Take 40 mg by mouth daily.   Polyethyl Glycol-Propyl Glycol (LUBRICANT EYE DROPS) 0.4-0.3 % SOLN Place 1 drop into both eyes daily.   polyethylene glycol powder (GLYCOLAX/MIRALAX) 17 GM/SCOOP powder Take 17 g by mouth daily as needed for mild constipation.   traMADol (ULTRAM) 50 MG tablet Take 1 tablet (50 mg total) by mouth every 12 (twelve) hours as needed for severe pain.   vitamin B-12 1000 MCG tablet Take 1 tablet (1,000 mcg total) by mouth daily.   zinc sulfate 220 (50 Zn) MG capsule Take 1 capsule (220 mg total) by mouth daily.   zolpidem (AMBIEN) 10 MG tablet Take 1 tablet (10 mg total) by mouth at bedtime as needed. for sleep   No facility-administered encounter medications on file as of 08/09/2022.    Patient Active Problem List   Diagnosis Date Noted   Pleural effusion    Acute on chronic heart failure with preserved ejection fraction (HFpEF) ([place]) 05/06/2022   Acute CVA (cerebrovascular accident) ([place]) 03/16/2022   Seizure ([place]) 03/16/2022    Protein-calorie malnutrition, severe 03/12/2022   Acute heart failure ([place])    Altered mental status    Acute respiratory failure ([place]) 03/11/2022   Pain in buttock 02/09/2022   Low back pain 02/09/2022   Poor balance 02/09/2022   History of fall 02/09/2022   Generalized weakness 02/09/2022   Stress reaction 12/24/2021   Current use of proton pump inhibitor 10/27/2021   Full code status 05/31/2021   Flash pulmonary edema ([place]) 04/30/2021   Heme positive stool 04/30/2021   Hyperkalemia 04/30/2021   Compression fracture of T10 vertebra ([place]) 03/14/2021   History of DVT (deep vein thrombosis) 03/13/2021   Multiple myeloma ([place]) 02/10/2021   Leucocytosis 02/10/2021   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   Situational anxiety 01/11/2021   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   Hyperlipidemia 10/19/2020   Hearing loss 06/14/2020   Pedal edema 06/01/2020   Aortic atherosclerosis ([place]) 04/06/2020   CAD (coronary artery disease) 04/06/2020   H/O compression fracture of spine 04/06/2020   Chronic back pain 04/06/2020   Abnormal CT scan of lung 03/17/2020   Heartburn 06/02/2018   Chronic constipation  12/31/2017   Blood glucose elevated 09/25/2017   History of ileus 06/07/2017   Right carpal tunnel syndrome 12/07/2016   Estrogen deficiency 09/24/2016   Routine general medical examination at a health care facility 09/04/2015   Colon cancer screening 12/11/2014   Encounter for Medicare annual wellness exam 05/17/2013   Osteoarthritis 03/28/2011   Degenerative disc disease, lumbar 03/28/2011   Hyponatremia 02/12/2011   Essential hypertension 08/01/2010   Osteoporosis 08/01/2010    Conditions to be addressed/monitored: Unable to re-establish contact. Case closed  Care Plan : [facility] Plan of care  Updates made by Dannielle Karvonen, RN since 08/09/2022 12:00 AM     Problem: Chronic disease management education and care coordination needs.   Priority: High     Long-Range Goal:  Development of plan of caer to address chronic disease managment and care coordination needs.   Start Date: 02/09/2022  Expected End Date: 04/20/2022  Priority: High  Note:   Unable to re-establish contact. Case closed.  Current Barriers:  Knowledge Deficits related to plan of care for management of HTN and Multiple Myeloma, falls   RNCM Clinical Goal(s):  Patient will verbalize understanding of plan for management of HTN and Multiple Myeloma, falls as evidenced by patient report and/ or notation in chart take all medications exactly as prescribed and will call provider for medication related questions as evidenced by patient report and /or notation in chart    attend all scheduled medical appointments:  as evidenced by patient report and / or notation in chart        continue to work with RN Care Manager and/or Social Worker to address care management and care coordination needs related to HTN and Multiple Myeloma, falls as evidenced by adherence to CM Team Scheduled appointments     work with Education officer, museum to address community resource needs.  collaboration with Consulting civil engineer, provider, and care team.   Interventions: 1:1 collaboration with primary care provider regarding development and update of comprehensive plan of care as evidenced by provider attestation and co-signature Inter-disciplinary care team collaboration (see longitudinal plan of care) Evaluation of current treatment plan related to  self management and patient's adherence to plan as established by provider   Multiple myeloma  (Status: Unable to re-establish contact. Goal closed) Evaluation of current treatment plan related to HTN and Multiple Myeloma, falls ,  self-management and patient's adherence to plan as established by provider. Discussed plans with patient for ongoing care management follow up and provided patient with direct contact information for care management team Advised to eat small frequent meals during the  day, manage fatigue, stay hydrated Medications reviewed and compliance discussed Advised to follow up with providers as recommended.  Hypertension Interventions:  (Status:  Unable to re-establish contact. Goal closed.  Last practice recorded BP readings:  BP Readings from Last 3 Encounters:  01/25/22 (!) 165/81  12/28/21 (!) 142/95  12/22/21 140/78  Most recent eGFR/CrCl: No results found for: EGFR  No components found for: CRCL  Evaluation of current treatment plan related to hypertension self management and patient's adherence to plan as established by provider Reviewed medications with patient and discussed importance of compliance Discussed plans with patient for ongoing care management follow up and provided patient with direct contact information for care management team Reviewed scheduled/upcoming provider appointments  Advised to consider monitoring blood pressure at home    Falls Interventions:  (Status:  Unable to re-establish contact. Goal closed) Provided written and verbal education re: potential causes  of falls and Fall prevention strategies Reviewed medications and discussed potential side effects of medications such as dizziness and frequent urination Advised patient of importance of notifying provider of falls Assessed patients knowledge of fall risk prevention secondary to previously provided education Discussed life alert system.  Patient offered education information.  Declined at this time.    Patient Goals/Self-Care Activities: Take medications as prescribed   Attend all scheduled provider appointments Call pharmacy for medication refills 3-7 days in advance of running out of medications Call provider office for new concerns or questions  Eat small frequent meals during the day, manage fatigue ( take rest breaks when needed), stay hydrated Follow a low salt diet.  Review fall prevention education article sent to you in my chart Keep walkways free of clutter,  remove throw rugs, keep areas in home well lit.  Use nightlight at night in room/ bathroom/ hallway Consider monitoring blood pressure at home 2-3 times per week and recording.        Plan: No further follow up required: Unable to re-establish contact.   Quinn Plowman RN,BSN,CCM RN Care Manager Coordinator [phone]

## 2022-09-05 ENCOUNTER — Encounter: Payer: Self-pay | Admitting: Family Medicine

## 2022-09-05 ENCOUNTER — Encounter: Payer: Self-pay | Admitting: Hematology and Oncology

## 2022-09-05 NOTE — Telephone Encounter (Signed)
Chart has been updated.

## 2022-09-23 DEATH — deceased
